# Patient Record
Sex: Female | Born: 1950 | Race: Black or African American | Hispanic: No | Marital: Single | Smoking: Former smoker
Health system: Southern US, Community
[De-identification: ages and names within clinical notes are randomized; demographics above are authoritative.]

## PROBLEM LIST (undated history)

## (undated) DIAGNOSIS — E119 Type 2 diabetes mellitus without complications: Secondary | ICD-10-CM

## (undated) DIAGNOSIS — I2510 Atherosclerotic heart disease of native coronary artery without angina pectoris: Secondary | ICD-10-CM

## (undated) DIAGNOSIS — Z973 Presence of spectacles and contact lenses: Secondary | ICD-10-CM

## (undated) DIAGNOSIS — K8020 Calculus of gallbladder without cholecystitis without obstruction: Secondary | ICD-10-CM

## (undated) DIAGNOSIS — D649 Anemia, unspecified: Secondary | ICD-10-CM

## (undated) DIAGNOSIS — G629 Polyneuropathy, unspecified: Secondary | ICD-10-CM

## (undated) DIAGNOSIS — F419 Anxiety disorder, unspecified: Secondary | ICD-10-CM

## (undated) DIAGNOSIS — I509 Heart failure, unspecified: Secondary | ICD-10-CM

## (undated) DIAGNOSIS — I429 Cardiomyopathy, unspecified: Secondary | ICD-10-CM

## (undated) DIAGNOSIS — I10 Essential (primary) hypertension: Secondary | ICD-10-CM

## (undated) DIAGNOSIS — J449 Chronic obstructive pulmonary disease, unspecified: Secondary | ICD-10-CM

## (undated) DIAGNOSIS — M14672 Charcot's joint, left ankle and foot: Secondary | ICD-10-CM

## (undated) DIAGNOSIS — G4733 Obstructive sleep apnea (adult) (pediatric): Secondary | ICD-10-CM

## (undated) DIAGNOSIS — I82409 Acute embolism and thrombosis of unspecified deep veins of unspecified lower extremity: Secondary | ICD-10-CM

## (undated) DIAGNOSIS — N186 End stage renal disease: Secondary | ICD-10-CM

## (undated) DIAGNOSIS — T4145XA Adverse effect of unspecified anesthetic, initial encounter: Secondary | ICD-10-CM

## (undated) DIAGNOSIS — A0472 Enterocolitis due to Clostridium difficile, not specified as recurrent: Secondary | ICD-10-CM

## (undated) DIAGNOSIS — Z95810 Presence of automatic (implantable) cardiac defibrillator: Secondary | ICD-10-CM

## (undated) HISTORY — PX: CORONARY ANGIOPLASTY: SHX604

---

## 2016-02-23 LAB — BASIC METABOLIC PANEL
BUN: 43 mg/dL — AB (ref 4–21)
CREATININE: 1.8 mg/dL — AB (ref 0.5–1.1)
Glucose: 117 mg/dL
POTASSIUM: 3.9 mmol/L (ref 3.4–5.3)
Sodium: 141 mmol/L (ref 137–147)

## 2016-02-24 LAB — CBC AND DIFFERENTIAL
HEMATOCRIT: 26 % — AB (ref 36–46)
Hemoglobin: 8.1 g/dL — AB (ref 12.0–16.0)
Platelets: 248 10*3/uL (ref 150–399)
WBC: 7.2 10^3/mL

## 2016-02-24 LAB — BASIC METABOLIC PANEL
BUN: 40 mg/dL — AB (ref 4–21)
Creatinine: 2 mg/dL — AB (ref 0.5–1.1)
GLUCOSE: 109 mg/dL
POTASSIUM: 3.7 mmol/L (ref 3.4–5.3)
SODIUM: 140 mmol/L (ref 137–147)

## 2016-03-02 ENCOUNTER — Encounter: Payer: Self-pay | Admitting: Internal Medicine

## 2016-03-02 ENCOUNTER — Non-Acute Institutional Stay (SKILLED_NURSING_FACILITY): Payer: Medicare PPO | Admitting: Internal Medicine

## 2016-03-02 DIAGNOSIS — R319 Hematuria, unspecified: Secondary | ICD-10-CM | POA: Diagnosis not present

## 2016-03-02 DIAGNOSIS — R5381 Other malaise: Secondary | ICD-10-CM | POA: Diagnosis not present

## 2016-03-02 DIAGNOSIS — N179 Acute kidney failure, unspecified: Secondary | ICD-10-CM

## 2016-03-02 DIAGNOSIS — I82401 Acute embolism and thrombosis of unspecified deep veins of right lower extremity: Secondary | ICD-10-CM | POA: Diagnosis not present

## 2016-03-02 DIAGNOSIS — E119 Type 2 diabetes mellitus without complications: Secondary | ICD-10-CM | POA: Diagnosis not present

## 2016-03-02 DIAGNOSIS — I5022 Chronic systolic (congestive) heart failure: Secondary | ICD-10-CM | POA: Insufficient documentation

## 2016-03-02 DIAGNOSIS — I255 Ischemic cardiomyopathy: Secondary | ICD-10-CM | POA: Diagnosis not present

## 2016-03-02 DIAGNOSIS — I2510 Atherosclerotic heart disease of native coronary artery without angina pectoris: Secondary | ICD-10-CM | POA: Insufficient documentation

## 2016-03-02 DIAGNOSIS — E669 Obesity, unspecified: Secondary | ICD-10-CM | POA: Diagnosis not present

## 2016-03-02 DIAGNOSIS — N611 Abscess of the breast and nipple: Secondary | ICD-10-CM | POA: Diagnosis not present

## 2016-03-02 DIAGNOSIS — R600 Localized edema: Secondary | ICD-10-CM

## 2016-03-02 DIAGNOSIS — I10 Essential (primary) hypertension: Secondary | ICD-10-CM | POA: Diagnosis not present

## 2016-03-02 DIAGNOSIS — E1169 Type 2 diabetes mellitus with other specified complication: Secondary | ICD-10-CM | POA: Insufficient documentation

## 2016-03-02 NOTE — Progress Notes (Signed)
LOCATION: [facility]  PCP: No primary care provider on file.   Code Status: Full Code  Goals of care: Advanced Directive information No flowsheet data found.   No emergency contact information on file.   Allergies  Allergen Reactions  . Entresto [Sacubitril-Valsartan]     Chief Complaint  Patient presents with  . New Admit To SNF    New Admission     HPI:  Patient is a 65 y.o. female seen today for short term rehabilitation post hospital admission from 02/06/16-02/27/16 with CHF exacerbation. She was diuresed following which she had acute kidney injury and became hypotensive. Her echocardiogram showed EF of 10-15%. She required emergent dialysis in the hospital with nephrology involved. She had a vascath placed for dialysis. She had hematuria and urology was consulted and this was thought to be from trauma from foley. She required pressor support for low blood pressure. She was seen by palliative care in the hospital with her overall poor prognosis. She had her palvix and aspirin held with hematuria. Later she was noted to have left arm swelling and DVT was ruled out. 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 She was started on coumadin. She also had abscess of right breast and underwent I&D. She was started on antibiotics. She is seen in her room today.   Review of Systems:  Constitutional: Negative for fever, chills, diaphoresis. Feels weak and tired.  HENT: Negative for headache, congestion, nasal discharge, sore throat, difficulty swallowing.   Eyes: Negative for blurred vision, double vision and discharge.  Respiratory: Negative for wheezing. Positive for dry cough and shortness of breath when lying down and with minimal exertion.   Cardiovascular: Negative for chest pain. Positive for ocassional palpitations and leg swelling.   Gastrointestinal: Negative for heartburn, nausea, vomiting, abdominal pain. Positive for stomach feeling tight and bloated. Last bowel  movement was last night.  Genitourinary: Negative for dysuria and flank pain.  Musculoskeletal: Negative for back pain, fall.  Skin: Negative for itching, rash.  Neurological: Negative for dizziness. Psychiatric/Behavioral: Negative for depression  PMH- DM, HLD, obesity, OSA, CAD, systolic CHF, moderate pulmonary HTN  PSH- reviewed, see discharge paperwork  History reviewed. No pertinent family history.  Medications:   Medication List       This list is accurate as of: 03/02/16 12:15 PM.  Always use your most recent med list.               albuterol 108 (90 Base) MCG/ACT inhaler  Commonly known as:  PROVENTIL HFA;VENTOLIN HFA  Inhale 2 puffs into the lungs 4 (four) times daily as needed for wheezing or shortness of breath.     atorvastatin 10 MG tablet  Commonly known as:  LIPITOR  Take 10 mg by mouth daily.     beclomethasone 80 MCG/ACT inhaler  Commonly known as:  QVAR  Inhale 2 puffs into the lungs 2 (two) times daily.     carvedilol 12.5 MG tablet  Commonly known as:  COREG  Take 12.5 mg by mouth 2 (two) times daily.     clindamycin 300 MG capsule  Commonly known as:  CLEOCIN  Take 300 mg by mouth every 6 (six) hours.     FIBER CHOICE 1.5 g Chew  Generic drug:  Inulin  Chew 2 tablets by mouth daily as needed.     insulin glargine 100 UNIT/ML injection  Commonly known as:  LANTUS  Inject 18 Units into the skin at bedtime.  torsemide 100 MG tablet  Commonly known as:  DEMADEX  Take 100 mg by mouth daily.     torsemide 100 MG tablet  Commonly known as:  DEMADEX  Take 50 mg by mouth. Every afternoon     warfarin 5 MG tablet  Commonly known as:  COUMADIN  Take 5 mg by mouth daily.        Immunizations: Immunization History  Administered Date(s) Administered  . PPD Test 03/01/2016     Physical Exam: Filed Vitals:   03/02/16 1136  BP: 119/69  Pulse: 83  Temp: 98.4 F (36.9 C)  TempSrc: Oral  Resp: 20  SpO2: 97%    General- elderly  female, morbidly obese, in no acute distress, short of breath in between sentences Head- normocephalic, atraumatic Nose-  no maxillary or frontal sinus tenderness, no nasal discharge Throat- moist mucus membrane, upper dentures, poor lower dentition Eyes- PERRLA, EOMI, no pallor, no icterus, no discharge, normal conjunctiva, normal sclera Neck- no cervical lymphadenopathy Cardiovascular- normal s1,s2, no murmur, 2+ leg edema Respiratory- bilateral poor air entry, no wheeze, no rhonchi, no crackles, no use of accessory muscles Abdomen- bowel sounds present, soft, non tender Musculoskeletal- able to move all 4 extremities, generalized weakness Neurological- alert and oriented to person, place and time Skin- warm and dry, dressing to lower inner quadrant of right breast appears clean and dry Psychiatry- normal mood and affect    Labs reviewed: Basic Metabolic Panel:  Recent Labs  16/10/96 02/24/16  NA 141 140  K 3.9 3.7  BUN 43* 40*  CREATININE 1.8* 2.0*    CBC:  Recent Labs  02/24/16  WBC 7.2  HGB 8.1*  HCT 26*  PLT 248    Radiological Exams: 3/17 US Extremity Non Vasc LTD Impression: 1. Developing subcutaneous abscess in the right anterior chest/upper abdominal wall underneath the medial right breast measuring 3.6 x 2.9 x 1.8 cm.   Chest Xray Shows AICD , a right central line present and without evidence of pneumothorax.  Ultrasound of abdomen, shows distended IVC and hepatic veins secondary to cardiac disease.  Transthoracic echocardiogram, shows moderate to severely reduced left ventricular systolic function, ejection fraction of 22% with right ventricular enlargement, left atrial enlargement, right atrial enlargement, severe TR and mild to moderate MR.  Assessment/Plan  Physical deconditioning Will have her work with physical therapy and occupational therapy team to help with gait training and muscle strengthening exercises.fall precautions. Skin care. Encourage  to be out of bed.   Systolic CHF exacerbation S/p diuresis. Continue demadex 100 mg in am and 50 mg in pm. Continue coreg 12.5 mg bid. Monitor daily weight  Leg edema Add ted hose to help with edema  Right breast abscess S/p Incision and drainage. Continue clindamycin 300 mg qid x 1 week. Monitor cbc with diff and temp curve  Hematuria Off aspirin and plavix for now. Will need urology follow up appointment  Acute renal impairment With cardiorenal syndrome. S/p dialysis in hospital. Monitor bmp  Right IJ thrombosis Continue coumadin. INR goal inr 2-3  HTN Stable bp, monitor bp bid for now. Continue coreg  Ischemic cardiomyopathy S/p AICD. Continue coreg and torsemide.  DM Check a1c. Monitor cbg tid with meals and at bedtime. Continue lantus and add novolog 5 u tid with meals for cbg > 180  CAD Remains chest pain free. Continue coreg, statin and monitor   Goals of care: short term rehabilitation   Labs/tests ordered: cbc, cmp  Family/ staff Communication: reviewed care plan  with patient and nursing supervisor    Blanchie Serve, MD Internal Medicine [place], [place] [address] Cell Phone (Monday-Friday 8 am - 5 pm): [phone] On Call: [phone] and follow prompts after 5 pm and on weekends Office Phone: [phone] Office Fax: [phone]

## 2016-03-03 ENCOUNTER — Emergency Department (HOSPITAL_COMMUNITY)
Admission: EM | Admit: 2016-03-03 | Discharge: 2016-03-04 | Disposition: A | Discharge status: Home / Self Care | Payer: Medicare PPO | Attending: Emergency Medicine | Admitting: Emergency Medicine

## 2016-03-03 ENCOUNTER — Encounter (HOSPITAL_COMMUNITY): Payer: Self-pay | Admitting: Emergency Medicine

## 2016-03-03 DIAGNOSIS — Z7901 Long term (current) use of anticoagulants: Secondary | ICD-10-CM | POA: Diagnosis not present

## 2016-03-03 DIAGNOSIS — Z79899 Other long term (current) drug therapy: Secondary | ICD-10-CM | POA: Diagnosis not present

## 2016-03-03 DIAGNOSIS — I5022 Chronic systolic (congestive) heart failure: Secondary | ICD-10-CM | POA: Insufficient documentation

## 2016-03-03 DIAGNOSIS — Z794 Long term (current) use of insulin: Secondary | ICD-10-CM | POA: Diagnosis not present

## 2016-03-03 DIAGNOSIS — Z7951 Long term (current) use of inhaled steroids: Secondary | ICD-10-CM | POA: Insufficient documentation

## 2016-03-03 DIAGNOSIS — K219 Gastro-esophageal reflux disease without esophagitis: Secondary | ICD-10-CM | POA: Insufficient documentation

## 2016-03-03 DIAGNOSIS — J441 Chronic obstructive pulmonary disease with (acute) exacerbation: Secondary | ICD-10-CM | POA: Diagnosis not present

## 2016-03-03 DIAGNOSIS — I10 Essential (primary) hypertension: Secondary | ICD-10-CM | POA: Diagnosis not present

## 2016-03-03 DIAGNOSIS — Z87891 Personal history of nicotine dependence: Secondary | ICD-10-CM | POA: Diagnosis not present

## 2016-03-03 DIAGNOSIS — Z792 Long term (current) use of antibiotics: Secondary | ICD-10-CM | POA: Insufficient documentation

## 2016-03-03 DIAGNOSIS — I2510 Atherosclerotic heart disease of native coronary artery without angina pectoris: Secondary | ICD-10-CM | POA: Diagnosis not present

## 2016-03-03 DIAGNOSIS — R12 Heartburn: Secondary | ICD-10-CM | POA: Diagnosis present

## 2016-03-03 HISTORY — DX: Chronic obstructive pulmonary disease, unspecified: J44.9

## 2016-03-03 HISTORY — DX: Atherosclerotic heart disease of native coronary artery without angina pectoris: I25.10

## 2016-03-03 HISTORY — DX: Heart failure, unspecified: I50.9

## 2016-03-03 HISTORY — DX: Essential (primary) hypertension: I10

## 2016-03-03 NOTE — ED Notes (Signed)
PER [facility]: Patient to ED from [facility] c/o increased SOB, productive cough, and heartburn/indigestion off and on today. Patient was discharged from [facility] in [place] Wednesday following a 3 week admission for CHF exacerbation and R breast mass, in which she's on antibiotics for currently. EMS was called out to SNF after pt c/o burning sensation, indigestion, and emesis x 1 from eating a hotdog. Pt was receiving 3-4 neb treatments/day at [facility]. Pt received 325 ASA PTA. EMS gave total 10mg  Albuterol, 0.5mg  Atrovent, 125mg  Solumedrol, and 4mg  zofran. EMS VS: 114/76, HR 76 NSR, 97% on 8L O2. Pt has significant hx: CHF, COPD, OSA, CAD. Pt A&O x 4.

## 2016-03-04 ENCOUNTER — Emergency Department (HOSPITAL_COMMUNITY): Payer: Medicare PPO

## 2016-03-04 DIAGNOSIS — K219 Gastro-esophageal reflux disease without esophagitis: Secondary | ICD-10-CM | POA: Diagnosis not present

## 2016-03-04 LAB — CBC WITH DIFFERENTIAL/PLATELET
Basophils Absolute: 0 10*3/uL (ref 0.0–0.1)
Basophils Relative: 1 %
EOS PCT: 1 %
Eosinophils Absolute: 0.1 10*3/uL (ref 0.0–0.7)
HCT: 28.4 % — ABNORMAL LOW (ref 36.0–46.0)
Hemoglobin: 8.7 g/dL — ABNORMAL LOW (ref 12.0–15.0)
LYMPHS ABS: 1.1 10*3/uL (ref 0.7–4.0)
LYMPHS PCT: 15 %
MCH: 26.9 pg (ref 26.0–34.0)
MCHC: 30.6 g/dL (ref 30.0–36.0)
MCV: 87.9 fL (ref 78.0–100.0)
MONOS PCT: 7 %
Monocytes Absolute: 0.5 10*3/uL (ref 0.1–1.0)
Neutro Abs: 5.7 10*3/uL (ref 1.7–7.7)
Neutrophils Relative %: 76 %
PLATELETS: 298 10*3/uL (ref 150–400)
RBC: 3.23 MIL/uL — AB (ref 3.87–5.11)
RDW: 19.8 % — ABNORMAL HIGH (ref 11.5–15.5)
WBC: 7.5 10*3/uL (ref 4.0–10.5)

## 2016-03-04 LAB — COMPREHENSIVE METABOLIC PANEL
ALK PHOS: 207 U/L — AB (ref 38–126)
ALT: 15 U/L (ref 14–54)
AST: 43 U/L — ABNORMAL HIGH (ref 15–41)
Albumin: 2.9 g/dL — ABNORMAL LOW (ref 3.5–5.0)
Anion gap: 11 (ref 5–15)
BUN: 31 mg/dL — ABNORMAL HIGH (ref 6–20)
CALCIUM: 9 mg/dL (ref 8.9–10.3)
CO2: 27 mmol/L (ref 22–32)
CREATININE: 1.49 mg/dL — AB (ref 0.44–1.00)
Chloride: 105 mmol/L (ref 101–111)
GFR, EST AFRICAN AMERICAN: 41 mL/min — AB (ref >60)
GFR, EST NON AFRICAN AMERICAN: 36 mL/min — AB (ref >60)
Glucose, Bld: 139 mg/dL — ABNORMAL HIGH (ref 65–99)
Potassium: 4.7 mmol/L (ref 3.5–5.1)
Sodium: 143 mmol/L (ref 135–145)
Total Bilirubin: 2.3 mg/dL — ABNORMAL HIGH (ref 0.3–1.2)
Total Protein: 6.9 g/dL (ref 6.5–8.1)

## 2016-03-04 LAB — I-STAT TROPONIN, ED: TROPONIN I, POC: 0.04 ng/mL (ref 0.00–0.08)

## 2016-03-04 LAB — LIPASE, BLOOD: LIPASE: 44 U/L (ref 11–51)

## 2016-03-04 MED ORDER — OMEPRAZOLE 20 MG PO CPDR: 20.0000 mg | DELAYED_RELEASE_CAPSULE | Freq: Every day | ORAL | Status: DC

## 2016-03-04 NOTE — ED Provider Notes (Signed)
CSN: [id number]     Arrival date & time 03/03/16  2310 History   First MD Initiated Contact with Patient 03/04/16 0001     Chief Complaint  Patient presents with  . Shortness of Breath  . Heartburn     (Consider location/radiation/quality/duration/timing/severity/associated sxs/prior Treatment) HPI   Patient with PMH of CHF, COPD and hypertension coming from SNF at [facility] for evaluation of having heartburn, belching, coughing after eating a hot dog frank. She states that she normal doesn't eat that stuff and since then she has been burping up "pork rind" taste. She was recently discharged from [facility] where she stayed for 3 weeks after a complicated stay for CHF and breast abscess. She is on abx. They gave her a breathing treatment, solumedrol, zofran and aspirin en route. She is sitting comfortably. Denies having any further discomfort aside from belching. She speaks in full sentences with O2 sats at 98% on room air.  Past Medical History  Diagnosis Date  . Coronary artery disease   . CHF (congestive heart failure) ([place])   . COPD (chronic obstructive pulmonary disease) ([place])   . Hypertension    No past surgical history on file. No family history on file. Social History  Substance Use Topics  . Smoking status: Former Smoker    Types: Cigarettes    Quit date: 03/03/2006  . Smokeless tobacco: Never Used  . Alcohol Use: No   OB History    No data available     Review of Systems  Review of Systems All other systems negative except as documented in the HPI. All pertinent positives and negatives as reviewed in the HPI.   Allergies  Entresto  Home Medications   Prior to Admission medications   Medication Sig Start Date End Date Taking? Authorizing Provider  albuterol (PROVENTIL HFA;VENTOLIN HFA) 108 (90 Base) MCG/ACT inhaler Inhale 2 puffs into the lungs 4 (four) times daily as needed for wheezing or shortness of breath.   Yes Historical Provider, MD   atorvastatin (LIPITOR) 10 MG tablet Take 10 mg by mouth daily.   Yes Historical Provider, MD  beclomethasone (QVAR) 80 MCG/ACT inhaler Inhale 2 puffs into the lungs 2 (two) times daily.   Yes Historical Provider, MD  carvedilol (COREG) 12.5 MG tablet Take 12.5 mg by mouth 2 (two) times daily.   Yes Historical Provider, MD  clindamycin (CLEOCIN) 300 MG capsule Take 300 mg by mouth every 6 (six) hours.   Yes Historical Provider, MD  insulin glargine (LANTUS) 100 UNIT/ML injection Inject 18 Units into the skin at bedtime.   Yes Historical Provider, MD  insulin lispro (HUMALOG) 100 UNIT/ML injection Inject 5 Units into the skin 3 (three) times daily before meals. For CBG > 180   Yes Historical Provider, MD  Inulin (FIBER CHOICE) 1.5 g CHEW Chew 2 tablets by mouth daily as needed (for constipation).    Yes Historical Provider, MD  torsemide (DEMADEX) 100 MG tablet Take 50-100 mg by mouth 2 (two) times daily. 100 mg every morning and 50 mg Every afternoon   Yes Historical Provider, MD  warfarin (COUMADIN) 5 MG tablet Take 5 mg by mouth daily.   Yes Historical Provider, MD  omeprazole (PRILOSEC) 20 MG capsule Take 1 capsule (20 mg total) by mouth daily. 03/04/16   Sharry Beining Carlota Raspberry, PA-C   BP 102/65 mmHg  Pulse 66  Temp(Src) 98.2 F (36.8 C) (Oral)  Resp 25  Ht _0  (1.626 m)  Wt 117.935 kg  BMI 44.61 kg/m2  SpO2 98% Physical Exam  Constitutional: She appears well-developed and well-nourished. No distress.  HENT:  Head: Normocephalic and atraumatic.  Right Ear: Tympanic membrane and ear canal normal.  Left Ear: Tympanic membrane and ear canal normal.  Nose: Nose normal.  Mouth/Throat: Uvula is midline, oropharynx is clear and moist and mucous membranes are normal.  Eyes: Pupils are equal, round, and reactive to light.  Neck: Normal range of motion. Neck supple.  Cardiovascular: Normal rate and regular rhythm.   Pulmonary/Chest: Effort normal and breath sounds normal. No accessory muscle usage.  No respiratory distress. She has no decreased breath sounds.  Abdominal: Soft. Bowel sounds are normal. There is no tenderness. There is no rigidity and no guarding.  No signs of abdominal distention  Musculoskeletal:  No LE swelling  Neurological: She is alert.  Acting at baseline  Skin: Skin is warm and dry. No rash noted.  Nursing note and vitals reviewed.   ED Course  Procedures (including critical care time) Labs Review Labs Reviewed  CBC WITH DIFFERENTIAL/PLATELET - Abnormal; Notable for the following:    RBC 3.23 (*)    Hemoglobin 8.7 (*)    HCT 28.4 (*)    RDW 19.8 (*)    All other components within normal limits  COMPREHENSIVE METABOLIC PANEL - Abnormal; Notable for the following:    Glucose, Bld 139 (*)    BUN 31 (*)    Creatinine, Ser 1.49 (*)    Albumin 2.9 (*)    AST 43 (*)    Alkaline Phosphatase 207 (*)    Total Bilirubin 2.3 (*)    GFR calc non Af Amer 36 (*)    GFR calc Af Amer 41 (*)    All other components within normal limits  LIPASE, BLOOD  I-STAT TROPOININ, ED    Imaging Review Dg Chest 2 View  03/04/2016  CLINICAL DATA:  Shortness of breath and productive cough. Recent hospital admission, discharged 2 days prior at an outside facility. EXAM: CHEST  2 VIEW COMPARISON:  None. FINDINGS: Single lead pacemaker in place. There is marked cardiomegaly. Small bilateral pleural effusions. Vascular congestion and question of minimal perihilar edema. No confluent airspace disease. Chronic deformity of both shoulders. Body habitus limits evaluation. IMPRESSION: Cardiomegaly with bilateral pleural effusions, vascular congestion and question perihilar edema. Findings consistent with CHF. No prior exams for comparison. Electronically Signed   By: Jeb Levering M.D.   On: 03/04/2016 01:00   I have personally reviewed and evaluated these images and lab results as part of my medical decision-making.   EKG Interpretation   Date/Time:  Friday March 03 2016 23:36:14  EDT Ventricular Rate:  73 PR Interval:  154 QRS Duration: 88 QT Interval:  407 QTC Calculation: 448 R Axis:   93 Text Interpretation:  Sinus rhythm Low voltage, extremity and precordial  leads Consider anterior infarct Nonspecific T abnormalities, lateral leads  Confirmed by BEATON  MD, ROBERT ([id number]) on 03/04/2016 12:05:56 AM      MDM   Final diagnoses:  Gastroesophageal reflux disease, esophagitis presence not specified  Chronic systolic congestive heart failure (HCC)    Dr. Audie Pinto has seen the patient as well. The plan is to check EKG (which is non acute), chest xray, blood work and a Troponin. If all of her labs are normal and she continues to look and feel well then she can be discharged back to the facility with PPI medication.  The patient has a negative Troponin. She brought  lab work from her visit on 3/24 to which her BUN has improved from 94, her creatinine has improved from 3.35, alk phos 140. Patient is requesting to leave as she feels better and feels that this is heart burn. With her labs being stable and she feels well, will dc with follow-up by the PCP. Dr. Audie Pinto agrees with this plan.  Delos Haring, PA-C 03/04/16 0131  Leonard Schwartz, MD 03/11/16 [phone]

## 2016-03-04 NOTE — Discharge Instructions (Signed)
Heartburn °Heartburn is a type of pain or discomfort that can happen in the throat or chest. It is often described as a burning pain. It may also cause a bad taste in the mouth. Heartburn may feel worse when you lie down or bend over, and it is often worse at night. Heartburn may be caused by stomach contents that move back up into the esophagus (reflux). °HOME CARE INSTRUCTIONS °Take these actions to decrease your discomfort and to help avoid complications. °Diet °· Follow a diet as recommended by your health care provider. This may involve avoiding foods and drinks such as: °¨ Coffee and tea (with or without caffeine). °¨ Drinks that contain alcohol. °¨ Energy drinks and sports drinks. °¨ Carbonated drinks or sodas. °¨ Chocolate and cocoa. °¨ Peppermint and mint flavorings. °¨ Garlic and onions. °¨ Horseradish. °¨ Spicy and acidic foods, including peppers, chili powder, curry powder, vinegar, hot sauces, and barbecue sauce. °¨ Citrus fruit juices and citrus fruits, such as oranges, lemons, and limes. °¨ Tomato-based foods, such as red sauce, chili, salsa, and pizza with red sauce. °¨ Fried and fatty foods, such as donuts, french fries, potato chips, and high-fat dressings. °¨ High-fat meats, such as hot dogs and fatty cuts of red and white meats, such as rib eye steak, sausage, ham, and bacon. °¨ High-fat dairy items, such as whole milk, butter, and cream cheese. °· Eat small, frequent meals instead of large meals. °· Avoid drinking large amounts of liquid with your meals. °· Avoid eating meals during the 2-3 hours before bedtime. °· Avoid lying down right after you eat. °· Do not exercise right after you eat. ° General Instructions  °· Pay attention to any changes in your symptoms. °· Take over-the-counter and prescription medicines only as told by your health care provider. Do not take aspirin, ibuprofen, or other NSAIDs unless your health care provider told you to do so. °· Do not use any tobacco products,  including cigarettes, chewing tobacco, and e-cigarettes. If you need help quitting, ask your health care provider. °· Wear loose-fitting clothing. Do not wear anything tight around your waist that causes pressure on your abdomen. °· Raise (elevate) the head of your bed about 6 inches (15 cm). °· Try to reduce your stress, such as with yoga or meditation. If you need help reducing stress, ask your health care provider. °· If you are overweight, reduce your weight to an amount that is healthy for you. Ask your health care provider for guidance about a safe weight loss goal. °· Keep all follow-up visits as told by your health care provider. This is important. °SEEK MEDICAL CARE IF: °· You have new symptoms. °· You have unexplained weight loss. °· You have difficulty swallowing, or it hurts to swallow. °· You have wheezing or a persistent cough. °· Your symptoms do not improve with treatment. °· You have frequent heartburn for more than two weeks. °SEEK IMMEDIATE MEDICAL CARE IF: °· You have pain in your arms, neck, jaw, teeth, or back. °· You feel sweaty, dizzy, or light-headed. °· You have chest pain or shortness of breath. °· You vomit and your vomit looks like blood or coffee grounds. °· Your stool is bloody or black. °  °This information is not intended to replace advice given to you by your health care provider. Make sure you discuss any questions you have with your health care provider. °  °Document Released: 04/01/2009 Document Revised: 08/04/2015 Document Reviewed: 03/10/2015 °Elsevier Interactive Patient Education ©2016 [employer] ° °

## 2016-03-04 NOTE — ED Notes (Signed)
PA-C at bedside 

## 2016-03-04 NOTE — ED Notes (Signed)
Patient verbalized understanding of discharge instructions and denies any further needs or questions at this time. VS stable. Patient ambulatory with steady gait. Awaiting [facility] to take patient back to [facility].

## 2016-03-14 ENCOUNTER — Emergency Department (HOSPITAL_COMMUNITY)
Admission: EM | Admit: 2016-03-14 | Discharge: 2016-03-14 | Disposition: A | Discharge status: Home / Self Care | Payer: Medicare PPO | Attending: Emergency Medicine | Admitting: Emergency Medicine

## 2016-03-14 ENCOUNTER — Encounter (HOSPITAL_COMMUNITY): Payer: Self-pay

## 2016-03-14 ENCOUNTER — Emergency Department (HOSPITAL_COMMUNITY): Payer: Medicare PPO

## 2016-03-14 DIAGNOSIS — Z79899 Other long term (current) drug therapy: Secondary | ICD-10-CM | POA: Insufficient documentation

## 2016-03-14 DIAGNOSIS — N289 Disorder of kidney and ureter, unspecified: Secondary | ICD-10-CM | POA: Insufficient documentation

## 2016-03-14 DIAGNOSIS — Z87891 Personal history of nicotine dependence: Secondary | ICD-10-CM | POA: Diagnosis not present

## 2016-03-14 DIAGNOSIS — Z7901 Long term (current) use of anticoagulants: Secondary | ICD-10-CM | POA: Diagnosis not present

## 2016-03-14 DIAGNOSIS — I2510 Atherosclerotic heart disease of native coronary artery without angina pectoris: Secondary | ICD-10-CM | POA: Diagnosis not present

## 2016-03-14 DIAGNOSIS — E669 Obesity, unspecified: Secondary | ICD-10-CM | POA: Diagnosis not present

## 2016-03-14 DIAGNOSIS — I10 Essential (primary) hypertension: Secondary | ICD-10-CM | POA: Diagnosis not present

## 2016-03-14 DIAGNOSIS — I5022 Chronic systolic (congestive) heart failure: Secondary | ICD-10-CM | POA: Diagnosis not present

## 2016-03-14 DIAGNOSIS — R0602 Shortness of breath: Secondary | ICD-10-CM | POA: Diagnosis present

## 2016-03-14 DIAGNOSIS — J449 Chronic obstructive pulmonary disease, unspecified: Secondary | ICD-10-CM | POA: Diagnosis not present

## 2016-03-14 DIAGNOSIS — J90 Pleural effusion, not elsewhere classified: Secondary | ICD-10-CM | POA: Insufficient documentation

## 2016-03-14 DIAGNOSIS — Z794 Long term (current) use of insulin: Secondary | ICD-10-CM | POA: Insufficient documentation

## 2016-03-14 LAB — I-STAT CHEM 8, ED
BUN: 39 mg/dL — AB (ref 6–20)
CALCIUM ION: 1.08 mmol/L — AB (ref 1.13–1.30)
CREATININE: 2 mg/dL — AB (ref 0.44–1.00)
Chloride: 99 mmol/L — ABNORMAL LOW (ref 101–111)
Glucose, Bld: 106 mg/dL — ABNORMAL HIGH (ref 65–99)
HCT: 35 % — ABNORMAL LOW (ref 36.0–46.0)
HEMOGLOBIN: 11.9 g/dL — AB (ref 12.0–15.0)
Potassium: 3.6 mmol/L (ref 3.5–5.1)
SODIUM: 141 mmol/L (ref 135–145)
TCO2: 29 mmol/L (ref 0–100)

## 2016-03-14 LAB — BASIC METABOLIC PANEL
ANION GAP: 10 (ref 5–15)
BUN: 40 mg/dL — ABNORMAL HIGH (ref 6–20)
CALCIUM: 8.2 mg/dL — AB (ref 8.9–10.3)
CO2: 27 mmol/L (ref 22–32)
Chloride: 100 mmol/L — ABNORMAL LOW (ref 101–111)
Creatinine, Ser: 2.13 mg/dL — ABNORMAL HIGH (ref 0.44–1.00)
GFR calc Af Amer: 27 mL/min — ABNORMAL LOW (ref >60)
GFR, EST NON AFRICAN AMERICAN: 23 mL/min — AB (ref >60)
GLUCOSE: 107 mg/dL — AB (ref 65–99)
Potassium: 3.7 mmol/L (ref 3.5–5.1)
SODIUM: 137 mmol/L (ref 135–145)

## 2016-03-14 LAB — CBC WITH DIFFERENTIAL/PLATELET
BASOS ABS: 0 10*3/uL (ref 0.0–0.1)
BASOS PCT: 0 %
EOS ABS: 0.2 10*3/uL (ref 0.0–0.7)
EOS PCT: 1 %
HCT: 30.2 % — ABNORMAL LOW (ref 36.0–46.0)
Hemoglobin: 9.3 g/dL — ABNORMAL LOW (ref 12.0–15.0)
Lymphocytes Relative: 5 %
Lymphs Abs: 0.6 10*3/uL — ABNORMAL LOW (ref 0.7–4.0)
MCH: 27.5 pg (ref 26.0–34.0)
MCHC: 30.8 g/dL (ref 30.0–36.0)
MCV: 89.3 fL (ref 78.0–100.0)
MONO ABS: 1.2 10*3/uL — AB (ref 0.1–1.0)
Monocytes Relative: 10 %
Neutro Abs: 11 10*3/uL — ABNORMAL HIGH (ref 1.7–7.7)
Neutrophils Relative %: 84 %
PLATELETS: 136 10*3/uL — AB (ref 150–400)
RBC: 3.38 MIL/uL — AB (ref 3.87–5.11)
RDW: 18.2 % — AB (ref 11.5–15.5)
WBC: 13 10*3/uL — ABNORMAL HIGH (ref 4.0–10.5)

## 2016-03-14 LAB — URINE MICROSCOPIC-ADD ON

## 2016-03-14 LAB — URINALYSIS, ROUTINE W REFLEX MICROSCOPIC
GLUCOSE, UA: NEGATIVE mg/dL
KETONES UR: 15 mg/dL — AB
NITRITE: NEGATIVE
PH: 5 (ref 5.0–8.0)
Protein, ur: 30 mg/dL — AB
Specific Gravity, Urine: 1.019 (ref 1.005–1.030)

## 2016-03-14 LAB — BRAIN NATRIURETIC PEPTIDE: B NATRIURETIC PEPTIDE 5: 1509.6 pg/mL — AB (ref 0.0–100.0)

## 2016-03-14 LAB — PROTIME-INR
INR: 3.01 — ABNORMAL HIGH (ref 0.00–1.49)
PROTHROMBIN TIME: 30.7 s — AB (ref 11.6–15.2)

## 2016-03-14 LAB — I-STAT TROPONIN, ED: TROPONIN I, POC: 0.05 ng/mL (ref 0.00–0.08)

## 2016-03-14 NOTE — ED Provider Notes (Addendum)
MSE was initiated and I personally evaluated the patient and placed orders (if any) at  2:54 PM on March 14, 2016.  The patient appears stable so that the remainder of the MSE may be completed by another provider.  65 yo F with worsening shortness of breath. Sounds like her CHF is getting worse. Patient has been told to cut back on her home Lasix and she thinks that the issue. Has had worsening lower extremity edema.  EJ placement: 18 gauge IV placed in L EJ. Skin prepped with alcohol pads, L EJ identified with Valsalva. Cannulated with good return of dark, non-pulsatile blood. Tachyderm placed after easily flushed with NS.   Lab work has been initiated on the patient. Feel that she is likely a CHF exacerbation.   EKG Interpretation  Date/Time:  Tuesday March 14 2016 14:26:29 EDT Ventricular Rate:  72 PR Interval:  74 QRS Duration: 125 QT Interval:  423 QTC Calculation: 463 R Axis:   112 Text Interpretation:  Sinus rhythm Atrial premature complex Short PR interval Nonspecific intraventricular conduction delay TECHNICALLY DIFFICULT Low voltage QRS No significant change since last tracing Confirmed by Draper Gallon MD, DANIEL [phone]) on 03/14/2016 2:35:55 PM         Melene Plan, DO 03/14/16 1455

## 2016-03-14 NOTE — ED Notes (Signed)
PTAR at bedside for transport.  

## 2016-03-14 NOTE — ED Provider Notes (Signed)
CSN: [id number]     Arrival date & time 03/14/16  1415 History   First MD Initiated Contact with Patient 03/14/16 1512     Chief Complaint  Patient presents with  . Shortness of Breath     (Consider location/radiation/quality/duration/timing/severity/associated sxs/prior Treatment) HPI   Brandy Dudley is a 65 y.o. female who presents for evaluation of shortness of breath, which is present and worsening over the last 48 hours. She denies chest pain, weakness or dizziness. She is concerned that her medications are being giving at inappropriate times. She does not know what her baseline weight is. She does not know she's been weighed recently.  She denies fever, chills, cough, nausea, vomiting, weakness or dizziness. She continues to have leg swelling. According to reports from her nursing care facility, her room air oxygen saturation was 85% today. She was therefore placed on oxygen, and transferred here for evaluation. There are no other known modifying factors    Past Medical History  Diagnosis Date  . Coronary artery disease   . CHF (congestive heart failure) (HCC)   . COPD (chronic obstructive pulmonary disease) (HCC)   . Hypertension    History reviewed. No pertinent past surgical history. No family history on file. Social History  Substance Use Topics  . Smoking status: Former Smoker    Types: Cigarettes    Quit date: 03/03/2006  . Smokeless tobacco: Never Used  . Alcohol Use: No   OB History    No data available     Review of Systems  All other systems reviewed and are negative.     Allergies  Entresto  Home Medications   Prior to Admission medications   Medication Sig Start Date End Date Taking? Authorizing Provider  acetaminophen (TYLENOL) 325 MG tablet Take 650 mg by mouth every 6 (six) hours as needed.   Yes Historical Provider, MD  albuterol (PROVENTIL HFA;VENTOLIN HFA) 108 (90 Base) MCG/ACT inhaler Inhale 2 puffs into the lungs 4 (four) times daily as  needed for wheezing or shortness of breath.   Yes Historical Provider, MD  atorvastatin (LIPITOR) 10 MG tablet Take 10 mg by mouth daily.   Yes Historical Provider, MD  beclomethasone (QVAR) 80 MCG/ACT inhaler Inhale 2 puffs into the lungs 2 (two) times daily.   Yes Historical Provider, MD  carvedilol (COREG) 12.5 MG tablet Take 12.5 mg by mouth 2 (two) times daily.   Yes Historical Provider, MD  insulin glargine (LANTUS) 100 UNIT/ML injection Inject 18 Units into the skin at bedtime.   Yes Historical Provider, MD  Inulin (FIBER CHOICE) 1.5 g CHEW Chew 2 tablets by mouth daily as needed (for constipation).    Yes Historical Provider, MD  omeprazole (PRILOSEC) 20 MG capsule Take 1 capsule (20 mg total) by mouth daily. 03/04/16  Yes Tiffany Neva Seat, PA-C  torsemide (DEMADEX) 100 MG tablet Take 50-100 mg by mouth 2 (two) times daily. 100 mg every morning and 50 mg Every afternoon   Yes Historical Provider, MD  warfarin (COUMADIN) 5 MG tablet Take 5 mg by mouth daily.   Yes Historical Provider, MD  insulin lispro (HUMALOG) 100 UNIT/ML injection Inject 5 Units into the skin 3 (three) times daily before meals. For CBG > 180    Historical Provider, MD   BP 101/67 mmHg  Pulse 68  Resp 17  Wt 258 lb 6.4 oz (117.209 kg)  SpO2 100% Physical Exam  Constitutional: She is oriented to person, place, and time. She appears well-developed.  Obese, appears  older than stated age  HENT:  Head: Normocephalic and atraumatic.  Right Ear: External ear normal.  Left Ear: External ear normal.  Oral mucous membranes are dry  Eyes: Conjunctivae and EOM are normal. Pupils are equal, round, and reactive to light.  Neck: Normal range of motion and phonation normal. Neck supple.  Cardiovascular: Normal rate, regular rhythm and normal heart sounds.   Pulmonary/Chest: Effort normal. No respiratory distress. She has no wheezes. She exhibits no tenderness and no bony tenderness.  Rales bilaterally halfway up. Decreased air  movement bilaterally.  Abdominal: Soft. There is no tenderness.  Musculoskeletal: Normal range of motion. She exhibits edema (3+ lower legs, bilaterally without erythema, or deformity).  Neurological: She is alert and oriented to person, place, and time. No cranial nerve deficit or sensory deficit. She exhibits normal muscle tone. Coordination normal.  Skin: Skin is warm, dry and intact.  Psychiatric: She has a normal mood and affect. Her behavior is normal. Judgment and thought content normal.  Nursing note and vitals reviewed.   ED Course  Procedures (including critical care time) Initial clinical impression- likely CHF exacerbation. Baseline weight is unclear. We'll evaluate with imaging, labs, and then reassess.  Medications - No data to display  Patient Vitals for the past 24 hrs:  BP Pulse Resp SpO2 Weight  03/14/16 1815 101/67 mmHg 68 17 100 % -  03/14/16 1710 - - - - 258 lb 6.4 oz (117.209 kg)  03/14/16 1700 114/64 mmHg 70 18 100 % -  03/14/16 1600 98/63 mmHg 69 18 100 % -  03/14/16 1445 107/74 mmHg 72 20 100 % -   Weight today 117.2 kg, down from 117.9, on 03/03/2016; down 0.7 kg  O2 removed at 18:23   18:45-  I discussed the case with Dr. Florencia Reasons, who is responsible for her care at [facility], where she currently lives. The patient is showing trend towards improvement with current treatment. She is maintaining oxygenation normally on room air. There is no indication for hospitalization at this time, and Dr. Lyn Hollingshead will arrange follow-up treatment in her facility to improve her status. Dr. Lyn Hollingshead is considering starting her on digoxin, to improve cardiac performance. She will also duration of the patient seen in the heart failure clinic.  7:23 PM Reevaluation with update and discussion. After initial assessment and treatment, an updated evaluation reveals  She remains comfortable, with normal oxygenation on room air of 97%. [name]    Labs Review Labs  Reviewed  BRAIN NATRIURETIC PEPTIDE - Abnormal; Notable for the following:    B Natriuretic Peptide 1509.6 (*)    All other components within normal limits  CBC WITH DIFFERENTIAL/PLATELET - Abnormal; Notable for the following:    WBC 13.0 (*)    RBC 3.38 (*)    Hemoglobin 9.3 (*)    HCT 30.2 (*)    RDW 18.2 (*)    Platelets 136 (*)    Neutro Abs 11.0 (*)    Lymphs Abs 0.6 (*)    Monocytes Absolute 1.2 (*)    All other components within normal limits  BASIC METABOLIC PANEL - Abnormal; Notable for the following:    Chloride 100 (*)    Glucose, Bld 107 (*)    BUN 40 (*)    Creatinine, Ser 2.13 (*)    Calcium 8.2 (*)    GFR calc non Af Amer 23 (*)    GFR calc Af Amer 27 (*)    All other components within normal  limits  PROTIME-INR - Abnormal; Notable for the following:    Prothrombin Time 30.7 (*)    INR 3.01 (*)    All other components within normal limits  URINALYSIS, ROUTINE W REFLEX MICROSCOPIC (NOT AT [facility]) - Abnormal; Notable for the following:    Color, Urine AMBER (*)    APPearance CLOUDY (*)    Hgb urine dipstick LARGE (*)    Bilirubin Urine SMALL (*)    Ketones, ur 15 (*)    Protein, ur 30 (*)    Leukocytes, UA TRACE (*)    All other components within normal limits  URINE MICROSCOPIC-ADD ON - Abnormal; Notable for the following:    Squamous Epithelial / LPF 0-5 (*)    Bacteria, UA MANY (*)    Casts HYALINE CASTS (*)    All other components within normal limits  I-STAT CHEM 8, ED - Abnormal; Notable for the following:    Chloride 99 (*)    BUN 39 (*)    Creatinine, Ser 2.00 (*)    Glucose, Bld 106 (*)    Calcium, Ion 1.08 (*)    Hemoglobin 11.9 (*)    HCT 35.0 (*)    All other components within normal limits  URINE CULTURE  URINE CULTURE  URINALYSIS, ROUTINE W REFLEX MICROSCOPIC (NOT AT [facility])  I-STAT TROPOININ, ED   Component     Latest Ref Rng 03/04/2016 03/14/2016          2:57 PM  WBC     4.0 - 10.5 K/uL 7.5 13.0 (H)  RBC     3.87 - 5.11 MIL/uL 3.23  (L) 3.38 (L)  Hemoglobin     12.0 - 15.0 g/dL 8.7 (L) 9.3 (L)  HCT     36.0 - 46.0 % 28.4 (L) 30.2 (L)  MCV     78.0 - 100.0 fL 87.9 89.3  MCH     26.0 - 34.0 pg 26.9 27.5  MCHC     30.0 - 36.0 g/dL 16.1 09.6  RDW     04.5 - 15.5 % 19.8 (H) 18.2 (H)  Platelets     150 - 400 K/uL 298 136 (L)  Neutrophils      76 84  NEUT#     1.7 - 7.7 K/uL 5.7 11.0 (H)  Lymphocytes      15 5  Lymphocyte #     0.7 - 4.0 K/uL 1.1 0.6 (L)  Monocytes Relative      7 10  Monocyte #     0.1 - 1.0 K/uL 0.5 1.2 (H)  Eosinophil      1 1  Eosinophils Absolute     0.0 - 0.7 K/uL 0.1 0.2  Basophil      1 0  Basophils Absolute     0.0 - 0.1 K/uL 0.0 0.0   Component     Latest Ref Rng 03/04/2016 03/14/2016          2:57 PM  Sodium     135 - 145 mmol/L 143 137  Potassium     3.5 - 5.1 mmol/L 4.7 3.7  Chloride     101 - 111 mmol/L 105 100 (L)  BUN     6 - 20 mg/dL 31 (H) 40 (H)  Creatinine     0.44 - 1.00 mg/dL 4.09 (H) 8.11 (H)  Glucose     65 - 99 mg/dL 914 (H) 782 (H)  Calcium Ionized     1.13 - 1.30 mmol/L  TCO2     0 - 100 mmol/L    Hemoglobin     12.0 - 15.0 g/dL 8.7 (L) 9.3 (L)  HCT     36.0 - 46.0 % 28.4 (L) 30.2 (L)   Imaging Review Dg Chest 2 View  03/14/2016  CLINICAL DATA:  Acute on chronic shortness of breath. EXAM: CHEST  2 VIEW COMPARISON:  March 04, 2016. FINDINGS: Stable cardiomegaly is noted. Single lead pacemaker is unchanged in position. No pneumothorax is noted. Mild bilateral pleural effusions are noted. Bony thorax is unremarkable. No acute pulmonary disease is noted. IMPRESSION: Mild bilateral pleural effusions.  Stable cardiomegaly. Electronically Signed   By: Lupita Raider, M.D.   On: 03/14/2016 15:23   I have personally reviewed and evaluated these images and lab results as part of my medical decision-making.   EKG Interpretation   Date/Time:  Tuesday March 14 2016 14:26:29 EDT Ventricular Rate:  72 PR Interval:  74 QRS Duration: 125 QT Interval:   423 QTC Calculation: 463 R Axis:   112 Text Interpretation:  Sinus rhythm Atrial premature complex Short PR  interval Nonspecific intraventricular conduction delay TECHNICALLY  DIFFICULT Low voltage QRS No significant change since last tracing  Confirmed by FLOYD MD, Reuel Boom ([id number]) on 03/14/2016 2:35:55 PM      MDM   Final diagnoses:  Shortness of breath  Pleural effusion  Renal insufficiency  Chronic systolic congestive heart failure (HCC)    Shortness of breath with bilateral pleural effusions. BNP is elevated without baseline for comparison. Dizziness is nonspecific. Creatinine shows significant elevation of her recent baseline, 10 days ago. Records reviews from hospitalization, in [place], [place], about 5 weeks ago. She had a two-week stay. During that time she was treated for acute kidney injury had transient hemodialysis, was diuresed, and required very close treatment, by multiple, providers. She was discharged to skilled nursing facility, and presents now for worsening symptoms with fluid overload, and AKI. Her weight is actually improved in the last 12 days, based on ED visits. She appears hemodynamically stable with good oxygenation in the emergency department.  Nursing Notes Reviewed/ Care Coordinated Applicable Imaging Reviewed Interpretation of Laboratory Data incorporated into ED treatment  The patient appears reasonably screened and/or stabilized for discharge and I doubt any other medical condition or other [facility] requiring further screening, evaluation, or treatment in the ED at this time prior to discharge.  Plan: Home Medications- usual; Home Treatments- rest; return here if the recommended treatment, does not improve the symptoms; Recommended follow up- PCP prn. They plan on treating her congestive heart failure, and follow renal function at the facility.  Mancel Bale, MD 03/14/16 2059

## 2016-03-14 NOTE — ED Notes (Signed)
Pt. BIB [facility] for evaluation of SOB x 1 day. Pt. With hx of CHF, states has had diarrhea since Friday. Pt. Denies new onset pain at this time. States chronic leg pain. Pt. Speaking in short phrases due to SOB.

## 2016-03-14 NOTE — ED Notes (Signed)
Pt given ice chips

## 2016-03-14 NOTE — Discharge Instructions (Signed)
We are prescribing oxygen 2 L/m by nasal cannula to use at night when sleeping.   Chronic Kidney Disease Chronic kidney disease happens when the kidneys are damaged over a long period. The kidneys are two organs that do many important jobs in the body. These jobs include:  Removing wastes and extra fluids from the blood.  Making hormones that help to keep the body healthy.  Making sure that the body has the right amount of fluids and chemicals. Chronic kidney disease may be caused by many things. The kidney damage occurs slowly. If too much damage occurs, the kidneys may stop working the way that they should. This is dangerous. Treatment can help to slow down the damage and keep it from getting worse. HOME CARE  Follow your diet as told by your doctor. You may need to limit the amount of salt (sodium) and protein that you eat each day.  Take medicines only as told by your doctor. Do not take any new medicines unless your doctor approves it.  Quit smoking if you smoke. Talk to your doctor about programs that may help you quit smoking.  Have your blood pressure checked regularly and keep track of the results.  Start or keep doing an exercise plan.  Get shots (immunizations) as told by your doctor.  Take vitamins and minerals as told by your doctor.  Keep all follow-up visits as told by your doctor. This is important. GET HELP RIGHT AWAY IF:   Your symptoms get worse.  You have new symptoms.  You have symptoms of end-stage kidney disease. These include:  Headaches.  Skin that is darker or lighter than normal.  Numbness in the hands or feet.  Easy bruising.  Frequent hiccups.  Stopping of menstrual periods in women.  You have a fever.  You are making very little pee (urine).  You have pain or bleeding when you pee.   This information is not intended to replace advice given to you by your health care provider. Make sure you discuss any questions you have with your  health care provider.   Document Released: 02/07/2010 Document Revised: 08/04/2015 Document Reviewed: 07/12/2012 Elsevier Interactive Patient Education 2016 [employer] Inc[employer]  Heart Failure Heart failure is a condition in which the heart has trouble pumping blood. This means your heart does not pump blood efficiently for your body to work well. In some cases of heart failure, fluid may back up into your lungs or you may have swelling (edema) in your lower legs. Heart failure is usually a long-term (chronic) condition. It is important for you to take good care of yourself and follow your health care provider's treatment plan. CAUSES  Some health conditions can cause heart failure. Those health conditions include:  High blood pressure (hypertension). Hypertension causes the heart muscle to work harder than normal. When pressure in the blood vessels is high, the heart needs to pump (contract) with more force in order to circulate blood throughout the body. High blood pressure eventually causes the heart to become stiff and weak.  Coronary artery disease (CAD). CAD is the buildup of cholesterol and fat (plaque) in the arteries of the heart. The blockage in the arteries deprives the heart muscle of oxygen and blood. This can cause chest pain and may lead to a heart attack. High blood pressure can also contribute to CAD.  Heart attack (myocardial infarction). A heart attack occurs when one or more arteries in the heart become blocked. The loss of oxygen damages the  muscle tissue of the heart. When this happens, part of the heart muscle dies. The injured tissue does not contract as well and weakens the heart's ability to pump blood.  Abnormal heart valves. When the heart valves do not open and close properly, it can cause heart failure. This makes the heart muscle pump harder to keep the blood flowing.  Heart muscle disease (cardiomyopathy or myocarditis). Heart muscle disease is damage to the heart muscle  from a variety of causes. These can include drug or alcohol abuse, infections, or unknown reasons. These can increase the risk of heart failure.  Lung disease. Lung disease makes the heart work harder because the lungs do not work properly. This can cause a strain on the heart, leading it to fail.  Diabetes. Diabetes increases the risk of heart failure. High blood sugar contributes to high fat (lipid) levels in the blood. Diabetes can also cause slow damage to tiny blood vessels that carry important nutrients to the heart muscle. When the heart does not get enough oxygen and food, it can cause the heart to become weak and stiff. This leads to a heart that does not contract efficiently.  Other conditions can contribute to heart failure. These include abnormal heart rhythms, thyroid problems, and low blood counts (anemia). Certain unhealthy behaviors can increase the risk of heart failure, including:  Being overweight.  Smoking or chewing tobacco.  Eating foods high in fat and cholesterol.  Abusing illicit drugs or alcohol.  Lacking physical activity. SYMPTOMS  Heart failure symptoms may vary and can be hard to detect. Symptoms may include:  Shortness of breath with activity, such as climbing stairs.  Persistent cough.  Swelling of the feet, ankles, legs, or abdomen.  Unexplained weight gain.  Difficulty breathing when lying flat (orthopnea).  Waking from sleep because of the need to sit up and get more air.  Rapid heartbeat.  Fatigue and loss of energy.  Feeling light-headed, dizzy, or close to fainting.  Loss of appetite.  Nausea.  Increased urination during the night (nocturia). DIAGNOSIS  A diagnosis of heart failure is based on your history, symptoms, physical examination, and diagnostic tests. Diagnostic tests for heart failure may include:  Echocardiography.  Electrocardiography.  Chest X-ray.  Blood tests.  Exercise stress test.  Cardiac  angiography.  Radionuclide scans. TREATMENT  Treatment is aimed at managing the symptoms of heart failure. Medicines, behavioral changes, or surgical intervention may be necessary to treat heart failure.  Medicines to help treat heart failure may include:  Angiotensin-converting enzyme (ACE) inhibitors. This type of medicine blocks the effects of a blood protein called angiotensin-converting enzyme. ACE inhibitors relax (dilate) the blood vessels and help lower blood pressure.  Angiotensin receptor blockers (ARBs). This type of medicine blocks the actions of a blood protein called angiotensin. Angiotensin receptor blockers dilate the blood vessels and help lower blood pressure.  Water pills (diuretics). Diuretics cause the kidneys to remove salt and water from the blood. The extra fluid is removed through urination. This loss of extra fluid lowers the volume of blood the heart pumps.  Beta blockers. These prevent the heart from beating too fast and improve heart muscle strength.  Digitalis. This increases the force of the heartbeat.  Healthy behavior changes include:  Obtaining and maintaining a healthy weight.  Stopping smoking or chewing tobacco.  Eating heart-healthy foods.  Limiting or avoiding alcohol.  Stopping illicit drug use.  Physical activity as directed by your health care provider.  Surgical treatment for heart  failure may include:  A procedure to open blocked arteries, repair damaged heart valves, or remove damaged heart muscle tissue.  A pacemaker to improve heart muscle function and control certain abnormal heart rhythms.  An internal cardioverter defibrillator to treat certain serious abnormal heart rhythms.  A left ventricular assist device (LVAD) to assist the pumping ability of the heart. HOME CARE INSTRUCTIONS   Take medicines only as directed by your health care provider. Medicines are important in reducing the workload of your heart, slowing the  progression of heart failure, and improving your symptoms.  Do not stop taking your medicine unless directed by your health care provider.  Do not skip any dose of medicine.  Refill your prescriptions before you run out of medicine. Your medicines are needed every day.  Engage in moderate physical activity if directed by your health care provider. Moderate physical activity can benefit some people. The elderly and people with severe heart failure should consult with a health care provider for physical activity recommendations.  Eat heart-healthy foods. Food choices should be free of trans fat and low in saturated fat, cholesterol, and salt (sodium). Healthy choices include fresh or frozen fruits and vegetables, fish, lean meats, legumes, fat-free or low-fat dairy products, and whole grain or high fiber foods. Talk to a dietitian to learn more about heart-healthy foods.  Limit sodium if directed by your health care provider. Sodium restriction may reduce symptoms of heart failure in some people. Talk to a dietitian to learn more about heart-healthy seasonings.  Use healthy cooking methods. Healthy cooking methods include roasting, grilling, broiling, baking, poaching, steaming, or stir-frying. Talk to a dietitian to learn more about healthy cooking methods.  Limit fluids if directed by your health care provider. Fluid restriction may reduce symptoms of heart failure in some people.  Weigh yourself every day. Daily weights are important in the early recognition of excess fluid. You should weigh yourself every morning after you urinate and before you eat breakfast. Wear the same amount of clothing each time you weigh yourself. Record your daily weight. Provide your health care provider with your weight record.  Monitor and record your blood pressure if directed by your health care provider.  Check your pulse if directed by your health care provider.  Lose weight if directed by your health care  provider. Weight loss may reduce symptoms of heart failure in some people.  Stop smoking or chewing tobacco. Nicotine makes your heart work harder by causing your blood vessels to constrict. Do not use nicotine gum or patches before talking to your health care provider.  Keep all follow-up visits as directed by your health care provider. This is important.  Limit alcohol intake to no more than 1 drink per day for nonpregnant women and 2 drinks per day for men. One drink equals 12 ounces of beer, 5 ounces of wine, or 1 ounces of hard liquor. Drinking more than that is harmful to your heart. Tell your health care provider if you drink alcohol several times a week. Talk with your health care provider about whether alcohol is safe for you. If your heart has already been damaged by alcohol or you have severe heart failure, drinking alcohol should be stopped completely.  Stop illicit drug use.  Stay up-to-date with immunizations. It is especially important to prevent respiratory infections through current pneumococcal and influenza immunizations.  Manage other health conditions such as hypertension, diabetes, thyroid disease, or abnormal heart rhythms as directed by your health care provider.  Learn to manage stress.  Plan rest periods when fatigued.  Learn strategies to manage high temperatures. If the weather is extremely hot:  Avoid vigorous physical activity.  Use air conditioning or fans or seek a cooler location.  Avoid caffeine and alcohol.  Wear loose-fitting, lightweight, and light-colored clothing.  Learn strategies to manage cold temperatures. If the weather is extremely cold:  Avoid vigorous physical activity.  Layer clothes.  Wear mittens or gloves, a hat, and a scarf when going outside.  Avoid alcohol.  Obtain ongoing education and support as needed.  Participate in or seek rehabilitation as needed to maintain or improve independence and quality of life. SEEK MEDICAL  CARE IF:   You have a rapid weight gain.  You have increasing shortness of breath that is unusual for you.  You are unable to participate in your usual physical activities.  You tire easily.  You cough more than normal, especially with physical activity.  You have any or more swelling in areas such as your hands, feet, ankles, or abdomen.  You are unable to sleep because it is hard to breathe.  You feel like your heart is beating fast (palpitations).  You become dizzy or light-headed upon standing up. SEEK IMMEDIATE MEDICAL CARE IF:   You have difficulty breathing.  There is a change in mental status such as decreased alertness or difficulty with concentration.  You have a pain or discomfort in your chest.  You have an episode of fainting (syncope). MAKE SURE YOU:   Understand these instructions.  Will watch your condition.  Will get help right away if you are not doing well or get worse.   This information is not intended to replace advice given to you by your health care provider. Make sure you discuss any questions you have with your health care provider.   Document Released: 11/13/2005 Document Revised: 03/30/2015 Document Reviewed: 12/13/2012 Elsevier Interactive Patient Education [employer]

## 2016-03-15 LAB — URINE CULTURE

## 2016-03-16 ENCOUNTER — Encounter: Payer: Self-pay | Admitting: Internal Medicine

## 2016-03-16 ENCOUNTER — Non-Acute Institutional Stay (SKILLED_NURSING_FACILITY): Payer: Medicare PPO | Admitting: Internal Medicine

## 2016-03-16 DIAGNOSIS — F411 Generalized anxiety disorder: Secondary | ICD-10-CM | POA: Diagnosis not present

## 2016-03-16 DIAGNOSIS — R195 Other fecal abnormalities: Secondary | ICD-10-CM | POA: Diagnosis not present

## 2016-03-16 DIAGNOSIS — I5023 Acute on chronic systolic (congestive) heart failure: Secondary | ICD-10-CM | POA: Diagnosis not present

## 2016-03-16 NOTE — Progress Notes (Signed)
LOCATION: [facility]  PCP: PROVIDER NOT IN SYSTEM   Code Status: Full Code  Goals of care: Advanced Directive information Advanced Directives 03/14/2016  Does patient have an advance directive? Yes  Type of Estate agent of [place];Living will  Does patient want to make changes to advanced directive? No - Patient declined  Copy of advanced directive(s) in chart? No - copy requested     Extended Emergency Contact Information Primary Emergency Contact: Haith,[name] Address: [address]          [place], [place] [address] [place] Home Phone: [phone] Relation: Sister Secondary Emergency Contact: Marcello Moores, [place] Home Phone: [phone] Relation: Sister   Allergies  Allergen Reactions  . Entresto [Sacubitril-Valsartan] Other (See Comments)    On [facility]    Chief Complaint  Patient presents with  . Acute Visit    Shortness of breath     HPI:  Patient is a 65 y.o. female seen today for acute visit. Her shortness of breath has worsened over last 3 days. She was sent to the ED where she was placed on oxygen and sent back to the facility for further management. Her lab work and imaging from ED are suggestive of CHF exacerbation. On review of her weight in the facility, she has gained 7 lbs since admission. She is seen in her room today. She is on oxygen at present. She is short of breath in between sentences and sound congested during her interview. She denies chest pain and palpitation. She complaints of loose stools x 2 days and mentions having 5 bowel movement yesterday. She gets tearful this visit and mentions about losing her sister 3 days back after which she feels her breathing has worsened and has developed loose stool. She is here for short term rehabilitation post hospital admission from 02/06/16-02/27/16 with CHF exacerbation. Her echocardiogram showed EF of 10-15%.    Review of  Systems:  Constitutional: Negative for fever, diaphoresis. Feels weak and tired.  HENT: Negative for headache, congestion, nasal discharge, sore throat, difficulty swallowing.   Eyes: Negative for blurred vision, double vision and discharge.  Respiratory: Negative for wheezing. Positive for dry cough and shortness of breath with rest Cardiovascular: Negative for chest pain. Positive for ocassional palpitations and leg swelling.   Gastrointestinal: Negative for heartburn, nausea, vomiting, abdominal pain.  Genitourinary: Negative for dysuria.  Musculoskeletal: Negative for back pain  Skin: Negative for itching, rash.  Neurological: Negative for dizziness. Psychiatric/Behavioral: Negative for depression  PMH- DM, HLD, obesity, OSA, CAD, systolic CHF, moderate pulmonary HTN   Medications:   Medication List       This list is accurate as of: 03/16/16  1:40 PM.  Always use your most recent med list.               acetaminophen 325 MG tablet  Commonly known as:  TYLENOL  Take 650 mg by mouth every 6 (six) hours as needed.     albuterol 108 (90 Base) MCG/ACT inhaler  Commonly known as:  PROVENTIL HFA;VENTOLIN HFA  Inhale 2 puffs into the lungs 4 (four) times daily as needed for wheezing or shortness of breath.     atorvastatin 10 MG tablet  Commonly known as:  LIPITOR  Take 10 mg by mouth daily.     beclomethasone 80 MCG/ACT inhaler  Commonly known as:  QVAR  Inhale 2 puffs into the  lungs 2 (two) times daily.     carvedilol 12.5 MG tablet  Commonly known as:  COREG  Take 12.5 mg by mouth 2 (two) times daily.     FIBER CHOICE 1.5 g Chew  Generic drug:  Inulin  Chew 2 tablets by mouth daily as needed (for constipation).     insulin glargine 100 UNIT/ML injection  Commonly known as:  LANTUS  Inject 18 Units into the skin at bedtime.     insulin lispro 100 UNIT/ML injection  Commonly known as:  HUMALOG  Inject 5 Units into the skin 3 (three) times daily before meals. For  CBG > 180     omeprazole 20 MG capsule  Commonly known as:  PRILOSEC  Take 1 capsule (20 mg total) by mouth daily.     torsemide 100 MG tablet  Commonly known as:  DEMADEX  Take 50-100 mg by mouth 2 (two) times daily. 100 mg every morning and 50 mg Every afternoon     warfarin 2.5 MG tablet  Commonly known as:  COUMADIN  Take 2.5 mg by mouth daily. Take along with 2 mg= 4.5 mg daily     warfarin 2 MG tablet  Commonly known as:  COUMADIN  Take 2 mg by mouth daily. Take along with 2.5 mg= 4.5 mg daily        Immunizations: Immunization History  Administered Date(s) Administered  . PPD Test 03/01/2016     Physical Exam: Filed Vitals:   03/16/16 1328  BP: 119/66  Pulse: 74  Temp: 98.2 F (36.8 C)  TempSrc: Oral  Resp: 20  Height:  (1.727 m)  Weight: 282 lb (127.914 kg)  SpO2: 98%    General- elderly female, morbidly obese, in mild distress, short of breath in between sentences Head- normocephalic, atraumatic Nose-  no maxillary or frontal sinus tenderness, no nasal discharge Throat- moist mucus membrane, upper dentures, poor lower dentition Eyes- no pallor, no icterus, no discharge Neck- no cervical lymphadenopathy, JVD + Cardiovascular- normal s1,s2, no murmur, 3+ leg edema Respiratory- bilateral poor air entry, + wheeze, + crackles, no use of accessory muscles, on o2 Abdomen- bowel sounds present, soft, non tender Musculoskeletal- able to move all 4 extremities, generalized weakness Neurological- alert and oriented to person, place and time Skin- warm and dry Psychiatry- appears anxious   Labs reviewed: Basic Metabolic Panel:  Recent Labs  81/19/14 03/14/16 1457 03/14/16 1534  NA 143 137 141  K 4.7 3.7 3.6  CL 105 100* 99*  CO2 27 27  --   GLUCOSE 139* 107* 106*  BUN 31* 40* 39*  CREATININE 1.49* 2.13* 2.00*  CALCIUM 9.0 8.2*  --     CBC:  Recent Labs  02/24/16 03/04/16 03/14/16 1457 03/14/16 1534  WBC 7.2 7.5 13.0*  --   NEUTROABS   --  5.7 11.0*  --   HGB 8.1* 8.7* 9.3* 11.9*  HCT 26* 28.4* 30.2* 35.0*  MCV  --  87.9 89.3  --   PLT 248 298 136*  --     Radiological Exams: 3/17 US Extremity Non Vasc LTD Impression: 1. Developing subcutaneous abscess in the right anterior chest/upper abdominal wall underneath the medial right breast measuring 3.6 x 2.9 x 1.8 cm.   Chest Xray Shows AICD , a right central line present and without evidence of pneumothorax.  Ultrasound of abdomen, shows distended IVC and hepatic veins secondary to cardiac disease.  Transthoracic echocardiogram, shows moderate to severely reduced left ventricular systolic function,  ejection fraction of 22% with right ventricular enlargement, left atrial enlargement, right atrial enlargement, severe TR and mild to moderate MR.    Assessment/Plan  chf exacerbation Change her torsemide to 120 mg in am and 80 mg in pm for now, check cbc, cmp, bnp in am. Check daily weight. Fluid restriction of 1.5 l/day. Continue coreg 12.5 mg bid. Check vital signs. Add duoneb nebulizer qid x 5 days. Get CXR to assess for pulmonary edema. Will need an appointment to establish care with cardiology in town. If weight continues to rise or her breathing does not improve, consider zaroxolyn   Loose stool Concerns of anxiety contributing to this with IBS symptom given her recent loss. Soft abdomen on exam. Will give her imodium for now and monitor. Check bmp. If loose stool persists, get infectious workup. Denies nausea or vomiting at present  Anxiety state Situational with death of her sister. Start ativan 0.5 mg bid x 5 days, then bid prn only and monitor   Labs/tests ordered: cbc, cmp, BNP, CXR  Family/ staff Communication: reviewed care plan with patient and nursing supervisor    [name], MD Internal Medicine [facility] [address] [place], [place] [address] Cell Phone (Monday-Friday 8 am - 5 pm): [phone] On Call:  [phone] and follow prompts after 5 pm and on weekends Office Phone: [phone] Office Fax: [phone]

## 2016-03-17 ENCOUNTER — Other Ambulatory Visit: Payer: Self-pay

## 2016-03-17 LAB — BASIC METABOLIC PANEL
BUN: 59 mg/dL — AB (ref 4–21)
Creatinine: 3.3 mg/dL — AB (ref 0.5–1.1)
Glucose: 59 mg/dL
Potassium: 3.4 mmol/L (ref 3.4–5.3)
SODIUM: 138 mmol/L (ref 137–147)

## 2016-03-17 LAB — CBC AND DIFFERENTIAL
HEMATOCRIT: 28 % — AB (ref 36–46)
HEMOGLOBIN: 9.1 g/dL — AB (ref 12.0–16.0)
PLATELETS: 135 10*3/uL — AB (ref 150–399)
WBC: 11.3 10^3/mL

## 2016-03-17 LAB — HEMOGLOBIN A1C: Hemoglobin A1C: 6.4

## 2016-03-17 LAB — LIPID PANEL
CHOLESTEROL: 49 mg/dL (ref 0–200)
HDL: 14 mg/dL — AB (ref 35–70)
LDL CALC: 21 mg/dL
TRIGLYCERIDES: 68 mg/dL (ref 40–160)

## 2016-03-17 LAB — HEPATIC FUNCTION PANEL
ALK PHOS: 130 U/L — AB (ref 25–125)
ALT: 8 U/L (ref 7–35)
AST: 11 U/L — AB (ref 13–35)
Bilirubin, Total: 2 mg/dL

## 2016-03-17 MED ORDER — LORAZEPAM 0.5 MG PO TABS: 0.5000 mg | ORAL_TABLET | Freq: Two times a day (BID) | ORAL | Status: DC | PRN

## 2016-03-17 NOTE — Telephone Encounter (Signed)
Rx faxed to [facility] @ [phone], phone number [phone]  

## 2016-03-20 ENCOUNTER — Inpatient Hospital Stay (HOSPITAL_COMMUNITY)
Admission: EM | Admit: 2016-03-20 | Discharge: 2016-04-10 | DRG: 356 | Disposition: A | Discharge status: Skilled Nursing Facility | Payer: Medicare PPO | Attending: Internal Medicine | Admitting: Internal Medicine

## 2016-03-20 ENCOUNTER — Non-Acute Institutional Stay (SKILLED_NURSING_FACILITY): Payer: Medicare PPO | Admitting: Internal Medicine

## 2016-03-20 ENCOUNTER — Emergency Department (HOSPITAL_COMMUNITY): Payer: Medicare PPO

## 2016-03-20 ENCOUNTER — Encounter: Payer: Self-pay | Admitting: Internal Medicine

## 2016-03-20 ENCOUNTER — Encounter (HOSPITAL_COMMUNITY): Payer: Self-pay | Admitting: Emergency Medicine

## 2016-03-20 DIAGNOSIS — I130 Hypertensive heart and chronic kidney disease with heart failure and stage 1 through stage 4 chronic kidney disease, or unspecified chronic kidney disease: Secondary | ICD-10-CM | POA: Diagnosis present

## 2016-03-20 DIAGNOSIS — F419 Anxiety disorder, unspecified: Secondary | ICD-10-CM | POA: Diagnosis present

## 2016-03-20 DIAGNOSIS — N186 End stage renal disease: Secondary | ICD-10-CM

## 2016-03-20 DIAGNOSIS — E1122 Type 2 diabetes mellitus with diabetic chronic kidney disease: Secondary | ICD-10-CM | POA: Diagnosis present

## 2016-03-20 DIAGNOSIS — I255 Ischemic cardiomyopathy: Secondary | ICD-10-CM | POA: Diagnosis present

## 2016-03-20 DIAGNOSIS — I509 Heart failure, unspecified: Secondary | ICD-10-CM | POA: Diagnosis not present

## 2016-03-20 DIAGNOSIS — E1121 Type 2 diabetes mellitus with diabetic nephropathy: Secondary | ICD-10-CM | POA: Diagnosis present

## 2016-03-20 DIAGNOSIS — E669 Obesity, unspecified: Secondary | ICD-10-CM

## 2016-03-20 DIAGNOSIS — L89892 Pressure ulcer of other site, stage 2: Secondary | ICD-10-CM | POA: Diagnosis not present

## 2016-03-20 DIAGNOSIS — I5043 Acute on chronic combined systolic (congestive) and diastolic (congestive) heart failure: Secondary | ICD-10-CM | POA: Diagnosis present

## 2016-03-20 DIAGNOSIS — I5023 Acute on chronic systolic (congestive) heart failure: Secondary | ICD-10-CM

## 2016-03-20 DIAGNOSIS — E869 Volume depletion, unspecified: Secondary | ICD-10-CM | POA: Diagnosis present

## 2016-03-20 DIAGNOSIS — Z888 Allergy status to other drugs, medicaments and biological substances status: Secondary | ICD-10-CM

## 2016-03-20 DIAGNOSIS — N179 Acute kidney failure, unspecified: Secondary | ICD-10-CM | POA: Diagnosis present

## 2016-03-20 DIAGNOSIS — I2510 Atherosclerotic heart disease of native coronary artery without angina pectoris: Secondary | ICD-10-CM | POA: Diagnosis present

## 2016-03-20 DIAGNOSIS — D638 Anemia in other chronic diseases classified elsewhere: Secondary | ICD-10-CM | POA: Diagnosis present

## 2016-03-20 DIAGNOSIS — A0472 Enterocolitis due to Clostridium difficile, not specified as recurrent: Secondary | ICD-10-CM

## 2016-03-20 DIAGNOSIS — M7989 Other specified soft tissue disorders: Secondary | ICD-10-CM

## 2016-03-20 DIAGNOSIS — E1169 Type 2 diabetes mellitus with other specified complication: Secondary | ICD-10-CM

## 2016-03-20 DIAGNOSIS — Z794 Long term (current) use of insulin: Secondary | ICD-10-CM

## 2016-03-20 DIAGNOSIS — J449 Chronic obstructive pulmonary disease, unspecified: Secondary | ICD-10-CM | POA: Diagnosis present

## 2016-03-20 DIAGNOSIS — J9690 Respiratory failure, unspecified, unspecified whether with hypoxia or hypercapnia: Secondary | ICD-10-CM

## 2016-03-20 DIAGNOSIS — D72829 Elevated white blood cell count, unspecified: Secondary | ICD-10-CM

## 2016-03-20 DIAGNOSIS — I5022 Chronic systolic (congestive) heart failure: Secondary | ICD-10-CM | POA: Diagnosis present

## 2016-03-20 DIAGNOSIS — R601 Generalized edema: Secondary | ICD-10-CM | POA: Diagnosis not present

## 2016-03-20 DIAGNOSIS — Z79899 Other long term (current) drug therapy: Secondary | ICD-10-CM

## 2016-03-20 DIAGNOSIS — Z7951 Long term (current) use of inhaled steroids: Secondary | ICD-10-CM | POA: Diagnosis not present

## 2016-03-20 DIAGNOSIS — I272 Other secondary pulmonary hypertension: Secondary | ICD-10-CM | POA: Diagnosis present

## 2016-03-20 DIAGNOSIS — L02213 Cutaneous abscess of chest wall: Secondary | ICD-10-CM | POA: Diagnosis present

## 2016-03-20 DIAGNOSIS — Z95810 Presence of automatic (implantable) cardiac defibrillator: Secondary | ICD-10-CM | POA: Diagnosis present

## 2016-03-20 DIAGNOSIS — E785 Hyperlipidemia, unspecified: Secondary | ICD-10-CM | POA: Diagnosis present

## 2016-03-20 DIAGNOSIS — I959 Hypotension, unspecified: Secondary | ICD-10-CM | POA: Diagnosis present

## 2016-03-20 DIAGNOSIS — N184 Chronic kidney disease, stage 4 (severe): Secondary | ICD-10-CM | POA: Diagnosis present

## 2016-03-20 DIAGNOSIS — K5900 Constipation, unspecified: Secondary | ICD-10-CM | POA: Diagnosis present

## 2016-03-20 DIAGNOSIS — E8889 Other specified metabolic disorders: Secondary | ICD-10-CM | POA: Diagnosis present

## 2016-03-20 DIAGNOSIS — A09 Infectious gastroenteritis and colitis, unspecified: Secondary | ICD-10-CM | POA: Diagnosis not present

## 2016-03-20 DIAGNOSIS — L89312 Pressure ulcer of right buttock, stage 2: Secondary | ICD-10-CM | POA: Diagnosis not present

## 2016-03-20 DIAGNOSIS — L8990 Pressure ulcer of unspecified site, unspecified stage: Secondary | ICD-10-CM | POA: Insufficient documentation

## 2016-03-20 DIAGNOSIS — Z86718 Personal history of other venous thrombosis and embolism: Secondary | ICD-10-CM | POA: Diagnosis not present

## 2016-03-20 DIAGNOSIS — N611 Abscess of the breast and nipple: Secondary | ICD-10-CM | POA: Diagnosis present

## 2016-03-20 DIAGNOSIS — Z9981 Dependence on supplemental oxygen: Secondary | ICD-10-CM | POA: Diagnosis not present

## 2016-03-20 DIAGNOSIS — I252 Old myocardial infarction: Secondary | ICD-10-CM | POA: Diagnosis not present

## 2016-03-20 DIAGNOSIS — G4733 Obstructive sleep apnea (adult) (pediatric): Secondary | ICD-10-CM | POA: Diagnosis present

## 2016-03-20 DIAGNOSIS — R197 Diarrhea, unspecified: Secondary | ICD-10-CM

## 2016-03-20 DIAGNOSIS — D696 Thrombocytopenia, unspecified: Secondary | ICD-10-CM | POA: Diagnosis not present

## 2016-03-20 DIAGNOSIS — N19 Unspecified kidney failure: Secondary | ICD-10-CM | POA: Diagnosis not present

## 2016-03-20 DIAGNOSIS — L89322 Pressure ulcer of left buttock, stage 2: Secondary | ICD-10-CM | POA: Diagnosis not present

## 2016-03-20 DIAGNOSIS — J9621 Acute and chronic respiratory failure with hypoxia: Secondary | ICD-10-CM | POA: Diagnosis present

## 2016-03-20 DIAGNOSIS — R791 Abnormal coagulation profile: Secondary | ICD-10-CM | POA: Diagnosis present

## 2016-03-20 DIAGNOSIS — R5381 Other malaise: Secondary | ICD-10-CM | POA: Diagnosis present

## 2016-03-20 DIAGNOSIS — N189 Chronic kidney disease, unspecified: Secondary | ICD-10-CM | POA: Diagnosis not present

## 2016-03-20 DIAGNOSIS — N185 Chronic kidney disease, stage 5: Secondary | ICD-10-CM | POA: Diagnosis not present

## 2016-03-20 DIAGNOSIS — Z6839 Body mass index (BMI) 39.0-39.9, adult: Secondary | ICD-10-CM

## 2016-03-20 DIAGNOSIS — A047 Enterocolitis due to Clostridium difficile: Secondary | ICD-10-CM

## 2016-03-20 DIAGNOSIS — E876 Hypokalemia: Secondary | ICD-10-CM

## 2016-03-20 DIAGNOSIS — Z7901 Long term (current) use of anticoagulants: Secondary | ICD-10-CM | POA: Diagnosis not present

## 2016-03-20 DIAGNOSIS — I10 Essential (primary) hypertension: Secondary | ICD-10-CM | POA: Diagnosis present

## 2016-03-20 DIAGNOSIS — F0390 Unspecified dementia without behavioral disturbance: Secondary | ICD-10-CM | POA: Diagnosis present

## 2016-03-20 DIAGNOSIS — Z452 Encounter for adjustment and management of vascular access device: Secondary | ICD-10-CM

## 2016-03-20 DIAGNOSIS — Z87891 Personal history of nicotine dependence: Secondary | ICD-10-CM

## 2016-03-20 DIAGNOSIS — I472 Ventricular tachycardia: Secondary | ICD-10-CM | POA: Diagnosis not present

## 2016-03-20 DIAGNOSIS — T368X5A Adverse effect of other systemic antibiotics, initial encounter: Secondary | ICD-10-CM | POA: Diagnosis present

## 2016-03-20 DIAGNOSIS — E875 Hyperkalemia: Secondary | ICD-10-CM | POA: Diagnosis not present

## 2016-03-20 DIAGNOSIS — Z992 Dependence on renal dialysis: Secondary | ICD-10-CM

## 2016-03-20 DIAGNOSIS — E119 Type 2 diabetes mellitus without complications: Secondary | ICD-10-CM | POA: Diagnosis not present

## 2016-03-20 DIAGNOSIS — Z8619 Personal history of other infectious and parasitic diseases: Secondary | ICD-10-CM | POA: Diagnosis present

## 2016-03-20 DIAGNOSIS — I82401 Acute embolism and thrombosis of unspecified deep veins of right lower extremity: Secondary | ICD-10-CM | POA: Diagnosis present

## 2016-03-20 LAB — GLUCOSE, CAPILLARY
GLUCOSE-CAPILLARY: 66 mg/dL (ref 65–99)
GLUCOSE-CAPILLARY: 63 mg/dL — AB (ref 65–99)

## 2016-03-20 LAB — CBC WITH DIFFERENTIAL/PLATELET
BASOS PCT: 1 %
Basophils Absolute: 0.1 10*3/uL (ref 0.0–0.1)
EOS PCT: 3 %
Eosinophils Absolute: 0.2 10*3/uL (ref 0.0–0.7)
HEMATOCRIT: 28.4 % — AB (ref 36.0–46.0)
HEMOGLOBIN: 9 g/dL — AB (ref 12.0–15.0)
LYMPHS PCT: 10 %
Lymphs Abs: 0.8 10*3/uL (ref 0.7–4.0)
MCH: 27.1 pg (ref 26.0–34.0)
MCHC: 31.7 g/dL (ref 30.0–36.0)
MCV: 85.5 fL (ref 78.0–100.0)
MONOS PCT: 9 %
Monocytes Absolute: 0.7 10*3/uL (ref 0.1–1.0)
NEUTROS PCT: 77 %
Neutro Abs: 5.7 10*3/uL (ref 1.7–7.7)
Platelets: 165 10*3/uL (ref 150–400)
RBC: 3.32 MIL/uL — ABNORMAL LOW (ref 3.87–5.11)
RDW: 17.3 % — ABNORMAL HIGH (ref 11.5–15.5)
WBC: 7.5 10*3/uL (ref 4.0–10.5)

## 2016-03-20 LAB — PROTIME-INR
INR: 6.38 (ref 0.00–1.49)
Prothrombin Time: 54 seconds — ABNORMAL HIGH (ref 11.6–15.2)

## 2016-03-20 LAB — BASIC METABOLIC PANEL
Anion gap: 12 (ref 5–15)
BUN: 68 mg/dL — ABNORMAL HIGH (ref 6–20)
CHLORIDE: 104 mmol/L (ref 101–111)
CO2: 23 mmol/L (ref 22–32)
Calcium: 7.9 mg/dL — ABNORMAL LOW (ref 8.9–10.3)
Creatinine, Ser: 4.44 mg/dL — ABNORMAL HIGH (ref 0.44–1.00)
GFR calc Af Amer: 11 mL/min — ABNORMAL LOW (ref >60)
GFR, EST NON AFRICAN AMERICAN: 10 mL/min — AB (ref >60)
GLUCOSE: 77 mg/dL (ref 65–99)
POTASSIUM: 3.4 mmol/L — AB (ref 3.5–5.1)
Sodium: 139 mmol/L (ref 135–145)

## 2016-03-20 LAB — I-STAT TROPONIN, ED: Troponin i, poc: 0.01 ng/mL (ref 0.00–0.08)

## 2016-03-20 LAB — BRAIN NATRIURETIC PEPTIDE: B NATRIURETIC PEPTIDE 5: 1489.8 pg/mL — AB (ref 0.0–100.0)

## 2016-03-20 MED ORDER — WARFARIN SODIUM 2 MG PO TABS: 2.0000 mg | ORAL_TABLET | Freq: Every day | ORAL | Status: DC

## 2016-03-20 MED ORDER — SODIUM CHLORIDE 0.9% FLUSH
3.0000 mL | Freq: Two times a day (BID) | INTRAVENOUS | Status: DC
Start: 2016-03-20 — End: 2016-03-26
  Administered 2016-03-20 – 2016-03-26 (×10): 3 mL via INTRAVENOUS

## 2016-03-20 MED ORDER — WARFARIN SODIUM 2.5 MG PO TABS: 2.5000 mg | ORAL_TABLET | Freq: Every day | ORAL | Status: DC

## 2016-03-20 MED ORDER — BUDESONIDE 0.25 MG/2ML IN SUSP
0.2500 mg | Freq: Two times a day (BID) | RESPIRATORY_TRACT | Status: DC
  Administered 2016-03-20 – 2016-03-26 (×10): 0.25 mg via RESPIRATORY_TRACT
  Filled 2016-03-20 (×12): qty 2

## 2016-03-20 MED ORDER — FUROSEMIDE 10 MG/ML IJ SOLN
20.0000 mg/h | INTRAMUSCULAR | Status: DC
  Administered 2016-03-20 – 2016-03-21 (×2): 15 mg/h via INTRAVENOUS
  Administered 2016-03-22: 20 mg/h via INTRAVENOUS
  Filled 2016-03-20 (×6): qty 25

## 2016-03-20 MED ORDER — ACETAMINOPHEN 650 MG RE SUPP: 650.0000 mg | Freq: Four times a day (QID) | RECTAL | Status: DC | PRN

## 2016-03-20 MED ORDER — ALBUTEROL SULFATE HFA 108 (90 BASE) MCG/ACT IN AERS: 2.0000 | INHALATION_SPRAY | Freq: Four times a day (QID) | RESPIRATORY_TRACT | Status: DC | PRN

## 2016-03-20 MED ORDER — SODIUM CHLORIDE 0.9 % IV SOLN
Freq: Once | INTRAVENOUS | Status: AC
  Administered 2016-03-20: 14:00:00 via INTRAVENOUS

## 2016-03-20 MED ORDER — VANCOMYCIN 50 MG/ML ORAL SOLUTION
125.0000 mg | Freq: Four times a day (QID) | ORAL | Status: DC
  Administered 2016-03-20 – 2016-03-23 (×10): 125 mg via ORAL
  Filled 2016-03-20 (×15): qty 2.5

## 2016-03-20 MED ORDER — ACETAMINOPHEN 325 MG PO TABS
650.0000 mg | ORAL_TABLET | Freq: Four times a day (QID) | ORAL | Status: DC | PRN
  Administered 2016-03-29 – 2016-04-05 (×3): 650 mg via ORAL
  Filled 2016-03-20 (×3): qty 2

## 2016-03-20 MED ORDER — ONDANSETRON HCL 4 MG/2ML IJ SOLN: 4.0000 mg | Freq: Four times a day (QID) | INTRAMUSCULAR | Status: DC | PRN

## 2016-03-20 MED ORDER — INSULIN ASPART 100 UNIT/ML ~~LOC~~ SOLN
0.0000 [IU] | Freq: Three times a day (TID) | SUBCUTANEOUS | Status: DC
  Administered 2016-03-21: 1 [IU] via SUBCUTANEOUS
  Administered 2016-03-22 – 2016-03-23 (×3): 2 [IU] via SUBCUTANEOUS
  Administered 2016-03-23 – 2016-03-24 (×4): 1 [IU] via SUBCUTANEOUS
  Administered 2016-03-24: 2 [IU] via SUBCUTANEOUS
  Administered 2016-03-25: 3 [IU] via SUBCUTANEOUS

## 2016-03-20 MED ORDER — FUROSEMIDE 10 MG/ML IJ SOLN: 80.0000 mg | Freq: Once | INTRAMUSCULAR | Status: DC

## 2016-03-20 MED ORDER — HYDROCODONE-ACETAMINOPHEN 5-325 MG PO TABS: 1.0000 | ORAL_TABLET | ORAL | Status: DC | PRN

## 2016-03-20 MED ORDER — METRONIDAZOLE IN NACL 5-0.79 MG/ML-% IV SOLN
500.0000 mg | Freq: Three times a day (TID) | INTRAVENOUS | Status: DC
  Administered 2016-03-20: 500 mg via INTRAVENOUS
  Filled 2016-03-20: qty 100

## 2016-03-20 MED ORDER — ONDANSETRON HCL 4 MG PO TABS: 4.0000 mg | ORAL_TABLET | Freq: Four times a day (QID) | ORAL | Status: DC | PRN

## 2016-03-20 MED ORDER — ATORVASTATIN CALCIUM 10 MG PO TABS
10.0000 mg | ORAL_TABLET | Freq: Every day | ORAL | Status: DC
  Administered 2016-03-21 – 2016-04-10 (×21): 10 mg via ORAL
  Filled 2016-03-20 (×21): qty 1

## 2016-03-20 MED ORDER — BECLOMETHASONE DIPROPIONATE 80 MCG/ACT IN AERS: 2.0000 | INHALATION_SPRAY | Freq: Two times a day (BID) | RESPIRATORY_TRACT | Status: DC

## 2016-03-20 MED ORDER — LORAZEPAM 0.5 MG PO TABS
0.5000 mg | ORAL_TABLET | Freq: Two times a day (BID) | ORAL | Status: DC
  Administered 2016-03-20 – 2016-03-22 (×3): 0.5 mg via ORAL
  Filled 2016-03-20 (×3): qty 1

## 2016-03-20 NOTE — ED Notes (Signed)
Patient transported to X-ray 

## 2016-03-20 NOTE — ED Notes (Signed)
DR. Konrad Dolores made aware of CVP monitoring order and that patient cannot go to [facility] needing this, Dr. Konrad Dolores advised he would discontinue this order and that he is okay with patient going to room on 2[facility].

## 2016-03-20 NOTE — ED Notes (Signed)
Lab notified this RN that patient needs another lavendar top tube draw, mini lab made aware of need for blood draw.

## 2016-03-20 NOTE — H&P (Signed)
History and Physical    Brandy Dudley ZOX:[id number] DOB: 10/14/1951 DOA: 03/20/2016  Referring MD/NP/PA: EDP PCP: PROVIDER NOT IN SYSTEM  Outpatient Specialists: Patient Care Team: Provider Not In System as PCP - General Patient coming from: SNF    Chief Complaint: Diarrhea  HPI: Brandy Dudley is a 65 y.o. female with medical history significant of HTN, HLD, CKD, COPD, CAD, history of DVT on Coumadin, SHF with EF 10 %brought to the emergency department by EMS with increasing shortness of breath and diarrhea. [facility] is [facility]. Over the last 3 days she had multiple episodes of foul smelling diarrhea with rapid C diff +. Of note, she was being treated with clindamycin 4 the treatment of right small breast abscess, which is now healing . Today, the patient was reporting not feeling well, with increasing shortness of breath.She had received a large dose of torsemide on 4/20 to 120 am and 80 mg pm without helping her symptoms  She has been increasingly weak, also reporting bilateral lower extremity swelling, acute on chronic. She denies any fever, chills or cough. She denies any chest pain or abdominal pain. She denies any bleeding issues. She reports abdominal cramping. Appetite is normal. Denies any dysuria. Denies abnormal skin rashes. Denies any bleeding issues such as epistaxis, hematemesis, hematuria or hematochezia. SHe denies any sick contacts.    ED Course:  BP 111/72 mmHg  Pulse 79  Resp 20  Wt 133 kg (293 lb 3.4 oz)  SpO2 96% WBC 7.5. Hb 9.0. BNP 1489.8, Tn negative.  CXR Stable cardiomegaly, small pleural effusions and bibasilar atelectasis. No acute findings. She was started on Flagyl 500 mg tid after stool cultures were drawn.   Review of Systems: As per HPI otherwise 10 point review of systems negative.   Past Medical History  Diagnosis Date  . Coronary artery disease   . CHF (congestive heart failure) (HCC)   . COPD (chronic obstructive pulmonary  disease) (HCC)   . Hypertension    Surgeries:  S/p ICD in [place]    reports that she quit smoking about 10 years ago. Her smoking use included Cigarettes. She has never used smokeless tobacco. She reports that she does not drink alcohol or use illicit drugs.  Allergies  Allergen Reactions  . Entresto [Sacubitril-Valsartan] Other (See Comments)    On MAR    No family history on file.  Family history reviewed and not pertinent (If you reviewed it)  Prior to Admission medications   Medication Sig Start Date End Date Taking? Authorizing Provider  albuterol (PROVENTIL HFA;VENTOLIN HFA) 108 (90 Base) MCG/ACT inhaler Inhale 2 puffs into the lungs every 6 (six) hours as needed for wheezing or shortness of breath.     Historical Provider, MD  atorvastatin (LIPITOR) 10 MG tablet Take 10 mg by mouth daily.    Historical Provider, MD  beclomethasone (QVAR) 80 MCG/ACT inhaler Inhale 2 puffs into the lungs 2 (two) times daily.    Historical Provider, MD  carvedilol (COREG) 12.5 MG tablet Take 12.5 mg by mouth 2 (two) times daily.    Historical Provider, MD  clindamycin (CLEOCIN) 300 MG capsule Take 300 mg by mouth every 6 (six) hours.    Historical Provider, MD  insulin glargine (LANTUS) 100 UNIT/ML injection Inject 18 Units into the skin at bedtime.    Historical Provider, MD  insulin lispro (HUMALOG) 100 UNIT/ML injection Inject 5 Units into the skin 3 (three) times daily with meals. For CBG > 180  Historical Provider, MD  Inulin (FIBER CHOICE) 1.5 g CHEW Chew 2 tablets by mouth daily as needed (for constipation).     Historical Provider, MD  LORazepam (ATIVAN) 0.5 MG tablet Take 0.5 mg by mouth 2 (two) times daily. Stop date 03/20/16    Historical Provider, MD  omeprazole (PRILOSEC) 20 MG capsule Take 1 capsule (20 mg total) by mouth daily. 03/04/16   Tiffany Neva Seat, PA-C  torsemide (DEMADEX) 100 MG tablet Take 100 mg by mouth daily. Take 120 mg in the morning. Take 80 mg in the evening     Historical Provider, MD  warfarin (COUMADIN) 2 MG tablet Take 2 mg by mouth daily. Take along with 2.5 mg= 4.5 mg daily    Historical Provider, MD  warfarin (COUMADIN) 2.5 MG tablet Take 2.5 mg by mouth daily. Take along with 2 mg= 4.5 mg daily    Historical Provider, MD    Physical Exam:    Filed Vitals:   03/20/16 1430 03/20/16 1445 03/20/16 1455 03/20/16 1500  BP: 101/69 111/67  111/72  Pulse:  80  79  Resp:  20 20   Weight:      SpO2: 94%  95% 96%      Constitutional: NAD, calm, comfortable Filed Vitals:   03/20/16 1430 03/20/16 1445 03/20/16 1455 03/20/16 1500  BP: 101/69 111/67  111/72  Pulse:  80  79  Resp:  20 20   Weight:      SpO2: 94%  95% 96%   Eyes: PERRL, lids and conjunctivae normal ENMT: Mucous membranes are moist. Posterior pharynx clear of any exudate or lesions.Several missing teeth Neck: normal, supple, no masses, no thyromegaly Respiratory: clear to auscultation bilaterally, no wheezing, bibasilar crackles. Normal respiratory effort. No accessory muscle use.  Cardiovascular: Regular rate and rhythm, no murmurs / rubs / gallops. Bilateral 2+ pitting lower extremity edema. 2+ pedal pulses. No carotid bruits. Bilateral JVD 3-4 cm Abdomen: no tenderness, no masses palpated. No hepatosplenomegaly. Bowel sounds positive, active.  Skin, right breast healing abscess Neurologic: CN 2-12 grossly intact. Sensation intact, DTR normal. Strength 5/5 in all 4.  Psychiatric: Normal judgment and insight. Alert and oriented x 3. Normal mood.     Labs on Admission: I have personally reviewed following labs and imaging studies  CBC:  Recent Labs Lab 03/14/16 1457 03/14/16 1534 03/17/16 03/20/16 1227  WBC 13.0*  --  11.3 7.5  NEUTROABS 11.0*  --   --  5.7  HGB 9.3* 11.9* 9.1* 9.0*  HCT 30.2* 35.0* 28* 28.4*  MCV 89.3  --   --  85.5  PLT 136*  --  135* 165    Basic Metabolic Panel:  Recent Labs Lab 03/14/16 1457 03/14/16 1534 03/17/16 03/20/16 1227  NA  137 141 138 139  K 3.7 3.6 3.4 3.4*  CL 100* 99*  --  104  CO2 27  --   --  23  GLUCOSE 107* 106*  --  77  BUN 40* 39* 59* 68*  CREATININE 2.13* 2.00* 3.3* 4.44*  CALCIUM 8.2*  --   --  7.9*    GFR: Estimated Creatinine Clearance: 18.2 mL/min (by C-G formula based on Cr of 4.44).  Liver Function Tests:  Recent Labs Lab 03/17/16  AST 11*  ALT 8  ALKPHOS 130*   No results for input(s): LIPASE, AMYLASE in the last 168 hours. No results for input(s): AMMONIA in the last 168 hours.  Coagulation Profile:  Recent Labs Lab 03/14/16 1457  INR 3.01*  Urine analysis:    Component Value Date/Time   COLORURINE AMBER* 03/14/2016 1655   APPEARANCEUR CLOUDY* 03/14/2016 1655   LABSPEC 1.019 03/14/2016 1655   PHURINE 5.0 03/14/2016 1655   GLUCOSEU NEGATIVE 03/14/2016 1655   HGBUR LARGE* 03/14/2016 1655   BILIRUBINUR SMALL* 03/14/2016 1655   KETONESUR 15* 03/14/2016 1655   PROTEINUR 30* 03/14/2016 1655   NITRITE NEGATIVE 03/14/2016 1655   LEUKOCYTESUR TRACE* 03/14/2016 1655    Sepsis Labs: @LABRCNTIP (procalcitonin:4,lacticidven:4) ) Recent Results (from the past 240 hour(s))  Urine culture     Status: None   Collection Time: 03/14/16  4:55 PM  Result Value Ref Range Status   Specimen Description URINE, CATHETERIZED  Final   Special Requests NONE  Final   Culture MULTIPLE SPECIES PRESENT, SUGGEST RECOLLECTION  Final   Report Status 03/15/2016 FINAL  Final     Radiological Exams on Admission: Dg Chest 2 View  03/20/2016  CLINICAL DATA:  COPD exacerbation with diarrhea for 1.5 weeks. History of recent acute renal failure with temporary dialysis. Additional history of coronary artery disease, congestive heart failure, COPD and hypertension. EXAM: CHEST  2 VIEW COMPARISON:  03/14/2016 and 03/04/2016. FINDINGS: The left subclavian AICD lead appears unchanged. There is stable cardiomegaly, bilateral pleural effusions and mild bibasilar atelectasis. No edema, confluent airspace  opacity or pneumothorax. The bones appear unchanged. IMPRESSION: Stable cardiomegaly, small pleural effusions and bibasilar atelectasis. No acute findings. Electronically Signed   By: Carey Bullocks M.D.   On: 03/20/2016 13:22    EKG: Independently reviewed.  Assessment/Plan Principal Problem:   C. difficile colitis Active Problems:   Physical deconditioning   DVT (deep venous thrombosis), right   Chronic systolic congestive heart failure (HCC)   Cardiomyopathy, ischemic   Coronary artery disease involving native coronary artery of native heart without angina pectoris   Essential hypertension, malignant   Diabetes mellitus type 2 in obese (HCC)   COPD (chronic obstructive pulmonary disease) (HCC)   Chest wall abscess   Obstructive sleep apnea   AICD (automatic cardioverter/defibrillator) present    C Diff colitis with significant diarrhea after initiation of Clindamycin for a breast abscess in SNF. BP 111/72 mmHg  Pulse 79  Resp 20  Wt 133 kg (293 lb 3.4 oz)  SpO2 96%  CDiff rapid screen in SNF positive . WBC 7.5, ill but non toxic appearing Received Flagyl tid. D/c Flagyl Admit to tele Enteric precautions Vanco per protocol  prn Zofran for nausea  - Will check C diff pcr, GI pathogen panel, stool culture - Blood culture x 2   Avoid Clindamycin  Continue IVFcautiously due to acute CHF exacerbation. Appreciate CHF consult - may consult to GI if getting worse    Acute respiratory distress likely due to Acute on chronic systolic CHF/ ICM /s/p AICD  with EF 10 % increased peripheral edema and JVD. Tn 0.01, BNP 1489, EKG without significant changes from prior on 4/7.  CXR no significant changes. Not hypoxic but with increased WoB. She had received a large dose of torsemide on 4/20 to 120 am and 80 mg pm without helping her symptoms  -admit to telemetry CHF team consultation, awaiting  Plans   History of DVT on Coumadin Current INR 3.01 O2 as needed Continue Coumadin with goal  of 2-3.   Hyperlipidemia Continue home statins with Lipitor  Type II Diabetes Home regimen includes Current blood sugar level is 77 Lab Results  Component Value Date   HGBA1C 6.4 03/17/2016  Hold home oral diabetic  medications.   SSI Heart healthy carb modified diet.  Anemia Acute on chronic Hemoglobin 9 on admission.  -Transfuse 1 unit packed red blood cells if Hb less than 8   ESRD less than 15 , baseline creatinine 2's   . No apparent dialysis -  Minimize nephrotoxins and renally dose medications Lab Results  Component Value Date   CREATININE 4.44* 03/20/2016   CREATININE 3.3* 03/17/2016   CREATININE 2.00* 03/14/2016    Deconditioning Consult PT/OT/Nutrition  DVT prophylaxis: Anticoagulated with Coumadin, plans as per Cards Code Status:   Full (discussed with patient her code status and she adamantly wants "everything done") Family Communication:  Discussed with patient Disposition Plan: Expect patient to be discharged to SNF Consults called:   CHF consult  Admission status: InpatientTele   [facility] EPA-C [facility]   If 7PM-7AM, please contact night-coverage [email or web address] Password [facility]  03/20/2016, 3:13 PM

## 2016-03-20 NOTE — Consult Note (Signed)
Advanced Heart Failure Team Consult Note  Referring Physician: Dr. Glade Lloyd Primary Physician:  Primary Cardiologist:    Reason for Consultation: Systolic CHF and need for IVF resuscitation 2/2 C. Diff.  HPI:    Brandy Dudley is a 65 y.o. 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  She reports foul smelling diarrhea and clindamycin for a breast abscess.  Pertinent labs on admission include K 3.4, Creatinine 4.44, BNP 1489.8, Troponin negative. CXR with stable cardiomegaly, small pleural effusions and bibasilar atelectasis. No acute findings. HF team was consulted due to low EF and need for fluid replacement from C. Diff.   Was admitted to [facility] in [place] with prolonged admission from at least 3/15 to 03/01/16 after a complicated admission which included A/C systolic HF and AKI. She had required dialysis during a previous admission. Paperwork states Echo 02/09/16 with EF 22% with RV enlargement, LAE, RAE, severe TR, and mild to mod MR.   Accuracy unclear, but pt weighed 260 lbs on 03/03/16 and weighs 293 lbs today by ED scales. By paper work she weighed 275 lbs on 03/12/2016.  She denies SOB currently.  States at baseline she is not SOB at rest or with ADLs. Stays at [facility].  Pt is a difficult historian and seems to have ? Baseline dementia. States she has had heart problems since the 1990s..  Review of Systems: [y] = yes, [ ]  = no   General: Weight gain y ]; Weight loss [ ] ; Anorexia [ ] ; Fatigue [y]; Fever [ ] ; Chills [ ] ; Weakness [ ]   Cardiac: Chest pain/pressure [ ] ; Resting SOB [ ] ; Exertional SOB [y]; Orthopnea [ ] ; Pedal Edema [ ] ; Palpitations [ ] ; Syncope [ ] ; Presyncope [ ] ;  Paroxysmal nocturnal dyspnea[ ]   Pulmonary: Cough [ ] ; Wheezing[ ] ; Hemoptysis[ ] ; Sputum [ ] ; Snoring [ ]   GI: Vomiting[ ] ; Dysphagia[ ] ; Melena[ ] ; Hematochezia [ ] ; Heartburn[ ] ; Abdominal pain [ ] ; Constipation [ ] ; Diarrhea [y]; BRBPR [ ]   GU: Hematuria[ ] ; Dysuria [ ] ; Nocturia[ ]   Vascular: Pain in legs with walking [ ] ; Pain in feet with lying flat [ ] ; Non-healing sores [ ] ; Stroke [ ] ; TIA [ ] ; Slurred speech [ ] ;  Neuro: Headaches[ ] ; Vertigo[ ] ; Seizures[ ] ; Paresthesias[ ] ;Blurred vision [ ] ; Diplopia [ ] ; Vision changes [ ]   Ortho/Skin: Arthritis [ ] ; Joint pain [ ] ; Muscle pain [ ] ; Joint swelling [ ] ; Back Pain [ ] ; Rash [ ]   Psych: Depression[ ] ; Anxiety[ ]   Heme: Bleeding problems [ ] ; Clotting disorders [ ] ; Anemia [y]  Endocrine: Diabetes [y]; Thyroid dysfunction[ ]   Home Medications Prior to Admission medications   Medication Sig Start Date End Date Taking? Authorizing Provider  albuterol (PROVENTIL HFA;VENTOLIN HFA) 108 (90 Base) MCG/ACT inhaler Inhale 2 puffs into the lungs every 6 (six) hours as needed for wheezing or shortness of breath.     Historical Provider, MD  atorvastatin (LIPITOR) 10 MG tablet Take 10 mg by mouth daily.    Historical Provider, MD  beclomethasone (QVAR) 80 MCG/ACT inhaler Inhale 2 puffs into the lungs 2 (two) times daily.    Historical Provider, MD  carvedilol (COREG) 12.5 MG tablet Take 12.5 mg by mouth 2 (two) times daily.    Historical Provider, MD  clindamycin (CLEOCIN) 300 MG capsule Take 300 mg by mouth every 6 (six) hours.    Historical Provider, MD  insulin glargine (LANTUS) 100 UNIT/ML injection Inject 18 Units into the skin at bedtime.    Historical Provider, MD  insulin lispro (HUMALOG) 100 UNIT/ML injection Inject 5 Units into the skin 3 (three) times daily with meals. For CBG > 180    Historical Provider, MD  Inulin (FIBER CHOICE) 1.5 g CHEW Chew 2 tablets by mouth daily as needed (for constipation).     Historical Provider, MD    LORazepam (ATIVAN) 0.5 MG tablet Take 0.5 mg by mouth 2 (two) times daily. Stop date 03/20/16    Historical Provider, MD  omeprazole (PRILOSEC) 20 MG capsule Take 1 capsule (20 mg total) by mouth daily. 03/04/16   Tiffany Neva Seat, PA-C  torsemide (DEMADEX) 100 MG tablet Take 100 mg by mouth daily. Take 120 mg in the morning. Take 80 mg in the evening    Historical Provider, MD  warfarin (COUMADIN) 2 MG tablet Take 2 mg by mouth daily. Take along with 2.5 mg= 4.5 mg daily    Historical Provider, MD  warfarin (COUMADIN) 2.5 MG tablet Take 2.5 mg by mouth daily. Take along with 2 mg= 4.5 mg daily    Historical Provider, MD    Past Medical History: Past Medical History  Diagnosis Date  . Coronary artery disease   . CHF (congestive heart failure) (HCC)   . COPD (chronic obstructive pulmonary disease) (HCC)   . Hypertension     Past Surgical History: History reviewed. No pertinent past surgical history.  Family History: No family history on file.  Social History: Social History   Social History  . Marital Status: Single    Spouse Name: N/A  . Number of Children: N/A  . Years of Education: N/A   Social History Main Topics  . Smoking status: Former Smoker    Types: Cigarettes    Quit date: 03/03/2006  . Smokeless tobacco: Never Used  . Alcohol Use: No  . Drug Use: No  . Sexual Activity: No   Other Topics Concern  . None   Social History Narrative    Allergies:  Allergies  Allergen Reactions  . Entresto [Sacubitril-Valsartan] Other (See Comments)    On MAR    Objective:    Vital Signs:   Temp:  [98.3 F (36.8 C)] 98.3 F (36.8 C) (04/24 1026) Pulse Rate:  [56-62] 56 (04/24 1400) Resp:  [16-21] 21 (04/24 1400) BP: (90-140)/(53-77) 101/69 mmHg (04/24 1430) SpO2:  [94 %-100 %] 94 % (04/24 1430) Weight:  [282 lb (127.914 kg)-293 lb 3.4 oz (133 kg)] 293 lb 3.4 oz (133 kg) (04/24 1201)    Weight change: Filed Weights   03/20/16 1201  Weight: 293 lb 3.4 oz (133 kg)     Intake/Output:  No intake or output data in the 24 hours ending 03/20/16 1450   Physical Exam: General:  Appears older than stated age, NAD.  HEENT: normal Neck: supple. JVP 16 cm. Carotids 2+ bilat; no bruits. No lymphadenopathy or thyromegaly appreciated. Cor: PMI nondisplaced. RRR. + TR/MR murmur Lungs: Decreased bilaterally with mild basilar crackles Abdomen: Obese, tight, umbilical hernia, NT, ND, no HSM. +BS  Extremities: no cyanosis, clubbing, rash. 3+ edema nearly up to flanks.  Neuro: Drowsy but orientedx3, cranial nerves grossly intact. moves all 4 extremities  w/o difficulty. Affect pleasant  Telemetry: NSR 50-60s  Labs: Basic Metabolic Panel:  Recent Labs Lab 03/14/16 1457 03/14/16 1534 03/17/16 03/20/16 1227  NA 137 141 138 139  K 3.7 3.6 3.4 3.4*  CL 100* 99*  --  104  CO2 27  --   --  23  GLUCOSE 107* 106*  --  77  BUN 40* 39* 59* 68*  CREATININE 2.13* 2.00* 3.3* 4.44*  CALCIUM 8.2*  --   --  7.9*    Liver Function Tests:  Recent Labs Lab 03/17/16  AST 11*  ALT 8  ALKPHOS 130*   No results for input(s): LIPASE, AMYLASE in the last 168 hours. No results for input(s): AMMONIA in the last 168 hours.  CBC:  Recent Labs Lab 03/14/16 1457 03/14/16 1534 03/17/16 03/20/16 1227  WBC 13.0*  --  11.3 7.5  NEUTROABS 11.0*  --   --  5.7  HGB 9.3* 11.9* 9.1* 9.0*  HCT 30.2* 35.0* 28* 28.4*  MCV 89.3  --   --  85.5  PLT 136*  --  135* 165    Cardiac Enzymes: No results for input(s): CKTOTAL, CKMB, CKMBINDEX, TROPONINI in the last 168 hours.  BNP: BNP (last 3 results)  Recent Labs  03/14/16 1457 03/20/16 1227  BNP 1509.6* 1489.8*    ProBNP (last 3 results) No results for input(s): PROBNP in the last 8760 hours.   CBG: No results for input(s): GLUCAP in the last 168 hours.  Coagulation Studies: No results for input(s): LABPROT, INR in the last 72 hours.  Other results: EKG: NSR, low voltage, nonspecific T wave  flattening  Imaging: Dg Chest 2 View  03/20/2016  CLINICAL DATA:  COPD exacerbation with diarrhea for 1.5 weeks. History of recent acute renal failure with temporary dialysis. Additional history of coronary artery disease, congestive heart failure, COPD and hypertension. EXAM: CHEST  2 VIEW COMPARISON:  03/14/2016 and 03/04/2016. FINDINGS: The left subclavian AICD lead appears unchanged. There is stable cardiomegaly, bilateral pleural effusions and mild bibasilar atelectasis. No edema, confluent airspace opacity or pneumothorax. The bones appear unchanged. IMPRESSION: Stable cardiomegaly, small pleural effusions and bibasilar atelectasis. No acute findings. Electronically Signed   By: Carey Bullocks M.D.   On: 03/20/2016 13:22      Medications:     Current Medications:     Infusions: . metronidazole 500 mg (03/20/16 1245)      Assessment/Plan   Brandy Dudley is a 65 y.o. admitted from [facility] to IM service per notes for further evaluation for " possible acute diverticulitis, Acute CHF exacerbation, GI bleed, and ARF on CKD stage III". HF team consulted to assist with fluid management.   1. Acute on Chronic Systolic CHF 2/2 ICM with CAD - LVEF 22% per hospital records on chart.  - She has anasarca on exam with 3-4+ edema into thighs and lower abdomen, with 1+ edema in bilateral UEs as well. - With her degree of renal disease, will likely be very difficult to diuresis. If she does not respond to aggressive IV diuresis, would involve nephrology quickly. Will discuss optimal dosing with MD.  - Was on torsemide 120 mg q am and 80 mg q pm PTA and weight seems to have been trending up over past month.  2. ARF on CKD III-IV - Per hospital notes from [facility] she required an emergent vascath placed and dialysis during her previous admission starting on 02/09/16. Her peak Cr that admission was at least 2.82 that  admission, and is 4.44 today. - Will likely require renal  intervention again this admission.  3. C. Diff colitis with significant diarrhea  - Likely 2/2 clindamycin for a breast abscess in SNF.  - Per primary team. - Notes from SNF also mention possible acute diverticulitis and ? GI bleed?  Hgb 9.0 4. Hx of RIJ thrombus - On coumadin.  5. HTN - Soft currently 6. DM2  - Per primary 7. Anemia of chronic disease - Per primary  Length of Stay: 0  Graciella Freer PA-C 03/20/2016, 2:50 PM  Advanced Heart Failure Team Pager [phone] (M-F; 7a - 4p)  Please contact [facility] for night-coverage after hours (4p -7a ) and weekends on [email or web address]  Patient seen with PA, agree with the above note.   1. Acute on chronic systolic CHF: Ischemic cardiomyopathy with history of prior CAD (details not available).  She has an ICD, thinks Environmental manager. Last echo in 3/17 with EF 22% and RV dysfunction (at [facility] in [place]).  On exam today, she is markedly volume overload with JVD and anasarca. This is complicated by C difficile colitis and and AKI on CKD with creatinine up to 4.44.  Weight is up 30 lbs.  Given suspected component of cardiorenal syndrome and BP relatively soft BP, I am concerned that she may have low output.   - She needs diuresis, but it remains to be seen whether this will be effective with AKI.  Given Lasix 80 mg IV x 1 then start Lasix 15 mg/hr gtt.   - Place CVL (will need left IJ with h/o RIJ DVT).  Use to follow co-ox and CVP.  Still waiting for INR.  - Check co-ox after CVL placed. If low, would start dobutamine 3 mcg/kg/min (hold off on milrinone with significant renal failure).  - Hold Coreg.  - Echo 2. AKI on CKD: Creatinine was 1.49 on 4/8 but has steadily increased.  During March admission at [facility] she required hemodialysis due to presumed cardiorenal syndrome.  Creatinine today is 4.4, suspect combination of cardiorenal syndrome with marked volume overload.  She has C difficile diarrhea but as noted, does not appear  dehydrated.  - As above, check co-ox and add inotrope if she appears to have low output HF. Will arrange CVL this evening.  3. R IJ DVT in 3/17: He is on warfarin.  INR pending. 4. CAD: History not available.  On ASA + Plavix in the past.  This was held due to hematuria at last admission (foley trauma) and need to use warfarin.  Continue atorvastatin.  5. C difficile diarrhea: She was on clindamycin recently for MRSA breast abscess, suspect this led to predisposition.  Per primary service.   Marca Ancona 03/20/2016  4:11 PM

## 2016-03-20 NOTE — ED Provider Notes (Signed)
CSN: [id number]     Arrival date & time 03/20/16  1149 History   First MD Initiated Contact with Patient 03/20/16 1151     Chief Complaint  Patient presents with  . COPD  . Diarrhea     (Consider location/radiation/quality/duration/timing/severity/associated sxs/prior Treatment) HPI Comments: Patient with past medical history of CHF, COPD, hypertension, presents to the ED with a chief complaint of shortness of breath and diarrhea. She is currently staying at [facility] skilled nursing facility. She states that over the past 3 days she has had multiple episodes of foul-smelling diarrhea every day. She reports recent clindamycin use, presumably for a small breast abscess, which is now well healing. Additionally, patient states that she has had some associated shortness of breath. She has a history of CHF, and has bilateral lower extremity swelling which is not new. She denies any fevers, chills or cough. She denies any chest pain or abdominal pain.  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  The history is provided by the patient. No language interpreter was used.    Past Medical History  Diagnosis Date  . Coronary artery disease   . CHF (congestive heart failure) (HCC)   . COPD (chronic obstructive pulmonary disease) (HCC)   . Hypertension    History reviewed. No pertinent past surgical history. No family history on file. Social History  Substance Use Topics  . Smoking status: Former Smoker    Types: Cigarettes    Quit date: 03/03/2006  . Smokeless tobacco: Never Used  . Alcohol Use: No   OB History    No data available     Review of Systems  Constitutional: Negative for fever and chills.  Respiratory: Positive for shortness of breath.   Cardiovascular: Negative for chest pain.  Gastrointestinal: Positive for diarrhea. Negative for nausea, vomiting and constipation.  Genitourinary: Negative for dysuria.  All other systems reviewed and are  negative.     Allergies  Entresto  Home Medications   Prior to Admission medications   Medication Sig Start Date End Date Taking? Authorizing Provider  albuterol (PROVENTIL HFA;VENTOLIN HFA) 108 (90 Base) MCG/ACT inhaler Inhale 2 puffs into the lungs every 6 (six) hours as needed for wheezing or shortness of breath.     Historical Provider, MD  atorvastatin (LIPITOR) 10 MG tablet Take 10 mg by mouth daily.    Historical Provider, MD  beclomethasone (QVAR) 80 MCG/ACT inhaler Inhale 2 puffs into the lungs 2 (two) times daily.    Historical Provider, MD  carvedilol (COREG) 12.5 MG tablet Take 12.5 mg by mouth 2 (two) times daily.    Historical Provider, MD  clindamycin (CLEOCIN) 300 MG capsule Take 300 mg by mouth every 6 (six) hours.    Historical Provider, MD  insulin glargine (LANTUS) 100 UNIT/ML injection Inject 18 Units into the skin at bedtime.    Historical Provider, MD  insulin lispro (HUMALOG) 100 UNIT/ML injection Inject 5 Units into the skin 3 (three) times daily with meals. For CBG > 180    Historical Provider, MD  Inulin (FIBER CHOICE) 1.5 g CHEW Chew 2 tablets by mouth daily as needed (for constipation).     Historical Provider, MD  LORazepam (ATIVAN) 0.5 MG tablet Take 0.5 mg by mouth 2 (two) times daily. Stop date 03/20/16    Historical Provider, MD  omeprazole (PRILOSEC) 20 MG capsule Take 1 capsule (20 mg total) by mouth daily. 03/04/16   Tiffany Neva Seat, PA-C  torsemide (DEMADEX) 100 MG tablet  Take 100 mg by mouth daily. Take 120 mg in the morning. Take 80 mg in the evening    Historical Provider, MD  warfarin (COUMADIN) 2 MG tablet Take 2 mg by mouth daily. Take along with 2.5 mg= 4.5 mg daily    Historical Provider, MD  warfarin (COUMADIN) 2.5 MG tablet Take 2.5 mg by mouth daily. Take along with 2 mg= 4.5 mg daily    Historical Provider, MD   BP 94/53 mmHg  Pulse 62  Resp 18  Wt 133 kg  SpO2 100% Physical Exam  Constitutional: She is oriented to person, place, and time.  She appears well-developed and well-nourished.  HENT:  Head: Normocephalic and atraumatic.  Eyes: Conjunctivae and EOM are normal. Pupils are equal, round, and reactive to light.  Neck: Normal range of motion. Neck supple.  Cardiovascular: Normal rate and regular rhythm.  Exam reveals no gallop and no friction rub.   No murmur heard. Pulmonary/Chest: Effort normal. No respiratory distress. She has no wheezes. She has rales. She exhibits no tenderness.  Bibasilar rales  Abdominal: Soft. Bowel sounds are normal. She exhibits no distension and no mass. There is no tenderness. There is no rebound and no guarding.  No focal abdominal tenderness, no RLQ tenderness or pain at McBurney's point, no RUQ tenderness or Murphy's sign, no left-sided abdominal tenderness, no fluid wave, or signs of peritonitis   Musculoskeletal: Normal range of motion. She exhibits edema. She exhibits no tenderness.  2+ pitting edema  Neurological: She is alert and oriented to person, place, and time.  Skin: Skin is warm and dry.  Psychiatric: She has a normal mood and affect. Her behavior is normal. Judgment and thought content normal.  Nursing note and vitals reviewed.   ED Course  Procedures (including critical care time) Results for orders placed or performed during the hospital encounter of 03/20/16  CBC with Differential/Platelet  Result Value Ref Range   WBC 7.5 4.0 - 10.5 K/uL   RBC 3.32 (L) 3.87 - 5.11 MIL/uL   Hemoglobin 9.0 (L) 12.0 - 15.0 g/dL   HCT 13.0 (L) 86.5 - 78.4 %   MCV 85.5 78.0 - 100.0 fL   MCH 27.1 26.0 - 34.0 pg   MCHC 31.7 30.0 - 36.0 g/dL   RDW 69.6 (H) 29.5 - 28.4 %   Platelets 165 150 - 400 K/uL   Neutrophils Relative % 77 %   Lymphocytes Relative 10 %   Monocytes Relative 9 %   Eosinophils Relative 3 %   Basophils Relative 1 %   Neutro Abs 5.7 1.7 - 7.7 K/uL   Lymphs Abs 0.8 0.7 - 4.0 K/uL   Monocytes Absolute 0.7 0.1 - 1.0 K/uL   Eosinophils Absolute 0.2 0.0 - 0.7 K/uL    Basophils Absolute 0.1 0.0 - 0.1 K/uL   RBC Morphology TARGET CELLS    WBC Morphology TOXIC GRANULATION   Basic metabolic panel  Result Value Ref Range   Sodium 139 135 - 145 mmol/L   Potassium 3.4 (L) 3.5 - 5.1 mmol/L   Chloride 104 101 - 111 mmol/L   CO2 23 22 - 32 mmol/L   Glucose, Bld 77 65 - 99 mg/dL   BUN 68 (H) 6 - 20 mg/dL   Creatinine, Ser 1.32 (H) 0.44 - 1.00 mg/dL   Calcium 7.9 (L) 8.9 - 10.3 mg/dL   GFR calc non Af Amer 10 (L) >60 mL/min   GFR calc Af Amer 11 (L) >60 mL/min   Anion  gap 12 5 - 15  Brain natriuretic peptide  Result Value Ref Range   B Natriuretic Peptide 1489.8 (H) 0.0 - 100.0 pg/mL  I-stat troponin, ED  Result Value Ref Range   Troponin i, poc 0.01 0.00 - 0.08 ng/mL   Comment 3           Dg Chest 2 View  03/20/2016  CLINICAL DATA:  COPD exacerbation with diarrhea for 1.5 weeks. History of recent acute renal failure with temporary dialysis. Additional history of coronary artery disease, congestive heart failure, COPD and hypertension. EXAM: CHEST  2 VIEW COMPARISON:  03/14/2016 and 03/04/2016. FINDINGS: The left subclavian AICD lead appears unchanged. There is stable cardiomegaly, bilateral pleural effusions and mild bibasilar atelectasis. No edema, confluent airspace opacity or pneumothorax. The bones appear unchanged. IMPRESSION: Stable cardiomegaly, small pleural effusions and bibasilar atelectasis. No acute findings. Electronically Signed   By: Carey Bullocks M.D.   On: 03/20/2016 13:22   Dg Chest 2 View  03/14/2016  CLINICAL DATA:  Acute on chronic shortness of breath. EXAM: CHEST  2 VIEW COMPARISON:  March 04, 2016. FINDINGS: Stable cardiomegaly is noted. Single lead pacemaker is unchanged in position. No pneumothorax is noted. Mild bilateral pleural effusions are noted. Bony thorax is unremarkable. No acute pulmonary disease is noted. IMPRESSION: Mild bilateral pleural effusions.  Stable cardiomegaly. Electronically Signed   By: Lupita Raider, M.D.    On: 03/14/2016 15:23   Dg Chest 2 View  03/04/2016  CLINICAL DATA:  Shortness of breath and productive cough. Recent hospital admission, discharged 2 days prior at an outside facility. EXAM: CHEST  2 VIEW COMPARISON:  None. FINDINGS: Single lead pacemaker in place. There is marked cardiomegaly. Small bilateral pleural effusions. Vascular congestion and question of minimal perihilar edema. No confluent airspace disease. Chronic deformity of both shoulders. Body habitus limits evaluation. IMPRESSION: Cardiomegaly with bilateral pleural effusions, vascular congestion and question perihilar edema. Findings consistent with CHF. No prior exams for comparison. Electronically Signed   By: Rubye Oaks M.D.   On: 03/04/2016 01:00    I have personally reviewed and evaluated these images and lab results as part of my medical decision-making.   EKG Interpretation None      MDM   Final diagnoses:  C. difficile colitis  Acute on chronic congestive heart failure, unspecified congestive heart failure type [facility])    Patient from skilled nursing facility, [facility], shortness of breath, and diarrhea. She has a history of COPD and CHF. Shortness of breath presumably from CHF exacerbation, she has a large amount of fluid on her lower extremities. 2+ pitting edema bilaterally. Patient also reports having 3 days worth of foul-smelling diarrhea. The nursing home contacted emergency department, to inform us of positive C. Diff testing today.  I will start patient on Flagyl  TID.  Maintenance fluids for now.  Patient discussed hospitalist, who will admit the patient for further treatment.    Roxy Horseman, PA-C 03/20/16 1409  Raeford Razor, MD 03/26/16 2122

## 2016-03-20 NOTE — Progress Notes (Signed)
LOCATION: [facility]  PCP: PROVIDER NOT IN SYSTEM   Code Status: Full Code  Goals of care: Advanced Directive information Advanced Directives 03/20/2016  Does patient have an advance directive? Yes  Type of Advance Directive Healthcare Power of Attorney  Does patient want to make changes to advanced directive? -  Copy of advanced directive(s) in chart? -     Extended Emergency Contact Information Primary Emergency Contact: Haith,[name] Address: [address]          [place], [place] [address] [place] Home Phone: [phone] Relation: Sister Secondary Emergency Contact: Marcello Moores, [place] Home Phone: [phone] Relation: Sister   Allergies  Allergen Reactions  . Entresto [Sacubitril-Valsartan] Other (See Comments)    On [facility]    Chief Complaint  Patient presents with  . Acute Visit    Worsening renal function. Elevated white count     HPI:  Patient is a 65 y.o. female seen today for acute visit. She continues to be short of breath and mentions not feeling good. She has been nauseous and complaints of abdominal pain. Her loose stool have increased in frequency and she now has abdominal pain. Staff have noticed blood in her stool this am. Her lab work have resulted and shows elevated BNP and worsening kidney function. She also has elevated wbc. She was seen by me last week and adjustment was done to her torsemide dosing with chf exacerbation. She is on continuous oxygen.   Review of Systems:  Constitutional: Negative for fever. Feels weak and tired.  HENT: Negative for headache, congestion, nasal discharge. Eyes: Negative for blurred vision, double vision and discharge.  Respiratory: Negative for wheezing. Positive for dry cough and worsening dyspnea Cardiovascular: Negative for chest pain. Positive for ocassional palpitations and leg swelling.   Gastrointestinal: Negative for heartburn and vomiting.  Positive for nausea, diarrhea and abdominal pain.  Genitourinary: Negative for dysuria.  Musculoskeletal: Negative for back pain  Skin: Negative for itching, rash.  Neurological: Negative for dizziness. Psychiatric/Behavioral: Negative for depression  PMH- DM, HLD, obesity, OSA, CAD, systolic CHF, moderate pulmonary HTN   Medications:   Medication List       This list is accurate as of: 03/20/16 11:49 AM.  Always use your most recent med list.               albuterol 108 (90 Base) MCG/ACT inhaler  Commonly known as:  PROVENTIL HFA;VENTOLIN HFA  Inhale 2 puffs into the lungs every 6 (six) hours as needed for wheezing or shortness of breath.     atorvastatin 10 MG tablet  Commonly known as:  LIPITOR  Take 10 mg by mouth daily.     beclomethasone 80 MCG/ACT inhaler  Commonly known as:  QVAR  Inhale 2 puffs into the lungs 2 (two) times daily.     carvedilol 12.5 MG tablet  Commonly known as:  COREG  Take 12.5 mg by mouth 2 (two) times daily.     clindamycin 300 MG capsule  Commonly known as:  CLEOCIN  Take 300 mg by mouth every 6 (six) hours.     FIBER CHOICE 1.5 g Chew  Generic drug:  Inulin  Chew 2 tablets by mouth daily as needed (for constipation).     insulin glargine 100 UNIT/ML injection  Commonly known as:  LANTUS  Inject 18 Units into the skin at bedtime.     insulin  lispro 100 UNIT/ML injection  Commonly known as:  HUMALOG  Inject 5 Units into the skin 3 (three) times daily with meals. For CBG > 180     LORazepam 0.5 MG tablet  Commonly known as:  ATIVAN  Take 0.5 mg by mouth 2 (two) times daily. Stop date 03/20/16     omeprazole 20 MG capsule  Commonly known as:  PRILOSEC  Take 1 capsule (20 mg total) by mouth daily.     torsemide 100 MG tablet  Commonly known as:  DEMADEX  Take 100 mg by mouth daily. Take 120 mg in the morning. Take 80 mg in the evening     warfarin 2.5 MG tablet  Commonly known as:  COUMADIN  Take 2.5 mg by mouth daily. Take  along with 2 mg= 4.5 mg daily     warfarin 2 MG tablet  Commonly known as:  COUMADIN  Take 2 mg by mouth daily. Take along with 2.5 mg= 4.5 mg daily        Immunizations: Immunization History  Administered Date(s) Administered  . PPD Test 03/01/2016     Physical Exam: Filed Vitals:   03/20/16 1026  BP: 140/73  Pulse: 62  Temp: 98.3 F (36.8 C)  TempSrc: Oral  Resp: 16  Height:  (1.727 m)  Weight: 282 lb (127.914 kg)  SpO2: 96%    General- elderly female, morbidly obese, in moderate distress, short of breath in between sentences Head- normocephalic, atraumatic Nose-  no maxillary or frontal sinus tenderness, no nasal discharge Throat- moist mucus membrane, upper dentures, poor lower dentition Eyes- no pallor, no icterus, no discharge Neck- no cervical lymphadenopathy, JVD + Cardiovascular- normal s1,s2, no murmur, 3+ leg edema Respiratory- bilateral poor air entry, + wheeze, + crackles, on o2 Abdomen- bowel sounds present, soft, tenderness to bilateral lower quadrant Musculoskeletal- able to move all 4 extremities, generalized weakness Neurological- alert and oriented Skin- warm and dry Psychiatry- appears anxious   Labs reviewed: Basic Metabolic Panel:  Recent Labs  16/10/96 03/14/16 1457 03/14/16 1534 03/17/16  NA 143 137 141 138  K 4.7 3.7 3.6 3.4  CL 105 100* 99*  --   CO2 27 27  --   --   GLUCOSE 139* 107* 106*  --   BUN 31* 40* 39* 59*  CREATININE 1.49* 2.13* 2.00* 3.3*  CALCIUM 9.0 8.2*  --   --     CBC:  Recent Labs  03/04/16 03/14/16 1457 03/14/16 1534 03/17/16  WBC 7.5 13.0*  --  11.3  NEUTROABS 5.7 11.0*  --   --   HGB 8.7* 9.3* 11.9* 9.1*  HCT 28.4* 30.2* 35.0* 28*  MCV 87.9 89.3  --   --   PLT 298 136*  --  135*    Radiological Exams: 03/16/16 cxr- no pulmonary infiltrates present   Assessment/Plan  Acute chf exacerbation With elevated BNP, worsening breathing status. Given her worsening renal function, will send her  to ED for further evaluation and management of chf exacerbation. Currently on coreg and torsemide.  Acute on chronic renal impairment with elevated creatinine and worsening GFR. Concern for cardiorenal syndrome with her worsening chf. Her diarrhea is likely worsening it as well. She had similar picture before and had required dialysis in the hospital. Sending patient to the ED for further workup  C.diff colitis Stool was sent for c.diff on Friday and has resulted positive. With her ongoing diarrhea, abdominal pain, hypokalemia and worsening renal function send to ED for  evaluation and treatment. Will need to rule out acute diverticulitis with her gi bleed. Will need h&h checked and possible workup for gi bleed if persists.   Leukocytosis With c.diff colitis. Send to ED as above  Hypokalemia With her on diuresis and her ongoing diarrhea both contributing to this.    Family/ staff Communication: reviewed care plan with patient and nursing supervisor, sending patient to the ED for further evaluation and workup    [name], MD Internal Medicine [facility] [address] [place], [place] [address] Cell Phone (Monday-Friday 8 am - 5 pm): [phone] On Call: [phone] and follow prompts after 5 pm and on weekends Office Phone: [phone] Office Fax: [phone]

## 2016-03-20 NOTE — ED Notes (Signed)
Pt arrives via [facility] from [facility], ems reports pts pcp wanted patient sent here for evaluation of copd exacerbation and diarrhea x1.5 weeks. Pt has hx of acute renal failure with temoporary dialysis x3 days during her recent hospital stay. Pt a/o, nad.

## 2016-03-20 NOTE — ED Notes (Signed)
Attempted to call report, RN on 2W advised this RN they cannot do CVP monitoring on that floor, Dr. Konrad Dolores Made aware, advised he would look into this.

## 2016-03-20 NOTE — ED Notes (Signed)
Admitting MD at bedside.

## 2016-03-21 ENCOUNTER — Inpatient Hospital Stay (HOSPITAL_COMMUNITY): Payer: Medicare PPO

## 2016-03-21 DIAGNOSIS — I509 Heart failure, unspecified: Secondary | ICD-10-CM

## 2016-03-21 DIAGNOSIS — A047 Enterocolitis due to Clostridium difficile: Principal | ICD-10-CM

## 2016-03-21 DIAGNOSIS — N184 Chronic kidney disease, stage 4 (severe): Secondary | ICD-10-CM | POA: Diagnosis present

## 2016-03-21 LAB — C DIFFICILE QUICK SCREEN W PCR REFLEX
C DIFFICILE (CDIFF) TOXIN: NEGATIVE
C DIFFICLE (CDIFF) ANTIGEN: POSITIVE — AB

## 2016-03-21 LAB — CARBOXYHEMOGLOBIN
CARBOXYHEMOGLOBIN: 2 % — AB (ref 0.5–1.5)
METHEMOGLOBIN: 0.6 % (ref 0.0–1.5)
O2 Saturation: 65.6 %
Total hemoglobin: 8.8 g/dL — ABNORMAL LOW (ref 12.0–16.0)

## 2016-03-21 LAB — GLUCOSE, CAPILLARY
GLUCOSE-CAPILLARY: 100 mg/dL — AB (ref 65–99)
GLUCOSE-CAPILLARY: 146 mg/dL — AB (ref 65–99)
GLUCOSE-CAPILLARY: 85 mg/dL (ref 65–99)
Glucose-Capillary: 109 mg/dL — ABNORMAL HIGH (ref 65–99)
Glucose-Capillary: 136 mg/dL — ABNORMAL HIGH (ref 65–99)

## 2016-03-21 LAB — COMPREHENSIVE METABOLIC PANEL
ALBUMIN: 2.1 g/dL — AB (ref 3.5–5.0)
ALT: 11 U/L — ABNORMAL LOW (ref 14–54)
ANION GAP: 12 (ref 5–15)
AST: 22 U/L (ref 15–41)
Alkaline Phosphatase: 110 U/L (ref 38–126)
BILIRUBIN TOTAL: 1.5 mg/dL — AB (ref 0.3–1.2)
BUN: 68 mg/dL — ABNORMAL HIGH (ref 6–20)
CO2: 22 mmol/L (ref 22–32)
Calcium: 8 mg/dL — ABNORMAL LOW (ref 8.9–10.3)
Chloride: 105 mmol/L (ref 101–111)
Creatinine, Ser: 4.12 mg/dL — ABNORMAL HIGH (ref 0.44–1.00)
GFR calc non Af Amer: 10 mL/min — ABNORMAL LOW (ref >60)
GFR, EST AFRICAN AMERICAN: 12 mL/min — AB (ref >60)
GLUCOSE: 81 mg/dL (ref 65–99)
POTASSIUM: 3.8 mmol/L (ref 3.5–5.1)
SODIUM: 139 mmol/L (ref 135–145)
TOTAL PROTEIN: 5.7 g/dL — AB (ref 6.5–8.1)

## 2016-03-21 LAB — ABO/RH: ABO/RH(D): A POS

## 2016-03-21 LAB — ECHOCARDIOGRAM COMPLETE
HEIGHTINCHES: 64 in
WEIGHTICAEL: 4531.2 [oz_av]

## 2016-03-21 LAB — TYPE AND SCREEN
ABO/RH(D): A POS
Antibody Screen: NEGATIVE

## 2016-03-21 LAB — MRSA PCR SCREENING
MRSA BY PCR: NEGATIVE
MRSA BY PCR: NEGATIVE

## 2016-03-21 MED ORDER — METOLAZONE 5 MG PO TABS
5.0000 mg | ORAL_TABLET | Freq: Once | ORAL | Status: AC
  Administered 2016-03-22: 5 mg via ORAL
  Filled 2016-03-21: qty 1

## 2016-03-21 MED ORDER — CETYLPYRIDINIUM CHLORIDE 0.05 % MT LIQD
7.0000 mL | Freq: Two times a day (BID) | OROMUCOSAL | Status: DC
  Administered 2016-03-22 – 2016-04-10 (×33): 7 mL via OROMUCOSAL

## 2016-03-21 MED ORDER — SODIUM CHLORIDE 0.9 % IV SOLN
Freq: Once | INTRAVENOUS | Status: AC
  Administered 2016-03-21: 15:00:00 via INTRAVENOUS

## 2016-03-21 NOTE — Progress Notes (Signed)
Unable to get labs on patient, patient will be going to [facility] gave report to [place] the receiving nurse.

## 2016-03-21 NOTE — Progress Notes (Signed)
Patient states she has left her purse in ED, June has called and I have called and was not able to recover any purse. Also that her sister may have taken, security will call [facility] if any news on purse.

## 2016-03-21 NOTE — Progress Notes (Signed)
  Echocardiogram 2D Echocardiogram has been performed.  Leta Jungling M 03/21/2016, 10:00 AM

## 2016-03-21 NOTE — Progress Notes (Signed)
PROGRESS NOTE  Brandy Dudley ZOX:[id number] DOB: October 01, 1951 DOA: 03/20/2016 PCP: PROVIDER NOT IN SYSTEM Outpatient Specialists:    LOS: 1 day   Brief Narrative: 65 y.o. 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 She has had multiple episodes daily of foul-smelling diarrhea with some blood- per nursing home staff. She was being treated with clindamycin for a breast abscess prior to onset of symptoms- abscess is healing well. Recent increase in Demadex by PCP, weight gain of ~30 lbs over the past month.  Assessment & Plan: Principal Problem:   C. difficile colitis Active Problems:   Physical deconditioning   DVT (deep venous thrombosis), right   Chronic systolic congestive heart failure (HCC)   Cardiomyopathy, ischemic   Coronary artery disease involving native coronary artery of native heart without angina pectoris   Essential hypertension, malignant   Diabetes mellitus type 2 in obese (HCC)   COPD (chronic obstructive pulmonary disease) (HCC)   Chest wall abscess   Obstructive sleep apnea   AICD (automatic cardioverter/defibrillator) present   C. difficile diarrhea   Acute on chronic congestive heart failure (HCC)   Chronic kidney disease (CKD), stage IV (severe) (HCC)  Acute on chronic combined systolic and diastolic CHF - ~30lb weight gain over the past month with increased SOB and swelling, JVD, anasarca - Echo from 01/2016 [facility] in [place]) showed an EF of 22% with RV dysfunction  - in ED: BNP 1489, CXR showing stable cardiomegaly + small pleural effusions + bibasilar atelectasis- no acute findings - heart failure team following - s/p  Lasix last night poor UOP overnight, started 15 mg/hr gtt drip this morning  - during last hospitalizationat [facility] in 03/17 required 3 days of hemodialysis due  to presumed cardiorenal syndrome - Echo this morning showed EEF of 35-40% with grade 2 diastolic dysfunction, mild MR, and severe TR - unable to obtain morning labs due to poor access - plan to move to SDU for CVL placement and CVP/co-ox monitoring plus possible inotropic agents  Acute renal failure on chronic (CKD III) - creatinine 1.49 on 4/8, steadily increased over the past month - creatinine 4.44 on admission- acute failure likely a combination of fluid depletion from diarrhea and third spacing - repeat labs pending, based on central venous pressure and possible inotrope, if renal function does not recover will need nephrology   C. Diff colitis - >1 week of occasionally bloody diarrhea following clindamycin use for a breast abscess - positive C.diff culture at PCP office  - given Flagyl in ED, d/c and started on po vancomycin on admission - had several episodes of diarrhea over the night - continue PO vanc and monitor Hgb for any decreases  Hx of RIJ thrombus - on coumadin - INR 6.38 on admission - FFP per CCM for CVC placement  CAD - ICD in place - no chest pain - continue statin  Hypertension - BP has been soft during admission - likely due to volume depletion from third spacing and diarrhea - continue to hold coreg and monitor pressure  Type 2 diabetes - SSI  Anemia - chronic, 9.0 on admission - continue to follow and transfuse if falls below 8.0  Hypokalemia - 3.4 on admission - replete and follow - check magnesium level  COPD - on continuous O2 at home, satting in 90s -  continue albuterol PRN  DVT prophylaxis: Anticoagulation on coumadin Code Status: Full Family Communication: None at bedside Disposition Plan: Move to SDU Barriers for discharge: Fluid overload, low BPs, worsening kidney function  Consultants:   Heart failure team  Nephrology  Procedures:   2D echo: Study conclusions: Left ventricle: The cavity size was mildly dilated. Wall  thickness was normal. Systolic function was moderately reduced. The estimated ejection fraction was in the range of 35% to 40%. Diffuse hypokinesis. There is akinesis of the inferolateral and inferior myocardium. Features are consistent with a pseudonormal left ventricular filling pattern, with concomitant abnormal relaxation and increased filling pressure (grade 2 diastolic   dysfunction).  Foley: 175cc output  Antimicrobials:  PO vancomycin (04/24 >>)  Subjective: Feels like her breathing is better, still having diarrhea. Appetite is good. Complains about diet  Objective: Filed Vitals:   03/20/16 2028 03/20/16 2104 03/21/16 0613 03/21/16 0712  BP: 110/56  93/57   Pulse: 79  78   Temp:    97.4 F (36.3 C)  TempSrc:    Oral  Resp: 20     Height: 5\' 4"  (1.626 m)     Weight: 128.459 kg (283 lb 3.2 oz)     SpO2: 100% 98% 100%     Intake/Output Summary (Last 24 hours) at 03/21/16 0909 Last data filed at 03/21/16 0600  Gross per 24 hour  Intake 366.75 ml  Output    175 ml  Net 191.75 ml   Filed Weights   03/20/16 1201 03/20/16 2028  Weight: 133 kg (293 lb 3.4 oz) 128.459 kg (283 lb 3.2 oz)    Examination: Constitutional: NAD, calm, comfortable Filed Vitals:   03/20/16 2028 03/20/16 2104 03/21/16 0613 03/21/16 0712  BP: 110/56  93/57   Pulse: 79  78   Temp:    97.4 F (36.3 C)  TempSrc:    Oral  Resp: 20     Height: 5\' 4"  (1.626 m)     Weight: 128.459 kg (283 lb 3.2 oz)     SpO2: 100% 98% 100%    Eyes: PERRL ENMT: Mucous membranes are moist.Normal dentition.  Respiratory: Mild bibasilar rales. Normal respiratory effort. No accessory muscle use.  Cardiovascular: Regular rate and rhythm, no murmurs / rubs / gallops. Bilateral 3+ pitting edema to lower extremities, bilateral 1+ edema to upper extremities. 2+ pedal pulses. +JVD Abdomen: no tenderness. Bowel sounds positive. Obese Skin: healing right breast abscess Neurologic: Moving all extremities. Psychiatric:  Normal judgment and insight. Alert and oriented x 3. Normal mood.    Data Reviewed: I have personally reviewed following labs and imaging studies  CBC:  Recent Labs Lab 03/14/16 1457 03/14/16 1534 03/17/16 03/20/16 1227  WBC 13.0*  --  11.3 7.5  NEUTROABS 11.0*  --   --  5.7  HGB 9.3* 11.9* 9.1* 9.0*  HCT 30.2* 35.0* 28* 28.4*  MCV 89.3  --   --  85.5  PLT 136*  --  135* 165   Basic Metabolic Panel:  Recent Labs Lab 03/14/16 1457 03/14/16 1534 03/17/16 03/20/16 1227  NA 137 141 138 139  K 3.7 3.6 3.4 3.4*  CL 100* 99*  --  104  CO2 27  --   --  23  GLUCOSE 107* 106*  --  77  BUN 40* 39* 59* 68*  CREATININE 2.13* 2.00* 3.3* 4.44*  CALCIUM 8.2*  --   --  7.9*   GFR: Estimated Creatinine Clearance: 16.8 mL/min (by C-G formula based on Cr  of 4.44). Liver Function Tests:  Recent Labs Lab 03/17/16  AST 11*  ALT 8  ALKPHOS 130*   No results for input(s): LIPASE, AMYLASE in the last 168 hours. No results for input(s): AMMONIA in the last 168 hours. Coagulation Profile:  Recent Labs Lab 03/14/16 1457 03/20/16 1227  INR 3.01* 6.38*   Cardiac Enzymes: No results for input(s): CKTOTAL, CKMB, CKMBINDEX, TROPONINI in the last 168 hours. BNP (last 3 results) No results for input(s): PROBNP in the last 8760 hours. HbA1C: No results for input(s): HGBA1C in the last 72 hours. CBG:  Recent Labs Lab 03/20/16 2211 03/20/16 2248 03/20/16 2336  GLUCAP 66 63* 85   Lipid Profile: No results for input(s): CHOL, HDL, LDLCALC, TRIG, CHOLHDL, LDLDIRECT in the last 72 hours. Thyroid Function Tests: No results for input(s): TSH, T4TOTAL, FREET4, T3FREE, THYROIDAB in the last 72 hours. Anemia Panel: No results for input(s): VITAMINB12, FOLATE, FERRITIN, TIBC, IRON, RETICCTPCT in the last 72 hours. Urine analysis:    Component Value Date/Time   COLORURINE AMBER* 03/14/2016 1655   APPEARANCEUR CLOUDY* 03/14/2016 1655   LABSPEC 1.019 03/14/2016 1655   PHURINE 5.0  03/14/2016 1655   GLUCOSEU NEGATIVE 03/14/2016 1655   HGBUR LARGE* 03/14/2016 1655   BILIRUBINUR SMALL* 03/14/2016 1655   KETONESUR 15* 03/14/2016 1655   PROTEINUR 30* 03/14/2016 1655   NITRITE NEGATIVE 03/14/2016 1655   LEUKOCYTESUR TRACE* 03/14/2016 1655   Sepsis Labs: Invalid input(s): PROCALCITONIN, LACTICIDVEN  Recent Results (from the past 240 hour(s))  Urine culture     Status: None   Collection Time: 03/14/16  4:55 PM  Result Value Ref Range Status   Specimen Description URINE, CATHETERIZED  Final   Special Requests NONE  Final   Culture MULTIPLE SPECIES PRESENT, SUGGEST RECOLLECTION  Final   Report Status 03/15/2016 FINAL  Final  MRSA PCR Screening     Status: None   Collection Time: 03/21/16  1:09 AM  Result Value Ref Range Status   MRSA by PCR NEGATIVE NEGATIVE Final    Comment:        The GeneXpert MRSA Assay (FDA approved for NASAL specimens only), is one component of a comprehensive MRSA colonization surveillance program. It is not intended to diagnose MRSA infection nor to guide or monitor treatment for MRSA infections.       Radiology Studies: Dg Chest 2 View  03/20/2016  CLINICAL DATA:  COPD exacerbation with diarrhea for 1.5 weeks. History of recent acute renal failure with temporary dialysis. Additional history of coronary artery disease, congestive heart failure, COPD and hypertension. EXAM: CHEST  2 VIEW COMPARISON:  03/14/2016 and 03/04/2016. FINDINGS: The left subclavian AICD lead appears unchanged. There is stable cardiomegaly, bilateral pleural effusions and mild bibasilar atelectasis. No edema, confluent airspace opacity or pneumothorax. The bones appear unchanged. IMPRESSION: Stable cardiomegaly, small pleural effusions and bibasilar atelectasis. No acute findings. Electronically Signed   By: Carey Bullocks M.D.   On: 03/20/2016 13:22     Scheduled Meds: . atorvastatin  10 mg Oral Daily  . budesonide (PULMICORT) nebulizer solution  0.25 mg  Nebulization BID  . furosemide  80 mg Intravenous Once  . insulin aspart  0-9 Units Subcutaneous TID WC  . LORazepam  0.5 mg Oral BID  . sodium chloride flush  3 mL Intravenous Q12H  . vancomycin  125 mg Oral QID   Continuous Infusions: . furosemide (LASIX) infusion 15 mg/hr (03/20/16 2133)     Pamella Pert, MD, PhD [facility] Pager [phone]  If 7PM-7AM, please contact night-coverage [email or web address] Password [email or web address] 03/21/2016, 9:09 AM

## 2016-03-21 NOTE — Progress Notes (Signed)
Text paged Dr.Gherghe about not being able to get morning labs.

## 2016-03-21 NOTE — Progress Notes (Signed)
Pharmacist Heart Failure Core Measure Documentation  Assessment: Brandy Dudley has an EF documented as 35-40%.  Rationale: Heart failure patients with left ventricular systolic dysfunction (LVSD) and an EF < 40% should be prescribed an angiotensin converting enzyme inhibitor (ACEI) or angiotensin receptor blocker (ARB) at discharge unless a contraindication is documented in the medical record.  This patient is not currently on an ACEI or ARB for HF.  This note is being placed in the record in order to provide documentation that a contraindication to the use of these agents is present for this encounter.  ACE Inhibitor or Angiotensin Receptor Blocker is contraindicated (specify all that apply)  []   ACEI allergy AND ARB allergy []   Angioedema []   Moderate or severe aortic stenosis []   Hyperkalemia []   Hypotension []   Renal artery stenosis [x]   Worsening renal function, preexisting renal disease or dysfunction   [name] 03/21/2016 4:01 PM

## 2016-03-21 NOTE — Progress Notes (Signed)
Patient has had four lab personal to come to room to obtain morning labs unsuccessfully . Per Lynnea Ferrier in lab patient is now reject for morning collection.This nurse will leave message for oncoming MD and report off to oncoming nurse.

## 2016-03-21 NOTE — Care Management Note (Signed)
Case Management Note Donn Pierini RN, BSN Unit 2W-Case Manager [phone]  Patient Details  Name: Brandy Dudley MRN: [id number] Date of Birth: 1950-12-01  Subjective/Objective:      Pt admitted with HF and ?cdiff  , pt on IV lasix gtt and plan to tx to SDU today 4/25          Action/Plan: PTA pt was staying at [facility]- CSW aware and will follow for placement needs at discharge  Expected Discharge Date:                  Expected Discharge Plan:  Skilled Nursing Facility  In-House Referral:  Clinical Social Work  Discharge planning Services  CM Consult  Post Acute Care Choice:    Choice offered to:     DME Arranged:    DME Agency:     HH Arranged:    HH Agency:     Status of Service:  In process, will continue to follow  Medicare Important Message Given:    Date Medicare IM Given:    Medicare IM give by:    Date Additional Medicare IM Given:    Additional Medicare Important Message give by:     If discussed at Long Length of Stay Meetings, dates discussed:    Additional Comments:  Darrold Span, RN 03/21/2016, 10:21 AM

## 2016-03-21 NOTE — Progress Notes (Signed)
Advanced Heart Failure Rounding Note  Referring Physician: Dr. Glade Lloyd PCP: Not in system Primary Cardiologist: Previously in [place]  Subjective:    States she is feeling somewhat better. Still had some diarrhea but she thinks less bloody.  SOB slightly improved.  Remains very swollen.  INR markedly elevated at 6.38 on admit. Will likely need reversal prior to [employer].  Only out 175 cc on lasix gtt at 15 mg/hr.  No labs obtained this morning due to poor venous access. Awaiting Central Line placement.   Objective:   Weight Range: 283 lb 3.2 oz (128.459 kg) Body mass index is 48.59 kg/(m^2).   Vital Signs:   Temp:  [97.4 F (36.3 C)-98.3 F (36.8 C)] 97.4 F (36.3 C) (04/25 0712) Pulse Rate:  [55-80] 78 (04/25 0613) Resp:  [15-21] 20 (04/24 2028) BP: (90-140)/(53-77) 93/57 mmHg (04/25 0613) SpO2:  [94 %-100 %] 100 % (04/25 4098) Weight:  [282 lb (127.914 kg)-293 lb 3.4 oz (133 kg)] 283 lb 3.2 oz (128.459 kg) (04/24 2028) Last BM Date: 03/20/16  Weight change: Filed Weights   03/20/16 1201 03/20/16 2028  Weight: 293 lb 3.4 oz (133 kg) 283 lb 3.2 oz (128.459 kg)    Intake/Output:   Intake/Output Summary (Last 24 hours) at 03/21/16 0836 Last data filed at 03/21/16 0600  Gross per 24 hour  Intake 366.75 ml  Output    175 ml  Net 191.75 ml     Physical Exam: General: Appears older than stated age, NAD.  HEENT: normal Neck: supple. JVP 16 cm +. Carotids 2+ bilat; no bruits. No thyromegaly or nodule noted Cor: PMI nondisplaced. RRR. + TR/MR murmur Lungs: Decreased throughout with basilar crackles. Abdomen: Obese, tight, umbilical hernia, NT, ND, no HSM. +BS  Extremities: no cyanosis, clubbing, rash. 3+ edema up to flanks.  Neuro: Drowsy but orientedx3, cranial nerves grossly intact. moves all 4 extremities w/o difficulty. Affect pleasant  Telemetry: NSR 50-60s  Labs: CBC  Recent Labs  03/20/16 1227  WBC 7.5  NEUTROABS 5.7  HGB 9.0*  HCT  28.4*  MCV 85.5  PLT 165   Basic Metabolic Panel  Recent Labs  03/20/16 1227  NA 139  K 3.4*  CL 104  CO2 23  GLUCOSE 77  BUN 68*  CREATININE 4.44*  CALCIUM 7.9*   Liver Function Tests No results for input(s): AST, ALT, ALKPHOS, BILITOT, PROT, ALBUMIN in the last 72 hours. No results for input(s): LIPASE, AMYLASE in the last 72 hours. Cardiac Enzymes No results for input(s): CKTOTAL, CKMB, CKMBINDEX, TROPONINI in the last 72 hours.  BNP: BNP (last 3 results)  Recent Labs  03/14/16 1457 03/20/16 1227  BNP 1509.6* 1489.8*    ProBNP (last 3 results) No results for input(s): PROBNP in the last 8760 hours.   D-Dimer No results for input(s): DDIMER in the last 72 hours. Hemoglobin A1C No results for input(s): HGBA1C in the last 72 hours. Fasting Lipid Panel No results for input(s): CHOL, HDL, LDLCALC, TRIG, CHOLHDL, LDLDIRECT in the last 72 hours. Thyroid Function Tests No results for input(s): TSH, T4TOTAL, T3FREE, THYROIDAB in the last 72 hours.  Invalid input(s): FREET3  Other results:     Imaging/Studies:  Dg Chest 2 View  03/20/2016  CLINICAL DATA:  COPD exacerbation with diarrhea for 1.5 weeks. History of recent acute renal failure with temporary dialysis. Additional history of coronary artery disease, congestive heart failure, COPD and hypertension. EXAM: CHEST  2 VIEW COMPARISON:  03/14/2016 and 03/04/2016. FINDINGS: The  left subclavian AICD lead appears unchanged. There is stable cardiomegaly, bilateral pleural effusions and mild bibasilar atelectasis. No edema, confluent airspace opacity or pneumothorax. The bones appear unchanged. IMPRESSION: Stable cardiomegaly, small pleural effusions and bibasilar atelectasis. No acute findings. Electronically Signed   By: Carey Bullocks M.D.   On: 03/20/2016 13:22     Latest Echo  Latest Cath   Medications:     Scheduled Medications: . atorvastatin  10 mg Oral Daily  . budesonide (PULMICORT) nebulizer  solution  0.25 mg Nebulization BID  . furosemide  80 mg Intravenous Once  . insulin aspart  0-9 Units Subcutaneous TID WC  . LORazepam  0.5 mg Oral BID  . sodium chloride flush  3 mL Intravenous Q12H  . vancomycin  125 mg Oral QID     Infusions: . furosemide (LASIX) infusion 15 mg/hr (03/20/16 2133)     PRN Medications:  acetaminophen **OR** acetaminophen, albuterol, HYDROcodone-acetaminophen, ondansetron **OR** ondansetron (ZOFRAN) IV   Assessment/Plan   Brandy Dudley is a 65 y.o. admitted from [place] place to IM service per notes for further evaluation for C. Diff colitis,  Acute CHF exacerbation, and ARF on CKD stage III. HF team consulted to assist with fluid management.   1. Acute on chronic systolic CHF: Ischemic cardiomyopathy with history of prior CAD (details not available). She has an ICD, thinks Environmental manager. Last echo in 3/17 with EF 22% and RV dysfunction (at [facility] in [place]). On exam today, she is markedly volume overload with JVD and anasarca. This is complicated by C difficile colitis and and AKI on CKD with creatinine up to 4.44. Weight is up 30 lbs by records. Given suspected component of cardiorenal syndrome and BP relatively soft BP, concerned that she may have low output.  - Continue Lasix 15 mg/hr gtt for now.  Unable to draw morning labs.  - Place CVL (will need left IJ with h/o RIJ DVT) once able ref INR. Use to follow co-ox and CVP.  - Check co-ox after CVL placed. If low, would start dobutamine 3 mcg/kg/min (hold off on milrinone with significant renal failure).  - Hold Coreg.  - Repeat Echo pending.  2. AKI on CKD: Creatinine was 1.49 on 4/8 but has steadily increased.  - Labs pending better access.  - During March admission at [facility] she required hemodialysis due to presumed cardiorenal syndrome.  - Creatinine on admission is 4.4, suspect combination of cardiorenal syndrome with marked volume overload.  - She has C difficile diarrhea but as  noted, does not appear dehydrated.  - As above, check co-ox and add inotrope if she appears to have low output HF. Will arrange CVL 3. R IJ DVT in 3/17:  - INR on admit 6.38. Coumadin on hold. May need reversal to speed up CVL placement.  - Spoke with CCM.  To order 4 units of FFP.  Alert Dr Tyson Alias when 3rd unit is going in for CVL placement.  4. CAD: History not available. On ASA + Plavix in the past. This was held due to hematuria at last admission (foley trauma) and need to use warfarin. Continue atorvastatin.  5. C difficile diarrhea: She was on clindamycin recently for MRSA breast abscess, suspect this led to predisposition. Per primary service.  Length of Stay: 1  Brandy Freer PA-C 03/21/2016, 8:36 AM  Advanced Heart Failure Team Pager [phone] (M-F; 7a - 4p)  Please contact [facility] for night-coverage after hours (4p -7a ) and weekends on [email or web address]  Patient  seen with PA, agree with the above note.  She remains very volume overloaded on exam, weight up about 30 lbs from her baseline.  However, she has diuresed poorly with Lasix gtt.  Unable to draw labs this morning. We need more information at this point => need to send to step-down and place left IJ CVL.  Will check CVP and co-ox, if co-ox is low, would add dobutamine.  She will be getting an echo today as well.  Unfortunately, will have to get FFP to place CVL with INR 6.4 yesterday => risk of worsening volume overload/pulmonary edema but needs IV access.   Diarrhea has slowed, on po vancomycin.   I suspect that her renal function is going to be the limiting factor here.  May end up needing RRT.  Need venous access to get BMET today.   Brandy Dudley 03/21/2016 9:59 AM

## 2016-03-21 NOTE — Procedures (Signed)
Central Venous Catheter Insertion Procedure Note Tahlia Trabue [id number] 10-05-1951  Procedure: Insertion of Central Venous Catheter Indications: Assessment of intravascular volume, Drug and/or fluid administration and Frequent blood sampling  Procedure Details Consent: Risks of procedure as well as the alternatives and risks of each were explained to the (patient/caregiver).  Consent for procedure obtained. Time Out: Verified patient identification, verified procedure, site/side was marked, verified correct patient position, special equipment/implants available, medications/allergies/relevent history reviewed, required imaging and test results available.  Performed  Maximum sterile technique was used including antiseptics, cap, gloves, gown, hand hygiene, mask and sheet. Skin prep: Chlorhexidine; local anesthetic administered A antimicrobial bonded/coated triple lumen catheter was placed in the left internal jugular vein using the Seldinger technique.  Evaluation Blood flow good Complications: No apparent complications Patient did tolerate procedure well. Chest X-ray ordered to verify placement.  CXR: pending.  Procedure performed under direct ultrasound guidance for real time vessel cannulation.      Rutherford Guys, [place] [facility] Pager: [phone]  or [phone] 03/21/2016, 7:58 PM

## 2016-03-21 NOTE — Progress Notes (Signed)
PT Cancellation Note  Patient Details Name: Brandy Dudley MRN: [id number] DOB: November 13, 1951   Cancelled Treatment:    Reason Eval/Treat Not Completed: Medical issues which prohibited therapy. Pt with INR >6 and awaiting CVP line. Will see tomorrow.   Saoirse Legere 03/21/2016, 2:36 PM [facility] PT [phone]

## 2016-03-21 NOTE — Clinical Social Work Note (Signed)
Clinical Social Work Assessment  Patient Details  Name: Brandy Dudley MRN: [id number] Date of Birth: 05/30/1951  Date of referral:  03/21/16               Reason for consult:  Discharge Planning                Permission sought to share information with:  Facility Contact Representative Permission granted to share information::  Yes, Verbal Permission Granted  Name::     Brandy Dudley- Patient's sister  Agency::  [facility]NF[facility]  Relationship::  Brandy Dudley- patient's sister  Contact Information:     Housing/Transportation Living arrangements for the past 2 months:  Skilled Nursing Facility ([facility] for rehab- 2 weeks) Source of Information:  Patient Patient Interpreter Needed:  None Criminal Activity/Legal Involvement Pertinent to Current Situation/Hospitalization:  No - Comment as needed Significant Relationships:  Siblings Lives with:  Siblings Do you feel safe going back to the place where you live?  Yes Need for family participation in patient care:  Yes (Comment)  Care giving concerns:  The patient is agreeable to recommendation of short term rehab at discharge. The Patient would like to build her strength, so she can return home.    Social Worker assessment / plan:  CSW met with patient at bedside to complete assessment. Patient was resting comfortably in bed. CSW explained SNF search/ placement process and answered her questions regarding discharge process. Patient reported she is from [facility] where she was participating in Rehab for two weeks. The patient report her support is her sister Brandy Dudley. Per patient's request CSW called patient sister and shared the patient's plan. CSW also explained SNF search/ and placement process to patient's sister. CSW will continue to follow and wait for PT recommendation.  Employment status:  Retired Insurance information:  Medicare PT Recommendations:  Not assessed at this time Information / Referral to community resources:  Skilled Nursing  Facility  Patient/Family's Response to care:  Patient appears happy with the care she is receiving in the hospital and is appreciative of CSW assistance.   Patient/Family's Understanding of and Emotional Response to Diagnosis, Current Treatment, and Prognosis:  The patient has a good understanding of why she was admitted. She understands the care plan and what she will need post discharge.   Emotional Assessment Appearance:  Appears stated age Attitude/Demeanor/Rapport:   (Patient was welcoming of CSW and appropriate. ) Affect (typically observed):  Accepting, Calm, Appropriate Orientation:  Oriented to Self Alcohol / Substance use:  Not Applicable Psych involvement (Current and /or in the community):  No (Comment)  Discharge Needs  Concerns to be addressed:  Discharge Planning Concerns Readmission within the last 30 days:  No Current discharge risk:  Physical Impairment Barriers to Discharge:  Continued Medical Work up  Brandy Dudley , LCSW [phone] 03/21/2016, 11:29 AM  

## 2016-03-22 ENCOUNTER — Inpatient Hospital Stay (HOSPITAL_COMMUNITY): Payer: Medicare PPO

## 2016-03-22 DIAGNOSIS — I5023 Acute on chronic systolic (congestive) heart failure: Secondary | ICD-10-CM

## 2016-03-22 DIAGNOSIS — I82401 Acute embolism and thrombosis of unspecified deep veins of right lower extremity: Secondary | ICD-10-CM

## 2016-03-22 DIAGNOSIS — I2510 Atherosclerotic heart disease of native coronary artery without angina pectoris: Secondary | ICD-10-CM

## 2016-03-22 DIAGNOSIS — I509 Heart failure, unspecified: Secondary | ICD-10-CM

## 2016-03-22 DIAGNOSIS — I5043 Acute on chronic combined systolic (congestive) and diastolic (congestive) heart failure: Secondary | ICD-10-CM

## 2016-03-22 DIAGNOSIS — E669 Obesity, unspecified: Secondary | ICD-10-CM

## 2016-03-22 DIAGNOSIS — N179 Acute kidney failure, unspecified: Secondary | ICD-10-CM | POA: Insufficient documentation

## 2016-03-22 DIAGNOSIS — I255 Ischemic cardiomyopathy: Secondary | ICD-10-CM

## 2016-03-22 DIAGNOSIS — N184 Chronic kidney disease, stage 4 (severe): Secondary | ICD-10-CM

## 2016-03-22 DIAGNOSIS — J9690 Respiratory failure, unspecified, unspecified whether with hypoxia or hypercapnia: Secondary | ICD-10-CM | POA: Insufficient documentation

## 2016-03-22 DIAGNOSIS — R5381 Other malaise: Secondary | ICD-10-CM

## 2016-03-22 DIAGNOSIS — J9601 Acute respiratory failure with hypoxia: Secondary | ICD-10-CM

## 2016-03-22 DIAGNOSIS — J449 Chronic obstructive pulmonary disease, unspecified: Secondary | ICD-10-CM

## 2016-03-22 DIAGNOSIS — E119 Type 2 diabetes mellitus without complications: Secondary | ICD-10-CM

## 2016-03-22 LAB — CBC
HCT: 26 % — ABNORMAL LOW (ref 36.0–46.0)
Hemoglobin: 8.3 g/dL — ABNORMAL LOW (ref 12.0–15.0)
MCH: 27.2 pg (ref 26.0–34.0)
MCHC: 31.9 g/dL (ref 30.0–36.0)
MCV: 85.2 fL (ref 78.0–100.0)
Platelets: 181 10*3/uL (ref 150–400)
RBC: 3.05 MIL/uL — ABNORMAL LOW (ref 3.87–5.11)
RDW: 17.3 % — ABNORMAL HIGH (ref 11.5–15.5)
WBC: 8.2 10*3/uL (ref 4.0–10.5)

## 2016-03-22 LAB — APTT: aPTT: 57 seconds — ABNORMAL HIGH (ref 24–37)

## 2016-03-22 LAB — PREPARE FRESH FROZEN PLASMA
Unit division: 0
Unit division: 0
Unit division: 0
Unit division: 0

## 2016-03-22 LAB — BASIC METABOLIC PANEL
ANION GAP: 12 (ref 5–15)
BUN: 68 mg/dL — ABNORMAL HIGH (ref 6–20)
CALCIUM: 8.2 mg/dL — AB (ref 8.9–10.3)
CHLORIDE: 101 mmol/L (ref 101–111)
CO2: 26 mmol/L (ref 22–32)
Creatinine, Ser: 4.46 mg/dL — ABNORMAL HIGH (ref 0.44–1.00)
GFR calc non Af Amer: 9 mL/min — ABNORMAL LOW (ref >60)
GFR, EST AFRICAN AMERICAN: 11 mL/min — AB (ref >60)
Glucose, Bld: 162 mg/dL — ABNORMAL HIGH (ref 65–99)
Potassium: 3.2 mmol/L — ABNORMAL LOW (ref 3.5–5.1)
Sodium: 139 mmol/L (ref 135–145)

## 2016-03-22 LAB — PROTIME-INR
INR: 4.12 — ABNORMAL HIGH (ref 0.00–1.49)
INR: 2.81 — ABNORMAL HIGH (ref 0.00–1.49)
PROTHROMBIN TIME: 38.8 s — AB (ref 11.6–15.2)
Prothrombin Time: 29.1 seconds — ABNORMAL HIGH (ref 11.6–15.2)

## 2016-03-22 LAB — CARBOXYHEMOGLOBIN
Carboxyhemoglobin: 1.7 % — ABNORMAL HIGH (ref 0.5–1.5)
Methemoglobin: 0.7 % (ref 0.0–1.5)
O2 Saturation: 71.2 %
TOTAL HEMOGLOBIN: 8.6 g/dL — AB (ref 12.0–16.0)

## 2016-03-22 LAB — TSH: TSH: 1.993 u[IU]/mL (ref 0.350–4.500)

## 2016-03-22 LAB — URINE CULTURE

## 2016-03-22 LAB — GLUCOSE, CAPILLARY
Glucose-Capillary: 157 mg/dL — ABNORMAL HIGH (ref 65–99)
Glucose-Capillary: 197 mg/dL — ABNORMAL HIGH (ref 65–99)
Glucose-Capillary: 166 mg/dL — ABNORMAL HIGH (ref 65–99)

## 2016-03-22 LAB — POCT ACTIVATED CLOTTING TIME
ACTIVATED CLOTTING TIME: 173 s
Activated Clotting Time: 167 seconds

## 2016-03-22 LAB — MAGNESIUM: Magnesium: 2.1 mg/dL (ref 1.7–2.4)

## 2016-03-22 MED ORDER — SODIUM CHLORIDE 0.9% FLUSH
10.0000 mL | Freq: Two times a day (BID) | INTRAVENOUS | Status: DC
  Administered 2016-03-22 – 2016-03-23 (×3): 10 mL
  Administered 2016-03-24: 20 mL
  Administered 2016-03-25: 10 mL
  Administered 2016-03-25: 40 mL
  Administered 2016-03-26 – 2016-04-01 (×11): 10 mL

## 2016-03-22 MED ORDER — SODIUM CHLORIDE 0.9% FLUSH
10.0000 mL | INTRAVENOUS | Status: DC | PRN
  Administered 2016-03-25 – 2016-03-26 (×2): 20 mL
  Filled 2016-03-22 (×2): qty 40

## 2016-03-22 MED ORDER — METOLAZONE 5 MG PO TABS
5.0000 mg | ORAL_TABLET | Freq: Once | ORAL | Status: AC
  Administered 2016-03-22: 5 mg via ORAL
  Filled 2016-03-22: qty 1

## 2016-03-22 MED ORDER — HEPARIN (PORCINE) 2000 UNITS/L FOR CRRT
INTRAVENOUS_CENTRAL | Status: DC | PRN
  Administered 2016-03-25: 09:00:00 via INTRAVENOUS_CENTRAL
  Filled 2016-03-22 (×2): qty 1000

## 2016-03-22 MED ORDER — SODIUM CHLORIDE 0.9 % IV SOLN
Freq: Once | INTRAVENOUS | Status: AC
  Administered 2016-03-22: 13:00:00 via INTRAVENOUS

## 2016-03-22 MED ORDER — HEPARIN SODIUM (PORCINE) 1000 UNIT/ML DIALYSIS
1000.0000 [IU] | INTRAMUSCULAR | Status: DC | PRN
  Filled 2016-03-22: qty 6

## 2016-03-22 MED ORDER — PRISMASOL BGK 4/2.5 32-4-2.5 MEQ/L IV SOLN
INTRAVENOUS | Status: DC
  Administered 2016-03-22 – 2016-03-24 (×3): via INTRAVENOUS_CENTRAL
  Filled 2016-03-22 (×5): qty 5000

## 2016-03-22 MED ORDER — HEPARIN BOLUS VIA INFUSION (CRRT)
1000.0000 [IU] | INTRAVENOUS | Status: DC | PRN
  Filled 2016-03-22: qty 1000

## 2016-03-22 MED ORDER — LORAZEPAM 0.5 MG PO TABS
0.5000 mg | ORAL_TABLET | Freq: Two times a day (BID) | ORAL | Status: DC | PRN
  Administered 2016-03-24 – 2016-04-10 (×9): 0.5 mg via ORAL
  Filled 2016-03-22 (×9): qty 1

## 2016-03-22 MED ORDER — PRISMASOL BGK 4/2.5 32-4-2.5 MEQ/L IV SOLN
INTRAVENOUS | Status: DC
  Administered 2016-03-22 – 2016-03-26 (×26): via INTRAVENOUS_CENTRAL
  Filled 2016-03-22 (×32): qty 5000

## 2016-03-22 MED ORDER — SODIUM CHLORIDE 0.9 % IJ SOLN
250.0000 [IU]/h | INTRAMUSCULAR | Status: DC
Start: 2016-03-22 — End: 2016-03-23
  Administered 2016-03-22: 250 [IU]/h via INTRAVENOUS_CENTRAL
  Filled 2016-03-22 (×6): qty 2

## 2016-03-22 MED ORDER — PRISMASOL BGK 4/2.5 32-4-2.5 MEQ/L IV SOLN
INTRAVENOUS | Status: DC
  Administered 2016-03-22 – 2016-03-24 (×4): via INTRAVENOUS_CENTRAL
  Filled 2016-03-22 (×7): qty 5000

## 2016-03-22 MED ORDER — HEPARIN SODIUM (PORCINE) 1000 UNIT/ML IJ SOLN
4000.0000 [IU] | Freq: Once | INTRAMUSCULAR | Status: AC
  Administered 2016-03-26: 4000 [IU] via INTRAVENOUS
  Filled 2016-03-22: qty 4

## 2016-03-22 MED ORDER — POTASSIUM CHLORIDE CRYS ER 20 MEQ PO TBCR
40.0000 meq | EXTENDED_RELEASE_TABLET | Freq: Once | ORAL | Status: AC
  Administered 2016-03-22: 40 meq via ORAL
  Filled 2016-03-22: qty 2

## 2016-03-22 NOTE — Procedures (Signed)
Hemodialysis Catheter Insertion Procedure Note Brandy Dudley [id number] 07/09/1951  Procedure: Insertion of Hemodialysis Catheter Indications: CVVHD.  Procedure Details Consent: Risks of procedure as well as the alternatives and risks of each were explained to the (patient/caregiver).  Consent for procedure obtained. Time Out: Verified patient identification, verified procedure, site/side was marked, verified correct patient position, special equipment/implants available, medications/allergies/relevent history reviewed, required imaging and test results available.  Performed  Maximum sterile technique was used including antiseptics, cap, gloves, gown, hand hygiene, mask and sheet. Skin prep: Chlorhexidine; local anesthetic administered A antimicrobial bonded/coated triple lumen catheter was placed in the right femoral vein due to multiple attempts, no other available access using the Seldinger technique.  Evaluation Blood flow good Complications: No apparent complications Patient did tolerate procedure well.  Procedure performed under direct ultrasound guidance for real time vessel cannulation.      Brandy Dudley, [place] [facility] Pgr: [phone]  or [phone] 03/22/2016, 4:45 PM   [place]  Brandy Dudley. Brandy Alias, MD, FACP Pgr: [phone] [place] [facility]

## 2016-03-22 NOTE — Progress Notes (Signed)
Advanced Heart Failure Rounding Note  Referring Physician: Dr. Glade Lloyd PCP: Not in system Primary Cardiologist: Previously in [place]  Subjective:    Poor urine output. Breathing stable.  Remains markedly volume overloaded.   INR markedly elevated at 6.38 on admit. Down to 4.12 this am after 4 units of FFP.   Only out 250 cc on lasix gtt at 20 mg/hr.  Creatinine at 4.46 this am. Coox 71.2%  Objective:   Weight Range: 288 lb 2.3 oz (130.7 kg) Body mass index is 49.43 kg/(m^2).   Vital Signs:   Temp:  [93.2 F (34 C)-98.7 F (37.1 C)] 98.7 F (37.1 C) (04/26 0340) Pulse Rate:  [58-84] 70 (04/26 0340) Resp:  [8-23] 8 (04/26 0340) BP: (87-108)/(35-71) 94/63 mmHg (04/25 2215) SpO2:  [93 %-100 %] 100 % (04/26 0340) Weight:  [282 lb 4.8 oz (128.05 kg)-288 lb 2.3 oz (130.7 kg)] 288 lb 2.3 oz (130.7 kg) (04/26 0600) Last BM Date: 03/21/16  Weight change: Filed Weights   03/20/16 2028 03/21/16 1315 03/22/16 0600  Weight: 283 lb 3.2 oz (128.459 kg) 282 lb 4.8 oz (128.05 kg) 288 lb 2.3 oz (130.7 kg)    Intake/Output:   Intake/Output Summary (Last 24 hours) at 03/22/16 0749 Last data filed at 03/22/16 9147  Gross per 24 hour  Intake 1997.67 ml  Output    253 ml  Net 1744.67 ml     Physical Exam: General: Appears older than stated age, NAD.  HEENT: normal Neck: supple. JVP 16 cm +. Carotids 2+ bilat; no bruits. No thyromegaly or nodule noted Cor: PMI nondisplaced. RRR. + TR/MR murmur Lungs: Decreased throughout with scattered crackles. Abdomen: Obese, tight, umbilical hernia, non-tender, mild/mod distention, no HSM. +BS  Extremities: no cyanosis, clubbing, rash. 3-4+ edema up to flanks.  Neuro: Drowsy but orientedx3, cranial nerves grossly intact. moves all 4 extremities w/o difficulty. Affect pleasant  Telemetry: NSR 70s  Labs: CBC  Recent Labs  03/20/16 1227 03/22/16 0520  WBC 7.5 8.2  NEUTROABS 5.7  --   HGB 9.0* 8.3*  HCT 28.4* 26.0*  MCV 85.5  85.2  PLT 165 181   Basic Metabolic Panel  Recent Labs  03/21/16 0807 03/22/16 0520  NA 139 139  K 3.8 3.2*  CL 105 101  CO2 22 26  GLUCOSE 81 162*  BUN 68* 68*  CREATININE 4.12* 4.46*  CALCIUM 8.0* 8.2*  MG  --  2.1   Liver Function Tests  Recent Labs  03/21/16 0807  AST 22  ALT 11*  ALKPHOS 110  BILITOT 1.5*  PROT 5.7*  ALBUMIN 2.1*   No results for input(s): LIPASE, AMYLASE in the last 72 hours. Cardiac Enzymes No results for input(s): CKTOTAL, CKMB, CKMBINDEX, TROPONINI in the last 72 hours.  BNP: BNP (last 3 results)  Recent Labs  03/14/16 1457 03/20/16 1227  BNP 1509.6* 1489.8*    ProBNP (last 3 results) No results for input(s): PROBNP in the last 8760 hours.   D-Dimer No results for input(s): DDIMER in the last 72 hours. Hemoglobin A1C No results for input(s): HGBA1C in the last 72 hours. Fasting Lipid Panel No results for input(s): CHOL, HDL, LDLCALC, TRIG, CHOLHDL, LDLDIRECT in the last 72 hours. Thyroid Function Tests  Recent Labs  03/22/16 0510  TSH 1.993    Other results:     Imaging/Studies:  Dg Chest 2 View  03/20/2016  CLINICAL DATA:  COPD exacerbation with diarrhea for 1.5 weeks. History of recent acute renal failure with temporary  dialysis. Additional history of coronary artery disease, congestive heart failure, COPD and hypertension. EXAM: CHEST  2 VIEW COMPARISON:  03/14/2016 and 03/04/2016. FINDINGS: The left subclavian AICD lead appears unchanged. There is stable cardiomegaly, bilateral pleural effusions and mild bibasilar atelectasis. No edema, confluent airspace opacity or pneumothorax. The bones appear unchanged. IMPRESSION: Stable cardiomegaly, small pleural effusions and bibasilar atelectasis. No acute findings. Electronically Signed   By: Carey Bullocks M.D.   On: 03/20/2016 13:22   Dg Chest Port 1 View  03/21/2016  CLINICAL DATA:  Patient status post central line placement. EXAM: PORTABLE CHEST 1 VIEW COMPARISON:   Chest radiograph 03/20/2016. FINDINGS: Single lead AICD device overlies the left hemi thorax, lead is stable in position. Interval insertion central venous catheter with tip projecting over the superior vena cava. Stable marked cardiomegaly. Pulmonary vascular redistribution. No large area of pulmonary consolidation. No pleural effusion or pneumothorax. IMPRESSION: Center venous catheter tip projects over the superior vena cava. Marked cardiomegaly. Electronically Signed   By: Annia Belt M.D.   On: 03/21/2016 20:48    Latest Echo  Latest Cath   Medications:     Scheduled Medications: . antiseptic oral rinse  7 mL Mouth Rinse BID  . atorvastatin  10 mg Oral Daily  . budesonide (PULMICORT) nebulizer solution  0.25 mg Nebulization BID  . insulin aspart  0-9 Units Subcutaneous TID WC  . potassium chloride  40 mEq Oral Once  . sodium chloride flush  3 mL Intravenous Q12H  . vancomycin  125 mg Oral QID    Infusions: . furosemide (LASIX) infusion 20 mg/hr (03/22/16 0602)    PRN Medications: acetaminophen **OR** acetaminophen, albuterol, HYDROcodone-acetaminophen, LORazepam, ondansetron **OR** ondansetron (ZOFRAN) IV   Assessment/Plan   Rheagan Gavigan is a 65 y.o. admitted from [facility] to IM service per notes for further evaluation for C. Diff colitis,  Acute CHF exacerbation, and ARF on CKD stage III. HF team consulted to assist with fluid management.   1. Acute on chronic systolic CHF: Ischemic cardiomyopathy with history of prior CAD (details not available). She has an ICD, thinks Environmental manager. Last echo in 3/17 with EF 22% and RV dysfunction (at [facility] in [place]). She remains markedly volume overload with JVD and anasarca. This is complicated by C difficile colitis and and AKI on CKD with creatinine up to 4.46. Weight is up at least 30 lbs by records. Given suspected component of cardiorenal syndrome and BP relatively soft BP, concerned that she may have low output.  -  Continue Lasix 20 mg/hr gtt for now.  She remains oliguric with elevated creatinine. Will place renal consult.  - Coox normal at 71.2%. CVP  - Hold Coreg.  - Echo 03/21/16 35-40% with Grade 2 DD, mod dilated RV, mild LAE, mod RAE, Severe TR, Peak PA 38 mm Hg. 2. AKI on CKD: Creatinine was 1.49 on 4/8 but has steadily increased.  - Creatinine remains elevated with oliguria. Will involve renal.  - During March admission at [facility] she required hemodialysis due to presumed cardiorenal syndrome.   - She has C difficile diarrhea but as noted, does not appear dehydrated.  - Coox stable, no role for inotropes at this time.  3. R IJ DVT in 3/17:  - INR on admit 6.38. Down to 4.12 this am after 4 units FFP yesterday.  4. CAD: History not available. On ASA + Plavix in the past. This was held due to hematuria at last admission (foley trauma) and need to use warfarin.  Continue atorvastatin.  5. C difficile diarrhea: She was on clindamycin recently for MRSA breast abscess, suspect this led to predisposition. Per primary service.  Length of Stay: 2  Graciella Freer PA-C 03/22/2016, 7:49 AM  Advanced Heart Failure Team Pager [phone] (M-F; 7a - 4p)  Please contact [facility] for night-coverage after hours (4p -7a ) and weekends on [email or web address]  Patient seen with PA, agree with the above note.    1. Acute on chronic systolic CHF: Ischemic cardiomyopathy by history.  Echo this admission with EF 35-40%, inferior/inferolateral akinesis, moderately dilated RV.  She had minimal UOP yesterday on Lasix gtt + metolazone (250 cc).  CVP around 25.  Co-ox adequate at 71%, suggesting that she is not in a low output state.  She remains markedly volume overloaded.  She required HD for volume management last hospitalization.  - Continue Lasix 20 mg/hr and will give metolazone 5 mg twice today.  - I suspect that diuresis is going to be ineffective and that she will need HD for volume management. Will contact  renal service. 2. AKI on CKD: Creatinine 1.49 on 4/8 (shortly after last discharge).  It appears to have trended up since, at 4.46 today.  She is oliguric.  Intractable volume overload.  Think she will need HD here, may need to be long-term given inability to manage volume with meds.  3. CAD: History not available, off ASA and Plavix prior to admission and on warfarin for R IJ DVT.  Continue statin and anticoagulation.  4. C difficile diarrhea: Continue po vancomycin.  Diarrhea slowing.  5. R IJ DVT: 3/17, related to IJ catheter last hospitalization.  Continue anticoagulation (supratherapeutic).  6. Will need work with PT.   Marca Ancona 03/22/2016 11:06 AM

## 2016-03-22 NOTE — Consult Note (Signed)
PULMONARY / CRITICAL CARE MEDICINE   Name: Brandy Dudley MRN: [id number] DOB: [birth year]    ADMISSION DATE:  03/20/2016 CONSULTATION DATE:  03/20/2016  REFERRING MD:  Dr. 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 Diff colitis and acute respiratory distress/AoCHF. She was brought from [facility]. She was recently hospitalized from 3/15 to 4/05 in [place], requiring HD at that time. She has had minimal urine output since admission depite being on lasix gtt and metolazone. Co-ox has been adequate at 71% suggesting she is not in a low output state. Remains markedly volume overloaded. Progressive worsening renal function since admission up to Cr of 4.48 today. Nephrology was consulted today who recommends CVVHD initiation. PCCM called for HD cath placement and transfer to the ICU for further care.   PAST MEDICAL HISTORY :  She  has a past medical history of Coronary artery disease; CHF (congestive heart failure) (HCC); COPD (chronic obstructive pulmonary disease) (HCC); and Hypertension.  PAST SURGICAL HISTORY: She  has no past surgical history on file.  Allergies  Allergen Reactions  . Entresto [Sacubitril-Valsartan] Other (See Comments)    On MAR    No current facility-administered medications on file prior to encounter.   Current Outpatient Prescriptions on File Prior to Encounter  Medication Sig  . albuterol (PROVENTIL HFA;VENTOLIN HFA) 108 (90 Base) MCG/ACT inhaler Inhale 2 puffs into the lungs every 6 (six) hours as needed for wheezing or shortness of breath.   Marland Kitchen atorvastatin (LIPITOR) 10 MG tablet Take 10 mg by mouth daily.  . beclomethasone (QVAR) 80 MCG/ACT inhaler Inhale 2 puffs into the lungs 2 (two) times daily.  . carvedilol (COREG) 12.5 MG tablet Take 12.5 mg by  mouth 2 (two) times daily.  . insulin glargine (LANTUS) 100 UNIT/ML injection Inject 18 Units into the skin at bedtime.  . insulin lispro (HUMALOG) 100 UNIT/ML injection Inject 5 Units into the skin 3 (three) times daily with meals. For CBG > 180  . LORazepam (ATIVAN) 0.5 MG tablet Take 0.5 mg by mouth 2 (two) times daily. Stop date 03/20/16  . omeprazole (PRILOSEC) 20 MG capsule Take 1 capsule (20 mg total) by mouth daily.  Marland Kitchen torsemide (DEMADEX) 100 MG tablet Take 20-100 mg by mouth See admin instructions. Per MAR, pt given 100mg  in am,  20mg  in afternoon, and 80mg  in evening on 4/23  . warfarin (COUMADIN) 2 MG tablet Take 2 mg by mouth daily. Take along with 2.5 mg= 4.5 mg daily  . warfarin (COUMADIN) 2.5 MG tablet Take 2.5 mg by mouth daily. Take along with 2 mg= 4.5 mg daily  . Inulin (FIBER CHOICE) 1.5 g CHEW Chew 2 tablets by mouth daily as needed (for constipation).     FAMILY HISTORY:  Her has no family status information on file.   SOCIAL HISTORY: She  reports that she quit smoking about 10 years ago. Her smoking use included Cigarettes. She has never used smokeless tobacco. She reports that she does not drink alcohol or use illicit drugs.  REVIEW OF SYSTEMS:   Review of systems negative except as noted in HPI and below.  SUBJECTIVE:  Complains of shortness of breath. No chest pain. Minimal abdominal pain.   VITAL SIGNS: BP 86/53 mmHg  Pulse 72  Temp(Src) 98.6 F (37 C) (Oral)  Resp 16  Ht  (1.626 m)  Wt 288 lb 2.3 oz (130.7 kg)  BMI 49.43 kg/m2  SpO2 100%  HEMODYNAMICS: CVP:  [23 mmHg-27 mmHg] 26 mmHg  VENTILATOR SETTINGS:    INTAKE / OUTPUT: I/O last 3 completed shifts: In: 2364.4 [P.O.:630; I.V.:682.4; Blood:1052] Out: 428 [Urine:425; Stool:3]  PHYSICAL EXAMINATION: General:  Chronically ill appearing obese female, lying in bed, in NAD Neuro: AAOx4, no focal deficits HEENT:  EOMI, PERRLA, Cardiovascular:  RRR, +systolic murmur, +JVD Lungs: Difficult  to auscultate due to body habitus, decreased breath sounds diffusely with scattered crackles Abdomen: Obese, distended abdomen, minimally tender to palpation, marked edema at flanks, BS+ Skin: warm, dry, 3-4+ pitting edema up to flanks   LABS:  BMET  Recent Labs Lab 03/20/16 1227 03/21/16 0807 03/22/16 0520  NA 139 139 139  K 3.4* 3.8 3.2*  CL 104 105 101  CO2 BUN 68* 68* 68*  CREATININE 4.44* 4.12* 4.46*  GLUCOSE 77 81 162*    Electrolytes  Recent Labs Lab 03/20/16 1227 03/21/16 0807 03/22/16 0520  CALCIUM 7.9* 8.0* 8.2*  MG  --   --  2.1    CBC  Recent Labs Lab 03/17/16 03/20/16 1227 03/22/16 0520  WBC 11.3 7.5 8.2  HGB 9.1* 9.0* 8.3*  HCT 28* 28.4* 26.0*  PLT 135* 165 181    Coag's  Recent Labs Lab 03/20/16 1227 03/22/16 0520  INR 6.38* 4.12*    Sepsis Markers No results for input(s): LATICACIDVEN, PROCALCITON, O2SATVEN in the last 168 hours.  ABG No results for input(s): PHART, PCO2ART, PO2ART in the last 168 hours.  Liver Enzymes  Recent Labs Lab 03/17/16 03/21/16 0807  AST 11* 22  ALT 8 11*  ALKPHOS 130* 110  BILITOT  --  1.5*  ALBUMIN  --  2.1*    Cardiac Enzymes No results for input(s): TROPONINI, PROBNP in the last 168 hours.  Glucose  Recent Labs Lab 03/21/16 0617 03/21/16 1123 03/21/16 1603 03/21/16 1959 03/22/16 0810 03/22/16 1152  GLUCAP 100* 109* 136* 146* 157* 197*    Imaging US Renal  03/22/2016  CLINICAL DATA:  Acute kidney insufficiency, chronic kidney disease EXAM: RENAL / URINARY TRACT ULTRASOUND COMPLETE COMPARISON:  None. FINDINGS: Right Kidney: Length: 13.2 cm. Mild increased cortical echogenicity suspicious for medical renal disease. No hydronephrosis or renal mass. No renal calculi. Left Kidney: Length: 14 cm in length. Subtle mild increased echogenicity No mass or hydronephrosis visualized. Bladder: Not visualized. Limited visualization of the left kidney due to bowel gas. IMPRESSION: 1. No  hydronephrosis. No renal mass. Bilateral renal mild increased echogenicity suspicious for medical renal disease. Limited visualization of the left kidney due to bowel gas. Electronically Signed   By: Natasha Mead M.D.   On: 03/22/2016 10:15   Dg Chest Port 1 View  03/21/2016  CLINICAL DATA:  Patient status post central line placement. EXAM: PORTABLE CHEST 1 VIEW COMPARISON:  Chest radiograph 03/20/2016. FINDINGS: Single lead AICD device overlies the left hemi thorax, lead is stable in position. Interval insertion central venous catheter with tip projecting over the superior vena cava. Stable marked cardiomegaly. Pulmonary vascular redistribution. No large area of pulmonary consolidation. No pleural effusion or pneumothorax. IMPRESSION: Center venous catheter tip projects over the superior vena cava. Marked cardiomegaly. Electronically Signed   By: Annia Belt M.D.   On: 03/21/2016 20:48   STUDIES:    CULTURES: 4/25 Urine Culture >> Multiple species  4/25 C. Diff >> Ag+ Toxin -  ANTIBIOTICS: 4/24 Vancomycin PO  SIGNIFICANT EVENTS: 4/24 admitted for C. Diff colitis and acute respiratory distress/AoCHF 4/26 transferred to ICU for CVVHD due to AKI/CKD related to decompensated biventricular CHF with massive volume overload developing hypotension.   LINES/TUBES: 4/25 LIJ CVL >>> 4/26 R fem HD cath >>>   DISCUSSION: 65 year old female with CHF (EF 30-35%, grade 2 DD), failed diuresis.  Now with worsening oliguric renal failure / probable cardiorenal syndrome.  Seen by nephrology who requests transfer to ICU for initiation of CVVHD.   ASSESSMENT / PLAN:  CARDIOVASCULAR A:  Probable cardiorenal syndrome. Hx combined CHF (echo from 03/21/16 with EF 35-40%, grade 2DD), ICM, HTN, CAD, RIJ DVT (on warfarin). P:  Heart failure team following, appreciate the assistance. Volume removal via CVVH. May require vasopressor support. Follow CVP's, co-ox. Continue outpatient atorvastatin. Hold  outpatient warfarin, carvedilol, torsemide.  RENAL A:  AoCKD - now with worsening oliguric renal failure. Hypokalemia. P:  Nephrology following, to start CVVH today. Appreciate the assistance. Replace electrolytes as indicated. BMP in AM.  PULMONARY A: Acute hypoxic respiratory failure - due to CHF / volume overload. Hx COPD. PAH by echo - PAP 38 on echo from 03/21/16. P:  Continue supplemental O2 as needed to maintain SpO2 > 92%. Volume removal via CVVH (failed diuresis). Continue BD's. CXR in AM.  GASTROINTESTINAL A:  Nutrition. P:  Heart healthy diet.  HEMATOLOGIC A:  Supratherapeutic INR - on warfarin due to RIJ DVT. She received 4u FFP 04/25 for CVL placement and has received 3u FFP today 04/26 for HD cath placement. Anemia - chronic. VTE Prophylaxis. P:  Hold warfarin for now. Monitor INR. Transfuse for Hgb < 7. SCD's. CBC in AM.  INFECTIOUS A:  C.difficile colitis. P:  Abx as above (vanco).   ENDOCRINE A:  DM. P:  SSI.  NEUROLOGIC A:  Anxiety. P:  Continue low dose lorazepam.  Family updated: None.  Interdisciplinary Family Meeting v Palliative Care Meeting: Due by: 03/28/16.  CC time: 35 minutes.   Rutherford Guys, [place] [facility] Pager: [phone] or [phone] 03/22/2016, 3:06 PM  STAFF NOTE: I, Rory Percy, MD FACP have personally reviewed patient's available data, including medical history, events of note, physical examination and test results as part of my evaluation. I have discussed with resident/NP and other care providers such as pharmacist, RN and RRT. In addition, I personally evaluated patient and elicited key findings of: Awake, follows commands, gross anasarca, obese, distended abdomen, pcxr c/w pulm edema large heart, renal notes reviewed, she has borderline BP but svo2 is encouraging, progressive renal failure, with borderline MAP agree to cvvhd,  concern continues heroics will not improve outcome, place hd cath and start cvvhd, if MAp less 60 would add levophed, cvp grossly elevated, consider cortisol with borderline MAP, pcxr repeat in am with neg balance goals on cvvhd, wheezing intermittent related to flat position and fluid overload, oral vanc per renal, follow output, inr noted, repeat in am , ffp given for hd catheter placement again, will need to discuss goals with pt and family, may need aline, not required at this time  Mcarthur Rossetti. Tyson Alias, MD, FACP Pgr: [phone] [place] [facility] 03/22/2016 4:29 PM

## 2016-03-22 NOTE — Progress Notes (Signed)
CHAMP ([place] antimicrobial stewardship program) Note  - recommend to continue to treat for C.difficile with oral vanco 125mg  QID. If clinically worsen, increase dose to 500mg  QID - discontinue any PPI if not needed - avoid use of antibiotics associated with cdifficile such as clindamycin or fluoroquinolones  Krystyne Tewksbury B. Drue Second MD MPH [facility] [phone]

## 2016-03-22 NOTE — Consult Note (Signed)
[facility] Renal Consultation Note  Requesting MD: Dr Bubba Camp Indication for Consultation:  Cardiorenal syndrome, Acute on chronic renal failure  HPI: Brandy Dudley is a 65 y.o. female who was admitted on 03/20/16 for C Diff colitis and Acute respiratory distress/ AoCHF.  She has a past medical history of CAD, CHF, Ischemic cardiomyopathy, COPD, HTN, DM2, OSA, DVT on coumadin.  Patient brought to hospital from [facility].  At that facility, she was given Torsemide (157m am and 825mpm) without much relief of her edema.  [place].  Per records, she was hospitalized from 3/15 to 4/05.  Unsure of what this patient's dry weight is but she was noted to weight 275 lb on 4/16.  Unsure of baseline Cr but labs obtained in March 2017 by SNF showed Cr 1.8-2.0.  Patient reports that she has had marked edema in her LE and abdomen since she developed diarrhea on Friday.  She notes that she has had decreased UOP over the last week.  She endorses malaise and SOB that has also been present for the last week.  At baseline, she notes she occasionally uses O2 but is unable to tell me how much.  She notes she did not follow up with/ establish with a nephrologist [place] She reports well controlled HTN, DM2.  [facility]  CREATININE  Date/Time Value Ref Range Status  03/17/2016 3.3* 0.5 - 1.1 mg/dL Final  02/24/2016 2.0* 0.5 - 1.1 mg/dL Final  02/23/2016 1.8* 0.5 - 1.1 mg/dL Final   CREATININE, SER  Date/Time Value Ref Range Status  03/22/2016 05:20 AM 4.46* 0.44 - 1.00 mg/dL Final  03/21/2016 08:07 AM 4.12* 0.44 - 1.00 mg/dL Final  03/20/2016 12:27 PM 4.44* 0.44 - 1.00 mg/dL Final  03/14/2016 03:34 PM 2.00* 0.44 - 1.00 mg/dL Final  03/14/2016 02:57 PM 2.13* 0.44 - 1.00 mg/dL Final  03/04/2016 12:00 AM 1.49* 0.44 - 1.00 mg/dL Final     PMHx:   Past Medical  History  Diagnosis Date  . Coronary artery disease   . [place]  . COPD (chronic obstructive pulmonary disease) [place]  . Hypertension     History reviewed. No pertinent past surgical history.  Family Hx: No family history on file.  Social History:  reports that she quit smoking about 10 years ago. Her smoking use included Cigarettes. She has never used smokeless tobacco. She reports that she does not drink alcohol or use illicit drugs.  Allergies:  Allergies  Allergen Reactions  . Entresto [Sacubitril-Valsartan] Other (See Comments)    On MAR    Medications: Prior to Admission medications   Medication Sig Start Date End Date Taking? Authorizing Provider  acetaminophen (TYLENOL) 325 MG tablet Take 650 mg by mouth every 6 (six) hours as needed for mild pain.   Yes Historical Provider, MD  albuterol (PROVENTIL HFA;VENTOLIN HFA) 108 (90 Base) MCG/ACT inhaler Inhale 2 puffs into the lungs every 6 (six) hours as needed for wheezing or shortness of breath.    Yes Historical Provider, MD  atorvastatin (LIPITOR) 10 MG tablet Take 10 mg by mouth daily.   Yes Historical Provider, MD  beclomethasone (QVAR) 80 MCG/ACT inhaler Inhale 2 puffs into the lungs 2 (two) times daily.   Yes Historical Provider, MD  carvedilol (COREG) 12.5 MG tablet Take 12.5 mg by mouth 2 (two) times  daily.   Yes Historical Provider, MD  insulin glargine (LANTUS) 100 UNIT/ML injection Inject 18 Units into the skin at bedtime.   Yes Historical Provider, MD  insulin lispro (HUMALOG) 100 UNIT/ML injection Inject 5 Units into the skin 3 (three) times daily with meals. For CBG > 180   Yes Historical Provider, MD  LORazepam (ATIVAN) 0.5 MG tablet Take 0.5 mg by mouth 2 (two) times daily. Stop date 03/20/16   Yes Historical Provider, MD  omeprazole (PRILOSEC) 20 MG capsule Take 1 capsule (20 mg total) by mouth daily. 03/04/16  Yes Tiffany Carlota Raspberry, PA-C  torsemide (DEMADEX) 100 MG tablet Take 20-100 mg  by mouth See admin instructions. Per MAR, pt given 149m in am,  235min afternoon, and 8054mn evening on 4/23   Yes Historical Provider, MD  warfarin (COUMADIN) 2 MG tablet Take 2 mg by mouth daily. Take along with 2.5 mg= 4.5 mg daily   Yes Historical Provider, MD  warfarin (COUMADIN) 2.5 MG tablet Take 2.5 mg by mouth daily. Take along with 2 mg= 4.5 mg daily   Yes Historical Provider, MD  Inulin (FIBER CHOICE) 1.5 g CHEW Chew 2 tablets by mouth daily as needed (for constipation).     Historical Provider, MD    I have reviewed the patient's current medications.  Labs:  Results for orders placed or performed during the hospital encounter of 03/20/16 (from the past 48 hour(s))  CBC with Differential/Platelet     Status: Abnormal   Collection Time: 03/20/16 12:27 PM  Result Value Ref Range   WBC 7.5 4.0 - 10.5 K/uL   RBC 3.32 (L) 3.87 - 5.11 MIL/uL   Hemoglobin 9.0 (L) 12.0 - 15.0 g/dL   HCT 28.4 (L) 36.0 - 46.0 %   MCV 85.5 78.0 - 100.0 fL   MCH 27.1 26.0 - 34.0 pg   MCHC 31.7 30.0 - 36.0 g/dL   RDW 17.3 (H) 11.5 - 15.5 %   Platelets 165 150 - 400 K/uL   Neutrophils Relative % 77 %   Lymphocytes Relative 10 %   Monocytes Relative 9 %   Eosinophils Relative 3 %   Basophils Relative 1 %   Neutro Abs 5.7 1.7 - 7.7 K/uL   Lymphs Abs 0.8 0.7 - 4.0 K/uL   Monocytes Absolute 0.7 0.1 - 1.0 K/uL   Eosinophils Absolute 0.2 0.0 - 0.7 K/uL   Basophils Absolute 0.1 0.0 - 0.1 K/uL   RBC Morphology TARGET CELLS     Comment: POLYCHROMASIA PRESENT   WBC Morphology TOXIC GRANULATION     Comment: VACUOLATED NEUTROPHILS INCREASED BANDS (>20% BANDS)   Basic metabolic panel     Status: Abnormal   Collection Time: 03/20/16 12:27 PM  Result Value Ref Range   Sodium 139 135 - 145 mmol/L   Potassium 3.4 (L) 3.5 - 5.1 mmol/L   Chloride 104 101 - 111 mmol/L   CO2 23 22 - 32 mmol/L   Glucose, Bld 77 65 - 99 mg/dL   BUN 68 (H) 6 - 20 mg/dL   Creatinine, Ser 4.44 (H) 0.44 - 1.00 mg/dL   Calcium 7.9  (L) 8.9 - 10.3 mg/dL   GFR calc non Af Amer 10 (L) >60 mL/min   GFR calc Af Amer 11 (L) >60 mL/min    Comment: (NOTE) The eGFR has been calculated using the CKD EPI equation. This calculation has not been validated in all clinical situations. eGFR's persistently <60 mL/min signify possible Chronic Kidney Disease.  Anion gap 12 5 - 15  Brain natriuretic peptide     Status: Abnormal   Collection Time: 03/20/16 12:27 PM  Result Value Ref Range   B Natriuretic Peptide 1489.8 (H) 0.0 - 100.0 pg/mL  Protime-INR     Status: Abnormal   Collection Time: 03/20/16 12:27 PM  Result Value Ref Range   Prothrombin Time 54.0 (H) 11.6 - 15.2 seconds    Comment: REPEATED TO VERIFY   INR 6.38 (HH) 0.00 - 1.49    Comment: REPEATED TO VERIFY CRITICAL RESULT CALLED TO, READ BACK BY AND VERIFIED WITH: CARLAN,C RN '@[id number]'  BY GRINSTEAD,C 4.24.17   I-stat troponin, ED     Status: None   Collection Time: 03/20/16 12:35 PM  Result Value Ref Range   Troponin i, poc 0.01 0.00 - 0.08 ng/mL   Comment 3            Comment: Due to the release kinetics of cTnI, a negative result within the first hours of the onset of symptoms does not rule out myocardial infarction with certainty. If myocardial infarction is still suspected, repeat the test at appropriate intervals.   Glucose, capillary     Status: None   Collection Time: 03/20/16 10:11 PM  Result Value Ref Range   Glucose-Capillary 66 65 - 99 mg/dL   Comment 1 Notify RN    Comment 2 Document in Chart   Glucose, capillary     Status: Abnormal   Collection Time: 03/20/16 10:48 PM  Result Value Ref Range   Glucose-Capillary 63 (L) 65 - 99 mg/dL   Comment 1 Notify RN    Comment 2 Document in Chart   Glucose, capillary     Status: None   Collection Time: 03/20/16 11:36 PM  Result Value Ref Range   Glucose-Capillary 85 65 - 99 mg/dL   Comment 1 Notify RN    Comment 2 Document in Chart   MRSA PCR Screening     Status: None   Collection Time: 03/21/16   1:09 AM  Result Value Ref Range   MRSA by PCR NEGATIVE NEGATIVE    Comment:        The GeneXpert MRSA Assay (FDA approved for NASAL specimens only), is one component of a comprehensive MRSA colonization surveillance program. It is not intended to diagnose MRSA infection nor to guide or monitor treatment for MRSA infections.   Glucose, capillary     Status: Abnormal   Collection Time: 03/21/16  6:17 AM  Result Value Ref Range   Glucose-Capillary 100 (H) 65 - 99 mg/dL   Comment 1 Notify RN    Comment 2 Document in Chart   Comprehensive metabolic panel     Status: Abnormal   Collection Time: 03/21/16  8:07 AM  Result Value Ref Range   Sodium 139 135 - 145 mmol/L   Potassium 3.8 3.5 - 5.1 mmol/L    Comment: SLIGHT HEMOLYSIS   Chloride 105 101 - 111 mmol/L   CO2 22 22 - 32 mmol/L   Glucose, Bld 81 65 - 99 mg/dL   BUN 68 (H) 6 - 20 mg/dL   Creatinine, Ser 4.12 (H) 0.44 - 1.00 mg/dL   Calcium 8.0 (L) 8.9 - 10.3 mg/dL   Total Protein 5.7 (L) 6.5 - 8.1 g/dL   Albumin 2.1 (L) 3.5 - 5.0 g/dL   AST 22 15 - 41 U/L   ALT 11 (L) 14 - 54 U/L   Alkaline Phosphatase 110 38 - 126 U/L  Total Bilirubin 1.5 (H) 0.3 - 1.2 mg/dL   GFR calc non Af Amer 10 (L) >60 mL/min   GFR calc Af Amer 12 (L) >60 mL/min    Comment: (NOTE) The eGFR has been calculated using the CKD EPI equation. This calculation has not been validated in all clinical situations. eGFR's persistently <60 mL/min signify possible Chronic Kidney Disease.    Anion gap 12 5 - 15  Glucose, capillary     Status: Abnormal   Collection Time: 03/21/16 11:23 AM  Result Value Ref Range   Glucose-Capillary 109 (H) 65 - 99 mg/dL   Comment 1 Notify RN    Comment 2 Document in Chart   MRSA PCR Screening     Status: None   Collection Time: 03/21/16  1:33 PM  Result Value Ref Range   MRSA by PCR NEGATIVE NEGATIVE    Comment:        The GeneXpert MRSA Assay (FDA approved for NASAL specimens only), is one component of  a comprehensive MRSA colonization surveillance program. It is not intended to diagnose MRSA infection nor to guide or monitor treatment for MRSA infections.   Type and screen     Status: None   Collection Time: 03/21/16  1:50 PM  Result Value Ref Range   ABO/RH(D) A POS    Antibody Screen NEG    Sample Expiration 03/24/2016   ABO/Rh     Status: None   Collection Time: 03/21/16  1:50 PM  Result Value Ref Range   ABO/RH(D) A POS   Prepare fresh frozen plasma     Status: None   Collection Time: 03/21/16  1:56 PM  Result Value Ref Range   Unit Number [id number]    Blood Component Type THAWED PLASMA    Unit division 00    Status of Unit ISSUED,FINAL    Transfusion Status OK TO TRANSFUSE    Unit Number [id number]    Blood Component Type THAWED PLASMA    Unit division 00    Status of Unit ISSUED,FINAL    Transfusion Status OK TO TRANSFUSE    Unit Number [id number]    Blood Component Type THWPLS APHR1    Unit division 00    Status of Unit ISSUED,FINAL    Transfusion Status OK TO TRANSFUSE    Unit Number [id number]    Blood Component Type THAWED PLASMA    Unit division 00    Status of Unit ISSUED,FINAL    Transfusion Status OK TO TRANSFUSE   Glucose, capillary     Status: Abnormal   Collection Time: 03/21/16  4:03 PM  Result Value Ref Range   Glucose-Capillary 136 (H) 65 - 99 mg/dL   Comment 1 Capillary Specimen   Glucose, capillary     Status: Abnormal   Collection Time: 03/21/16  7:59 PM  Result Value Ref Range   Glucose-Capillary 146 (H) 65 - 99 mg/dL   Comment 1 Capillary Specimen   C difficile quick scan w PCR reflex     Status: Abnormal   Collection Time: 03/21/16  9:03 PM  Result Value Ref Range   C Diff antigen POSITIVE (A) NEGATIVE   C Diff toxin NEGATIVE NEGATIVE   C Diff interpretation      C. difficile present, but toxin not detected. This indicates colonization. In most cases, this does not require treatment. If patient has signs and symptoms  consistent with colitis, consider treatment. Requires ENTERIC precautions.  Carboxyhemoglobin     Status: Abnormal  Collection Time: 03/21/16 10:20 PM  Result Value Ref Range   Total hemoglobin 8.8 (L) 12.0 - 16.0 g/dL   O2 Saturation 65.6 %   Carboxyhemoglobin 2.0 (H) 0.5 - 1.5 %   Methemoglobin 0.6 0.0 - 1.5 %  TSH     Status: None   Collection Time: 03/22/16  5:10 AM  Result Value Ref Range   TSH 1.993 0.350 - 4.500 uIU/mL  Protime-INR     Status: Abnormal   Collection Time: 03/22/16  5:20 AM  Result Value Ref Range   Prothrombin Time 38.8 (H) 11.6 - 15.2 seconds   INR 4.12 (H) 0.00 - 0.93  Basic metabolic panel     Status: Abnormal   Collection Time: 03/22/16  5:20 AM  Result Value Ref Range   Sodium 139 135 - 145 mmol/L   Potassium 3.2 (L) 3.5 - 5.1 mmol/L   Chloride 101 101 - 111 mmol/L   CO2 26 22 - 32 mmol/L   Glucose, Bld 162 (H) 65 - 99 mg/dL   BUN 68 (H) 6 - 20 mg/dL   Creatinine, Ser 4.46 (H) 0.44 - 1.00 mg/dL   Calcium 8.2 (L) 8.9 - 10.3 mg/dL   GFR calc non Af Amer 9 (L) >60 mL/min   GFR calc Af Amer 11 (L) >60 mL/min    Comment: (NOTE) The eGFR has been calculated using the CKD EPI equation. This calculation has not been validated in all clinical situations. eGFR's persistently <60 mL/min signify possible Chronic Kidney Disease.    Anion gap 12 5 - 15  CBC     Status: Abnormal   Collection Time: 03/22/16  5:20 AM  Result Value Ref Range   WBC 8.2 4.0 - 10.5 K/uL   RBC 3.05 (L) 3.87 - 5.11 MIL/uL   Hemoglobin 8.3 (L) 12.0 - 15.0 g/dL   HCT 26.0 (L) 36.0 - 46.0 %   MCV 85.2 78.0 - 100.0 fL   MCH 27.2 26.0 - 34.0 pg   MCHC 31.9 30.0 - 36.0 g/dL   RDW 17.3 (H) 11.5 - 15.5 %   Platelets 181 150 - 400 K/uL  Magnesium     Status: None   Collection Time: 03/22/16  5:20 AM  Result Value Ref Range   Magnesium 2.1 1.7 - 2.4 mg/dL  Carboxyhemoglobin     Status: Abnormal   Collection Time: 03/22/16  5:40 AM  Result Value Ref Range   Total hemoglobin 8.6 (L)  12.0 - 16.0 g/dL   O2 Saturation 71.2 %   Carboxyhemoglobin 1.7 (H) 0.5 - 1.5 %   Methemoglobin 0.7 0.0 - 1.5 %  Glucose, capillary     Status: Abnormal   Collection Time: 03/22/16  8:10 AM  Result Value Ref Range   Glucose-Capillary 157 (H) 65 - 99 mg/dL     ROS:  Pertinent items noted in HPI and remainder of comprehensive ROS otherwise negative.  Physical Exam: Filed Vitals:   03/22/16 0700 03/22/16 0800  BP:  103/61  Pulse: 72 73  Temp:  98.7 F (37.1 C)  Resp: 1 13     General: ill appearing elderly female, lying in bed, [place] in place HEENT: EOMI, PERRL, poor dentition, MMM Neck: JVD difficult to assess due to girth of neck Heart: RRR Lungs: difficult to auscultate, breath sounds appear globally decreased. [place] in place at 3L Abdomen: obese, NT, + distended w/ marked edema at flanks, +BS GU: foley catheter in place draining small amounts of reddish urine. Extremities: Warm,  3-4+ LE edema that extends to sacrum/ flank Skin: no anterior skin breakdown, posterior side not assessed Neuro: drowsy but rouses to voice and follows commands, responds appropriately to questioning.  Assessment/Plan: 1.Renal- Acute on chronic kidney disease, Cardiorenal syndrome.  Baseline Cr appears to be 1.8-2.0.  Cr today 4.46. K 3.2. BUN 68. Ca 8.2.  Needs HD access, CCM to place.  INR 4.12.  Will initiate CVVHD @ 200/h.  May need pressor support to tolerate CVVHD. 2. Hypertension/volume  - Very little UOP (250cc).  Markedly fluid overloaded, hypotensive to 97/60 during my exam.  Discontinue Lasix gtt and Metolazone. 4. Anemia  - hgb 8.3. 5. H/o DVT - INR 4.12.  Pharmacy following/ management per primary 6. CAD/iCM/CHF- cards managing. 7. DM2: SSI per primary 8. C Diff colitis- on Vanc PO per primary.   Ronnie Doss, DO 03/22/2016, 10:54 AM     I have seen and examined this patient and agree with plan as outlined by Dr. Lajuana Ripple.  Ms. Mutch has had AKI/CKD related to decompensated  biventricular CHF with massive volume overload and now with hypotension.  She is quite tachypnic and uncomfortable at the present.  She has had HD in the past only to have recurrence of cardiorenal syndrome.  Will ask PCCM to place HD cath for CVVHD initiation to help with her massive volume overload.  I am unsure of her longterm renal prognosis and don't think she would make a great HD candidate due to her poor functional status and cardiac issues, however she wants to pursue RRT for now.  Will continue to follow and d/c lasix gtt and metolazone once CVVHD is initiated.  She will likely require more permanent access, however her cardiopulmonary status is too tenuous to address this today.  She will be transferred to the ICU and Cardiology may need to initiate meds to assist with her hypotension and volume removal. Governor Rooks Marvine Encalade,MD 03/22/2016 12:42 PM

## 2016-03-22 NOTE — Progress Notes (Signed)
[facility] Progress Note  Patient: Brandy Dudley RCV:[id number]   PCP: PROVIDER NOT IN SYSTEM DOB: 04-02-1951   DOA: 03/20/2016   DOS: 03/22/2016   Date of Service: the patient was seen and examined on 03/22/2016 Outpatient Specialists:none  Subjective: The patient is complaining of right-sided chest pain. Patient denies having any other acute complaint. Also compresses of some diffuse abdominal pain. Also has constipation. No nausea no vomiting. Nutrition: Tolerating oral diet  Brief hospital course: Patient was admitted on 03/20/2016, with complaint of shortness of breath and diarrhea, was found to have acute on chronic CHF as well as C. difficile colitis. Patient was recently found to have a breast assessment was started on clindamycin, her abscess is healing well but she started having diarrhea after that. She also has significant weight gain of 30 pounds over last 1 month. Since hospitalization patient has been on oral vancomycin and the diarrhea is getting better. Patient is also on IV Lasix and cardiology has been following up with the patient. With worsening renal function and significant third spacing patient is likely to deteriorate further and will be transferred to ICU. Currently further plan is nephrology will attempt CVVHD, and the patient will be transferred to ICU.  Assessment and Plan: 1. Acute on chronic congestive heart failure (HCC) More than 30 pound weight gain since last one month. Significant anasarca on evaluation and third spacing. Recent echocardiogram shows EF of less than 30% with RV dysfunction. Echo here shows 35-40% EF with diastolic dysfunction, severe TR. Cardiology was consulted and the patient has been started on high-dose Lasix despite which patient does not have any significant urine output. Patient required dialysis during her recent hospitalization at [facility]. Nephrology consulted and the patient will be getting an HD catheter as well as transferred to ICU for  CVVHD.  2. C. difficile colitis. Likely due to recent use of clindamycin.  Initially given Flagyl due to worsening patient is currently on oral vancomycin and will finish a total 14 day treatment course. Diarrhea is improving and will continue to closely monitor.  4. Right IJ thrombosis. Patient has been on Coumadin but her INR has remained supratherapeutic since admission. Patient has received FFP for central line placement. Patient is receiving another FFP today for hemodialysis catheter placement.  5. Coronary artery disease. Chronic systolic CHF. ICD implant. Patient currently being followed up with cardiology in the hospital. Will be transferred to step down unit  6. Type 2 diabetes mellitus. On sliding scale insulin. Hemoglobin A1c 6.4 on admission.  7. Anemia likely chronic. Humulin 9.0 on admission. No active bleeding reported by patient. Monitor H&H.  8. Chronic COPD, chronic hypoxic respiratory failure on oxygen at home. Continue albuterol.  9. Chronic anxiety. Patient has on lorazepam as scheduled which I would change to when necessary.  Activity: physical therapy consulted Bowel regimen: last BM 03/22/2016 Diet: Cardiac and renal diet carb modified DVT Prophylaxis: on therapeutic anticoagulation.  Advance goals of care discussion: Full code  Family Communication: no family was present at bedside, at the time of interview.    Disposition:  Barriers to safe discharge: Improvement in clinical condition  Consultants: Cardiology, nephrology, critical care Procedures: Central line placement, hemodialysis catheter placement, echocardiogram  Antibiotics: Anti-infectives    Start     Dose/Rate Route Frequency Ordered Stop   03/20/16 1800  vancomycin (VANCOCIN) 50 mg/mL oral solution 125 mg     125 mg Oral 4 times daily 03/20/16 1637 04/03/16 1759   03/20/16 1230  metroNIDAZOLE (  FLAGYL) IVPB 500 mg  Status:  Discontinued     500 mg 100 mL/hr over 60 Minutes  Intravenous Every 8 hours 03/20/16 1218 03/20/16 1538        Intake/Output Summary (Last 24 hours) at 03/22/16 1546 Last data filed at 03/22/16 1334  Gross per 24 hour  Intake 2407.34 ml  Output    304 ml  Net 2103.34 ml   Filed Weights   03/20/16 2028 03/21/16 1315 03/22/16 0600  Weight: 128.459 kg (283 lb 3.2 oz) 128.05 kg (282 lb 4.8 oz) 130.7 kg (288 lb 2.3 oz)    Objective: Physical Exam: Filed Vitals:   03/22/16 1349 03/22/16 1400 03/22/16 1408 03/22/16 1500  BP:  86/53  92/60  Pulse: 66 66 72 68  Temp: 99 F (37.2 C)  98.6 F (37 C) 98 F (36.7 C)  TempSrc: Oral  Oral Oral  Resp: 11 9 16 17   Height:      Weight:      SpO2: 100% 100% 100% 100%     General: Appear in moderate distress, no Rash; Oral Mucosa moist. Cardiovascular: S1 and S2 Present, aortic systolic Murmur, difficult to assess JVD Respiratory: Bilateral Air entry present and bilateral Crackles, no wheezes Abdomen: Bowel Sound present, Soft and no tenderness Extremities: bilateral Pedal edema, no calf tenderness Neurology: Grossly no focal neuro deficit.  Data Reviewed: CBC:  Recent Labs Lab 03/17/16 03/20/16 1227 03/22/16 0520  WBC 11.3 7.5 8.2  NEUTROABS  --  5.7  --   HGB 9.1* 9.0* 8.3*  HCT 28* 28.4* 26.0*  MCV  --  85.5 85.2  PLT 135* 165 181   Basic Metabolic Panel:  Recent Labs Lab 03/17/16 03/20/16 1227 03/21/16 0807 03/22/16 0520  NA 138 139 139 139  K 3.4 3.4* 3.8 3.2*  CL  --  104 105 101  CO2  --  23 22 26   GLUCOSE  --  77 81 162*  BUN 59* 68* 68* 68*  CREATININE 3.3* 4.44* 4.12* 4.46*  CALCIUM  --  7.9* 8.0* 8.2*  MG  --   --   --  2.1   GFR: Estimated Creatinine Clearance: 16.9 mL/min (by C-G formula based on Cr of 4.46). Liver Function Tests:  Recent Labs Lab 03/17/16 03/21/16 0807  AST 11* 22  ALT 8 11*  ALKPHOS 130* 110  BILITOT  --  1.5*  PROT  --  5.7*  ALBUMIN  --  2.1*   No results for input(s): LIPASE, AMYLASE in the last 168 hours. No  results for input(s): AMMONIA in the last 168 hours. Coagulation Profile:  Recent Labs Lab 03/20/16 1227 03/22/16 0520  INR 6.38* 4.12*   Cardiac Enzymes: No results for input(s): CKTOTAL, CKMB, CKMBINDEX, TROPONINI in the last 168 hours. BNP (last 3 results) No results for input(s): PROBNP in the last 8760 hours. HbA1C: No results for input(s): HGBA1C in the last 72 hours. CBG:  Recent Labs Lab 03/21/16 1123 03/21/16 1603 03/21/16 1959 03/22/16 0810 03/22/16 1152  GLUCAP 109* 136* 146* 157* 197*   Lipid Profile: No results for input(s): CHOL, HDL, LDLCALC, TRIG, CHOLHDL, LDLDIRECT in the last 72 hours. Thyroid Function Tests:  Recent Labs  03/22/16 0510  TSH 1.993   Anemia Panel: No results for input(s): VITAMINB12, FOLATE, FERRITIN, TIBC, IRON, RETICCTPCT in the last 72 hours. Urine analysis:    Component Value Date/Time   COLORURINE AMBER* 03/14/2016 1655   APPEARANCEUR CLOUDY* 03/14/2016 1655   LABSPEC 1.019 03/14/2016 1655  PHURINE 5.0 03/14/2016 1655   GLUCOSEU NEGATIVE 03/14/2016 1655   HGBUR LARGE* 03/14/2016 1655   BILIRUBINUR SMALL* 03/14/2016 1655   KETONESUR 15* 03/14/2016 1655   PROTEINUR 30* 03/14/2016 1655   NITRITE NEGATIVE 03/14/2016 1655   LEUKOCYTESUR TRACE* 03/14/2016 1655   Sepsis Labs: @LABRCNTIP (procalcitonin:4,lacticidven:4)  ) Recent Results (from the past 240 hour(s))  Urine culture     Status: None   Collection Time: 03/14/16  4:55 PM  Result Value Ref Range Status   Specimen Description URINE, CATHETERIZED  Final   Special Requests NONE  Final   Culture MULTIPLE SPECIES PRESENT, SUGGEST RECOLLECTION  Final   Report Status 03/15/2016 FINAL  Final  Urine culture     Status: Abnormal   Collection Time: 03/21/16  1:09 AM  Result Value Ref Range Status   Specimen Description URINE, CATHETERIZED  Final   Special Requests NONE  Final   Culture MULTIPLE SPECIES PRESENT, SUGGEST RECOLLECTION (A)  Final   Report Status  03/22/2016 FINAL  Final  MRSA PCR Screening     Status: None   Collection Time: 03/21/16  1:09 AM  Result Value Ref Range Status   MRSA by PCR NEGATIVE NEGATIVE Final    Comment:        The GeneXpert MRSA Assay (FDA approved for NASAL specimens only), is one component of a comprehensive MRSA colonization surveillance program. It is not intended to diagnose MRSA infection nor to guide or monitor treatment for MRSA infections.   MRSA PCR Screening     Status: None   Collection Time: 03/21/16  1:33 PM  Result Value Ref Range Status   MRSA by PCR NEGATIVE NEGATIVE Final    Comment:        The GeneXpert MRSA Assay (FDA approved for NASAL specimens only), is one component of a comprehensive MRSA colonization surveillance program. It is not intended to diagnose MRSA infection nor to guide or monitor treatment for MRSA infections.   C difficile quick scan w PCR reflex     Status: Abnormal   Collection Time: 03/21/16  9:03 PM  Result Value Ref Range Status   C Diff antigen POSITIVE (A) NEGATIVE Final   C Diff toxin NEGATIVE NEGATIVE Final   C Diff interpretation   Final    C. difficile present, but toxin not detected. This indicates colonization. In most cases, this does not require treatment. If patient has signs and symptoms consistent with colitis, consider treatment. Requires ENTERIC precautions.      Studies: US Renal  03/22/2016  CLINICAL DATA:  Acute kidney insufficiency, chronic kidney disease EXAM: RENAL / URINARY TRACT ULTRASOUND COMPLETE COMPARISON:  None. FINDINGS: Right Kidney: Length: 13.2 cm. Mild increased cortical echogenicity suspicious for medical renal disease. No hydronephrosis or renal mass. No renal calculi. Left Kidney: Length: 14 cm in length. Subtle mild increased echogenicity No mass or hydronephrosis visualized. Bladder: Not visualized. Limited visualization of the left kidney due to bowel gas. IMPRESSION: 1. No hydronephrosis. No renal mass. Bilateral  renal mild increased echogenicity suspicious for medical renal disease. Limited visualization of the left kidney due to bowel gas. Electronically Signed   By: Natasha Mead M.D.   On: 03/22/2016 10:15   Dg Chest Port 1 View  03/21/2016  CLINICAL DATA:  Patient status post central line placement. EXAM: PORTABLE CHEST 1 VIEW COMPARISON:  Chest radiograph 03/20/2016. FINDINGS: Single lead AICD device overlies the left hemi thorax, lead is stable in position. Interval insertion central venous catheter with tip projecting  over the superior vena cava. Stable marked cardiomegaly. Pulmonary vascular redistribution. No large area of pulmonary consolidation. No pleural effusion or pneumothorax. IMPRESSION: Center venous catheter tip projects over the superior vena cava. Marked cardiomegaly. Electronically Signed   By: Annia Belt M.D.   On: 03/21/2016 20:48     Scheduled Meds: . antiseptic oral rinse  7 mL Mouth Rinse BID  . atorvastatin  10 mg Oral Daily  . budesonide (PULMICORT) nebulizer solution  0.25 mg Nebulization BID  . insulin aspart  0-9 Units Subcutaneous TID WC  . sodium chloride flush  10-40 mL Intracatheter Q12H  . sodium chloride flush  3 mL Intravenous Q12H  . vancomycin  125 mg Oral QID   Continuous Infusions:  PRN Meds: acetaminophen **OR** acetaminophen, albuterol, HYDROcodone-acetaminophen, LORazepam, ondansetron **OR** ondansetron (ZOFRAN) IV, sodium chloride flush  Time spent: 30 minutes  Author: Lynden Oxford, MD [facility] Pager: [phone] 03/22/2016 3:46 PM  If 7PM-7AM, please contact night-coverage at [email or web address], password [facility]

## 2016-03-22 NOTE — Progress Notes (Addendum)
PT Cancellation Note  Patient Details Name: Ezlynn Cobo MRN: [id number] DOB: 1951-06-07   Cancelled Treatment:    Reason Eval/Treat Not Completed: Medical issues which prohibited therapy (Pt with fluid overload per nursing and going to dialysis.)  Will check back as able.  Thanks.    Tawni Millers F 03/22/2016, 11:06 AM  [facility] [phone] (pager)

## 2016-03-23 ENCOUNTER — Inpatient Hospital Stay (HOSPITAL_COMMUNITY): Payer: Medicare PPO

## 2016-03-23 DIAGNOSIS — A09 Infectious gastroenteritis and colitis, unspecified: Secondary | ICD-10-CM

## 2016-03-23 DIAGNOSIS — R197 Diarrhea, unspecified: Secondary | ICD-10-CM | POA: Insufficient documentation

## 2016-03-23 DIAGNOSIS — Z95810 Presence of automatic (implantable) cardiac defibrillator: Secondary | ICD-10-CM

## 2016-03-23 LAB — POCT ACTIVATED CLOTTING TIME
ACTIVATED CLOTTING TIME: 167 s
ACTIVATED CLOTTING TIME: 178 s
ACTIVATED CLOTTING TIME: 188 s
ACTIVATED CLOTTING TIME: 188 s
Activated Clotting Time: 178 seconds
Activated Clotting Time: 178 seconds
Activated Clotting Time: 188 seconds
Activated Clotting Time: 183 seconds
Activated Clotting Time: 183 seconds
Activated Clotting Time: 183 seconds

## 2016-03-23 LAB — GLUCOSE, CAPILLARY
GLUCOSE-CAPILLARY: 156 mg/dL — AB (ref 65–99)
Glucose-Capillary: 127 mg/dL — ABNORMAL HIGH (ref 65–99)
Glucose-Capillary: 150 mg/dL — ABNORMAL HIGH (ref 65–99)
Glucose-Capillary: 158 mg/dL — ABNORMAL HIGH (ref 65–99)

## 2016-03-23 LAB — PREPARE FRESH FROZEN PLASMA
UNIT DIVISION: 0
UNIT DIVISION: 0
Unit division: 0

## 2016-03-23 LAB — CBC
HEMATOCRIT: 25 % — AB (ref 36.0–46.0)
HEMATOCRIT: 26.7 % — AB (ref 36.0–46.0)
HEMOGLOBIN: 7.9 g/dL — AB (ref 12.0–15.0)
Hemoglobin: 8.5 g/dL — ABNORMAL LOW (ref 12.0–15.0)
MCH: 27.2 pg (ref 26.0–34.0)
MCH: 27.3 pg (ref 26.0–34.0)
MCHC: 31.6 g/dL (ref 30.0–36.0)
MCHC: 31.8 g/dL (ref 30.0–36.0)
MCV: 86.2 fL (ref 78.0–100.0)
MCV: 85.9 fL (ref 78.0–100.0)
Platelets: 171 10*3/uL (ref 150–400)
Platelets: 169 10*3/uL (ref 150–400)
RBC: 2.9 MIL/uL — AB (ref 3.87–5.11)
RBC: 3.11 MIL/uL — ABNORMAL LOW (ref 3.87–5.11)
RDW: 17.4 % — ABNORMAL HIGH (ref 11.5–15.5)
RDW: 18 % — AB (ref 11.5–15.5)
WBC: 10 10*3/uL (ref 4.0–10.5)
WBC: 12 10*3/uL — ABNORMAL HIGH (ref 4.0–10.5)

## 2016-03-23 LAB — RENAL FUNCTION PANEL
ANION GAP: 8 (ref 5–15)
Albumin: 2.5 g/dL — ABNORMAL LOW (ref 3.5–5.0)
Albumin: 2.6 g/dL — ABNORMAL LOW (ref 3.5–5.0)
Anion gap: 8 (ref 5–15)
BUN: 50 mg/dL — AB (ref 6–20)
BUN: 39 mg/dL — ABNORMAL HIGH (ref 6–20)
CALCIUM: 8.1 mg/dL — AB (ref 8.9–10.3)
CHLORIDE: 103 mmol/L (ref 101–111)
CO2: 28 mmol/L (ref 22–32)
CO2: 27 mmol/L (ref 22–32)
CREATININE: 3.17 mg/dL — AB (ref 0.44–1.00)
Calcium: 7.8 mg/dL — ABNORMAL LOW (ref 8.9–10.3)
Chloride: 103 mmol/L (ref 101–111)
Creatinine, Ser: 3.78 mg/dL — ABNORMAL HIGH (ref 0.44–1.00)
GFR calc Af Amer: 13 mL/min — ABNORMAL LOW (ref >60)
GFR calc non Af Amer: 12 mL/min — ABNORMAL LOW (ref >60)
GFR, EST AFRICAN AMERICAN: 17 mL/min — AB (ref >60)
GFR, EST NON AFRICAN AMERICAN: 14 mL/min — AB (ref >60)
GLUCOSE: 179 mg/dL — AB (ref 65–99)
Glucose, Bld: 178 mg/dL — ABNORMAL HIGH (ref 65–99)
POTASSIUM: 3.6 mmol/L (ref 3.5–5.1)
Phosphorus: 2.9 mg/dL (ref 2.5–4.6)
Phosphorus: 2.5 mg/dL (ref 2.5–4.6)
Potassium: 5.8 mmol/L — ABNORMAL HIGH (ref 3.5–5.1)
SODIUM: 138 mmol/L (ref 135–145)
Sodium: 139 mmol/L (ref 135–145)

## 2016-03-23 LAB — BASIC METABOLIC PANEL
ANION GAP: 9 (ref 5–15)
BUN: 49 mg/dL — ABNORMAL HIGH (ref 6–20)
CALCIUM: 7.9 mg/dL — AB (ref 8.9–10.3)
CO2: 28 mmol/L (ref 22–32)
Chloride: 102 mmol/L (ref 101–111)
Creatinine, Ser: 3.95 mg/dL — ABNORMAL HIGH (ref 0.44–1.00)
GFR, EST AFRICAN AMERICAN: 13 mL/min — AB (ref >60)
GFR, EST NON AFRICAN AMERICAN: 11 mL/min — AB (ref >60)
Glucose, Bld: 182 mg/dL — ABNORMAL HIGH (ref 65–99)
POTASSIUM: 3.6 mmol/L (ref 3.5–5.1)
Sodium: 139 mmol/L (ref 135–145)

## 2016-03-23 LAB — APTT: aPTT: 83 seconds — ABNORMAL HIGH (ref 24–37)

## 2016-03-23 LAB — PHOSPHORUS: PHOSPHORUS: 2.9 mg/dL (ref 2.5–4.6)

## 2016-03-23 LAB — LACTATE DEHYDROGENASE: LDH: 203 U/L — AB (ref 98–192)

## 2016-03-23 LAB — CARBOXYHEMOGLOBIN
CARBOXYHEMOGLOBIN: 1.6 % — AB (ref 0.5–1.5)
Methemoglobin: 0.6 % (ref 0.0–1.5)
O2 Saturation: 70 %
Total hemoglobin: 8.9 g/dL — ABNORMAL LOW (ref 12.0–16.0)

## 2016-03-23 LAB — PROTIME-INR
INR: 3.02 — AB (ref 0.00–1.49)
PROTHROMBIN TIME: 30.7 s — AB (ref 11.6–15.2)

## 2016-03-23 LAB — CORTISOL: Cortisol, Plasma: 18.2 ug/dL

## 2016-03-23 LAB — LACTIC ACID, PLASMA: LACTIC ACID, VENOUS: 1 mmol/L (ref 0.5–2.0)

## 2016-03-23 LAB — MAGNESIUM: Magnesium: 2.2 mg/dL (ref 1.7–2.4)

## 2016-03-23 MED ORDER — PHYTONADIONE NICU INJECTION 1 MG/0.5 ML
2.0000 mg | Freq: Once | INTRAVENOUS | Status: AC
  Administered 2016-03-23: 2 mg via INTRAVENOUS
  Filled 2016-03-23: qty 1
  Filled 2016-03-23: qty 0.2

## 2016-03-23 MED ORDER — GLUCERNA SHAKE PO LIQD
237.0000 mL | Freq: Two times a day (BID) | ORAL | Status: DC
  Administered 2016-03-23 – 2016-03-26 (×5): 237 mL via ORAL

## 2016-03-23 MED ORDER — ADULT MULTIVITAMIN W/MINERALS CH
1.0000 | ORAL_TABLET | Freq: Every day | ORAL | Status: DC
  Administered 2016-03-23 – 2016-03-27 (×5): 1 via ORAL
  Filled 2016-03-23 (×6): qty 1

## 2016-03-23 MED ORDER — VANCOMYCIN 50 MG/ML ORAL SOLUTION
250.0000 mg | Freq: Four times a day (QID) | ORAL | Status: AC
Start: 2016-03-23 — End: 2016-04-03
  Administered 2016-03-23 – 2016-04-02 (×38): 250 mg via ORAL
  Filled 2016-03-23 (×49): qty 5

## 2016-03-23 MED ORDER — NOREPINEPHRINE BITARTRATE 1 MG/ML IV SOLN
2.0000 ug/min | INTRAVENOUS | Status: DC
  Administered 2016-03-23: 3 ug/min via INTRAVENOUS
  Filled 2016-03-23 (×3): qty 4

## 2016-03-23 MED ORDER — HYDROCORTISONE NA SUCCINATE PF 100 MG IJ SOLR: 50.0000 mg | Freq: Four times a day (QID) | INTRAMUSCULAR | Status: DC

## 2016-03-23 MED ORDER — FAMOTIDINE IN NACL 20-0.9 MG/50ML-% IV SOLN
20.0000 mg | Freq: Two times a day (BID) | INTRAVENOUS | Status: DC
  Administered 2016-03-23 (×2): 20 mg via INTRAVENOUS
  Filled 2016-03-23 (×2): qty 50

## 2016-03-23 NOTE — Progress Notes (Signed)
Advanced Heart Failure Rounding Note  Referring Physician: Dr. Glade Lloyd PCP: Not in system Primary Cardiologist: Previously in [place]  Subjective:    Poor urine output. Breathing stable.  Remains markedly volume overloaded.   INR markedly elevated at 6.38 on admit. Down to 4.12 after 4 units of FFP.  INR 3.02 this am.   Started on CVVH 03/22/16. Creatinine 4.46 -> 3.78. Coox 70%.  CVP 20.  MAP > 60 currently off pressors, able to pull up to 200 cc/hr by CVVH.  She is out 1.5 L so far today and down 2 lbs from yesterday.   Bloody diarrhea frequently yesterday, decreased overnight.   Feeling better. Breathing better.  Denies dizziness or lightheadedness.   Objective:   Weight Range: 286 lb 13.1 oz (130.1 kg) Body mass index is 49.21 kg/(m^2).   Vital Signs:   Temp:  [97.6 F (36.4 C)-99.4 F (37.4 C)] 97.7 F (36.5 C) (04/27 0400) Pulse Rate:  [59-77] 59 (04/27 0700) Resp:  [0-28] 22 (04/27 0700) BP: (77-109)/(47-80) 98/62 mmHg (04/27 0700) SpO2:  [97 %-100 %] 100 % (04/27 0700) Weight:  [286 lb 13.1 oz (130.1 kg)] 286 lb 13.1 oz (130.1 kg) (04/27 0445) Last BM Date: 03/22/16 (Flexi-Seal in place)  Weight change: Filed Weights   03/21/16 1315 03/22/16 0600 03/23/16 0445  Weight: 282 lb 4.8 oz (128.05 kg) 288 lb 2.3 oz (130.7 kg) 286 lb 13.1 oz (130.1 kg)    Intake/Output:   Intake/Output Summary (Last 24 hours) at 03/23/16 0716 Last data filed at 03/23/16 0700  Gross per 24 hour  Intake 988.67 ml  Output   2048 ml  Net -1059.33 ml     Physical Exam: General: Appears older than stated age, NAD. on CVVH HEENT: normal Neck: supple. JVP 16 cm +. Carotids 2+ bilat; no bruits. No thyromegaly or nodule noted Cor: PMI nondisplaced. RRR. + TR/MR murmur Lungs: Scattered crackles. Abdomen: Obese, tight, umbilical hernia, non-tender, mild/mod distention, no HSM. +BS  Extremities: no cyanosis, clubbing, rash. 3-4+ edema up to flanks.  Neuro: Drowsy but  orientedx3, cranial nerves grossly intact. moves all 4 extremities w/o difficulty. Affect pleasant  Telemetry: NSR 70s, with pacing spikes unrelated to qrs complexes  Labs: CBC  Recent Labs  03/20/16 1227 03/22/16 0520 03/23/16 0413  WBC 7.5 8.2 10.0  NEUTROABS 5.7  --   --   HGB 9.0* 8.3* 7.9*  HCT 28.4* 26.0* 25.0*  MCV 85.5 85.2 86.2  PLT 165 181 171   Basic Metabolic Panel  Recent Labs  03/22/16 0520 03/23/16 0413  NA 139 139  139  K 3.2* 3.6  3.6  CL 101 102  103  CO2 GLUCOSE 162* 182*  179*  BUN 68* 49*  50*  CREATININE 4.46* 3.95*  3.78*  CALCIUM 8.2* 7.9*  7.8*  MG 2.1 2.2  PHOS  --  2.9  2.9   Liver Function Tests  Recent Labs  03/21/16 0807 03/23/16 0413  AST 22  --   ALT 11*  --   ALKPHOS 110  --   BILITOT 1.5*  --   PROT 5.7*  --   ALBUMIN 2.1* 2.5*   No results for input(s): LIPASE, AMYLASE in the last 72 hours. Cardiac Enzymes No results for input(s): CKTOTAL, CKMB, CKMBINDEX, TROPONINI in the last 72 hours.  BNP: BNP (last 3 results)  Recent Labs  03/14/16 1457 03/20/16 1227  BNP 1509.6* 1489.8*    ProBNP (last 3 results)  No results for input(s): PROBNP in the last 8760 hours.   D-Dimer No results for input(s): DDIMER in the last 72 hours. Hemoglobin A1C No results for input(s): HGBA1C in the last 72 hours. Fasting Lipid Panel No results for input(s): CHOL, HDL, LDLCALC, TRIG, CHOLHDL, LDLDIRECT in the last 72 hours. Thyroid Function Tests  Recent Labs  03/22/16 0510  TSH 1.993    Other results:     Imaging/Studies:  US Renal  03/22/2016  CLINICAL DATA:  Acute kidney insufficiency, chronic kidney disease EXAM: RENAL / URINARY TRACT ULTRASOUND COMPLETE COMPARISON:  None. FINDINGS: Right Kidney: Length: 13.2 cm. Mild increased cortical echogenicity suspicious for medical renal disease. No hydronephrosis or renal mass. No renal calculi. Left Kidney: Length: 14 cm in length. Subtle mild increased  echogenicity No mass or hydronephrosis visualized. Bladder: Not visualized. Limited visualization of the left kidney due to bowel gas. IMPRESSION: 1. No hydronephrosis. No renal mass. Bilateral renal mild increased echogenicity suspicious for medical renal disease. Limited visualization of the left kidney due to bowel gas. Electronically Signed   By: Natasha Mead M.D.   On: 03/22/2016 10:15   Dg Chest Port 1 View  03/23/2016  CLINICAL DATA:  Respiratory failure. EXAM: PORTABLE CHEST 1 VIEW COMPARISON:  03/21/2016 and 03/20/2016. FINDINGS: 0557 hours. Patient is mildly rotated to the right. The left IJ central venous catheter is unchanged at the mid SVC level. The left subclavian AICD appears unchanged. There is stable cardiac enlargement. The lungs appear unchanged with persistent vascular congestion, but no overt pulmonary edema. There is no pleural effusion or pneumothorax. IMPRESSION: Stable chest with persistent cardiomegaly and vascular congestion. Electronically Signed   By: Carey Bullocks M.D.   On: 03/23/2016 07:07   Dg Chest Port 1 View  03/21/2016  CLINICAL DATA:  Patient status post central line placement. EXAM: PORTABLE CHEST 1 VIEW COMPARISON:  Chest radiograph 03/20/2016. FINDINGS: Single lead AICD device overlies the left hemi thorax, lead is stable in position. Interval insertion central venous catheter with tip projecting over the superior vena cava. Stable marked cardiomegaly. Pulmonary vascular redistribution. No large area of pulmonary consolidation. No pleural effusion or pneumothorax. IMPRESSION: Center venous catheter tip projects over the superior vena cava. Marked cardiomegaly. Electronically Signed   By: Annia Belt M.D.   On: 03/21/2016 20:48    Latest Echo  Latest Cath   Medications:     Scheduled Medications: . antiseptic oral rinse  7 mL Mouth Rinse BID  . atorvastatin  10 mg Oral Daily  . budesonide (PULMICORT) nebulizer solution  0.25 mg Nebulization BID  . heparin   4,000 Units Intravenous Once  . insulin aspart  0-9 Units Subcutaneous TID WC  . sodium chloride flush  10-40 mL Intracatheter Q12H  . sodium chloride flush  3 mL Intravenous Q12H  . vancomycin  125 mg Oral QID    Infusions: . heparin 10,000 units/ 20 mL infusion syringe 950 Units/hr (03/23/16 0700)  . dialysis replacement fluid (prismasate) 500 mL/hr at 03/23/16 0448  . dialysis replacement fluid (prismasate) 300 mL/hr at 03/22/16 1750  . dialysate (PRISMASATE) 1,500 mL/hr at 03/23/16 0416    PRN Medications: acetaminophen **OR** acetaminophen, albuterol, heparin, heparin, heparin, HYDROcodone-acetaminophen, LORazepam, ondansetron **OR** ondansetron (ZOFRAN) IV, sodium chloride flush   Assessment/Plan   Brandy Dudley is a 65 y.o. admitted from [facility] to IM service per notes for further evaluation for C. Diff colitis,  Acute CHF exacerbation, and ARF on CKD stage III. HF team consulted to  assist with fluid management.   1. Acute on chronic systolic CHF: Ischemic cardiomyopathy with history of prior CAD (details not available). She has an ICD, thinks Environmental manager. Echo 03/21/16 35-40% with Grade 2 DD, mod dilated RV, mild LAE, mod RAE, Severe TR, Peak PA 38 mm Hg. She remains markedly volume overload with JVD and anasarca. This is complicated by C difficile colitis and and AKI on CKD with creatinine up to 4.46 this admission.  Weight up at least 30 lbs by records. Now on CVVH.  Down 2 lbs overnight.  BP soft but so far tolerating CVVH without pressor need. Coox 70% this am, CVP 20.   - Hold coreg for now with soft BP on CVVH.  - Continue fluid removal via CVVH. 2. AKI on CKD: Creatinine was 1.49 on 4/8 but has steadily increased.  - Now on CVVH.  - She has C difficile diarrhea but as noted, does not appear dehydrated.  - Coox stable, no role for inotropes at this time.  3. R IJ DVT in 3/17: INR on admit 6.38. INR 3.02 today.  Having bloody diarrhea from C difficile colitis.   Continue to hold coumadin.  4. CAD: History not available. On ASA + Plavix in the past. This was held due to hematuria at last admission (foley trauma) and need to use warfarin. Continue atorvastatin.  - Coumadin on hold with elevated INR as above.  5. C difficile colitis: She was on clindamycin recently for MRSA breast abscess, suspect this led to predisposition. Per primary service. 6. Pacer spikes noted on telemetry, do not appear to capture.  - Sister at home has her ICD card. Will try and get a hold of her and have device interrogated.   Length of Stay: 3  Graciella Freer PA-C 03/23/2016, 7:16 AM  Advanced Heart Failure Team Pager [phone] (M-F; 7a - 4p)  Please contact [facility] for night-coverage after hours (4p -7a ) and weekends on [email or web address]  Patient seen with PA, agree with the above note.  Still markedly volume overloaded, did not respond to high dose diuretics and now on CVVH. Tolerating fluid removal up to 200 cc/hr with MAP > 60 currently.  Co-ox adequate at 70%.  Hold BP-active meds with soft BP and need for CVVH.   Bloody diarrhea ongoing, hgb to 7.9.  Per nursing, starting to slow.  C difficile colitis.  Transfuse hgb < 7. Holding warfarin with supratherapeutic INR.   Will plan to interrogate ICD today.   Marca Ancona 03/23/2016 7:42 AM

## 2016-03-23 NOTE — Progress Notes (Signed)
Subjective:   -1.9L on CVVHD. 60cc UOP.  Patient reports that abdomen is still as full as yesterday but that she is feeling a little better this am.  Objective Vital signs in last 24 hours: Filed Vitals:   03/23/16 0600 03/23/16 0700 03/23/16 0740 03/23/16 0800  BP: 84/54 98/62    Pulse: 60 59    Temp:    97.5 F (36.4 C)  TempSrc:    Oral  Resp: 18 22    Height:      Weight:      SpO2: 100% 100% 100%    Weight change: 4 lb 8.3 oz (2.05 kg)  Intake/Output Summary (Last 24 hours) at 03/23/16 0807 Last data filed at 03/23/16 0800  Gross per 24 hour  Intake    835 ml  Output   2171 ml  Net  -1336 ml   Labs: Basic Metabolic Panel:  Recent Labs Lab 03/21/16 0807 03/22/16 0520 03/23/16 0413  NA 139 139 139  139  K 3.8 3.2* 3.6  3.6  CL 105 101 102  103  CO2 GLUCOSE 81 162* 182*  179*  BUN 68* 68* 49*  50*  CREATININE 4.12* 4.46* 3.95*  3.78*  CALCIUM 8.0* 8.2* 7.9*  7.8*  PHOS  --   --  2.9  2.9   Liver Function Tests:  Recent Labs Lab 03/17/16 03/21/16 0807 03/23/16 0413  AST 11* 22  --   ALT 8 11*  --   ALKPHOS 130* 110  --   BILITOT  --  1.5*  --   PROT  --  5.7*  --   ALBUMIN  --  2.1* 2.5*   No results for input(s): LIPASE, AMYLASE in the last 168 hours. No results for input(s): AMMONIA in the last 168 hours. CBC:  Recent Labs Lab 03/20/16 1227 03/22/16 0520 03/23/16 0413  WBC 7.5 8.2 10.0  NEUTROABS 5.7  --   --   HGB 9.0* 8.3* 7.9*  HCT 28.4* 26.0* 25.0*  MCV 85.5 85.2 86.2  PLT 165 181 171   Cardiac Enzymes: No results for input(s): CKTOTAL, CKMB, CKMBINDEX, TROPONINI in the last 168 hours. CBG:  Recent Labs Lab 03/21/16 1603 03/21/16 1959 03/22/16 0810 03/22/16 1152 03/22/16 2126  GLUCAP 136* 146* 157* 197* 166*    Iron Studies: No results for input(s): IRON, TIBC, TRANSFERRIN, FERRITIN in the last 72 hours. Studies/Results: US Renal  03/22/2016  CLINICAL DATA:  Acute kidney insufficiency, chronic  kidney disease EXAM: RENAL / URINARY TRACT ULTRASOUND COMPLETE COMPARISON:  None. FINDINGS: Right Kidney: Length: 13.2 cm. Mild increased cortical echogenicity suspicious for medical renal disease. No hydronephrosis or renal mass. No renal calculi. Left Kidney: Length: 14 cm in length. Subtle mild increased echogenicity No mass or hydronephrosis visualized. Bladder: Not visualized. Limited visualization of the left kidney due to bowel gas. IMPRESSION: 1. No hydronephrosis. No renal mass. Bilateral renal mild increased echogenicity suspicious for medical renal disease. Limited visualization of the left kidney due to bowel gas. Electronically Signed   By: Natasha Mead M.D.   On: 03/22/2016 10:15   Dg Chest Port 1 View  03/23/2016  CLINICAL DATA:  Respiratory failure. EXAM: PORTABLE CHEST 1 VIEW COMPARISON:  03/21/2016 and 03/20/2016. FINDINGS: 0557 hours. Patient is mildly rotated to the right. The left IJ central venous catheter is unchanged at the mid SVC level. The left subclavian AICD appears unchanged. There is stable cardiac enlargement. The lungs appear unchanged with persistent  vascular congestion, but no overt pulmonary edema. There is no pleural effusion or pneumothorax. IMPRESSION: Stable chest with persistent cardiomegaly and vascular congestion. Electronically Signed   By: Carey Bullocks M.D.   On: 03/23/2016 07:07   Dg Chest Port 1 View  03/21/2016  CLINICAL DATA:  Patient status post central line placement. EXAM: PORTABLE CHEST 1 VIEW COMPARISON:  Chest radiograph 03/20/2016. FINDINGS: Single lead AICD device overlies the left hemi thorax, lead is stable in position. Interval insertion central venous catheter with tip projecting over the superior vena cava. Stable marked cardiomegaly. Pulmonary vascular redistribution. No large area of pulmonary consolidation. No pleural effusion or pneumothorax. IMPRESSION: Center venous catheter tip projects over the superior vena cava. Marked cardiomegaly.  Electronically Signed   By: Annia Belt M.D.   On: 03/21/2016 20:48   Medications: Infusions: . heparin 10,000 units/ 20 mL infusion syringe 950 Units/hr (03/23/16 0700)  . dialysis replacement fluid (prismasate) 500 mL/hr at 03/23/16 0448  . dialysis replacement fluid (prismasate) 300 mL/hr at 03/22/16 1750  . dialysate (PRISMASATE) 1,500 mL/hr at 03/23/16 0753    Scheduled Medications: . antiseptic oral rinse  7 mL Mouth Rinse BID  . atorvastatin  10 mg Oral Daily  . budesonide (PULMICORT) nebulizer solution  0.25 mg Nebulization BID  . heparin  4,000 Units Intravenous Once  . insulin aspart  0-9 Units Subcutaneous TID WC  . sodium chloride flush  10-40 mL Intracatheter Q12H  . sodium chloride flush  3 mL Intravenous Q12H  . vancomycin  125 mg Oral QID    have reviewed scheduled and prn medications.  Physical Exam: General: awake, NAD Heart: RRR Lungs: on [place], +expiratory wheeze, rales at bases Abdomen: NT, +distended, +BS, rectal tube with melena today. Extremities: 3+ edema to LE and sacrum Dialysis Access: right femoral triple lumen     Assessment/ Plan: Pt is a 65 y.o. yo female who was admitted on 03/20/2016 with C Diff colitis and Acute respiratory distress/ AoCHF   1. AKI/CKD in the setting of decompensated biventricular CHF: On CVVHD.  Still requiring O2.  Needs pressure support.  Limit fluids as able.  Likely will need more permanent access if requires ongoing HD.   2. Volume/ Pressure: Hypotensive.  60cc UOP/24h.  Only 1.9L off w/ CVVHD.  Still VERY volume overloaded.  Discussed with CCM, they will start pressors this am so that patient able to tolerate CVVHD. 3. Anemia: Hgb 7.9. Has h/o CAD/iCM/CHF.  Would recommend monitoring closely.  May need transfusion for hgb < 8.0.  Though also need to limit fluids as able. 4. H/o DVT - INR 3.02. Pharmacy following/ management per primary 5. GI bleed- Melena in rectal tube this morning.  Discontinue heparin w/ CVVHD. 6.  CAD/iCM/CHF- cards managing. 7. DM2: SSI per primary 8. C Diff colitis- on Vanc PO per primary.  Ashly Gottschalk, DO  03/23/2016,8:07 AM  LOS: 3 days     I have seen and examined this patient and agree with plan as outlined by Dr. Nadine Counts.  Unfortunately Ms. Brandy Dudley's cardiac status has deteriorated and has not been able to tolerate much UF.  I have asked Cardiology and PCCM to assist with pressure support as we attempt to remove volume with CVVHD.  She may also benefit from a blood transfusion since her Hgb has trended downward.  Will add aranesp as well.  Will stop heparin with CVVHD due to melena. Jomarie Longs A Matie Dimaano,MD 03/23/2016 11:32 AM

## 2016-03-23 NOTE — Progress Notes (Signed)
Initial Nutrition Assessment  DOCUMENTATION CODES:   Morbid obesity  INTERVENTION:    Glucerna Shake po BID, each supplement provides 220 kcal and 10 grams of protein  Multivitamin daily  NUTRITION DIAGNOSIS:   Increased nutrient needs related to acute illness as evidenced by estimated needs.  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 Diff colitis and acute respiratory distress/AoCHF. Transferred to the ICU on 4/26 for CRRT.  Patient reports that she usually weighs ~199-200 lbs. She has been eating okay at home, but also drinks Glucerna Shakes twice per week. Her physician only allows her to drink Glucerna twice per week because she is on Coumadin. Explained that she currently has increased nutrient needs and she would benefit from additional supplementation. She agreed to drink Glucerna Shakes BID. She is consuming 50-100% of meals.  Nutrition-Focused physical exam completed. Findings are no fat depletion and no muscle depletion. Unable to assess fluid status, but from review of flow sheets, she has severe edema. Currently receiving CRRT.   Diet Order:  Diet Heart Room service appropriate?: Yes; Fluid consistency:: Thin  Skin:  Reviewed, no issues  Last BM:  4/27 (flexiseal)  Height:   Ht Readings from Last 1 Encounters:  03/20/16 5\' 4"  (1.626 m)    Weight:   Wt Readings from Last 1 Encounters:  03/23/16 286 lb 13.1 oz (130.1 kg)    Ideal Body Weight:  54.5 kg  BMI:  Body mass index is 49.21 kg/(m^2).  Estimated Nutritional Needs:   Kcal:  2100-2300  Protein:  >/= 130 gm  Fluid:  2 L  EDUCATION NEEDS:   No  education needs identified at this time  Joaquin Courts, RD, LDN, CNSC Pager [phone] After Hours Pager [phone]

## 2016-03-23 NOTE — Progress Notes (Signed)
PULMONARY / CRITICAL CARE MEDICINE   Name: Brandy Dudley MRN: [id number] DOB: Apr 22, 1951    ADMISSION DATE:  03/20/2016 CONSULTATION DATE:  03/20/2016  REFERRING MD:  Dr. 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 Diff colitis and acute respiratory distress/AoCHF. She was brought from [facility]. She was recently hospitalized from 3/15 to 4/05 in [place], requiring HD at that time. She has had minimal urine output since admission depite being on lasix gtt and metolazone. Co-ox has been adequate at 71% suggesting she is not in a low output state. Remains markedly volume overloaded. Progressive worsening renal function since admission up to Cr of 4.48 today. Nephrology was consulted today who recommends CVVHD initiation. PCCM called for HD cath placement and transfer to the ICU for further care.   SUBJECTIVE:  No complaints, feeling better> feels easier to breath.  Borderline BP Anal blood noted  VITAL SIGNS: BP 98/62 mmHg  Pulse 59  Temp(Src) 97.5 F (36.4 C) (Oral)  Resp 22  Ht 5\' 4"  (1.626 m)  Wt 286 lb 13.1 oz (130.1 kg)  BMI 49.21 kg/m2  SpO2 100%  HEMODYNAMICS: CVP:  [20 mmHg-28 mmHg] 25 mmHg  VENTILATOR SETTINGS:    INTAKE / OUTPUT: I/O last 3 completed shifts: In: 1454.3 [P.O.:150; I.V.:307.3; Blood:957; Other:40] Out: 2050 [Urine:110; HENID:7824; Stool:3]  PHYSICAL EXAMINATION: General:  Chronically ill appearing obese female, lying in bed, in NAD Neuro: AAOx4, no focal deficits HEENT:  EOMI, PERRLA, Cardiovascular:  Difficult to auscultate but RRR, with systolic murmur, elevated JVD Lungs: present bilaterally difficult to ascultate Abdomen: Obese, distended abdomen, minimally tender to palpation, 3+ edema at flanks, BS+ Skin: warm, dry, 3-4+ pitting  edema up to flanks   LABS:  BMET  Recent Labs Lab 03/21/16 0807 03/22/16 0520 03/23/16 0413  NA 139 139 139  139  K 3.8 3.2* 3.6  3.6  CL 105 101 102  103  CO2 22 26 28  28   BUN 68* 68* 49*  50*  CREATININE 4.12* 4.46* 3.95*  3.78*  GLUCOSE 81 162* 182*  179*    Electrolytes  Recent Labs Lab 03/21/16 0807 03/22/16 0520 03/23/16 0413  CALCIUM 8.0* 8.2* 7.9*  7.8*  MG  --  2.1 2.2  PHOS  --   --  2.9  2.9    CBC  Recent Labs Lab 03/20/16 1227 03/22/16 0520 03/23/16 0413  WBC 7.5 8.2 10.0  HGB 9.0* 8.3* 7.9*  HCT 28.4* 26.0* 25.0*  PLT 165 181 171    Coag's  Recent Labs Lab 03/22/16 0520 03/22/16 1541 03/23/16 0413  APTT  --  57* 83*  INR 4.12* 2.81* 3.02*    Sepsis Markers No results for input(s): LATICACIDVEN, PROCALCITON, O2SATVEN in the last 168 hours.  ABG No results for input(s): PHART, PCO2ART, PO2ART in the last 168 hours.  Liver Enzymes  Recent Labs Lab 03/17/16 03/21/16 0807 03/23/16 0413  AST 11* 22  --   ALT 8 11*  --   ALKPHOS 130* 110  --   BILITOT  --  1.5*  --   ALBUMIN  --  2.1* 2.5*    Cardiac Enzymes No results for input(s): TROPONINI, PROBNP in the last 168 hours.  Glucose  Recent Labs Lab 03/21/16 1123  03/21/16 1603 03/21/16 1959 03/22/16 0810 03/22/16 1152 03/22/16 2126  GLUCAP 109* 136* 146* 157* 197* 166*    Imaging US Renal  03/22/2016  CLINICAL DATA:  Acute kidney insufficiency, chronic kidney disease EXAM: RENAL / URINARY TRACT ULTRASOUND COMPLETE COMPARISON:  None. FINDINGS: Right Kidney: Length: 13.2 cm. Mild increased cortical echogenicity suspicious for medical renal disease. No hydronephrosis or renal mass. No renal calculi. Left Kidney: Length: 14 cm in length. Subtle mild increased echogenicity No mass or hydronephrosis visualized. Bladder: Not visualized. Limited visualization of the left kidney due to bowel gas. IMPRESSION: 1. No hydronephrosis. No renal mass. Bilateral renal mild  increased echogenicity suspicious for medical renal disease. Limited visualization of the left kidney due to bowel gas. Electronically Signed   By: Natasha Mead M.D.   On: 03/22/2016 10:15   Dg Chest Port 1 View  03/23/2016  CLINICAL DATA:  Respiratory failure. EXAM: PORTABLE CHEST 1 VIEW COMPARISON:  03/21/2016 and 03/20/2016. FINDINGS: 0557 hours. Patient is mildly rotated to the right. The left IJ central venous catheter is unchanged at the mid SVC level. The left subclavian AICD appears unchanged. There is stable cardiac enlargement. The lungs appear unchanged with persistent vascular congestion, but no overt pulmonary edema. There is no pleural effusion or pneumothorax. IMPRESSION: Stable chest with persistent cardiomegaly and vascular congestion. Electronically Signed   By: Carey Bullocks M.D.   On: 03/23/2016 07:07   STUDIES:  4/26 Renal US: No hydronephrosis, bilateral increased echogenicity 4/27 pCXR>> cardiomegaly and vascular congestion  CULTURES: 4/25 Urine Culture >> Multiple species  4/25 C. Diff >> Ag+ Toxin -  ANTIBIOTICS: 4/24 Vancomycin PO>>>  SIGNIFICANT EVENTS: 4/24 admitted for C. Diff colitis and acute respiratory distress/AoCHF 4/26 transferred to ICU for CVVHD due to AKI/CKD related to decompensated biventricular CHF with massive volume overload developing hypotension.   LINES/TUBES: 4/25 LIJ CVL >>> 4/26 R fem HD cath >>>   DISCUSSION: 65 year old female with CHF (EF 30-35%, grade 2 DD), failed diuresis.  Now with worsening oliguric renal failure / probable cardiorenal syndrome.  Seen by nephrology who requests transfer to ICU for initiation of CVVHD.   ASSESSMENT / PLAN:  CARDIOVASCULAR A:  Probable cardiorenal syndrome. Hx combined CHF (echo from 03/21/16 with EF 35-40%, grade 2DD), ICM, HTN, CAD, RIJ DVT (on warfarin). P:  Heart failure team following, appreciate the assistance. Volume removal via CVVH. Down 1L overnight CVP 25 Add levophed to maintain  MAP during cvvhd neg balance further May require vasopressor support> has not yet co-ox 70%  Continue outpatient atorvastatin. Hold outpatient warfarin, carvedilol, torsemide. Get cortisol  RENAL A:  AoCKD - now with worsening oliguric renal failure. Hypokalemia. P:  Nephrology following, K+ per dialysis Replace electrolytes as indicated. BMP trend daily  PULMONARY A: Acute hypoxic respiratory failure - due to CHF / volume overload. Hx COPD. PAH by echo - PAP 38 on echo from 03/21/16. P:  Continue supplemental O2 as needed to maintain SpO2 > 92%. Volume removal via CVVH, more aggressive needed, agree Continue BD's. CXR intermittently  GASTROINTESTINAL A:  Nutrition. cdiff Bloody anus noted - coagulapthy, cdiff P:  Heart healthy diet Cbc in pm  Pressors top start, add pepcid, in setting cdiff Increase oral vanc dosing Get KUB Lactic acid, ldh, ensure no ischemia risk Correct coags, see heme  HEMATOLOGIC A:  Supratherapeutic INR - on warfarin due to RIJ DVT, cdiff INR 3 Anemia - chronic. VTE Prophylaxis. P:  Hold warfarin for now. Monitor INR. Transfuse for  Hgb < 7. SCD's. Low dose vit K  CBC trend in pm  May need ffp  INFECTIOUS A:  C.difficile colitis. Decreased output per nursing,  P:  Abx as above (vanco) Increase dose kub  ENDOCRINE A:  DM. P:  SSI.  NEUROLOGIC A:  Anxiety Dementia  P:  Continue low dose lorazepam (home med)  Family updated: None at bedside  Interdisciplinary Family Meeting v Palliative Care Meeting: Due by: 03/28/16. Gust Rung, DO  [id number]  03/23/2016 8:43 AM  STAFF NOTE: Cindi Carbon, MD FACP have personally reviewed patient's available data, including medical history, events of note, physical examination and test results as part of my evaluation. I have discussed with resident/NP and other care providers such as pharmacist, RN and RRT. In addition, I personally evaluated  patient and elicited key findings of: awake, obese, edema, crackles bases, gross overload, requires neg balance and then must support MAP better, add levophed for support, then increase neg balance on cvvhd, get cortisol level, may ned empiric roids, add pepcid with pressors start, with cdiff avoid ppi, get kub with blood from below, from coags, colitis status, get ladh, lactic concern ischemia?, vit k 2 mg, repeat cpags in am , goal inr 2.5 but NO bleeding, cbc in pm , increase dose oral vanc The patient is critically ill with multiple organ systems failure and requires high complexity decision making for assessment and support, frequent evaluation and titration of therapies, application of advanced monitoring technologies and extensive interpretation of multiple databases.   Critical Care Time devoted to patient care services described in this note is 30 Minutes. This time reflects time of care of this signee: Rory Percy, MD FACP. This critical care time does not reflect procedure time, or teaching time or supervisory time of PA/NP/Med student/Med Resident etc but could involve care discussion time. Rest per NP/medical resident whose note is outlined above and that I agree with   Mcarthur Rossetti. Tyson Alias, MD, FACP Pgr: [phone] [facility] 03/23/2016 10:49 AM

## 2016-03-23 NOTE — Evaluation (Signed)
Physical Therapy Evaluation Patient Details Name: Brandy Dudley MRN: [id number] DOB: 10/05/1951 Today's Date: 03/23/2016   History of Present Illness  Pt is a 65 y/o F admitted on 03/20/16 for C diff and acute respiratory distress/CHF.  She was recently hospitalized from 3/15-4/05 in [place], requiring HD at that time and s/p ICD.  Pt's PMH includes CHF, COPD, DVT.    Clinical Impression  Pt admitted with above diagnosis. Pt currently with functional limitations due to the deficits listed below (see PT Problem List). Evaluation limited as pt on CVVHD w/ Rt femoral catheter; however, A/AROM exercises completed supine.  Pt's reports "I want to be able to walk" and says she is planning to return to [facility], if possible, at d/c.  Pt will benefit from skilled PT to increase their independence and safety with mobility to allow discharge to the venue listed below.      Follow Up Recommendations SNF;Supervision/Assistance - 24 hour    Equipment Recommendations  Other (comment) (TBD at next venue of care)    Recommendations for Other Services       Precautions / Restrictions Precautions Precautions: Fall Precaution Comments: Rt femoral HD catheter; CVVHD Restrictions Weight Bearing Restrictions: No      Mobility  Bed Mobility               General bed mobility comments: Deferred due to Rt femoral HD catheter  Transfers                 General transfer comment: Deferred due to Rt femoral HD catheter  Ambulation/Gait                Stairs            Wheelchair Mobility    Modified Rankin (Stroke Patients Only)       Balance                                             Pertinent Vitals/Pain Pain Assessment: No/denies pain    Home Living Family/patient expects to be discharged to:: Skilled nursing facility                 Additional Comments: From [employer]    Prior Function Level of Independence: Needs assistance    Gait / Transfers Assistance Needed: Says she last ambulated in March using a cane.  Would need assist to propel WC at [facility].  Was doing a stand pivot w/ RW to get to [facility].   ADL's / Homemaking Assistance Needed: Needed assist at [facility] w/ bathing and dressing  Comments: Was living at her friend's house in [place].  Was recently hospitalized in [place] and then to [facility] from there. Moved to [facility] in Nov 2014 because her sister had an aneurysm.       Hand Dominance   Dominant Hand: Right    Extremity/Trunk Assessment   Upper Extremity Assessment: Generalized weakness           Lower Extremity Assessment: Generalized weakness (unable to test Rt LE)         Communication   Communication: No difficulties  Cognition Arousal/Alertness: Awake/alert Behavior During Therapy: WFL for tasks assessed/performed Overall Cognitive Status: Impaired/Different from baseline Area of Impairment: Orientation Orientation Level: Disoriented to;Place             General Comments: thinks she  is in [place].    General Comments General comments (skin integrity, edema, etc.): HR remained in the 60's.  BP at start of session 94/57, BP after session 99/63.  SpO2 remained in high 90's-100% on 3L O2.    Exercises General Exercises - Upper Extremity Shoulder Flexion: AROM;Limitations;10 reps;Both;Supine Shoulder Flexion Limitations: Pt struggles w/ light manual resistance Digit Composite Flexion: AROM;Both;10 reps;Supine General Exercises - Lower Extremity Ankle Circles/Pumps: AROM;Left;Other (comment);Strengthening;10 reps;Supine (w/ manual resistance) Heel Slides: AAROM;Left;5 reps;Supine Hip ABduction/ADduction: AAROM;Left;10 reps;Supine Straight Leg Raises: AAROM;Left;5 reps;Supine Other Exercises Other Exercises: Bil shoulder ER/IR AROM x10      Assessment/Plan    PT Assessment Patient needs continued PT services  PT Diagnosis Generalized weakness   PT  Problem List Decreased strength;Decreased activity tolerance;Decreased balance;Decreased mobility;Decreased cognition;Decreased safety awareness;Decreased knowledge of precautions;Cardiopulmonary status limiting activity  PT Treatment Interventions DME instruction;Gait training;Functional mobility training;Therapeutic activities;Therapeutic exercise;Balance training;Cognitive remediation;Patient/family education   PT Goals (Current goals can be found in the Care Plan section) Acute Rehab PT Goals Patient Stated Goal: "I want to be able to walk" PT Goal Formulation: With patient Time For Goal Achievement: 04/06/16 Potential to Achieve Goals: Good    Frequency Min 2X/week   Barriers to discharge        Co-evaluation               End of Session Equipment Utilized During Treatment: Oxygen Activity Tolerance: Patient tolerated treatment well Patient left: in bed;with call [place]/phone within reach;with nursing/sitter in room Nurse Communication: Mobility status (pt in room during second half of session)         Time: 1145-1210 PT Time Calculation (min) (ACUTE ONLY): 25 min   Charges:   PT Evaluation $PT Eval High Complexity: 1 Procedure     PT G Codes:       Encarnacion Chu PT, DPT  Pager: [phone] Phone: [phone] 03/23/2016, 1:02 PM

## 2016-03-24 LAB — RENAL FUNCTION PANEL
ALBUMIN: 2.6 g/dL — AB (ref 3.5–5.0)
ANION GAP: 8 (ref 5–15)
Albumin: 2.6 g/dL — ABNORMAL LOW (ref 3.5–5.0)
Anion gap: 9 (ref 5–15)
BUN: 32 mg/dL — AB (ref 6–20)
BUN: 25 mg/dL — ABNORMAL HIGH (ref 6–20)
CALCIUM: 8 mg/dL — AB (ref 8.9–10.3)
CHLORIDE: 105 mmol/L (ref 101–111)
CHLORIDE: 105 mmol/L (ref 101–111)
CO2: 25 mmol/L (ref 22–32)
CO2: 28 mmol/L (ref 22–32)
CREATININE: 2.79 mg/dL — AB (ref 0.44–1.00)
CREATININE: 2.54 mg/dL — AB (ref 0.44–1.00)
Calcium: 8.4 mg/dL — ABNORMAL LOW (ref 8.9–10.3)
GFR, EST AFRICAN AMERICAN: 19 mL/min — AB (ref >60)
GFR, EST AFRICAN AMERICAN: 22 mL/min — AB (ref >60)
GFR, EST NON AFRICAN AMERICAN: 17 mL/min — AB (ref >60)
GFR, EST NON AFRICAN AMERICAN: 19 mL/min — AB (ref >60)
Glucose, Bld: 149 mg/dL — ABNORMAL HIGH (ref 65–99)
Glucose, Bld: 201 mg/dL — ABNORMAL HIGH (ref 65–99)
Phosphorus: 2.1 mg/dL — ABNORMAL LOW (ref 2.5–4.6)
Phosphorus: 2.1 mg/dL — ABNORMAL LOW (ref 2.5–4.6)
Potassium: 5.4 mmol/L — ABNORMAL HIGH (ref 3.5–5.1)
Potassium: 4.2 mmol/L (ref 3.5–5.1)
SODIUM: 139 mmol/L (ref 135–145)
Sodium: 141 mmol/L (ref 135–145)

## 2016-03-24 LAB — CBC
HEMATOCRIT: 27.5 % — AB (ref 36.0–46.0)
HEMOGLOBIN: 8.6 g/dL — AB (ref 12.0–15.0)
MCH: 27.2 pg (ref 26.0–34.0)
MCHC: 31.3 g/dL (ref 30.0–36.0)
MCV: 87 fL (ref 78.0–100.0)
Platelets: 162 10*3/uL (ref 150–400)
RBC: 3.16 MIL/uL — AB (ref 3.87–5.11)
RDW: 18 % — ABNORMAL HIGH (ref 11.5–15.5)
WBC: 13.7 10*3/uL — ABNORMAL HIGH (ref 4.0–10.5)

## 2016-03-24 LAB — GLUCOSE, CAPILLARY
GLUCOSE-CAPILLARY: 123 mg/dL — AB (ref 65–99)
GLUCOSE-CAPILLARY: 183 mg/dL — AB (ref 65–99)
Glucose-Capillary: 149 mg/dL — ABNORMAL HIGH (ref 65–99)
Glucose-Capillary: 226 mg/dL — ABNORMAL HIGH (ref 65–99)

## 2016-03-24 LAB — POCT ACTIVATED CLOTTING TIME: ACTIVATED CLOTTING TIME: 193 s

## 2016-03-24 LAB — PROTIME-INR
INR: 2.08 — AB (ref 0.00–1.49)
Prothrombin Time: 23.2 seconds — ABNORMAL HIGH (ref 11.6–15.2)

## 2016-03-24 LAB — MAGNESIUM: MAGNESIUM: 2.4 mg/dL (ref 1.7–2.4)

## 2016-03-24 LAB — APTT: aPTT: 47 seconds — ABNORMAL HIGH (ref 24–37)

## 2016-03-24 MED ORDER — WARFARIN - PHARMACIST DOSING INPATIENT
Freq: Every day | Status: DC
  Administered 2016-03-25: 18:00:00

## 2016-03-24 MED ORDER — FAMOTIDINE IN NACL 20-0.9 MG/50ML-% IV SOLN
20.0000 mg | INTRAVENOUS | Status: DC
  Administered 2016-03-24 – 2016-03-25 (×2): 20 mg via INTRAVENOUS
  Filled 2016-03-24 (×2): qty 50

## 2016-03-24 MED ORDER — ALBUTEROL SULFATE (2.5 MG/3ML) 0.083% IN NEBU: 2.5000 mg | INHALATION_SOLUTION | Freq: Four times a day (QID) | RESPIRATORY_TRACT | Status: DC | PRN

## 2016-03-24 MED ORDER — HYDROCORTISONE NA SUCCINATE PF 100 MG IJ SOLR
50.0000 mg | Freq: Four times a day (QID) | INTRAMUSCULAR | Status: DC
  Administered 2016-03-24 – 2016-03-26 (×8): 50 mg via INTRAVENOUS
  Filled 2016-03-24 (×8): qty 2

## 2016-03-24 MED ORDER — PRISMASOL BGK 0/2.5 32-2.5 MEQ/L IV SOLN
INTRAVENOUS | Status: DC
  Administered 2016-03-24 – 2016-03-25 (×4): via INTRAVENOUS_CENTRAL
  Filled 2016-03-24 (×4): qty 5000

## 2016-03-24 MED ORDER — PRISMASOL BGK 0/2.5 32-2.5 MEQ/L IV SOLN
INTRAVENOUS | Status: DC
  Administered 2016-03-24 – 2016-03-26 (×4): via INTRAVENOUS_CENTRAL
  Filled 2016-03-24 (×7): qty 5000

## 2016-03-24 MED ORDER — WARFARIN SODIUM 2.5 MG PO TABS
2.5000 mg | ORAL_TABLET | Freq: Once | ORAL | Status: AC
  Administered 2016-03-24: 2.5 mg via ORAL
  Filled 2016-03-24: qty 1

## 2016-03-24 NOTE — Progress Notes (Signed)
ANTICOAGULATION CONSULT NOTE - Initial Consult Pharmacy Consult for Coumadin Indication: DVT  Allergies  Allergen Reactions  . Entresto [Sacubitril-Valsartan] Other (See Comments)    On MAR    Patient Measurements: Height: 5\' 4"  (162.6 cm) Weight: 273 lb 13 oz (124.2 kg) IBW/kg (Calculated) : 54.7  Vital Signs: Temp: 97.7 F (36.5 C) (04/28 0400) Temp Source: Oral (04/28 0400) BP: 103/56 mmHg (04/28 0600) Pulse Rate: 63 (04/28 0600)  Labs:  Recent Labs  03/22/16 1541 03/23/16 0413 03/23/16 1600 03/23/16 1708 03/24/16 0415  HGB  --  7.9*  --  8.5* 8.6*  HCT  --  25.0*  --  26.7* 27.5*  PLT  --  171  --  169 162  APTT 57* 83*  --   --  47*  LABPROT 29.1* 30.7*  --   --  23.2*  INR 2.81* 3.02*  --   --  2.08*  CREATININE  --  3.95*  3.78* 3.17*  --  2.79*    Estimated Creatinine Clearance: 26.2 mL/min (by C-G formula based on Cr of 2.79).   Medical History: Past Medical History  Diagnosis Date  . Coronary artery disease   . CHF (congestive heart failure) (HCC)   . COPD (chronic obstructive pulmonary disease) (HCC)   . Hypertension     Medications:  Prescriptions prior to admission  Medication Sig Dispense Refill Last Dose  . acetaminophen (TYLENOL) 325 MG tablet Take 650 mg by mouth every 6 (six) hours as needed for mild pain.   Past Week at Unknown time  . albuterol (PROVENTIL HFA;VENTOLIN HFA) 108 (90 Base) MCG/ACT inhaler Inhale 2 puffs into the lungs every 6 (six) hours as needed for wheezing or shortness of breath.    rescue at rescue  . atorvastatin (LIPITOR) 10 MG tablet Take 10 mg by mouth daily.   03/19/2016 at Unknown time  . beclomethasone (QVAR) 80 MCG/ACT inhaler Inhale 2 puffs into the lungs 2 (two) times daily.   03/19/2016 at Unknown time  . carvedilol (COREG) 12.5 MG tablet Take 12.5 mg by mouth 2 (two) times daily.   03/19/2016 at 2107  . insulin glargine (LANTUS) 100 UNIT/ML injection Inject 18 Units into the skin at bedtime.   03/19/2016 at  Unknown time  . insulin lispro (HUMALOG) 100 UNIT/ML injection Inject 5 Units into the skin 3 (three) times daily with meals. For CBG > 180   03/19/2016 at Unknown time  . LORazepam (ATIVAN) 0.5 MG tablet Take 0.5 mg by mouth 2 (two) times daily. Stop date 03/20/16   03/19/2016 at unk  . omeprazole (PRILOSEC) 20 MG capsule Take 1 capsule (20 mg total) by mouth daily. 30 capsule 0 03/20/2016 at Unknown time  . torsemide (DEMADEX) 100 MG tablet Take 20-100 mg by mouth See admin instructions. Per MAR, pt given 100mg  in am,  20mg  in afternoon, and 80mg  in evening on 4/23   unk at unk  . warfarin (COUMADIN) 2 MG tablet Take 2 mg by mouth daily. Take along with 2.5 mg= 4.5 mg daily   03/19/2016 at 2107  . warfarin (COUMADIN) 2.5 MG tablet Take 2.5 mg by mouth daily. Take along with 2 mg= 4.5 mg daily   03/19/2016 at 2107  . Inulin (FIBER CHOICE) 1.5 g CHEW Chew 2 tablets by mouth daily as needed (for constipation).    unk at unk    Assessment: 65 y.o. female with admitted with CHF exacerbation/ARF/C diff colitis, recent R IJ DVT, INR now within goal  range so Coumadin to be resumed.  Vit K 2 mg IV given yesterday ~ noon  PTA Coumadin regimen 4.5 mg daily  Goal of Therapy:  INR 2-3 Monitor platelets by anticoagulation protocol: Yes   Plan:  Coumadin 2.5 mg today Daily INR  Eddie Candle 03/24/2016,7:04 AM

## 2016-03-24 NOTE — Progress Notes (Signed)
Patient ID: Brandy Dudley, female   DOB: [birth year], 65 y.o.   MRN: [id number]     Advanced Heart Failure Rounding Note  Referring Physician: Dr. Glade Lloyd PCP: Not in system Primary Cardiologist: Previously in [place]  Subjective:    Started on CVVH 03/22/16.  Started on low dose norepinephrine yesterday to support BP with CVVH, now getting up to 300 cc/hr off.  SBP 100s-110s on norepinephrine 5.  CVP remains > 20.  Weight is coming down.   Diarrhea frequency has significantly decreased, hemoglobin trending up.  INR 2.08.    Feeling better. Breathing better.  Denies dizziness or lightheadedness.   Objective:   Weight Range: 273 lb 13 oz (124.2 kg) Body mass index is 46.98 kg/(m^2).   Vital Signs:   Temp:  [97.5 F (36.4 C)-98.4 F (36.9 C)] 97.7 F (36.5 C) (04/28 0400) Pulse Rate:  [59-126] 63 (04/28 0600) Resp:  [11-24] 17 (04/28 0600) BP: (88-126)/(38-96) 103/56 mmHg (04/28 0600) SpO2:  [99 %-100 %] 99 % (04/28 0600) Weight:  [273 lb 13 oz (124.2 kg)] 273 lb 13 oz (124.2 kg) (04/28 0500) Last BM Date: 03/23/16 (Flexi-Seal in place)  Weight change: Filed Weights   03/22/16 0600 03/23/16 0445 03/24/16 0500  Weight: 288 lb 2.3 oz (130.7 kg) 286 lb 13.1 oz (130.1 kg) 273 lb 13 oz (124.2 kg)    Intake/Output:   Intake/Output Summary (Last 24 hours) at 03/24/16 0651 Last data filed at 03/24/16 0600  Gross per 24 hour  Intake 836.45 ml  Output   5861 ml  Net -5024.55 ml     Physical Exam: General: Appears older than stated age, NAD. on CVVH HEENT: normal Neck: supple. JVP 16+. Carotids 2+ bilat; no bruits. No thyromegaly or nodule noted Cor: PMI nondisplaced. RRR. + TR/MR murmur Lungs: Scattered crackles. Abdomen: Obese, tight, umbilical hernia, non-tender, mild/mod distention, no HSM. +BS  Extremities: no cyanosis, clubbing, rash. 3-4+ edema up to flanks.  Neuro: Alert/orientedx3, cranial nerves grossly intact. moves all 4 extremities w/o difficulty. Affect  pleasant  Telemetry: NSR 70s  Labs: CBC  Recent Labs  03/23/16 1708 03/24/16 0415  WBC 12.0* 13.7*  HGB 8.5* 8.6*  HCT 26.7* 27.5*  MCV 85.9 87.0  PLT 169 162   Basic Metabolic Panel  Recent Labs  03/23/16 0413 03/23/16 1600 03/24/16 0415  NA 139  139 138 139  K 3.6  3.6 5.8* 5.4*  CL 102  103 103 105  CO2 28  28 27 25   GLUCOSE 182*  179* 178* 149*  BUN 49*  50* 39* 32*  CREATININE 3.95*  3.78* 3.17* 2.79*  CALCIUM 7.9*  7.8* 8.1* 8.0*  MG 2.2  --  2.4  PHOS 2.9  2.9 2.5 2.1*   Liver Function Tests  Recent Labs  03/21/16 0807  03/23/16 1600 03/24/16 0415  AST 22  --   --   --   ALT 11*  --   --   --   ALKPHOS 110  --   --   --   BILITOT 1.5*  --   --   --   PROT 5.7*  --   --   --   ALBUMIN 2.1*  < > 2.6* 2.6*  < > = values in this interval not displayed. No results for input(s): LIPASE, AMYLASE in the last 72 hours. Cardiac Enzymes No results for input(s): CKTOTAL, CKMB, CKMBINDEX, TROPONINI in the last 72 hours.  BNP: BNP (last 3 results)  Recent Labs  03/14/16  1457 03/20/16 1227  BNP 1509.6* 1489.8*    ProBNP (last 3 results) No results for input(s): PROBNP in the last 8760 hours.   D-Dimer No results for input(s): DDIMER in the last 72 hours. Hemoglobin A1C No results for input(s): HGBA1C in the last 72 hours. Fasting Lipid Panel No results for input(s): CHOL, HDL, LDLCALC, TRIG, CHOLHDL, LDLDIRECT in the last 72 hours. Thyroid Function Tests  Recent Labs  03/22/16 0510  TSH 1.993    Other results:     Imaging/Studies:  US Renal  03/22/2016  CLINICAL DATA:  Acute kidney insufficiency, chronic kidney disease EXAM: RENAL / URINARY TRACT ULTRASOUND COMPLETE COMPARISON:  None. FINDINGS: Right Kidney: Length: 13.2 cm. Mild increased cortical echogenicity suspicious for medical renal disease. No hydronephrosis or renal mass. No renal calculi. Left Kidney: Length: 14 cm in length. Subtle mild increased echogenicity No mass  or hydronephrosis visualized. Bladder: Not visualized. Limited visualization of the left kidney due to bowel gas. IMPRESSION: 1. No hydronephrosis. No renal mass. Bilateral renal mild increased echogenicity suspicious for medical renal disease. Limited visualization of the left kidney due to bowel gas. Electronically Signed   By: Natasha Mead M.D.   On: 03/22/2016 10:15   Dg Chest Port 1 View  03/23/2016  CLINICAL DATA:  Respiratory failure. EXAM: PORTABLE CHEST 1 VIEW COMPARISON:  03/21/2016 and 03/20/2016. FINDINGS: 0557 hours. Patient is mildly rotated to the right. The left IJ central venous catheter is unchanged at the mid SVC level. The left subclavian AICD appears unchanged. There is stable cardiac enlargement. The lungs appear unchanged with persistent vascular congestion, but no overt pulmonary edema. There is no pleural effusion or pneumothorax. IMPRESSION: Stable chest with persistent cardiomegaly and vascular congestion. Electronically Signed   By: Carey Bullocks M.D.   On: 03/23/2016 07:07   Dg Abd Portable 1v  03/23/2016  CLINICAL DATA:  Bloody diarrhea, colitis, hemodialysis dependent. EXAM: PORTABLE ABDOMEN - 1 VIEW COMPARISON:  None available FINDINGS: Scattered air and stool throughout the bowel. No significant obstruction or dilatation. Mild gastric distention. Right femoral dialysis catheter noted. Degenerative changes of the lower lumbar spine. IMPRESSION: No significant obstruction pattern or ileus. Electronically Signed   By: Judie Petit.  Shick M.D.   On: 03/23/2016 11:30    Latest Echo  Latest Cath   Medications:     Scheduled Medications: . antiseptic oral rinse  7 mL Mouth Rinse BID  . atorvastatin  10 mg Oral Daily  . budesonide (PULMICORT) nebulizer solution  0.25 mg Nebulization BID  . famotidine (PEPCID) IV  20 mg Intravenous Q12H  . feeding supplement (GLUCERNA SHAKE)  237 mL Oral BID BM  . heparin  4,000 Units Intravenous Once  . insulin aspart  0-9 Units Subcutaneous  TID WC  . multivitamin with minerals  1 tablet Oral Daily  . sodium chloride flush  10-40 mL Intracatheter Q12H  . sodium chloride flush  3 mL Intravenous Q12H  . vancomycin  250 mg Oral QID    Infusions: . norepinephrine (LEVOPHED) Adult infusion 5 mcg/min (03/24/16 0330)  . dialysis replacement fluid (prismasate) 500 mL/hr at 03/24/16 0440  . dialysis replacement fluid (prismasate) 300 mL/hr at 03/24/16 0610  . dialysate (PRISMASATE) 1,500 mL/hr at 03/24/16 0435    PRN Medications: acetaminophen **OR** acetaminophen, albuterol, heparin, HYDROcodone-acetaminophen, LORazepam, ondansetron **OR** ondansetron (ZOFRAN) IV, sodium chloride flush   Assessment/Plan   Marvelene Stoneberg is a 65 y.o. admitted from [place] [facility] to IM service per notes for further evaluation for C.  Diff colitis,  Acute CHF exacerbation, and ARF on CKD stage III. HF team consulted to assist with fluid management.   1. Acute on chronic systolic CHF: Ischemic cardiomyopathy with history of prior CAD (details not available). She has an ICD, thinks Environmental manager. Echo 03/21/16 35-40% with Grade 2 DD, mod dilated RV, mild LAE, mod RAE, Severe TR, Peak PA 38 mm Hg. She remains markedly volume overload with JVD and anasarca. This is complicated by C difficile colitis and and AKI on CKD with creatinine up to 4.46 this admission.  Weight up at least 30 lbs by records. Now on CVVH, started norepinephrine at low dose to facilitate volume removal.  Good UF overnight, weight down considerably but CVP still > 20.    - Hold coreg for now with soft BP on CVVH.  - Continue fluid removal via CVVH with low dose norepinephrine support to facilitate. 2. AKI on CKD: Creatinine was 1.49 on 4/8 but has steadily increased.  - Now on CVVH.  - She presented with C difficile diarrhea but did not appear dehydrated.  3. R IJ DVT in 3/17: INR on admit 6.38. INR 2.08 today.  She was initially having blood mixed in her diarrhea, this has resolved  and hemoglobin is up.  Will restart coumadin, aim for INR 2-2.5.  4. CAD: History not available. On ASA + Plavix in the past. This was held due to hematuria at last admission (foley trauma) and need to use warfarin. Continue atorvastatin.   5. C difficile colitis: She was on clindamycin recently for MRSA breast abscess, suspect this led to predisposition.Now on po vancomycin.  Diarrhea slowing, no longer bloody.   6. Anemia: Had blood in stool, likely related to C difficile colitis and high INR.  This seems to have resolved and hemoglobin is higher.  Will restart warfarin, aiming for INR 2-2.5 (2.08 today).  She has recent right IJ DVT.   Length of Stay: 4  Marca Ancona  03/24/2016, 6:51 AM  Advanced Heart Failure Team Pager [phone] (M-F; 7a - 4p)  Please contact [facility] for night-coverage after hours (4p -7a ) and weekends on [email or web address]

## 2016-03-24 NOTE — Progress Notes (Signed)
Subjective:   -5.7L CVVHD, UOP 50cc.  Patient notes that she is feeling better this am.  She initially reports that she does not want to proceed with HD.  Upon reevaluation with my attending, she notes that she wants to do what she can to live.  Discussed palliative care consult for GOC.  She is amenable to this and understands that she may not be able to tolerate HD, as she is requiring pressors for CVVHD.  Objective Vital signs in last 24 hours: Filed Vitals:   03/24/16 0700 03/24/16 0800 03/24/16 0900 03/24/16 0949  BP: 114/66 104/63 113/66   Pulse: 70 65 66   Temp:  97.6 F (36.4 C)    TempSrc:  Oral    Resp: 20 13 21    Height:      Weight:      SpO2: 99% 100% 93% 92%   Weight change: -13 lb 0.1 oz (-5.9 kg)  Intake/Output Summary (Last 24 hours) at 03/24/16 1029 Last data filed at 03/24/16 0932  Gross per 24 hour  Intake 1012.85 ml  Output   5543 ml  Net -4530.15 ml   Labs: Basic Metabolic Panel:  Recent Labs Lab 03/23/16 0413 03/23/16 1600 03/24/16 0415  NA 139  139 138 139  K 3.6  3.6 5.8* 5.4*  CL 102  103 103 105  CO2 28  28 27 25   GLUCOSE 182*  179* 178* 149*  BUN 49*  50* 39* 32*  CREATININE 3.95*  3.78* 3.17* 2.79*  CALCIUM 7.9*  7.8* 8.1* 8.0*  PHOS 2.9  2.9 2.5 2.1*   Liver Function Tests:  Recent Labs Lab 03/21/16 0807 03/23/16 0413 03/23/16 1600 03/24/16 0415  AST 22  --   --   --   ALT 11*  --   --   --   ALKPHOS 110  --   --   --   BILITOT 1.5*  --   --   --   PROT 5.7*  --   --   --   ALBUMIN 2.1* 2.5* 2.6* 2.6*   No results for input(s): LIPASE, AMYLASE in the last 168 hours. No results for input(s): AMMONIA in the last 168 hours. CBC:  Recent Labs Lab 03/20/16 1227 03/22/16 0520 03/23/16 0413 03/23/16 1708 03/24/16 0415  WBC 7.5 8.2 10.0 12.0* 13.7*  NEUTROABS 5.7  --   --   --   --   HGB 9.0* 8.3* 7.9* 8.5* 8.6*  HCT 28.4* 26.0* 25.0* 26.7* 27.5*  MCV 85.5 85.2 86.2 85.9 87.0  PLT 165 181 171 169 162   Cardiac  Enzymes: No results for input(s): CKTOTAL, CKMB, CKMBINDEX, TROPONINI in the last 168 hours. CBG:  Recent Labs Lab 03/22/16 2126 03/23/16 0841 03/23/16 1203 03/23/16 1616 03/23/16 2130  GLUCAP 166* 127* 150* 158* 156*    Iron Studies: No results for input(s): IRON, TIBC, TRANSFERRIN, FERRITIN in the last 72 hours. Studies/Results: Dg Chest Port 1 View  03/23/2016  CLINICAL DATA:  Respiratory failure. EXAM: PORTABLE CHEST 1 VIEW COMPARISON:  03/21/2016 and 03/20/2016. FINDINGS: 0557 hours. Patient is mildly rotated to the right. The left IJ central venous catheter is unchanged at the mid SVC level. The left subclavian AICD appears unchanged. There is stable cardiac enlargement. The lungs appear unchanged with persistent vascular congestion, but no overt pulmonary edema. There is no pleural effusion or pneumothorax. IMPRESSION: Stable chest with persistent cardiomegaly and vascular congestion. Electronically Signed   By: Hilarie Fredrickson.D.  On: 03/23/2016 07:07   Dg Abd Portable 1v  03/23/2016  CLINICAL DATA:  Bloody diarrhea, colitis, hemodialysis dependent. EXAM: PORTABLE ABDOMEN - 1 VIEW COMPARISON:  None available FINDINGS: Scattered air and stool throughout the bowel. No significant obstruction or dilatation. Mild gastric distention. Right femoral dialysis catheter noted. Degenerative changes of the lower lumbar spine. IMPRESSION: No significant obstruction pattern or ileus. Electronically Signed   By: Judie Petit.  Shick M.D.   On: 03/23/2016 11:30   Medications: Infusions: . norepinephrine (LEVOPHED) Adult infusion 5 mcg/min (03/24/16 0330)  . dialysis replacement fluid (prismasate)    . dialysis replacement fluid (prismasate)    . dialysate (PRISMASATE) 1,500 mL/hr at 03/24/16 0802    Scheduled Medications: . antiseptic oral rinse  7 mL Mouth Rinse BID  . atorvastatin  10 mg Oral Daily  . budesonide (PULMICORT) nebulizer solution  0.25 mg Nebulization BID  . famotidine (PEPCID) IV   20 mg Intravenous Q24H  . feeding supplement (GLUCERNA SHAKE)  237 mL Oral BID BM  . heparin  4,000 Units Intravenous Once  . hydrocortisone sod succinate (SOLU-CORTEF) inj  50 mg Intravenous Q6H  . insulin aspart  0-9 Units Subcutaneous TID WC  . multivitamin with minerals  1 tablet Oral Daily  . sodium chloride flush  10-40 mL Intracatheter Q12H  . sodium chloride flush  3 mL Intravenous Q12H  . vancomycin  250 mg Oral QID  . warfarin  2.5 mg Oral ONCE-1800  . Warfarin - Pharmacist Dosing Inpatient   Does not apply q1800    have reviewed scheduled and prn medications.  Physical Exam: General: awake, NAD Heart: RRR Lungs: off [place] during this exam, rales at bases Abdomen: NT, +distended but softer, +BS Extremities: 3+ edema to LE and sacrum Dialysis Access: right femoral triple lumen     Assessment/ Plan: Pt is a 65 y.o. yo female who was admitted on 03/20/2016 with C Diff colitis and Acute respiratory distress/ AoCHF   1. AKI/CKD in the setting of decompensated biventricular CHF: On CVVHD.  Needs pressure support.  Limit fluids as able.  Does not seem to be a good outpatient HD candidate, given need for pressors on CCVHD.  Additionally, seems to go back and forth as to whether she would like to even pursue HD going forward.  Recommend palliative care consult. 2. Volume/ Pressure: Hypotensive, on pressors.  50 cc UOP/24h. 5.7L off w/ CVVHD.  Continues to be volume overloaded but this is improving some.   3. Anemia: Hgb 8.6.  4. H/o DVT - INR 2.08. Pharmacy following/ management per primary 5. GI bleed- no heparin with CVVHD 6. CAD/iCM/CHF- cards managing. 7. DM2: SSI per primary 8. C Diff colitis- on Vanc PO per primary. 9. Hyperkalemia- K 5.4 this am.  CVVHD fluids adjusted to normalize K.  Poor candidate for outpatient HD.  Would recommend palliative care consult.  Continue CVVHD for now, as pressures allow.   Ashly Gottschalk, DO  03/24/2016,10:29 AM  LOS: 4 days      I  have seen and examined this patient and agree with plan as outlined by Dr. Nadine Counts.  Initially Ms. Hellinger was discussing stopping everything but admitted later this morning that she sometimes gets depressed.  However, we do need palliative care consult to set some goals/limits of care as her prognosis is poor due to her multiple irreversible comorbidities and poor functional status.  Unclear is she will even be able to tolerate intermitted HD due to hypotension and biventricular CHF. Due  to persistently elevated K will change replacement fluids to 0K and follow. Jomarie Longs A Finbar Nippert,MD 03/24/2016 12:30 PM

## 2016-03-24 NOTE — Progress Notes (Signed)
PULMONARY / CRITICAL CARE MEDICINE   Name: Brandy Dudley MRN: [id number] DOB: 08-06-1951    ADMISSION DATE:  03/20/2016 CONSULTATION DATE:  03/20/2016  REFERRING MD:  Dr. Shirlee Latch  CHIEF COMPLAINT:  SOB  HISTORY OF PRESENT ILLNESS:   Ms. 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 Diff colitis and acute respiratory distress/AoCHF. She was brought from [facility]. She was recently hospitalized from 3/15 to 4/05 in [place] [facility], requiring HD at that time. She has had minimal urine output since admission depite being on lasix gtt and metolazone. Co-ox has been adequate at 71% suggesting she is not in a low output state. Remains markedly volume overloaded. Progressive worsening renal function since admission up to Cr of 4.48 today. Nephrology was consulted today who recommends CVVHD initiation. PCCM called for HD cath placement and transfer to the ICU for further care.   SUBJECTIVE:  Breathing better, now on levophed for BP support, CVVHD pulling fluid off,  Does report increased weakness from laying in bed  VITAL SIGNS: BP 114/66 mmHg  Pulse 70  Temp(Src) 97.7 F (36.5 C) (Oral)  Resp 20  Ht 5\' 4"  (1.626 m)  Wt 273 lb 13 oz (124.2 kg)  BMI 46.98 kg/m2  SpO2 99%  HEMODYNAMICS: CVP:  [23 mmHg-25 mmHg] 25 mmHg  VENTILATOR SETTINGS:    INTAKE / OUTPUT: I/O last 3 completed shifts: In: 895.3 [P.O.:350; I.V.:252.3; Other:133; IV Piggyback:160] Out: 7959 [Urine:110; KJZPH:1505; Stool:195]  PHYSICAL EXAMINATION: General:  Chronically ill appearing obese female, lying in bed, in NAD Neuro: AAOx4, no focal deficits HEENT: PERRLA, O2 via [place] Cardiovascular:   RRR, systolic murmur, elevated JVD Lungs: present bilaterally Abdomen: Obese, soft, nontender,  BS+ Skin: warm, dry, 3-4+ pitting edema up to flanks   LABS:  BMET  Recent Labs Lab  03/23/16 0413 03/23/16 1600 03/24/16 0415  NA 139  139 138 139  K 3.6  3.6 5.8* 5.4*  CL 102  103 103 105  CO2 28  28 27 25   BUN 49*  50* 39* 32*  CREATININE 3.95*  3.78* 3.17* 2.79*  GLUCOSE 182*  179* 178* 149*    Electrolytes  Recent Labs Lab 03/22/16 0520 03/23/16 0413 03/23/16 1600 03/24/16 0415  CALCIUM 8.2* 7.9*  7.8* 8.1* 8.0*  MG 2.1 2.2  --  2.4  PHOS  --  2.9  2.9 2.5 2.1*    CBC  Recent Labs Lab 03/23/16 0413 03/23/16 1708 03/24/16 0415  WBC 10.0 12.0* 13.7*  HGB 7.9* 8.5* 8.6*  HCT 25.0* 26.7* 27.5*  PLT 171 169 162    Coag's  Recent Labs Lab 03/22/16 1541 03/23/16 0413 03/24/16 0415  APTT 57* 83* 47*  INR 2.81* 3.02* 2.08*    Sepsis Markers  Recent Labs Lab 03/23/16 1200  LATICACIDVEN 1.0    ABG No results for input(s): PHART, PCO2ART, PO2ART in the last 168 hours.  Liver Enzymes  Recent Labs Lab 03/21/16 0807 03/23/16 0413 03/23/16 1600 03/24/16 0415  AST 22  --   --   --   ALT 11*  --   --   --   ALKPHOS 110  --   --   --   BILITOT 1.5*  --   --   --   ALBUMIN 2.1* 2.5* 2.6* 2.6*    Cardiac Enzymes No results for input(s):  TROPONINI, PROBNP in the last 168 hours.  Glucose  Recent Labs Lab 03/22/16 1152 03/22/16 2126 03/23/16 0841 03/23/16 1203 03/23/16 1616 03/23/16 2130  GLUCAP 197* 166* 127* 150* 158* 156*    Imaging Dg Abd Portable 1v  03/23/2016  CLINICAL DATA:  Bloody diarrhea, colitis, hemodialysis dependent. EXAM: PORTABLE ABDOMEN - 1 VIEW COMPARISON:  None available FINDINGS: Scattered air and stool throughout the bowel. No significant obstruction or dilatation. Mild gastric distention. Right femoral dialysis catheter noted. Degenerative changes of the lower lumbar spine. IMPRESSION: No significant obstruction pattern or ileus. Electronically Signed   By: Judie Petit.  Shick M.D.   On: 03/23/2016 11:30   STUDIES:  4/26 Renal US: No hydronephrosis, bilateral increased echogenicity 4/27 pCXR>>  cardiomegaly and vascular congestion  CULTURES: 4/25 Urine Culture >> Multiple species  4/25 C. Diff >> Ag+ Toxin -  ANTIBIOTICS: 4/24 Vancomycin PO>>>  SIGNIFICANT EVENTS: 4/24 admitted for C. Diff colitis and acute respiratory distress/AoCHF 4/26 transferred to ICU for CVVHD due to AKI/CKD related to decompensated biventricular CHF with massive volume overload developing hypotension.   LINES/TUBES: 4/25 LIJ CVL >>> 4/26 R fem HD cath >>>   DISCUSSION: 65 year old female with CHF (EF 30-35%, grade 2 DD), failed diuresis.  Now with worsening oliguric renal failure / probable cardiorenal syndrome.  Seen by nephrology who requests transfer to ICU for initiation of CVVHD.   ASSESSMENT / PLAN:  CARDIOVASCULAR A:  Probable cardiorenal syndrome. Hx combined CHF (echo from 03/21/16 with EF 35-40%, grade 2DD), ICM, HTN, CAD, RIJ DVT (on warfarin). P:  Heart failure team following, appreciate the assistance. Volume removal via CVVH. Down 5L in last 24 but CVP remains elevated Continue levophed to maintain MAP during cvvhd neg balance further Continue outpatient atorvastatin. Hold outpatient  carvedilol, torsemide. Restart warfarin with INR goal 2-2.5  RENAL A:  AoCKD - now with worsening oliguric renal failure. Hypokalemia>> resolved, mild Hyperkalemia P:  Nephrology following, K+ per dialysis Replace electrolytes as indicated. BMP trend daily Neg 5 liters, for same goal next 24 hrs  PULMONARY A: Acute hypoxic respiratory failure - due to CHF / volume overload. Hx COPD. PAH by echo - PAP 38 on echo from 03/21/16. P:  Continue supplemental O2 as needed to maintain SpO2 > 92%. Volume removal via CVVH Continue BD's. CXR intermittently  GASTROINTESTINAL A:  Nutrition. cdiff Bloody anus noted - coagulapthy, cdiff>> bleeding resolved P:  Heart healthy diet Cbc in pm  Continue pepcid  oral vanc higher dose Lactic acid normal do not suspect ischemic bowel at  this time  HEMATOLOGIC A:  Supratherapeutic INR - on warfarin due to RIJ DVT, cdiff>> resolved INR 2 Anemia - chronic. VTE Prophylaxis. P:  May restart warfarin with goal 2-2.5 Monitor INR. Transfuse for Hgb < 7. SCD's. CBC trend in pm  May need ffp if bleeding again  INFECTIOUS A:  C.difficile colitis.  P:  Abx as above (vanco)  ENDOCRINE A:  DM. Relative AI (cortisol 18) P:  SSI. Start stress steroids,, keep until off pressors  NEUROLOGIC A:  Anxiety Dementia  P:  Continue low dose lorazepam (home med)  Family updated: None at bedside  Interdisciplinary Family Meeting v Palliative Care Meeting: Due by: 03/28/16. Gust Rung, DO  [id number]  03/24/2016 7:20 AM   STAFF NOTE: Cindi Carbon, MD FACP have personally reviewed patient's available data, including medical history, events of note, physical examination and test results as part of my evaluation. I have discussed with resident/NP and  other care providers such as pharmacist, RN and RRT. In addition, I personally evaluated patient and elicited key findings of: obese female, under blanket warmer, no distress, 5 liters neg, edema remains, remains on levophed at 5, to map 65 with more aggressive needed neg balance, rel AI present, keep stress roids, less bleeding from anus, inr 2, ij clot important to re attempt coumadine again, may need to consider use heparin short acting, diet, LDH, lactic neg, no evidence ischemic bowel, maintain higher oral vanc of okay by id, cbc in am  The patient is critically ill with multiple organ systems failure and requires high complexity decision making for assessment and support, frequent evaluation and titration of therapies, application of advanced monitoring technologies and extensive interpretation of multiple databases.   Critical Care Time devoted to patient care services described in this note is 30 Minutes. This time reflects time of care of this  signee: Rory Percy, MD FACP. This critical care time does not reflect procedure time, or teaching time or supervisory time of PA/NP/Med student/Med Resident etc but could involve care discussion time. Rest per NP/medical resident whose note is outlined above and that I agree with   Mcarthur Rossetti. Tyson Alias, MD, FACP Pgr: [phone]  Pulmonary & Critical Care 03/24/2016 10:03 AM

## 2016-03-25 DIAGNOSIS — N185 Chronic kidney disease, stage 5: Secondary | ICD-10-CM

## 2016-03-25 LAB — CBC
HCT: 27 % — ABNORMAL LOW (ref 36.0–46.0)
Hemoglobin: 8.5 g/dL — ABNORMAL LOW (ref 12.0–15.0)
MCH: 28.1 pg (ref 26.0–34.0)
MCHC: 31.5 g/dL (ref 30.0–36.0)
MCV: 89.1 fL (ref 78.0–100.0)
PLATELETS: 147 10*3/uL — AB (ref 150–400)
RBC: 3.03 MIL/uL — AB (ref 3.87–5.11)
RDW: 18.1 % — AB (ref 11.5–15.5)
WBC: 15 10*3/uL — AB (ref 4.0–10.5)

## 2016-03-25 LAB — GLUCOSE, CAPILLARY
GLUCOSE-CAPILLARY: 243 mg/dL — AB (ref 65–99)
GLUCOSE-CAPILLARY: 275 mg/dL — AB (ref 65–99)
Glucose-Capillary: 256 mg/dL — ABNORMAL HIGH (ref 65–99)
Glucose-Capillary: 241 mg/dL — ABNORMAL HIGH (ref 65–99)

## 2016-03-25 LAB — RENAL FUNCTION PANEL
ALBUMIN: 2.7 g/dL — AB (ref 3.5–5.0)
ANION GAP: 10 (ref 5–15)
Albumin: 2.6 g/dL — ABNORMAL LOW (ref 3.5–5.0)
Anion gap: 6 (ref 5–15)
BUN: 20 mg/dL (ref 6–20)
BUN: 18 mg/dL (ref 6–20)
CALCIUM: 8.3 mg/dL — AB (ref 8.9–10.3)
CALCIUM: 8.3 mg/dL — AB (ref 8.9–10.3)
CHLORIDE: 103 mmol/L (ref 101–111)
CO2: 26 mmol/L (ref 22–32)
CO2: 27 mmol/L (ref 22–32)
CREATININE: 2.19 mg/dL — AB (ref 0.44–1.00)
Chloride: 104 mmol/L (ref 101–111)
Creatinine, Ser: 2.27 mg/dL — ABNORMAL HIGH (ref 0.44–1.00)
GFR calc non Af Amer: 21 mL/min — ABNORMAL LOW (ref >60)
GFR, EST AFRICAN AMERICAN: 25 mL/min — AB (ref >60)
GFR, EST AFRICAN AMERICAN: 26 mL/min — AB (ref >60)
GFR, EST NON AFRICAN AMERICAN: 22 mL/min — AB (ref >60)
GLUCOSE: 260 mg/dL — AB (ref 65–99)
Glucose, Bld: 285 mg/dL — ABNORMAL HIGH (ref 65–99)
PHOSPHORUS: 1.8 mg/dL — AB (ref 2.5–4.6)
POTASSIUM: 3.9 mmol/L (ref 3.5–5.1)
Phosphorus: 1.6 mg/dL — ABNORMAL LOW (ref 2.5–4.6)
Potassium: 3.6 mmol/L (ref 3.5–5.1)
SODIUM: 140 mmol/L (ref 135–145)
SODIUM: 136 mmol/L (ref 135–145)

## 2016-03-25 LAB — BASIC METABOLIC PANEL
Anion gap: 10 (ref 5–15)
BUN: 20 mg/dL (ref 6–20)
CALCIUM: 8.4 mg/dL — AB (ref 8.9–10.3)
CO2: 26 mmol/L (ref 22–32)
CREATININE: 2.27 mg/dL — AB (ref 0.44–1.00)
Chloride: 104 mmol/L (ref 101–111)
GFR, EST AFRICAN AMERICAN: 25 mL/min — AB (ref >60)
GFR, EST NON AFRICAN AMERICAN: 21 mL/min — AB (ref >60)
Glucose, Bld: 258 mg/dL — ABNORMAL HIGH (ref 65–99)
Potassium: 3.9 mmol/L (ref 3.5–5.1)
SODIUM: 140 mmol/L (ref 135–145)

## 2016-03-25 LAB — MAGNESIUM: MAGNESIUM: 2.4 mg/dL (ref 1.7–2.4)

## 2016-03-25 LAB — PROTIME-INR
INR: 2.06 — ABNORMAL HIGH (ref 0.00–1.49)
Prothrombin Time: 23.1 seconds — ABNORMAL HIGH (ref 11.6–15.2)

## 2016-03-25 LAB — APTT: APTT: 45 s — AB (ref 24–37)

## 2016-03-25 MED ORDER — INSULIN ASPART 100 UNIT/ML ~~LOC~~ SOLN
0.0000 [IU] | Freq: Every day | SUBCUTANEOUS | Status: DC
  Administered 2016-03-25: 2 [IU] via SUBCUTANEOUS
  Administered 2016-03-26 – 2016-03-29 (×3): 3 [IU] via SUBCUTANEOUS

## 2016-03-25 MED ORDER — FAMOTIDINE 20 MG PO TABS
20.0000 mg | ORAL_TABLET | Freq: Every day | ORAL | Status: DC
  Administered 2016-03-26 – 2016-04-10 (×16): 20 mg via ORAL
  Filled 2016-03-25 (×16): qty 1

## 2016-03-25 MED ORDER — INSULIN ASPART 100 UNIT/ML ~~LOC~~ SOLN
0.0000 [IU] | Freq: Three times a day (TID) | SUBCUTANEOUS | Status: DC
  Administered 2016-03-25 – 2016-03-26 (×4): 11 [IU] via SUBCUTANEOUS
  Administered 2016-03-26 – 2016-03-28 (×5): 4 [IU] via SUBCUTANEOUS
  Administered 2016-03-29 (×2): 7 [IU] via SUBCUTANEOUS
  Administered 2016-03-29: 4 [IU] via SUBCUTANEOUS
  Administered 2016-03-30: 3 [IU] via SUBCUTANEOUS
  Administered 2016-03-31: 4 [IU] via SUBCUTANEOUS
  Administered 2016-03-31: 3 [IU] via SUBCUTANEOUS
  Administered 2016-03-31 – 2016-04-01 (×2): 4 [IU] via SUBCUTANEOUS
  Administered 2016-04-01: 3 [IU] via SUBCUTANEOUS
  Administered 2016-04-02: 4 [IU] via SUBCUTANEOUS
  Administered 2016-04-02 – 2016-04-03 (×2): 3 [IU] via SUBCUTANEOUS
  Administered 2016-04-04: 4 [IU] via SUBCUTANEOUS
  Administered 2016-04-05 – 2016-04-08 (×8): 3 [IU] via SUBCUTANEOUS
  Administered 2016-04-09: 4 [IU] via SUBCUTANEOUS

## 2016-03-25 MED ORDER — DIPHENHYDRAMINE HCL 12.5 MG/5ML PO ELIX
12.5000 mg | ORAL_SOLUTION | Freq: Four times a day (QID) | ORAL | Status: DC | PRN
  Administered 2016-03-25 – 2016-04-10 (×7): 12.5 mg via ORAL
  Filled 2016-03-25 (×9): qty 5

## 2016-03-25 MED ORDER — INSULIN ASPART 100 UNIT/ML ~~LOC~~ SOLN: 0.0000 [IU] | Freq: Three times a day (TID) | SUBCUTANEOUS | Status: DC

## 2016-03-25 MED ORDER — WARFARIN SODIUM 2 MG PO TABS
3.0000 mg | ORAL_TABLET | Freq: Once | ORAL | Status: AC
  Administered 2016-03-25: 3 mg via ORAL
  Filled 2016-03-25: qty 1.5

## 2016-03-25 MED ORDER — WARFARIN SODIUM 2.5 MG PO TABS: 2.5000 mg | ORAL_TABLET | Freq: Once | ORAL | Status: DC

## 2016-03-25 NOTE — Consult Note (Addendum)
Referred by:  Dr. Arrie Aran (Nephrology)  Reason for referral: [facility] placement  History of Present Illness  Brandy Dudley is a 66 y.o. (1951-04-01) female who presents for evaluation for [facility] placemen and accesst.  The patient is right hand dominant.  The patient has not had previous access procedures.  Previous central venous cannulation procedures include: RIJ catheter that lead to a RIJV DVT, current LIJV CVC, and R femoral temporal dialysis catheter.  The patient has never had a PPM placed.  History is limited as the patient continues to fall asleep.  She has been on CVVHD with the assistance of pressors.  She has been weaned on her vasopressors and appears to be tolerating CVVHD today.   Past Medical History  Diagnosis Date  . Coronary artery disease   . CHF (congestive heart failure) (HCC)   . COPD (chronic obstructive pulmonary disease) (HCC)   . Hypertension     Past Surgical History: central lines as listed above  Social History   Social History  . Marital Status: Single    Spouse Name: N/A  . Number of Children: N/A  . Years of Education: N/A   Occupational History  . Not on file.   Social History Main Topics  . Smoking status: Former Smoker    Types: Cigarettes    Quit date: 03/03/2006  . Smokeless tobacco: Never Used  . Alcohol Use: No  . Drug Use: No  . Sexual Activity: No   Other Topics Concern  . Not on file   Social History Narrative    Family History: patient is unable to detail the medical history of his parents  Current Facility-Administered Medications  Medication Dose Route Frequency Provider Last Rate Last Dose  . acetaminophen (TYLENOL) tablet 650 mg  650 mg Oral Q6H PRN Marcos Eke, PA-C       Or  . acetaminophen (TYLENOL) suppository 650 mg  650 mg Rectal Q6H PRN Marcos Eke, PA-C      . albuterol (PROVENTIL) (2.5 MG/3ML) 0.083% nebulizer solution 2.5 mg  2.5 mg Nebulization Q6H PRN Nelda Bucks, MD      . antiseptic oral  rinse (CPC / CETYLPYRIDINIUM CHLORIDE 0.05%) solution 7 mL  7 mL Mouth Rinse BID Costin Otelia Sergeant, MD   7 mL at 03/25/16 1000  . atorvastatin (LIPITOR) tablet 10 mg  10 mg Oral Daily Marcos Eke, PA-C   10 mg at 03/25/16 0900  . budesonide (PULMICORT) nebulizer solution 0.25 mg  0.25 mg Nebulization BID Emi Holes, RPH   0.25 mg at 03/25/16 0800  . diphenhydrAMINE (BENADRYL) 12.5 MG/5ML elixir 12.5 mg  12.5 mg Oral Q6H PRN Praveen Mannam, MD   12.5 mg at 03/25/16 1400  . [START ON 03/26/2016] famotidine (PEPCID) tablet 20 mg  20 mg Oral Daily Sherron Monday, RPH      . feeding supplement (GLUCERNA SHAKE) (GLUCERNA SHAKE) liquid 237 mL  237 mL Oral BID BM Nelda Bucks, MD   237 mL at 03/24/16 1400  . heparin injection 4,000 Units  4,000 Units Intravenous Once Nelda Bucks, MD      . heparinized saline (2000 units/L) primer fluid for CRRT   CRRT PRN Terrial Rhodes, MD      . HYDROcodone-acetaminophen (NORCO/VICODIN) 5-325 MG per tablet 1-2 tablet  1-2 tablet Oral Q4H PRN Marcos Eke, PA-C      . hydrocortisone sodium succinate (SOLU-CORTEF) 100 MG injection 50 mg  50 mg Intravenous  Q6H Gust Rung, DO   50 mg at 03/25/16 1218  . insulin aspart (novoLOG) injection 0-20 Units  0-20 Units Subcutaneous TID WC Jeanella Craze, NP   11 Units at 03/25/16 1258  . insulin aspart (novoLOG) injection 0-5 Units  0-5 Units Subcutaneous QHS Jeanella Craze, NP      . LORazepam (ATIVAN) tablet 0.5 mg  0.5 mg Oral BID PRN Rolly Salter, MD   0.5 mg at 03/24/16 0200  . multivitamin with minerals tablet 1 tablet  1 tablet Oral Daily Nelda Bucks, MD   1 tablet at 03/25/16 0900  . norepinephrine (LEVOPHED) 4 mg in dextrose 5 % 250 mL (0.016 mg/mL) infusion  2-50 mcg/min Intravenous Continuous Nelda Bucks, MD   Stopped at 03/24/16 2308  . ondansetron (ZOFRAN) tablet 4 mg  4 mg Oral Q6H PRN Marcos Eke, PA-C       Or  . ondansetron [facility]) injection 4 mg  4 mg Intravenous Q6H  PRN Marcos Eke, PA-C      . prismasol BGK 0/2.5 5,000 mL dialysis replacement fluid   CRRT Continuous Terrial Rhodes, MD 300 mL/hr at 03/25/16 0410    . prismasol BGK 0/2.5 5,000 mL dialysis replacement fluid   CRRT Continuous Terrial Rhodes, MD 500 mL/hr at 03/25/16 [phone]    . prismasol BGK 4/2.5 5,000 mL dialysis solution   CRRT Continuous Terrial Rhodes, MD 1,500 mL/hr at 03/25/16 1329    . sodium chloride flush (NS) 0.9 % injection 10-40 mL  10-40 mL Intracatheter Q12H Rolly Salter, MD   10 mL at 03/25/16 0900  . sodium chloride flush (NS) 0.9 % injection 10-40 mL  10-40 mL Intracatheter PRN Rolly Salter, MD      . sodium chloride flush (NS) 0.9 % injection 3 mL  3 mL Intravenous Q12H Marcos Eke, PA-C   3 mL at 03/25/16 0900  . vancomycin (VANCOCIN) 50 mg/mL oral solution 250 mg  250 mg Oral QID Nelda Bucks, MD   250 mg at 03/25/16 1400  . warfarin (COUMADIN) tablet 3 mg  3 mg Oral ONCE-1800 Sherron Monday, [facility]      . Warfarin - Pharmacist Dosing Inpatient   Does not apply [id number] Nelda Bucks, MD        Allergies  Allergen Reactions  . Entresto [Sacubitril-Valsartan] Other (See Comments)    On MAR    REVIEW OF SYSTEMS:  (Positives checked otherwise negative)  Cannot be obtained as patient can stay awake long enough to answer  Physical Examination  Filed Vitals:   03/25/16 1200 03/25/16 1300 03/25/16 1400 03/25/16 1500  BP: 116/63 103/62 105/58 106/62  Pulse: 74 71 72 72  Temp: 97.4 F (36.3 C)     TempSrc: Oral     Resp: 24 21 21 16   Height:      Weight:      SpO2: 91% 100% 99% 98%   Body mass index is 45.01 kg/(m^2).  General: somulent, ill appearing, morbidly obese  Head: [place]/AT  Ear/Nose/Throat: Hearing grossly intact, nares w/o erythema or drainage, oropharynx w/o Erythema/Exudate, Mallampati score: 3  Eyes: PERRL, EOMI  Neck: Supple, no nuchal rigidity, no palpable LAD  Pulmonary: Sym exp, distant BS, no obvious  wheezing  Cardiac: RRR, Nl S1, S2, no Murmurs, rubs or gallops  Vascular: Vessel Right Left  Radial Palpable Palpable  Ulnar Not Palpable Not Palpable  Brachial Palpable Palpable  Carotid Palpable, without bruit  Palpable, without bruit  Aorta Not palpable due to pannus N/A  Femoral Not Palpable due to pannus Not Palpable due to pannus  Popliteal Not palpable Not palpable  PT Not Palpable Not Palpable  DP Not Palpable Not Palpable   Gastrointestinal: soft, NTND, -G/R, - HSM, - masses, - CVAT B  Musculoskeletal: not able to test M/S as patient kept falling asleep, no ischemic changes  Neurologic: cannot test due to somnolence  Psychiatric: cannot test due to somnolence  Dermatologic: See M/S exam for extremity exam, no rashes otherwise noted  Lymph : No Cervical LAD, cannot check Axillary or Inguinal areas as patient can't cooperate with exam  Laboratory: CBC:    Component Value Date/Time   WBC 15.0* 03/25/2016 0500   WBC 11.3 03/17/2016   RBC 3.03* 03/25/2016 0500   HGB 8.5* 03/25/2016 0500   HCT 27.0* 03/25/2016 0500   PLT 147* 03/25/2016 0500   MCV 89.1 03/25/2016 0500   MCH 28.1 03/25/2016 0500   MCHC 31.5 03/25/2016 0500   RDW 18.1* 03/25/2016 0500   LYMPHSABS 0.8 03/20/2016 1227   MONOABS 0.7 03/20/2016 1227   EOSABS 0.2 03/20/2016 1227   BASOSABS 0.1 03/20/2016 1227    BMP:    Component Value Date/Time   NA 140 03/25/2016 0500   NA 140 03/25/2016 0500   NA 138 03/17/2016   K 3.9 03/25/2016 0500   K 3.9 03/25/2016 0500   CL 104 03/25/2016 0500   CL 104 03/25/2016 0500   CO2 26 03/25/2016 0500   CO2 26 03/25/2016 0500   GLUCOSE 260* 03/25/2016 0500   GLUCOSE 258* 03/25/2016 0500   BUN 20 03/25/2016 0500   BUN 20 03/25/2016 0500   BUN 59* 03/17/2016   CREATININE 2.27* 03/25/2016 0500   CREATININE 2.27* 03/25/2016 0500   CREATININE 3.3* 03/17/2016   CALCIUM 8.3* 03/25/2016 0500   CALCIUM 8.4* 03/25/2016 0500   GFRNONAA 21* 03/25/2016 0500    GFRNONAA 21* 03/25/2016 0500   GFRAA 25* 03/25/2016 0500   GFRAA 25* 03/25/2016 0500    Coagulation: Lab Results  Component Value Date   INR 2.06* 03/25/2016   INR 2.08* 03/24/2016   INR 3.02* 03/23/2016   No results found for: PTT  Lipids:    Component Value Date/Time   CHOL 49 03/17/2016   TRIG 68 03/17/2016   HDL 14* 03/17/2016   LDLCALC 21 03/17/2016    Radiology: No results found.   Medical Decision Making  Brandy Dudley is a 65 y.o. female who presents with ESRD requiring hemodialysis, RIJV TDC. CDiff Colitis, Acute CHF exacerbation   Given still having diarrhea, some concerns for contaminating a femoral TDC.   Also given fully anticoagulated, INR 2.06, I don't think she is ready for placement of any TDC.  In emergency circumstances, I have placed TDC while fully anticoagulated, but as she already some temporary access, I can't just the risks.  Defer to primary team method of reversal, would aim for target INR 1.3.  Pt is on schedule tomorrow AM but I suspect it might be Mon/Tues at the earliest before she will be ready.  In regards to permanent access, go ahead and get the vein mapping, but it is unclear if she will tolerate HD, so I don't intend to place anything until this issue is resolved.   Leonides Sake, MD [facility] Office: [phone] Pager: [phone]  03/25/2016, 3:12 PM

## 2016-03-25 NOTE — Progress Notes (Signed)
Patient ID: Brandy Dudley, female   DOB: Mar 01, 1951, 65 y.o.   MRN: [id number]      Referring Physician: Dr. Glade Lloyd PCP: Not in system Primary Cardiologist: Previously in [place]  Subjective:    HPI: Brandy Dudley is a 65 y.o. admitted from [facility] to IM service per notes for further evaluation for C. Diff colitis,  Acute CHF exacerbation, and ARF on CKD stage III. HF team consulted to assist with fluid management.   Started on CVVH 03/22/16.  Started on low dose norepinephrine yesterday to support BP with CVVH, now stopped. Was getting up to 300 cc/hr off.  SBP 100s-110s on norepinephrine 5.  CVP remains > 20.  Weight is coming down.   Diarrhea frequency has significantly decreased (slightly worse last night), hemoglobin trending up.  INR 2.08.    Feeling better. Breathing better.  Denies dizziness or lightheadedness. No CP.   Objective:   Weight Range: 262 lb 5.6 oz (119 kg) Body mass index is 45.01 kg/(m^2).   Vital Signs:   Temp:  [97.5 F (36.4 C)-97.8 F (36.6 C)] 97.6 F (36.4 C) (04/29 0800) Pulse Rate:  [64-88] 65 (04/29 0800) Resp:  [12-25] 22 (04/29 0800) BP: (92-158)/(55-84) 100/62 mmHg (04/29 0800) SpO2:  [92 %-100 %] 98 % (04/29 0800) Weight:  [262 lb 5.6 oz (119 kg)] 262 lb 5.6 oz (119 kg) (04/29 0405) Last BM Date: 03/24/16  Weight change: Filed Weights   03/23/16 0445 03/24/16 0500 03/25/16 0405  Weight: 286 lb 13.1 oz (130.1 kg) 273 lb 13 oz (124.2 kg) 262 lb 5.6 oz (119 kg)    Intake/Output:   Intake/Output Summary (Last 24 hours) at 03/25/16 0816 Last data filed at 03/25/16 0800  Gross per 24 hour  Intake 1014.71 ml  Output   6621 ml  Net -5606.29 ml     Physical Exam: General: Appears older than stated age, NAD. on CVVH HEENT: normal Neck: supple. JVP 16+. Carotids 2+ bilat; no bruits. No thyromegaly or nodule noted Cor: PMI nondisplaced. RRR. + TR/MR murmur Lungs: Scattered crackles. Abdomen: Obese, tight, umbilical hernia, non-tender,  mild/mod distention, no HSM. +BS  Extremities: no cyanosis, clubbing, rash. 3-4+ edema up to flanks.  Neuro: Alert/orientedx3, cranial nerves grossly intact. moves all 4 extremities w/o difficulty. Affect pleasant  Telemetry: NSR 70s  Labs: CBC  Recent Labs  03/24/16 0415 03/25/16 0500  WBC 13.7* 15.0*  HGB 8.6* 8.5*  HCT 27.5* 27.0*  MCV 87.0 89.1  PLT 162 147*   Basic Metabolic Panel  Recent Labs  03/24/16 0415 03/24/16 1557 03/25/16 0500  NA 139 141 140  140  K 5.4* 4.2 3.9  3.9  CL 105 105 104  104  CO2 25 28 26  26   GLUCOSE 149* 201* 260*  258*  BUN 32* 25* 20  20  CREATININE 2.79* 2.54* 2.27*  2.27*  CALCIUM 8.0* 8.4* 8.3*  8.4*  MG 2.4  --  2.4  PHOS 2.1* 2.1* 1.8*   Liver Function Tests  Recent Labs  03/24/16 1557 03/25/16 0500  ALBUMIN 2.6* 2.7*   No results for input(s): LIPASE, AMYLASE in the last 72 hours. Cardiac Enzymes No results for input(s): CKTOTAL, CKMB, CKMBINDEX, TROPONINI in the last 72 hours.  BNP: BNP (last 3 results)  Recent Labs  03/14/16 1457 03/20/16 1227  BNP 1509.6* 1489.8*    ProBNP (last 3 results) No results for input(s): PROBNP in the last 8760 hours.   D-Dimer No results for input(s): DDIMER in the  last 72 hours. Hemoglobin A1C No results for input(s): HGBA1C in the last 72 hours. Fasting Lipid Panel No results for input(s): CHOL, HDL, LDLCALC, TRIG, CHOLHDL, LDLDIRECT in the last 72 hours. Thyroid Function Tests No results for input(s): TSH, T4TOTAL, T3FREE, THYROIDAB in the last 72 hours.  Invalid input(s): FREET3  Other results:     Imaging/Studies:  Dg Abd Portable 1v  03/23/2016  CLINICAL DATA:  Bloody diarrhea, colitis, hemodialysis dependent. EXAM: PORTABLE ABDOMEN - 1 VIEW COMPARISON:  None available FINDINGS: Scattered air and stool throughout the bowel. No significant obstruction or dilatation. Mild gastric distention. Right femoral dialysis catheter noted. Degenerative changes of  the lower lumbar spine. IMPRESSION: No significant obstruction pattern or ileus. Electronically Signed   By: Judie Petit.  Shick M.D.   On: 03/23/2016 11:30    Latest Echo 03/21/16  - Left ventricle: The cavity size was mildly dilated. Wall  thickness was normal. Systolic function was moderately reduced.  The estimated ejection fraction was in the range of 35% to 40%.  Diffuse hypokinesis. There is akinesis of the inferolateral and  inferior myocardium. Features are consistent with a pseudonormal  left ventricular filling pattern, with concomitant abnormal  relaxation and increased filling pressure (grade 2 diastolic  dysfunction). - Ascending aorta: The ascending aorta was mildly dilated. - Mitral valve: Calcified annulus. Mildly thickened leaflets .  There was mild regurgitation. - Left atrium: The atrium was mildly dilated. - Right ventricle: The cavity size was moderately dilated. - Right atrium: The atrium was moderately dilated. - Atrial septum: The septum bowed from right to left, consistent  with increased right atrial pressure. - Tricuspid valve: There was severe regurgitation. - Pulmonary arteries: PA peak pressure: 38 mm Hg (S). - Pericardium, extracardiac: A small pericardial effusion was  identified.  Impressions:  - Global hypokinesis with inferior and inferior lateral akinesis;  overall moderately reduced LV function; grade 2 diastolic  dysfunction; mild LAE; moderate RAE/RVE; small pericardial  effusion; mild MR; severe TR.  Latest Cath unnkown   Medications:     Scheduled Medications: . antiseptic oral rinse  7 mL Mouth Rinse BID  . atorvastatin  10 mg Oral Daily  . budesonide (PULMICORT) nebulizer solution  0.25 mg Nebulization BID  . famotidine (PEPCID) IV  20 mg Intravenous Q24H  . feeding supplement (GLUCERNA SHAKE)  237 mL Oral BID BM  . heparin  4,000 Units Intravenous Once  . hydrocortisone sod succinate (SOLU-CORTEF) inj  50 mg Intravenous  Q6H  . insulin aspart  0-9 Units Subcutaneous TID WC  . multivitamin with minerals  1 tablet Oral Daily  . sodium chloride flush  10-40 mL Intracatheter Q12H  . sodium chloride flush  3 mL Intravenous Q12H  . vancomycin  250 mg Oral QID  . Warfarin - Pharmacist Dosing Inpatient   Does not apply q1800    Infusions: . norepinephrine (LEVOPHED) Adult infusion Stopped (03/24/16 2308)  . dialysis replacement fluid (prismasate) 300 mL/hr at 03/25/16 0410  . dialysis replacement fluid (prismasate) 500 mL/hr at 03/25/16 0728  . dialysate (PRISMASATE) 1,500 mL/hr at 03/25/16 0703    PRN Medications: acetaminophen **OR** acetaminophen, albuterol, heparin, HYDROcodone-acetaminophen, LORazepam, ondansetron **OR** ondansetron (ZOFRAN) IV, sodium chloride flush   Assessment/Plan   Brandy Dudley is a 65 y.o. admitted from [place] [facility] to IM service per notes for further evaluation for C. Diff colitis,  Acute CHF exacerbation, and ARF on CKD stage III. HF team consulted to assist with fluid management.   1. Acute on  chronic systolic CHF: Ischemic cardiomyopathy with history of prior CAD (details not available). She has an ICD, thinks Environmental manager. Echo 03/21/16 35-40% with Grade 2 DD, mod dilated RV, mild LAE, mod RAE, Severe TR, Peak PA 38 mm Hg. She remains markedly volume overload with JVD and anasarca. This is complicated by C difficile colitis and and AKI on CKD with creatinine up to 4.46 this admission.  Weight up at least 30 lbs by records. Now on CVVH, norepinephrine at low dose previously used to facilitate volume removal. Off norepi.  Good UF overnight, weight down considerably but CVP still elevated.    - Hold coreg for now with soft BP on CVVH.  - Continue fluid removal via CVVH with low dose norepinephrine support to facilitate. 2. AKI on CKD:  - Now on CVVH.  - She presented with C difficile diarrhea but did not appear dehydrated.  3. R IJ DVT in 3/17: on Coumadin, INR on admit 6.38.  INR 2.08 today.  She was initially having blood mixed in her diarrhea, this has resolved and hemoglobin is up. Restarted coumadin, aim for INR 2-2.5.  4. CAD: History not available. On ASA + Plavix in the past. This was held due to hematuria at last admission (foley trauma) and need to use warfarin. Continue atorvastatin.   5. C difficile colitis: She was on clindamycin recently for MRSA breast abscess, suspect this led to predisposition.Now on po vancomycin.  Diarrhea slowing, no longer bloody.   6. Anemia: Had blood in stool, likely related to C difficile colitis and high INR.  This seems to have resolved and hemoglobin is higher.  Restarted warfarin, aiming for INR 2-2.5 (2.08 today).  She has recent right IJ DVT.   Length of Stay: 5  Brandy Dudley  03/25/2016, 8:16 AM  Advanced Heart Failure Team Pager [phone] (M-F; 7a - 4p)  Please contact [facility] for night-coverage after hours (4p -7a ) and weekends on [email or web address]

## 2016-03-25 NOTE — Progress Notes (Signed)
PULMONARY / CRITICAL CARE MEDICINE   Name: Brandy Dudley MRN: [id number] DOB: 06/17/1951    ADMISSION DATE:  03/20/2016 CONSULTATION DATE:  03/20/2016  REFERRING MD:  Dr. Shirlee Latch  CHIEF COMPLAINT:  SOB  HISTORY OF PRESENT ILLNESS:   [name] is a 65 year old female with a past medical history significant for CHF (EF 35-40%), CKD Stage III, history of RIJ DVT on warfarin, HTN, HLD, COPD who was admitted on 03/20/16 for C. Diff colitis and acute respiratory distress/AoCHF. She was brought from [facility]. She was recently hospitalized from 3/15 to 4/05 in [place], requiring HD at that time. She has had minimal urine output since admission depite being on lasix gtt and metolazone. Co-ox has been adequate at 71% suggesting she is not in a low output state. Remains markedly volume overloaded. Progressive worsening renal function since admission up to Cr of 4.48 today. Nephrology was consulted today who recommends CVVHD initiation. PCCM called for HD cath placement and transfer to the ICU for further care.   SUBJECTIVE:  [place] on CRRT. Off pressors.   VITAL SIGNS: BP 99/62 mmHg  Pulse 73  Temp(Src) 97.6 F (36.4 C) (Oral)  Resp 19  Ht 5\' 4"  (1.626 m)  Wt 262 lb 5.6 oz (119 kg)  BMI 45.01 kg/m2  SpO2 97%  HEMODYNAMICS: CVP:  [22 mmHg-32 mmHg] 25 mmHg  VENTILATOR SETTINGS:    INTAKE / OUTPUT: I/O last 3 completed shifts: In: 1368.4 [P.O.:670; I.V.:465.4; Other:133; IV Piggyback:100] Out: 9818 [Urine:95; QSXQK:2081; Stool:70]  PHYSICAL EXAMINATION: General:  Chronically ill appearing obese female, lying in bed, in NAD Neuro: AAOx4, no focal deficits HEENT: PERRLA, O2 via [place] Cardiovascular:   RRR, systolic murmur, elevated JVD Lungs: present bilaterally Abdomen: Obese, soft, nontender,  BS+ Skin: warm, dry, 3-4+ pitting edema up to flanks   LABS:  BMET  Recent Labs Lab 03/24/16 0415 03/24/16 1557 03/25/16 0500  NA 139 141 140  140  K 5.4* 4.2 3.9  3.9  CL 105  105 104  104  CO2 25 28 26  26   BUN 32* 25* 20  20  CREATININE 2.79* 2.54* 2.27*  2.27*  GLUCOSE 149* 201* 260*  258*    Electrolytes  Recent Labs Lab 03/23/16 0413  03/24/16 0415 03/24/16 1557 03/25/16 0500  CALCIUM 7.9*  7.8*  < > 8.0* 8.4* 8.3*  8.4*  MG 2.2  --  2.4  --  2.4  PHOS 2.9  2.9  < > 2.1* 2.1* 1.8*  < > = values in this interval not displayed.  CBC  Recent Labs Lab 03/23/16 1708 03/24/16 0415 03/25/16 0500  WBC 12.0* 13.7* 15.0*  HGB 8.5* 8.6* 8.5*  HCT 26.7* 27.5* 27.0*  PLT 169 162 147*    Coag's  Recent Labs Lab 03/23/16 0413 03/24/16 0415 03/25/16 0500  APTT 83* 47* 45*  INR 3.02* 2.08* 2.06*    Sepsis Markers  Recent Labs Lab 03/23/16 1200  LATICACIDVEN 1.0    ABG No results for input(s): PHART, PCO2ART, PO2ART in the last 168 hours.  Liver Enzymes  Recent Labs Lab 03/21/16 0807  03/24/16 0415 03/24/16 1557 03/25/16 0500  AST 22  --   --   --   --   ALT 11*  --   --   --   --   ALKPHOS 110  --   --   --   --   BILITOT 1.5*  --   --   --   --  ALBUMIN 2.1*  < > 2.6* 2.6* 2.7*  < > = values in this interval not displayed.  Cardiac Enzymes No results for input(s): TROPONINI, PROBNP in the last 168 hours.  Glucose  Recent Labs Lab 03/23/16 2130 03/24/16 0758 03/24/16 1205 03/24/16 1554 03/24/16 2213 03/25/16 0745  GLUCAP 156* 123* 149* 183* 226* 243*    Imaging No results found. STUDIES:  4/26 Renal US: No hydronephrosis, bilateral increased echogenicity 4/27 pCXR>> cardiomegaly and vascular congestion  CULTURES: 4/25 Urine Culture >> Multiple species  4/25 C. Diff >> Ag+ Toxin -  ANTIBIOTICS: 4/24 Vancomycin PO>>>  SIGNIFICANT EVENTS: 4/24 admitted for C. Diff colitis and acute respiratory distress/AoCHF 4/26 transferred to ICU for CVVHD due to AKI/CKD related to decompensated biventricular CHF with massive volume overload developing hypotension.   LINES/TUBES: 4/25 LIJ CVL >>> 4/26 R  fem HD cath >>>   DISCUSSION: 65 year old female with CHF (EF 30-35%, grade 2 DD), failed diuresis.  Now with worsening oliguric renal failure / probable cardiorenal syndrome.  Seen by nephrology who requests transfer to ICU for initiation of CVVHD.   ASSESSMENT / PLAN:  CARDIOVASCULAR A:  Probable cardiorenal syndrome. Hx combined CHF (echo from 03/21/16 with EF 35-40%, grade 2DD), ICM, HTN, CAD, RIJ DVT (on warfarin). P:  Volume removal via CVVH.  Off pressors Continue outpatient atorvastatin. Hold outpatient  carvedilol, torsemide. Continue warfarin with INR goal 2-2.5  RENAL A:  AoCKD - now with worsening oliguric renal failure. Hypokalemia>> resolved, mild Hyperkalemia P:  Nephrology following. Replace electrolytes as indicated. BMP trend daily Continue volume removal  PULMONARY A: Acute hypoxic respiratory failure - due to CHF / volume overload. Hx COPD. PAH by echo - PAP 38 on echo from 03/21/16. P:  Continue supplemental O2 as needed to maintain SpO2 > 92%. Continue BD's. CXR in AM.   GASTROINTESTINAL A:  Nutrition. cdiff Bloody anus noted - coagulapthy, cdiff>> bleeding resolved P:  Heart healthy diet Continue pepcid Oral vanc higher dose Lactic acid normal.  HEMATOLOGIC A:  Supratherapeutic INR - on warfarin due to RIJ DVT, cdiff>> resolved INR 2 Anemia - chronic. VTE Prophylaxis. P:  Restart warfarin with goal 2-2.5 Monitor INR. Transfuse for Hgb < 7. SCD's. CBC trend in pm  May need ffp if bleeding again  INFECTIOUS A:  C.difficile colitis.  P:  Abx as above (PO vanco)  ENDOCRINE A:  DM. Relative AI (cortisol 18) P:  SSI. On stress dose steroids. Will stop tomorrow if she remains off pressors.   NEUROLOGIC A:  Anxiety Dementia  P:  Continue low dose lorazepam (home med)  Family updated: None at bedside Interdisciplinary Family Meeting v Palliative Care Meeting: Due by: 03/28/16.  Critical care  time- 30 mins.  Chilton Greathouse MD [facility] Pager [phone] If no answer or after 3pm call: [phone] 03/25/2016, 11:26 AM

## 2016-03-25 NOTE — Progress Notes (Signed)
Spoke with Canary Brim NP about pt blood sugar of 275. Advised to continue with the sliding scale and notify MD if sugar results above 400. Will continue to monitor closely.

## 2016-03-25 NOTE — Progress Notes (Signed)
ANTICOAGULATION CONSULT NOTE - Initial Consult Pharmacy Consult for Coumadin Indication: DVT  Allergies  Allergen Reactions  . Entresto [Sacubitril-Valsartan] Other (See Comments)    On MAR    Patient Measurements: Height:  (162.6 cm) Weight: 262 lb 5.6 oz (119 kg) IBW/kg (Calculated) : 54.7  Vital Signs: Temp: 97.6 F (36.4 C) (04/29 0800) Temp Source: Oral (04/29 0800) BP: 109/67 mmHg (04/29 1000) Pulse Rate: 63 (04/29 1000)  Labs:  Recent Labs  03/23/16 0413  03/23/16 1708 03/24/16 0415 03/24/16 1557 03/25/16 0500  HGB 7.9*  --  8.5* 8.6*  --  8.5*  HCT 25.0*  --  26.7* 27.5*  --  27.0*  PLT 171  --  169 162  --  147*  APTT 83*  --   --  47*  --  45*  LABPROT 30.7*  --   --  23.2*  --  23.1*  INR 3.02*  --   --  2.08*  --  2.06*  CREATININE 3.95*  3.78*  < >  --  2.79* 2.54* 2.27*  2.27*  < > = values in this interval not displayed.  Estimated Creatinine Clearance: 31.4 mL/min (by C-G formula based on Cr of 2.27).   Medical History: Past Medical History  Diagnosis Date  . Coronary artery disease   . CHF (congestive heart failure) (HCC)   . COPD (chronic obstructive pulmonary disease) (HCC)   . Hypertension     Medications:  Prescriptions prior to admission  Medication Sig Dispense Refill Last Dose  . acetaminophen (TYLENOL) 325 MG tablet Take 650 mg by mouth every 6 (six) hours as needed for mild pain.   Past Week at Unknown time  . albuterol (PROVENTIL HFA;VENTOLIN HFA) 108 (90 Base) MCG/ACT inhaler Inhale 2 puffs into the lungs every 6 (six) hours as needed for wheezing or shortness of breath.    rescue at rescue  . atorvastatin (LIPITOR) 10 MG tablet Take 10 mg by mouth daily.   03/19/2016 at Unknown time  . beclomethasone (QVAR) 80 MCG/ACT inhaler Inhale 2 puffs into the lungs 2 (two) times daily.   03/19/2016 at Unknown time  . carvedilol (COREG) 12.5 MG tablet Take 12.5 mg by mouth 2 (two) times daily.   03/19/2016 at 2107  . insulin glargine  (LANTUS) 100 UNIT/ML injection Inject 18 Units into the skin at bedtime.   03/19/2016 at Unknown time  . insulin lispro (HUMALOG) 100 UNIT/ML injection Inject 5 Units into the skin 3 (three) times daily with meals. For CBG > 180   03/19/2016 at Unknown time  . LORazepam (ATIVAN) 0.5 MG tablet Take 0.5 mg by mouth 2 (two) times daily. Stop date 03/20/16   03/19/2016 at unk  . omeprazole (PRILOSEC) 20 MG capsule Take 1 capsule (20 mg total) by mouth daily. 30 capsule 0 03/20/2016 at Unknown time  . torsemide (DEMADEX) 100 MG tablet Take 20-100 mg by mouth See admin instructions. Per MAR, pt given  in am,   in afternoon, and  in evening on 4/23   unk at unk  . warfarin (COUMADIN) 2 MG tablet Take 2 mg by mouth daily. Take along with 2.5 mg= 4.5 mg daily   03/19/2016 at 2107  . warfarin (COUMADIN) 2.5 MG tablet Take 2.5 mg by mouth daily. Take along with 2 mg= 4.5 mg daily   03/19/2016 at 2107  . Inulin (FIBER CHOICE) 1.5 g CHEW Chew 2 tablets by mouth daily as needed (for constipation).    unk  at unk    Assessment: 65 y.o. female with admitted with CHF exacerbation/ARF/C diff colitis, recent R IJ DVT, INR now within goal range so Coumadin to be resumed. Received 2.5mg *1. INR currently 2.06.  PTA Coumadin regimen: 4.5 mg daily  Goal of Therapy:  INR 2-3 Monitor platelets by anticoagulation protocol: Yes   Plan:  Coumadin 3 mg today Daily INR, CBC   Sherron Monday, PharmD Clinical Pharmacy Resident Pager: [phone] 03/25/2016 10:33 AM

## 2016-03-25 NOTE — Progress Notes (Signed)
Subjective:   -6.5L CVVHD, UOP 70cc.  Patient reports that she is doing well.  She notes she was depressed yesterday but mood is a little improved this am.  Denies abdominal pain, nausea, vomiting.  She notes that abdominal fullness is improving.  Objective Vital signs in last 24 hours: Filed Vitals:   03/25/16 0300 03/25/16 0405 03/25/16 0500 03/25/16 0600  BP: 119/70 106/59 103/60 104/69  Pulse: 66 73 73 72  Temp:  97.5 F (36.4 C)    TempSrc:  Oral    Resp: Height:   (1.626 m)    Weight:  262 lb 5.6 oz (119 kg)    SpO2: 94% 97% 97% 96%   Weight change: -11 lb 7.4 oz (-5.2 kg)  Intake/Output Summary (Last 24 hours) at 03/25/16 0731 Last data filed at 03/25/16 0700  Gross per 24 hour  Intake 1013.51 ml  Output   6605 ml  Net -5591.49 ml   Labs: Basic Metabolic Panel:  Recent Labs Lab 03/24/16 0415 03/24/16 1557 03/25/16 0500  NA 139 141 140  140  K 5.4* 4.2 3.9  3.9  CL 105 105 104  104  CO2 GLUCOSE 149* 201* 260*  258*  BUN 32* 25* 20  20  CREATININE 2.79* 2.54* 2.27*  2.27*  CALCIUM 8.0* 8.4* 8.3*  8.4*  PHOS 2.1* 2.1* 1.8*   Liver Function Tests:  Recent Labs Lab 03/21/16 0807  03/24/16 0415 03/24/16 1557 03/25/16 0500  AST 22  --   --   --   --   ALT 11*  --   --   --   --   ALKPHOS 110  --   --   --   --   BILITOT 1.5*  --   --   --   --   PROT 5.7*  --   --   --   --   ALBUMIN 2.1*  < > 2.6* 2.6* 2.7*  < > = values in this interval not displayed. No results for input(s): LIPASE, AMYLASE in the last 168 hours. No results for input(s): AMMONIA in the last 168 hours. CBC:  Recent Labs Lab 03/20/16 1227 03/22/16 0520 03/23/16 0413 03/23/16 1708 03/24/16 0415 03/25/16 0500  WBC 7.5 8.2 10.0 12.0* 13.7* 15.0*  NEUTROABS 5.7  --   --   --   --   --   HGB 9.0* 8.3* 7.9* 8.5* 8.6* 8.5*  HCT 28.4* 26.0* 25.0* 26.7* 27.5* 27.0*  MCV 85.5 85.2 86.2 85.9 87.0 89.1  PLT 165 181 171 169 162 147*   Cardiac  Enzymes: No results for input(s): CKTOTAL, CKMB, CKMBINDEX, TROPONINI in the last 168 hours. CBG:  Recent Labs Lab 03/23/16 2130 03/24/16 0758 03/24/16 1205 03/24/16 1554 03/24/16 2213  GLUCAP 156* 123* 149* 183* 226*    Iron Studies: No results for input(s): IRON, TIBC, TRANSFERRIN, FERRITIN in the last 72 hours. Studies/Results: Dg Abd Portable 1v  03/23/2016  CLINICAL DATA:  Bloody diarrhea, colitis, hemodialysis dependent. EXAM: PORTABLE ABDOMEN - 1 VIEW COMPARISON:  None available FINDINGS: Scattered air and stool throughout the bowel. No significant obstruction or dilatation. Mild gastric distention. Right femoral dialysis catheter noted. Degenerative changes of the lower lumbar spine. IMPRESSION: No significant obstruction pattern or ileus. Electronically Signed   By: Judie Petit.  Shick M.D.   On: 03/23/2016 11:30   Medications: Infusions: . norepinephrine (LEVOPHED) Adult infusion Stopped (03/24/16 2308)  . dialysis  replacement fluid (prismasate) 300 mL/hr at 03/25/16 0410  . dialysis replacement fluid (prismasate) 500 mL/hr at 03/25/16 0728  . dialysate (PRISMASATE) 1,500 mL/hr at 03/25/16 0703    Scheduled Medications: . antiseptic oral rinse  7 mL Mouth Rinse BID  . atorvastatin  10 mg Oral Daily  . budesonide (PULMICORT) nebulizer solution  0.25 mg Nebulization BID  . famotidine (PEPCID) IV  20 mg Intravenous Q24H  . feeding supplement (GLUCERNA SHAKE)  237 mL Oral BID BM  . heparin  4,000 Units Intravenous Once  . hydrocortisone sod succinate (SOLU-CORTEF) inj  50 mg Intravenous Q6H  . insulin aspart  0-9 Units Subcutaneous TID WC  . multivitamin with minerals  1 tablet Oral Daily  . sodium chloride flush  10-40 mL Intracatheter Q12H  . sodium chloride flush  3 mL Intravenous Q12H  . vancomycin  250 mg Oral QID  . Warfarin - Pharmacist Dosing Inpatient   Does not apply q1800   I have reviewed scheduled and prn medications.  Physical Exam: General: awake, NAD,  interactive, pleasant Heart: RRR, no murmurs Lungs: breathing normally on room air. Seemingly CTAB Abdomen: NT/ND, soft, +BS Extremities: 3+ edema to LE and sacrum (improving) Dialysis Access: right femoral triple lumen    Assessment/ Plan: Pt is a 65 y.o. yo female who was admitted on 03/20/2016 with C Diff colitis and Acute respiratory distress/ AoCHF   1. AKI/CKD in the setting of decompensated biventricular CHF: On CVVHD.  Off pressors.  Limit fluids as able.   2. Volume/ Pressure: BP stable.  Off pressors since yesterday afternoon.  70cc UOP/24h. 6L off w/ CVVHD.  Continues to be volume overloaded but this is improving some.  Wt down 11 lbs. 3. Anemia: Hgb 8.5  4. H/o DVT - INR 2.06. Pharmacy following/ management per primary 5. GI bleed- no heparin with HD 6. CAD/iCM/CHF- cards managing. 7. DM2: SSI per primary 8. C Diff colitis- on Vanc PO per primary. 9. Hyperkalemia- resolved.    Continue CVVHD today, likely will transfer to IHD Monday.  Will c/s VVS for tunneled catheter.  Ashly Gottschalk, DO  03/25/2016,7:31 AM  LOS: 5 days     I have seen and examined this patient and agree with plan as outlined by Dr. Nadine Counts.  If Ms. Redditt continues to improve, she will need to have tunneled HD catheter and AVF/AVG.  Continue with CVVHDF for now and plan to stop tomorrow and transition to IHD on Monday as long as she remains off of pressors. Julien Nordmann Brynnlie Unterreiner,MD 03/25/2016 12:27 PM

## 2016-03-26 ENCOUNTER — Encounter (HOSPITAL_COMMUNITY): Payer: Medicare PPO

## 2016-03-26 LAB — CBC
HCT: 26 % — ABNORMAL LOW (ref 36.0–46.0)
Hemoglobin: 8.4 g/dL — ABNORMAL LOW (ref 12.0–15.0)
MCH: 28.6 pg (ref 26.0–34.0)
MCHC: 32.3 g/dL (ref 30.0–36.0)
MCV: 88.4 fL (ref 78.0–100.0)
PLATELETS: 151 10*3/uL (ref 150–400)
RBC: 2.94 MIL/uL — AB (ref 3.87–5.11)
RDW: 18.1 % — ABNORMAL HIGH (ref 11.5–15.5)
WBC: 15.9 10*3/uL — AB (ref 4.0–10.5)

## 2016-03-26 LAB — RENAL FUNCTION PANEL
ANION GAP: 7 (ref 5–15)
Albumin: 2.7 g/dL — ABNORMAL LOW (ref 3.5–5.0)
BUN: 15 mg/dL (ref 6–20)
CALCIUM: 8.5 mg/dL — AB (ref 8.9–10.3)
CO2: 27 mmol/L (ref 22–32)
Chloride: 104 mmol/L (ref 101–111)
Creatinine, Ser: 2.25 mg/dL — ABNORMAL HIGH (ref 0.44–1.00)
GFR calc Af Amer: 25 mL/min — ABNORMAL LOW (ref >60)
GFR calc non Af Amer: 22 mL/min — ABNORMAL LOW (ref >60)
GLUCOSE: 223 mg/dL — AB (ref 65–99)
Phosphorus: 1.6 mg/dL — ABNORMAL LOW (ref 2.5–4.6)
Potassium: 3.5 mmol/L (ref 3.5–5.1)
SODIUM: 138 mmol/L (ref 135–145)

## 2016-03-26 LAB — GLUCOSE, CAPILLARY
Glucose-Capillary: 178 mg/dL — ABNORMAL HIGH (ref 65–99)
Glucose-Capillary: 259 mg/dL — ABNORMAL HIGH (ref 65–99)
Glucose-Capillary: 275 mg/dL — ABNORMAL HIGH (ref 65–99)

## 2016-03-26 LAB — BASIC METABOLIC PANEL
Anion gap: 8 (ref 5–15)
BUN: 15 mg/dL (ref 6–20)
CALCIUM: 8.6 mg/dL — AB (ref 8.9–10.3)
CO2: 27 mmol/L (ref 22–32)
CREATININE: 2.25 mg/dL — AB (ref 0.44–1.00)
Chloride: 104 mmol/L (ref 101–111)
GFR calc Af Amer: 25 mL/min — ABNORMAL LOW (ref >60)
GFR calc non Af Amer: 22 mL/min — ABNORMAL LOW (ref >60)
GLUCOSE: 223 mg/dL — AB (ref 65–99)
Potassium: 3.5 mmol/L (ref 3.5–5.1)
Sodium: 139 mmol/L (ref 135–145)

## 2016-03-26 LAB — APTT: aPTT: 46 seconds — ABNORMAL HIGH (ref 24–37)

## 2016-03-26 LAB — PROTIME-INR
INR: 2.3 — AB (ref 0.00–1.49)
PROTHROMBIN TIME: 25.1 s — AB (ref 11.6–15.2)

## 2016-03-26 LAB — PHOSPHORUS: Phosphorus: 1.7 mg/dL — ABNORMAL LOW (ref 2.5–4.6)

## 2016-03-26 LAB — MAGNESIUM: Magnesium: 2.6 mg/dL — ABNORMAL HIGH (ref 1.7–2.4)

## 2016-03-26 MED ORDER — SODIUM CHLORIDE 0.9% FLUSH: 10.0000 mL | INTRAVENOUS | Status: DC | PRN

## 2016-03-26 MED ORDER — BUDESONIDE 0.5 MG/2ML IN SUSP
0.5000 mg | Freq: Two times a day (BID) | RESPIRATORY_TRACT | Status: DC
  Administered 2016-03-26 – 2016-04-06 (×18): 0.5 mg via RESPIRATORY_TRACT
  Filled 2016-03-26 (×21): qty 2

## 2016-03-26 MED ORDER — SODIUM CHLORIDE 0.9% FLUSH
10.0000 mL | Freq: Two times a day (BID) | INTRAVENOUS | Status: DC
  Administered 2016-03-26: 10 mL

## 2016-03-26 NOTE — Progress Notes (Signed)
   Daily Progress Note  Not candidate for [facility] placement.  Will roll case until tomorrow.  Defer to primary team reversal of anticoagulation.  Will NOT do case until INR <- 1.3.  Lab Results  Component Value Date   INR 2.30* 03/26/2016   INR 2.06* 03/25/2016   INR 2.08* 03/24/2016    Leonides Sake, MD [facility] Office: [phone] Pager: [phone]  03/26/2016, 7:25 AM

## 2016-03-26 NOTE — Progress Notes (Signed)
Subjective:   -7.5L CVVHD, UOP 50cc. (net of ~7L down yesterday). Patient reports that she is doing well.  She notes she was depressed due to the recent passing of her sister.   Objective Vital signs in last 24 hours: Filed Vitals:   03/26/16 0900 03/26/16 1000 03/26/16 1100 03/26/16 1200  BP: 127/85 113/63 108/66   Pulse: 73 66 71 66  Temp:    98.3 F (36.8 C)  TempSrc:    Oral  Resp: 23 19 17 18   Height:      Weight:      SpO2: 95% 96% 96% 96%   Weight change: -13 lb 10.7 oz (-6.2 kg)  Intake/Output Summary (Last 24 hours) at 03/26/16 1328 Last data filed at 03/26/16 0918  Gross per 24 hour  Intake    670 ml  Output   5974 ml  Net  -5304 ml   Labs: Basic Metabolic Panel:  Recent Labs Lab 03/25/16 0500 03/25/16 1600 03/26/16 0400  NA 140  140 136 139  138  K 3.9  3.9 3.6 3.5  3.5  CL 104  104 103 104  104  CO2 26  26 27 27  27   GLUCOSE 260*  258* 285* 223*  223*  BUN 20  20 18 15  15   CREATININE 2.27*  2.27* 2.19* 2.25*  2.25*  CALCIUM 8.3*  8.4* 8.3* 8.6*  8.5*  PHOS 1.8* 1.6* 1.7*  1.6*   Liver Function Tests:  Recent Labs Lab 03/21/16 0807  03/25/16 0500 03/25/16 1600 03/26/16 0400  AST 22  --   --   --   --   ALT 11*  --   --   --   --   ALKPHOS 110  --   --   --   --   BILITOT 1.5*  --   --   --   --   PROT 5.7*  --   --   --   --   ALBUMIN 2.1*  < > 2.7* 2.6* 2.7*  < > = values in this interval not displayed. No results for input(s): LIPASE, AMYLASE in the last 168 hours. No results for input(s): AMMONIA in the last 168 hours. CBC:  Recent Labs Lab 03/20/16 1227  03/23/16 0413 03/23/16 1708 03/24/16 0415 03/25/16 0500 03/26/16 0400  WBC 7.5  < > 10.0 12.0* 13.7* 15.0* 15.9*  NEUTROABS 5.7  --   --   --   --   --   --   HGB 9.0*  < > 7.9* 8.5* 8.6* 8.5* 8.4*  HCT 28.4*  < > 25.0* 26.7* 27.5* 27.0* 26.0*  MCV 85.5  < > 86.2 85.9 87.0 89.1 88.4  PLT 165  < > 171 169 162 147* 151  < > = values in this interval not  displayed. Cardiac Enzymes: No results for input(s): CKTOTAL, CKMB, CKMBINDEX, TROPONINI in the last 168 hours. CBG:  Recent Labs Lab 03/25/16 1238 03/25/16 1627 03/25/16 2142 03/26/16 0734 03/26/16 1152  GLUCAP 256* 275* 241* 178* 259*    Iron Studies: No results for input(s): IRON, TIBC, TRANSFERRIN, FERRITIN in the last 72 hours. Studies/Results: No results found. Medications: Infusions:    Scheduled Medications: . antiseptic oral rinse  7 mL Mouth Rinse BID  . atorvastatin  10 mg Oral Daily  . budesonide (PULMICORT) nebulizer solution  0.5 mg Nebulization BID  . famotidine  20 mg Oral Daily  . feeding supplement (GLUCERNA SHAKE)  237 mL Oral BID  BM  . insulin aspart  0-20 Units Subcutaneous TID WC  . insulin aspart  0-5 Units Subcutaneous QHS  . multivitamin with minerals  1 tablet Oral Daily  . sodium chloride flush  10-40 mL Intracatheter Q12H  . vancomycin  250 mg Oral QID   I have reviewed scheduled and prn medications.  Physical Exam: General: awake, NAD, interactive, pleasant Heart: RRR, no murmurs Lungs: breathing normally on room air. Seemingly CTAB Abdomen: NT/ND, soft, +BS Extremities: 2+ edema to LE and sacrum Dialysis Access: right femoral triple lumen    Assessment/ Plan: Pt is a 65 y.o. yo female who was admitted on 03/20/2016 with C Diff colitis and Acute respiratory distress/ AoCHF   1. AKI/CKD in the setting of decompensated biventricular CHF: Discontinue CVVHD today. No pressers at this time. Will start IHD tomorrow. Cr 2.25 today. Limit fluids as able. VVS consulted for tunnel cath >> needs INR to drop to 1.3 (currently 2.3), Will also likely need AVF placement during this stay. 2. Volume/ Pressure: BP stable. Off pressors.  50cc UOP/24h. 7.6L off w/ CVVHD.  Continues to be volume overloaded but this is improving some. Total Net -16.7L 3. Anemia: Hgb 8.4 4. H/o DVT - INR 2.3. Pharmacy following/ management per primary 5. GI bleed- no heparin  with HD 6. CAD/iCM/CHF- cards managing. 7. DM2: SSI per primary 8. C Diff colitis- on Vanc PO per primary. 9. Hyperkalemia- resolved.     Kathee Delton, MD,MS,  PGY2 03/26/2016 1:28 PM   LOS: 6 days   I have seen and examined this patient and agree with plan as outlined by Dr. Wende Mott.  Have stopped CVVHD and will plan for IHD starting tomorrow.  She will need tunneled HD cath and AVF/AVG and may need outpatient HD if her renal function does not recover. Jomarie Longs A Smiley Birr,MD 03/26/2016 1:45 PM

## 2016-03-26 NOTE — Progress Notes (Signed)
PULMONARY / CRITICAL CARE MEDICINE   Name: Brandy Dudley MRN: [id number] DOB: [birth year]    ADMISSION DATE:  03/20/2016 CONSULTATION DATE:  03/20/2016  REFERRING MD:  Dr. 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 Diff colitis and acute respiratory distress/AoCHF. She was brought from [facility]. She was recently hospitalized from 3/15 to 4/05 in [place], requiring HD at that time. She has had minimal urine output since admission depite being on lasix gtt and metolazone. Co-ox has been adequate at 71% suggesting she is not in a low output state. Remains markedly volume overloaded. Progressive worsening renal function since admission up to Cr of 4.48 today. Nephrology was consulted today who recommends CVVHD initiation. PCCM called for HD cath placement and transfer to the ICU for further care.   SUBJECTIVE: CRRT stopped am 4/30.  No acute events.  Pt reports feeling much better  VITAL SIGNS: BP 113/63 mmHg  Pulse 66  Temp(Src) 97.6 F (36.4 C) (Oral)  Resp 19  Ht 5\' 4"  (1.626 m)  Wt 248 lb 10.9 oz (112.8 kg)  BMI 42.66 kg/m2  SpO2 96%  HEMODYNAMICS: CVP:  [22 mmHg-30 mmHg] 22 mmHg  VENTILATOR SETTINGS:    INTAKE / OUTPUT: I/O last 3 completed shifts: In: 954.1 [P.O.:665; I.V.:139.1; Other:100; IV Piggyback:50] Out: 94327 [Urine:100; Other:11133]  PHYSICAL EXAMINATION: General:  Chronically ill appearing obese female, lying in bed, in NAD Neuro: AAOx4, no focal deficits HEENT: PERRLA, O2 via Brandy Dudley Cardiovascular:   RRR, systolic murmur  Lungs: even/non-labored, lungs bilaterally clear, diminished lower  Abdomen: Obese, soft, nontender,  BS+ Skin: warm, dry, improved BLE edema    LABS:  BMET  Recent Labs Lab 03/25/16 0500 03/25/16 1600 03/26/16 0400  NA 140   140 136 139  138  K 3.9  3.9 3.6 3.5  3.5  CL 104  104 103 104  104  CO2 26  26 27 27  27   BUN 20  20 18 15  15   CREATININE 2.27*  2.27* 2.19* 2.25*  2.25*  GLUCOSE 260*  258* 285* 223*  223*    Electrolytes  Recent Labs Lab 03/24/16 0415  03/25/16 0500 03/25/16 1600 03/26/16 0400  CALCIUM 8.0*  < > 8.3*  8.4* 8.3* 8.6*  8.5*  MG 2.4  --  2.4  --  2.6*  PHOS 2.1*  < > 1.8* 1.6* 1.7*  1.6*  < > = values in this interval not displayed.  CBC  Recent Labs Lab 03/24/16 0415 03/25/16 0500 03/26/16 0400  WBC 13.7* 15.0* 15.9*  HGB 8.6* 8.5* 8.4*  HCT 27.5* 27.0* 26.0*  PLT 162 147* 151    Coag's  Recent Labs Lab 03/24/16 0415 03/25/16 0500 03/26/16 0400  APTT 47* 45* 46*  INR 2.08* 2.06* 2.30*    Sepsis Markers  Recent Labs Lab 03/23/16 1200  LATICACIDVEN 1.0    ABG No results for input(s): PHART, PCO2ART, PO2ART in the last 168 hours.  Liver Enzymes  Recent Labs Lab 03/21/16 0807  03/25/16 0500 03/25/16 1600 03/26/16 0400  AST 22  --   --   --   --   ALT 11*  --   --   --   --   ALKPHOS 110  --   --   --   --  BILITOT 1.5*  --   --   --   --   ALBUMIN 2.1*  < > 2.7* 2.6* 2.7*  < > = values in this interval not displayed.  Cardiac Enzymes No results for input(s): TROPONINI, PROBNP in the last 168 hours.  Glucose  Recent Labs Lab 03/24/16 2213 03/25/16 0745 03/25/16 1238 03/25/16 1627 03/25/16 2142 03/26/16 0734  GLUCAP 226* 243* 256* 275* 241* 178*    Imaging No results found.   STUDIES:  4/26 Renal US: No hydronephrosis, bilateral increased echogenicity 4/27 pCXR>> cardiomegaly and vascular congestion  CULTURES: 4/25 Urine Culture >> Multiple species  4/25 C. Diff >> Ag+ Toxin -  ANTIBIOTICS: 4/24 Vancomycin PO >>  SIGNIFICANT EVENTS: 4/24  admitted for C. Diff colitis and acute respiratory distress/AoCHF 4/26  tx to ICU for CVVHD due to AKI/CKD 2/2 to decompensated biventricular CHF with volume  overload developing hypotension.  4/30  CVVHD stopped   LINES/TUBES: 4/25 LIJ CVL >> 4/26 R fem HD cath >>  DISCUSSION: 65 year old female with CHF (EF 30-35%, grade 2 DD), failed diuresis.  Now with worsening oliguric renal failure / probable cardiorenal syndrome.  Seen by nephrology who requests transfer to ICU for initiation of CVVHD.   ASSESSMENT / PLAN:  CARDIOVASCULAR A:  Probable cardiorenal syndrome. Hx combined CHF (echo from 03/21/16 with EF 35-40%, grade 2DD), ICM, HTN, CAD, RIJ DVT (on warfarin). P:  CVVHD discontinued 4/30 Continue outpatient atorvastatin. Hold outpatient carvedilol, torsemide. Continue warfarin with INR goal 2-2.5  RENAL A:  AoCKD - now with worsening oliguric renal failure. Hypokalemia>> resolved, mild Hyperkalemia P:  Nephrology following Replace electrolytes as indicated BMP daily  PULMONARY A: Acute hypoxic respiratory failure - due to CHF / volume overload. Hx COPD. PAH by echo - PAP 38 on echo from 03/21/16. P:  Continue supplemental O2 as needed to maintain SpO2 > 92% Continue BD's Intermittent CXR  GASTROINTESTINAL A:  Nutrition. C-diff Bloody Anus noted - coagulapthy, cdiff >> bleeding resolved P:  Heart healthy diet as tolerated Continue pepcid Oral vanc   HEMATOLOGIC A:  Supratherapeutic INR - on warfarin due to RIJ DVT, cdiff >> resolved INR 2 Anemia - chronic. VTE Prophylaxis. P:  Restart warfarin with goal 2-2.5 Monitor INR Transfuse for Hgb < 7 SCD's Trend CBC  INFECTIOUS A:  C.difficile colitis.  P:  Abx as above (PO vanco)  ENDOCRINE A:  DM. Relative AI (cortisol 18) P:  SSI  D/C Stress dose steroids, monitor BP closely   NEUROLOGIC A:  Anxiety Dementia  P:  Continue low dose lorazepam (home med)  Family updated: None at bedside Interdisciplinary Family Meeting v Palliative Care Meeting: Due by: 03/28/16.   Canary Brim, NP-C [facility] Pgr: [phone] or if no answer [phone] 03/26/2016, 10:49 AM  Attending note: I have seen and examined the patient with nurse practitioner/resident and agree with the note. History, labs and imaging reviewed.  64 Y/O with CHF (EF 35-40%) admitted with heart failure, AKI on CVVH. Cardio renal syndrome. Off CVVH. Plan for HD tomorrow On flagyl for C diff colitis. Looks stable.  Critical care time- 40 mins. This represents my time independent of the NPs time taking care of the patient.  Chilton Greathouse MD [facility] Pager [phone] If no answer or after 3pm call: [phone] 03/26/2016, 6:44 PM

## 2016-03-26 NOTE — Progress Notes (Signed)
Patient ID: Brandy Dudley, female   DOB: 04/02/1951, 65 y.o.   MRN: [id number]      Referring Physician: Dr. Glade Lloyd PCP: Not in system Primary Cardiologist: Previously in [place]  Subjective:    HPI: Brandy Dudley is a 65 y.o. admitted from [facility] to IM service per notes for further evaluation for C. Diff colitis,  Acute CHF exacerbation, and ARF on CKD stage III. HF team consulted to assist with fluid management.   Started on CVVH 03/22/16.  Started on low dose norepinephrine yesterday to support BP with CVVH, now stopped. Was getting up to 300 cc/hr off.  SBP 100s-110s on norepinephrine 5.  CVP remains > 20.  Weight is coming down.   Diarrhea frequency has significantly decreased (slightly worse last night), hemoglobin trending up.  INR 2.08.    Feeling better. No new complaints. Breathing better.  Denies dizziness or lightheadedness. No CP.   Objective:   Weight Range: 248 lb 10.9 oz (112.8 kg) Body mass index is 42.66 kg/(m^2).   Vital Signs:   Temp:  [97.1 F (36.2 C)-98.6 F (37 C)] 98.6 F (37 C) (04/30 0400) Pulse Rate:  [58-77] 61 (04/30 0700) Resp:  [16-24] 19 (04/30 0700) BP: (86-117)/(52-86) 105/62 mmHg (04/30 0700) SpO2:  [91 %-100 %] 97 % (04/30 0742) Weight:  [248 lb 10.9 oz (112.8 kg)] 248 lb 10.9 oz (112.8 kg) (04/30 0400) Last BM Date: 03/25/16  Weight change: Filed Weights   03/24/16 0500 03/25/16 0405 03/26/16 0400  Weight: 273 lb 13 oz (124.2 kg) 262 lb 5.6 oz (119 kg) 248 lb 10.9 oz (112.8 kg)    Intake/Output:   Intake/Output Summary (Last 24 hours) at 03/26/16 4098 Last data filed at 03/26/16 0749  Gross per 24 hour  Intake    675 ml  Output   7578 ml  Net  -6903 ml     Physical Exam: General: Appears older than stated age, NAD. on CVVH HEENT: normal Neck: supple. JVP 16+. Carotids 2+ bilat; no bruits. No thyromegaly or nodule noted Cor: PMI nondisplaced. RRR. + TR/MR murmur Lungs: Scattered crackles. Abdomen: Obese, tight, umbilical  hernia, non-tender, mild/mod distention, no HSM. +BS  Extremities: no cyanosis, clubbing, rash. 3-4+ edema up to flanks.  Neuro: Alert/orientedx3, cranial nerves grossly intact. moves all 4 extremities w/o difficulty. Affect pleasant  Telemetry: NSR 70s  Labs: CBC  Recent Labs  03/25/16 0500 03/26/16 0400  WBC 15.0* 15.9*  HGB 8.5* 8.4*  HCT 27.0* 26.0*  MCV 89.1 88.4  PLT 147* 151   Basic Metabolic Panel  Recent Labs  03/25/16 0500 03/25/16 1600 03/26/16 0400  NA 140  140 136 139  138  K 3.9  3.9 3.6 3.5  3.5  CL 104  104 103 104  104  CO2 GLUCOSE 260*  258* 285* 223*  223*  BUN CREATININE 2.27*  2.27* 2.19* 2.25*  2.25*  CALCIUM 8.3*  8.4* 8.3* 8.6*  8.5*  MG 2.4  --  2.6*  PHOS 1.8* 1.6* 1.7*  1.6*   Liver Function Tests  Recent Labs  03/25/16 1600 03/26/16 0400  ALBUMIN 2.6* 2.7*   No results for input(s): LIPASE, AMYLASE in the last 72 hours. Cardiac Enzymes No results for input(s): CKTOTAL, CKMB, CKMBINDEX, TROPONINI in the last 72 hours.  BNP: BNP (last 3 results)  Recent Labs  03/14/16 1457 03/20/16 1227  BNP 1509.6* 1489.8*  ProBNP (last 3 results) No results for input(s): PROBNP in the last 8760 hours.   D-Dimer No results for input(s): DDIMER in the last 72 hours. Hemoglobin A1C No results for input(s): HGBA1C in the last 72 hours. Fasting Lipid Panel No results for input(s): CHOL, HDL, LDLCALC, TRIG, CHOLHDL, LDLDIRECT in the last 72 hours. Thyroid Function Tests No results for input(s): TSH, T4TOTAL, T3FREE, THYROIDAB in the last 72 hours.  Invalid input(s): FREET3  Other results:     Imaging/Studies:  No results found.  Latest Echo 03/21/16  - Left ventricle: The cavity size was mildly dilated. Wall  thickness was normal. Systolic function was moderately reduced.  The estimated ejection fraction was in the range of 35% to 40%.  Diffuse hypokinesis. There is  akinesis of the inferolateral and  inferior myocardium. Features are consistent with a pseudonormal  left ventricular filling pattern, with concomitant abnormal  relaxation and increased filling pressure (grade 2 diastolic  dysfunction). - Ascending aorta: The ascending aorta was mildly dilated. - Mitral valve: Calcified annulus. Mildly thickened leaflets .  There was mild regurgitation. - Left atrium: The atrium was mildly dilated. - Right ventricle: The cavity size was moderately dilated. - Right atrium: The atrium was moderately dilated. - Atrial septum: The septum bowed from right to left, consistent  with increased right atrial pressure. - Tricuspid valve: There was severe regurgitation. - Pulmonary arteries: PA peak pressure: 38 mm Hg (S). - Pericardium, extracardiac: A small pericardial effusion was  identified.  Impressions:  - Global hypokinesis with inferior and inferior lateral akinesis;  overall moderately reduced LV function; grade 2 diastolic  dysfunction; mild LAE; moderate RAE/RVE; small pericardial  effusion; mild MR; severe TR.  Latest Cath unnkown   Medications:     Scheduled Medications: . antiseptic oral rinse  7 mL Mouth Rinse BID  . atorvastatin  10 mg Oral Daily  . budesonide (PULMICORT) nebulizer solution  0.25 mg Nebulization BID  . famotidine  20 mg Oral Daily  . feeding supplement (GLUCERNA SHAKE)  237 mL Oral BID BM  . hydrocortisone sod succinate (SOLU-CORTEF) inj  50 mg Intravenous Q6H  . insulin aspart  0-20 Units Subcutaneous TID WC  . insulin aspart  0-5 Units Subcutaneous QHS  . multivitamin with minerals  1 tablet Oral Daily  . sodium chloride flush  10-40 mL Intracatheter Q12H  . sodium chloride flush  10-40 mL Intracatheter Q12H  . sodium chloride flush  3 mL Intravenous Q12H  . vancomycin  250 mg Oral QID  . Warfarin - Pharmacist Dosing Inpatient   Does not apply q1800    Infusions: . norepinephrine (LEVOPHED) Adult  infusion Stopped (03/24/16 2308)    PRN Medications: acetaminophen **OR** acetaminophen, albuterol, diphenhydrAMINE, HYDROcodone-acetaminophen, LORazepam, ondansetron **OR** ondansetron (ZOFRAN) IV, sodium chloride flush, sodium chloride flush   Assessment/Plan   Brandy Dudley is a 65 y.o. admitted from [facility] to IM service per notes for further evaluation for C. Diff colitis,  Acute CHF exacerbation, and ARF on CKD stage III. HF team consulted to assist with fluid management.   1. Acute on chronic systolic CHF: Ischemic cardiomyopathy with history of prior CAD (details not available). She has an ICD, thinks Environmental manager. Echo 03/21/16 35-40% with Grade 2 DD, mod dilated RV, mild LAE, mod RAE, Severe TR, Peak PA 38 mm Hg. She remains markedly volume overload with JVD and anasarca. This is complicated by C difficile colitis and and AKI on CKD with creatinine up to 4.46 this  admission.  Weight up at least 30 lbs by records. Now on CVVH, norepinephrine at low dose previously used to facilitate volume removal. Off norepi.  Good UF overnight, weight down considerably but CVP still elevated.    - Hold coreg for now with soft BP on CVVH.  - Continue fluid removal via CVVH with low dose norepinephrine support to facilitate. - -17 liters.  2. AKI on CKD:  - Now on CVVH. Dr. Imogene Burn [facility] consult - She presented with C difficile diarrhea but did not appear dehydrated.   3. R IJ DVT in 3/17: on Coumadin, INR on admit 6.38. INR 2.3 today.  She was initially having blood mixed in her diarrhea, this has resolved and hemoglobin is up. Restarted coumadin, aim for INR 2-2.5. However, Dr. Imogene Burn, Vascular, will need INR <1.3 for [facility] placement. Stop coumadin. Heparin IV when INR < 2.   4. CAD: History not available. On ASA + Plavix in the past. This was held due to hematuria at last admission (foley trauma) and need to use warfarin. Continue atorvastatin.    5. C difficile colitis: She was on  clindamycin recently for MRSA breast abscess, suspect this led to predisposition.Now on po vancomycin.  Diarrhea slowing, no longer bloody.    6. Anemia: Had blood in stool, likely related to C difficile colitis and high INR.  This seems to have resolved and hemoglobin is higher.  Restarted warfarin, aiming for INR 2-2.5 (2.08 today).  She has recent right IJ DVT.   Length of Stay: 6  [place] Dalporto  03/26/2016, 8:21 AM

## 2016-03-27 ENCOUNTER — Inpatient Hospital Stay (HOSPITAL_COMMUNITY): Payer: Medicare PPO

## 2016-03-27 DIAGNOSIS — R601 Generalized edema: Secondary | ICD-10-CM | POA: Insufficient documentation

## 2016-03-27 DIAGNOSIS — N19 Unspecified kidney failure: Secondary | ICD-10-CM

## 2016-03-27 LAB — PROTIME-INR
INR: 2.36 — ABNORMAL HIGH (ref 0.00–1.49)
Prothrombin Time: 25.5 seconds — ABNORMAL HIGH (ref 11.6–15.2)

## 2016-03-27 LAB — RENAL FUNCTION PANEL
ANION GAP: 8 (ref 5–15)
Albumin: 2.7 g/dL — ABNORMAL LOW (ref 3.5–5.0)
BUN: 35 mg/dL — ABNORMAL HIGH (ref 6–20)
CALCIUM: 8.5 mg/dL — AB (ref 8.9–10.3)
CHLORIDE: 104 mmol/L (ref 101–111)
CO2: 26 mmol/L (ref 22–32)
Creatinine, Ser: 3.49 mg/dL — ABNORMAL HIGH (ref 0.44–1.00)
GFR, EST AFRICAN AMERICAN: 15 mL/min — AB (ref >60)
GFR, EST NON AFRICAN AMERICAN: 13 mL/min — AB (ref >60)
Glucose, Bld: 227 mg/dL — ABNORMAL HIGH (ref 65–99)
Phosphorus: 1.8 mg/dL — ABNORMAL LOW (ref 2.5–4.6)
Potassium: 3.4 mmol/L — ABNORMAL LOW (ref 3.5–5.1)
Sodium: 138 mmol/L (ref 135–145)

## 2016-03-27 LAB — CBC
HCT: 27 % — ABNORMAL LOW (ref 36.0–46.0)
HEMOGLOBIN: 8.6 g/dL — AB (ref 12.0–15.0)
MCH: 28.2 pg (ref 26.0–34.0)
MCHC: 31.9 g/dL (ref 30.0–36.0)
MCV: 88.5 fL (ref 78.0–100.0)
PLATELETS: 153 10*3/uL (ref 150–400)
RBC: 3.05 MIL/uL — AB (ref 3.87–5.11)
RDW: 18.4 % — ABNORMAL HIGH (ref 11.5–15.5)
WBC: 17.8 10*3/uL — AB (ref 4.0–10.5)

## 2016-03-27 LAB — GLUCOSE, CAPILLARY
GLUCOSE-CAPILLARY: 297 mg/dL — AB (ref 65–99)
GLUCOSE-CAPILLARY: 192 mg/dL — AB (ref 65–99)
GLUCOSE-CAPILLARY: 161 mg/dL — AB (ref 65–99)

## 2016-03-27 MED ORDER — VITAMIN K1 10 MG/ML IJ SOLN
5.0000 mg | Freq: Once | INTRAVENOUS | Status: AC
  Administered 2016-03-27: 5 mg via INTRAVENOUS
  Filled 2016-03-27: qty 0.5

## 2016-03-27 MED ORDER — HYDROCORTISONE 1 % EX CREA
TOPICAL_CREAM | Freq: Every day | CUTANEOUS | Status: DC | PRN
  Administered 2016-03-28: 22:00:00 via TOPICAL
  Filled 2016-03-27 (×2): qty 28

## 2016-03-27 MED ORDER — POTASSIUM PHOSPHATES 15 MMOLE/5ML IV SOLN: 30.0000 mmol | Freq: Once | INTRAVENOUS | Status: DC

## 2016-03-27 MED ORDER — SODIUM PHOSPHATES 45 MMOLE/15ML IV SOLN
30.0000 mmol | Freq: Once | INTRAVENOUS | Status: AC
  Administered 2016-03-27: 30 mmol via INTRAVENOUS
  Filled 2016-03-27: qty 10

## 2016-03-27 NOTE — Progress Notes (Signed)
ANTICOAGULATION CONSULT NOTE - Follow Up Consult Pharmacy Consult for Coumadin Indication: DVT  Allergies  Allergen Reactions  . Entresto [Sacubitril-Valsartan] Other (See Comments)    On MAR    Patient Measurements: Height:  (162.6 cm) Weight: 246 lb 4.1 oz (111.7 kg) IBW/kg (Calculated) : 54.7  Vital Signs: Temp: 98 F (36.7 C) (05/01 0800) Temp Source: Oral (05/01 0800) BP: 114/76 mmHg (05/01 0800) Pulse Rate: 64 (05/01 0800)  Labs:  Recent Labs  03/25/16 0500 03/25/16 1600 03/26/16 0400 03/27/16 0430  HGB 8.5*  --  8.4* 8.6*  HCT 27.0*  --  26.0* 27.0*  PLT 147*  --  151 153  APTT 45*  --  46*  --   LABPROT 23.1*  --  25.1* 25.5*  INR 2.06*  --  2.30* 2.36*  CREATININE 2.27*  2.27* 2.19* 2.25*  2.25* 3.49*    Estimated Creatinine Clearance: 19.7 mL/min (by C-G formula based on Cr of 3.49).   Medical History: Past Medical History  Diagnosis Date  . Coronary artery disease   . CHF (congestive heart failure) (HCC)   . COPD (chronic obstructive pulmonary disease) (HCC)   . Hypertension     Medications:  Prescriptions prior to admission  Medication Sig Dispense Refill Last Dose  . acetaminophen (TYLENOL) 325 MG tablet Take 650 mg by mouth every 6 (six) hours as needed for mild pain.   Past Week at Unknown time  . albuterol (PROVENTIL HFA;VENTOLIN HFA) 108 (90 Base) MCG/ACT inhaler Inhale 2 puffs into the lungs every 6 (six) hours as needed for wheezing or shortness of breath.    rescue at rescue  . atorvastatin (LIPITOR) 10 MG tablet Take 10 mg by mouth daily.   03/19/2016 at Unknown time  . beclomethasone (QVAR) 80 MCG/ACT inhaler Inhale 2 puffs into the lungs 2 (two) times daily.   03/19/2016 at Unknown time  . carvedilol (COREG) 12.5 MG tablet Take 12.5 mg by mouth 2 (two) times daily.   03/19/2016 at 2107  . insulin glargine (LANTUS) 100 UNIT/ML injection Inject 18 Units into the skin at bedtime.   03/19/2016 at Unknown time  . insulin lispro  (HUMALOG) 100 UNIT/ML injection Inject 5 Units into the skin 3 (three) times daily with meals. For CBG > 180   03/19/2016 at Unknown time  . LORazepam (ATIVAN) 0.5 MG tablet Take 0.5 mg by mouth 2 (two) times daily. Stop date 03/20/16   03/19/2016 at unk  . omeprazole (PRILOSEC) 20 MG capsule Take 1 capsule (20 mg total) by mouth daily. 30 capsule 0 03/20/2016 at Unknown time  . torsemide (DEMADEX) 100 MG tablet Take 20-100 mg by mouth See admin instructions. Per MAR, pt given  in am,   in afternoon, and  in evening on 4/23   unk at unk  . warfarin (COUMADIN) 2 MG tablet Take 2 mg by mouth daily. Take along with 2.5 mg= 4.5 mg daily   03/19/2016 at 2107  . warfarin (COUMADIN) 2.5 MG tablet Take 2.5 mg by mouth daily. Take along with 2 mg= 4.5 mg daily   03/19/2016 at 2107  . Inulin (FIBER CHOICE) 1.5 g CHEW Chew 2 tablets by mouth daily as needed (for constipation).    unk at unk    Assessment: 65 y.o. female with admitted with CHF exacerbation/ARF/C diff colitis, recent R IJ DVT, INR reversed with FFP for central line and HD cath placement then restarted. INR 2.3 last dose 4/29.  Plan now to reverse INR  for HD perm cath placement.  Heparin when INR < 2  PTA Coumadin regimen: 4.5 mg daily  Goal of Therapy:  INR 2-3 Monitor platelets by anticoagulation protocol: Yes   Plan:  Monitor INR and start heparin when INR < 2 Daily INR, CBC   Leota Sauers Pharm.D. CPP, BCPS Clinical Pharmacist [phone] 03/27/2016 10:41 AM

## 2016-03-27 NOTE — Progress Notes (Signed)
   Daily Progress Note  Lab Results  Component Value Date   INR 2.36* 03/27/2016   INR 2.30* 03/26/2016   INR 2.06* 03/25/2016   - will roll TDC placement until Wednesday - please reverse anticoagulation before then  Leonides Sake, MD [facility] Office: [phone] Pager: [phone]  03/27/2016, 7:38 AM

## 2016-03-27 NOTE — Progress Notes (Signed)
Inpatient Diabetes Program Recommendations  AACE/ADA: New Consensus Statement on Inpatient Glycemic Control (2015)  Target Ranges:  Prepandial:   less than 140 mg/dL      Peak postprandial:   less than 180 mg/dL (1-2 hours)      Critically ill patients:  140 - 180 mg/dL  Results for Brandy Dudley, Brandy Dudley (MRN [id number]) as of 03/27/2016 10:04  Ref. Range 03/26/2016 07:34 03/26/2016 11:52 03/26/2016 16:22 03/26/2016 21:26 03/27/2016 07:18  Glucose-Capillary Latest Ref Range: 65-99 mg/dL 536 (H) 144 (H) 315 (H) 297 (H) 192 (H)   Review of Glycemic Control  Diabetes history: DM2 Outpatient Diabetes medications: Lantus 18 QHS, Humalog 5 units TID with meals Current orders for Inpatient glycemic control: Novolog 0-20 units TID with meals, Novolog 0-5 units QHS  Inpatient Diabetes Program Recommendations: Insulin - Basal: Please consider ordering Lantus 9 units QHS (half of home dose). Insulin - Meal Coverage: Please consider ordering Novolog 4 units TID with meals for meal coverage if patient eats at least 50% of meals.  Thanks, Orlando Penner, RN, MSN, CDE Diabetes Coordinator Inpatient Diabetes Program [phone] (Team Pager from 8am to 5pm) [phone] ([facility] office) [phone] [facility] office) [phone] [facility] office)

## 2016-03-27 NOTE — Progress Notes (Signed)
Patient ID: Brandy Dudley, female   DOB: [birth year], 65 y.o.   MRN: [id number]     Advanced Heart Failure Rounding Note  Referring Physician: Dr. Glade Lloyd PCP: Not in system Primary Cardiologist: Previously in [place]  Subjective:    Started on CVVH 4/26, stopped 4/30.  Weight down considerably but CVP remains > 20.  Still having diarrhea.  She is off norepinephrine.   Feels ok.    Objective:   Weight Range: 246 lb 4.1 oz (111.7 kg) Body mass index is 42.25 kg/(m^2).   Vital Signs:   Temp:  [97.6 F (36.4 C)-98.3 F (36.8 C)] 98 F (36.7 C) (05/01 0400) Pulse Rate:  [63-73] 64 (05/01 0600) Resp:  [15-25] 22 (05/01 0600) BP: (82-127)/(58-85) 104/66 mmHg (05/01 0600) SpO2:  [94 %-99 %] 98 % (05/01 0600) Weight:  [246 lb 4.1 oz (111.7 kg)] 246 lb 4.1 oz (111.7 kg) (05/01 0430) Last BM Date: 03/26/16  Weight change: Filed Weights   03/25/16 0405 03/26/16 0400 03/27/16 0430  Weight: 262 lb 5.6 oz (119 kg) 248 lb 10.9 oz (112.8 kg) 246 lb 4.1 oz (111.7 kg)    Intake/Output:   Intake/Output Summary (Last 24 hours) at 03/27/16 0725 Last data filed at 03/26/16 2100  Gross per 24 hour  Intake    525 ml  Output    252 ml  Net    273 ml     Physical Exam: General: Appears older than stated age, NAD. on CVVH HEENT: normal Neck: supple. JVP 12+. Carotids 2+ bilat; no bruits. No thyromegaly or nodule noted Cor: PMI nondisplaced. RRR. + TR/MR murmur Lungs: Scattered crackles. Abdomen: Obese, tight, umbilical hernia, non-tender, mild/mod distention, no HSM. +BS  Extremities: no cyanosis, clubbing, rash. 2+ edema up to flanks.  Neuro: Alert/orientedx3, cranial nerves grossly intact. moves all 4 extremities w/o difficulty. Affect pleasant  Telemetry: NSR 70s  Labs: CBC  Recent Labs  03/26/16 0400 03/27/16 0430  WBC 15.9* 17.8*  HGB 8.4* 8.6*  HCT 26.0* 27.0*  MCV 88.4 88.5  PLT 151 153   Basic Metabolic Panel  Recent Labs  03/25/16 0500  03/26/16 0400  03/27/16 0430  NA 140  140  < > 139  138 138  K 3.9  3.9  < > 3.5  3.5 3.4*  CL 104  104  < > 104  104 104  CO2 26  26  < > GLUCOSE 260*  258*  < > 223*  223* 227*  BUN 20  20  < > 15  15 35*  CREATININE 2.27*  2.27*  < > 2.25*  2.25* 3.49*  CALCIUM 8.3*  8.4*  < > 8.6*  8.5* 8.5*  MG 2.4  --  2.6*  --   PHOS 1.8*  < > 1.7*  1.6* 1.8*  < > = values in this interval not displayed. Liver Function Tests  Recent Labs  03/26/16 0400 03/27/16 0430  ALBUMIN 2.7* 2.7*   No results for input(s): LIPASE, AMYLASE in the last 72 hours. Cardiac Enzymes No results for input(s): CKTOTAL, CKMB, CKMBINDEX, TROPONINI in the last 72 hours.  BNP: BNP (last 3 results)  Recent Labs  03/14/16 1457 03/20/16 1227  BNP 1509.6* 1489.8*    ProBNP (last 3 results) No results for input(s): PROBNP in the last 8760 hours.   D-Dimer No results for input(s): DDIMER in the last 72 hours. Hemoglobin A1C No results for input(s): HGBA1C in the last 72 hours. Fasting Lipid  Panel No results for input(s): CHOL, HDL, LDLCALC, TRIG, CHOLHDL, LDLDIRECT in the last 72 hours. Thyroid Function Tests No results for input(s): TSH, T4TOTAL, T3FREE, THYROIDAB in the last 72 hours.  Invalid input(s): FREET3  Other results:     Imaging/Studies:  No results found.  Latest Echo  Latest Cath   Medications:     Scheduled Medications: . antiseptic oral rinse  7 mL Mouth Rinse BID  . atorvastatin  10 mg Oral Daily  . budesonide (PULMICORT) nebulizer solution  0.5 mg Nebulization BID  . famotidine  20 mg Oral Daily  . feeding supplement (GLUCERNA SHAKE)  237 mL Oral BID BM  . insulin aspart  0-20 Units Subcutaneous TID WC  . insulin aspart  0-5 Units Subcutaneous QHS  . multivitamin with minerals  1 tablet Oral Daily  . sodium chloride flush  10-40 mL Intracatheter Q12H  . vancomycin  250 mg Oral QID    Infusions:    PRN Medications: acetaminophen **OR**  acetaminophen, albuterol, diphenhydrAMINE, LORazepam, ondansetron **OR** ondansetron (ZOFRAN) IV, sodium chloride flush   Assessment/Plan   Sangita Kubiak is a 65 y.o. admitted from [place] place to IM service per notes for further evaluation for C. Diff colitis,  Acute CHF exacerbation, and ARF on CKD stage III. HF team consulted to assist with fluid management.   1. Acute on chronic systolic CHF: Ischemic cardiomyopathy with history of prior CAD (details not available). She has an ICD, thinks Environmental manager. Echo 03/21/16 35-40% with Grade 2 DD, mod dilated RV, mild LAE, mod RAE, Severe TR, Peak PA 38 mm Hg. She had considerable weight off with CVVH but is still volume overloaded with CVP > 20.  Off norepinephrine now that she is off CVVH. - She will have HD today for ongoing fluid removal.  - If BP remains stable, hopefully can start back on low dose Coreg.  2. AKI on CKD: Now requiring RRT, will have HD today.   3. R IJ DVT in 3/17: INR 2.36, need 1.3 for dialysis catheter placement.  Will cover with heparin when INR < 2.  4. CAD: History not available. On ASA + Plavix in the past. This was held due to hematuria at last admission (foley trauma) and need to use warfarin. Continue atorvastatin.   5. C difficile colitis: She was on clindamycin recently for MRSA breast abscess, suspect this led to predisposition.Now on po vancomycin.  Diarrhea has slowed, no longer bloody.   6. Anemia: Had blood in stool, likely related to C difficile colitis and high INR.  This seems to have resolved and hemoglobin is higher.    Length of Stay: 7  Zainab Crumrine Shirlee Latch  03/27/2016, 7:25 AM  Advanced Heart Failure Team Pager [phone] (M-F; 7a - 4p)  Please contact [facility] for night-coverage after hours (4p -7a ) and weekends on [email or web address]

## 2016-03-27 NOTE — Progress Notes (Signed)
PULMONARY / CRITICAL CARE MEDICINE   Name: Brandy Dudley MRN: [id number] DOB: 05/21/1951    ADMISSION DATE:  03/20/2016 CONSULTATION DATE:  03/20/2016  REFERRING MD:  Dr. 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 Diff colitis and acute respiratory distress/AoCHF. She was brought from [facility]. She was recently hospitalized from 3/15 to 4/05 in [place], requiring HD at that time. She has had minimal urine output since admission depite being on lasix gtt and metolazone. Co-ox has been adequate at 71% suggesting she is not in a low output state. Remains markedly volume overloaded. Progressive worsening renal function since admission up to Cr of 4.48 today. Nephrology was consulted today who recommends CVVHD initiation. PCCM called for HD cath placement and transfer to the ICU for further care.   SUBJECTIVE: CRRT stopped am 4/30.  No acute events.  Pt reports feeling much better  VITAL SIGNS: BP 114/76 mmHg  Pulse 64  Temp(Src) 98 F (36.7 C) (Oral)  Resp 21  Ht 5\' 4"  (1.626 m)  Wt 111.7 kg (246 lb 4.1 oz)  BMI 42.25 kg/m2  SpO2 100%  HEMODYNAMICS: CVP:  [22 mmHg-26 mmHg] 25 mmHg  VENTILATOR SETTINGS:    INTAKE / OUTPUT: I/O last 3 completed shifts: In: 605 [P.O.:475; I.V.:130] Out: 3972 [Urine:25; Other:3947]  PHYSICAL EXAMINATION: General:  Chronically ill appearing obese female, lying in bed, in NAD remains fluid overloaded. Neuro: AAOx4, no focal deficits. HEENT: PERRLA, O2 via [place] Cardiovascular:   RRR, systolic murmur  Lungs: even/non-labored, lungs bilaterally clear, diminished lower  Abdomen: Obese, soft, nontender,  BS+ Skin: warm, dry, improved BLE edema    LABS:  BMET  Recent Labs Lab 03/25/16 1600 03/26/16 0400 03/27/16 0430  NA 136 139  138  138  K 3.6 3.5  3.5 3.4*  CL 103 104  104 104  CO2 27 27  27 26   BUN 18 15  15  35*  CREATININE 2.19* 2.25*  2.25* 3.49*  GLUCOSE 285* 223*  223* 227*   Electrolytes  Recent Labs Lab 03/24/16 0415  03/25/16 0500 03/25/16 1600 03/26/16 0400 03/27/16 0430  CALCIUM 8.0*  < > 8.3*  8.4* 8.3* 8.6*  8.5* 8.5*  MG 2.4  --  2.4  --  2.6*  --   PHOS 2.1*  < > 1.8* 1.6* 1.7*  1.6* 1.8*  < > = values in this interval not displayed.  CBC  Recent Labs Lab 03/25/16 0500 03/26/16 0400 03/27/16 0430  WBC 15.0* 15.9* 17.8*  HGB 8.5* 8.4* 8.6*  HCT 27.0* 26.0* 27.0*  PLT 147* 151 153    Coag's  Recent Labs Lab 03/24/16 0415 03/25/16 0500 03/26/16 0400 03/27/16 0430  APTT 47* 45* 46*  --   INR 2.08* 2.06* 2.30* 2.36*    Sepsis Markers  Recent Labs Lab 03/23/16 1200  LATICACIDVEN 1.0    ABG No results for input(s): PHART, PCO2ART, PO2ART in the last 168 hours.  Liver Enzymes  Recent Labs Lab 03/21/16 0807  03/25/16 1600 03/26/16 0400 03/27/16 0430  AST 22  --   --   --   --   ALT 11*  --   --   --   --   ALKPHOS 110  --   --   --   --  BILITOT 1.5*  --   --   --   --   ALBUMIN 2.1*  < > 2.6* 2.7* 2.7*  < > = values in this interval not displayed.  Cardiac Enzymes No results for input(s): TROPONINI, PROBNP in the last 168 hours.  Glucose  Recent Labs Lab 03/25/16 2142 03/26/16 0734 03/26/16 1152 03/26/16 1622 03/26/16 2126 03/27/16 0718  GLUCAP 241* 178* 259* 275* 297* 192*    Imaging No results found.   STUDIES:  4/26 Renal US: No hydronephrosis, bilateral increased echogenicity 4/27 pCXR>> cardiomegaly and vascular congestion  CULTURES: 4/25 Urine Culture >> Multiple species  4/25 C. Diff >> Ag+ Toxin -  ANTIBIOTICS: 4/24 Vancomycin PO >>  SIGNIFICANT EVENTS: 4/24  admitted for C. Diff colitis and acute respiratory distress/AoCHF 4/26  tx to ICU for CVVHD due to AKI/CKD 2/2 to decompensated biventricular CHF with volume  overload developing hypotension.  4/30  CVVHD stopped   LINES/TUBES: 4/25 LIJ CVL >> 4/26 R fem HD cath >>  I reviewed CXR myself, TLC in place.  DISCUSSION: 65 year old female with CHF (EF 30-35%, grade 2 DD), failed diuresis.  Now with worsening oliguric renal failure / probable cardiorenal syndrome.  Seen by nephrology who requests transfer to ICU for initiation of CVVHD.  ASSESSMENT / PLAN:  CARDIOVASCULAR A:  Probable cardiorenal syndrome. Hx combined CHF (echo from 03/21/16 with EF 35-40%, grade 2DD), ICM, HTN, CAD, RIJ DVT (on warfarin). P:  CVVHD discontinued 4/30, now to IHD today afternoon. Continue outpatient atorvastatin. Hold outpatient carvedilol, torsemide. Hold warfarin for HD catheter placement, will give 5 mg of vitamin D today and recheck INR in AM.  RENAL A:  AoCKD - now with worsening oliguric renal failure. Hypokalemia>> resolved, mild Hyperkalemia P:  Nephrology following Replace electrolytes as indicated BMP daily Vit K and INR check in AM for vascular to place a perm-cath.  PULMONARY A: Acute hypoxic respiratory failure - due to CHF / volume overload. Hx COPD. PAH by echo - PAP 38 on echo from 03/21/16. P:  Continue supplemental O2 as needed to maintain SpO2 > 92% Continue BD's Intermittent CXR  GASTROINTESTINAL A:  Nutrition. C-diff Bloody Anus noted - coagulapthy, cdiff >> bleeding resolved P:  Heart healthy diet as tolerated Continue pepcid Oral vanc   HEMATOLOGIC A:  Supratherapeutic INR - on warfarin due to RIJ DVT, cdiff >> resolved INR 2 Anemia - chronic. VTE Prophylaxis. P:  Hold warfarin until HD catheter placement. Monitor INR in AM. Vit K today. Transfuse for Hgb < 7 SCD's Trend CBC  INFECTIOUS A:  C.difficile colitis.  P:  Abx as above (PO vanco)  ENDOCRINE A:  DM. Relative AI (cortisol 18) P:  SSI  D/C Stress dose steroids, monitor BP closely   NEUROLOGIC A:   Anxiety Dementia  P:  Continue low dose lorazepam (home med)  Family updated: None at bedside Interdisciplinary Family Meeting v Palliative Care Meeting: Due by: 03/28/16.  Discussed with bedside RN and PT.  Alyson Reedy, M.D. [facility] Pulmonary/Critical Care Medicine. Pager: [phone]. After hours pager: [phone].

## 2016-03-27 NOTE — Progress Notes (Signed)
VASCULAR LAB PRELIMINARY  PRELIMINARY  PRELIMINARY  PRELIMINARY   NOTE:  Short segment of superficial thrombosis in the branch of the right cephalic vein that has an IV mid forearm.     BILATERAL UPPER EXT VEIN MAP  Right  Upper Extremity Vein Map    Cephalic  Segment Diameter Depth Comment  1. Axilla 3.33 mm 8.76 mm   2. Mid upper arm 3.93 mm 10.1 mm   3. Above AC 2.65 mm 4.38 mm   4. In AC 4.2 mm 3.06 mm   5. Below AC 2.56 mm 7.17 mm   6. Mid forearm 2.51 mm 6.21 mm Thrombus in a branch of the cepalic from IV insertion extending and few cm  7. Wrist 2.15 mm 5.39 mm    mm mm    mm mm    mm mm    Basilic  Segment Diameter Depth Comment  1. Axilla 4.79 mm 12.4 mm   2. Mid upper arm 3.75 mm 15.4 mm   3. Above AC 3.48 mm 14.6 mm   4. In AC 2.42 mm 3.2 mm   5. Below AC 2.37 mm 4.2 mm   6. Mid forearm 1.92 mm 3.24 mm   7. Wrist mm mm    mm mm    mm mm    mm mm    Left Upper Extremity Vein Map    Cephalic  Segment Diameter Depth Comment  1. Axilla 3.02 mm 11.9 mm   2. Mid upper arm 3.51 mm 10.2 mm   3. Above AC 3.51 mm 3.29 mm   4. In AC 3.88 mm 3.33 mm   5. Below AC 3.56 mm 5.29 mm   6. Mid forearm 2.56 mm 5.48 mm   7. Wrist 3.06 mm 3.52 mm    mm mm    mm mm    mm mm    Basilic  Segment Diameter Depth Comment  1. Axilla mm mm   2. Mid upper arm 3.98  mm 14 mm   3. Above AC 4.46 mm 13.4 mm   4. In AC 2.12 mm 3.98 mm   5. Below AC 2.55 mm 5.94 mm   6. Mid forearm 2.55 mm 5.78 mm   7. Wrist mm mm    mm mm    mm mm    mm mm     Haizel Gatchell, RVT 03/27/2016, 1:04 PM

## 2016-03-27 NOTE — Progress Notes (Signed)
Pt is a 65 y.o. yo female who was admitted on 03/20/2016 with C Diff colitis and Acute respiratory distress/ AoCHF   1. AKI/CKD in the setting of decompensated biventricular CHF: Off CVVHD. Will start IHD today. Cr 3.49 today. TDC Wed. Decide about need for long term management 2. Volume/ Pressure: BP stable. Off pressors. 7.6L off w/ CVVHD.Stikll vol xs. 3. Anemia: Hgb 8.4 4. H/o DVT - INR 2.3. Pharmacy following/ management per primary 5. GI bleed- no heparin with HD 6. CAD/iCM/CHF- cards managing. 7. DM2: SSI per primary 8. C Diff colitis- on Vanc PO per primary. 9. Hyperkalemia- resolved.   Subjective: Interval History: Improving  Objective: Vital signs in last 24 hours: Temp:  [97.8 F (36.6 C)-98.3 F (36.8 C)] 98 F (36.7 C) (05/01 0800) Pulse Rate:  [63-71] 65 (05/01 1000) Resp:  [15-25] 21 (05/01 1000) BP: (82-126)/(58-76) 114/75 mmHg (05/01 1000) SpO2:  [94 %-100 %] 99 % (05/01 1002) Weight:  [111.7 kg (246 lb 4.1 oz)] 111.7 kg (246 lb 4.1 oz) (05/01 0430) Weight change: -1.1 kg (-2 lb 6.8 oz)  Intake/Output from previous day: 04/30 0701 - 05/01 0700 In: 525 [P.O.:475; I.V.:50] Out: 252  Intake/Output this shift:    General appearance: alert and cooperative GI: soft, non-tender; bowel sounds normal; no masses,  no organomegaly Extremities: edema 1-+  Lab Results:  Recent Labs  03/26/16 0400 03/27/16 0430  WBC 15.9* 17.8*  HGB 8.4* 8.6*  HCT 26.0* 27.0*  PLT 151 153   BMET:  Recent Labs  03/26/16 0400 03/27/16 0430  NA 139  138 138  K 3.5  3.5 3.4*  CL 104  104 104  CO2 27  27 26   GLUCOSE 223*  223* 227*  BUN 15  15 35*  CREATININE 2.25*  2.25* 3.49*  CALCIUM 8.6*  8.5* 8.5*   No results for input(s): PTH in the last 72 hours. Iron Studies: No results for input(s): IRON, TIBC, TRANSFERRIN, FERRITIN in the last 72 hours. Studies/Results: No results found.  Scheduled: . antiseptic oral rinse  7 mL Mouth Rinse BID  . atorvastatin   10 mg Oral Daily  . budesonide (PULMICORT) nebulizer solution  0.5 mg Nebulization BID  . famotidine  20 mg Oral Daily  . feeding supplement (GLUCERNA SHAKE)  237 mL Oral BID BM  . insulin aspart  0-20 Units Subcutaneous TID WC  . insulin aspart  0-5 Units Subcutaneous QHS  . multivitamin with minerals  1 tablet Oral Daily  . phytonadione (VITAMIN K) IV  5 mg Intravenous Once  . sodium chloride flush  10-40 mL Intracatheter Q12H  . sodium phosphate  Dextrose 5% IVPB  30 mmol Intravenous Once  . vancomycin  250 mg Oral QID     LOS: 7 days   Abigayle Wilinski C 03/27/2016,10:58 AM

## 2016-03-27 NOTE — Progress Notes (Signed)
eLink Physician-Brief Progress Note Patient Name: Brandy Dudley DOB: January 06, 1951 MRN: [id number]   Date of Service  03/27/2016  HPI/Events of Note  Notified of hypokalemia 3.4. Patient transitioned off of CVVHD yesterday & plan to start intermittent HD today per nephrology note.  eICU Interventions  Continuing with nephrology plan. Holding on replacement.     Intervention Category Intermediate Interventions: Electrolyte abnormality - evaluation and management  Lawanda Cousins 03/27/2016, 6:23 AM

## 2016-03-27 NOTE — Progress Notes (Signed)
Physical Therapy Treatment Patient Details Name: Brandy Dudley MRN: [id number] DOB: 05/28/1951 Today's Date: 03/27/2016    History of Present Illness Pt is a 65 y/o F admitted on 03/20/16 for C diff and acute respiratory distress/CHF.  She was recently hospitalized from 3/15-4/05 in [place], requiring HD at that time and s/p ICD.  Pt's PMH includes CHF, COPD, DVT.    PT Comments    [name] is progressing modestly w/ therapeutic exercises in bed, tolerating light manual resistance.  Pt will benefit from continued skilled PT services to increase functional independence and safety.   Follow Up Recommendations  SNF;Supervision/Assistance - 24 hour     Equipment Recommendations  Other (comment) (TBD at next venue of care)    Recommendations for Other Services       Precautions / Restrictions Precautions Precautions: Fall Precaution Comments: Rt femoral HD non-tunneled catheter Restrictions Weight Bearing Restrictions: No    Mobility  Bed Mobility               General bed mobility comments: Deferred due to Rt femoral HD catheter  Transfers                 General transfer comment: Deferred due to Rt femoral HD catheter  Ambulation/Gait                 Stairs            Wheelchair Mobility    Modified Rankin (Stroke Patients Only)       Balance                                    Cognition Arousal/Alertness: Awake/alert Behavior During Therapy: WFL for tasks assessed/performed Overall Cognitive Status: Within Functional Limits for tasks assessed                      Exercises General Exercises - Upper Extremity Shoulder Flexion: AROM;10 reps;Both;Supine;Strengthening (w/ light manual resistance) Shoulder Extension: Strengthening;Both;10 reps;Supine (w/ light manual resistance) Elbow Flexion: Strengthening;Both;10 reps;Supine (w/ light manual resistance) Elbow Extension: Strengthening;Both;10 reps;Supine (w/ light  manual resistance) Digit Composite Flexion: AROM;Both;Supine;15 reps General Exercises - Lower Extremity Ankle Circles/Pumps: AROM;10 reps;Supine;Both Quad Sets: Strengthening;Both;5 reps;Supine Heel Slides: AAROM;Left;5 reps;Supine (w/ manual resistance w/ extension)    General Comments General comments (skin integrity, edema, etc.): HR remained in 60s and 70s.  BP at start of session 116/64 snf sy rnf og drddion 114/75.  SpO2 remained 99-100% on RA, light wheezing w/ therapeutic exercise      Pertinent Vitals/Pain Pain Assessment: No/denies pain    Home Living                      Prior Function            PT Goals (current goals can now be found in the care plan section) Acute Rehab PT Goals Patient Stated Goal: "I want to be able to walk", "I had a dream that I could walk" PT Goal Formulation: With patient Time For Goal Achievement: 04/06/16 Potential to Achieve Goals: Good Progress towards PT goals: Progressing toward goals    Frequency  Min 2X/week    PT Plan Current plan remains appropriate    Co-evaluation             End of Session   Activity Tolerance: Patient tolerated treatment well;Patient limited by fatigue Patient left: in  bed;with call [facility]/phone within reach;Other (comment);with SCD's reapplied (w/ respiratory at bedside)     Time: 9450-3888 PT Time Calculation (min) (ACUTE ONLY): 18 min  Charges:  $Therapeutic Exercise: 8-22 mins                    G Codes:      Encarnacion Chu PT, DPT  Pager: [phone] Phone: [phone] 03/27/2016, 10:16 AM

## 2016-03-27 NOTE — Progress Notes (Signed)
Per Vascular surgery pts INR needs to be <1.3 to receive a permanent HD catheter. Pt is receiving coumadin per cardiology. CCM ordered Vit K at this time to lower INR so pt can receive permanent HD catheter.

## 2016-03-28 ENCOUNTER — Encounter (HOSPITAL_COMMUNITY): Payer: Self-pay | Admitting: Certified Registered Nurse Anesthetist

## 2016-03-28 LAB — GLUCOSE, CAPILLARY
GLUCOSE-CAPILLARY: 168 mg/dL — AB (ref 65–99)
GLUCOSE-CAPILLARY: 193 mg/dL — AB (ref 65–99)
GLUCOSE-CAPILLARY: 298 mg/dL — AB (ref 65–99)
Glucose-Capillary: 159 mg/dL — ABNORMAL HIGH (ref 65–99)

## 2016-03-28 LAB — BASIC METABOLIC PANEL
ANION GAP: 9 (ref 5–15)
BUN: 32 mg/dL — ABNORMAL HIGH (ref 6–20)
CO2: 27 mmol/L (ref 22–32)
Calcium: 8.4 mg/dL — ABNORMAL LOW (ref 8.9–10.3)
Chloride: 102 mmol/L (ref 101–111)
Creatinine, Ser: 3.19 mg/dL — ABNORMAL HIGH (ref 0.44–1.00)
GFR calc non Af Amer: 14 mL/min — ABNORMAL LOW (ref >60)
GFR, EST AFRICAN AMERICAN: 16 mL/min — AB (ref >60)
Glucose, Bld: 176 mg/dL — ABNORMAL HIGH (ref 65–99)
POTASSIUM: 3.3 mmol/L — AB (ref 3.5–5.1)
SODIUM: 138 mmol/L (ref 135–145)

## 2016-03-28 LAB — RENAL FUNCTION PANEL
ALBUMIN: 2.6 g/dL — AB (ref 3.5–5.0)
ANION GAP: 9 (ref 5–15)
BUN: 32 mg/dL — ABNORMAL HIGH (ref 6–20)
CALCIUM: 8.4 mg/dL — AB (ref 8.9–10.3)
CO2: 27 mmol/L (ref 22–32)
Chloride: 102 mmol/L (ref 101–111)
Creatinine, Ser: 3.18 mg/dL — ABNORMAL HIGH (ref 0.44–1.00)
GFR calc non Af Amer: 14 mL/min — ABNORMAL LOW (ref >60)
GFR, EST AFRICAN AMERICAN: 17 mL/min — AB (ref >60)
Glucose, Bld: 179 mg/dL — ABNORMAL HIGH (ref 65–99)
Phosphorus: 2.7 mg/dL (ref 2.5–4.6)
Potassium: 3.3 mmol/L — ABNORMAL LOW (ref 3.5–5.1)
SODIUM: 138 mmol/L (ref 135–145)

## 2016-03-28 LAB — CBC
HCT: 25.7 % — ABNORMAL LOW (ref 36.0–46.0)
HEMOGLOBIN: 8.3 g/dL — AB (ref 12.0–15.0)
MCH: 27.8 pg (ref 26.0–34.0)
MCHC: 32.3 g/dL (ref 30.0–36.0)
MCV: 86 fL (ref 78.0–100.0)
Platelets: 129 10*3/uL — ABNORMAL LOW (ref 150–400)
RBC: 2.99 MIL/uL — AB (ref 3.87–5.11)
RDW: 18.2 % — ABNORMAL HIGH (ref 11.5–15.5)
WBC: 14.5 10*3/uL — AB (ref 4.0–10.5)

## 2016-03-28 LAB — HEPARIN LEVEL (UNFRACTIONATED): Heparin Unfractionated: 0.35 IU/mL (ref 0.30–0.70)

## 2016-03-28 LAB — PROTIME-INR
INR: 1.79 — ABNORMAL HIGH (ref 0.00–1.49)
Prothrombin Time: 20.8 seconds — ABNORMAL HIGH (ref 11.6–15.2)

## 2016-03-28 LAB — HEPATITIS B SURFACE ANTIBODY,QUALITATIVE: Hep B S Ab: NONREACTIVE

## 2016-03-28 LAB — HEPATITIS B SURFACE ANTIGEN: HEP B S AG: NEGATIVE

## 2016-03-28 LAB — MAGNESIUM: Magnesium: 2.3 mg/dL (ref 1.7–2.4)

## 2016-03-28 LAB — PHOSPHORUS: PHOSPHORUS: 2.7 mg/dL (ref 2.5–4.6)

## 2016-03-28 LAB — HEPATITIS B CORE ANTIBODY, TOTAL: HEP B C TOTAL AB: NEGATIVE

## 2016-03-28 MED ORDER — DEXTROSE 5 % IV SOLN
1.5000 g | INTRAVENOUS | Status: AC
  Filled 2016-03-28: qty 1.5

## 2016-03-28 MED ORDER — HEPARIN (PORCINE) IN NACL 100-0.45 UNIT/ML-% IJ SOLN
1900.0000 [IU]/h | INTRAMUSCULAR | Status: DC
  Administered 2016-03-28 – 2016-03-29 (×2): 1100 [IU]/h via INTRAVENOUS
  Administered 2016-03-30 – 2016-04-01 (×3): 1450 [IU]/h via INTRAVENOUS
  Administered 2016-04-02 – 2016-04-05 (×5): 1750 [IU]/h via INTRAVENOUS
  Administered 2016-04-06: 1900 [IU]/h via INTRAVENOUS
  Administered 2016-04-08: 2000 [IU]/h via INTRAVENOUS
  Administered 2016-04-09: 1900 [IU]/h via INTRAVENOUS
  Filled 2016-03-28 (×17): qty 250

## 2016-03-28 MED ORDER — CARVEDILOL 3.125 MG PO TABS
3.1250 mg | ORAL_TABLET | Freq: Two times a day (BID) | ORAL | Status: DC
  Administered 2016-03-28 – 2016-04-10 (×21): 3.125 mg via ORAL
  Filled 2016-03-28 (×21): qty 1

## 2016-03-28 MED ORDER — SODIUM CHLORIDE 0.9 % IV SOLN: 100.0000 mL | INTRAVENOUS | Status: DC | PRN

## 2016-03-28 MED ORDER — ALTEPLASE 2 MG IJ SOLR: 2.0000 mg | Freq: Once | INTRAMUSCULAR | Status: DC | PRN

## 2016-03-28 MED ORDER — PENTAFLUOROPROP-TETRAFLUOROETH EX AERO: 1.0000 "application " | INHALATION_SPRAY | CUTANEOUS | Status: DC | PRN

## 2016-03-28 MED ORDER — HEPARIN SODIUM (PORCINE) 1000 UNIT/ML DIALYSIS: 1000.0000 [IU] | INTRAMUSCULAR | Status: DC | PRN

## 2016-03-28 MED ORDER — LIDOCAINE-PRILOCAINE 2.5-2.5 % EX CREA
1.0000 "application " | TOPICAL_CREAM | CUTANEOUS | Status: DC | PRN
  Filled 2016-03-28: qty 5

## 2016-03-28 MED ORDER — NEPRO/CARBSTEADY PO LIQD
237.0000 mL | Freq: Two times a day (BID) | ORAL | Status: DC
  Filled 2016-03-28 (×3): qty 237

## 2016-03-28 MED ORDER — HEPARIN SODIUM (PORCINE) 1000 UNIT/ML DIALYSIS: 20.0000 [IU]/kg | INTRAMUSCULAR | Status: DC | PRN

## 2016-03-28 MED ORDER — NEPRO/CARBSTEADY PO LIQD
237.0000 mL | ORAL | Status: DC
  Filled 2016-03-28 (×4): qty 237

## 2016-03-28 MED ORDER — LIDOCAINE HCL (PF) 1 % IJ SOLN: 5.0000 mL | INTRAMUSCULAR | Status: DC | PRN

## 2016-03-28 MED ORDER — VITAMIN K1 10 MG/ML IJ SOLN
5.0000 mg | Freq: Once | INTRAVENOUS | Status: AC
  Administered 2016-03-28: 5 mg via INTRAVENOUS
  Filled 2016-03-28: qty 0.5

## 2016-03-28 NOTE — Progress Notes (Signed)
ANTICOAGULATION CONSULT NOTE - Follow Up Consult  Pharmacy Consult for Heparin Indication: hx DVT  Allergies  Allergen Reactions  . Entresto [Sacubitril-Valsartan] Other (See Comments)    On MAR    Patient Measurements: Height: 5\' 4"  (162.6 cm) Weight: 231 lb 14.8 oz (105.2 kg) IBW/kg (Calculated) : 54.7 Heparin Dosing Weight: 80kg  Vital Signs: Temp: 98.2 F (36.8 C) (05/02 2025) Temp Source: Oral (05/02 2025) BP: 105/54 mmHg (05/02 2025) Pulse Rate: 73 (05/02 1810)  Labs:  Recent Labs  03/26/16 0400 03/27/16 0430 03/28/16 0405 03/28/16 1912  HGB 8.4* 8.6* 8.3*  --   HCT 26.0* 27.0* 25.7*  --   PLT 151 153 129*  --   APTT 46*  --   --   --   LABPROT 25.1* 25.5* 20.8*  --   INR 2.30* 2.36* 1.79*  --   HEPARINUNFRC  --   --   --  0.35  CREATININE 2.25*  2.25* 3.49* 3.19*  3.18*  --     Estimated Creatinine Clearance: 20.9 mL/min (by C-G formula based on Cr of 3.18).  Assessment: 64yof on coumadin pta for recent RIJ DVT. INR on admit supratherapeutic - coumadin was held and reversed for central line and temp HD cath placement. Coumadin then resumed 4/28 - received 2 doses and then held again and reversed for permanent HD cath placement. Pharmacy asked to begin heparin once INR < 2. INR 1.79 today and IV heparin was initiated. Planning permanent HD cath placement once INR < 1.3. Hgb low but stable, platelets trending down.  Reversal agents given: 4/25: FFP 4 units 4/26: FFP 3 units 4/27: Vit K 2mg   5/1: Vit K 5mg   Initial heparin level is therapeutic at 0.35 on 1100 units/hr - note level was delayed due to dialysis so >8 hr level.   Goal of Therapy:  Heparin level 0.3-0.7 units/ml Monitor platelets by anticoagulation protocol: Yes   Plan:  1) Continue heparin at 1100 units/hr  2) Check 8 hour heparin level- ok to do with AM labs 3) Daily heparin level, CBC, INR  Link Snuffer, PharmD, BCPS Clinical Pharmacist [phone]  03/28/2016,8:32 PM

## 2016-03-28 NOTE — Progress Notes (Signed)
PULMONARY / CRITICAL CARE MEDICINE   Name: Brandy Dudley MRN: [id number] DOB: [birth year]    ADMISSION DATE:  03/20/2016 CONSULTATION DATE:  03/20/2016  REFERRING MD:  Dr. 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 Diff colitis and acute respiratory distress/AoCHF. She was brought from [facility]. She was recently hospitalized from 3/15 to 4/05 in [place], requiring HD at that time. She has had minimal urine output since admission depite being on lasix gtt and metolazone. Co-ox has been adequate at 71% suggesting she is not in a low output state. Remains markedly volume overloaded. Progressive worsening renal function since admission up to Cr of 4.48 today. Nephrology was consulted today who recommends CVVHD initiation. PCCM called for HD cath placement and transfer to the ICU for further care.   SUBJECTIVE: CRRT stopped am 4/30.  No acute events.  Pt reports feeling much better  VITAL SIGNS: BP 111/64 mmHg  Pulse 71  Temp(Src) 98 F (36.7 C) (Oral)  Resp 15  Ht  (1.626 m)  Wt 107.6 kg (237 lb 3.4 oz)  BMI 40.70 kg/m2  SpO2 97%  HEMODYNAMICS: CVP:  [21 mmHg-23 mmHg] 22 mmHg  VENTILATOR SETTINGS:    INTAKE / OUTPUT: I/O last 3 completed shifts: In: 650 [P.O.:340; IV Piggyback:310] Out: 3030 [Urine:30; Other:3000]  PHYSICAL EXAMINATION: General:  Chronically ill appearing obese female, lying in bed, in NAD remains fluid overloaded. Neuro: AAOx4, no focal deficits. HEENT: PERRLA, O2 via [place] Cardiovascular:   RRR, systolic murmur  Lungs: even/non-labored, lungs bilaterally clear, diminished lower  Abdomen: Obese, soft, nontender,  BS+ Skin: warm, dry, improved BLE edema    LABS:  BMET  Recent Labs Lab 03/26/16 0400 03/27/16 0430 03/28/16 0405  NA 139   138 138 138  138  K 3.5  3.5 3.4* 3.3*  3.3*  CL 104  104 104 102  102  CO2 BUN 15  15 35* 32*  32*  CREATININE 2.25*  2.25* 3.49* 3.19*  3.18*  GLUCOSE 223*  223* 227* 176*  179*   Electrolytes  Recent Labs Lab 03/25/16 0500  03/26/16 0400 03/27/16 0430 03/28/16 0405  CALCIUM 8.3*  8.4*  < > 8.6*  8.5* 8.5* 8.4*  8.4*  MG 2.4  --  2.6*  --  2.3  PHOS 1.8*  < > 1.7*  1.6* 1.8* 2.7  2.7  < > = values in this interval not displayed.  CBC  Recent Labs Lab 03/26/16 0400 03/27/16 0430 03/28/16 0405  WBC 15.9* 17.8* 14.5*  HGB 8.4* 8.6* 8.3*  HCT 26.0* 27.0* 25.7*  PLT 151 153 129*    Coag's  Recent Labs Lab 03/24/16 0415 03/25/16 0500 03/26/16 0400 03/27/16 0430 03/28/16 0405  APTT 47* 45* 46*  --   --   INR 2.08* 2.06* 2.30* 2.36* 1.79*    Sepsis Markers  Recent Labs Lab 03/23/16 1200  LATICACIDVEN 1.0    ABG No results for input(s): PHART, PCO2ART, PO2ART in the last 168 hours.  Liver Enzymes  Recent Labs Lab 03/26/16 0400 03/27/16 0430 03/28/16 0405  ALBUMIN 2.7* 2.7* 2.6*    Cardiac Enzymes No results for input(s): TROPONINI, PROBNP in the last 168 hours.  Glucose  Recent Labs Lab 03/26/16 1152 03/26/16 1622 03/26/16 2126 03/27/16 0718 03/27/16 1115 03/27/16 2146  GLUCAP 259* 275* 297* 192* 161* 168*    Imaging No results found.   STUDIES:  4/26 Renal US: No hydronephrosis, bilateral increased echogenicity 4/27 pCXR>> cardiomegaly and vascular congestion  CULTURES: 4/25 Urine Culture >> Multiple species  4/25 C. Diff >> Ag+ Toxin -  ANTIBIOTICS: 4/24 Vancomycin PO >>  SIGNIFICANT EVENTS: 4/24  admitted for C. Diff colitis and acute respiratory distress/AoCHF 4/26  tx to ICU for CVVHD due to AKI/CKD 2/2 to decompensated biventricular CHF with volume overload developing hypotension.  4/30  CVVHD stopped   LINES/TUBES: 4/25 LIJ CVL >> 4/26 R fem HD cath >>  I reviewed CXR myself,  TLC in place.  DISCUSSION: 65 year old female with CHF (EF 30-35%, grade 2 DD), failed diuresis.  Now with worsening oliguric renal failure / probable cardiorenal syndrome.  Seen by nephrology who requests transfer to ICU for initiation of CVVHD.  ASSESSMENT / PLAN:  CARDIOVASCULAR A:  Probable cardiorenal syndrome. Hx combined CHF (echo from 03/21/16 with EF 35-40%, grade 2DD), ICM, HTN, CAD, RIJ DVT (on warfarin). P:  Tolerated HD yesterday, to HD again today. Continue outpatient atorvastatin. Hold outpatient carvedilol, torsemide. Hold warfarin for HD catheter placement, will give 5 mg of vitamin D today and recheck INR in AM.  RENAL A:  AoCKD - now with worsening oliguric renal failure. Hypokalemia>> resolved, mild Hyperkalemia P:  Nephrology following Replace electrolytes as indicated BMP daily Vit K and INR check in AM for vascular to place a perm-cath.  PULMONARY A: Acute hypoxic respiratory failure - due to CHF / volume overload. Hx COPD. PAH by echo - PAP 38 on echo from 03/21/16. P:  Continue supplemental O2 as needed to maintain SpO2 > 92% Continue BD's Intermittent CXR  GASTROINTESTINAL A:  Nutrition. C-diff Bloody Anus noted - coagulapthy, cdiff >> bleeding resolved P:  Heart healthy diet as tolerated Continue pepcid Oral vanc   HEMATOLOGIC A:  Supratherapeutic INR - on warfarin due to RIJ DVT, cdiff >> resolved INR 2 Anemia - chronic. VTE Prophylaxis. P:  Hold warfarin until HD catheter placement. Monitor INR in AM. Vit K today. Transfuse for Hgb < 7 SCD's Trend CBC  INFECTIOUS A:  C.difficile colitis.  P:  Abx as above (PO vanco)  ENDOCRINE A:  DM. Relative AI (cortisol 18) P:  SSI  D/C Stress dose steroids, monitor BP closely   NEUROLOGIC A:  Anxiety Dementia  P:  Continue low dose lorazepam (home med)  Family updated: None at bedside Interdisciplinary Family Meeting v Palliative Care Meeting:  Due by: 03/28/16.  Discussed with bedside RN and TRH MD, transfer to SDU and to [facility] service with PCCM off 5/3.  Alyson Reedy, M.D. [facility] Pulmonary/Critical Care Medicine. Pager: [phone]. After hours pager: [phone].

## 2016-03-28 NOTE — Progress Notes (Signed)
Patient ID: Brandy Dudley, female   DOB: 05-Mar-1951, 65 y.o.   MRN: [id number]     Advanced Heart Failure Rounding Note  Referring Physician: Dr. Glade Lloyd PCP: Not in system Primary Cardiologist: Previously in [place]  Subjective:    Started on CVVH 4/26, stopped 4/30.  HD on 5/1.  Weight down considerably but CVP remains > 20.  One BM over night.   Denies SOB.     Objective:   Weight Range: 237 lb 3.4 oz (107.6 kg) Body mass index is 40.7 kg/(m^2).   Vital Signs:   Temp:  [97.3 F (36.3 C)-98 F (36.7 C)] 98 F (36.7 C) (05/02 0300) Pulse Rate:  [64-71] 71 (05/02 0600) Resp:  [15-24] 15 (05/02 0600) BP: (96-122)/(53-78) 111/64 mmHg (05/02 0600) SpO2:  [94 %-100 %] 96 % (05/02 0600) Weight:  [237 lb 3.4 oz (107.6 kg)-245 lb 9.5 oz (111.4 kg)] 237 lb 3.4 oz (107.6 kg) (05/02 0359) Last BM Date: 03/26/16  Weight change: Filed Weights   03/27/16 1422 03/27/16 1756 03/28/16 0359  Weight: 245 lb 9.5 oz (111.4 kg) 238 lb 8.6 oz (108.2 kg) 237 lb 3.4 oz (107.6 kg)    Intake/Output:   Intake/Output Summary (Last 24 hours) at 03/28/16 0723 Last data filed at 03/28/16 0300  Gross per 24 hour  Intake    550 ml  Output   3030 ml  Net  -2480 ml     Physical Exam: CVp 20  General: Appears older than stated age, NAD. on CVVH HEENT: normal Neck: supple. JVP jaw  Carotids 2+ bilat; no bruits. No thyromegaly or nodule noted Cor: PMI nondisplaced. RRR. + TR/MR murmur Lungs: Scattered crackles. Abdomen: Obese, tight, umbilical hernia, non-tender, mild/mod distention, no HSM. +BS  Extremities: no cyanosis, clubbing, rash. 2+ edema up to flanks.  Neuro: Alert/orientedx3, cranial nerves grossly intact. moves all 4 extremities w/o difficulty. Affect pleasant  Telemetry: NSR 70s  Labs: CBC  Recent Labs  03/27/16 0430 03/28/16 0405  WBC 17.8* 14.5*  HGB 8.6* 8.3*  HCT 27.0* 25.7*  MCV 88.5 86.0  PLT 153 129*   Basic Metabolic Panel  Recent Labs  03/26/16 0400  03/27/16 0430 03/28/16 0405  NA 139  138 138 138  138  K 3.5  3.5 3.4* 3.3*  3.3*  CL 104  104 104 102  102  CO2 27  27 26 27  27   GLUCOSE 223*  223* 227* 176*  179*  BUN 15  15 35* 32*  32*  CREATININE 2.25*  2.25* 3.49* 3.19*  3.18*  CALCIUM 8.6*  8.5* 8.5* 8.4*  8.4*  MG 2.6*  --  2.3  PHOS 1.7*  1.6* 1.8* 2.7  2.7   Liver Function Tests  Recent Labs  03/27/16 0430 03/28/16 0405  ALBUMIN 2.7* 2.6*   No results for input(s): LIPASE, AMYLASE in the last 72 hours. Cardiac Enzymes No results for input(s): CKTOTAL, CKMB, CKMBINDEX, TROPONINI in the last 72 hours.  BNP: BNP (last 3 results)  Recent Labs  03/14/16 1457 03/20/16 1227  BNP 1509.6* 1489.8*    ProBNP (last 3 results) No results for input(s): PROBNP in the last 8760 hours.   D-Dimer No results for input(s): DDIMER in the last 72 hours. Hemoglobin A1C No results for input(s): HGBA1C in the last 72 hours. Fasting Lipid Panel No results for input(s): CHOL, HDL, LDLCALC, TRIG, CHOLHDL, LDLDIRECT in the last 72 hours. Thyroid Function Tests No results for input(s): TSH, T4TOTAL, T3FREE, THYROIDAB in the last  72 hours.  Invalid input(s): FREET3  Other results:     Imaging/Studies:  No results found.  Latest Echo  Latest Cath   Medications:     Scheduled Medications: . antiseptic oral rinse  7 mL Mouth Rinse BID  . atorvastatin  10 mg Oral Daily  . budesonide (PULMICORT) nebulizer solution  0.5 mg Nebulization BID  . famotidine  20 mg Oral Daily  . feeding supplement (GLUCERNA SHAKE)  237 mL Oral BID BM  . insulin aspart  0-20 Units Subcutaneous TID WC  . insulin aspart  0-5 Units Subcutaneous QHS  . multivitamin with minerals  1 tablet Oral Daily  . sodium chloride flush  10-40 mL Intracatheter Q12H  . vancomycin  250 mg Oral QID    Infusions:    PRN Medications: acetaminophen **OR** acetaminophen, albuterol, diphenhydrAMINE, hydrocortisone cream, LORazepam,  ondansetron **OR** ondansetron (ZOFRAN) IV, sodium chloride flush   Assessment/Plan   Brandy Dudley is a 65 y.o. admitted from [place] [facility] to IM service per notes for further evaluation for C. Diff colitis,  Acute CHF exacerbation, and ARF on CKD stage III. HF team consulted to assist with fluid management.   1. Acute on chronic systolic CHF: Ischemic cardiomyopathy with history of prior CAD (details not available). She has an ICD, thinks Environmental manager. Echo 03/21/16 35-40% with Grade 2 DD, mod dilated RV, mild LAE, mod RAE, Severe TR, Peak PA 38 mm Hg. She had considerable weight off with CVVH.  Off norepinephrine now that she is off CVVH.  Had HD 5/1. CVP 22.   - Add back coreg 3.125 mg twice a day.  Hold SBP <85. - Think she would benefit from HD again today given ongoing volume overload, CVP remains > 20.  2. AKI on CKD: Now requiring RRT.   3. R IJ DVT in 3/17: INR 1.7 , need 1.3 for dialysis catheter placement.  Covering with heparin while INR < 2 and coumadin on hold.  4. CAD: History not available. On ASA + Plavix in the past. This was held due to hematuria at last admission (foley trauma) and need to use warfarin. Continue atorvastatin.   5. C difficile colitis: She was on clindamycin recently for MRSA breast abscess, suspect this led to predisposition.Now on po vancomycin.  Diarrhea has slowed, no longer bloody.   6. Anemia: Had blood in stool, likely related to C difficile colitis and high INR.  This seems to have resolved and hemoglobin is higher.    Length of Stay: 8  Amy Clegg NP-C  03/28/2016, 7:23 AM  Advanced Heart Failure Team Pager [phone] (M-F; 7a - 4p)  Please contact [facility] for night-coverage after hours (4p -7a ) and weekends on [email or web address]  Patient seen with NP, agree with the above note.   Start heparin gtt while INR < 2 given recent DVT.   Restart her on low dose Coreg 3.125 mg bid.   CVP remains > 20, would favor HD again today for volume  removal.   Awaiting HD catheter placement.  Suspect she will need long-term HD for volume management.  Marca Ancona 03/28/2016 7:36 AM

## 2016-03-28 NOTE — Progress Notes (Signed)
ANTICOAGULATION CONSULT NOTE - Follow Up Consult  Pharmacy Consult for Heparin Indication: hx DVT  Allergies  Allergen Reactions  . Entresto [Sacubitril-Valsartan] Other (See Comments)    On MAR    Patient Measurements: Height: 5\' 4"  (162.6 cm) Weight: 237 lb 3.4 oz (107.6 kg) IBW/kg (Calculated) : 54.7 Heparin Dosing Weight: 80kg  Vital Signs: Temp: 98 F (36.7 C) (05/02 0300) Temp Source: Oral (05/02 0300) BP: 111/64 mmHg (05/02 0600) Pulse Rate: 71 (05/02 0600)  Labs:  Recent Labs  03/26/16 0400 03/27/16 0430 03/28/16 0405  HGB 8.4* 8.6* 8.3*  HCT 26.0* 27.0* 25.7*  PLT 151 153 129*  APTT 46*  --   --   LABPROT 25.1* 25.5* 20.8*  INR 2.30* 2.36* 1.79*  CREATININE 2.25*  2.25* 3.49* 3.19*  3.18*    Estimated Creatinine Clearance: 21.1 mL/min (by C-G formula based on Cr of 3.18).  Assessment: 64yof on coumadin pta for recent RIJ DVT. INR on admit supratherapeutic - coumadin was held and reversed for central line and temp HD cath placement. Coumadin then resumed 4/28 - received 2 doses and then held again and reversed for permanent HD cath placement. Pharmacy asked to begin heparin once INR < 2. INR 1.79 today. Planning permanent HD cath placement once INR < 1.3. Hgb low but stable, platelets trending down.  Reversal agents given: 4/25: FFP 4 units 4/26: FFP 3 units 4/27: Vit K 2mg   5/1: Vit K 5mg   Goal of Therapy:  Heparin level 0.3-0.7 units/ml Monitor platelets by anticoagulation protocol: Yes   Plan:  1) Begin heparin at 1100 units/hr with no bolus 2) Check 8 hour heparin level 3) Daily heparin level, CBC, INR  Fredrik Rigger 03/28/2016,10:29 AM

## 2016-03-28 NOTE — Progress Notes (Signed)
Pt is a 65 y.o. yo female who was admitted on 03/20/2016 with C Diff colitis and Acute respiratory distress/ AoCHF   1. AKI/CKD in the setting of decompensated biventricular CHF: Off CVVHD. IHD initiated 5/1. Repeat HD today. Will contact Vascular surgery concerning perm cath and AV access. Order placed for vein mapping. Cr 3.18 today. TDC Wed.  2. Volume/ Pressure: BP stable. Off pressors. Volume overload still noted 3. Anemia: Hgb 8.3 4. H/o DVT - INR 1.79. Pharmacy following/ management per primary 5. GI bleed- no heparin with HD 6. CAD/iCM/CHF- cards managing. 7. DM2: SSI per primary 8. C Diff colitis- on Vanc PO per primary.  Subjective: Reports feeling much better. Agreeable to continuing dialysis.  Objective: Vital signs in last 24 hours: Temp:  [97.3 F (36.3 C)-98 F (36.7 C)] 97.8 F (36.6 C) (05/02 1200) Pulse Rate:  [64-77] 72 (05/02 1200) Resp:  [15-24] 18 (05/02 1200) BP: (96-122)/(53-78) 117/67 mmHg (05/02 1200) SpO2:  [94 %-100 %] 99 % (05/02 1200) Weight:  [237 lb 3.4 oz (107.6 kg)-245 lb 9.5 oz (111.4 kg)] 237 lb 3.4 oz (107.6 kg) (05/02 0359) Weight change: -10.6 oz (-0.3 kg)  Intake/Output from previous day: 05/01 0701 - 05/02 0700 In: 550 [P.O.:240; IV Piggyback:310] Out: 3030 [Urine:30] Intake/Output this shift: Total I/O In: 315.3 [P.O.:240; I.V.:25.3; IV Piggyback:50] Out: 30 [Urine:30]  General appearance: alert and cooperative GI: soft, non-tender; bowel sounds normal; no masses,  no organomegaly Extremities: edema 1-+  Lab Results:  Recent Labs  03/27/16 0430 03/28/16 0405  WBC 17.8* 14.5*  HGB 8.6* 8.3*  HCT 27.0* 25.7*  PLT 153 129*   BMET:   Recent Labs  03/27/16 0430 03/28/16 0405  NA 138 138  138  K 3.4* 3.3*  3.3*  CL 104 102  102  CO2 26 27  27   GLUCOSE 227* 176*  179*  BUN 35* 32*  32*  CREATININE 3.49* 3.19*  3.18*  CALCIUM 8.5* 8.4*  8.4*   No results for input(s): PTH in the last 72 hours. Iron Studies:  No results for input(s): IRON, TIBC, TRANSFERRIN, FERRITIN in the last 72 hours. Studies/Results: No results found.  Scheduled: . antiseptic oral rinse  7 mL Mouth Rinse BID  . atorvastatin  10 mg Oral Daily  . budesonide (PULMICORT) nebulizer solution  0.5 mg Nebulization BID  . carvedilol  3.125 mg Oral BID WC  . famotidine  20 mg Oral Daily  . feeding supplement (NEPRO CARB STEADY)  237 mL Oral Q24H  . insulin aspart  0-20 Units Subcutaneous TID WC  . insulin aspart  0-5 Units Subcutaneous QHS  . phytonadione (VITAMIN K) IV  5 mg Intravenous Once  . sodium chloride flush  10-40 mL Intracatheter Q12H  . vancomycin  250 mg Oral QID     LOS: 8 days   [facility] 03/28/2016,1:04 PM   Renal Attending: See detailed note above. She has CKD with recurrent CHF, and at this point likely has ESRD.  We will plan for AVF and PC placement for OP HD. Andelyn Spade C

## 2016-03-28 NOTE — Progress Notes (Signed)
Nutrition Follow-up  DOCUMENTATION CODES:   Morbid obesity  INTERVENTION:   D/C MVI Recommend Rena-vit  Nepro Shake po daily, each supplement provides 425 kcal and 19 grams protein (butter pecan in cup with lid)  NUTRITION DIAGNOSIS:   Increased nutrient needs related to  (hemodialysis) as evidenced by estimated needs. Ongoing.   GOAL:   Patient will meet greater than or equal to 90% of their needs Met.  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 Diff colitis and acute respiratory distress/AoCHF. Transferred to the ICU on 4/26 for CRRT.  4/25 C.diff positive, diarrhea improving now 4/30 CVVHD off 5/1 IHD, will need long term HD for fluid management, repeat IHD 5/2  Labs reviewed: potassium low 3.3- monitor trends CBG: 168 Weight has decreased 60 lb with fluid removal (18.9 L negative)  Pt eating well, 100% of meals.  Does not like Glucerna since it is served from a can.   Diet Order:  Diet renal with fluid restriction Fluid restriction:: 1200 mL Fluid; Room service appropriate?: Yes; Fluid consistency:: Thin  Skin:  Wound (see comment) (MASD perineum, sacrum)  Last BM:  4/30  Height:   Ht Readings from Last 1 Encounters:  03/26/16 5' 4" (1.626 m)    Weight:   Wt Readings from Last 1 Encounters:  03/28/16 237 lb 3.4 oz (107.6 kg)    Ideal Body Weight:  54.5 kg  BMI:  Body mass index is 40.7 kg/(m^2).  Estimated Nutritional Needs:   Kcal:  2100-2300  Protein:  100-115 grams  Fluid:  >/= 1.2 L/day  EDUCATION NEEDS:   No education needs identified at this time    RD, LDN, CNSC [phone] Pager [phone] After Hours Pager  

## 2016-03-29 ENCOUNTER — Encounter (HOSPITAL_COMMUNITY)
Admission: EM | Disposition: A | Discharge status: Skilled Nursing Facility | Payer: Self-pay | Source: Home / Self Care | Attending: Internal Medicine

## 2016-03-29 DIAGNOSIS — N186 End stage renal disease: Secondary | ICD-10-CM | POA: Insufficient documentation

## 2016-03-29 DIAGNOSIS — Z992 Dependence on renal dialysis: Secondary | ICD-10-CM

## 2016-03-29 LAB — BASIC METABOLIC PANEL
ANION GAP: 8 (ref 5–15)
BUN: 26 mg/dL — ABNORMAL HIGH (ref 6–20)
CALCIUM: 8 mg/dL — AB (ref 8.9–10.3)
CHLORIDE: 102 mmol/L (ref 101–111)
CO2: 27 mmol/L (ref 22–32)
CREATININE: 2.66 mg/dL — AB (ref 0.44–1.00)
GFR calc Af Amer: 21 mL/min — ABNORMAL LOW (ref >60)
GFR, EST NON AFRICAN AMERICAN: 18 mL/min — AB (ref >60)
GLUCOSE: 238 mg/dL — AB (ref 65–99)
POTASSIUM: 3.5 mmol/L (ref 3.5–5.1)
Sodium: 137 mmol/L (ref 135–145)

## 2016-03-29 LAB — CBC
HCT: 25.3 % — ABNORMAL LOW (ref 36.0–46.0)
HEMOGLOBIN: 8.1 g/dL — AB (ref 12.0–15.0)
MCH: 28.4 pg (ref 26.0–34.0)
MCHC: 32 g/dL (ref 30.0–36.0)
MCV: 88.8 fL (ref 78.0–100.0)
Platelets: 121 10*3/uL — ABNORMAL LOW (ref 150–400)
RBC: 2.85 MIL/uL — ABNORMAL LOW (ref 3.87–5.11)
RDW: 18.4 % — ABNORMAL HIGH (ref 11.5–15.5)
WBC: 11 10*3/uL — ABNORMAL HIGH (ref 4.0–10.5)

## 2016-03-29 LAB — GLUCOSE, CAPILLARY
GLUCOSE-CAPILLARY: 235 mg/dL — AB (ref 65–99)
GLUCOSE-CAPILLARY: 229 mg/dL — AB (ref 65–99)
GLUCOSE-CAPILLARY: 258 mg/dL — AB (ref 65–99)
Glucose-Capillary: 184 mg/dL — ABNORMAL HIGH (ref 65–99)

## 2016-03-29 LAB — RENAL FUNCTION PANEL
Albumin: 2.4 g/dL — ABNORMAL LOW (ref 3.5–5.0)
Anion gap: 6 (ref 5–15)
BUN: 26 mg/dL — AB (ref 6–20)
CHLORIDE: 104 mmol/L (ref 101–111)
CO2: 28 mmol/L (ref 22–32)
Calcium: 8 mg/dL — ABNORMAL LOW (ref 8.9–10.3)
Creatinine, Ser: 2.72 mg/dL — ABNORMAL HIGH (ref 0.44–1.00)
GFR calc Af Amer: 20 mL/min — ABNORMAL LOW (ref >60)
GFR calc non Af Amer: 17 mL/min — ABNORMAL LOW (ref >60)
GLUCOSE: 242 mg/dL — AB (ref 65–99)
POTASSIUM: 3.6 mmol/L (ref 3.5–5.1)
Phosphorus: 2.7 mg/dL (ref 2.5–4.6)
Sodium: 138 mmol/L (ref 135–145)

## 2016-03-29 LAB — MAGNESIUM: Magnesium: 2.2 mg/dL (ref 1.7–2.4)

## 2016-03-29 LAB — HEPARIN LEVEL (UNFRACTIONATED)
HEPARIN UNFRACTIONATED: 0.19 [IU]/mL — AB (ref 0.30–0.70)
HEPARIN UNFRACTIONATED: 0.29 [IU]/mL — AB (ref 0.30–0.70)

## 2016-03-29 LAB — PROTIME-INR
INR: 1.73 — ABNORMAL HIGH (ref 0.00–1.49)
PROTHROMBIN TIME: 20.2 s — AB (ref 11.6–15.2)

## 2016-03-29 LAB — PHOSPHORUS: Phosphorus: 2.7 mg/dL (ref 2.5–4.6)

## 2016-03-29 SURGERY — INSERTION OF DIALYSIS CATHETER
Anesthesia: Monitor Anesthesia Care | Site: Neck

## 2016-03-29 SURGERY — INSERTION OF DIALYSIS CATHETER
Anesthesia: Monitor Anesthesia Care

## 2016-03-29 MED ORDER — SODIUM CHLORIDE 0.9 % IV SOLN
Freq: Once | INTRAVENOUS | Status: AC
  Administered 2016-03-29: 21:00:00 via INTRAVENOUS

## 2016-03-29 MED ORDER — RENA-VITE PO TABS
1.0000 | ORAL_TABLET | Freq: Every day | ORAL | Status: DC
  Administered 2016-03-29 – 2016-04-09 (×12): 1 via ORAL
  Filled 2016-03-29 (×12): qty 1

## 2016-03-29 MED ORDER — INSULIN ASPART 100 UNIT/ML ~~LOC~~ SOLN
4.0000 [IU] | Freq: Three times a day (TID) | SUBCUTANEOUS | Status: DC
  Administered 2016-03-29 – 2016-04-09 (×26): 4 [IU] via SUBCUTANEOUS

## 2016-03-29 NOTE — Progress Notes (Signed)
Patient ID: Brandy Dudley, female   DOB: 1951-03-26, 65 y.o.   MRN: [id number]     Advanced Heart Failure Rounding Note  Referring Physician: Dr. Glade Lloyd PCP: Not in system Primary Cardiologist: Previously in [place]  Subjective:    Started on CVVH 4/26, stopped 4/30.  HD on 5/2. No diarrhea.   Denies SOB/orthopna.    Objective:   Weight Range: 231 lb 14.8 oz (105.2 kg) Body mass index is 39.79 kg/(m^2).   Vital Signs:   Temp:  [97.8 F (36.6 C)-98.3 F (36.8 C)] 98.2 F (36.8 C) (05/03 0318) Pulse Rate:  [65-77] 68 (05/03 0318) Resp:  [18-24] 21 (05/03 0318) BP: (82-122)/(45-69) 114/59 mmHg (05/03 0318) SpO2:  [95 %-100 %] 95 % (05/03 0318) Weight:  [231 lb 14.8 oz (105.2 kg)-238 lb 5.1 oz (108.1 kg)] 231 lb 14.8 oz (105.2 kg) (05/02 1810) Last BM Date: 03/28/16  Weight change: Filed Weights   03/28/16 0359 03/28/16 1440 03/28/16 1810  Weight: 237 lb 3.4 oz (107.6 kg) 238 lb 5.1 oz (108.1 kg) 231 lb 14.8 oz (105.2 kg)    Intake/Output:   Intake/Output Summary (Last 24 hours) at 03/29/16 0714 Last data filed at 03/29/16 0600  Gross per 24 hour  Intake  753.3 ml  Output   2311 ml  Net -1557.7 ml     Physical Exam: CVP 15  General: Appears older than stated age, NAD. In bed  HEENT: normal Neck: supple. JVP jaw  Carotids 2+ bilat; no bruits. No thyromegaly or nodule noted Cor: PMI nondisplaced. RRR. + TR/MR murmur Lungs: Decreased in the bases Abdomen: Obese, tight, umbilical hernia, non-tender, mild/mod distention, no HSM. +BS  Extremities: no cyanosis, clubbing, rash. R and LLE 1+ edema with SCDs. .  Neuro: Alert/orientedx3, cranial nerves grossly intact. moves all 4 extremities w/o difficulty. Affect pleasant  Telemetry: NSR 70s  Labs: CBC  Recent Labs  03/28/16 0405 03/29/16 0500  WBC 14.5* 11.0*  HGB 8.3* 8.1*  HCT 25.7* 25.3*  MCV 86.0 88.8  PLT 129* 121*   Basic Metabolic Panel  Recent Labs  03/28/16 0405 03/29/16 0410  NA 138  138  137  K 3.3*  3.3* 3.5  CL 102  102 102  CO2 GLUCOSE 176*  179* 238*  BUN 32*  32* 26*  CREATININE 3.19*  3.18* 2.66*  CALCIUM 8.4*  8.4* 8.0*  MG 2.3 2.2  PHOS 2.7  2.7 2.7   Liver Function Tests  Recent Labs  03/27/16 0430 03/28/16 0405  ALBUMIN 2.7* 2.6*   No results for input(s): LIPASE, AMYLASE in the last 72 hours. Cardiac Enzymes No results for input(s): CKTOTAL, CKMB, CKMBINDEX, TROPONINI in the last 72 hours.  BNP: BNP (last 3 results)  Recent Labs  03/14/16 1457 03/20/16 1227  BNP 1509.6* 1489.8*    ProBNP (last 3 results) No results for input(s): PROBNP in the last 8760 hours.   D-Dimer No results for input(s): DDIMER in the last 72 hours. Hemoglobin A1C No results for input(s): HGBA1C in the last 72 hours. Fasting Lipid Panel No results for input(s): CHOL, HDL, LDLCALC, TRIG, CHOLHDL, LDLDIRECT in the last 72 hours. Thyroid Function Tests No results for input(s): TSH, T4TOTAL, T3FREE, THYROIDAB in the last 72 hours.  Invalid input(s): FREET3  Other results:     Imaging/Studies:  No results found.  Latest Echo  Latest Cath   Medications:     Scheduled Medications: . antiseptic oral rinse  7 mL Mouth Rinse  BID  . atorvastatin  10 mg Oral Daily  . budesonide (PULMICORT) nebulizer solution  0.5 mg Nebulization BID  . carvedilol  3.125 mg Oral BID WC  . cefUROXime (ZINACEF)  IV  1.5 g Intravenous To SS-Surg  . famotidine  20 mg Oral Daily  . feeding supplement (NEPRO CARB STEADY)  237 mL Oral Q24H  . insulin aspart  0-20 Units Subcutaneous TID WC  . insulin aspart  0-5 Units Subcutaneous QHS  . sodium chloride flush  10-40 mL Intracatheter Q12H  . vancomycin  250 mg Oral QID    Infusions: . heparin 1,100 Units/hr (03/29/16 0401)    PRN Medications: acetaminophen **OR** acetaminophen, albuterol, diphenhydrAMINE, hydrocortisone cream, LORazepam, ondansetron **OR** ondansetron (ZOFRAN) IV, sodium chloride  flush   Assessment/Plan   Brandy Dudley is a 65 y.o. admitted from [place] [facility] to IM service per notes for further evaluation for C. Diff colitis,  Acute CHF exacerbation, and ARF on CKD stage III. HF team consulted to assist with fluid management.   1. Acute on chronic systolic CHF: Ischemic cardiomyopathy with history of prior CAD (details not available). She has an ICD, thinks Environmental manager. Echo 03/21/16 35-40% with Grade 2 DD, mod dilated RV, mild LAE, mod RAE, Severe TR, Peak PA 38 mm Hg. She had considerable weight off with CVVH.  Off norepinephrine now that she is off CVVH.  Had HD 5/1. CVP down to 15.   - Continue coreg 3.125 mg twice a day.  Hold SBP <85. 2. AKI on CKD: Now requiring RRT.   3. R IJ DVT in 3/17: INR 1.7 , need 1.3 for dialysis catheter placement.  Covering with heparin while INR < 2 and coumadin on hold.  4. CAD: History not available. On ASA + Plavix in the past. This was held due to hematuria at last admission (foley trauma) and need to use warfarin. Continue atorvastatin.   5. C difficile colitis: She was on clindamycin recently for MRSA breast abscess, suspect this led to predisposition.Now on po vancomycin.     6. Anemia: Had blood in stool, likely related to C difficile colitis and high INR.  This seems to have resolved and hemoglobin 8.1 today.  7. Immobility- PT following. Needs to get OOB  Length of Stay: 9  Amy Clegg NP-C  03/29/2016, 7:14 AM  Advanced Heart Failure Team Pager [phone] (M-F; 7a - 4p)  Please contact [facility] for night-coverage after hours (4p -7a ) and weekends on [email or web address]  Patient seen with NP, agree with the above note.  CVP coming down gradually with dialysis.  Awaiting permanent catheter placement.  Tolerating Coreg.  Needs PT.   We will follow at a distance, call with questions.   Marca Ancona 03/29/2016 8:05 AM

## 2016-03-29 NOTE — Progress Notes (Signed)
Inpatient Diabetes Program Recommendations  AACE/ADA: New Consensus Statement on Inpatient Glycemic Control (2015)  Target Ranges:  Prepandial:   less than 140 mg/dL      Peak postprandial:   less than 180 mg/dL (1-2 hours)      Critically ill patients:  140 - 180 mg/dL   Review of Glycemic Control Results for Brandy Dudley, Brandy Dudley (MRN [id number]) as of 03/29/2016 11:11  Ref. Range 03/28/2016 08:49 03/28/2016 12:17 03/28/2016 22:05 03/29/2016 07:37  Glucose-Capillary Latest Ref Range: 65-99 mg/dL 964 (H) 383 (H) 818 (H) 184 (H)   Diabetes history: DM2 Outpatient Diabetes medications: Lantus 18 QHS, Humalog 5 units TID with meals Current orders for Inpatient glycemic control: Novolog 0-20 units TID with meals, Novolog 0-5 units QHS  Inpatient Diabetes Program Recommendations: : Please consider ordering Lantus 10 units QHS. Please consider ordering Novolog 4 units TID with meals for meal coverage.  Thank you, [name]. Ina Scrivens, RN, MSN, CDE Inpatient Glycemic Control Team Team Pager [phone] (8am-5pm) 03/29/2016 11:12 AM

## 2016-03-29 NOTE — Progress Notes (Signed)
   Daily Progress Note  Lab Results  Component Value Date   INR 1.73* 03/29/2016   INR 1.79* 03/28/2016   INR 2.36* 03/27/2016   INR still too high.  Defer primary team reversal method.  Will roll case until tomorrow.  Leonides Sake, MD [facility] Office: [phone] Pager: [phone]  03/29/2016, 8:20 AM

## 2016-03-29 NOTE — Progress Notes (Signed)
ANTICOAGULATION CONSULT NOTE  Pharmacy Consult for Heparin Indication: h/o DVT  Allergies  Allergen Reactions  . Entresto [Sacubitril-Valsartan] Other (See Comments)    On MAR    Patient Measurements: Height: 5\' 4"  (162.6 cm) Weight: 231 lb 14.8 oz (105.2 kg) IBW/kg (Calculated) : 54.7 Heparin Dosing Weight: 80kg  Vital Signs: Temp: 98.2 F (36.8 C) (05/03 0318) Temp Source: Oral (05/03 0318) BP: 114/59 mmHg (05/03 0318) Pulse Rate: 68 (05/03 0318)  Labs:  Recent Labs  03/27/16 0430 03/28/16 0405 03/28/16 1912 03/29/16 0410 03/29/16 0500 03/29/16 0525  HGB 8.6* 8.3*  --   --  8.1*  --   HCT 27.0* 25.7*  --   --  25.3*  --   PLT 153 129*  --   --  121*  --   LABPROT 25.5* 20.8*  --   --  20.2*  --   INR 2.36* 1.79*  --   --  1.73*  --   HEPARINUNFRC  --   --  0.35  --   --  0.19*  CREATININE 3.49* 3.19*  3.18*  --  2.66*  --   --     Estimated Creatinine Clearance: 24.9 mL/min (by C-G formula based on Cr of 2.66).  Assessment: 65 y.o. female with h/o DVT, Coumadin on hold, for heparin Goal of Therapy:  Heparin level 0.3-0.7 units/ml Monitor platelets by anticoagulation protocol: Yes   Plan:  Increase Heparin 1350 units/hr Check heparin level in 8 hours.   Geannie Risen, PharmD, BCPS   03/29/2016,7:07 AM

## 2016-03-29 NOTE — Progress Notes (Signed)
ANTICOAGULATION CONSULT NOTE  Pharmacy Consult for Heparin Indication: h/o DVT  Allergies  Allergen Reactions  . Entresto [Sacubitril-Valsartan] Other (See Comments)    On MAR    Patient Measurements: Height: 5\' 4"  (162.6 cm) Weight: 231 lb 14.8 oz (105.2 kg) IBW/kg (Calculated) : 54.7 Heparin Dosing Weight: 80kg  Vital Signs: Temp: 98.3 F (36.8 C) (05/03 1617) Temp Source: Oral (05/03 1617) BP: 99/56 mmHg (05/03 1617) Pulse Rate: 66 (05/03 1617)  Labs:  Recent Labs  03/27/16 0430 03/28/16 0405 03/28/16 1912 03/29/16 0410 03/29/16 0500 03/29/16 0513 03/29/16 0525 03/29/16 1540  HGB 8.6* 8.3*  --   --  8.1*  --   --   --   HCT 27.0* 25.7*  --   --  25.3*  --   --   --   PLT 153 129*  --   --  121*  --   --   --   LABPROT 25.5* 20.8*  --   --  20.2*  --   --   --   INR 2.36* 1.79*  --   --  1.73*  --   --   --   HEPARINUNFRC  --   --  0.35  --   --   --  0.19* 0.29*  CREATININE 3.49* 3.19*  3.18*  --  2.66*  --  2.72*  --   --     Estimated Creatinine Clearance: 24.4 mL/min (by C-G formula based on Cr of 2.72).  Assessment: 64yof on coumadin pta for recent RIJ DVT. INR on admit supratherapeutic - coumadin was held and reversed for central line and temp HD cath placement. Coumadin then resumed 4/28 - received 2 doses and then held again and reversed for permanent HD cath placement.  Warfarin on hold. Heparin level 0.29 on 1350 units/hr. No issues with infusion per discussion with RN.   Goal of Therapy:  Heparin level 0.3-0.7 units/ml Monitor platelets by anticoagulation protocol: Yes   Plan:  1. Increase Heparin infusion to 1450 units/hr 2. HL in am 3. Daily HL and CBC  Pollyann Samples, PharmD, BCPS 03/29/2016, 5:37 PM Pager: [phone]

## 2016-03-29 NOTE — Progress Notes (Signed)
Pt is a 65 y.o. yo female who was admitted on 03/20/2016 with C Diff colitis and Acute respiratory distress/ AoCHF   1. AKI/CKD in the setting of decompensated biventricular CHF: Off CVVHD. IHD initiated 5/1 with last HD 5/2. Next scheduled for 5/4. Vascular surgery consulted concerning perm cath and AV access. Vascular surgery waiting for INR to be <1.3, currently 1.73. Vein mapping on 5/1. Cr 2.66 today, baseline 1.8-2. 2. Volume/ Pressure: BP stable. Off pressors. Holding Carvedilol and Torsemide. 3. Anemia/Thrombocytopenia: Hgb 8.1, Platelets 121 down from 165 at admission. On heparin drip per primary team. 4. H/o DVT - INR 1.73. Pharmacy following/ management per primary 5. GI bleed- Monitor closely. On heparin drip per primary team. 6. CAD/iCM/CHF- cards managing. 7. DM2: SSI per primary 8. C Diff colitis- on Vanc PO per primary.  Renal Attending: She is dialysis dependent with ESRD.  Will begin OP CLIP process.  Will need an AV access and TDC before discharge but prolonged INR currently problematic. Roniqua Kintz C   Subjective: Continues to feel much better. Notes improved respiration, but still notes mild shortness of breath.  Continues to tolerate HD well.  Objective: Vital signs in last 24 hours: Temp:  [97.8 F (36.6 C)-98.3 F (36.8 C)] 98.2 F (36.8 C) (05/03 0318) Pulse Rate:  [65-77] 68 (05/03 0318) Resp:  [18-24] 21 (05/03 0318) BP: (82-122)/(45-69) 114/59 mmHg (05/03 0318) SpO2:  [95 %-100 %] 95 % (05/03 0318) Weight:  [231 lb 14.8 oz (105.2 kg)-238 lb 5.1 oz (108.1 kg)] 231 lb 14.8 oz (105.2 kg) (05/02 1810) Weight change: -7 lb 4.4 oz (-3.3 kg)  Intake/Output from previous day: 05/02 0701 - 05/03 0700 In: 676.3 [P.O.:480; I.V.:146.3; IV Piggyback:50] Out: 2311 [Urine:30] Intake/Output this shift: Total I/O In: 164 [P.O.:120; I.V.:44] Out: -   General appearance: alert and cooperative GI: soft, non-tender; bowel sounds normal; no masses,  no  organomegaly Extremities: edema trace  Lab Results:  Recent Labs  03/28/16 0405 03/29/16 0500  WBC 14.5* 11.0*  HGB 8.3* 8.1*  HCT 25.7* 25.3*  PLT 129* 121*   BMET:   Recent Labs  03/28/16 0405 03/29/16 0410  NA 138  138 137  K 3.3*  3.3* 3.5  CL 102  102 102  CO2 27  27 27   GLUCOSE 176*  179* 238*  BUN 32*  32* 26*  CREATININE 3.19*  3.18* 2.66*  CALCIUM 8.4*  8.4* 8.0*   No results for input(s): PTH in the last 72 hours. Iron Studies: No results for input(s): IRON, TIBC, TRANSFERRIN, FERRITIN in the last 72 hours. Studies/Results: No results found.  Scheduled: . antiseptic oral rinse  7 mL Mouth Rinse BID  . atorvastatin  10 mg Oral Daily  . budesonide (PULMICORT) nebulizer solution  0.5 mg Nebulization BID  . carvedilol  3.125 mg Oral BID WC  . cefUROXime (ZINACEF)  IV  1.5 g Intravenous To SS-Surg  . famotidine  20 mg Oral Daily  . feeding supplement (NEPRO CARB STEADY)  237 mL Oral Q24H  . insulin aspart  0-20 Units Subcutaneous TID WC  . insulin aspart  0-5 Units Subcutaneous QHS  . sodium chloride flush  10-40 mL Intracatheter Q12H  . vancomycin  250 mg Oral QID     LOS: 9 days   [facility] 03/29/2016,6:36 AM

## 2016-03-29 NOTE — Care Management Important Message (Signed)
Important Message  Patient Details  Name: Brandy Dudley MRN: [id number] Date of Birth: [birth year]   Medicare Important Message Given:  Yes    Hanley Hays, RN 03/29/2016, 9:50 AM

## 2016-03-29 NOTE — Progress Notes (Signed)
PROGRESS NOTE    Brandy Dudley  IRC:[id number] DOB: 11-30-1950 DOA: 03/20/2016 PCP: PROVIDER NOT IN SYSTEM   Outpatient Specialists:     Brief Narrative:  Brandy Dudley is a 65 y.o. admitted from [place] place to IM service per notes for further evaluation for C. Diff colitis, Acute CHF exacerbation, and ARF on CKD stage III. HF team consulted to assist with fluid management.     Assessment & Plan:   Principal Problem:   Acute on chronic congestive heart failure (HCC) Active Problems:   Physical deconditioning   DVT (deep venous thrombosis), right   Chronic systolic congestive heart failure (HCC)   Cardiomyopathy, ischemic   Coronary artery disease involving native coronary artery of native heart without angina pectoris   Essential hypertension, malignant   Diabetes mellitus type 2 in obese (HCC)   C. difficile colitis   COPD (chronic obstructive pulmonary disease) (HCC)   Chest wall abscess   Obstructive sleep apnea   AICD (automatic cardioverter/defibrillator) present   C. difficile diarrhea   Chronic kidney disease (CKD), stage IV (severe) (HCC)   AKI (acute kidney injury) (HCC)   Respiratory failure (HCC)   Bloody diarrhea   Anasarca   C difficile colitis -on clindamycin recently for MRSA breast abscess - po vancomycin.  Acute on chronic systolic CHF: Ischemic cardiomyopathy with history of prior CAD  -Echo 03/21/16 35-40% with Grade 2 DD, mod dilated RV, mild LAE, mod RAE, Severe TR, Peak PA 38 mm Hg. She had considerable weight off with CVVH. Off norepinephrine now that she is off CVVH. Had HD 5/1 - Continue coreg 3.125 mg twice a day. Hold SBP <85. -appreciate HF team   AKI on CKD: Now requiring RRT -appreciate Dr. Lowell Guitar - giving FFP  Overnight to get INR in range (given vit K already)-- tentative plan for access in AM  R IJ DVT in 3/17: INR 1.7 , need 1.3 for dialysis catheter placement. Covering with heparin while INR < 2 and coumadin on hold.   CAD:  History not available. On ASA + Plavix in the past. This was held due to hematuria at last admission (foley trauma) and need to use warfarin. Continue atorvastatin.      Anemia: Had blood in stool, likely related to C difficile colitis and high INR. This seems to have resolved and hemoglobin 8.1 today.    Immobility- PT following -SNF eventually (from [facility])    DVT prophylaxis:  heparin  Code Status: Full Code   Family Communication: No family at bedside  Disposition Plan:  From [facility]-- back when medically ready-- not before next week I suspect   Consultants:   CHF team  PCCM  Procedures:      Subjective: SOB when up moving around  Objective: Filed Vitals:   03/28/16 2025 03/28/16 2339 03/29/16 0318 03/29/16 0745  BP: 105/54 98/52 114/59 116/62  Pulse:   68 69  Temp: 98.2 F (36.8 C) 98.3 F (36.8 C) 98.2 F (36.8 C) 98.1 F (36.7 C)  TempSrc: Oral Oral Oral Oral  Resp: 23 21 21 19   Height:      Weight:      SpO2: 99% 98% 95% 97%    Intake/Output Summary (Last 24 hours) at 03/29/16 0831 Last data filed at 03/29/16 0600  Gross per 24 hour  Intake  513.3 ml  Output   2311 ml  Net -1797.7 ml   Filed Weights   03/28/16 0359 03/28/16 1440 03/28/16 1810  Weight: 107.6  kg (237 lb 3.4 oz) 108.1 kg (238 lb 5.1 oz) 105.2 kg (231 lb 14.8 oz)    Examination:  General exam: Appears calm and comfortable  Respiratory system: Clear to auscultation. Respiratory effort normal. Cardiovascular system: S1 & S2 heard, RRR. +edema Gastrointestinal system: Abdomen is nondistended, soft and nontender. No organomegaly or masses felt. Normal bowel sounds heard. Central nervous system: Alert and oriented. No focal neurological deficits. Extremities: weak Skin: access in groin Psychiatry: Judgement and insight appear normal. Mood & affect appropriate.     Data Reviewed: I have personally reviewed following labs and imaging  studies  CBC:  Recent Labs Lab 03/25/16 0500 03/26/16 0400 03/27/16 0430 03/28/16 0405 03/29/16 0500  WBC 15.0* 15.9* 17.8* 14.5* 11.0*  HGB 8.5* 8.4* 8.6* 8.3* 8.1*  HCT 27.0* 26.0* 27.0* 25.7* 25.3*  MCV 89.1 88.4 88.5 86.0 88.8  PLT 147* 151 153 129* 121*   Basic Metabolic Panel:  Recent Labs Lab 03/24/16 0415  03/25/16 0500 03/25/16 1600 03/26/16 0400 03/27/16 0430 03/28/16 0405 03/29/16 0410  NA 139  < > 140  140 136 139  138 138 138  138 137  K 5.4*  < > 3.9  3.9 3.6 3.5  3.5 3.4* 3.3*  3.3* 3.5  CL 105  < > 104  104 103 104  104 104 102  102 102  CO2 25  < > GLUCOSE 149*  < > 260*  258* 285* 223*  223* 227* 176*  179* 238*  BUN 32*  < > 35* 32*  32* 26*  CREATININE 2.79*  < > 2.27*  2.27* 2.19* 2.25*  2.25* 3.49* 3.19*  3.18* 2.66*  CALCIUM 8.0*  < > 8.3*  8.4* 8.3* 8.6*  8.5* 8.5* 8.4*  8.4* 8.0*  MG 2.4  --  2.4  --  2.6*  --  2.3 2.2  PHOS 2.1*  < > 1.8* 1.6* 1.7*  1.6* 1.8* 2.7  2.7 2.7  < > = values in this interval not displayed. GFR: Estimated Creatinine Clearance: 24.9 mL/min (by C-G formula based on Cr of 2.66). Liver Function Tests:  Recent Labs Lab 03/25/16 0500 03/25/16 1600 03/26/16 0400 03/27/16 0430 03/28/16 0405  ALBUMIN 2.7* 2.6* 2.7* 2.7* 2.6*   No results for input(s): LIPASE, AMYLASE in the last 168 hours. No results for input(s): AMMONIA in the last 168 hours. Coagulation Profile:  Recent Labs Lab 03/25/16 0500 03/26/16 0400 03/27/16 0430 03/28/16 0405 03/29/16 0500  INR 2.06* 2.30* 2.36* 1.79* 1.73*   Cardiac Enzymes: No results for input(s): CKTOTAL, CKMB, CKMBINDEX, TROPONINI in the last 168 hours. BNP (last 3 results) No results for input(s): PROBNP in the last 8760 hours. HbA1C: No results for input(s): HGBA1C in the last 72 hours. CBG:  Recent Labs Lab 03/27/16 1115 03/27/16 2146 03/28/16 0849 03/28/16 1217 03/28/16 2205  GLUCAP  161* 168* 159* 193* 298*   Lipid Profile: No results for input(s): CHOL, HDL, LDLCALC, TRIG, CHOLHDL, LDLDIRECT in the last 72 hours. Thyroid Function Tests: No results for input(s): TSH, T4TOTAL, FREET4, T3FREE, THYROIDAB in the last 72 hours. Anemia Panel: No results for input(s): VITAMINB12, FOLATE, FERRITIN, TIBC, IRON, RETICCTPCT in the last 72 hours. Urine analysis:    Component Value Date/Time   COLORURINE AMBER* 03/14/2016 1655   APPEARANCEUR CLOUDY* 03/14/2016 1655   LABSPEC 1.019 03/14/2016 1655   PHURINE 5.0 03/14/2016 1655  GLUCOSEU NEGATIVE 03/14/2016 1655   HGBUR LARGE* 03/14/2016 1655   BILIRUBINUR SMALL* 03/14/2016 1655   KETONESUR 15* 03/14/2016 1655   PROTEINUR 30* 03/14/2016 1655   NITRITE NEGATIVE 03/14/2016 1655   LEUKOCYTESUR TRACE* 03/14/2016 1655    Recent Results (from the past 240 hour(s))  Urine culture     Status: Abnormal   Collection Time: 03/21/16  1:09 AM  Result Value Ref Range Status   Specimen Description URINE, CATHETERIZED  Final   Special Requests NONE  Final   Culture MULTIPLE SPECIES PRESENT, SUGGEST RECOLLECTION (A)  Final   Report Status 03/22/2016 FINAL  Final  MRSA PCR Screening     Status: None   Collection Time: 03/21/16  1:09 AM  Result Value Ref Range Status   MRSA by PCR NEGATIVE NEGATIVE Final    Comment:        The GeneXpert MRSA Assay (FDA approved for NASAL specimens only), is one component of a comprehensive MRSA colonization surveillance program. It is not intended to diagnose MRSA infection nor to guide or monitor treatment for MRSA infections.   MRSA PCR Screening     Status: None   Collection Time: 03/21/16  1:33 PM  Result Value Ref Range Status   MRSA by PCR NEGATIVE NEGATIVE Final    Comment:        The GeneXpert MRSA Assay (FDA approved for NASAL specimens only), is one component of a comprehensive MRSA colonization surveillance program. It is not intended to diagnose MRSA infection nor to guide  or monitor treatment for MRSA infections.   C difficile quick scan w PCR reflex     Status: Abnormal   Collection Time: 03/21/16  9:03 PM  Result Value Ref Range Status   C Diff antigen POSITIVE (A) NEGATIVE Final   C Diff toxin NEGATIVE NEGATIVE Final   C Diff interpretation   Final    C. difficile present, but toxin not detected. This indicates colonization. In most cases, this does not require treatment. If patient has signs and symptoms consistent with colitis, consider treatment. Requires ENTERIC precautions.      Anti-infectives    Start     Dose/Rate Route Frequency Ordered Stop   03/29/16 1127  cefUROXime (ZINACEF) 1.5 g in dextrose 5 % 50 mL IVPB     1.5 g 100 mL/hr over 30 Minutes Intravenous To [facility] 03/28/16 2305 03/30/16 1130   03/23/16 1400  vancomycin (VANCOCIN) 50 mg/mL oral solution 250 mg     250 mg Oral 4 times daily 03/23/16 1051 04/03/16 1759   03/20/16 1800  vancomycin (VANCOCIN) 50 mg/mL oral solution 125 mg  Status:  Discontinued     125 mg Oral 4 times daily 03/20/16 1637 03/23/16 1051   03/20/16 1230  metroNIDAZOLE (FLAGYL) IVPB 500 mg  Status:  Discontinued     500 mg 100 mL/hr over 60 Minutes Intravenous Every 8 hours 03/20/16 1218 03/20/16 1538       Radiology Studies: No results found.      Scheduled Meds: . sodium chloride   Intravenous Once  . antiseptic oral rinse  7 mL Mouth Rinse BID  . atorvastatin  10 mg Oral Daily  . budesonide (PULMICORT) nebulizer solution  0.5 mg Nebulization BID  . carvedilol  3.125 mg Oral BID WC  . cefUROXime (ZINACEF)  IV  1.5 g Intravenous To SS-Surg  . famotidine  20 mg Oral Daily  . feeding supplement (NEPRO CARB STEADY)  237 mL Oral Q24H  .  insulin aspart  0-20 Units Subcutaneous TID WC  . insulin aspart  0-5 Units Subcutaneous QHS  . sodium chloride flush  10-40 mL Intracatheter Q12H  . vancomycin  250 mg Oral QID   Continuous Infusions: . heparin 1,350 Units/hr (03/29/16 0727)      LOS: 9 days    Time spent: 35 min    Barrie Wale U Antwon Rochin, DO [facility] Pager [phone]  If 7PM-7AM, please contact night-coverage [email or web address] Password [email or web address] 03/29/2016, 8:31 AM

## 2016-03-30 LAB — RENAL FUNCTION PANEL
ALBUMIN: 2.7 g/dL — AB (ref 3.5–5.0)
ANION GAP: 8 (ref 5–15)
BUN: 34 mg/dL — AB (ref 6–20)
CO2: 27 mmol/L (ref 22–32)
Calcium: 8.7 mg/dL — ABNORMAL LOW (ref 8.9–10.3)
Chloride: 104 mmol/L (ref 101–111)
Creatinine, Ser: 2.85 mg/dL — ABNORMAL HIGH (ref 0.44–1.00)
GFR, EST AFRICAN AMERICAN: 19 mL/min — AB (ref >60)
GFR, EST NON AFRICAN AMERICAN: 16 mL/min — AB (ref >60)
Glucose, Bld: 156 mg/dL — ABNORMAL HIGH (ref 65–99)
PHOSPHORUS: 3.5 mg/dL (ref 2.5–4.6)
Potassium: 3.6 mmol/L (ref 3.5–5.1)
Sodium: 139 mmol/L (ref 135–145)

## 2016-03-30 LAB — CBC
HEMATOCRIT: 24.8 % — AB (ref 36.0–46.0)
HEMOGLOBIN: 7.8 g/dL — AB (ref 12.0–15.0)
MCH: 27.3 pg (ref 26.0–34.0)
MCHC: 31.5 g/dL (ref 30.0–36.0)
MCV: 86.7 fL (ref 78.0–100.0)
Platelets: 124 10*3/uL — ABNORMAL LOW (ref 150–400)
RBC: 2.86 MIL/uL — ABNORMAL LOW (ref 3.87–5.11)
RDW: 18.4 % — ABNORMAL HIGH (ref 11.5–15.5)
WBC: 8.3 10*3/uL (ref 4.0–10.5)

## 2016-03-30 LAB — GLUCOSE, CAPILLARY
GLUCOSE-CAPILLARY: 138 mg/dL — AB (ref 65–99)
GLUCOSE-CAPILLARY: 147 mg/dL — AB (ref 65–99)
GLUCOSE-CAPILLARY: 176 mg/dL — AB (ref 65–99)
Glucose-Capillary: 112 mg/dL — ABNORMAL HIGH (ref 65–99)

## 2016-03-30 LAB — HEPARIN LEVEL (UNFRACTIONATED): Heparin Unfractionated: 0.41 IU/mL (ref 0.30–0.70)

## 2016-03-30 LAB — PROTIME-INR
INR: 1.62 — ABNORMAL HIGH (ref 0.00–1.49)
Prothrombin Time: 19.3 seconds — ABNORMAL HIGH (ref 11.6–15.2)

## 2016-03-30 MED ORDER — HEPARIN SODIUM (PORCINE) 1000 UNIT/ML DIALYSIS: 20.0000 [IU]/kg | INTRAMUSCULAR | Status: DC | PRN

## 2016-03-30 MED ORDER — SODIUM CHLORIDE 0.9 % IV SOLN: 100.0000 mL | INTRAVENOUS | Status: DC | PRN

## 2016-03-30 MED ORDER — HEPARIN SODIUM (PORCINE) 1000 UNIT/ML DIALYSIS: 1000.0000 [IU] | INTRAMUSCULAR | Status: DC | PRN

## 2016-03-30 MED ORDER — LIDOCAINE-PRILOCAINE 2.5-2.5 % EX CREA
1.0000 "application " | TOPICAL_CREAM | CUTANEOUS | Status: DC | PRN
  Filled 2016-03-30: qty 5

## 2016-03-30 MED ORDER — PHYTONADIONE 5 MG PO TABS
5.0000 mg | ORAL_TABLET | Freq: Once | ORAL | Status: AC
  Administered 2016-03-30: 5 mg via ORAL
  Filled 2016-03-30: qty 1

## 2016-03-30 MED ORDER — ALTEPLASE 2 MG IJ SOLR: 2.0000 mg | Freq: Once | INTRAMUSCULAR | Status: DC | PRN

## 2016-03-30 MED ORDER — LIDOCAINE HCL (PF) 1 % IJ SOLN: 5.0000 mL | INTRAMUSCULAR | Status: DC | PRN

## 2016-03-30 MED ORDER — PENTAFLUOROPROP-TETRAFLUOROETH EX AERO: 1.0000 "application " | INHALATION_SPRAY | CUTANEOUS | Status: DC | PRN

## 2016-03-30 NOTE — Progress Notes (Signed)
Pt is a 65 y.o. yo female who was admitted on 03/20/2016 with C Diff colitis and Acute respiratory distress/ AoCHF   1. AKI/CKD in the setting of decompensated biventricular CHF: Off CVVHD. IHD initiated 5/1 with last HD 5/2, next scheduled for today. Vascular surgery consulted concerning perm cath and AV access. Vascular surgery waiting for INR to be <1.3, currently 1.62. Vein mapping on 5/1. Cr 2.85 today, baseline 1.8-2. Continue OP CLIP process. 2. Volume/ Pressure: BP stable. Off pressors. Holding Carvedilol and Torsemide. 3. Anemia/Thrombocytopenia: Hgb 7.8, Platelets 121 down from 165 at admission.  4. H/o DVT - INR 1.73. Pharmacy following/ management per primary 5. GI bleed- Monitor closely. 6. CAD/iCM/CHF- cards managing. 7. DM2: SSI per primary 8. C Diff colitis- on Vanc PO per primary.  Renal Attending: Stable hemodynamics.  Awaiting AV access and arrangements for OP HD> Aleia Larocca C   Subjective: Tolerating HD well. Agreeable to initiate CLIP process.  Objective: Vital signs in last 24 hours: Temp:  [97.8 F (36.6 C)-98.3 F (36.8 C)] 97.9 F (36.6 C) (05/04 1300) Pulse Rate:  [65-104] 73 (05/04 1300) Resp:  [15-23] 22 (05/04 1300) BP: (99-119)/(54-68) 100/62 mmHg (05/04 1300) SpO2:  [94 %-100 %] 98 % (05/04 1300) Weight:  [235 lb 7.2 oz (106.8 kg)-242 lb 15.2 oz (110.2 kg)] 235 lb 7.2 oz (106.8 kg) (05/04 1205) Weight change: 14.1 oz (0.4 kg)  Intake/Output from previous day: 05/03 0701 - 05/04 0700 In: 807.3 [I.V.:347.3; Blood:460] Out: 75 [Urine:75] Intake/Output this shift: Total I/O In: 87 [I.V.:87] Out: 2500 [Other:2500]  General appearance: alert and cooperative GI: soft, non-tender; bowel sounds normal; no masses,  no organomegaly Extremities: edema trace  Lab Results:  Recent Labs  03/29/16 0500 03/30/16 0700  WBC 11.0* 8.3  HGB 8.1* 7.8*  HCT 25.3* 24.8*  PLT 121* 124*   BMET:   Recent Labs  03/29/16 0513 03/30/16 0845  NA 138  139  K 3.6 3.6  CL 104 104  CO2 28 27  GLUCOSE 242* 156*  BUN 26* 34*  CREATININE 2.72* 2.85*  CALCIUM 8.0* 8.7*   No results for input(s): PTH in the last 72 hours. Iron Studies: No results for input(s): IRON, TIBC, TRANSFERRIN, FERRITIN in the last 72 hours. Studies/Results: No results found.  Scheduled: . antiseptic oral rinse  7 mL Mouth Rinse BID  . atorvastatin  10 mg Oral Daily  . budesonide (PULMICORT) nebulizer solution  0.5 mg Nebulization BID  . carvedilol  3.125 mg Oral BID WC  . famotidine  20 mg Oral Daily  . feeding supplement (NEPRO CARB STEADY)  237 mL Oral Q24H  . insulin aspart  0-20 Units Subcutaneous TID WC  . insulin aspart  0-5 Units Subcutaneous QHS  . insulin aspart  4 Units Subcutaneous TID WC  . multivitamin  1 tablet Oral QHS  . phytonadione  5 mg Oral Once  . sodium chloride flush  10-40 mL Intracatheter Q12H  . vancomycin  250 mg Oral QID     LOS: 10 days   [facility] 03/30/2016,1:27 PM

## 2016-03-30 NOTE — Progress Notes (Signed)
PT Cancellation Note  Patient Details Name: Brandy Dudley MRN: [id number] DOB: 10-Aug-1951   Cancelled Treatment:    Reason Eval/Treat Not Completed: Patient at procedure or test/unavailable. Pt currently at HD. Will attempt to return to see pt again later today, schedule permitting.  Encarnacion Chu PT, DPT  Pager: [phone] Phone: [phone] 03/30/2016, 9:09 AM

## 2016-03-30 NOTE — Progress Notes (Signed)
ANTICOAGULATION CONSULT NOTE - Follow Up Consult  Pharmacy Consult for Heparin Indication: hx DVT  Allergies  Allergen Reactions  . Entresto [Sacubitril-Valsartan] Other (See Comments)    On MAR    Patient Measurements: Height: 5\' 4"  (162.6 cm) Weight: 242 lb 15.2 oz (110.2 kg) IBW/kg (Calculated) : 54.7 Heparin Dosing Weight: 80kg  Vital Signs: Temp: 98.1 F (36.7 C) (05/04 0835) Temp Source: Oral (05/04 0835) BP: 101/56 mmHg (05/04 1030) Pulse Rate: 70 (05/04 1030)  Labs:  Recent Labs  03/28/16 0405  03/29/16 0410 03/29/16 0500 03/29/16 0513 03/29/16 0525 03/29/16 1540 03/30/16 0700 03/30/16 0845  HGB 8.3*  --   --  8.1*  --   --   --  7.8*  --   HCT 25.7*  --   --  25.3*  --   --   --  24.8*  --   PLT 129*  --   --  121*  --   --   --  124*  --   LABPROT 20.8*  --   --  20.2*  --   --   --  19.3*  --   INR 1.79*  --   --  1.73*  --   --   --  1.62*  --   HEPARINUNFRC  --   < >  --   --   --  0.19* 0.29* 0.41  --   CREATININE 3.19*  3.18*  --  2.66*  --  2.72*  --   --   --  2.85*  < > = values in this interval not displayed.  Estimated Creatinine Clearance: 23.9 mL/min (by C-G formula based on Cr of 2.85).  Medications: Heparin at 1450 units/hr  Assessment: 64yof on coumadin pta for recent RIJ DVT. INR on admit supratherapeutic - coumadin was held and reversed for central line and temp HD cath placement. Coumadin then resumed 4/28 - received 2 doses and then held again and reversed for permanent HD cath placement. Pharmacy asked to begin heparin once INR < 2.  Heparin started 5/2. Heparin level is therapeutic at 0.41. Planning permanent HD cath once INR < 1.3 - INR 1.6 today. Hgb trending down slowly - will watch closely.  Reversal agents given: 4/25: FFP 4 units 4/26: FFP 3 units 4/27: Vit K 2mg   5/1: Vit K 5mg   Goal of Therapy:  Heparin level 0.3-0.7 units/ml Monitor platelets by anticoagulation protocol: Yes   Plan:  1) Continue heparin at 1450  units/hr 2) Daily heparin level, INR, CBC  Fredrik Rigger 03/30/2016,11:16 AM

## 2016-03-30 NOTE — Progress Notes (Signed)
   Daily Progress Note  Waiting for INR<=1.3 for combined TDC exchange and L arm fistula placement (L RC vs BC AVF)  Lab Results  Component Value Date   INR 1.62* 03/30/2016   INR 1.73* 03/29/2016   INR 1.79* 03/28/2016    - will reschedule for tomorrow   Brian Chen, MD [facility] Office: [phone] Pager: [phone]  03/30/2016, 9:59 AM      

## 2016-03-30 NOTE — Progress Notes (Signed)
PROGRESS NOTE    Brandy Dudley  ZOX:[id number] DOB: [birth year] DOA: 03/20/2016 PCP: PROVIDER NOT IN SYSTEM   Outpatient Specialists:     Brief Narrative:  Brandy Dudley is a 65 y.o. admitted from [facility] to IM service per notes for further evaluation for C. Diff colitis, Acute CHF exacerbation, and ARF on CKD stage III. HF team consulted to assist with fluid management.     Assessment & Plan:   Principal Problem:   Acute on chronic congestive heart failure (HCC) Active Problems:   Physical deconditioning   DVT (deep venous thrombosis), right   Chronic systolic congestive heart failure (HCC)   Cardiomyopathy, ischemic   Coronary artery disease involving native coronary artery of native heart without angina pectoris   Essential hypertension, malignant   Diabetes mellitus type 2 in obese (HCC)   C. difficile colitis   COPD (chronic obstructive pulmonary disease) (HCC)   Chest wall abscess   Obstructive sleep apnea   AICD (automatic cardioverter/defibrillator) present   C. difficile diarrhea   Chronic kidney disease (CKD), stage IV (severe) (HCC)   AKI (acute kidney injury) (HCC)   Respiratory failure (HCC)   Bloody diarrhea   Anasarca   ESRD on dialysis (HCC)   C difficile colitis -on clindamycin recently for MRSA breast abscess - po vancomycin.  Acute on chronic systolic CHF: Ischemic cardiomyopathy with history of prior CAD  -Echo 03/21/16 35-40% with Grade 2 DD, mod dilated RV, mild LAE, mod RAE, Severe TR, Peak PA 38 mm Hg. She had considerable weight off with CVVH. Off norepinephrine now that she is off CVVH. Had HD 5/1 - Continue coreg 3.125 mg twice a day. Hold SBP <85. -appreciate HF team   AKI on CKD: Now requiring RRT -appreciate Dr. Lowell Guitar -s/p FFP  -will vit K again to goal of 1.3  R IJ DVT in 3/17: INR 1.7 , need 1.3 for dialysis catheter placement. Covering with heparin while INR < 2 and coumadin on hold.   CAD: History not available. On ASA +  Plavix in the past. This was held due to hematuria at last admission (foley trauma) and need to use warfarin. Continue atorvastatin.      Anemia: Had blood in stool, likely related to C difficile colitis and high INR. This seems to have resolved and hemoglobin 8.1 today.    Immobility- PT following -SNF eventually (from [facility]    DVT prophylaxis:  heparin  Code Status: Full Code   Family Communication: No family at bedside  Disposition Plan:  From [facility]-- back when medically ready-- not before next week I suspect   Consultants:   CHF team  PCCM  Procedures:      Subjective: Hungry, did not have breakfast  Objective: Filed Vitals:   03/30/16 1130 03/30/16 1159 03/30/16 1205 03/30/16 1300  BP: 117/54 113/60 119/61 100/62  Pulse: 77 72 73 73  Temp:   98.3 F (36.8 C) 97.9 F (36.6 C)  TempSrc:   Oral Oral  Resp:   21 22  Height:      Weight:   106.8 kg (235 lb 7.2 oz)   SpO2:   95% 98%    Intake/Output Summary (Last 24 hours) at 03/30/16 1422 Last data filed at 03/30/16 1300  Gross per 24 hour  Intake 1014.29 ml  Output   2575 ml  Net -1560.71 ml   Filed Weights   03/30/16 0430 03/30/16 0835 03/30/16 1205  Weight: 108.5 kg (239 lb 3.2 oz)  110.2 kg (242 lb 15.2 oz) 106.8 kg (235 lb 7.2 oz)    Examination:  General exam: Appears calm and comfortable  Respiratory system: Clear to auscultation. Respiratory effort normal. Cardiovascular system: S1 & S2 heard, RRR. +edema Gastrointestinal system: Abdomen is nondistended, soft and nontender. No organomegaly or masses felt. Normal bowel sounds heard. Central nervous system: Alert and oriented. No focal neurological deficits. Extremities: weak Skin: access in groin Psychiatry: Judgement and insight appear normal. Mood & affect appropriate.     Data Reviewed: I have personally reviewed following labs and imaging studies  CBC:  Recent Labs Lab 03/26/16 0400 03/27/16 0430  03/28/16 0405 03/29/16 0500 03/30/16 0700  WBC 15.9* 17.8* 14.5* 11.0* 8.3  HGB 8.4* 8.6* 8.3* 8.1* 7.8*  HCT 26.0* 27.0* 25.7* 25.3* 24.8*  MCV 88.4 88.5 86.0 88.8 86.7  PLT 151 153 129* 121* 124*   Basic Metabolic Panel:  Recent Labs Lab 03/24/16 0415  03/25/16 0500  03/26/16 0400 03/27/16 0430 03/28/16 0405 03/29/16 0410 03/29/16 0513 03/30/16 0845  NA 139  < > 140  140  < > 139  138 138 138  138 137 138 139  K 5.4*  < > 3.9  3.9  < > 3.5  3.5 3.4* 3.3*  3.3* 3.5 3.6 3.6  CL 105  < > 104  104  < > 104  104 104 102  102 102 104 104  CO2 25  < > 26  26  < > GLUCOSE 149*  < > 260*  258*  < > 223*  223* 227* 176*  179* 238* 242* 156*  BUN 32*  < > 20  20  < > 15  15 35* 32*  32* 26* 26* 34*  CREATININE 2.79*  < > 2.27*  2.27*  < > 2.25*  2.25* 3.49* 3.19*  3.18* 2.66* 2.72* 2.85*  CALCIUM 8.0*  < > 8.3*  8.4*  < > 8.6*  8.5* 8.5* 8.4*  8.4* 8.0* 8.0* 8.7*  MG 2.4  --  2.4  --  2.6*  --  2.3 2.2  --   --   PHOS 2.1*  < > 1.8*  < > 1.7*  1.6* 1.8* 2.7  2.7 2.7 2.7 3.5  < > = values in this interval not displayed. GFR: Estimated Creatinine Clearance: 23.5 mL/min (by C-G formula based on Cr of 2.85). Liver Function Tests:  Recent Labs Lab 03/26/16 0400 03/27/16 0430 03/28/16 0405 03/29/16 0513 03/30/16 0845  ALBUMIN 2.7* 2.7* 2.6* 2.4* 2.7*   No results for input(s): LIPASE, AMYLASE in the last 168 hours. No results for input(s): AMMONIA in the last 168 hours. Coagulation Profile:  Recent Labs Lab 03/26/16 0400 03/27/16 0430 03/28/16 0405 03/29/16 0500 03/30/16 0700  INR 2.30* 2.36* 1.79* 1.73* 1.62*   Cardiac Enzymes: No results for input(s): CKTOTAL, CKMB, CKMBINDEX, TROPONINI in the last 168 hours. BNP (last 3 results) No results for input(s): PROBNP in the last 8760 hours. HbA1C: No results for input(s): HGBA1C in the last 72 hours. CBG:  Recent Labs Lab 03/29/16 1117 03/29/16 1612 03/29/16 2131  03/30/16 0756 03/30/16 1301  GLUCAP 235* 229* 258* 138* 112*   Lipid Profile: No results for input(s): CHOL, HDL, LDLCALC, TRIG, CHOLHDL, LDLDIRECT in the last 72 hours. Thyroid Function Tests: No results for input(s): TSH, T4TOTAL, FREET4, T3FREE, THYROIDAB in the last 72 hours. Anemia Panel: No results for input(s): VITAMINB12, FOLATE, FERRITIN, TIBC,  IRON, RETICCTPCT in the last 72 hours. Urine analysis:    Component Value Date/Time   COLORURINE AMBER* 03/14/2016 1655   APPEARANCEUR CLOUDY* 03/14/2016 1655   LABSPEC 1.019 03/14/2016 1655   PHURINE 5.0 03/14/2016 1655   GLUCOSEU NEGATIVE 03/14/2016 1655   HGBUR LARGE* 03/14/2016 1655   BILIRUBINUR SMALL* 03/14/2016 1655   KETONESUR 15* 03/14/2016 1655   PROTEINUR 30* 03/14/2016 1655   NITRITE NEGATIVE 03/14/2016 1655   LEUKOCYTESUR TRACE* 03/14/2016 1655    Recent Results (from the past 240 hour(s))  Urine culture     Status: Abnormal   Collection Time: 03/21/16  1:09 AM  Result Value Ref Range Status   Specimen Description URINE, CATHETERIZED  Final   Special Requests NONE  Final   Culture MULTIPLE SPECIES PRESENT, SUGGEST RECOLLECTION (A)  Final   Report Status 03/22/2016 FINAL  Final  MRSA PCR Screening     Status: None   Collection Time: 03/21/16  1:09 AM  Result Value Ref Range Status   MRSA by PCR NEGATIVE NEGATIVE Final    Comment:        The GeneXpert MRSA Assay (FDA approved for NASAL specimens only), is one component of a comprehensive MRSA colonization surveillance program. It is not intended to diagnose MRSA infection nor to guide or monitor treatment for MRSA infections.   MRSA PCR Screening     Status: None   Collection Time: 03/21/16  1:33 PM  Result Value Ref Range Status   MRSA by PCR NEGATIVE NEGATIVE Final    Comment:        The GeneXpert MRSA Assay (FDA approved for NASAL specimens only), is one component of a comprehensive MRSA colonization surveillance program. It is not intended to  diagnose MRSA infection nor to guide or monitor treatment for MRSA infections.   C difficile quick scan w PCR reflex     Status: Abnormal   Collection Time: 03/21/16  9:03 PM  Result Value Ref Range Status   C Diff antigen POSITIVE (A) NEGATIVE Final   C Diff toxin NEGATIVE NEGATIVE Final   C Diff interpretation   Final    C. difficile present, but toxin not detected. This indicates colonization. In most cases, this does not require treatment. If patient has signs and symptoms consistent with colitis, consider treatment. Requires ENTERIC precautions.      Anti-infectives    Start     Dose/Rate Route Frequency Ordered Stop   03/29/16 1127  cefUROXime (ZINACEF) 1.5 g in dextrose 5 % 50 mL IVPB     1.5 g 100 mL/hr over 30 Minutes Intravenous To ShortStay Surgical 03/28/16 2305 03/30/16 1130   03/23/16 1400  vancomycin (VANCOCIN) 50 mg/mL oral solution 250 mg     250 mg Oral 4 times daily 03/23/16 1051 04/03/16 1759   03/20/16 1800  vancomycin (VANCOCIN) 50 mg/mL oral solution 125 mg  Status:  Discontinued     125 mg Oral 4 times daily 03/20/16 1637 03/23/16 1051   03/20/16 1230  metroNIDAZOLE (FLAGYL) IVPB 500 mg  Status:  Discontinued     500 mg 100 mL/hr over 60 Minutes Intravenous Every 8 hours 03/20/16 1218 03/20/16 1538       Radiology Studies: No results found.      Scheduled Meds: . antiseptic oral rinse  7 mL Mouth Rinse BID  . atorvastatin  10 mg Oral Daily  . budesonide (PULMICORT) nebulizer solution  0.5 mg Nebulization BID  . carvedilol  3.125 mg Oral BID WC  .  famotidine  20 mg Oral Daily  . feeding supplement (NEPRO CARB STEADY)  237 mL Oral Q24H  . insulin aspart  0-20 Units Subcutaneous TID WC  . insulin aspart  0-5 Units Subcutaneous QHS  . insulin aspart  4 Units Subcutaneous TID WC  . multivitamin  1 tablet Oral QHS  . sodium chloride flush  10-40 mL Intracatheter Q12H  . vancomycin  250 mg Oral QID   Continuous Infusions: . heparin 1,450 Units/hr  (03/30/16 0041)     LOS: 10 days    Time spent: 35 min    Brandy Dudley U Kilan Banfill, DO [facility] Pager [phone]  If 7PM-7AM, please contact night-coverage [email or web address] Password [email or web address] 03/30/2016, 2:22 PM

## 2016-03-31 ENCOUNTER — Encounter (HOSPITAL_COMMUNITY)
Admission: EM | Disposition: A | Discharge status: Skilled Nursing Facility | Payer: Self-pay | Source: Home / Self Care | Attending: Internal Medicine

## 2016-03-31 LAB — GLUCOSE, CAPILLARY
GLUCOSE-CAPILLARY: 148 mg/dL — AB (ref 65–99)
Glucose-Capillary: 152 mg/dL — ABNORMAL HIGH (ref 65–99)
Glucose-Capillary: 178 mg/dL — ABNORMAL HIGH (ref 65–99)
Glucose-Capillary: 152 mg/dL — ABNORMAL HIGH (ref 65–99)

## 2016-03-31 LAB — RENAL FUNCTION PANEL
ALBUMIN: 2.6 g/dL — AB (ref 3.5–5.0)
ANION GAP: 10 (ref 5–15)
BUN: 15 mg/dL (ref 6–20)
CALCIUM: 8.2 mg/dL — AB (ref 8.9–10.3)
CO2: 28 mmol/L (ref 22–32)
Chloride: 99 mmol/L — ABNORMAL LOW (ref 101–111)
Creatinine, Ser: 1.62 mg/dL — ABNORMAL HIGH (ref 0.44–1.00)
GFR calc non Af Amer: 32 mL/min — ABNORMAL LOW (ref >60)
GFR, EST AFRICAN AMERICAN: 37 mL/min — AB (ref >60)
GLUCOSE: 161 mg/dL — AB (ref 65–99)
PHOSPHORUS: 2.2 mg/dL — AB (ref 2.5–4.6)
Potassium: 3.2 mmol/L — ABNORMAL LOW (ref 3.5–5.1)
SODIUM: 137 mmol/L (ref 135–145)

## 2016-03-31 LAB — PREPARE FRESH FROZEN PLASMA
UNIT DIVISION: 0
UNIT DIVISION: 0

## 2016-03-31 LAB — BASIC METABOLIC PANEL
Anion gap: 12 (ref 5–15)
BUN: 24 mg/dL — AB (ref 6–20)
CO2: 23 mmol/L (ref 22–32)
CREATININE: 2.36 mg/dL — AB (ref 0.44–1.00)
Calcium: 8.5 mg/dL — ABNORMAL LOW (ref 8.9–10.3)
Chloride: 103 mmol/L (ref 101–111)
GFR, EST AFRICAN AMERICAN: 24 mL/min — AB (ref >60)
GFR, EST NON AFRICAN AMERICAN: 20 mL/min — AB (ref >60)
Glucose, Bld: 159 mg/dL — ABNORMAL HIGH (ref 65–99)
POTASSIUM: 4 mmol/L (ref 3.5–5.1)
SODIUM: 138 mmol/L (ref 135–145)

## 2016-03-31 LAB — CBC
HCT: 25.9 % — ABNORMAL LOW (ref 36.0–46.0)
HCT: 23.3 % — ABNORMAL LOW (ref 36.0–46.0)
HEMOGLOBIN: 7.5 g/dL — AB (ref 12.0–15.0)
Hemoglobin: 8.3 g/dL — ABNORMAL LOW (ref 12.0–15.0)
MCH: 28.4 pg (ref 26.0–34.0)
MCH: 28.4 pg (ref 26.0–34.0)
MCHC: 32 g/dL (ref 30.0–36.0)
MCHC: 32.2 g/dL (ref 30.0–36.0)
MCV: 88.7 fL (ref 78.0–100.0)
MCV: 88.3 fL (ref 78.0–100.0)
PLATELETS: 124 10*3/uL — AB (ref 150–400)
Platelets: 126 10*3/uL — ABNORMAL LOW (ref 150–400)
RBC: 2.92 MIL/uL — ABNORMAL LOW (ref 3.87–5.11)
RBC: 2.64 MIL/uL — AB (ref 3.87–5.11)
RDW: 18.5 % — AB (ref 11.5–15.5)
RDW: 18.5 % — ABNORMAL HIGH (ref 11.5–15.5)
WBC: 8.3 10*3/uL (ref 4.0–10.5)
WBC: 10.2 10*3/uL (ref 4.0–10.5)

## 2016-03-31 LAB — PROTIME-INR
INR: 1.52 — AB (ref 0.00–1.49)
Prothrombin Time: 18.3 seconds — ABNORMAL HIGH (ref 11.6–15.2)

## 2016-03-31 LAB — HEPARIN LEVEL (UNFRACTIONATED): HEPARIN UNFRACTIONATED: 0.47 [IU]/mL (ref 0.30–0.70)

## 2016-03-31 SURGERY — INSERTION OF DIALYSIS CATHETER
Anesthesia: Choice | Site: Neck

## 2016-03-31 MED ORDER — ALTEPLASE 2 MG IJ SOLR: 2.0000 mg | Freq: Once | INTRAMUSCULAR | Status: DC | PRN

## 2016-03-31 MED ORDER — SODIUM CHLORIDE 0.9 % IV SOLN: 100.0000 mL | INTRAVENOUS | Status: DC | PRN

## 2016-03-31 MED ORDER — PHYTONADIONE 5 MG PO TABS
10.0000 mg | ORAL_TABLET | Freq: Once | ORAL | Status: AC
  Administered 2016-03-31: 10 mg via ORAL
  Filled 2016-03-31: qty 2

## 2016-03-31 MED ORDER — HEPARIN SODIUM (PORCINE) 1000 UNIT/ML DIALYSIS: 20.0000 [IU]/kg | INTRAMUSCULAR | Status: DC | PRN

## 2016-03-31 MED ORDER — LIDOCAINE-PRILOCAINE 2.5-2.5 % EX CREA
1.0000 "application " | TOPICAL_CREAM | CUTANEOUS | Status: DC | PRN
  Filled 2016-03-31: qty 5

## 2016-03-31 MED ORDER — PENTAFLUOROPROP-TETRAFLUOROETH EX AERO: 1.0000 "application " | INHALATION_SPRAY | CUTANEOUS | Status: DC | PRN

## 2016-03-31 MED ORDER — LIDOCAINE HCL (PF) 1 % IJ SOLN: 5.0000 mL | INTRAMUSCULAR | Status: DC | PRN

## 2016-03-31 MED ORDER — SODIUM CHLORIDE 0.9 % IV SOLN
100.0000 mL | INTRAVENOUS | Status: DC | PRN
  Administered 2016-04-03: 11:00:00 via INTRAVENOUS

## 2016-03-31 MED ORDER — HEPARIN SODIUM (PORCINE) 1000 UNIT/ML DIALYSIS: 1000.0000 [IU] | INTRAMUSCULAR | Status: DC | PRN

## 2016-03-31 NOTE — Progress Notes (Signed)
Report given to Mardene Celeste RN at this time.  Pt is awake, alert, and without ss of any acute distress.

## 2016-03-31 NOTE — Progress Notes (Addendum)
ANTICOAGULATION CONSULT NOTE - Follow Up Consult  Pharmacy Consult for Heparin Indication: hx DVT  Allergies  Allergen Reactions  . Entresto [Sacubitril-Valsartan] Other (See Comments)    On MAR    Patient Measurements: Height: 5\' 4"  (162.6 cm) Weight: 229 lb 15 oz (104.3 kg) IBW/kg (Calculated) : 54.7 Heparin Dosing Weight: 80kg  Vital Signs: Temp: 98.2 F (36.8 C) (05/05 1123) Temp Source: Oral (05/05 1123) BP: 95/54 mmHg (05/05 1123) Pulse Rate: 68 (05/05 1200)  Labs:  Recent Labs  03/29/16 0500 03/29/16 0513  03/29/16 1540 03/30/16 0700 03/30/16 0845 03/31/16 0537  HGB 8.1*  --   --   --  7.8*  --  8.3*  HCT 25.3*  --   --   --  24.8*  --  25.9*  PLT 121*  --   --   --  124*  --  124*  LABPROT 20.2*  --   --   --  19.3*  --  18.3*  INR 1.73*  --   --   --  1.62*  --  1.52*  HEPARINUNFRC  --   --   < > 0.29* 0.41  --  0.47  CREATININE  --  2.72*  --   --   --  2.85* 2.36*  < > = values in this interval not displayed.  Estimated Creatinine Clearance: 28 mL/min (by C-G formula based on Cr of 2.36).  Medications: Heparin at 1450 units/hr  Assessment: 64yof on coumadin pta for recent RIJ DVT. INR on admit supratherapeutic - coumadin was held and reversed for central line and temp HD cath placement. Coumadin then resumed 4/28 and now on hold for permanent HD cath placement. Pharmacy asked to begin heparin once INR < 2 and Heparin was started 5/2.  Planning permanent HD cath once INR < 1.3 (vitamin K 5mg  po given 5/4 and 10mg  po given 5/5) -heparin level= 0.47, INR= 1.52    Goal of Therapy:  Heparin level 0.3-0.7 units/ml Monitor platelets by anticoagulation protocol: Yes   Plan:  -Continue heparin at 1450 units/hr -Daily heparin level, INR, CBC  Harland German, Pharm D 03/31/2016 12:19 PM   Addendum -RN reported a small amount of blood from the rectum  Plan -Decrease heparin to 1350 units/hr -Watch for recurrent or worsening episodes  Harland German,  Pharm D 03/31/2016 2:32 PM

## 2016-03-31 NOTE — Progress Notes (Signed)
PROGRESS NOTE    Brandy Dudley  ZOX:[id number] DOB: November 30, 1950 DOA: 03/20/2016 PCP: PROVIDER NOT IN SYSTEM   Outpatient Specialists:     Brief Narrative:  Brandy Dudley is a 65 y.o. admitted from [place] [facility] to IM service per notes for further evaluation for C. Diff colitis, Acute CHF exacerbation, and ARF on CKD stage III. HF team consulted to assist with fluid management.     Assessment & Plan:   Principal Problem:   Acute on chronic congestive heart failure (HCC) Active Problems:   Physical deconditioning   DVT (deep venous thrombosis), right   Chronic systolic congestive heart failure (HCC)   Cardiomyopathy, ischemic   Coronary artery disease involving native coronary artery of native heart without angina pectoris   Essential hypertension, malignant   Diabetes mellitus type 2 in obese (HCC)   C. difficile colitis   COPD (chronic obstructive pulmonary disease) (HCC)   Chest wall abscess   Obstructive sleep apnea   AICD (automatic cardioverter/defibrillator) present   C. difficile diarrhea   Chronic kidney disease (CKD), stage IV (severe) (HCC)   AKI (acute kidney injury) (HCC)   Respiratory failure (HCC)   Bloody diarrhea   Anasarca   ESRD on dialysis (HCC)    C difficile colitis -on clindamycin recently for MRSA breast abscess - po vancomycin til 5/8  Acute on chronic systolic CHF: Ischemic cardiomyopathy with history of prior CAD  -Echo 03/21/16 35-40% with Grade 2 DD, mod dilated RV, mild LAE, mod RAE, Severe TR, Peak PA 38 mm Hg. She had considerable weight off with CVVH. Off norepinephrine now that she is off CVVH. Had HD 5/1 - Continue coreg 3.125 mg twice a day. Hold SBP <85. -appreciate HF team   AKI on CKD: Now requiring RRT -appreciate Dr. Lowell Guitar -s/p FFP and  of vit K -will vit K again to goal of 1.3  R IJ DVT in 3/17: INR 1.7 , need 1.3 for dialysis catheter placement. Covering with heparin while INR < 2 and coumadin on hold.   CAD: History  not available. On ASA + Plavix in the past. This was held due to hematuria at last admission (foley trauma) and need to use warfarin. Continue atorvastatin.      Anemia: Had blood in stool, likely related to C difficile colitis and high INR. This seems to have resolved and hemoglobin 8.1 today.    Immobility- PT following -SNF eventually (from [facility])    DVT prophylaxis:  Heparin while INR < 2  Code Status: Full Code   Family Communication: No family at bedside  Disposition Plan:  From [facility]-- back when medically ready-- not before next week I suspect   Consultants:   CHF team  PCCM  Procedures:      Subjective: INR still not < 1.3   Objective: Filed Vitals:   03/31/16 1000 03/31/16 1100 03/31/16 1123 03/31/16 1200  BP:   95/54   Pulse: 70 69 95 68  Temp:   98.2 F (36.8 C)   TempSrc:   Oral   Resp: Height:      Weight:      SpO2: 98% 94% 96% 98%    Intake/Output Summary (Last 24 hours) at 03/31/16 1234 Last data filed at 03/31/16 1200  Gross per 24 hour  Intake   1416 ml  Output      0 ml  Net   1416 ml   Filed Weights   03/30/16 0835 03/30/16  1205 03/31/16 0448  Weight: 110.2 kg (242 lb 15.2 oz) 106.8 kg (235 lb 7.2 oz) 104.3 kg (229 lb 15 oz)    Examination:  General exam: Appears calm and comfortable  Respiratory system: Clear to auscultation. Respiratory effort normal. Cardiovascular system: S1 & S2 heard, RRR. +edema Gastrointestinal system: Abdomen is nondistended, soft and nontender. No organomegaly or masses felt. Normal bowel sounds heard. Central nervous system: Alert and oriented. No focal neurological deficits. Extremities: weak Skin: access in groin Psychiatry: Judgement and insight appear normal. Mood & affect appropriate.     Data Reviewed: I have personally reviewed following labs and imaging studies  CBC:  Recent Labs Lab 03/27/16 0430 03/28/16 0405 03/29/16 0500 03/30/16 0700  03/31/16 0537  WBC 17.8* 14.5* 11.0* 8.3 8.3  HGB 8.6* 8.3* 8.1* 7.8* 8.3*  HCT 27.0* 25.7* 25.3* 24.8* 25.9*  MCV 88.5 86.0 88.8 86.7 88.7  PLT 153 129* 121* 124* 124*   Basic Metabolic Panel:  Recent Labs Lab 03/25/16 0500  03/26/16 0400 03/27/16 0430 03/28/16 0405 03/29/16 0410 03/29/16 0513 03/30/16 0845 03/31/16 0537  NA 140  140  < > 139  138 138 138  138 137 138 139 138  K 3.9  3.9  < > 3.5  3.5 3.4* 3.3*  3.3* 3.5 3.6 3.6 4.0  CL 104  104  < > 104  104 104 102  102 102 104 104 103  CO2 26  26  < > 27  27 26 27  27 27 28 27 23   GLUCOSE 260*  258*  < > 223*  223* 227* 176*  179* 238* 242* 156* 159*  BUN 20  20  < > 15  15 35* 32*  32* 26* 26* 34* 24*  CREATININE 2.27*  2.27*  < > 2.25*  2.25* 3.49* 3.19*  3.18* 2.66* 2.72* 2.85* 2.36*  CALCIUM 8.3*  8.4*  < > 8.6*  8.5* 8.5* 8.4*  8.4* 8.0* 8.0* 8.7* 8.5*  MG 2.4  --  2.6*  --  2.3 2.2  --   --   --   PHOS 1.8*  < > 1.7*  1.6* 1.8* 2.7  2.7 2.7 2.7 3.5  --   < > = values in this interval not displayed. GFR: Estimated Creatinine Clearance: 28 mL/min (by C-G formula based on Cr of 2.36). Liver Function Tests:  Recent Labs Lab 03/26/16 0400 03/27/16 0430 03/28/16 0405 03/29/16 0513 03/30/16 0845  ALBUMIN 2.7* 2.7* 2.6* 2.4* 2.7*   No results for input(s): LIPASE, AMYLASE in the last 168 hours. No results for input(s): AMMONIA in the last 168 hours. Coagulation Profile:  Recent Labs Lab 03/27/16 0430 03/28/16 0405 03/29/16 0500 03/30/16 0700 03/31/16 0537  INR 2.36* 1.79* 1.73* 1.62* 1.52*   Cardiac Enzymes: No results for input(s): CKTOTAL, CKMB, CKMBINDEX, TROPONINI in the last 168 hours. BNP (last 3 results) No results for input(s): PROBNP in the last 8760 hours. HbA1C: No results for input(s): HGBA1C in the last 72 hours. CBG:  Recent Labs Lab 03/30/16 1301 03/30/16 1658 03/30/16 2158 03/31/16 0725 03/31/16 1144  GLUCAP 112* 147* 176* 152* 178*   Lipid  Profile: No results for input(s): CHOL, HDL, LDLCALC, TRIG, CHOLHDL, LDLDIRECT in the last 72 hours. Thyroid Function Tests: No results for input(s): TSH, T4TOTAL, FREET4, T3FREE, THYROIDAB in the last 72 hours. Anemia Panel: No results for input(s): VITAMINB12, FOLATE, FERRITIN, TIBC, IRON, RETICCTPCT in the last 72 hours. Urine analysis:    Component Value Date/Time  COLORURINE AMBER* 03/14/2016 1655   APPEARANCEUR CLOUDY* 03/14/2016 1655   LABSPEC 1.019 03/14/2016 1655   PHURINE 5.0 03/14/2016 1655   GLUCOSEU NEGATIVE 03/14/2016 1655   HGBUR LARGE* 03/14/2016 1655   BILIRUBINUR SMALL* 03/14/2016 1655   KETONESUR 15* 03/14/2016 1655   PROTEINUR 30* 03/14/2016 1655   NITRITE NEGATIVE 03/14/2016 1655   LEUKOCYTESUR TRACE* 03/14/2016 1655    Recent Results (from the past 240 hour(s))  MRSA PCR Screening     Status: None   Collection Time: 03/21/16  1:33 PM  Result Value Ref Range Status   MRSA by PCR NEGATIVE NEGATIVE Final    Comment:        The GeneXpert MRSA Assay (FDA approved for NASAL specimens only), is one component of a comprehensive MRSA colonization surveillance program. It is not intended to diagnose MRSA infection nor to guide or monitor treatment for MRSA infections.   C difficile quick scan w PCR reflex     Status: Abnormal   Collection Time: 03/21/16  9:03 PM  Result Value Ref Range Status   C Diff antigen POSITIVE (A) NEGATIVE Final   C Diff toxin NEGATIVE NEGATIVE Final   C Diff interpretation   Final    C. difficile present, but toxin not detected. This indicates colonization. In most cases, this does not require treatment. If patient has signs and symptoms consistent with colitis, consider treatment. Requires ENTERIC precautions.      Anti-infectives    Start     Dose/Rate Route Frequency Ordered Stop   03/29/16 1127  cefUROXime (ZINACEF) 1.5 g in dextrose 5 % 50 mL IVPB     1.5 g 100 mL/hr over 30 Minutes Intravenous To ShortStay Surgical  03/28/16 2305 03/30/16 1130   03/23/16 1400  vancomycin (VANCOCIN) 50 mg/mL oral solution 250 mg     250 mg Oral 4 times daily 03/23/16 1051 04/03/16 1759   03/20/16 1800  vancomycin (VANCOCIN) 50 mg/mL oral solution 125 mg  Status:  Discontinued     125 mg Oral 4 times daily 03/20/16 1637 03/23/16 1051   03/20/16 1230  metroNIDAZOLE (FLAGYL) IVPB 500 mg  Status:  Discontinued     500 mg 100 mL/hr over 60 Minutes Intravenous Every 8 hours 03/20/16 1218 03/20/16 1538       Radiology Studies: No results found.      Scheduled Meds: . antiseptic oral rinse  7 mL Mouth Rinse BID  . atorvastatin  10 mg Oral Daily  . budesonide (PULMICORT) nebulizer solution  0.5 mg Nebulization BID  . carvedilol  3.125 mg Oral BID WC  . famotidine  20 mg Oral Daily  . feeding supplement (NEPRO CARB STEADY)  237 mL Oral Q24H  . insulin aspart  0-20 Units Subcutaneous TID WC  . insulin aspart  0-5 Units Subcutaneous QHS  . insulin aspart  4 Units Subcutaneous TID WC  . multivitamin  1 tablet Oral QHS  . sodium chloride flush  10-40 mL Intracatheter Q12H  . vancomycin  250 mg Oral QID   Continuous Infusions: . heparin 1,450 Units/hr (03/30/16 1800)     LOS: 11 days    Time spent: 35 min    Alexis Mizuno U Aswad Wandrey, DO [facility] Pager [phone]  If 7PM-7AM, please contact night-coverage [email or web address] Password [facility] 03/31/2016, 12:34 PM

## 2016-03-31 NOTE — Progress Notes (Signed)
Nutrition Follow-up  DOCUMENTATION CODES:   Morbid obesity  INTERVENTION:   -D/c Nepro Shake po daily, each supplement provides 425 kcal and 19 grams protein -Encourage good PO intake  NUTRITION DIAGNOSIS:   Increased nutrient needs related to  (hemodialysis) as evidenced by estimated needs.  Ongoing  GOAL:   Patient will meet greater than or equal to 90% of their needs  Progressing  MONITOR:   PO intake, Supplement acceptance, Labs, Weight trends, Skin, I & O's  REASON FOR ASSESSMENT:   Low Braden    ASSESSMENT:   65 year old female with a past medical history significant for CHF (EF 35-40%), CKD Stage III, history of RIJ DVT on warfarin, HTN, HLD, COPD who was admitted on 03/20/16 for C. Diff colitis and acute respiratory distress/AoCHF. Transferred to the ICU on 4/26 for CRRT.  Pt transferred from ICU to SDU on 03/28/16.   Pt continues to receive iHD; last treatment 03/30/16. Per [facility] notes, awaiting HD cath placement once INR is stable.   Spoke with pt at bedside, who reports appetite is very good. She reports that she consumed 100% of her breakfast, however, "there were too many eggs and not enough meat". Discussed basic renal diet principles for renal diet to pt and rationale for limiting high sodium foods, such as breakfast meats.   She does not like Nepro shakes and reports she has been refusing them, due to concern about diarrhea. Pt voiced understanding of fluid restriction and was able to teach back to this RD principles and rationale for fluid restriction.   Case discussed with RN. Pt continues to have a good appetite ("eats like a champ"). Meal completion 75-100%. RN reports pt is very selective about what she eats.   Labs reviewed: CBGS: 147-176.   Diet Order:  Diet renal with fluid restriction Fluid restriction:: 1200 mL Fluid; Room service appropriate?: Yes; Fluid consistency:: Thin  Skin:  Reviewed, no issues  Last BM:  03/31/16  Height:   Ht Readings  from Last 1 Encounters:  03/26/16 5\' 4"  (1.626 m)    Weight:   Wt Readings from Last 1 Encounters:  03/31/16 229 lb 15 oz (104.3 kg)    Ideal Body Weight:  54.5 kg  BMI:  Body mass index is 39.45 kg/(m^2).  Estimated Nutritional Needs:   Kcal:  1900-2100  Protein:  100-115 grams  Fluid:  >/= 1.2 L/day  EDUCATION NEEDS:   No education needs identified at this time  Patina Spanier A. Mayford Knife, RD, LDN, CDE Pager: [phone] After hours Pager: [phone]

## 2016-03-31 NOTE — Progress Notes (Signed)
Pt is a 65 y.o. yo female who was admitted on 03/20/2016 with C Diff colitis and Acute respiratory distress/ AoCHF   1. AKI/CKD in the setting of decompensated biventricular CHF: Off CVVHD. IHD initiated 5/1 with last HD 5/2. Will move tomorrows scheduled dialysis forward to today so femoral catheter can be removed. Encouraged getting out of bed after catheter removal. Vascular surgery consulted concerning perm cath and AV access. Vascular surgery waiting for INR to be <1.3, currently 1.52. Vein mapping on 5/1. Cr 2.36 today, baseline 1.8-2. Continue OP CLIP process. 2. Volume/ Pressure: BP stable. Off pressors. Holding Carvedilol and Torsemide. 3. Anemia/Thrombocytopenia: Hgb 8.3, Platelets 124 down from 165 at admission.  4. H/o DVT - INR 1.73. Pharmacy following/ management per primary 5. GI bleed- Monitor closely. 6. CAD/iCM/CHF- cards managing. 7. DM2: SSI per primary 8. C Diff colitis- on Vanc PO per primary.  Renal Attending: Goal today is to get femoral HD catheter out to help mobilize pt OOB. Will do HD today.  Anticipate good INR for [facility] in near future. Kema Santaella C   Subjective: No acute concerns today. Agreeable to moving HD forward to today so she can begin getting out of bed to chair.  Objective: Vital signs in last 24 hours: Temp:  [97.7 F (36.5 C)-98.7 F (37.1 C)] 98.2 F (36.8 C) (05/05 1123) Pulse Rate:  [66-98] 69 (05/05 1300) Resp:  [12-25] 25 (05/05 1300) BP: (95-119)/(54-62) 95/54 mmHg (05/05 1123) SpO2:  [93 %-100 %] 98 % (05/05 1300) Weight:  [229 lb 15 oz (104.3 kg)] 229 lb 15 oz (104.3 kg) (05/05 0448) Weight change: 3 lb 12 oz (1.7 kg)  Intake/Output from previous day: 05/04 0701 - 05/05 0700 In: 948 [P.O.:600; I.V.:348] Out: 2500  Intake/Output this shift: Total I/O In: 497 [I.V.:497] Out: -   General appearance: alert and cooperative GI: soft, non-tender; bowel sounds normal; no masses,  no organomegaly Extremities: edema trace  Lab  Results:  Recent Labs  03/30/16 0700 03/31/16 0537  WBC 8.3 8.3  HGB 7.8* 8.3*  HCT 24.8* 25.9*  PLT 124* 124*   BMET:   Recent Labs  03/30/16 0845 03/31/16 0537  NA 139 138  K 3.6 4.0  CL 104 103  CO2 27 23  GLUCOSE 156* 159*  BUN 34* 24*  CREATININE 2.85* 2.36*  CALCIUM 8.7* 8.5*   No results for input(s): PTH in the last 72 hours. Iron Studies: No results for input(s): IRON, TIBC, TRANSFERRIN, FERRITIN in the last 72 hours. Studies/Results: No results found.  Scheduled: . antiseptic oral rinse  7 mL Mouth Rinse BID  . atorvastatin  10 mg Oral Daily  . budesonide (PULMICORT) nebulizer solution  0.5 mg Nebulization BID  . carvedilol  3.125 mg Oral BID WC  . famotidine  20 mg Oral Daily  . insulin aspart  0-20 Units Subcutaneous TID WC  . insulin aspart  0-5 Units Subcutaneous QHS  . insulin aspart  4 Units Subcutaneous TID WC  . multivitamin  1 tablet Oral QHS  . sodium chloride flush  10-40 mL Intracatheter Q12H  . vancomycin  250 mg Oral QID     LOS: 11 days   [facility] 03/31/2016,3:01 PM

## 2016-03-31 NOTE — Progress Notes (Signed)
Can start at [facility] on Tuesday May 9th .Needs to be there at 10:45.Days will be Tuesday,Thursday and Saturday 2nd shift pending on Nephrologists approval

## 2016-03-31 NOTE — Progress Notes (Signed)
Per nephrology (Dr. Lowell Guitar), stop heparin 2 hours before removing dialysis catheter and restart 4 hours after dialysis catheter is removed.

## 2016-03-31 NOTE — Progress Notes (Signed)
PT Cancellation Note  Patient Details Name: Brandy Dudley MRN: [id number] DOB: 1950-12-30   Cancelled Treatment:    Reason Eval/Treat Not Completed: Patient at procedure or test/unavailable. Off the unit.   Avis Tirone 03/31/2016, 4:34 PM  Pager [phone]

## 2016-04-01 LAB — CBC
HCT: 23.6 % — ABNORMAL LOW (ref 36.0–46.0)
Hemoglobin: 7.6 g/dL — ABNORMAL LOW (ref 12.0–15.0)
MCH: 28.5 pg (ref 26.0–34.0)
MCHC: 32.2 g/dL (ref 30.0–36.0)
MCV: 88.4 fL (ref 78.0–100.0)
Platelets: 142 10*3/uL — ABNORMAL LOW (ref 150–400)
RBC: 2.67 MIL/uL — ABNORMAL LOW (ref 3.87–5.11)
RDW: 18.3 % — ABNORMAL HIGH (ref 11.5–15.5)
WBC: 8.1 10*3/uL (ref 4.0–10.5)

## 2016-04-01 LAB — GLUCOSE, CAPILLARY
GLUCOSE-CAPILLARY: 171 mg/dL — AB (ref 65–99)
Glucose-Capillary: 119 mg/dL — ABNORMAL HIGH (ref 65–99)
Glucose-Capillary: 147 mg/dL — ABNORMAL HIGH (ref 65–99)
Glucose-Capillary: 127 mg/dL — ABNORMAL HIGH (ref 65–99)

## 2016-04-01 LAB — TYPE AND SCREEN
ABO/RH(D): A POS
Antibody Screen: NEGATIVE

## 2016-04-01 LAB — PROTIME-INR
INR: 1.47 (ref 0.00–1.49)
Prothrombin Time: 17.9 seconds — ABNORMAL HIGH (ref 11.6–15.2)

## 2016-04-01 LAB — HEPARIN LEVEL (UNFRACTIONATED)
HEPARIN UNFRACTIONATED: 0.26 [IU]/mL — AB (ref 0.30–0.70)
Heparin Unfractionated: 0.22 IU/mL — ABNORMAL LOW (ref 0.30–0.70)

## 2016-04-01 LAB — POCT ACTIVATED CLOTTING TIME: Activated Clotting Time: 157 seconds

## 2016-04-01 LAB — OCCULT BLOOD X 1 CARD TO LAB, STOOL: Fecal Occult Bld: POSITIVE — AB

## 2016-04-01 NOTE — Progress Notes (Signed)
Dr. Benjamine Mola updated on positive fecal occult stool. Will continue heparin for now and continue to monitor CBC. No new orders at this time will continue to follow closely.

## 2016-04-01 NOTE — Progress Notes (Signed)
R femoral trialysis catheter removed, pressure held for 20 minutes. Dressed with vaseline gauze and a pressure dressing. VSS. Pt with no complaints. Will continue to monitor.

## 2016-04-01 NOTE — Progress Notes (Signed)
Pt is a 65 y.o. yo female who was admitted on 03/20/2016 with C Diff colitis and Acute respiratory distress/ AoCHF   1. AKI/CKD in the setting of decompensated biventricular CHF: Off CVVHD. IHD initiated 5/1 with last HD 5/5. Right femoral dialysis catheter removed 5/6.Marland Kitchen Encouraged getting out of bed after catheter removal. Vascular surgery consulted concerning perm cath and AV access. Vascular surgery waiting for INR to be <1.3, currently 1.47. Vein mapping on 5/1. Cr 2.36 yesterday, baseline 1.8-2. Continue OP CLIP process. Will repeat renal function panel 5/7. Recommend transfer out of step down. 2. Volume/ Pressure: BP stable. Off pressors.  3. Anemia/Thrombocytopenia: Hgb 7.5, Platelets 126 down from 165 at admission.  4. H/o DVT - INR 1.47. Pharmacy following/ management per primary 5. GI bleed- Monitor closely. 6. CAD/iCM/CHF- cards managing. 7. DM2: SSI per primary 8. C Diff colitis- on Vanc PO per primary.  Renal Attending: HD off sched yesterday and we removed the femoral HD catheter for mobilization in anticipation of a TDC and AV access Monday. Peyson Delao C   Subjective: No acute concerns today. Awaiting INR to lower so dialysis catheter can be placed.  Objective: Vital signs in last 24 hours: Temp:  [96.7 F (35.9 C)-98.8 F (37.1 C)] 98.4 F (36.9 C) (05/06 0450) Pulse Rate:  [67-95] 71 (05/06 0450) Resp:  [12-25] 23 (05/06 0450) BP: (89-103)/(49-61) 103/58 mmHg (05/06 0450) SpO2:  [94 %-100 %] 100 % (05/06 0450) Weight:  [230 lb 13.2 oz (104.7 kg)-235 lb 0.2 oz (106.6 kg)] 235 lb 0.2 oz (106.6 kg) (05/06 0450) Weight change: -10 lb 12.8 oz (-4.9 kg)  Intake/Output from previous day: 05/05 0701 - 05/06 0700 In: 614.6 [I.V.:614.6] Out: 1500  Intake/Output this shift:    General appearance: alert and cooperative GI: soft, non-tender; bowel sounds normal; no masses,  no organomegaly Extremities: edema trace  Lab Results:  Recent Labs  03/31/16 0537  03/31/16 2000  WBC 8.3 10.2  HGB 8.3* 7.5*  HCT 25.9* 23.3*  PLT 124* 126*   BMET:   Recent Labs  03/31/16 0537 03/31/16 1945  NA 138 137  K 4.0 3.2*  CL 103 99*  CO2 23 28  GLUCOSE 159* 161*  BUN 24* 15  CREATININE 2.36* 1.62*  CALCIUM 8.5* 8.2*   No results for input(s): PTH in the last 72 hours. Iron Studies: No results for input(s): IRON, TIBC, TRANSFERRIN, FERRITIN in the last 72 hours. Studies/Results: No results found.  Scheduled: . antiseptic oral rinse  7 mL Mouth Rinse BID  . atorvastatin  10 mg Oral Daily  . budesonide (PULMICORT) nebulizer solution  0.5 mg Nebulization BID  . carvedilol  3.125 mg Oral BID WC  . famotidine  20 mg Oral Daily  . insulin aspart  0-20 Units Subcutaneous TID WC  . insulin aspart  0-5 Units Subcutaneous QHS  . insulin aspart  4 Units Subcutaneous TID WC  . multivitamin  1 tablet Oral QHS  . sodium chloride flush  10-40 mL Intracatheter Q12H  . vancomycin  250 mg Oral QID     LOS: 12 days   [facility] 04/01/2016,7:46 AM

## 2016-04-01 NOTE — Progress Notes (Signed)
ANTICOAGULATION CONSULT NOTE - Follow Up Consult  Pharmacy Consult for Heparin Indication: hx DVT  Allergies  Allergen Reactions  . Entresto [Sacubitril-Valsartan] Other (See Comments)    On MAR    Patient Measurements: Height: 5\' 4"  (162.6 cm) Weight: 235 lb 0.2 oz (106.6 kg) IBW/kg (Calculated) : 54.7 Heparin Dosing Weight: 80kg  Vital Signs: Temp: 98.1 F (36.7 C) (05/06 1200) Temp Source: Oral (05/06 1200) BP: 103/54 mmHg (05/06 1200) Pulse Rate: 81 (05/06 0800)  Labs:  Recent Labs  03/30/16 0700 03/30/16 0845 03/31/16 0537 03/31/16 1945 03/31/16 2000 04/01/16 0530 04/01/16 1150 04/01/16 1330  HGB 7.8*  --  8.3*  --  7.5*  --   --  7.6*  HCT 24.8*  --  25.9*  --  23.3*  --   --  23.6*  PLT 124*  --  124*  --  126*  --   --  142*  LABPROT 19.3*  --  18.3*  --   --  17.9*  --   --   INR 1.62*  --  1.52*  --   --  1.47  --   --   HEPARINUNFRC 0.41  --  0.47  --   --   --  0.22*  --   CREATININE  --  2.85* 2.36* 1.62*  --   --   --   --     Estimated Creatinine Clearance: 41.3 mL/min (by C-G formula based on Cr of 1.62).  Assessment: 65 yo F on warfarin pta for recent RIJ DVT. INR on admit supratherapeutic - warfarin was held and reversed for central line and temp HD cath placement. Warfarin then resumed 4/28 and now on hold for permanent HD cath placement. Pharmacy asked to begin heparin once INR < 2 and Heparin was started 5/2.  Planning permanent HD cath once INR < 1.3 (vitamin K 5mg  po given 5/4 and 10mg  po given 5/5).  Heparin level yesterday therapeutic at 0.47 on 1450 units/hr. RN then noted a small amount of blood from rectum and rate was reduced to 1350 units/hr. Heparin was held last night for ~6hr for removal of temporary HD cath and restarted this morning at 0500 at 1350 units/hr (no bolus). 6 hr heparin level subtherapeutic at 0.22, INR 1.47.  Nurse reports no further rectal bleeding, but planning to check a FOBT with next BM. Hgb stable at 7.6, Plt  ~150. INR today is 1.47.   Goal of Therapy:  Heparin level 0.3-0.7 units/ml Monitor platelets by anticoagulation protocol: Yes   Plan:  --Increase heparin to 1450 units/hr --HL in 6 hr at 2000 --Daily heparin level, INR, CBC --Monitor for bleeding; f/u FOBT --Planning for permanent HD cath and AV access once INR <1.3  Arcola Jansky, PharmD Clinical Pharmacy Resident Pager: [phone] 04/01/2016 1:58 PM

## 2016-04-01 NOTE — Progress Notes (Signed)
ANTICOAGULATION CONSULT NOTE - Follow Up Consult  Pharmacy Consult for Heparin Indication: hx DVT  Allergies  Allergen Reactions  . Entresto [Sacubitril-Valsartan] Other (See Comments)    On MAR    Patient Measurements: Height: 5\' 4"  (162.6 cm) Weight: 235 lb 0.2 oz (106.6 kg) IBW/kg (Calculated) : 54.7 Heparin Dosing Weight: 80kg  Vital Signs: Temp: 98.5 F (36.9 C) (05/06 1812) Temp Source: Oral (05/06 1812) BP: 102/59 mmHg (05/06 1812) Pulse Rate: 70 (05/06 1812)  Labs:  Recent Labs  03/30/16 0700 03/30/16 0845 03/31/16 0537 03/31/16 1945 03/31/16 2000 04/01/16 0530 04/01/16 1150 04/01/16 1330 04/01/16 1942  HGB 7.8*  --  8.3*  --  7.5*  --   --  7.6*  --   HCT 24.8*  --  25.9*  --  23.3*  --   --  23.6*  --   PLT 124*  --  124*  --  126*  --   --  142*  --   LABPROT 19.3*  --  18.3*  --   --  17.9*  --   --   --   INR 1.62*  --  1.52*  --   --  1.47  --   --   --   HEPARINUNFRC 0.41  --  0.47  --   --   --  0.22*  --  0.26*  CREATININE  --  2.85* 2.36* 1.62*  --   --   --   --   --     Estimated Creatinine Clearance: 41.3 mL/min (by C-G formula based on Cr of 1.62).  Assessment: 65 yo F on warfarin pta for recent RIJ DVT. INR on admit supratherapeutic - warfarin was held and reversed for central line and temp HD cath placement. Warfarin then resumed 4/28 and now on hold for permanent HD cath placement. Pharmacy asked to begin heparin once INR < 2 and Heparin was started 5/2.  Planning permanent HD cath once INR < 1.3 (vitamin K 5mg  po given 5/4 and 10mg  po given 5/5).  Heparin level this evening remains SUBtherapeutic (HL 0.26 << 0.22, goal of 0.3-0.7) Hgb 7.6, plts 142. FOB noted to be positive - MD aware and to continue with heparin drip for now. RN will continue to watch.   Goal of Therapy:  Heparin level 0.3-0.7 units/ml Monitor platelets by anticoagulation protocol: Yes   Plan:  1. Increase heparin drip rate slightly to 1550 units/hr (15.5 ml/hr) 2.  Will continue to monitor for any signs/symptoms of bleeding and will follow up with heparin level in 6 hours   Georgina Pillion, PharmD, BCPS Clinical Pharmacist Pager: [phone] 04/01/2016 8:46 PM

## 2016-04-01 NOTE — Progress Notes (Signed)
PROGRESS NOTE    Brandy Dudley  ZOX:[id number] DOB: 1951/03/15 DOA: 03/20/2016 PCP: PROVIDER NOT IN SYSTEM   Outpatient Specialists:     Brief Narrative:  Brandy Dudley is a 65 y.o. admitted from [facility] to IM service per notes for further evaluation for C. Diff colitis, Acute CHF exacerbation, and ARF on CKD stage III. HF team consulted to assist with fluid management.  Found to need HD  And awaiting INR of < 1.3 for perm cath and AV fistula   Assessment & Plan:   Principal Problem:   Acute on chronic congestive heart failure (HCC) Active Problems:   Physical deconditioning   DVT (deep venous thrombosis), right   Chronic systolic congestive heart failure (HCC)   Cardiomyopathy, ischemic   Coronary artery disease involving native coronary artery of native heart without angina pectoris   Essential hypertension, malignant   Diabetes mellitus type 2 in obese (HCC)   C. difficile colitis   COPD (chronic obstructive pulmonary disease) (HCC)   Chest wall abscess   Obstructive sleep apnea   AICD (automatic cardioverter/defibrillator) present   C. difficile diarrhea   Chronic kidney disease (CKD), stage IV (severe) (HCC)   AKI (acute kidney injury) (HCC)   Respiratory failure (HCC)   Bloody diarrhea   Anasarca   ESRD on dialysis (HCC)    C difficile colitis -on clindamycin recently for MRSA breast abscess - po vancomycin til 5/8  Acute on chronic systolic CHF: Ischemic cardiomyopathy with history of prior CAD  -Echo 03/21/16 35-40% with Grade 2 DD, mod dilated RV, mild LAE, mod RAE, Severe TR, Peak PA 38 mm Hg. She had considerable weight off with CVVH. Off norepinephrine now that she is off CVVH. Had HD 5/1 - Continue coreg 3.125 mg twice a day. Hold SBP <85. -appreciate HF team   AKI on CKD: Now requiring RRT -appreciate Dr. Lowell Guitar -s/p FFP and 10mg  of vit K -will vit K again to goal of 1.3 NPO after midnight on SUnday  R IJ DVT in 3/17: INR 1.7 , need 1.3 for  dialysis catheter placement. Covering with heparin while INR < 2 and coumadin on hold.   CAD: History not available. On ASA + Plavix in the past. This was held due to hematuria at last admission (foley trauma) and need to use warfarin. Continue atorvastatin.      Anemia: Had blood in stool, likely related to C difficile colitis and high INR. -Hgb trending down, re-check stool for blood   Immobility- PT following -SNF eventually (from [facility])    DVT prophylaxis:  Heparin while INR < 2  Code Status: Full Code   Family Communication: No family at bedside  Disposition Plan:  From [facility]-- back when medically ready-- not before next week I suspect   Consultants:   CHF team  PCCM  Procedures:      Subjective: INR still not < 1.3 Small amount of blood in stool   Objective: Filed Vitals:   04/01/16 0450 04/01/16 0800 04/01/16 0809 04/01/16 1200  BP: 103/58 102/70  103/54  Pulse: 71 81    Temp: 98.4 F (36.9 C) 98.5 F (36.9 C)  98.1 F (36.7 C)  TempSrc: Oral Oral  Oral  Resp: 23     Height:      Weight: 106.6 kg (235 lb 0.2 oz)     SpO2: 100% 96% 99% 99%    Intake/Output Summary (Last 24 hours) at 04/01/16 1433 Last data filed at 04/01/16 1300  Gross per 24 hour  Intake 398.58 ml  Output   1500 ml  Net -1101.42 ml   Filed Weights   03/31/16 1453 03/31/16 1837 04/01/16 0450  Weight: 105.3 kg (232 lb 2.3 oz) 104.7 kg (230 lb 13.2 oz) 106.6 kg (235 lb 0.2 oz)    Examination:  General exam: Appears calm and comfortable  Respiratory system: Clear to auscultation. Respiratory effort normal. Cardiovascular system: S1 & S2 heard, RRR. +edema Gastrointestinal system: Abdomen is nondistended, soft and nontender. No organomegaly or masses felt. Normal bowel sounds heard. Central nervous system: Alert and oriented. No focal neurological deficits. Extremities: weak Skin: access in groin Psychiatry: Judgement and insight appear normal.  Mood & affect appropriate.     Data Reviewed: I have personally reviewed following labs and imaging studies  CBC:  Recent Labs Lab 03/29/16 0500 03/30/16 0700 03/31/16 0537 03/31/16 2000 04/01/16 1330  WBC 11.0* 8.3 8.3 10.2 8.1  HGB 8.1* 7.8* 8.3* 7.5* 7.6*  HCT 25.3* 24.8* 25.9* 23.3* 23.6*  MCV 88.8 86.7 88.7 88.3 88.4  PLT 121* 124* 124* 126* 142*   Basic Metabolic Panel:  Recent Labs Lab 03/26/16 0400  03/28/16 0405 03/29/16 0410 03/29/16 0513 03/30/16 0845 03/31/16 0537 03/31/16 1945  NA 139  138  < > 138  138 137 138 139 138 137  K 3.5  3.5  < > 3.3*  3.3* 3.5 3.6 3.6 4.0 3.2*  CL 104  104  < > 102  102 102 104 104 103 99*  CO2 27  27  < > GLUCOSE 223*  223*  < > 176*  179* 238* 242* 156* 159* 161*  BUN 15  15  < > 32*  32* 26* 26* 34* 24* 15  CREATININE 2.25*  2.25*  < > 3.19*  3.18* 2.66* 2.72* 2.85* 2.36* 1.62*  CALCIUM 8.6*  8.5*  < > 8.4*  8.4* 8.0* 8.0* 8.7* 8.5* 8.2*  MG 2.6*  --  2.3 2.2  --   --   --   --   PHOS 1.7*  1.6*  < > 2.7  2.7 2.7 2.7 3.5  --  2.2*  < > = values in this interval not displayed. GFR: Estimated Creatinine Clearance: 41.3 mL/min (by C-G formula based on Cr of 1.62). Liver Function Tests:  Recent Labs Lab 03/27/16 0430 03/28/16 0405 03/29/16 0513 03/30/16 0845 03/31/16 1945  ALBUMIN 2.7* 2.6* 2.4* 2.7* 2.6*   No results for input(s): LIPASE, AMYLASE in the last 168 hours. No results for input(s): AMMONIA in the last 168 hours. Coagulation Profile:  Recent Labs Lab 03/28/16 0405 03/29/16 0500 03/30/16 0700 03/31/16 0537 04/01/16 0530  INR 1.79* 1.73* 1.62* 1.52* 1.47   Cardiac Enzymes: No results for input(s): CKTOTAL, CKMB, CKMBINDEX, TROPONINI in the last 168 hours. BNP (last 3 results) No results for input(s): PROBNP in the last 8760 hours. HbA1C: No results for input(s): HGBA1C in the last 72 hours. CBG:  Recent Labs Lab 03/31/16 1144 03/31/16 1946  03/31/16 2119 04/01/16 0812 04/01/16 1221  GLUCAP 178* 152* 148* 119* 147*   Lipid Profile: No results for input(s): CHOL, HDL, LDLCALC, TRIG, CHOLHDL, LDLDIRECT in the last 72 hours. Thyroid Function Tests: No results for input(s): TSH, T4TOTAL, FREET4, T3FREE, THYROIDAB in the last 72 hours. Anemia Panel: No results for input(s): VITAMINB12, FOLATE, FERRITIN, TIBC, IRON, RETICCTPCT in the last 72 hours. Urine analysis:    Component Value Date/Time  COLORURINE AMBER* 03/14/2016 1655   APPEARANCEUR CLOUDY* 03/14/2016 1655   LABSPEC 1.019 03/14/2016 1655   PHURINE 5.0 03/14/2016 1655   GLUCOSEU NEGATIVE 03/14/2016 1655   HGBUR LARGE* 03/14/2016 1655   BILIRUBINUR SMALL* 03/14/2016 1655   KETONESUR 15* 03/14/2016 1655   PROTEINUR 30* 03/14/2016 1655   NITRITE NEGATIVE 03/14/2016 1655   LEUKOCYTESUR TRACE* 03/14/2016 1655    No results found for this or any previous visit (from the past 240 hour(s)).    Anti-infectives    Start     Dose/Rate Route Frequency Ordered Stop   03/29/16 1127  cefUROXime (ZINACEF) 1.5 g in dextrose 5 % 50 mL IVPB     1.5 g 100 mL/hr over 30 Minutes Intravenous To [facility] 03/28/16 2305 03/30/16 1130   03/23/16 1400  vancomycin (VANCOCIN) 50 mg/mL oral solution 250 mg     250 mg Oral 4 times daily 03/23/16 1051 04/03/16 1759   03/20/16 1800  vancomycin (VANCOCIN) 50 mg/mL oral solution 125 mg  Status:  Discontinued     125 mg Oral 4 times daily 03/20/16 1637 03/23/16 1051   03/20/16 1230  metroNIDAZOLE (FLAGYL) IVPB 500 mg  Status:  Discontinued     500 mg 100 mL/hr over 60 Minutes Intravenous Every 8 hours 03/20/16 1218 03/20/16 1538       Radiology Studies: No results found.      Scheduled Meds: . antiseptic oral rinse  7 mL Mouth Rinse BID  . atorvastatin  10 mg Oral Daily  . budesonide (PULMICORT) nebulizer solution  0.5 mg Nebulization BID  . carvedilol  3.125 mg Oral BID WC  . famotidine  20 mg Oral Daily  . insulin  aspart  0-20 Units Subcutaneous TID WC  . insulin aspart  0-5 Units Subcutaneous QHS  . insulin aspart  4 Units Subcutaneous TID WC  . multivitamin  1 tablet Oral QHS  . sodium chloride flush  10-40 mL Intracatheter Q12H  . vancomycin  250 mg Oral QID   Continuous Infusions: . heparin 1,450 Units/hr (04/01/16 1420)     LOS: 12 days    Time spent: 35 min    Williams Dietrick U Nikiesha Milford, DO [facility] Pager [phone]  If 7PM-7AM, please contact night-coverage [email or web address] Password [facility] 04/01/2016, 2:33 PM

## 2016-04-02 DIAGNOSIS — L8990 Pressure ulcer of unspecified site, unspecified stage: Secondary | ICD-10-CM | POA: Insufficient documentation

## 2016-04-02 LAB — RENAL FUNCTION PANEL
ALBUMIN: 2.6 g/dL — AB (ref 3.5–5.0)
ANION GAP: 11 (ref 5–15)
BUN: 27 mg/dL — ABNORMAL HIGH (ref 6–20)
CALCIUM: 8.6 mg/dL — AB (ref 8.9–10.3)
CO2: 26 mmol/L (ref 22–32)
Chloride: 99 mmol/L — ABNORMAL LOW (ref 101–111)
Creatinine, Ser: 2.94 mg/dL — ABNORMAL HIGH (ref 0.44–1.00)
GFR, EST AFRICAN AMERICAN: 18 mL/min — AB (ref >60)
GFR, EST NON AFRICAN AMERICAN: 16 mL/min — AB (ref >60)
Glucose, Bld: 113 mg/dL — ABNORMAL HIGH (ref 65–99)
PHOSPHORUS: 3.6 mg/dL (ref 2.5–4.6)
Potassium: 3.8 mmol/L (ref 3.5–5.1)
SODIUM: 136 mmol/L (ref 135–145)

## 2016-04-02 LAB — CBC
HCT: 23.2 % — ABNORMAL LOW (ref 36.0–46.0)
HEMOGLOBIN: 7.4 g/dL — AB (ref 12.0–15.0)
MCH: 28.4 pg (ref 26.0–34.0)
MCHC: 31.9 g/dL (ref 30.0–36.0)
MCV: 88.9 fL (ref 78.0–100.0)
Platelets: 149 10*3/uL — ABNORMAL LOW (ref 150–400)
RBC: 2.61 MIL/uL — AB (ref 3.87–5.11)
RDW: 18.4 % — ABNORMAL HIGH (ref 11.5–15.5)
WBC: 7.9 10*3/uL (ref 4.0–10.5)

## 2016-04-02 LAB — HEPARIN LEVEL (UNFRACTIONATED)
HEPARIN UNFRACTIONATED: 0.24 [IU]/mL — AB (ref 0.30–0.70)
Heparin Unfractionated: 0.41 IU/mL (ref 0.30–0.70)

## 2016-04-02 LAB — PROTIME-INR
INR: 1.42 (ref 0.00–1.49)
Prothrombin Time: 17.4 seconds — ABNORMAL HIGH (ref 11.6–15.2)

## 2016-04-02 LAB — GLUCOSE, CAPILLARY
GLUCOSE-CAPILLARY: 113 mg/dL — AB (ref 65–99)
GLUCOSE-CAPILLARY: 165 mg/dL — AB (ref 65–99)
GLUCOSE-CAPILLARY: 177 mg/dL — AB (ref 65–99)
Glucose-Capillary: 143 mg/dL — ABNORMAL HIGH (ref 65–99)

## 2016-04-02 MED ORDER — CEFAZOLIN SODIUM 1-5 GM-% IV SOLN: 1.0000 g | INTRAVENOUS | Status: DC

## 2016-04-02 MED ORDER — VITAMIN K1 10 MG/ML IJ SOLN
5.0000 mg | Freq: Once | INTRAMUSCULAR | Status: AC
  Administered 2016-04-02: 5 mg via INTRAVENOUS
  Filled 2016-04-02: qty 0.5

## 2016-04-02 MED ORDER — CEFAZOLIN SODIUM-DEXTROSE 2-4 GM/100ML-% IV SOLN
2.0000 g | INTRAVENOUS | Status: AC
  Administered 2016-04-03: 2 g via INTRAVENOUS
  Filled 2016-04-02 (×2): qty 100

## 2016-04-02 NOTE — Progress Notes (Signed)
PROGRESS NOTE    Brandy Dudley  RAQ:[id number] DOB: 03/12/1951 DOA: 03/20/2016 PCP: PROVIDER NOT IN SYSTEM   Outpatient Specialists:     Brief Narrative:  Brandy Dudley is a 65 y.o. admitted from [facility] to IM service per notes for further evaluation for C. Diff colitis, Acute CHF exacerbation, and ARF on CKD stage III. HF team consulted to assist with fluid management.  Found to need HD  And awaiting INR of < 1.3 for perm cath and AV fistula   Assessment & Plan:   Principal Problem:   Acute on chronic congestive heart failure (HCC) Active Problems:   Physical deconditioning   DVT (deep venous thrombosis), right   Chronic systolic congestive heart failure (HCC)   Cardiomyopathy, ischemic   Coronary artery disease involving native coronary artery of native heart without angina pectoris   Essential hypertension, malignant   Diabetes mellitus type 2 in obese (HCC)   C. difficile colitis   COPD (chronic obstructive pulmonary disease) (HCC)   Chest wall abscess   Obstructive sleep apnea   AICD (automatic cardioverter/defibrillator) present   C. difficile diarrhea   Chronic kidney disease (CKD), stage IV (severe) (HCC)   AKI (acute kidney injury) (HCC)   Respiratory failure (HCC)   Bloody diarrhea   Anasarca   ESRD on dialysis (HCC)   Pressure ulcer    C difficile colitis -on clindamycin recently for MRSA breast abscess - po vancomycin til 5/8  Acute on chronic systolic CHF: Ischemic cardiomyopathy with history of prior CAD  -Echo 03/21/16 35-40% with Grade 2 DD, mod dilated RV, mild LAE, mod RAE, Severe TR, Peak PA 38 mm Hg. She had considerable weight off with CVVH. Off norepinephrine now that she is off CVVH. Had HD 5/1 - Continue coreg 3.125 mg twice a day. Hold SBP <85. -appreciate HF team   AKI on CKD: Now requiring RRT -appreciate Dr. Lowell Guitar -s/p FFP and 10mg  of vit K -will vit K again to goal of 1.3 NPO after midnight on SUnday  R IJ DVT in 3/17: INR 1.7 ,  need 1.3 for dialysis catheter placement. Covering with heparin while INR < 2 and coumadin on hold.   CAD: History not available. On ASA + Plavix in the past. This was held due to hematuria at last admission (foley trauma) and need to use warfarin. Continue atorvastatin.      Anemia: Had blood in stool, likely related to C difficile colitis and high INR. -Hgb trending down, re-check stool for blood   Immobility- PT following -SNF eventually (from [facility])    DVT prophylaxis:  Heparin while INR < 2  Code Status: Full Code   Family Communication: No family at bedside  Disposition Plan:  From [facility]-- back when medically ready-- not before next week I suspect   Consultants:   CHF team  PCCM  Procedures:      Subjective: No further blood in stool Eating well   Objective: Filed Vitals:   04/01/16 2115 04/02/16 0525 04/02/16 0844 04/02/16 0900  BP: 97/50 106/57  109/59  Pulse: 75 76  72  Temp: 97.9 F (36.6 C) 97.6 F (36.4 C)  97.7 F (36.5 C)  TempSrc: Oral Oral  Oral  Resp: 16 18  18   Height:      Weight:  109.1 kg (240 lb 8.4 oz)    SpO2: 99% 100% 98% 98%    Intake/Output Summary (Last 24 hours) at 04/02/16 1308 Last data filed at 04/02/16 4562  Gross per 24 hour  Intake 564.07 ml  Output      0 ml  Net 564.07 ml   Filed Weights   03/31/16 1837 04/01/16 0450 04/02/16 0525  Weight: 104.7 kg (230 lb 13.2 oz) 106.6 kg (235 lb 0.2 oz) 109.1 kg (240 lb 8.4 oz)    Examination:  General exam: Appears calm and comfortable  Respiratory system: Clear to auscultation. Respiratory effort normal. Cardiovascular system: S1 & S2 heard, RRR. +edema Gastrointestinal system: Abdomen is nondistended, soft and nontender. No organomegaly or masses felt. Normal bowel sounds heard. Central nervous system: Alert and oriented. No focal neurological deficits. Extremities: weak Psychiatry: Judgement and insight appear normal. Mood & affect  appropriate.     Data Reviewed: I have personally reviewed following labs and imaging studies  CBC:  Recent Labs Lab 03/30/16 0700 03/31/16 0537 03/31/16 2000 04/01/16 1330 04/02/16 0444  WBC 8.3 8.3 10.2 8.1 7.9  HGB 7.8* 8.3* 7.5* 7.6* 7.4*  HCT 24.8* 25.9* 23.3* 23.6* 23.2*  MCV 86.7 88.7 88.3 88.4 88.9  PLT 124* 124* 126* 142* 149*   Basic Metabolic Panel:  Recent Labs Lab 03/28/16 0405 03/29/16 0410 03/29/16 0513 03/30/16 0845 03/31/16 0537 03/31/16 1945 04/02/16 0438  NA 138  138 137 138 139 138 137 136  K 3.3*  3.3* 3.5 3.6 3.6 4.0 3.2* 3.8  CL 102  102 102 104 104 103 99* 99*  CO2 27  27 27 28 27 23 28 26   GLUCOSE 176*  179* 238* 242* 156* 159* 161* 113*  BUN 32*  32* 26* 26* 34* 24* 15 27*  CREATININE 3.19*  3.18* 2.66* 2.72* 2.85* 2.36* 1.62* 2.94*  CALCIUM 8.4*  8.4* 8.0* 8.0* 8.7* 8.5* 8.2* 8.6*  MG 2.3 2.2  --   --   --   --   --   PHOS 2.7  2.7 2.7 2.7 3.5  --  2.2* 3.6   GFR: Estimated Creatinine Clearance: 23 mL/min (by C-G formula based on Cr of 2.94). Liver Function Tests:  Recent Labs Lab 03/28/16 0405 03/29/16 0513 03/30/16 0845 03/31/16 1945 04/02/16 0438  ALBUMIN 2.6* 2.4* 2.7* 2.6* 2.6*   No results for input(s): LIPASE, AMYLASE in the last 168 hours. No results for input(s): AMMONIA in the last 168 hours. Coagulation Profile:  Recent Labs Lab 03/29/16 0500 03/30/16 0700 03/31/16 0537 04/01/16 0530 04/02/16 0444  INR 1.73* 1.62* 1.52* 1.47 1.42   Cardiac Enzymes: No results for input(s): CKTOTAL, CKMB, CKMBINDEX, TROPONINI in the last 168 hours. BNP (last 3 results) No results for input(s): PROBNP in the last 8760 hours. HbA1C: No results for input(s): HGBA1C in the last 72 hours. CBG:  Recent Labs Lab 04/01/16 1221 04/01/16 1536 04/01/16 2112 04/02/16 0825 04/02/16 1144  GLUCAP 147* 171* 127* 113* 165*   Lipid Profile: No results for input(s): CHOL, HDL, LDLCALC, TRIG, CHOLHDL, LDLDIRECT in the  last 72 hours. Thyroid Function Tests: No results for input(s): TSH, T4TOTAL, FREET4, T3FREE, THYROIDAB in the last 72 hours. Anemia Panel: No results for input(s): VITAMINB12, FOLATE, FERRITIN, TIBC, IRON, RETICCTPCT in the last 72 hours. Urine analysis:    Component Value Date/Time   COLORURINE AMBER* 03/14/2016 1655   APPEARANCEUR CLOUDY* 03/14/2016 1655   LABSPEC 1.019 03/14/2016 1655   PHURINE 5.0 03/14/2016 1655   GLUCOSEU NEGATIVE 03/14/2016 1655   HGBUR LARGE* 03/14/2016 1655   BILIRUBINUR SMALL* 03/14/2016 1655   KETONESUR 15* 03/14/2016 1655   PROTEINUR 30* 03/14/2016 1655   NITRITE NEGATIVE  03/14/2016 1655   LEUKOCYTESUR TRACE* 03/14/2016 1655    No results found for this or any previous visit (from the past 240 hour(s)).    Anti-infectives    Start     Dose/Rate Route Frequency Ordered Stop   04/03/16 0000  ceFAZolin (ANCEF) IVPB 1 g/50 mL premix    Comments:  Send with pt to OR   1 g 100 mL/hr over 30 Minutes Intravenous On call 04/02/16 0724 04/04/16 0000   03/29/16 1127  cefUROXime (ZINACEF) 1.5 g in dextrose 5 % 50 mL IVPB     1.5 g 100 mL/hr over 30 Minutes Intravenous To ShortStay Surgical 03/28/16 2305 03/30/16 1130   03/23/16 1400  vancomycin (VANCOCIN) 50 mg/mL oral solution 250 mg     250 mg Oral 4 times daily 03/23/16 1051 04/03/16 1759   03/20/16 1800  vancomycin (VANCOCIN) 50 mg/mL oral solution 125 mg  Status:  Discontinued     125 mg Oral 4 times daily 03/20/16 1637 03/23/16 1051   03/20/16 1230  metroNIDAZOLE (FLAGYL) IVPB 500 mg  Status:  Discontinued     500 mg 100 mL/hr over 60 Minutes Intravenous Every 8 hours 03/20/16 1218 03/20/16 1538       Radiology Studies: No results found.      Scheduled Meds: . antiseptic oral rinse  7 mL Mouth Rinse BID  . atorvastatin  10 mg Oral Daily  . budesonide (PULMICORT) nebulizer solution  0.5 mg Nebulization BID  . carvedilol  3.125 mg Oral BID WC  . [START ON 04/03/2016]  ceFAZolin (ANCEF) IV  1  g Intravenous On Call  . famotidine  20 mg Oral Daily  . insulin aspart  0-20 Units Subcutaneous TID WC  . insulin aspart  0-5 Units Subcutaneous QHS  . insulin aspart  4 Units Subcutaneous TID WC  . multivitamin  1 tablet Oral QHS  . vancomycin  250 mg Oral QID   Continuous Infusions: . heparin 1,750 Units/hr (04/02/16 1133)     LOS: 13 days    Time spent: 25 min    Brnadon Eoff Juanetta Gosling, DO [facility] Pager [phone]  If 7PM-7AM, please contact night-coverage [email or web address] Password [email or web address] 04/02/2016, 1:08 PM

## 2016-04-02 NOTE — Progress Notes (Addendum)
ANTICOAGULATION CONSULT NOTE - Follow Up Consult  Pharmacy Consult for Heparin Indication: hx DVT  Allergies  Allergen Reactions  . Entresto [Sacubitril-Valsartan] Other (See Comments)    On MAR    Patient Measurements: Height: 5\' 4"  (162.6 cm) Weight: 240 lb 8.4 oz (109.1 kg) IBW/kg (Calculated) : 54.7 Heparin Dosing Weight: 80kg  Vital Signs: Temp: 97.7 F (36.5 C) (05/07 0900) Temp Source: Oral (05/07 0900) BP: 109/59 mmHg (05/07 0900) Pulse Rate: 72 (05/07 0900)  Labs:  Recent Labs  03/31/16 0537 03/31/16 1945 03/31/16 2000 04/01/16 0530  04/01/16 1330 04/01/16 1942 04/02/16 0438 04/02/16 0444 04/02/16 1611  HGB 8.3*  --  7.5*  --   --  7.6*  --   --  7.4*  --   HCT 25.9*  --  23.3*  --   --  23.6*  --   --  23.2*  --   PLT 124*  --  126*  --   --  142*  --   --  149*  --   LABPROT 18.3*  --   --  17.9*  --   --   --   --  17.4*  --   INR 1.52*  --   --  1.47  --   --   --   --  1.42  --   HEPARINUNFRC 0.47  --   --   --   < >  --  0.26*  --  0.24* 0.41  CREATININE 2.36* 1.62*  --   --   --   --   --  2.94*  --   --   < > = values in this interval not displayed.  Estimated Creatinine Clearance: 23 mL/min (by C-G formula based on Cr of 2.94).  Assessment: 65 yo F on warfarin pta for recent RIJ DVT. INR on admit supratherapeutic - warfarin was held and reversed for central line and temp HD cath placement. Warfarin then resumed 4/28 and now on hold for permanent HD cath placement. Pharmacy asked to begin heparin once INR < 2 and Heparin was started 5/2.  Planning permanent HD cath once INR < 1.3 (vitamin K 5mg  po given 5/4 and 10mg  po given 5/5, 5 mg po given 5/7).  Heparin level this evening is therapeutic (HL 0.41 << 0.24, goal of 0.3-0.7) Hgb 7.4, plts 149. FOB noted to be positive - MD aware and to continue with heparin drip for now. RN will continue to watch.   Goal of Therapy:  Heparin level 0.3-0.7 units/ml Monitor platelets by anticoagulation protocol:  Yes   Plan:  1. Continue Heparin @ 1750 units/hr (17.5 ml/hr) 2. Will continue to monitor for any signs/symptoms of bleeding and will follow up with heparin level in the AM to confirm therapeutic  Georgina Pillion, PharmD, BCPS Clinical Pharmacist Pager: [phone] 04/02/2016 4:58 PM

## 2016-04-02 NOTE — Progress Notes (Signed)
ANTICOAGULATION CONSULT NOTE - Follow Up Consult  Pharmacy Consult for Heparin Indication: hx DVT  Allergies  Allergen Reactions  . Entresto [Sacubitril-Valsartan] Other (See Comments)    On MAR    Patient Measurements: Height: 5\' 4"  (162.6 cm) Weight: 240 lb 8.4 oz (109.1 kg) IBW/kg (Calculated) : 54.7 Heparin Dosing Weight: 80kg  Vital Signs: Temp: 97.6 F (36.4 C) (05/07 0525) Temp Source: Oral (05/07 0525) BP: 106/57 mmHg (05/07 0525) Pulse Rate: 76 (05/07 0525)  Labs:  Recent Labs  03/31/16 0537 03/31/16 1945 03/31/16 2000 04/01/16 0530 04/01/16 1150 04/01/16 1330 04/01/16 1942 04/02/16 0438 04/02/16 0444  HGB 8.3*  --  7.5*  --   --  7.6*  --   --  7.4*  HCT 25.9*  --  23.3*  --   --  23.6*  --   --  23.2*  PLT 124*  --  126*  --   --  142*  --   --  149*  LABPROT 18.3*  --   --  17.9*  --   --   --   --  17.4*  INR 1.52*  --   --  1.47  --   --   --   --  1.42  HEPARINUNFRC 0.47  --   --   --  0.22*  --  0.26*  --  0.24*  CREATININE 2.36* 1.62*  --   --   --   --   --  2.94*  --     Estimated Creatinine Clearance: 23 mL/min (by C-G formula based on Cr of 2.94).  Assessment: 65 yo F on warfarin pta for recent RIJ DVT. INR on admit supratherapeutic - warfarin was held and reversed for central line and temp HD cath placement. Warfarin then resumed 4/28 and now on hold for permanent HD cath placement. Pharmacy asked to begin heparin once INR < 2 and Heparin was started 5/2.  Planning permanent HD cath once INR < 1.3 (vitamin K 5mg  po given 5/4 and 10mg  po given 5/5).  Heparin level this morning 0.24 after most rate increase to 1450 units/hr. FOB noted to be positive on 05/06 - MD aware and to continue with heparin drip for now. RN will continue to watch.  CBC stable with no reported bleeding.  Goal of Therapy:  Heparin level 0.3-0.7 units/ml Monitor platelets by anticoagulation protocol: Yes   Plan:  1. Increase heparin drip rate to 1750 units/hr (17.5  ml/hr) 2. Will continue to monitor for any signs/symptoms of bleeding and will follow up with heparin level in 8 hours   Pollyann Samples, PharmD, BCPS 04/02/2016, 6:54 AM Pager: [phone]

## 2016-04-02 NOTE — Progress Notes (Signed)
Pt is a 65 y.o. yo female who was admitted on 03/20/2016 with Dudley Diff colitis and Acute respiratory distress/ AoCHF   1. AKI/CKD in the setting of decompensated biventricular CHF: Off CVVHD. IHD initiated 5/1 with last HD 5/5. Right femoral dialysis catheter removed 5/6.Marland Kitchen Encouraged getting out of bed after catheter removal. Vascular surgery consulted concerning perm cath and AV access. Vascular surgery waiting for INR to be <1.3, currently 1.42. Vein mapping on 5/1. Cr 2.94 today, baseline 1.8-2. Continue OP CLIP process.  2. Volume/ Pressure: BP stable. Off pressors.  3. Anemia/Thrombocytopenia: Hgb 7.4, Platelets 149 down from 165 at admission.  4. H/o DVT - INR 1.42. Pharmacy following/ management per primary 5. GI bleed- Reports blood in stool, management per primary 6. CAD/iCM/CHF- cards managing. 7. DM2: SSI per primary 8. Dudley Diff colitis- on Vanc PO per primary.  Renal Attending: I agree with above. She is a new start ESRD pt with cardiorenal exacerbation..  She is now without HD catheter after it was removed from her thigh Friday.   We anticipate an AVF and [facility] Monday.   She has OP HD scheduled for TTS at [facility] to arrive at 10:45. Brandy Dudley   Subjective: No acute concerns today. Awaiting INR to lower so dialysis catheter can be placed. Notes blood in stool since yesterday, states minimal amount. Reports history of diarrhea x3 weeks.  Objective: Vital signs in last 24 hours: Temp:  [97.6 F (36.4 Dudley)-99.1 F (37.3 Dudley)] 97.6 F (36.4 Dudley) (05/07 0525) Pulse Rate:  [66-76] 76 (05/07 0525) Resp:  [16-18] 18 (05/07 0525) BP: (92-106)/(50-59) 106/57 mmHg (05/07 0525) SpO2:  [98 %-100 %] 98 % (05/07 0844) Weight:  [240 lb 8.4 oz (109.1 kg)] 240 lb 8.4 oz (109.1 kg) (05/07 0525) Weight change: 8 lb 6 oz (3.8 kg)  Intake/Output from previous day: 05/06 0701 - 05/07 0700 In: 845.1 [P.O.:560; I.V.:285.1] Out: 0  Intake/Output this shift:    General appearance: alert and  cooperative GI: soft, non-tender; bowel sounds normal; no masses,  no organomegaly Extremities: edema trace  Lab Results:  Recent Labs  04/01/16 1330 04/02/16 0444  WBC 8.1 7.9  HGB 7.6* 7.4*  HCT 23.6* 23.2*  PLT 142* 149*   BMET:   Recent Labs  03/31/16 1945 04/02/16 0438  NA 137 136  K 3.2* 3.8  CL 99* 99*  CO2 28 26  GLUCOSE 161* 113*  BUN 15 27*  CREATININE 1.62* 2.94*  CALCIUM 8.2* 8.6*   No results for input(s): PTH in the last 72 hours. Iron Studies: No results for input(s): IRON, TIBC, TRANSFERRIN, FERRITIN in the last 72 hours. Studies/Results: No results found.  Scheduled: . antiseptic oral rinse  7 mL Mouth Rinse BID  . atorvastatin  10 mg Oral Daily  . budesonide (PULMICORT) nebulizer solution  0.5 mg Nebulization BID  . carvedilol  3.125 mg Oral BID WC  . [START ON 04/03/2016]  ceFAZolin (ANCEF) IV  1 g Intravenous On Call  . famotidine  20 mg Oral Daily  . insulin aspart  0-20 Units Subcutaneous TID WC  . insulin aspart  0-5 Units Subcutaneous QHS  . insulin aspart  4 Units Subcutaneous TID WC  . multivitamin  1 tablet Oral QHS  . phytonadione (VITAMIN K) IV  5 mg Intravenous Once  . vancomycin  250 mg Oral QID     LOS: 13 days   [facility] 04/02/2016,10:24 AM

## 2016-04-03 ENCOUNTER — Inpatient Hospital Stay (HOSPITAL_COMMUNITY): Payer: Medicare PPO | Admitting: Anesthesiology

## 2016-04-03 ENCOUNTER — Encounter (HOSPITAL_COMMUNITY): Payer: Self-pay | Admitting: Anesthesiology

## 2016-04-03 ENCOUNTER — Encounter (HOSPITAL_COMMUNITY)
Admission: EM | Disposition: A | Discharge status: Skilled Nursing Facility | Payer: Self-pay | Source: Home / Self Care | Attending: Internal Medicine

## 2016-04-03 DIAGNOSIS — N185 Chronic kidney disease, stage 5: Secondary | ICD-10-CM

## 2016-04-03 HISTORY — PX: INSERTION OF DIALYSIS CATHETER: SHX1324

## 2016-04-03 HISTORY — PX: AV FISTULA PLACEMENT: SHX1204

## 2016-04-03 LAB — BASIC METABOLIC PANEL
ANION GAP: 12 (ref 5–15)
BUN: 36 mg/dL — AB (ref 6–20)
CALCIUM: 8.8 mg/dL — AB (ref 8.9–10.3)
CO2: 24 mmol/L (ref 22–32)
Chloride: 99 mmol/L — ABNORMAL LOW (ref 101–111)
Creatinine, Ser: 3.59 mg/dL — ABNORMAL HIGH (ref 0.44–1.00)
GFR calc non Af Amer: 12 mL/min — ABNORMAL LOW (ref >60)
GFR, EST AFRICAN AMERICAN: 14 mL/min — AB (ref >60)
Glucose, Bld: 150 mg/dL — ABNORMAL HIGH (ref 65–99)
POTASSIUM: 4.2 mmol/L (ref 3.5–5.1)
SODIUM: 135 mmol/L (ref 135–145)

## 2016-04-03 LAB — GLUCOSE, CAPILLARY
GLUCOSE-CAPILLARY: 138 mg/dL — AB (ref 65–99)
GLUCOSE-CAPILLARY: 146 mg/dL — AB (ref 65–99)
GLUCOSE-CAPILLARY: 127 mg/dL — AB (ref 65–99)
GLUCOSE-CAPILLARY: 100 mg/dL — AB (ref 65–99)

## 2016-04-03 LAB — MAGNESIUM: MAGNESIUM: 2.1 mg/dL (ref 1.7–2.4)

## 2016-04-03 LAB — CBC
HCT: 24.1 % — ABNORMAL LOW (ref 36.0–46.0)
Hemoglobin: 7.8 g/dL — ABNORMAL LOW (ref 12.0–15.0)
MCH: 28.8 pg (ref 26.0–34.0)
MCHC: 32.4 g/dL (ref 30.0–36.0)
MCV: 88.9 fL (ref 78.0–100.0)
PLATELETS: 169 10*3/uL (ref 150–400)
RBC: 2.71 MIL/uL — AB (ref 3.87–5.11)
RDW: 18.6 % — AB (ref 11.5–15.5)
WBC: 7.5 10*3/uL (ref 4.0–10.5)

## 2016-04-03 LAB — PROTIME-INR
INR: 1.41 (ref 0.00–1.49)
Prothrombin Time: 17.4 seconds — ABNORMAL HIGH (ref 11.6–15.2)

## 2016-04-03 LAB — HEPARIN LEVEL (UNFRACTIONATED): Heparin Unfractionated: 0.37 IU/mL (ref 0.30–0.70)

## 2016-04-03 SURGERY — INSERTION OF DIALYSIS CATHETER
Anesthesia: General | Site: Groin | Laterality: Left

## 2016-04-03 MED ORDER — DARBEPOETIN ALFA 100 MCG/0.5ML IJ SOSY
100.0000 ug | PREFILLED_SYRINGE | INTRAMUSCULAR | Status: DC
  Administered 2016-04-04: 100 ug via INTRAVENOUS

## 2016-04-03 MED ORDER — PHENOL 1.4 % MT LIQD: 1.0000 | OROMUCOSAL | Status: DC | PRN

## 2016-04-03 MED ORDER — LIDOCAINE HCL (CARDIAC) 20 MG/ML IV SOLN
INTRAVENOUS | Status: DC | PRN
  Administered 2016-04-03: 100 mg via INTRAVENOUS

## 2016-04-03 MED ORDER — SUGAMMADEX SODIUM 200 MG/2ML IV SOLN
INTRAVENOUS | Status: AC
  Filled 2016-04-03: qty 2

## 2016-04-03 MED ORDER — NALOXONE HCL 0.4 MG/ML IJ SOLN
INTRAMUSCULAR | Status: AC
  Filled 2016-04-03: qty 1

## 2016-04-03 MED ORDER — SODIUM CHLORIDE 0.9 % IV SOLN
INTRAVENOUS | Status: DC | PRN
  Administered 2016-04-03: 12:00:00

## 2016-04-03 MED ORDER — PROPOFOL 10 MG/ML IV BOLUS
INTRAVENOUS | Status: DC | PRN
  Administered 2016-04-03: 80 mg via INTRAVENOUS

## 2016-04-03 MED ORDER — ONDANSETRON HCL 4 MG/2ML IJ SOLN
INTRAMUSCULAR | Status: DC | PRN
  Administered 2016-04-03: 4 mg via INTRAVENOUS

## 2016-04-03 MED ORDER — FENTANYL CITRATE (PF) 100 MCG/2ML IJ SOLN: 25.0000 ug | INTRAMUSCULAR | Status: DC | PRN

## 2016-04-03 MED ORDER — PHENYLEPHRINE HCL 10 MG/ML IJ SOLN
INTRAMUSCULAR | Status: DC | PRN
  Administered 2016-04-03: 80 ug via INTRAVENOUS

## 2016-04-03 MED ORDER — MIDAZOLAM HCL 5 MG/5ML IJ SOLN
INTRAMUSCULAR | Status: DC | PRN
  Administered 2016-04-03: 2 mg via INTRAVENOUS

## 2016-04-03 MED ORDER — ONDANSETRON HCL 4 MG/2ML IJ SOLN: 4.0000 mg | Freq: Once | INTRAMUSCULAR | Status: DC | PRN

## 2016-04-03 MED ORDER — SUGAMMADEX SODIUM 200 MG/2ML IV SOLN
INTRAVENOUS | Status: DC | PRN
  Administered 2016-04-03: 200 mg via INTRAVENOUS

## 2016-04-03 MED ORDER — ONDANSETRON HCL 4 MG/2ML IJ SOLN
INTRAMUSCULAR | Status: AC
  Filled 2016-04-03: qty 2

## 2016-04-03 MED ORDER — HEPARIN SODIUM (PORCINE) 1000 UNIT/ML IJ SOLN
INTRAMUSCULAR | Status: AC
  Filled 2016-04-03: qty 1

## 2016-04-03 MED ORDER — FENTANYL CITRATE (PF) 250 MCG/5ML IJ SOLN
INTRAMUSCULAR | Status: AC
  Filled 2016-04-03: qty 5

## 2016-04-03 MED ORDER — MIDAZOLAM HCL 2 MG/2ML IJ SOLN
INTRAMUSCULAR | Status: AC
  Filled 2016-04-03: qty 2

## 2016-04-03 MED ORDER — FENTANYL CITRATE (PF) 100 MCG/2ML IJ SOLN
INTRAMUSCULAR | Status: DC | PRN
  Administered 2016-04-03: 100 ug via INTRAVENOUS
  Administered 2016-04-03: 50 ug via INTRAVENOUS

## 2016-04-03 MED ORDER — HEPARIN SODIUM (PORCINE) 1000 UNIT/ML IJ SOLN
INTRAMUSCULAR | Status: DC | PRN
  Administered 2016-04-03: 6 mL via INTRAVENOUS

## 2016-04-03 MED ORDER — ARTIFICIAL TEARS OP OINT
TOPICAL_OINTMENT | OPHTHALMIC | Status: DC | PRN
Start: 2016-04-03 — End: 2016-04-03
  Administered 2016-04-03: 1 via OPHTHALMIC

## 2016-04-03 MED ORDER — DEXTROSE 5 % IV SOLN
INTRAVENOUS | Status: DC | PRN
  Administered 2016-04-03: 12:00:00 via INTRAVENOUS

## 2016-04-03 MED ORDER — FLUMAZENIL 0.5 MG/5ML IV SOLN
INTRAVENOUS | Status: AC
  Filled 2016-04-03: qty 5

## 2016-04-03 MED ORDER — SODIUM CHLORIDE 0.9 % IV SOLN
10.0000 mg | INTRAVENOUS | Status: DC | PRN
  Administered 2016-04-03: 10 ug/min via INTRAVENOUS

## 2016-04-03 MED ORDER — 0.9 % SODIUM CHLORIDE (POUR BTL) OPTIME
TOPICAL | Status: DC | PRN
  Administered 2016-04-03: 1000 mL

## 2016-04-03 MED ORDER — SUCCINYLCHOLINE CHLORIDE 20 MG/ML IJ SOLN
INTRAMUSCULAR | Status: DC | PRN
Start: 2016-04-03 — End: 2016-04-03
  Administered 2016-04-03: 100 mg via INTRAVENOUS

## 2016-04-03 MED ORDER — SODIUM CHLORIDE 0.9 % IV SOLN: Freq: Once | INTRAVENOUS | Status: DC

## 2016-04-03 MED ORDER — ROCURONIUM BROMIDE 100 MG/10ML IV SOLN
INTRAVENOUS | Status: DC | PRN
  Administered 2016-04-03: 30 mg via INTRAVENOUS

## 2016-04-03 MED ORDER — SODIUM CHLORIDE 0.9 % IV SOLN
INTRAVENOUS | Status: DC
  Administered 2016-04-03: 09:00:00 via INTRAVENOUS

## 2016-04-03 MED ORDER — PROPOFOL 10 MG/ML IV BOLUS
INTRAVENOUS | Status: AC
  Filled 2016-04-03: qty 20

## 2016-04-03 SURGICAL SUPPLY — 48 items
ARMBAND PINK RESTRICT EXTREMIT (MISCELLANEOUS) ×4 IMPLANT
BAG BANDED W/RUBBER/TAPE 36X54 (MISCELLANEOUS) ×4 IMPLANT
BAG DECANTER FOR FLEXI CONT (MISCELLANEOUS) ×4 IMPLANT
BENZOIN TINCTURE PRP APPL 2/3 (GAUZE/BANDAGES/DRESSINGS) ×8 IMPLANT
BIOPATCH RED 1 DISK 7.0 (GAUZE/BANDAGES/DRESSINGS) ×3 IMPLANT
BIOPATCH RED 1IN DISK 7.0MM (GAUZE/BANDAGES/DRESSINGS) ×1
CANISTER SUCTION 2500CC (MISCELLANEOUS) ×4 IMPLANT
CANNULA VESSEL 3MM 2 BLNT TIP (CANNULA) ×8 IMPLANT
CATH PALINDROME REV 55CM (CATHETERS) ×4 IMPLANT
CLIP LIGATING EXTRA MED SLVR (CLIP) ×4 IMPLANT
CLIP LIGATING EXTRA SM BLUE (MISCELLANEOUS) ×4 IMPLANT
CLOSURE STERI-STRIP 1/2X4 (GAUZE/BANDAGES/DRESSINGS) ×1
CLOSURE WOUND 1/2 X4 (GAUZE/BANDAGES/DRESSINGS) ×1
CLSR STERI-STRIP ANTIMIC 1/2X4 (GAUZE/BANDAGES/DRESSINGS) ×3 IMPLANT
COVER PROBE W GEL 5X96 (DRAPES) ×4 IMPLANT
DECANTER SPIKE VIAL GLASS SM (MISCELLANEOUS) ×4 IMPLANT
DRAPE C-ARM 42X72 X-RAY (DRAPES) ×4 IMPLANT
DRAPE CHEST BREAST 15X10 FENES (DRAPES) ×8 IMPLANT
ELECT REM PT RETURN 9FT ADLT (ELECTROSURGICAL) ×4
ELECTRODE REM PT RTRN 9FT ADLT (ELECTROSURGICAL) ×2 IMPLANT
GAUZE SPONGE 2X2 8PLY NS (GAUZE/BANDAGES/DRESSINGS) ×4 IMPLANT
GAUZE SPONGE 4X4 12PLY STRL (GAUZE/BANDAGES/DRESSINGS) ×4 IMPLANT
GAUZE SPONGE 4X4 16PLY XRAY LF (GAUZE/BANDAGES/DRESSINGS) ×4 IMPLANT
GLOVE SS BIOGEL STRL SZ 7.5 (GLOVE) ×2 IMPLANT
GLOVE SUPERSENSE BIOGEL SZ 7.5 (GLOVE) ×2
GOWN STRL REUS W/ TWL LRG LVL3 (GOWN DISPOSABLE) ×6 IMPLANT
GOWN STRL REUS W/TWL LRG LVL3 (GOWN DISPOSABLE) ×6
KIT BASIN OR (CUSTOM PROCEDURE TRAY) ×4 IMPLANT
KIT ROOM TURNOVER OR (KITS) ×4 IMPLANT
NEEDLE 18GX1X1/2 (RX/OR ONLY) (NEEDLE) ×4 IMPLANT
NEEDLE 22X1 1/2 (OR ONLY) (NEEDLE) ×4 IMPLANT
NEEDLE HYPO 25GX1X1/2 BEV (NEEDLE) ×4 IMPLANT
NS IRRIG 1000ML POUR BTL (IV SOLUTION) ×4 IMPLANT
PACK CV ACCESS (CUSTOM PROCEDURE TRAY) ×4 IMPLANT
PAD ARMBOARD 7.5X6 YLW CONV (MISCELLANEOUS) ×8 IMPLANT
SOAP 2 % CHG 4 OZ (WOUND CARE) ×4 IMPLANT
STRIP CLOSURE SKIN 1/2X4 (GAUZE/BANDAGES/DRESSINGS) ×3 IMPLANT
SUT ETHILON 3 0 PS 1 (SUTURE) ×4 IMPLANT
SUT PROLENE 6 0 CC (SUTURE) ×16 IMPLANT
SUT VIC AB 3-0 SH 27 (SUTURE) ×2
SUT VIC AB 3-0 SH 27X BRD (SUTURE) ×2 IMPLANT
SUT VICRYL 4-0 PS2 18IN ABS (SUTURE) ×4 IMPLANT
SYR 20CC LL (SYRINGE) ×4 IMPLANT
SYR 5ML LL (SYRINGE) ×8 IMPLANT
SYRINGE 10CC LL (SYRINGE) ×4 IMPLANT
TAPE CLOTH SURG 4X10 WHT LF (GAUZE/BANDAGES/DRESSINGS) ×4 IMPLANT
UNDERPAD 30X30 INCONTINENT (UNDERPADS AND DIAPERS) ×4 IMPLANT
WATER STERILE IRR 1000ML POUR (IV SOLUTION) ×4 IMPLANT

## 2016-04-03 NOTE — Progress Notes (Signed)
PROGRESS NOTE    Brandy Dudley  GNO:[id number] DOB: [birth year] DOA: 03/20/2016 PCP: PROVIDER NOT IN SYSTEM   Outpatient Specialists:     Brief Narrative:  Brandy Dudley is a 65 y.o. admitted from [place] [facility] to IM service per notes for further evaluation for C. Diff colitis, Acute CHF exacerbation, and ARF on CKD stage III. HF team consulted to assist with fluid management.  Found to need HD  And awaiting INR of < 1.3 for perm cath and AV fistula   Assessment & Plan:   Principal Problem:   Acute on chronic congestive heart failure (HCC) Active Problems:   Physical deconditioning   DVT (deep venous thrombosis), right   Chronic systolic congestive heart failure (HCC)   Cardiomyopathy, ischemic   Coronary artery disease involving native coronary artery of native heart without angina pectoris   Essential hypertension, malignant   Diabetes mellitus type 2 in obese (HCC)   C. difficile colitis   COPD (chronic obstructive pulmonary disease) (HCC)   Chest wall abscess   Obstructive sleep apnea   AICD (automatic cardioverter/defibrillator) present   C. difficile diarrhea   Chronic kidney disease (CKD), stage IV (severe) (HCC)   AKI (acute kidney injury) (HCC)   Respiratory failure (HCC)   Bloody diarrhea   Anasarca   ESRD on dialysis (HCC)   Pressure ulcer    C difficile colitis -on clindamycin recently for MRSA breast abscess - po vancomycin til 5/8  Acute on chronic systolic CHF: Ischemic cardiomyopathy with history of prior CAD  -Echo 03/21/16 35-40% with Grade 2 DD, mod dilated RV, mild LAE, mod RAE, Severe TR, Peak PA 38 mm Hg. - Continue coreg 3.125 mg twice a day. Hold SBP <85. -appreciate HF team   AKI on CKD: Now requiring RRT -appreciate Dr. Sinclair Ship IJ DVT in 3/17: INR 1.7 , need 1.3 for dialysis catheter placement. Covering with heparin while INR < 2 and coumadin on hold.   CAD: History not available. On ASA + Plavix in the past. This was held due to  hematuria at last admission (foley trauma) and need to use warfarin. Continue atorvastatin.      Anemia: Had blood in stool, likely related to C difficile colitis and high INR. -Hgb trending down, re-check stool for blood   Immobility- PT following -SNF eventually (from [facility])    DVT prophylaxis:  Heparin while INR < 2  Code Status: Full Code   Family Communication: No family at bedside  Disposition Plan:  From [facility]-- back when medically ready-- not before next week I suspect   Consultants:   CHF team  PCCM  Procedures:      Subjective: No further blood in stool C/o sore throat   Objective: Filed Vitals:   04/03/16 1500 04/03/16 1511 04/03/16 1520 04/03/16 1545  BP:  93/62  92/53  Pulse: 69 75 70 70  Temp:   97.7 F (36.5 C) 98 F (36.7 C)  TempSrc:    Oral  Resp: 22 27 23 16   Height:      Weight:      SpO2: 96% 100% 97% 98%    Intake/Output Summary (Last 24 hours) at 04/03/16 1621 Last data filed at 04/03/16 1400  Gross per 24 hour  Intake   1220 ml  Output     10 ml  Net   1210 ml   Filed Weights   04/01/16 0450 04/02/16 0525 04/02/16 2151  Weight: 106.6 kg (235 lb 0.2 oz)  109.1 kg (240 lb 8.4 oz) 109.1 kg (240 lb 8.4 oz)    Examination:  General exam: Appears calm and comfortable  Respiratory system: Clear to auscultation. Respiratory effort normal. Cardiovascular system: S1 & S2 heard, RRR. +edema Gastrointestinal system: Abdomen is nondistended, soft and nontender. No organomegaly or masses felt. Normal bowel sounds heard. Central nervous system: Alert and oriented. No focal neurological deficits. Extremities: weak Psychiatry: Judgement and insight appear normal. Mood & affect appropriate.     Data Reviewed: I have personally reviewed following labs and imaging studies  CBC:  Recent Labs Lab 03/31/16 0537 03/31/16 2000 04/01/16 1330 04/02/16 0444 04/03/16 0434  WBC 8.3 10.2 8.1 7.9 7.5  HGB 8.3* 7.5*  7.6* 7.4* 7.8*  HCT 25.9* 23.3* 23.6* 23.2* 24.1*  MCV 88.7 88.3 88.4 88.9 88.9  PLT 124* 126* 142* 149* 169   Basic Metabolic Panel:  Recent Labs Lab 03/28/16 0405 03/29/16 0410 03/29/16 0513 03/30/16 0845 03/31/16 0537 03/31/16 1945 04/02/16 0438 04/03/16 0434  NA 138  138 137 138 139 138 137 136 135  K 3.3*  3.3* 3.5 3.6 3.6 4.0 3.2* 3.8 4.2  CL 102  102 102 104 104 103 99* 99* 99*  CO2 GLUCOSE 176*  179* 238* 242* 156* 159* 161* 113* 150*  BUN 32*  32* 26* 26* 34* 24* 15 27* 36*  CREATININE 3.19*  3.18* 2.66* 2.72* 2.85* 2.36* 1.62* 2.94* 3.59*  CALCIUM 8.4*  8.4* 8.0* 8.0* 8.7* 8.5* 8.2* 8.6* 8.8*  MG 2.3 2.2  --   --   --   --   --   --   PHOS 2.7  2.7 2.7 2.7 3.5  --  2.2* 3.6  --    GFR: Estimated Creatinine Clearance: 18.9 mL/min (by C-G formula based on Cr of 3.59). Liver Function Tests:  Recent Labs Lab 03/28/16 0405 03/29/16 0513 03/30/16 0845 03/31/16 1945 04/02/16 0438  ALBUMIN 2.6* 2.4* 2.7* 2.6* 2.6*   No results for input(s): LIPASE, AMYLASE in the last 168 hours. No results for input(s): AMMONIA in the last 168 hours. Coagulation Profile:  Recent Labs Lab 03/30/16 0700 03/31/16 0537 04/01/16 0530 04/02/16 0444 04/03/16 0434  INR 1.62* 1.52* 1.47 1.42 1.41   Cardiac Enzymes: No results for input(s): CKTOTAL, CKMB, CKMBINDEX, TROPONINI in the last 168 hours. BNP (last 3 results) No results for input(s): PROBNP in the last 8760 hours. HbA1C: No results for input(s): HGBA1C in the last 72 hours. CBG:  Recent Labs Lab 04/02/16 1144 04/02/16 1645 04/02/16 2147 04/03/16 0815 04/03/16 1401  GLUCAP 165* 143* 177* 138* 146*   Lipid Profile: No results for input(s): CHOL, HDL, LDLCALC, TRIG, CHOLHDL, LDLDIRECT in the last 72 hours. Thyroid Function Tests: No results for input(s): TSH, T4TOTAL, FREET4, T3FREE, THYROIDAB in the last 72 hours. Anemia Panel: No results for input(s): VITAMINB12,  FOLATE, FERRITIN, TIBC, IRON, RETICCTPCT in the last 72 hours. Urine analysis:    Component Value Date/Time   COLORURINE AMBER* 03/14/2016 1655   APPEARANCEUR CLOUDY* 03/14/2016 1655   LABSPEC 1.019 03/14/2016 1655   PHURINE 5.0 03/14/2016 1655   GLUCOSEU NEGATIVE 03/14/2016 1655   HGBUR LARGE* 03/14/2016 1655   BILIRUBINUR SMALL* 03/14/2016 1655   KETONESUR 15* 03/14/2016 1655   PROTEINUR 30* 03/14/2016 1655   NITRITE NEGATIVE 03/14/2016 1655   LEUKOCYTESUR TRACE* 03/14/2016 1655    No results found for this or any previous visit (from the past 240 hour(s)).  Anti-infectives    Start     Dose/Rate Route Frequency Ordered Stop   04/03/16 0000  ceFAZolin (ANCEF) IVPB 1 g/50 mL premix  Status:  Discontinued    Comments:  Send with pt to OR   1 g 100 mL/hr over 30 Minutes Intravenous On call 04/02/16 0724 04/02/16 1350   04/03/16 0000  ceFAZolin (ANCEF) IVPB 2g/100 mL premix    Comments:  Send with pt to OR   2 g 200 mL/hr over 30 Minutes Intravenous On call 04/02/16 1350 04/03/16 1200   03/29/16 1127  cefUROXime (ZINACEF) 1.5 g in dextrose 5 % 50 mL IVPB     1.5 g 100 mL/hr over 30 Minutes Intravenous To ShortStay Surgical 03/28/16 2305 03/30/16 1130   03/23/16 1400  vancomycin (VANCOCIN) 50 mg/mL oral solution 250 mg     250 mg Oral 4 times daily 03/23/16 1051 04/03/16 1759   03/20/16 1800  vancomycin (VANCOCIN) 50 mg/mL oral solution 125 mg  Status:  Discontinued     125 mg Oral 4 times daily 03/20/16 1637 03/23/16 1051   03/20/16 1230  metroNIDAZOLE (FLAGYL) IVPB 500 mg  Status:  Discontinued     500 mg 100 mL/hr over 60 Minutes Intravenous Every 8 hours 03/20/16 1218 03/20/16 1538       Radiology Studies: No results found.      Scheduled Meds: . sodium chloride   Intravenous Once  . antiseptic oral rinse  7 mL Mouth Rinse BID  . atorvastatin  10 mg Oral Daily  . budesonide (PULMICORT) nebulizer solution  0.5 mg Nebulization BID  . carvedilol  3.125 mg Oral  BID WC  . famotidine  20 mg Oral Daily  . insulin aspart  0-20 Units Subcutaneous TID WC  . insulin aspart  0-5 Units Subcutaneous QHS  . insulin aspart  4 Units Subcutaneous TID WC  . multivitamin  1 tablet Oral QHS  . vancomycin  250 mg Oral QID   Continuous Infusions: . sodium chloride 10 mL/hr at 04/03/16 0905  . heparin 1,750 Units/hr (04/03/16 1620)     LOS: 14 days    Time spent: 25 min    Garrett Bowring Juanetta Gosling, DO [facility] Pager [phone]  If 7PM-7AM, please contact night-coverage [email or web address] Password [email or web address] 04/03/2016, 4:21 PM

## 2016-04-03 NOTE — Progress Notes (Signed)
ANTICOAGULATION CONSULT NOTE   Pharmacy Consult for Heparin Indication: hx DVT  Allergies  Allergen Reactions  . Entresto [Sacubitril-Valsartan] Other (See Comments)    On MAR    Patient Measurements: Height: 5\' 4"  (162.6 cm) Weight: 240 lb 8.4 oz (109.1 kg) IBW/kg (Calculated) : 54.7 Heparin Dosing Weight: 80kg  Vital Signs: Temp: 97.5 F (36.4 C) (05/08 0610) Temp Source: Oral (05/08 0610) BP: 108/60 mmHg (05/08 0610) Pulse Rate: 74 (05/08 0610)  Labs:  Recent Labs  03/31/16 1945  04/01/16 0530  04/01/16 1330  04/02/16 0438 04/02/16 0444 04/02/16 1611 04/03/16 0434  HGB  --   < >  --   --  7.6*  --   --  7.4*  --  7.8*  HCT  --   < >  --   --  23.6*  --   --  23.2*  --  24.1*  PLT  --   < >  --   --  142*  --   --  149*  --  169  LABPROT  --   --  17.9*  --   --   --   --  17.4*  --  17.4*  INR  --   --  1.47  --   --   --   --  1.42  --  1.41  HEPARINUNFRC  --   --   --   < >  --   < >  --  0.24* 0.41 0.37  CREATININE 1.62*  --   --   --   --   --  2.94*  --   --  3.59*  < > = values in this interval not displayed.  Estimated Creatinine Clearance: 18.9 mL/min (by C-G formula based on Cr of 3.59).  Assessment: 65 yo F on warfarin pta for recent RIJ DVT. INR on admit supratherapeutic - warfarin was held and reversed for central line and temp HD cath placement. Warfarin then resumed 4/28 and now on hold for permanent HD cath placement. Pharmacy asked to begin heparin once INR < 2 and Heparin was started 5/2.  Planning permanent HD cath once INR < 1.3 (vitamin K 5mg  po given 5/4 and 10mg  po given 5/5, 5 mg po given 5/7).  Heparin level this morning is therapeutic at 0.37 < 0.51.  Hgb 7.8, plts 169. FOB noted to be positive - MD aware and to continue with heparin drip for now. RN will continue to watch.   Goal of Therapy:  Heparin level 0.3-0.7 units/ml Monitor platelets by anticoagulation protocol: Yes   Plan:  1. Continue Heparin @ 1750 units/hr (17.5  ml/hr) 2. Will continue to monitor for any signs/symptoms of bleeding and will follow up with a daily HL and CBC  .Pollyann Samples, PharmD, BCPS 04/03/2016, 6:37 AM Pager: [phone]

## 2016-04-03 NOTE — Progress Notes (Signed)
Arrived to PACU very sleepy, with runs of VT than back to NSR at 73.

## 2016-04-03 NOTE — Progress Notes (Signed)
Utilization review complete. Anyae Griffith RN CCM Case Mgmt phone [phone] 

## 2016-04-03 NOTE — Anesthesia Preprocedure Evaluation (Addendum)
Anesthesia Evaluation  Patient identified by MRN, date of birth, ID band Patient awake    Reviewed: Allergy & Precautions, NPO status , Patient's Chart, lab work & pertinent test results  Airway Mallampati: III  TM Distance: >3 FB Neck ROM: Full    Dental  (+) Dental Advisory Given, Edentulous Upper, Missing   Pulmonary sleep apnea , COPD,  COPD inhaler, former smoker,    Pulmonary exam normal breath sounds clear to auscultation       Cardiovascular hypertension, + CAD, +CHF and + DVT  Normal cardiovascular exam+ Cardiac Defibrillator  Rhythm:Regular Rate:Normal  Echo 03/21/16 35-40% with Grade 2 DD, mod dilated RV, mild LAE, mod RAE, Severe TR, Peak PA 38 mm Hg   Neuro/Psych negative neurological ROS  negative psych ROS   GI/Hepatic Neg liver ROS, GERD  Medicated,C. Diff colitis   Endo/Other  diabetes, Type 2, Insulin DependentMorbid obesity  Renal/GU ESRF and DialysisRenal diseaseDialysis 03/31/16     Musculoskeletal negative musculoskeletal ROS (+)   Abdominal   Peds  Hematology negative hematology ROS (+)   Anesthesia Other Findings Day of surgery medications reviewed with the patient.  Reproductive/Obstetrics                         Anesthesia Physical Anesthesia Plan  ASA: IV  Anesthesia Plan: General   Post-op Pain Management:    Induction: Intravenous  Airway Management Planned: LMA  Additional Equipment:   Intra-op Plan:   Post-operative Plan: Extubation in OR  Informed Consent: I have reviewed the patients History and Physical, chart, labs and discussed the procedure including the risks, benefits and alternatives for the proposed anesthesia with the patient or authorized representative who has indicated his/her understanding and acceptance.   Dental advisory given  Plan Discussed with: CRNA  Anesthesia Plan Comments: (Risks/benefits of general anesthesia discussed with  patient including risk of damage to teeth, lips, gum, and tongue, nausea/vomiting, allergic reactions to medications, and the possibility of heart attack, stroke and death.  All patient questions answered.  Patient wishes to proceed.)        Anesthesia Quick Evaluation

## 2016-04-03 NOTE — Progress Notes (Signed)
   Paged to PACU for ? Pauses and probably SVT.  Appears to have had several episodes of NSVT up to 14 beats.  Asymptomatic.   Will have ICD interrogated to make sure nothing longer was unnoticed or that she didn't have any therapy delivered (No shock noted).      K stable. Will check Mg.  Will see for full visit in am unless any further this pm.   Of note, both doses of Coreg held yesterday for SBP 107 and 105.  Should not hold for SBP > 90. Hold parameters placed.  Casimiro Needle [address] [place], PA-C 04/03/2016 2:54 PM

## 2016-04-03 NOTE — Progress Notes (Signed)
Leota Jacobsen with [employer] notified that patient is in short stay for surgery and states that if needed a magnet can be used and he will come check ICD after procedure.  Leota Jacobsen can be reached at [phone].

## 2016-04-03 NOTE — Progress Notes (Signed)
Patient ID: Brandy Dudley, female   DOB: [birth year], 65 y.o.   MRN: [id number] Hemodynamically stable in PACU. Has had some longer pauses and runs of probable SVT. Blood pressure stable with this. The ICD rep is on his way to interrogate her ICD. I also called cardiology, advanced heart failure team who is familiar with her to evaluate her as well.

## 2016-04-03 NOTE — H&P (View-Only) (Signed)
   Daily Progress Note  Waiting for INR<=1.3 for combined TDC exchange and L arm fistula placement (L RC vs BC AVF)  Lab Results  Component Value Date   INR 1.62* 03/30/2016   INR 1.73* 03/29/2016   INR 1.79* 03/28/2016    - will reschedule for tomorrow   Leonides Sake, MD [facility] Office: [phone] Pager: [phone]  03/30/2016, 9:59 AM

## 2016-04-03 NOTE — Anesthesia Procedure Notes (Signed)
Procedure Name: Intubation Date/Time: 04/03/2016 11:51 AM Performed by: Wray Kearns A Pre-anesthesia Checklist: Patient identified, Emergency Drugs available, Suction available, Patient being monitored and Timeout performed Patient Re-evaluated:Patient Re-evaluated prior to inductionOxygen Delivery Method: Circle system utilized Preoxygenation: Pre-oxygenation with 100% oxygen Intubation Type: IV induction and Cricoid Pressure applied Ventilation: Mask ventilation without difficulty Laryngoscope Size: Mac and 4 Grade View: Grade I Tube type: Oral Tube size: 7.5 mm Number of attempts: 1 Airway Equipment and Method: Stylet Placement Confirmation: ETT inserted through vocal cords under direct vision,  positive ETCO2 and breath sounds checked- equal and bilateral Secured at: 21 cm Tube secured with: Tape Dental Injury: Teeth and Oropharynx as per pre-operative assessment

## 2016-04-03 NOTE — Progress Notes (Signed)
Dr. W.Fitzgerald at bedside.

## 2016-04-03 NOTE — Progress Notes (Signed)
More arousable, answerrs to name and will say "I'm awake".

## 2016-04-03 NOTE — Care Management Note (Signed)
Case Management Note  Patient Details  Name: [name] MRN: [id number] Date of Birth: Nov 20, 1951  Subjective/Objective:     AKI, CHF, ESRD, New HD               Action/Plan: Discharge Planning: Chart reviewed. CSW following for SNF placement. New HD, CIip process for outpt HD pending.   Expected Discharge Date:                  Expected Discharge Plan:  Skilled Nursing Facility  In-House Referral:  Clinical Social Work  Discharge planning Services  CM Consult  Post Acute Care Choice:  NA Choice offered to:  NA  DME Arranged:  N/A DME Agency:  NA  HH Arranged:  NA HH Agency:  NA  Status of Service:  Completed, signed off  Medicare Important Message Given:  Yes Date Medicare IM Given:    Medicare IM give by:    Date Additional Medicare IM Given:    Additional Medicare Important Message give by:     If discussed at Long Length of Stay Meetings, dates discussed:    Additional Comments:  Elliot Cousin, RN 04/03/2016, 8:41 PM

## 2016-04-03 NOTE — Anesthesia Postprocedure Evaluation (Signed)
Anesthesia Post Note  Patient: Brandy Dudley  Procedure(s) Performed: Procedure(s) (LRB): INSERTION OF DIALYSIS CATHETER (Left) ARTERIOVENOUS (AV) FISTULA CREATION (Left)  Patient location during evaluation: PACU Anesthesia Type: General Level of consciousness: awake and alert Pain management: pain level controlled Vital Signs Assessment: post-procedure vital signs reviewed and stable Respiratory status: spontaneous breathing, nonlabored ventilation, respiratory function stable and patient connected to nasal cannula oxygen Cardiovascular status: blood pressure returned to baseline and stable Postop Assessment: no signs of nausea or vomiting Anesthetic complications: no    Last Vitals:  Filed Vitals:   04/03/16 1452 04/03/16 1456  BP:  104/51  Pulse: 71 69  Temp:    Resp: 23 22    Last Pain:  Filed Vitals:   04/03/16 1500  PainSc: 0-No pain                 Cecile Hearing

## 2016-04-03 NOTE — Progress Notes (Signed)
Spoke with Yetta Flock at Dr. Francie Massing office patient's cardiologist. They report it is a single chamber [place] ICD

## 2016-04-03 NOTE — Transfer of Care (Signed)
Immediate Anesthesia Transfer of Care Note  Patient: Brandy Dudley  Procedure(s) Performed: Procedure(s): INSERTION OF DIALYSIS CATHETER (Left) ARTERIOVENOUS (AV) FISTULA CREATION (Left)  Patient Location: PACU  Anesthesia Type:General  Level of Consciousness: oriented, sedated, patient cooperative and responds to stimulation  Airway & Oxygen Therapy: Patient Spontanous Breathing and Patient connected to face mask oxygen  Post-op Assessment: Report given to RN, Post -op Vital signs reviewed and stable, Patient moving all extremities and Patient moving all extremities X 4  Post vital signs: Reviewed and stable  Last Vitals:  Filed Vitals:   04/03/16 1356 04/03/16 1357  BP:  90/57  Pulse: 73 63  Temp:    Resp: 23 17    Last Pain:  Filed Vitals:   04/03/16 1422  PainSc: 0-No pain         Complications: No apparent anesthesia complications

## 2016-04-03 NOTE — Interval H&P Note (Signed)
History and Physical Interval Note:  04/03/2016 10:52 AM  Brandy Dudley  has presented today for surgery, with the diagnosis of End Stage Renal Disease N18.6  The various methods of treatment have been discussed with the patient and family. After consideration of risks, benefits and other options for treatment, the patient has consented to  Procedure(s): INSERTION OF DIALYSIS CATHETER (N/A) ARTERIOVENOUS (AV) FISTULA CREATION (Left) as a surgical intervention .  The patient's history has been reviewed, patient examined, no change in status, stable for surgery.  I have reviewed the patient's chart and labs.  Questions were answered to the patient's satisfaction.     Gretta Began

## 2016-04-03 NOTE — Progress Notes (Signed)
Subjective: No acute concerns today. Excited that fistula will be placed today.   Objective: Vital signs in last 24 hours: Temp:  [97.5 F (36.4 C)-98.2 F (36.8 C)] 97.5 F (36.4 C) (05/08 0610) Pulse Rate:  [68-74] 74 (05/08 0610) Resp:  [16-18] 18 (05/08 0610) BP: (95-109)/(56-77) 108/60 mmHg (05/08 0610) SpO2:  [96 %-100 %] 96 % (05/08 0610) Weight:  [240 lb 8.4 oz (109.1 kg)] 240 lb 8.4 oz (109.1 kg) (05/07 2151) Weight change: 0 lb (0 kg)  Intake/Output from previous day: 05/07 0701 - 05/08 0700 In: 740 [P.O.:600; I.V.:140] Out: 0  Intake/Output this shift:    General appearance: alert and cooperative GI: soft, non-tender; bowel sounds normal; no masses,  no organomegaly Extremities: edema trace  Skin: Entrance wound of previous right inguinal catheter, no drainage noted  Lab Results:  Recent Labs  04/02/16 0444 04/03/16 0434  WBC 7.9 7.5  HGB 7.4* 7.8*  HCT 23.2* 24.1*  PLT 149* 169   BMET:   Recent Labs  04/02/16 0438 04/03/16 0434  NA 136 135  K 3.8 4.2  CL 99* 99*  CO2 26 24  GLUCOSE 113* 150*  BUN 27* 36*  CREATININE 2.94* 3.59*  CALCIUM 8.6* 8.8*   No results for input(s): PTH in the last 72 hours. Iron Studies: No results for input(s): IRON, TIBC, TRANSFERRIN, FERRITIN in the last 72 hours. Studies/Results: No results found.  Scheduled: . antiseptic oral rinse  7 mL Mouth Rinse BID  . atorvastatin  10 mg Oral Daily  . budesonide (PULMICORT) nebulizer solution  0.5 mg Nebulization BID  . carvedilol  3.125 mg Oral BID WC  .  ceFAZolin (ANCEF) IV  2 g Intravenous On Call  . famotidine  20 mg Oral Daily  . insulin aspart  0-20 Units Subcutaneous TID WC  . insulin aspart  0-5 Units Subcutaneous QHS  . insulin aspart  4 Units Subcutaneous TID WC  . multivitamin  1 tablet Oral QHS  . vancomycin  250 mg Oral QID   Background Pt is a 65 y.o. yo female who was admitted on 03/20/2016 with C Diff colitis and acute respiratory distress/ AoCHF,  developed AKI on CKD->new ESRD d/t cardiorenal syndrome. Other probs COPD, DM2, AICD in place,   1. AKI/CKD->new ESRD in the setting of decompensated biventricular CHF: Off CVVHD. IHD initiated 5/1 with last HD 5/5. Right femoral dialysis catheter removed 5/6.Marland Kitchen Encouraged getting out of bed after catheter removal. Vascular surgery consulted concerning perm cath and AV access today. Vein mapping on 5/1. Cr 3.59 today, baseline 1.8-2. Continue OP CLIP process. Has outpt HD spot at [facility] TTS 2nd shift 2. Anemia/Thrombocytopenia: Hgb 7.8, Platelets 169 down from 165 at admission. Needs to start Aranesp, needs Fe studies 3. H/o DVT - INR 1.42. Pharmacy following/ management per primary 4. GI bleed- Reports blood in stool, management per primary 5. CAD/iCM/CHF- cards managing. 6. DM2: SSI per primary 7. C Diff colitis- on Vanc PO per primary. 8. Decubitus ulcers with another wound in right groin. Recommend WOC consult. 9. CKD-MBD - needs PTH. Last phos 3.6 no binders at this time. PTH with HD in AM 10. Vascular access - for [facility], AVF today   [facility] 04/03/2016,7:04 AM    I have seen and examined this patient and agree with plan and assessment in the above note. New ESRD - CLIPPED to [facility] TTS schedule 2nd shift. TDC and permanent access today. Hb 7.8 needs to start Aranesp. No iron  studies available. Obtain tomorrow with HD along with PTH  Ralonda Tartt B,MD 04/03/2016 6:20 PM

## 2016-04-03 NOTE — Op Note (Signed)
    OPERATIVE REPORT  DATE OF SURGERY: 04/03/2016  PATIENT: Brandy Dudley, 65 y.o. female MRN: [id number]  DOB: [birth year]  PRE-OPERATIVE DIAGNOSIS: End-stage renal disease  POST-OPERATIVE DIAGNOSIS:  Same  PROCEDURE: #1 left femoral tunneled hemodialysis catheter, #2 left upper arm brachiocephalic AV fistula creation  SURGEON:  Gretta Began, M.D.  PHYSICIAN ASSISTANT: Nurse  ANESTHESIA:  Gen.  EBL: Minimal ml  Total I/O In: 350 [I.V.:350] Out: 10 [Blood:10]  BLOOD ADMINISTERED: None  DRAINS: None  SPECIMEN: None  COUNTS CORRECT:  YES  PLAN OF CARE: PACU   PATIENT DISPOSITION:  PACU - hemodynamically stable  PROCEDURE DETAILS: The patient was taken to the operating room placed supine position where the area of the left and right groins were prepped and draped in usual sterile fashion. 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 The patient had recently had a temporary femoral dialysis catheter removed from the right groin and there was moderate amount of irritation skin at this area. The left groin and not been used. The SonoSite ultrasound was used to aid in accessing the left common femoral vein with a singlewall puncture. A guidewire was passed centrally and this was confirmed with fluoroscopy. A dilator and peel-away sheath was passed over the guidewire and a femoral length palindrome hemodialysis catheter was passed through the peel-away sheath which was removed. This was under fluoroscopic Guidance with the catheter placed in the distal right atrium. The catheter was then brought through subcutaneous tunnel through a separate stab incision in the 2 lm ports were attached. Both lumens flushed and aspirated easily and were locked with 1000 unit per cc heparin. The catheter was imaged entirety and there was no kinking. The catheter was secured to the skin with a 3-0 nylon stitch and the entry site at the  common femoral vein was closed with a 4-0 subcuticular Vicryl stitch.  Attention was then turned to the left arm. This had shown reasonable cephalic vein and basilic vein on preoperative vein map. I imaged this area myself with SonoSite and did have a moderate cephalic vein on the left. Did run rather deep to the subcutaneous fat. Using the incision at the antecubital space the brachial artery was exposed. There was a high bifurcation the brachial artery and the more superficial of the arteries was chosen for anastomosis. The cephalic vein had multiple branches of these were ligated with 4-0 silk ties and divided. The cephalic vein was ligated distally and was divided and mobilized to the level the brachial artery. The artery was occluded proximally and distally and was opened with 11 blade incision longitudinally with Potts scissors. A 2 dilator passed through the artery with no resistance. The vein was cut to appropriate length and was spatulated and sewn end-to-side to the artery with a running 6-0 Prolene suture. Clamps removed and good flow was noted through the cephalic vein. The wound irrigated with saline. Hemostasis tablet cautery. Wound was closed with 3-0 Vicryl in the subcutaneous subcutaneous tissue. Benzoin Steri-Strips were applied   Gretta Began, M.D. 04/03/2016 1:47 PM

## 2016-04-04 ENCOUNTER — Encounter (HOSPITAL_COMMUNITY): Payer: Self-pay | Admitting: Vascular Surgery

## 2016-04-04 ENCOUNTER — Other Ambulatory Visit: Payer: Self-pay

## 2016-04-04 DIAGNOSIS — Z48812 Encounter for surgical aftercare following surgery on the circulatory system: Secondary | ICD-10-CM

## 2016-04-04 DIAGNOSIS — N186 End stage renal disease: Secondary | ICD-10-CM

## 2016-04-04 LAB — GLUCOSE, CAPILLARY
GLUCOSE-CAPILLARY: 103 mg/dL — AB (ref 65–99)
GLUCOSE-CAPILLARY: 159 mg/dL — AB (ref 65–99)
Glucose-Capillary: 167 mg/dL — ABNORMAL HIGH (ref 65–99)

## 2016-04-04 LAB — CBC
HEMATOCRIT: 26.7 % — AB (ref 36.0–46.0)
HEMOGLOBIN: 8.4 g/dL — AB (ref 12.0–15.0)
MCH: 28.6 pg (ref 26.0–34.0)
MCHC: 31.5 g/dL (ref 30.0–36.0)
MCV: 90.8 fL (ref 78.0–100.0)
Platelets: 180 10*3/uL (ref 150–400)
RBC: 2.94 MIL/uL — ABNORMAL LOW (ref 3.87–5.11)
RDW: 19.1 % — ABNORMAL HIGH (ref 11.5–15.5)
WBC: 7.7 10*3/uL (ref 4.0–10.5)

## 2016-04-04 LAB — PROTIME-INR
INR: 1.61 — AB (ref 0.00–1.49)
Prothrombin Time: 19.2 seconds — ABNORMAL HIGH (ref 11.6–15.2)

## 2016-04-04 LAB — IRON AND TIBC
IRON: 31 ug/dL (ref 28–170)
Saturation Ratios: 9 % — ABNORMAL LOW (ref 10.4–31.8)
TIBC: 328 ug/dL (ref 250–450)
UIBC: 297 ug/dL

## 2016-04-04 LAB — RENAL FUNCTION PANEL
Albumin: 2.6 g/dL — ABNORMAL LOW (ref 3.5–5.0)
Anion gap: 12 (ref 5–15)
BUN: 41 mg/dL — AB (ref 6–20)
CHLORIDE: 101 mmol/L (ref 101–111)
CO2: 25 mmol/L (ref 22–32)
CREATININE: 4.26 mg/dL — AB (ref 0.44–1.00)
Calcium: 9 mg/dL (ref 8.9–10.3)
GFR calc Af Amer: 12 mL/min — ABNORMAL LOW (ref >60)
GFR, EST NON AFRICAN AMERICAN: 10 mL/min — AB (ref >60)
Glucose, Bld: 141 mg/dL — ABNORMAL HIGH (ref 65–99)
Phosphorus: 5.4 mg/dL — ABNORMAL HIGH (ref 2.5–4.6)
Potassium: 4.4 mmol/L (ref 3.5–5.1)
Sodium: 138 mmol/L (ref 135–145)

## 2016-04-04 LAB — PREPARE FRESH FROZEN PLASMA: Unit division: 0

## 2016-04-04 LAB — FERRITIN: Ferritin: 181 ng/mL (ref 11–307)

## 2016-04-04 LAB — HEPARIN LEVEL (UNFRACTIONATED): HEPARIN UNFRACTIONATED: 0.56 [IU]/mL (ref 0.30–0.70)

## 2016-04-04 MED ORDER — DARBEPOETIN ALFA 100 MCG/0.5ML IJ SOSY
PREFILLED_SYRINGE | INTRAMUSCULAR | Status: AC
  Administered 2016-04-04: 100 ug via INTRAVENOUS
  Filled 2016-04-04: qty 0.5

## 2016-04-04 MED ORDER — WARFARIN SODIUM 5 MG PO TABS
5.0000 mg | ORAL_TABLET | Freq: Once | ORAL | Status: AC
  Administered 2016-04-04: 5 mg via ORAL
  Filled 2016-04-04: qty 1

## 2016-04-04 MED ORDER — WARFARIN - PHARMACIST DOSING INPATIENT
Freq: Every day | Status: DC
  Administered 2016-04-05 – 2016-04-09 (×3)

## 2016-04-04 NOTE — Progress Notes (Signed)
ANTICOAGULATION CONSULT NOTE   Pharmacy Consult for Heparin Indication: hx DVT  Allergies  Allergen Reactions  . Entresto [Sacubitril-Valsartan] Other (See Comments)    On MAR    Patient Measurements: Height: 5\' 4"  (162.6 cm) Weight: 236 lb 5.3 oz (107.2 kg) IBW/kg (Calculated) : 54.7 Heparin Dosing Weight: 80kg  Vital Signs: Temp: 98.3 F (36.8 C) (05/09 0721) Temp Source: Oral (05/09 0721) BP: 89/49 mmHg (05/09 1000) Pulse Rate: 67 (05/09 1000)  Labs:  Recent Labs  04/02/16 0438 04/02/16 0444 04/02/16 1611 04/03/16 0434 04/04/16 0747 04/04/16 0903  HGB  --  7.4*  --  7.8* 8.4*  --   HCT  --  23.2*  --  24.1* 26.7*  --   PLT  --  149*  --  169 180  --   LABPROT  --  17.4*  --  17.4* 19.2*  --   INR  --  1.42  --  1.41 1.61*  --   HEPARINUNFRC  --  0.24* 0.41 0.37 0.56  --   CREATININE 2.94*  --   --  3.59*  --  4.26*    Estimated Creatinine Clearance: 15.7 mL/min (by C-G formula based on Cr of 4.26).  Assessment: 65 yo F on warfarin pta for recent RIJ DVT. INR on admit supratherapeutic - warfarin was held and reversed for central line and temp HD cath placement. Warfarin then resumed 4/28 and now on hold for permanent HD cath placement. Pharmacy asked to begin heparin once INR < 2 and Heparin was started 5/2.  Planning permanent HD cath once INR < 1.3 (vitamin K 5mg  po given 5/4 and 10mg  po given 5/5, 5 mg po given 5/7).  Now S/P #1 L. femoral tunneled HD cath, #2 L. upper arm brachiocephalic AV fistula creation on 5/8.  INR today is 1.61, pt PO intake is 0-25% for several days.  Heparin level this morning is therapeutic at 0.56.  Hgb 8.4, plts 180. FOB was noted to be positive - MD aware and to continue with heparin drip for now. RN will continue to watch.   Goal of Therapy:  Heparin level 0.3-0.7 units/ml Monitor platelets by anticoagulation protocol: Yes   Plan:  1. Continue Heparin @ 1750 units/hr (17.5 ml/hr) 2. Will continue to monitor for any  signs/symptoms of bleeding and will follow up with a daily HL and CBC  Thank you for allowing [place] to participate in this patients care. Signe Colt, PharmD Pager: [phone]  04/04/2016, 10:08 AM

## 2016-04-04 NOTE — Progress Notes (Signed)
PROGRESS NOTE    Brandy Dudley  WUJ:[id number] DOB: 11-27-1951 DOA: 03/20/2016 PCP: PROVIDER NOT IN SYSTEM   Outpatient Specialists:     Brief Narrative:  Brandy Dudley is a 65 y.o. admitted from [facility] to IM service per notes for further evaluation for C. Diff colitis, Acute CHF exacerbation, and ARF on CKD stage III. HF team consulted to assist with fluid management.  Found to need HD  And awaiting INR of < 1.3 for perm cath and AV fistula   Assessment & Plan:   Principal Problem:   Acute on chronic congestive heart failure (HCC) Active Problems:   Physical deconditioning   DVT (deep venous thrombosis), right   Chronic systolic congestive heart failure (HCC)   Cardiomyopathy, ischemic   Coronary artery disease involving native coronary artery of native heart without angina pectoris   Essential hypertension, malignant   Diabetes mellitus type 2 in obese (HCC)   C. difficile colitis   COPD (chronic obstructive pulmonary disease) (HCC)   Chest wall abscess   Obstructive sleep apnea   AICD (automatic cardioverter/defibrillator) present   C. difficile diarrhea   Chronic kidney disease (CKD), stage IV (severe) (HCC)   AKI (acute kidney injury) (HCC)   Respiratory failure (HCC)   Bloody diarrhea   Anasarca   ESRD on dialysis (HCC)   Pressure ulcer    C difficile colitis -on clindamycin recently for MRSA breast abscess -s/p po vancomycin til 5/8  Acute on chronic systolic CHF: Ischemic cardiomyopathy with history of prior CAD  -Echo 03/21/16 35-40% with Grade 2 DD, mod dilated RV, mild LAE, mod RAE, Severe TR, Peak PA 38 mm Hg. - Continue coreg 3.125 mg twice a day. Hold SBP <85. -appreciate HF team   AKI on CKD: Now requiring RRT -appreciate renal -AVF left side (same as ICD)  R IJ DVT in 3/17: INR 1.7 , need 1.3 for dialysis catheter placement. Covering with heparin while INR < 2 -- resume coumadin  CAD: History not available. On ASA + Plavix in the past. This  was held due to hematuria at last admission (foley trauma) and need to use warfarin. Continue atorvastatin.      Anemia: Had blood in stool, likely related to C difficile colitis and high INR. -Hgb trending down, re-check stool for blood   Immobility- PT following -SNF eventually (from [facility])  Macerated skin -WOC consult: Foam dressing to protect and promote healing to right lower breast. Aquacel to right groin to absorb drainage and provide antimicrobilal benefits. Barrier cream to inner gluteal fold and buttocks to protect and repel moisture.    DVT prophylaxis:  Heparin while INR < 2- restarted coumadin 5/9  Code Status: Full Code   Family Communication: No family at bedside  Disposition Plan:  From [facility]-- back when medically ready-- not before next week I suspect   Consultants:   CHF team  PCCM  Procedures:      Subjective: Had dialysis this AM  Objective: Filed Vitals:   04/04/16 1030 04/04/16 1057 04/04/16 1113 04/04/16 1121  BP: 90/48 102/57 92/54 95/54   Pulse: 71 72 72 73  Temp:    97.9 F (36.6 C)  TempSrc:    Oral  Resp:    16  Height:      Weight:    106.3 kg (234 lb 5.6 oz)  SpO2:    98%    Intake/Output Summary (Last 24 hours) at 04/04/16 1415 Last data filed at 04/04/16 1208  Gross  per 24 hour  Intake 489.17 ml  Output   1154 ml  Net -664.83 ml   Filed Weights   04/03/16 2144 04/04/16 0721 04/04/16 1121  Weight: 110.7 kg (244 lb 0.8 oz) 107.2 kg (236 lb 5.3 oz) 106.3 kg (234 lb 5.6 oz)    Examination:  General exam: Appears calm and comfortable  Respiratory system: Clear to auscultation. Respiratory effort normal. Cardiovascular system: S1 & S2 heard, RRR. +edema Gastrointestinal system: Abdomen is nondistended, soft and nontender. No organomegaly or masses felt. Normal bowel sounds heard. Central nervous system: Alert and oriented. No focal neurological deficits. Extremities: weak, skin in groin/under  breast with some breakdown Psychiatry: Judgement and insight appear normal. Mood & affect appropriate.     Data Reviewed: I have personally reviewed following labs and imaging studies  CBC:  Recent Labs Lab 03/31/16 2000 04/01/16 1330 04/02/16 0444 04/03/16 0434 04/04/16 0747  WBC 10.2 8.1 7.9 7.5 7.7  HGB 7.5* 7.6* 7.4* 7.8* 8.4*  HCT 23.3* 23.6* 23.2* 24.1* 26.7*  MCV 88.3 88.4 88.9 88.9 90.8  PLT 126* 142* 149* 169 180   Basic Metabolic Panel:  Recent Labs Lab 03/29/16 0410 03/29/16 0513 03/30/16 0845 03/31/16 0537 03/31/16 1945 04/02/16 0438 04/03/16 0434 04/03/16 1423 04/04/16 0903  NA 137 138 139 138 137 136 135  --  138  K 3.5 3.6 3.6 4.0 3.2* 3.8 4.2  --  4.4  CL 102 104 104 103 99* 99* 99*  --  101  CO2 27 28 27 23 28 26 24   --  25  GLUCOSE 238* 242* 156* 159* 161* 113* 150*  --  141*  BUN 26* 26* 34* 24* 15 27* 36*  --  41*  CREATININE 2.66* 2.72* 2.85* 2.36* 1.62* 2.94* 3.59*  --  4.26*  CALCIUM 8.0* 8.0* 8.7* 8.5* 8.2* 8.6* 8.8*  --  9.0  MG 2.2  --   --   --   --   --   --  2.1  --   PHOS 2.7 2.7 3.5  --  2.2* 3.6  --   --  5.4*   GFR: Estimated Creatinine Clearance: 15.7 mL/min (by C-G formula based on Cr of 4.26). Liver Function Tests:  Recent Labs Lab 03/29/16 0513 03/30/16 0845 03/31/16 1945 04/02/16 0438 04/04/16 0903  ALBUMIN 2.4* 2.7* 2.6* 2.6* 2.6*   No results for input(s): LIPASE, AMYLASE in the last 168 hours. No results for input(s): AMMONIA in the last 168 hours. Coagulation Profile:  Recent Labs Lab 03/31/16 0537 04/01/16 0530 04/02/16 0444 04/03/16 0434 04/04/16 0747  INR 1.52* 1.47 1.42 1.41 1.61*   Cardiac Enzymes: No results for input(s): CKTOTAL, CKMB, CKMBINDEX, TROPONINI in the last 168 hours. BNP (last 3 results) No results for input(s): PROBNP in the last 8760 hours. HbA1C: No results for input(s): HGBA1C in the last 72 hours. CBG:  Recent Labs Lab 04/03/16 0815 04/03/16 1401 04/03/16 1540  04/03/16 2141 04/04/16 1207  GLUCAP 138* 146* 127* 100* 103*   Lipid Profile: No results for input(s): CHOL, HDL, LDLCALC, TRIG, CHOLHDL, LDLDIRECT in the last 72 hours. Thyroid Function Tests: No results for input(s): TSH, T4TOTAL, FREET4, T3FREE, THYROIDAB in the last 72 hours. Anemia Panel:  Recent Labs  04/04/16 0748  FERRITIN 181  TIBC 328  IRON 31   Urine analysis:    Component Value Date/Time   COLORURINE AMBER* 03/14/2016 1655   APPEARANCEUR CLOUDY* 03/14/2016 1655   LABSPEC 1.019 03/14/2016 1655   PHURINE 5.0 03/14/2016  1655   GLUCOSEU NEGATIVE 03/14/2016 1655   HGBUR LARGE* 03/14/2016 1655   BILIRUBINUR SMALL* 03/14/2016 1655   KETONESUR 15* 03/14/2016 1655   PROTEINUR 30* 03/14/2016 1655   NITRITE NEGATIVE 03/14/2016 1655   LEUKOCYTESUR TRACE* 03/14/2016 1655    No results found for this or any previous visit (from the past 240 hour(s)).    Anti-infectives    Start     Dose/Rate Route Frequency Ordered Stop   04/03/16 0000  ceFAZolin (ANCEF) IVPB 1 g/50 mL premix  Status:  Discontinued    Comments:  Send with pt to OR   1 g 100 mL/hr over 30 Minutes Intravenous On call 04/02/16 0724 04/02/16 1350   04/03/16 0000  ceFAZolin (ANCEF) IVPB 2g/100 mL premix    Comments:  Send with pt to OR   2 g 200 mL/hr over 30 Minutes Intravenous On call 04/02/16 1350 04/03/16 1200   03/29/16 1127  cefUROXime (ZINACEF) 1.5 g in dextrose 5 % 50 mL IVPB     1.5 g 100 mL/hr over 30 Minutes Intravenous To [facility] 03/28/16 2305 03/30/16 1130   03/23/16 1400  vancomycin (VANCOCIN) 50 mg/mL oral solution 250 mg     250 mg Oral 4 times daily 03/23/16 1051 04/03/16 1759   03/20/16 1800  vancomycin (VANCOCIN) 50 mg/mL oral solution 125 mg  Status:  Discontinued     125 mg Oral 4 times daily 03/20/16 1637 03/23/16 1051   03/20/16 1230  metroNIDAZOLE (FLAGYL) IVPB 500 mg  Status:  Discontinued     500 mg 100 mL/hr over 60 Minutes Intravenous Every 8 hours 03/20/16 1218  03/20/16 1538       Radiology Studies: No results found.      Scheduled Meds: . sodium chloride   Intravenous Once  . antiseptic oral rinse  7 mL Mouth Rinse BID  . atorvastatin  10 mg Oral Daily  . budesonide (PULMICORT) nebulizer solution  0.5 mg Nebulization BID  . carvedilol  3.125 mg Oral BID WC  . darbepoetin (ARANESP) injection - DIALYSIS  100 mcg Intravenous Q Tue-HD  . famotidine  20 mg Oral Daily  . insulin aspart  0-20 Units Subcutaneous TID WC  . insulin aspart  0-5 Units Subcutaneous QHS  . insulin aspart  4 Units Subcutaneous TID WC  . multivitamin  1 tablet Oral QHS  . warfarin  5 mg Oral ONCE-1800  . Warfarin - Pharmacist Dosing Inpatient   Does not apply q1800   Continuous Infusions: . sodium chloride 10 mL/hr at 04/03/16 0905  . heparin 1,750 Units/hr (04/03/16 1620)     LOS: 15 days    Time spent: 25 min    Halima Fogal Juanetta Gosling, DO [facility] Pager [phone]  If 7PM-7AM, please contact night-coverage [email or web address] Password [email or web address] 04/04/2016, 2:15 PM

## 2016-04-04 NOTE — Consult Note (Addendum)
WOC wound consult note Reason for Consult: Consult requested for buttocks and right groin.  Pt states she has frequently been incontinent of diarrhea and is currently incontinent of urine; it is difficult to keep affected areas from becoming constantly moist or soiled.  Skin is macerated and moist to bilat buttocks and inner gluteal crease; appearance consistent with moisture associated skin damage.  This has contributed to a partial thickness  fissure inside the gluteal cleft, near the sacrum region.  1X.1X.1cm, pink and moist.   Right groin has also remained constantly moist, contributing to breakdown of previous wound to right groin.  There are patchy areas of yellow raised skin; affected area is 1X1cm.  No fluctuance, depth, or odor. Appearance consistent with moisture and possible candidiasis. Right lower breast skin fold with partial thickness fissure related to moisture; .1X.5X.1cm, red and moist.  Dressing procedure/placement/frequency: Foam dressing to protect and promote healing to right lower breast.  Aquacel to right groin to absorb drainage and provide antimicrobilal benefits.  Barrier cream to inner gluteal fold and buttocks to protect and repel moisture.  Discussed plan of care with patient and she verbalized understanding. Please re-consult if further assistance is needed.  Thank-you,  Cammie Mcgee MSN, RN, CWOCN, [place], CNS [phone]

## 2016-04-04 NOTE — Progress Notes (Signed)
PT Cancellation Note  Patient Details Name: Brandy Dudley MRN: [id number] DOB: 1951/03/29   Cancelled Treatment:    Reason Eval/Treat Not Completed: Patient at procedure or test/unavailable   Currently in HD;  Will follow up later today as time allows;  Otherwise, will follow up for PT tomorrow;   Thank you,  Van Clines, PT  Acute Rehabilitation Services Pager [phone] Office [phone]     Van Clines Hamff 04/04/2016, 8:25 AM

## 2016-04-04 NOTE — Evaluation (Signed)
Physical Therapy Re-Evaluation Patient Details Name: Brandy Dudley MRN: [id number] DOB: 1951-02-04 Today's Date: 04/04/2016   History of Present Illness  Pt is a 65 y/o F admitted on 03/20/16 for C diff and acute respiratory distress/CHF.  She was recently hospitalized from 3/15-4/05 in [place], requiring HD at that time and s/p ICD.  Pt's PMH includes CHF, COPD, DVT.  Clinical Impression   Seen for re-eval as her Temporary Femoral HD Catheter is dc'd, and pt is now cleared for mobility; Overall tolerated moving and getting OOB well, considering she had been on bedrest for quite some time; Pt currently with functional limitations due to the deficits listed below (see PT Problem List). Pt will benefit from skilled PT to increase their independence and safety with mobility to allow discharge to the venue listed below.       Follow Up Recommendations SNF;Supervision/Assistance - 24 hour    Equipment Recommendations  Other (comment) (TBD at next venue of care)    Recommendations for Other Services       Precautions / Restrictions Precautions Precautions: Fall      Mobility  Bed Mobility Overal bed mobility: Needs Assistance;+ 2 for safety/equipment Bed Mobility: Supine to Sit     Supine to sit: Mod assist     General bed mobility comments: Mod assist to help LEs off of the bed and elevate trunk to sit  Transfers Overall transfer level: Needs assistance Equipment used: Rolling walker (2 wheeled) Transfers: Sit to/from Stand Sit to Stand: +2 safety/equipment;Mod assist         General transfer comment: Mod assist to power up and to steady at initial stand  Ambulation/Gait Ambulation/Gait assistance: Min assist;+2 safety/equipment Ambulation Distance (Feet):  (pivot steps bed to recliner) Assistive device: Rolling walker (2 wheeled) Gait Pattern/deviations: Shuffle     General Gait Details: cues to self-monitor for activity tolerance  Stairs            Wheelchair  Mobility    Modified Rankin (Stroke Patients Only)       Balance Overall balance assessment: Needs assistance   Sitting balance-Leahy Scale: Fair       Standing balance-Leahy Scale: Poor                               Pertinent Vitals/Pain Pain Assessment: No/denies pain    Home Living Family/patient expects to be discharged to:: Skilled nursing facility                 Additional Comments: From [facility]    Prior Function Level of Independence: Needs assistance   Gait / Transfers Assistance Needed: Says she last ambulated in March using a cane.  Would need assist to propel WC at [facility].  Was doing a stand pivot w/ RW to get to [facility].   ADL's / Homemaking Assistance Needed: Needed assist at [employer] w/ bathing and dressing  Comments: Was living at her friend's house in [place].  Was recently hospitalized in [place] and then to [facility] from there. Moved to [facility] in Nov 2014 because her sister had an aneurysm.       Hand Dominance   Dominant Hand: Right    Extremity/Trunk Assessment   Upper Extremity Assessment: Generalized weakness           Lower Extremity Assessment: Generalized weakness         Communication   Communication: No difficulties  Cognition Arousal/Alertness:  Awake/alert Behavior During Therapy: WFL for tasks assessed/performed Overall Cognitive Status: Within Functional Limits for tasks assessed                      General Comments General comments (skin integrity, edema, etc.): VSS; no signs of orthostasis    Exercises        Assessment/Plan    PT Assessment Patient needs continued PT services  PT Diagnosis Generalized weakness;Difficulty walking   PT Problem List Decreased strength;Decreased activity tolerance;Decreased balance;Decreased mobility;Decreased cognition;Decreased safety awareness;Decreased knowledge of precautions;Cardiopulmonary status limiting activity  PT Treatment  Interventions DME instruction;Gait training;Functional mobility training;Therapeutic activities;Therapeutic exercise;Balance training;Cognitive remediation;Patient/family education   PT Goals (Current goals can be found in the Care Plan section) Acute Rehab PT Goals Patient Stated Goal: "I want to be able to walk", "I had a dream that I could walk" PT Goal Formulation: With patient Time For Goal Achievement: 04/18/16 (Goals updated today) Potential to Achieve Goals: Good    Frequency Min 2X/week   Barriers to discharge        Co-evaluation               End of Session Equipment Utilized During Treatment: Oxygen Activity Tolerance: Patient tolerated treatment well Patient left: in chair;with call [place]/phone within reach;with chair alarm set Nurse Communication: Mobility status;Other (comment)         Time: 1610-9604 PT Time Calculation (min) (ACUTE ONLY): 29 min   Charges:   PT Evaluation $PT Re-evaluation: 1 Procedure PT Treatments $Therapeutic Activity: 8-22 mins   PT G Codes:        Olen Pel 04/04/2016, 3:57 PM  Van Clines, [place]  Acute Rehabilitation Services Pager [phone] Office [phone]

## 2016-04-04 NOTE — Progress Notes (Signed)
[facility] Rounding Note  Subjective/Interim Events:  Seen in HD Access yesterday -  tunnelled HD cath left thigh + LU arm BC AVF No steal and catheter working fine   Objective: Vital signs in last 24 hours: Temp:  [97.6 F (36.4 C)-98.3 F (36.8 C)] 98.3 F (36.8 C) (05/09 0721) Pulse Rate:  [39-116] 72 (05/09 0759) Resp:  [15-28] 15 (05/09 0721) BP: (80-104)/(39-70) 89/54 mmHg (05/09 0759) SpO2:  [93 %-100 %] 96 % (05/09 0721) Weight:  [107.2 kg (236 lb 5.3 oz)-110.7 kg (244 lb 0.8 oz)] 107.2 kg (236 lb 5.3 oz) (05/09 0721) Weight change: 1.6 kg (3 lb 8.4 oz)  Intake/Output from previous day: 05/08 0701 - 05/09 0700 In: 969.2 [I.V.:969.2] Out: 160 [Urine:150; Blood:10] Intake/Output this shift:   Physical Examination BP 89/54 mmHg  Pulse 72  Temp(Src) 98.3 F (36.8 C) (Oral)  Resp 15  Ht  (1.626 m)  Wt 107.2 kg (236 lb 5.3 oz)  BMI 40.55 kg/m2  SpO2 96%  General appearance: alert and cooperative GI: soft, non-tender; bowel sounds normal; no masses,  no organomegaly Left ICD in place Extremities: edema trace  Skin: Entrance wound of previous right inguinal catheter, no drainage noted New left fem TDC with dsg in place and in use (5/9) Left upper arm BC AVF + very faint bruit (5/8) and I cannot feel thrill at all  Lab Results:  Recent Labs  04/03/16 0434 04/04/16 0747  WBC 7.5 7.7  HGB 7.8* 8.4*  HCT 24.1* 26.7*  PLT 169 180   BMET:   Recent Labs  04/02/16 0438 04/03/16 0434  NA 136 135  K 3.8 4.2  CL 99* 99*  CO2 26 24  GLUCOSE 113* 150*  BUN 27* 36*  CREATININE 2.94* 3.59*  CALCIUM 8.6* 8.8*   Scheduled Medications: . sodium chloride   Intravenous Once  . antiseptic oral rinse  7 mL Mouth Rinse BID  . atorvastatin  10 mg Oral Daily  . budesonide (PULMICORT) nebulizer solution  0.5 mg Nebulization BID  . carvedilol  3.125 mg Oral BID WC  . darbepoetin (ARANESP) injection - DIALYSIS  100 mcg Intravenous Q Tue-HD  . famotidine  20 mg Oral  Daily  . insulin aspart  0-20 Units Subcutaneous TID WC  . insulin aspart  0-5 Units Subcutaneous QHS  . insulin aspart  4 Units Subcutaneous TID WC  . multivitamin  1 tablet Oral QHS    Background Pt is a 65 y.o. yo female who was admitted on 03/20/2016 with C Diff colitis and acute respiratory distress/ AoCHF, developed AKI on CKD->new ESRD d/t cardiorenal syndrome. Other probs COPD, DM2, AICD in place,   1. AKI/CKD->new ESRD in the setting of decompensated biventricular CHF: CVVHD->transitioned to IHD  5/1. Dialysis today on outpt schedule via new left fem TDC. Right femoral dialysis catheter removed 5/6. Has outpt HD spot at [facility] TTS 2nd shift 2. Anemia/Thrombocytopenia: Hgb 7.8, Platelets 169 down from 165 at admission. Needs to start Aranesp, needs Fe studies - both have been ordered - to get Aranesp 100 today + fe studies sent at HD. 3. H/o DVT - INR 1.42. Pharmacy following/ management per primary 4. GI bleed- Reports blood in stool, management per primary 5. CAD/iCM/CHF- cards managing. 6. DM2: SSI per primary 7. C Diff colitis- on Vanc PO per primary. 8. Decubitus ulcers with another wound in right groin. Recommend WOC consult. 9. CKD-MBD - needs PTH. Last phos 3.6 no binders at this time. PTH  drawn in HD 10. Vascular access - L fem TDC/L BC AVF (5/8 Dr. Arbie Cookey). I am concerned about the AVF on same side as the ICD and the bruit is quite faint, not well heard. May not mature.   Camille Bal, MD [facility] [phone] Pager 04/04/2016, 8:08 AM

## 2016-04-04 NOTE — Procedures (Signed)
I have personally attended this patient's dialysis session.   Using left TDC femoral BFR 400 2K2.25 Ca bath BP 90's Chemistries pending, as are Fe studies and PTH New L AVF (on same side as ICD) only very faint bruit...  Camille Bal, MD [facility] [phone] Pager 04/04/2016, 8:19 AM

## 2016-04-04 NOTE — Progress Notes (Signed)
ANTICOAGULATION CONSULT NOTE   Pharmacy Consult for Heparin>>warfarin Indication: hx DVT  Allergies  Allergen Reactions  . Entresto [Sacubitril-Valsartan] Other (See Comments)    On MAR    Patient Measurements: Height: 5\' 4"  (162.6 cm) Weight: 234 lb 5.6 oz (106.3 kg) IBW/kg (Calculated) : 54.7 Heparin Dosing Weight: 80kg  Vital Signs: Temp: 97.9 Dudley (36.6 C) (05/09 1121) Temp Source: Oral (05/09 1121) BP: 95/54 mmHg (05/09 1121) Pulse Rate: 73 (05/09 1121)  Labs:  Recent Labs  04/02/16 0438  04/02/16 0444 04/02/16 1611 04/03/16 0434 04/04/16 0747 04/04/16 0903  HGB  --   < > 7.4*  --  7.8* 8.4*  --   HCT  --   --  23.2*  --  24.1* 26.7*  --   PLT  --   --  149*  --  169 180  --   LABPROT  --   --  17.4*  --  17.4* 19.2*  --   INR  --   --  1.42  --  1.41 1.61*  --   HEPARINUNFRC  --   --  0.24* 0.41 0.37 0.56  --   CREATININE 2.94*  --   --   --  3.59*  --  4.26*  < > = values in this interval not displayed.  Estimated Creatinine Clearance: 15.7 mL/min (by C-G formula based on Cr of 4.26).  Assessment: 65 yo Dudley on warfarin pta for recent RIJ DVT. INR on admit supratherapeutic - warfarin was held and reversed for central line and temp HD cath placement. Warfarin was then on hold for permanent HD cath placement. Now S/P #1 L. femoral tunneled HD cath, #2 L. upper arm brachiocephalic AV fistula creation on 5/8. Pharmacy to restart warfarin again today. - 5/4 vit K 5mg  po given - 5/5 vit K 10mg  po given - 5/7 vit K 5 mg po given   INR today is 1.61, pt PO intake is 0-25% for several days. Will give 5 mg today but would be cautious with continued dosing d/t low PO intake and supratherapeutic INR on admission. Last dose given was 3 mg on 4/29  Warfarin PTA dose: 4.5 mg daily  Heparin level this morning was therapeutic at 0.56.  Hgb 8.4, plts 180.  Goal of Therapy:  Heparin level 0.3-0.7 units/ml Monitor platelets by anticoagulation protocol: Yes   Plan:  1.  Continue Heparin @ 1750 units/hr (17.5 ml/hr) 2. Give warfarin 5 mg PO x 1 today 3. Monitor daily INR, HL, CBC, clinical course, s/sx of bleed, PO intake, DDI   Thank you for allowing [place] to participate in this patients care. Signe Colt, PharmD Pager: [phone]  04/04/2016, 2:00 PM

## 2016-04-04 NOTE — Progress Notes (Addendum)
        Patient in HD now, comfortable HD left thigh catheter working well Left arm with minimal edema, palpable thrill at anastomosis Left hand grip 5/5, sensation grossly intact, and palpable radial pulse  S/P #1 left femoral tunneled hemodialysis catheter, #2 left upper arm brachiocephalic AV fistula creation F/U in 4-6 weeks for duplex of left av fistula creation   COLLINS, EMMA MAUREEN PA-C  Addendum  I have independently interviewed and examined the patient, and I agree with the physician assistant's findings.  Follow up with Dr. Arbie Cookey in 6 weeks.    Leonides Sake, MD [facility] Office: [phone] Pager: [phone]  04/04/2016, 10:18 AM

## 2016-04-04 NOTE — Progress Notes (Signed)
Patient ID: Brandy Dudley, female   DOB: [birth year], 65 y.o.   MRN: [id number]     Advanced Heart Failure Rounding Note  Referring Physician: Dr. Glade Lloyd PCP: Not in system Primary Cardiologist: Previously in [place]  Subjective:    Started HD on 5/2, now getting IHD and tolerating well.   Feeling good. Had fistula placed 04/03/16. Had several runs of NSVT.  Device functioning normally.   She appears to have had several runs of NSVT yesterday from stress of fistula placement.  Electrolytes stable.  Occasional PVCs on tele.   Objective:   Weight Range: 236 lb 5.3 oz (107.2 kg) Body mass index is 40.55 kg/(m^2).   Vital Signs:   Temp:  [97.6 F (36.4 C)-98.3 F (36.8 C)] 98.3 F (36.8 C) (05/09 0721) Pulse Rate:  [39-116] 72 (05/09 1113) Resp:  [15-28] 15 (05/09 0721) BP: (80-104)/(39-70) 92/54 mmHg (05/09 1113) SpO2:  [93 %-100 %] 96 % (05/09 0721) Weight:  [236 lb 5.3 oz (107.2 kg)-244 lb 0.8 oz (110.7 kg)] 236 lb 5.3 oz (107.2 kg) (05/09 0721) Last BM Date: 04/03/16  Weight change: Filed Weights   04/02/16 2151 04/03/16 2144 04/04/16 0721  Weight: 240 lb 8.4 oz (109.1 kg) 244 lb 0.8 oz (110.7 kg) 236 lb 5.3 oz (107.2 kg)    Intake/Output:   Intake/Output Summary (Last 24 hours) at 04/04/16 1132 Last data filed at 04/04/16 0435  Gross per 24 hour  Intake 969.17 ml  Output    160 ml  Net 809.17 ml     Physical Exam: General: Appears older than stated age, NAD. In dialysis HEENT: normal Neck: supple. JVP difficult to assess. Carotids 2+ bilat; no bruits. No thyromegaly or nodule noted Cor: PMI nondisplaced. RRR. + TR/MR murmur Lungs: Slightly decreased basilar sounds.  Abdomen: Obese, tight, umbilical hernia, non-tender, mild/mod distention, no HSM. +BS  Extremities: no cyanosis, clubbing, rash, trace BLE edema.  Neuro: Alert/orientedx3, cranial nerves grossly intact. moves all 4 extremities w/o difficulty. Affect pleasant  Telemetry: NSR 70s, occasionaly PVCs,  several small runs of NSVT  Labs: CBC  Recent Labs  04/03/16 0434 04/04/16 0747  WBC 7.5 7.7  HGB 7.8* 8.4*  HCT 24.1* 26.7*  MCV 88.9 90.8  PLT 169 180   Basic Metabolic Panel  Recent Labs  04/02/16 0438 04/03/16 0434 04/03/16 1423 04/04/16 0903  NA 136 135  --  138  K 3.8 4.2  --  4.4  CL 99* 99*  --  101  CO2 26 24  --  25  GLUCOSE 113* 150*  --  141*  BUN 27* 36*  --  41*  CREATININE 2.94* 3.59*  --  4.26*  CALCIUM 8.6* 8.8*  --  9.0  MG  --   --  2.1  --   PHOS 3.6  --   --  5.4*   Liver Function Tests  Recent Labs  04/02/16 0438 04/04/16 0903  ALBUMIN 2.6* 2.6*   No results for input(s): LIPASE, AMYLASE in the last 72 hours. Cardiac Enzymes No results for input(s): CKTOTAL, CKMB, CKMBINDEX, TROPONINI in the last 72 hours.  BNP: BNP (last 3 results)  Recent Labs  03/14/16 1457 03/20/16 1227  BNP 1509.6* 1489.8*    ProBNP (last 3 results) No results for input(s): PROBNP in the last 8760 hours.   D-Dimer No results for input(s): DDIMER in the last 72 hours. Hemoglobin A1C No results for input(s): HGBA1C in the last 72 hours. Fasting Lipid Panel No results for input(s):  CHOL, HDL, LDLCALC, TRIG, CHOLHDL, LDLDIRECT in the last 72 hours. Thyroid Function Tests No results for input(s): TSH, T4TOTAL, T3FREE, THYROIDAB in the last 72 hours.  Invalid input(s): FREET3  Other results:     Imaging/Studies:  No results found.  Latest Echo  Latest Cath   Medications:     Scheduled Medications: . sodium chloride   Intravenous Once  . antiseptic oral rinse  7 mL Mouth Rinse BID  . atorvastatin  10 mg Oral Daily  . budesonide (PULMICORT) nebulizer solution  0.5 mg Nebulization BID  . carvedilol  3.125 mg Oral BID WC  . Darbepoetin Alfa      . darbepoetin (ARANESP) injection - DIALYSIS  100 mcg Intravenous Q Tue-HD  . famotidine  20 mg Oral Daily  . insulin aspart  0-20 Units Subcutaneous TID WC  . insulin aspart  0-5 Units  Subcutaneous QHS  . insulin aspart  4 Units Subcutaneous TID WC  . multivitamin  1 tablet Oral QHS    Infusions: . sodium chloride 10 mL/hr at 04/03/16 0905  . heparin 1,750 Units/hr (04/03/16 1620)    PRN Medications: sodium chloride, sodium chloride, acetaminophen **OR** acetaminophen, albuterol, alteplase, diphenhydrAMINE, heparin, heparin, hydrocortisone cream, lidocaine (PF), lidocaine-prilocaine, LORazepam, ondansetron **OR** ondansetron (ZOFRAN) IV, pentafluoroprop-tetrafluoroeth, phenol   Assessment/Plan   Inamae Stream is a 65 y.o. admitted from [place] place to IM service per notes for further evaluation for C. Diff colitis,  Acute CHF exacerbation, and ARF on CKD stage III. HF team consulted to assist with fluid management.   1. Acute on chronic systolic CHF: Ischemic cardiomyopathy with history of prior CAD (details not available). She has an ICD, thinks Environmental manager. Echo 03/21/16 35-40% with Grade 2 DD, mod dilated RV, mild LAE, mod RAE, Severe TR, Peak PA 38 mm Hg.  - Tolerating IHD.  Volume status stable and per renal at this point.  - Continue coreg 3.125 mg twice a day.  Hold SBP <85. 2. AKI on CKD:  - Has fistula placed.  Now on IHD.  3. R IJ DVT in 3/17:  - On coumadin 4. CAD: History not available. On ASA + Plavix in the past. This was held due to hematuria at last admission (foley trauma) and need to use warfarin. Continue atorvastatin.   5. C difficile colitis: She was on clindamycin recently for MRSA breast abscess, suspect this led to predisposition. 6. Anemia: Had blood in stool, likely related to C difficile colitis and high INR.   - Stable at 8.4 today.  7. Immobility- PT following. 8. NSVT - Electrolytes stable.  ICD device interrogation unremarkable.  Continue to follow K and Mg, goal >4.0 and 2.0 respectively.   Length of Stay: [address]  Graciella Freer PA-C  04/04/2016, 11:32 AM  Advanced Heart Failure Team Pager [phone] (M-F; 7a - 4p)    Please contact [facility] for night-coverage after hours (4p -7a ) and weekends on [email or web address]

## 2016-04-05 LAB — CBC
HCT: 22.2 % — ABNORMAL LOW (ref 36.0–46.0)
Hemoglobin: 7.1 g/dL — ABNORMAL LOW (ref 12.0–15.0)
MCH: 29 pg (ref 26.0–34.0)
MCHC: 32 g/dL (ref 30.0–36.0)
MCV: 90.6 fL (ref 78.0–100.0)
PLATELETS: 168 10*3/uL (ref 150–400)
RBC: 2.45 MIL/uL — AB (ref 3.87–5.11)
RDW: 19.1 % — AB (ref 11.5–15.5)
WBC: 7.4 10*3/uL (ref 4.0–10.5)

## 2016-04-05 LAB — PROTIME-INR
INR: 1.49 (ref 0.00–1.49)
PROTHROMBIN TIME: 18.1 s — AB (ref 11.6–15.2)

## 2016-04-05 LAB — GLUCOSE, CAPILLARY
GLUCOSE-CAPILLARY: 125 mg/dL — AB (ref 65–99)
GLUCOSE-CAPILLARY: 124 mg/dL — AB (ref 65–99)
Glucose-Capillary: 149 mg/dL — ABNORMAL HIGH (ref 65–99)
Glucose-Capillary: 135 mg/dL — ABNORMAL HIGH (ref 65–99)

## 2016-04-05 LAB — RENAL FUNCTION PANEL
ALBUMIN: 2.5 g/dL — AB (ref 3.5–5.0)
Anion gap: 11 (ref 5–15)
BUN: 23 mg/dL — AB (ref 6–20)
CHLORIDE: 97 mmol/L — AB (ref 101–111)
CO2: 28 mmol/L (ref 22–32)
CREATININE: 3.59 mg/dL — AB (ref 0.44–1.00)
Calcium: 8.6 mg/dL — ABNORMAL LOW (ref 8.9–10.3)
GFR calc Af Amer: 14 mL/min — ABNORMAL LOW (ref >60)
GFR, EST NON AFRICAN AMERICAN: 12 mL/min — AB (ref >60)
GLUCOSE: 145 mg/dL — AB (ref 65–99)
Phosphorus: 4.1 mg/dL (ref 2.5–4.6)
Potassium: 3.7 mmol/L (ref 3.5–5.1)
Sodium: 136 mmol/L (ref 135–145)

## 2016-04-05 LAB — PARATHYROID HORMONE, INTACT (NO CA): PTH: 77 pg/mL — ABNORMAL HIGH (ref 15–65)

## 2016-04-05 LAB — HEPARIN LEVEL (UNFRACTIONATED): HEPARIN UNFRACTIONATED: 0.56 [IU]/mL (ref 0.30–0.70)

## 2016-04-05 MED ORDER — SODIUM CHLORIDE 0.9 % IV SOLN
125.0000 mg | INTRAVENOUS | Status: DC
  Administered 2016-04-08: 125 mg via INTRAVENOUS
  Filled 2016-04-05 (×2): qty 10

## 2016-04-05 MED ORDER — WARFARIN SODIUM 5 MG PO TABS
5.0000 mg | ORAL_TABLET | Freq: Once | ORAL | Status: AC
  Administered 2016-04-05: 5 mg via ORAL
  Filled 2016-04-05: qty 1

## 2016-04-05 NOTE — Progress Notes (Signed)
Patient was complaining of surgical pain in her left arm due to recent fistula placement.  Pain was 6 out of 10.  Tylenol administered, and patient was educated to call RN if pain does not decrease.  RN assessed an hour later, and patient was sleeping comfortably.  Later, while turning patient, she stated her arm was still sore; however, she only wanted to reposition her arm.  Additional pillow placed under her arm for comfort.  Will continue to monitor.

## 2016-04-05 NOTE — Progress Notes (Signed)
[facility] Rounding Note  Subjective/Interim Events:  Doing well this morning. Mild pain in her left arm near the surgical site, otherwise no complaints.  Had HD yesterday  Objective: Vital signs in last 24 hours: Temp:  [97.8 F (36.6 C)-98.9 F (37.2 C)] 97.8 F (36.6 C) (05/10 0451) Pulse Rate:  [65-73] 65 (05/10 0451) Resp:  [14-20] 18 (05/10 0451) BP: (89-102)/(48-57) 94/50 mmHg (05/10 0451) SpO2:  [92 %-100 %] 100 % (05/10 0451) Weight:  [231 lb 7.7 oz (105 kg)-234 lb 5.6 oz (106.3 kg)] 231 lb 7.7 oz (105 kg) (05/09 2145) Weight change: -7 lb 11.5 oz (-3.5 kg)  Intake/Output from previous day: 05/09 0701 - 05/10 0700 In: 1590 [P.O.:600; I.V.:990] Out: 1004  Intake/Output this shift:   Physical Examination BP 94/50 mmHg  Pulse 65  Temp(Src) 97.8 F (36.6 C) (Oral)  Resp 18  Ht 5\' 4"  (1.626 m)  Wt 231 lb 7.7 oz (105 kg)  BMI 39.71 kg/m2  SpO2 100%  General: 65 yo female in NAD, lying in bed CV: RRR PULM: NWOB, CTA Extremities: edema trace  Skin: Entrance wound of previous right inguinal catheter, no drainage noted  Access: -New left fem TDC with dsg in place  -Left upper arm BC AVF + very faint bruit (5/8), no palpable thrill   Lab Results:  Recent Labs  04/04/16 0747 04/05/16 0506  WBC 7.7 7.4  HGB 8.4* 7.1*  HCT 26.7* 22.2*  PLT 180 168   PTH  Date/Time Value Ref Range Status  04/04/2016 09:10 AM 77* 15 - 65 pg/mL Final    Comment:    (NOTE) Performed At: [facility] [address] [place], [place] [id number] Mila Homer MD PO:[id number]    Results for CORRISA, REPPOND (MRN [id number]) as of 04/05/2016 14:02  Ref. Range 04/04/2016 07:48  Iron Latest Ref Range: 28-170 ug/dL 31  UIBC Latest Units: ug/dL 282  TIBC Latest Ref Range: 250-450 ug/dL 060  Saturation Ratios Latest Ref Range: 10.4-31.8 % 9 (L)    Recent Labs  04/04/16 0903 04/05/16 0500  NA 138 136  K 4.4 3.7  CL 101 97*  CO2 25 28  GLUCOSE 141* 145*  BUN 41* 23*   CREATININE 4.26* 3.59*  CALCIUM 9.0 8.6*   PHOSPHORUS  Date Value Ref Range Status  04/05/2016 4.1 2.5 - 4.6 mg/dL Final  15/61/5379 5.4* 2.5 - 4.6 mg/dL Final  43/27/6147 3.6 2.5 - 4.6 mg/dL Final  08/25/5746 2.2* 2.5 - 4.6 mg/dL Final  34/01/7095 3.5 2.5 - 4.6 mg/dL Final    Scheduled Medications: . sodium chloride   Intravenous Once  . antiseptic oral rinse  7 mL Mouth Rinse BID  . atorvastatin  10 mg Oral Daily  . budesonide (PULMICORT) nebulizer solution  0.5 mg Nebulization BID  . carvedilol  3.125 mg Oral BID WC  . darbepoetin (ARANESP) injection - DIALYSIS  100 mcg Intravenous Q Tue-HD  . famotidine  20 mg Oral Daily  . insulin aspart  0-20 Units Subcutaneous TID WC  . insulin aspart  0-5 Units Subcutaneous QHS  . insulin aspart  4 Units Subcutaneous TID WC  . multivitamin  1 tablet Oral QHS  . Warfarin - Pharmacist Dosing Inpatient   Does not apply q1800    Background Pt is a 65 y.o. yo female who was admitted on 03/20/2016 with C Diff colitis and acute respiratory distress/ AoCHF, developed AKI on CKD->new ESRD d/t cardiorenal syndrome. Started on CVVHD and transitioned to IHD on 5/1.  Right femoral dialysis catheter removed 5/6. Left femoral Tunneled dialysis catheter placed on 5/9. Left upper arm AVF placed 5/8.   Other probs COPD, DM2, AICD in place.   1. 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 HD TTS via left femoral TDC 2. Has outpt HD spot at [facility] TTS 2nd shift 2. Anemia/Thrombocytopenia: Hgb 7-8 during this admission. Ferritin 181, fe 31 1. Aranesp 100 every Tuesday with HD (started 5/9) 2. I would favor transfusion of 1 unit with HD tomorrow 3. Iron load with HD - 8 doses ferrlecit with HD for Tsat of 9%.  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 Phos good no binders 4. Vascular access - L fem TDC/L BC AVF (5/8 Dr. Arbie Cookey). On same side as the ICD and the bruit is not well heard. May not mature.  5. H/o DVT -  Pharmacy  following/ management per primary 6. GI bleed- Reports blood in stool, management per primary 7. CAD/iCM/CHF- cards managing. 8. DM2: SSI per primary 9. C Diff colitis- on Vanc PO per primary. 10. Decubitus ulcers with another wound in right groin. Wound care has seen. See their note of 5/9 11. Disposition - ? Discharge soon?  Katina Degree. Jimmey Ralph, MD [facility] Family Medicine Resident PGY-2 04/05/2016 9:11 AM   I have seen and examined this patient and agree with plan and assessment in the above note with highlighted additions. Next HD tomorrow. I favor transfusion of 1 unit for Hb 7.1  With HD. Needs Fe load - ordered. I think EDW will be about 105 kg.  Melessia Kaus B,MD 04/05/2016 2:09 PM

## 2016-04-05 NOTE — Progress Notes (Signed)
ANTICOAGULATION CONSULT NOTE   Pharmacy Consult for Heparin and warfarin Indication: hx DVT  Allergies  Allergen Reactions  . Entresto [Sacubitril-Valsartan] Other (See Comments)    On MAR    Patient Measurements: Height: 5\' 4"  (162.6 cm) Weight: 231 lb 7.7 oz (105 kg) IBW/kg (Calculated) : 54.7 Heparin Dosing Weight: 80kg  Vital Signs: Temp: 98.6 F (37 C) (05/10 0905) Temp Source: Oral (05/10 0905) BP: 95/55 mmHg (05/10 0905) Pulse Rate: 94 (05/10 0905)  Labs:  Recent Labs  04/03/16 0434 04/04/16 0747 04/04/16 0903 04/05/16 0500 04/05/16 0506  HGB 7.8* 8.4*  --   --  7.1*  HCT 24.1* 26.7*  --   --  22.2*  PLT 169 180  --   --  168  LABPROT 17.4* 19.2*  --   --  18.1*  INR 1.41 1.61*  --   --  1.49  HEPARINUNFRC 0.37 0.56  --  0.56  --   CREATININE 3.59*  --  4.26* 3.59*  --     Estimated Creatinine Clearance: 18.4 mL/min (by C-G formula based on Cr of 3.59).  Assessment: 64 yo F on warfarin pta for recent RIJ DVT. INR on admit supratherapeutic - warfarin was held and reversed for central line and temp HD cath placement. Warfarin was then on hold for permanent HD cath placement. Now S/P #1 L. femoral tunneled HD cath, #2 L. upper arm brachiocephalic AV fistula creation on 5/8. Pharmacy restarted warfarin 5/9:  - 5/4 vit K 5mg  po given - 5/5 vit K 10mg  po given - 5/7 vit K 5 mg po given   INR today is 1.49. Heparin level this morning was therapeutic at 0.56.  Hgb 8.4>7.1, plts 180>168.   No bleeding reported.   Warfarin PTA dose: 4.5 mg daily Goal of Therapy:  Heparin level 0.3-0.7 units/ml Monitor platelets by anticoagulation protocol: Yes   Plan:   Continue Heparin @ 1750 units/hr (17.5 ml/hr) repeat warfarin 5 mg PO x 1 today  Monitor daily INR, HL, CBC, clinical course, s/sx of bleed, PO intake, DDI  Thank you for allowing [place] to participate in this patients care.  Herby Abraham, Pharm.D. [phone] 04/05/2016 1:57 PM

## 2016-04-05 NOTE — Progress Notes (Signed)
Pt H&H 7.1 & 22.2 today, paged Dr. Izola Price, [place] Childes

## 2016-04-05 NOTE — Progress Notes (Signed)
PROGRESS NOTE    Brandy Dudley  ZOX:[id number] DOB: 08/17/1951 DOA: 03/20/2016 PCP: PROVIDER NOT IN SYSTEM   Brief Narrative:  Brandy Dudley is a 65 y.o. admitted from [place] place to IM service per notes for further evaluation for C. Diff colitis, Acute CHF exacerbation, and ARF on CKD stage III. HF team consulted to assist with fluid management.  Found to need HD  And awaiting INR of < 1.3 for perm cath and AV fistula  Assessment & Plan:   C difficile colitis - on clindamycin recently for MRSA breast abscess - s/p po vancomycin until  5/8  Acute on chronic systolic CHF: Ischemic cardiomyopathy with history of prior CAD  - Echo 03/21/16 35-40% with Grade 2 DD, mod dilated RV, mild LAE, mod RAE, Severe TR, Peak PA 38 mm Hg. - Continue coreg 3.125 mg twice a day. Hold SBP <85. - appreciate HF team   AKI on CKD, now ESRD - per nephrology team   R IJ DVT in 3/17 - resumed Coumadin   CAD - On ASA + Plavix in the past. - held due to hematuria at last admission (foley trauma) - continue statin      Anemia of chronic disease, ? GI bleed, Thrombocytopenia  - Ferritin 181, Fe 31 - aranesp 100 Q Tuesday with HD - plan to transfuse one unit PRBC with HD in AM - CBC In AM   Immobility - SNF eventually (from [facility])  Macerated skin - WOC consult: Foam dressing to protect and promote healing to right lower breast.  - Aquacel to right groin to absorb drainage and provide antimicrobilal benefits.  - Barrier cream to inner gluteal fold and buttocks to protect and repel moisture.   DM type II with complications of nephropathy  - on SSI for now  Morbid Obesity  - Body mass index is 39.71 kg/(m^2).  DVT prophylaxis: restarted coumadin 5/9  Code Status: Full Code  Family Communication: No family at bedside  Disposition Plan: From [facility]-- back when medically ready  Consultants:   CHF team  PCCM  Nephrology   Procedures:   None  Subjective: Reports  feeling better.   Objective: Filed Vitals:   04/04/16 2145 04/05/16 0451 04/05/16 0905 04/05/16 1800  BP: 115/63  Pulse: 69 65 94 67  Temp: 98.9 F (37.2 C) 97.8 F (36.6 C) 98.6 F (37 C) 98.3 F (36.8 C)  TempSrc:   Oral Oral  Resp: Height:      Weight: 105 kg (231 lb 7.7 oz)     SpO2:  100% 96% 98%    Intake/Output Summary (Last 24 hours) at 04/05/16 2026 Last data filed at 04/05/16 1700  Gross per 24 hour  Intake    810 ml  Output     15 ml  Net    795 ml   Filed Weights   04/04/16 0721 04/04/16 1121 04/04/16 2145  Weight: 107.2 kg (236 lb 5.3 oz) 106.3 kg (234 lb 5.6 oz) 105 kg (231 lb 7.7 oz)   Examination:  General exam: Appears calm and comfortable  Respiratory system: Clear to auscultation. Respiratory effort normal. Cardiovascular system: S1 & S2 heard, RRR. +edema Gastrointestinal system: Abdomen is nondistended, soft and nontender.  Central nervous system: Alert and oriented. No focal neurological deficits. Extremities: new left fem TDC with dressing in place, LUE BC AVF with faint bruit and no palpable thrill, trace B LE edema   Data Reviewed:  I have personally reviewed following labs and imaging studies  CBC:  Recent Labs Lab 04/01/16 1330 04/02/16 0444 04/03/16 0434 04/04/16 0747 04/05/16 0506  WBC 8.1 7.9 7.5 7.7 7.4  HGB 7.6* 7.4* 7.8* 8.4* 7.1*  HCT 23.6* 23.2* 24.1* 26.7* 22.2*  MCV 88.4 88.9 88.9 90.8 90.6  PLT 142* 149* 169 180 168   Basic Metabolic Panel:  Recent Labs Lab 03/30/16 0845  03/31/16 1945 04/02/16 0438 04/03/16 0434 04/03/16 1423 04/04/16 0903 04/05/16 0500  NA 139  < > 137 136 135  --  138 136  K 3.6  < > 3.2* 3.8 4.2  --  4.4 3.7  CL 104  < > 99* 99* 99*  --  101 97*  CO2 27  < > 28 26 24   --  25 28  GLUCOSE 156*  < > 161* 113* 150*  --  141* 145*  BUN 34*  < > 15 27* 36*  --  41* 23*  CREATININE 2.85*  < > 1.62* 2.94* 3.59*  --  4.26* 3.59*  CALCIUM 8.7*  < > 8.2* 8.6* 8.8*   --  9.0 8.6*  MG  --   --   --   --   --  2.1  --   --   PHOS 3.5  --  2.2* 3.6  --   --  5.4* 4.1  < > = values in this interval not displayed.  Liver Function Tests:  Recent Labs Lab 03/30/16 0845 03/31/16 1945 04/02/16 0438 04/04/16 0903 04/05/16 0500  ALBUMIN 2.7* 2.6* 2.6* 2.6* 2.5*   Coagulation Profile:  Recent Labs Lab 04/01/16 0530 04/02/16 0444 04/03/16 0434 04/04/16 0747 04/05/16 0506  INR 1.47 1.42 1.41 1.61* 1.49   CBG:  Recent Labs Lab 04/04/16 1622 04/04/16 2133 04/05/16 0759 04/05/16 1124 04/05/16 1614  GLUCAP 159* 167* 125* 149* 135*   Anemia Panel:  Recent Labs  04/04/16 0748  FERRITIN 181  TIBC 328  IRON 31   Urine analysis:    Component Value Date/Time   COLORURINE AMBER* 03/14/2016 1655   APPEARANCEUR CLOUDY* 03/14/2016 1655   LABSPEC 1.019 03/14/2016 1655   PHURINE 5.0 03/14/2016 1655   GLUCOSEU NEGATIVE 03/14/2016 1655   HGBUR LARGE* 03/14/2016 1655   BILIRUBINUR SMALL* 03/14/2016 1655   KETONESUR 15* 03/14/2016 1655   PROTEINUR 30* 03/14/2016 1655   NITRITE NEGATIVE 03/14/2016 1655   LEUKOCYTESUR TRACE* 03/14/2016 1655    Anti-infectives    Start     Dose/Rate Route Frequency Ordered Stop   04/03/16 0000  ceFAZolin (ANCEF) IVPB 1 g/50 mL premix  Status:  Discontinued    Comments:  Send with pt to OR   1 g 100 mL/hr over 30 Minutes Intravenous On call 04/02/16 0724 04/02/16 1350   04/03/16 0000  ceFAZolin (ANCEF) IVPB 2g/100 mL premix    Comments:  Send with pt to OR   2 g 200 mL/hr over 30 Minutes Intravenous On call 04/02/16 1350 04/03/16 1200   03/29/16 1127  cefUROXime (ZINACEF) 1.5 g in dextrose 5 % 50 mL IVPB     1.5 g 100 mL/hr over 30 Minutes Intravenous To [facility]Stay [facility] 03/28/16 2305 03/30/16 1130   03/23/16 1400  vancomycin (VANCOCIN) 50 mg/mL oral solution 250 mg     250 mg Oral 4 times daily 03/23/16 1051 04/03/16 1759   03/20/16 1800  vancomycin (VANCOCIN) 50 mg/mL oral solution 125 mg  Status:   Discontinued     125 mg Oral 4  times daily 03/20/16 1637 03/23/16 1051   03/20/16 1230  metroNIDAZOLE (FLAGYL) IVPB 500 mg  Status:  Discontinued     500 mg 100 mL/hr over 60 Minutes Intravenous Every 8 hours 03/20/16 1218 03/20/16 1538     Radiology Studies: No results found.  Scheduled Meds: . sodium chloride   Intravenous Once  . antiseptic oral rinse  7 mL Mouth Rinse BID  . atorvastatin  10 mg Oral Daily  . budesonide (PULMICORT) nebulizer solution  0.5 mg Nebulization BID  . carvedilol  3.125 mg Oral BID WC  . darbepoetin (ARANESP) injection - DIALYSIS  100 mcg Intravenous Q Tue-HD  . famotidine  20 mg Oral Daily  . [START ON 04/06/2016] ferric gluconate (FERRLECIT/NULECIT) IV  125 mg Intravenous Q T,Th,Sa-HD  . insulin aspart  0-20 Units Subcutaneous TID WC  . insulin aspart  0-5 Units Subcutaneous QHS  . insulin aspart  4 Units Subcutaneous TID WC  . multivitamin  1 tablet Oral QHS  . Warfarin - Pharmacist Dosing Inpatient   Does not apply q1800   Continuous Infusions: . sodium chloride 10 mL/hr at 04/03/16 0905  . heparin 1,750 Units/hr (04/05/16 0941)     LOS: 16 days   Time spent: 25 min  Debbora Presto, MD [facility] Pager [phone]  If 7PM-7AM, please contact night-coverage [email or web address] Password [email or web address] 04/05/2016, 8:26 PM

## 2016-04-06 LAB — PROTIME-INR
INR: 1.51 — AB (ref 0.00–1.49)
PROTHROMBIN TIME: 18.3 s — AB (ref 11.6–15.2)

## 2016-04-06 LAB — CBC
HCT: 22.1 % — ABNORMAL LOW (ref 36.0–46.0)
HEMOGLOBIN: 7.1 g/dL — AB (ref 12.0–15.0)
MCH: 28.7 pg (ref 26.0–34.0)
MCHC: 32.1 g/dL (ref 30.0–36.0)
MCV: 89.5 fL (ref 78.0–100.0)
Platelets: 195 10*3/uL (ref 150–400)
RBC: 2.47 MIL/uL — ABNORMAL LOW (ref 3.87–5.11)
RDW: 19.1 % — ABNORMAL HIGH (ref 11.5–15.5)
WBC: 6.7 10*3/uL (ref 4.0–10.5)

## 2016-04-06 LAB — RENAL FUNCTION PANEL
Albumin: 2.5 g/dL — ABNORMAL LOW (ref 3.5–5.0)
Anion gap: 13 (ref 5–15)
BUN: 28 mg/dL — ABNORMAL HIGH (ref 6–20)
CALCIUM: 8.9 mg/dL (ref 8.9–10.3)
CO2: 25 mmol/L (ref 22–32)
CREATININE: 4.45 mg/dL — AB (ref 0.44–1.00)
Chloride: 97 mmol/L — ABNORMAL LOW (ref 101–111)
GFR calc non Af Amer: 10 mL/min — ABNORMAL LOW (ref >60)
GFR, EST AFRICAN AMERICAN: 11 mL/min — AB (ref >60)
GLUCOSE: 134 mg/dL — AB (ref 65–99)
Phosphorus: 4.6 mg/dL (ref 2.5–4.6)
Potassium: 3.9 mmol/L (ref 3.5–5.1)
SODIUM: 135 mmol/L (ref 135–145)

## 2016-04-06 LAB — HEPARIN LEVEL (UNFRACTIONATED)
HEPARIN UNFRACTIONATED: 0.17 [IU]/mL — AB (ref 0.30–0.70)
Heparin Unfractionated: 0.25 IU/mL — ABNORMAL LOW (ref 0.30–0.70)

## 2016-04-06 LAB — GLUCOSE, CAPILLARY
GLUCOSE-CAPILLARY: 123 mg/dL — AB (ref 65–99)
GLUCOSE-CAPILLARY: 135 mg/dL — AB (ref 65–99)
Glucose-Capillary: 126 mg/dL — ABNORMAL HIGH (ref 65–99)

## 2016-04-06 LAB — PREPARE RBC (CROSSMATCH)

## 2016-04-06 MED ORDER — WARFARIN SODIUM 5 MG PO TABS
5.0000 mg | ORAL_TABLET | Freq: Once | ORAL | Status: AC
  Administered 2016-04-07: 5 mg via ORAL
  Filled 2016-04-06: qty 1

## 2016-04-06 MED ORDER — SODIUM CHLORIDE 0.9 % IV SOLN: Freq: Once | INTRAVENOUS | Status: DC

## 2016-04-06 MED ORDER — BUDESONIDE 0.5 MG/2ML IN SUSP
0.5000 mg | Freq: Two times a day (BID) | RESPIRATORY_TRACT | Status: DC
  Administered 2016-04-07 – 2016-04-10 (×6): 0.5 mg via RESPIRATORY_TRACT
  Filled 2016-04-06 (×7): qty 2

## 2016-04-06 NOTE — Progress Notes (Signed)
[facility] Rounding Note  Subjective/Interim Events:  Doing well. Pain in left arm improved. Will be getting HD later today.   Objective: Vital signs in last 24 hours: Temp:  [98.1 F (36.7 C)-98.6 F (37 C)] 98.5 F (36.9 C) (05/11 0846) Pulse Rate:  [67-94] 74 (05/11 0846) Resp:  [17-18] 17 (05/11 0846) BP: (95-120)/(50-66) 119/66 mmHg (05/11 0846) SpO2:  [95 %-100 %] 98 % (05/11 0846) Weight:  [239 lb 10.2 oz (108.7 kg)] 239 lb 10.2 oz (108.7 kg) (05/11 0544) Weight change: 3 lb 4.9 oz (1.5 kg)  Intake/Output from previous day: 05/10 0701 - 05/11 0700 In: 672.1 [P.O.:240; I.V.:432.1] Out: 15 [Urine:15] Intake/Output this shift:   Physical Examination BP 119/66 mmHg  Pulse 74  Temp(Src) 98.5 F (36.9 C) (Oral)  Resp 17  Ht 5\' 4"  (1.626 m)  Wt 239 lb 10.2 oz (108.7 kg)  BMI 41.11 kg/m2  SpO2 98%  General: 65 yo female in NAD, lying in bed CV: RRR PULM: NWOB, CTA Extremities: edema trace  Skin: Entrance wound of previous right inguinal catheter, no drainage noted  Access: -New left fem TDC with dsg in place  -Left upper arm BC AVF + very faint bruit (5/8), no palpable thrill   Lab Results:  Recent Labs  04/05/16 0506 04/06/16 0358  WBC 7.4 6.7  HGB 7.1* 7.1*  HCT 22.2* 22.1*  PLT 168 195   PTH  Date/Time Value Ref Range Status  04/04/2016 09:10 AM 77* 15 - 65 pg/mL Final    Comment:    (NOTE) Performed At: [facility] [address] [place], [place] [id number] Mila Homer MD OF:[id number]    Results for JASMIEN, BADR (MRN [id number]) as of 04/05/2016 14:02  Ref. Range 04/04/2016 07:48  Iron Latest Ref Range: 28-170 ug/dL 31  UIBC Latest Units: ug/dL 615  TIBC Latest Ref Range: 250-450 ug/dL 183  Saturation Ratios Latest Ref Range: 10.4-31.8 % 9 (L)    Recent Labs  04/05/16 0500 04/06/16 0350  NA 136 135  K 3.7 3.9  CL 97* 97*  CO2 28 25  GLUCOSE 145* 134*  BUN 23* 28*  CREATININE 3.59* 4.45*  CALCIUM 8.6* 8.9   PHOSPHORUS  Date  Value Ref Range Status  04/06/2016 4.6 2.5 - 4.6 mg/dL Final  43/73/5789 4.1 2.5 - 4.6 mg/dL Final  78/47/8412 5.4* 2.5 - 4.6 mg/dL Final  82/06/1387 3.6 2.5 - 4.6 mg/dL Final  71/95/9747 2.2* 2.5 - 4.6 mg/dL Final    Scheduled Medications: . sodium chloride   Intravenous Once  . antiseptic oral rinse  7 mL Mouth Rinse BID  . atorvastatin  10 mg Oral Daily  . budesonide (PULMICORT) nebulizer solution  0.5 mg Nebulization BID  . carvedilol  3.125 mg Oral BID WC  . darbepoetin (ARANESP) injection - DIALYSIS  100 mcg Intravenous Q Tue-HD  . famotidine  20 mg Oral Daily  . ferric gluconate (FERRLECIT/NULECIT) IV  125 mg Intravenous Q T,Th,Sa-HD  . insulin aspart  0-20 Units Subcutaneous TID WC  . insulin aspart  0-5 Units Subcutaneous QHS  . insulin aspart  4 Units Subcutaneous TID WC  . multivitamin  1 tablet Oral QHS  . Warfarin - Pharmacist Dosing Inpatient   Does not apply q1800    Background Pt is a 65 y.o. yo female who was admitted on 03/20/2016 with C Diff colitis and acute respiratory distress/ AoCHF, developed AKI on CKD->new ESRD d/t cardiorenal syndrome. Started on CVVHD and transitioned to IHD on 5/1.  Right femoral dialysis catheter removed 5/6. Left femoral tunneled dialysis catheter placed on 5/9. Left upper arm AVF placed 5/8.   Other probs COPD, DM2, AICD in place.   1. 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 HD TTS via left femoral TDC 2. Has outpt HD spot at [facility] TTS 2nd shift 3. EDW around 105kg 2. Anemia/Thrombocytopenia: Hgb 7-8 during this admission. Ferritin 181, fe 31 1. Aranesp 100 every Tuesday with HD (started 5/9) 2. 1u pRBC with HD today 3. 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 Phos good no binders 4. Vascular access - L fem TDC/L BC AVF (5/8 Dr. Arbie Cookey).  1. On same side as the ICD and the bruit is not well heard. May not mature.  5. H/o DVT R IJ -   Pharmacy following/ management per primary- at discharge will need coumadin monitoring by primary or coumadin clinic - not at the HD unit 6. GI bleed- Reports blood in stool, management per primary 7. CAD/iCM/CHF- cards managing. 8. DM2: SSI per primary 9. C Diff colitis- on Vanc PO per primary. 10. Decubitus ulcers with another wound in right groin. Wound care has seen. See their note of 5/9 11. Disposition - ? Discharge soon?  Katina Degree. Jimmey Ralph, MD [facility] Family Medicine Resident PGY-2 04/06/2016 8:54 AM   I have seen and examined this patient and agree with plan and assessment in the above note.  Has outpt HD TTS 2nd shift spot at [place] (but cannot start on a Saturday, so anticipate will need to stay through the weekend before can return to SNF).   For HD today.  Transfuse 1 unit with HD.   Also will need arrangements made for coumadin monitoring at the time of discharge.  Wister Hoefle B,MD 04/06/2016 1:00 PM

## 2016-04-06 NOTE — Progress Notes (Signed)
ANTICOAGULATION CONSULT NOTE - Follow Up Consult  Pharmacy Consult for Heparin Indication: Recent DVT (March '17)  Allergies  Allergen Reactions  . Entresto [Sacubitril-Valsartan] Other (See Comments)    On MAR    Patient Measurements: Height: 5\' 4"  (162.6 cm) Weight: 240 lb 4.8 oz (109 kg) IBW/kg (Calculated) : 54.7 Heparin Dosing Weight: 80 kg  Vital Signs: Temp: 97.8 F (36.6 C) (05/11 1914) Temp Source: Oral (05/11 1914) BP: 106/62 mmHg (05/11 2030) Pulse Rate: 77 (05/11 2030)  Labs:  Recent Labs  04/04/16 0747 04/04/16 0903 04/05/16 0500 04/05/16 0506 04/06/16 0350 04/06/16 0358 04/06/16 1936  HGB 8.4*  --   --  7.1*  --  7.1*  --   HCT 26.7*  --   --  22.2*  --  22.1*  --   PLT 180  --   --  168  --  195  --   LABPROT 19.2*  --   --  18.1*  --  18.3*  --   INR 1.61*  --   --  1.49  --  1.51*  --   HEPARINUNFRC 0.56  --  0.56  --   --  0.17* 0.25*  CREATININE  --  4.26* 3.59*  --  4.45*  --   --     Estimated Creatinine Clearance: 15.2 mL/min (by C-G formula based on Cr of 4.45).   Medications:  Heparin @ 1900 units/hr (19 ml/hr)  Assessment: 65 yo F on warfarin pta for recent RIJ DVT. INR on admit supratherapeutic - warfarin was held and reversed for central line and temp HD cath placement. Warfarin was then on hold for permanent HD cath placement. Now S/P #1 L. femoral tunneled HD cath, #2 L. upper arm brachiocephalic AV fistula creation on 5/8. Pharmacy restarted warfarin 5/9.  Heparin level this evening remains SUBtherapeutic despite a rate increase earlier today (HL 0.25 << 0.17, goal of 0.3-0.7). CBC stable from this AM - no overt s/sx of bleeding noted. Upon arrival to HD - the heparin bag was empty requiring replacement. It is unclear how long the bag had been dry - so will only increase the drip rate slightly this evening.   Goal of Therapy:  Heparin level 0.3-0.7 units/ml Monitor platelets by anticoagulation protocol: Yes   Plan:  1. Increase  Heparin to 2000 units/hr (20 ml/hr) 2. Will continue to monitor for any signs/symptoms of bleeding and will follow up with heparin level in 8 hours   Georgina Pillion, PharmD, BCPS Clinical Pharmacist Pager: [phone] 04/06/2016 8:52 PM

## 2016-04-06 NOTE — Progress Notes (Addendum)
Physical Therapy Treatment Patient Details Name: Brandy Dudley MRN: [id number] DOB: 1951/08/03 Today's Date: 04/06/2016    History of Present Illness Pt is a 65 y/o F admitted on 03/20/16 for C diff and acute respiratory distress/CHF.  She was recently hospitalized from 3/15-4/05 in [place], requiring HD at that time and s/p ICD.  Pt's PMH includes CHF, COPD, DVT.    PT Comments    Significant progress with mobility today, she ambulated 81' with RW and min/guard assist. SaO2 93%, HR 86 with ambulation. Pt stated this is the first time she's walked since March.   Follow Up Recommendations  SNF;Supervision/Assistance - 24 hour     Equipment Recommendations  Other (comment) (TBD at next venue of care)    Recommendations for Other Services       Precautions / Restrictions Precautions Precautions: Fall Restrictions Weight Bearing Restrictions: No    Mobility  Bed Mobility Overal bed mobility: Needs Assistance Bed Mobility: Supine to Sit     Supine to sit: Mod assist     General bed mobility comments: Mod assist to help LEs off of the bed and elevate trunk to sit  Transfers Overall transfer level: Needs assistance Equipment used: Rolling walker (2 wheeled) Transfers: Sit to/from Stand Sit to Stand: Min assist         General transfer comment: min A to rise/steady, VCs hand placement  Ambulation/Gait Ambulation/Gait assistance: Min guard Ambulation Distance (Feet): 40 Feet Assistive device: Rolling walker (2 wheeled) Gait Pattern/deviations: Step-through pattern;Decreased stride length   Gait velocity interpretation: Below normal speed for age/gender General Gait Details: distance limited by fatigue and dizziness, SaO2 93% on RA, HR 86 with walking; dizziness resolved with seated rest. No dynamap available to check BP.    Stairs            Wheelchair Mobility    Modified Rankin (Stroke Patients Only)       Balance     Sitting balance-Leahy Scale:  Fair       Standing balance-Leahy Scale: Poor                      Cognition Arousal/Alertness: Awake/alert Behavior During Therapy: WFL for tasks assessed/performed Overall Cognitive Status: Within Functional Limits for tasks assessed                      Exercises      General Comments        Pertinent Vitals/Pain Pain Assessment: No/denies pain    Home Living                      Prior Function            PT Goals (current goals can now be found in the care plan section) Acute Rehab PT Goals Patient Stated Goal: "I want to be able to walk", "I had a dream that I could walk" PT Goal Formulation: With patient Time For Goal Achievement: 04/18/16 (Goals updated today) Potential to Achieve Goals: Good Progress towards PT goals: Progressing toward goals    Frequency  Min 3X/week    PT Plan Current plan remains appropriate    Co-evaluation             End of Session   Activity Tolerance: Patient tolerated treatment well Patient left: in chair;with call Bouwman/phone within reach;with chair alarm set     Time: 6962-9528 PT Time Calculation (min) (ACUTE ONLY): 23 min  Charges:  $  Gait Training: 8-22 mins $Therapeutic Activity: 8-22 mins                    G Codes:      Tamala Ser 04/06/2016, 9:36 AM [phone]

## 2016-04-06 NOTE — Progress Notes (Addendum)
ANTICOAGULATION CONSULT NOTE   Pharmacy Consult for Heparin and warfarin Indication: hx DVT  Allergies  Allergen Reactions  . Entresto [Sacubitril-Valsartan] Other (See Comments)    On MAR    Patient Measurements: Height: 5\' 4"  (162.6 cm) Weight: 239 lb 10.2 oz (108.7 kg) IBW/kg (Calculated) : 54.7 Heparin Dosing Weight: 80kg  Vital Signs: Temp: 98.5 F (36.9 C) (05/11 0846) Temp Source: Oral (05/11 0846) BP: 119/66 mmHg (05/11 0846) Pulse Rate: 86 (05/11 0931)  Labs:  Recent Labs  04/04/16 0747 04/04/16 0903 04/05/16 0500 04/05/16 0506 04/06/16 0350 04/06/16 0358  HGB 8.4*  --   --  7.1*  --  7.1*  HCT 26.7*  --   --  22.2*  --  22.1*  PLT 180  --   --  168  --  195  LABPROT 19.2*  --   --  18.1*  --  18.3*  INR 1.61*  --   --  1.49  --  1.51*  HEPARINUNFRC 0.56  --  0.56  --   --  0.17*  CREATININE  --  4.26* 3.59*  --  4.45*  --     Estimated Creatinine Clearance: 15.2 mL/min (by C-G formula based on Cr of 4.45).  Assessment: 65 yo F on warfarin pta for recent RIJ DVT. INR on admit supratherapeutic - warfarin was held and reversed for central line and temp HD cath placement. Warfarin was then on hold for permanent HD cath placement. Now S/P #1 L. femoral tunneled HD cath, #2 L. upper arm brachiocephalic AV fistula creation on 5/8. Pharmacy restarted warfarin 5/9:  - 5/4 vit K 5mg  po given - 5/5 vit K 10mg  po given - 5/7 vit K 5 mg po given   INR today is 151. Heparin level this morning was subtherapeutic at 0.17 on 1750 units/hr and rate was increased to 1900 units/hr.  Hgb 8.4>7.1>7.1, plts 180>168>195.   No bleeding reported.   Warfarin PTA dose: 4.5 mg daily Goal of Therapy:  Heparin level 0.3-0.7 units/ml Monitor platelets by anticoagulation protocol: Yes   Plan:  Heparin rate increased to 1900 units/hr at 0543, repeat HL not drawn yet (due at 1330) - HD RN agreed to draw HL at beginning of HD to save pt peripheral stick repeat warfarin 5 mg PO x 1  today  Monitor daily INR, HL, CBC, clinical course, s/sx of bleed, PO intake, DDI  Thank you for allowing [place] to participate in this patients care.  Herby Abraham, Pharm.D. [phone] 04/06/2016 2:55 PM

## 2016-04-06 NOTE — Progress Notes (Signed)
ANTICOAGULATION CONSULT NOTE - Follow Up Consult  Pharmacy Consult for Heparin  Indication: Hx DVT  Allergies  Allergen Reactions  . Entresto [Sacubitril-Valsartan] Other (See Comments)    On MAR    Patient Measurements: Height: 5\' 4"  (162.6 cm) Weight: 231 lb 7.7 oz (105 kg) IBW/kg (Calculated) : 54.7  Vital Signs: Temp: 98.1 F (36.7 C) (05/10 2122) Temp Source: Oral (05/10 2122) BP: 108/50 mmHg (05/10 2122) Pulse Rate: 68 (05/10 2122)  Labs:  Recent Labs  04/04/16 0747 04/04/16 0903 04/05/16 0500 04/05/16 0506 04/06/16 0358  HGB 8.4*  --   --  7.1* 7.1*  HCT 26.7*  --   --  22.2* 22.1*  PLT 180  --   --  168 195  LABPROT 19.2*  --   --  18.1* 18.3*  INR 1.61*  --   --  1.49 1.51*  HEPARINUNFRC 0.56  --  0.56  --  0.17*  CREATININE  --  4.26* 3.59*  --   --     Estimated Creatinine Clearance: 18.4 mL/min (by C-G formula based on Cr of 3.59).  Assessment: Heparin level is sub-therapeutic this AM, no issues per RN  Goal of Therapy:  Heparin level 0.3-0.7 units/ml Monitor platelets by anticoagulation protocol: Yes   Plan:  -Increase heparin to 1900 units/hr -1330 HL  Brandy Dudley 04/06/2016,5:29 AM

## 2016-04-06 NOTE — Progress Notes (Signed)
Nutrition Follow-up  DOCUMENTATION CODES:   Morbid obesity  INTERVENTION:  -RD to continue to monitor -Encourage good PO intake  NUTRITION DIAGNOSIS:   Increased nutrient needs related to  (hemodialysis) as evidenced by estimated needs. -ongoing  GOAL:   Patient will meet greater than or equal to 90% of their needs -progressing  MONITOR:   PO intake, Supplement acceptance, Labs, Weight trends, Skin, I & O's  REASON FOR ASSESSMENT:   Low Braden    ASSESSMENT:   65 year old female with a past medical history significant for CHF (EF 35-40%), CKD Stage III, history of RIJ DVT on warfarin, HTN, HLD, COPD who was admitted on 03/20/16 for C. Diff colitis and acute respiratory distress/AoCHF. Transferred to the ICU on 4/26 for CRRT.  Brandy Dudley continues to have good PO intake -> she had scrambled eggs, grits, fruit for breakfast that she states she ate most of.  PO intake in chart 70-80% today. To receive HD today.  INR is still elevated above 1.3 for perm cath placement and AV fistula, receiving HD TTS via left femoral TDC.  Currently C Diff + Weight continues to fluctuate between 239-246#  Labs and Medications reviewed.  Diet Order:  Diet renal/carb modified with fluid restriction Diet-HS Snack?: Nothing; Room service appropriate?: Yes; Fluid consistency:: Thin  Skin:  Reviewed, no issues  Last BM:  03/31/16  Height:   Ht Readings from Last 1 Encounters:  03/26/16 5\' 4"  (1.626 m)    Weight:   Wt Readings from Last 1 Encounters:  04/06/16 239 lb 10.2 oz (108.7 kg)    Ideal Body Weight:  54.5 kg  BMI:  Body mass index is 41.11 kg/(m^2).  Estimated Nutritional Needs:   Kcal:  1900-2100  Protein:  100-115 grams  Fluid:  >/= 1.2 L/day  EDUCATION NEEDS:   No education needs identified at this time  Brandy Ano. Raahim Shartzer, MS, RD LDN After Hours/Weekend Pager [phone]

## 2016-04-06 NOTE — Progress Notes (Addendum)
PROGRESS NOTE    Bowen Bonton  XHB:[id number] DOB: February 19, 1951 DOA: 03/20/2016  PCP: case manager consulted for assistance with setting pt up with PCP   Brief Narrative:  65 y.o. admitted from [facility] to IM service per notes for further evaluation for C. Diff colitis, Acute CHF exacerbation, and ARF on CKD stage III. HF team consulted to assist with fluid management.  Found to need HD  And awaiting INR of < 1.3 for perm cath and AV fistula  Assessment & Plan:   C difficile colitis - on clindamycin recently for MRSA breast abscess - s/p po vancomycin until  5/8 - no diarrhea reported  Acute on chronic systolic CHF: Ischemic cardiomyopathy with history of prior CAD  - Echo 03/21/16 35-40% with Grade 2 DD, mod dilated RV, mild LAE, mod RAE, Severe TR, Peak PA 38 mm Hg. - Continue coreg 3.125 mg twice a day. Hold SBP <85. - appreciate HF team  AKI on CKD, now ESRD - HD TTS via left femoral TCD - set up HD at [facility] TTS, 2nd shift   R IJ DVT in 3/17 - resumed Coumadin   CAD - On ASA + Plavix in the past. - held due to hematuria on last admission (foley trauma) - continue statin      Anemia of chronic disease, ? GI bleed, Thrombocytopenia  - Ferritin 181, Fe 31 - aranesp 100 Q Tuesday with HD - plan to transfuse one unit PRBC with HD today  - CBC In AM   Immobility - SNF eventually (from [facility]), likely early next week   Macerated skin - WOC consult: Foam dressing to protect and promote healing to right lower breast.  - Aquacel to right groin to absorb drainage and provide antimicrobilal benefits.  - Barrier cream to inner gluteal fold and buttocks to protect and repel moisture.   DM type II with complications of nephropathy  - on SSI for now  Morbid Obesity  - Body mass index is 39.71 kg/(m^2).  DVT prophylaxis: restarted coumadin 5/9  Code Status: Full Code  Family Communication: No family at bedside  Disposition Plan: From [facility],  likely go back on Monday as she can not start HD over the weekend. Will ask CM to set up with PCP where pt can follow up and have PT/INR checked   Consultants:   CHF team  PCCM  Nephrology   Procedures:   None  Subjective: Reports feeling better.   Objective: Filed Vitals:   04/06/16 0544 04/06/16 0606 04/06/16 0846 04/06/16 0931  BP:  120/62 119/66   Pulse:  68 74 86  Temp:  98.1 F (36.7 C) 98.5 F (36.9 C)   TempSrc:  Oral Oral   Resp:  18 17   Height:      Weight: 108.7 kg (239 lb 10.2 oz)     SpO2:  100% 98% 93%    Intake/Output Summary (Last 24 hours) at 04/06/16 1454 Last data filed at 04/06/16 1451  Gross per 24 hour  Intake 1152.14 ml  Output      0 ml  Net 1152.14 ml   Filed Weights   04/04/16 1121 04/04/16 2145 04/06/16 0544  Weight: 106.3 kg (234 lb 5.6 oz) 105 kg (231 lb 7.7 oz) 108.7 kg (239 lb 10.2 oz)   Examination:  General exam: Appears calm and comfortable  Respiratory system: Clear to auscultation. Respiratory effort normal. Cardiovascular system: S1 & S2 heard, RRR. +edema Gastrointestinal system: Abdomen is  nondistended, soft and nontender.  Central nervous system: Alert and oriented. No focal neurological deficits. Extremities: new left fem TDC with dressing in place, LUE BC AVF with faint bruit and no palpable thrill, trace B LE edema   Data Reviewed: I have personally reviewed following labs and imaging studies  CBC:  Recent Labs Lab 04/02/16 0444 04/03/16 0434 04/04/16 0747 04/05/16 0506 04/06/16 0358  WBC 7.9 7.5 7.7 7.4 6.7  HGB 7.4* 7.8* 8.4* 7.1* 7.1*  HCT 23.2* 24.1* 26.7* 22.2* 22.1*  MCV 88.9 88.9 90.8 90.6 89.5  PLT 149* 169 180 168 195   Basic Metabolic Panel:  Recent Labs Lab 03/31/16 1945 04/02/16 0438 04/03/16 0434 04/03/16 1423 04/04/16 0903 04/05/16 0500 04/06/16 0350  NA 137 136 135  --  138 136 135  K 3.2* 3.8 4.2  --  4.4 3.7 3.9  CL 99* 99* 99*  --  101 97* 97*  CO2 --  GLUCOSE 161* 113* 150*  --  141* 145* 134*  BUN 15 27* 36*  --  41* 23* 28*  CREATININE 1.62* 2.94* 3.59*  --  4.26* 3.59* 4.45*  CALCIUM 8.2* 8.6* 8.8*  --  9.0 8.6* 8.9  MG  --   --   --  2.1  --   --   --   PHOS 2.2* 3.6  --   --  5.4* 4.1 4.6    Liver Function Tests:  Recent Labs Lab 03/31/16 1945 04/02/16 0438 04/04/16 0903 04/05/16 0500 04/06/16 0350  ALBUMIN 2.6* 2.6* 2.6* 2.5* 2.5*   Coagulation Profile:  Recent Labs Lab 04/02/16 0444 04/03/16 0434 04/04/16 0747 04/05/16 0506 04/06/16 0358  INR 1.42 1.41 1.61* 1.49 1.51*   CBG:  Recent Labs Lab 04/05/16 1124 04/05/16 1614 04/05/16 2112 04/06/16 0844 04/06/16 1143  GLUCAP 149* 135* 124* 123* 126*   Anemia Panel:  Recent Labs  04/04/16 0748  FERRITIN 181  TIBC 328  IRON 31   Urine analysis:    Component Value Date/Time   COLORURINE AMBER* 03/14/2016 1655   APPEARANCEUR CLOUDY* 03/14/2016 1655   LABSPEC 1.019 03/14/2016 1655   PHURINE 5.0 03/14/2016 1655   GLUCOSEU NEGATIVE 03/14/2016 1655   HGBUR LARGE* 03/14/2016 1655   BILIRUBINUR SMALL* 03/14/2016 1655   KETONESUR 15* 03/14/2016 1655   PROTEINUR 30* 03/14/2016 1655   NITRITE NEGATIVE 03/14/2016 1655   LEUKOCYTESUR TRACE* 03/14/2016 1655    Anti-infectives    Start     Dose/Rate Route Frequency Ordered Stop   04/03/16 0000  ceFAZolin (ANCEF) IVPB 1 g/50 mL premix  Status:  Discontinued    Comments:  Send with pt to OR   1 g 100 mL/hr over 30 Minutes Intravenous On call 04/02/16 0724 04/02/16 1350   04/03/16 0000  ceFAZolin (ANCEF) IVPB 2g/100 mL premix    Comments:  Send with pt to OR   2 g 200 mL/hr over 30 Minutes Intravenous On call 04/02/16 1350 04/03/16 1200   03/29/16 1127  cefUROXime (ZINACEF) 1.5 g in dextrose 5 % 50 mL IVPB     1.5 g 100 mL/hr over 30 Minutes Intravenous To ShortStay [facility] 03/28/16 2305 03/30/16 1130   03/23/16 1400  vancomycin (VANCOCIN) 50 mg/mL oral solution 250 mg     250 mg Oral 4 times daily  03/23/16 1051 04/03/16 1759   03/20/16 1800  vancomycin (VANCOCIN) 50 mg/mL oral solution 125 mg  Status:  Discontinued  125 mg Oral 4 times daily 03/20/16 1637 03/23/16 1051   03/20/16 1230  metroNIDAZOLE (FLAGYL) IVPB 500 mg  Status:  Discontinued     500 mg 100 mL/hr over 60 Minutes Intravenous Every 8 hours 03/20/16 1218 03/20/16 1538     Radiology Studies: No results found.  Scheduled Meds: . sodium chloride   Intravenous Once  . antiseptic oral rinse  7 mL Mouth Rinse BID  . atorvastatin  10 mg Oral Daily  . budesonide (PULMICORT) nebulizer solution  0.5 mg Nebulization BID  . carvedilol  3.125 mg Oral BID WC  . darbepoetin (ARANESP) injection - DIALYSIS  100 mcg Intravenous Q Tue-HD  . famotidine  20 mg Oral Daily  . ferric gluconate (FERRLECIT/NULECIT) IV  125 mg Intravenous Q T,Th,Sa-HD  . insulin aspart  0-20 Units Subcutaneous TID WC  . insulin aspart  0-5 Units Subcutaneous QHS  . insulin aspart  4 Units Subcutaneous TID WC  . multivitamin  1 tablet Oral QHS  . Warfarin - Pharmacist Dosing Inpatient   Does not apply q1800   Continuous Infusions: . sodium chloride 10 mL/hr at 04/03/16 0905  . heparin 1,900 Units/hr (04/06/16 0543)     LOS: 17 days   Time spent: 25 min  Debbora Presto, MD [facility] Pager [phone]  If 7PM-7AM, please contact night-coverage [email or web address] Password [email or web address] 04/06/2016, 2:54 PM

## 2016-04-07 LAB — GLUCOSE, CAPILLARY
GLUCOSE-CAPILLARY: 106 mg/dL — AB (ref 65–99)
GLUCOSE-CAPILLARY: 105 mg/dL — AB (ref 65–99)
GLUCOSE-CAPILLARY: 130 mg/dL — AB (ref 65–99)
GLUCOSE-CAPILLARY: 124 mg/dL — AB (ref 65–99)
Glucose-Capillary: 106 mg/dL — ABNORMAL HIGH (ref 65–99)

## 2016-04-07 LAB — CBC
HEMATOCRIT: 25.9 % — AB (ref 36.0–46.0)
HEMOGLOBIN: 8.4 g/dL — AB (ref 12.0–15.0)
MCH: 28.9 pg (ref 26.0–34.0)
MCHC: 32.4 g/dL (ref 30.0–36.0)
MCV: 89 fL (ref 78.0–100.0)
Platelets: 215 10*3/uL (ref 150–400)
RBC: 2.91 MIL/uL — ABNORMAL LOW (ref 3.87–5.11)
RDW: 18.6 % — AB (ref 11.5–15.5)
WBC: 7.5 10*3/uL (ref 4.0–10.5)

## 2016-04-07 LAB — RENAL FUNCTION PANEL
ALBUMIN: 2.6 g/dL — AB (ref 3.5–5.0)
ANION GAP: 15 (ref 5–15)
BUN: 18 mg/dL (ref 6–20)
CO2: 21 mmol/L — AB (ref 22–32)
Calcium: 8.7 mg/dL — ABNORMAL LOW (ref 8.9–10.3)
Chloride: 100 mmol/L — ABNORMAL LOW (ref 101–111)
Creatinine, Ser: 3.54 mg/dL — ABNORMAL HIGH (ref 0.44–1.00)
GFR calc Af Amer: 15 mL/min — ABNORMAL LOW (ref >60)
GFR calc non Af Amer: 13 mL/min — ABNORMAL LOW (ref >60)
GLUCOSE: 100 mg/dL — AB (ref 65–99)
POTASSIUM: 4.4 mmol/L (ref 3.5–5.1)
Phosphorus: 3.2 mg/dL (ref 2.5–4.6)
SODIUM: 136 mmol/L (ref 135–145)

## 2016-04-07 LAB — TYPE AND SCREEN
ABO/RH(D): A POS
ANTIBODY SCREEN: NEGATIVE
Unit division: 0

## 2016-04-07 LAB — PROTIME-INR
INR: 1.53 — AB (ref 0.00–1.49)
PROTHROMBIN TIME: 18.5 s — AB (ref 11.6–15.2)

## 2016-04-07 LAB — HEPARIN LEVEL (UNFRACTIONATED): Heparin Unfractionated: 0.3 IU/mL (ref 0.30–0.70)

## 2016-04-07 MED ORDER — WARFARIN SODIUM 5 MG PO TABS
5.0000 mg | ORAL_TABLET | Freq: Once | ORAL | Status: AC
  Administered 2016-04-07: 5 mg via ORAL
  Filled 2016-04-07: qty 1

## 2016-04-07 NOTE — Clinical Social Work Note (Signed)
Clinical Social Work Assessment  Patient Details  Name: Brandy Dudley MRN: [id number] Date of Birth: 08-05-1951  Date of referral:  03/21/16               Reason for consult:  Discharge Planning                Permission sought to share information with:  Oceanographer granted to share information::  Yes, Verbal Permission Granted  Name::     [name]- Patient's sister  Agency::  [facility]  Relationship::  [name]- patient's sister  Contact Information:     Housing/Transportation Living arrangements for the past 2 months:  Skilled Nursing Facility Geologist, engineering for rehab- 2 weeks) Source of Information:  Patient Patient Interpreter Needed:  None Criminal Activity/Legal Involvement Pertinent to Current Situation/Hospitalization:  No - Comment as needed Significant Relationships:  Siblings Lives with:  Siblings Do you feel safe going back to the place where you live?  Yes Need for family participation in patient care:  Yes (Comment)  Care giving concerns:  Patient expressed no concerns at this time.   Social Worker assessment / plan:  LCSW received referral stating patient admitted from [facility]. LCSW spoke with patient regarding discharge planning. Per patient, patient would prefer to return to [facility] once medically stable for discharge. LCSW updated [facility] admissions liaison regarding patient's request. Per [facility] admissions liaison, patient able to return to [facility] once medically stable. LCSW to continue to follow and assist with discharge planning needs.  Employment status:  Retired Health and safety inspector:  Medicare PT Recommendations:  Not assessed at this time Information / Referral to community resources:  Skilled Nursing Facility  Patient/Family's Response to care:  Patient understanding and agreeable to [employer] plan of care.  Patient/Family's Understanding of and Emotional Response to Diagnosis, Current Treatment, and Prognosis:  Patient  understanding and agreeable to LCSW plan of care.  Emotional Assessment Appearance:  Appears stated age Attitude/Demeanor/Rapport:   (Patient was welcoming of CSW and appropriate. ) Affect (typically observed):  Accepting, Calm, Appropriate Orientation:  Oriented to Self Alcohol / Substance use:  Not Applicable Psych involvement (Current and /or in the community):  No (Comment)  Discharge Needs  Concerns to be addressed:  Discharge Planning Concerns Readmission within the last 30 days:  No Current discharge risk:  Physical Impairment Barriers to Discharge:  Continued Medical Work up   Rod [name], LCSW 04/07/2016, 11:43 AM

## 2016-04-07 NOTE — Progress Notes (Signed)
ANTICOAGULATION CONSULT NOTE - Follow Up Consult  Pharmacy Consult for Heparin and coumadin Indication: Recent DVT (March '17)  Allergies  Allergen Reactions  . Entresto [Sacubitril-Valsartan] Other (See Comments)    On MAR    Patient Measurements: Height: 5\' 4"  (162.6 cm) Weight: 234 lb 12.6 oz (106.5 kg) IBW/kg (Calculated) : 54.7 Heparin Dosing Weight: 80 kg  Vital Signs: Temp: 99 F (37.2 C) (05/12 0857) Temp Source: Oral (05/12 0857) BP: 138/67 mmHg (05/12 0857) Pulse Rate: 81 (05/12 0912)  Labs:  Recent Labs  04/05/16 0500  04/05/16 0506 04/06/16 0350 04/06/16 0358 04/06/16 1936 04/07/16 1107 04/07/16 1128  HGB  --   < > 7.1*  --  7.1*  --  8.4*  --   HCT  --   --  22.2*  --  22.1*  --  25.9*  --   PLT  --   --  168  --  195  --  215  --   LABPROT  --   --  18.1*  --  18.3*  --  18.5*  --   INR  --   --  1.49  --  1.51*  --  1.53*  --   HEPARINUNFRC 0.56  --   --   --  0.17* 0.25* 0.30  --   CREATININE 3.59*  --   --  4.45*  --   --   --  3.54*  < > = values in this interval not displayed.  Estimated Creatinine Clearance: 18.9 mL/min (by C-G formula based on Cr of 3.54).  Assessment: 65 yo F on warfarin pta for recent RIJ DVT. INR on admit supratherapeutic - warfarin was held and reversed for central line and temp HD cath placement. Warfarin was then on hold for permanent HD cath placement. Now S/P L. femoral tunneled HD cath, and L. upper arm brachiocephalic AV fistula creation on 5/8. Pharmacy restarted warfarin 5/9. HL is therapeutic today at 0.3 on 2000 units/hr.  INR 1.53 after 3 doses of coumadin 5 mg.  Hg 7.1>8.4 after 1 U PRBC 5/11. PLTC WNL,  No bleeding reported.  Goal of Therapy:  Heparin level 0.3-0.7 units/ml Monitor platelets by anticoagulation protocol: Yes  INR 2-3   Plan:  Continue heparin  2000 units/hr (20 ml/hr) Coumadin 5 mg po x 1 dose Daily HL/CBC/INR Coumadin education done 4/30  Herby Abraham, Pharm.D. [phone] 04/07/2016  1:32 PM

## 2016-04-07 NOTE — NC FL2 (Signed)
[employer] MEDICAID FL2 LEVEL OF CARE SCREENING TOOL     IDENTIFICATION  Patient Name: Rosela Supak Birthdate: 1951-05-01 Sex: female Admission Date (Current Location): 03/20/2016  [facility] and [place] Number:  Producer, television/film/video and Address:  [facility], [address], [place], [place] [address]      Provider Number: [id number]  Attending Physician Name and Address:  Dorothea Ogle, MD  Relative Name and Phone Number:       Current Level of Care: Hospital Recommended Level of Care: Skilled Nursing Facility Prior Approval Number:    Date Approved/Denied:   PASRR Number: [id number]  Discharge Plan: SNF    Current Diagnoses: Patient Active Problem List   Diagnosis Date Noted  . Pressure ulcer 04/02/2016  . ESRD on dialysis (HCC)   . Anasarca   . Bloody diarrhea   . AKI (acute kidney injury) (HCC)   . Respiratory failure (HCC)   . Acute on chronic congestive heart failure (HCC)   . Chronic kidney disease (CKD), stage IV (severe) (HCC)   . C. difficile colitis 03/20/2016  . COPD (chronic obstructive pulmonary disease) (HCC) 03/20/2016  . Chest wall abscess 03/20/2016  . Obstructive sleep apnea 03/20/2016  . AICD (automatic cardioverter/defibrillator) present 03/20/2016  . C. difficile diarrhea 03/20/2016  . Physical deconditioning 03/02/2016  . DVT (deep venous thrombosis), right 03/02/2016  . Chronic systolic congestive heart failure (HCC) 03/02/2016  . Cardiomyopathy, ischemic 03/02/2016  . Coronary artery disease involving native coronary artery of native heart without angina pectoris 03/02/2016  . Essential hypertension, malignant 03/02/2016  . Diabetes mellitus type 2 in obese (HCC) 03/02/2016    Orientation RESPIRATION BLADDER Height & Weight     Self, Time, Situation, Place  O2 Continent Weight: 234 lb 12.6 oz (106.5 kg) Height:   (162.6 cm)  BEHAVIORAL SYMPTOMS/MOOD NEUROLOGICAL BOWEL NUTRITION STATUS      Continent Diet  (Please see discharge summary.)  AMBULATORY STATUS COMMUNICATION OF NEEDS Skin   Limited Assist Verbally PU Stage and Appropriate Care   PU Stage 2 Dressing: No Dressing                   Personal Care Assistance Level of Assistance  Bathing, Feeding, Dressing Bathing Assistance: Limited assistance Feeding assistance: Limited assistance Dressing Assistance: Limited assistance     Functional Limitations Info             SPECIAL CARE FACTORS FREQUENCY  PT (By licensed PT), OT (By licensed OT) (New dialysis. [facility] in [place], T-Th-Sat on 2nd shift)     PT Frequency: 5 OT Frequency: 5            Contractures      Additional Factors Info  Code Status, Allergies, Isolation Precautions Code Status Info: FULL Allergies Info: Entresto     Isolation Precautions Info: Enteric     Current Medications (04/07/2016):  This is the current hospital active medication list Current Facility-Administered Medications  Medication Dose Route Frequency Provider Last Rate Last Dose  . 0.9 %  sodium chloride infusion   Intravenous Once Fransisco Hertz, MD      . 0.9 %  sodium chloride infusion   Intravenous Continuous Gaynelle Adu, MD 10 mL/hr at 04/03/16 [phone]    . 0.9 %  sodium chloride infusion   Intravenous Once Camille Bal, MD      . acetaminophen (TYLENOL) tablet 650 mg  650 mg Oral Q6H PRN Marcos Eke, PA-C  650 mg at 04/05/16 2116   Or  . acetaminophen (TYLENOL) suppository 650 mg  650 mg Rectal Q6H PRN Marcos Eke, PA-C      . albuterol (PROVENTIL) (2.5 MG/3ML) 0.083% nebulizer solution 2.5 mg  2.5 mg Nebulization Q6H PRN Nelda Bucks, MD      . antiseptic oral rinse (CPC / CETYLPYRIDINIUM CHLORIDE 0.05%) solution 7 mL  7 mL Mouth Rinse BID Costin Otelia Sergeant, MD   7 mL at 04/07/16 0050  . atorvastatin (LIPITOR) tablet 10 mg  10 mg Oral Daily Marcos Eke, PA-C   10 mg at 04/07/16 1051  . budesonide (PULMICORT) nebulizer solution 0.5 mg  0.5 mg  Nebulization BID Jeanella Craze, NP   0.5 mg at 04/07/16 0910  . carvedilol (COREG) tablet 3.125 mg  3.125 mg Oral BID WC Mariam Dollar Tillery, PA-C   3.125 mg at 04/07/16 0854  . Darbepoetin Alfa (ARANESP) injection 100 mcg  100 mcg Intravenous Q Tue-HD Camille Bal, MD   100 mcg at 04/04/16 1144  . diphenhydrAMINE (BENADRYL) 12.5 MG/5ML elixir 12.5 mg  12.5 mg Oral Q6H PRN Praveen Mannam, MD   12.5 mg at 04/04/16 0817  . famotidine (PEPCID) tablet 20 mg  20 mg Oral Daily Sherron Monday, RPH   20 mg at 04/07/16 1051  . ferric gluconate (NULECIT) 125 mg in sodium chloride 0.9 % 100 mL IVPB  125 mg Intravenous Q T,Th,Sa-HD Camille Bal, MD   125 mg at 04/06/16 1200  . heparin ADULT infusion 100 units/mL (25000 units/250 mL)  2,000 Units/hr Intravenous Continuous Ann Held, RPH 20 mL/hr at 04/06/16 2050 2,000 Units/hr at 04/06/16 2050  . hydrocortisone cream 1 %   Topical Daily PRN Laurey Morale, MD      . insulin aspart (novoLOG) injection 0-20 Units  0-20 Units Subcutaneous TID WC Jeanella Craze, NP   3 Units at 04/06/16 1734  . insulin aspart (novoLOG) injection 0-5 Units  0-5 Units Subcutaneous QHS Jeanella Craze, NP   3 Units at 03/29/16 2131  . insulin aspart (novoLOG) injection 4 Units  4 Units Subcutaneous TID WC Joseph Art, DO   4 Units at 04/07/16 0857  . LORazepam (ATIVAN) tablet 0.5 mg  0.5 mg Oral BID PRN Rolly Salter, MD   0.5 mg at 04/07/16 0854  . multivitamin (RENA-VIT) tablet 1 tablet  1 tablet Oral QHS Lauris Poag, MD   1 tablet at 04/07/16 0049  . ondansetron (ZOFRAN) tablet 4 mg  4 mg Oral Q6H PRN Marcos Eke, PA-C       Or  . ondansetron [facility]) injection 4 mg  4 mg Intravenous Q6H PRN Marcos Eke, PA-C      . phenol (CHLORASEPTIC) mouth spray 1 spray  1 spray Mouth/Throat PRN Joseph Art, DO      . Warfarin - Pharmacist Dosing Inpatient   Does not apply q1800 Joseph Art, DO         Discharge Medications: Please see discharge summary  for a list of discharge medications.  Relevant Imaging Results:  Relevant Lab Results:   Additional Information SSN: [id number]  Rod [name], LCSW

## 2016-04-07 NOTE — Progress Notes (Signed)
PROGRESS NOTE    Brandy Dudley  ZOX:[id number] DOB: 04-14-1951 DOA: 03/20/2016  PCP: case manager consulted for assistance with setting pt up with PCP   Brief Narrative:  65 y.o. admitted from [facility] to IM service per notes for further evaluation for C. Diff colitis, Acute CHF exacerbation, and ARF on CKD stage III. HF team consulted to assist with fluid management.  Found to need HD  And awaiting INR of < 1.3 for perm cath and AV fistula  Assessment & Plan:   C difficile colitis - on clindamycin recently for MRSA breast abscess - s/p po vancomycin until  5/8 - no diarrhea reported since   Acute on chronic systolic CHF: Ischemic cardiomyopathy with history of prior CAD  - Echo 03/21/16 35-40% with Grade 2 DD, mod dilated RV, mild LAE, mod RAE, Severe TR, Peak PA 38 mm Hg. - Continue coreg 3.125 mg twice a day. - appreciate HF team  AKI on CKD, now ESRD - HD TTS via left femoral TCD - set up HD at [facility] T[facility], 2nd shift   R IJ DVT in 3/17 - resumed Coumadin, INR 1.53 this AM  CAD - On ASA + Plavix in the past. - held due to hematuria on last admission (foley trauma) - continue statin      Anemia of chronic disease, ? GI bleed, Thrombocytopenia  - Ferritin 181, Fe 31 - aranesp 100 Q Tuesday with HD - Hg up from 7.1 --> 8.4 post transfusion of one unit of PRBC - CBC in AM   Immobility - SNF eventually (from [facility]), likely early next week   Macerated skin - WOC consult: Foam dressing to protect and promote healing to right lower breast.  - Aquacel to right groin to absorb drainage and provide antimicrobilal benefits.  - Barrier cream to inner gluteal fold and buttocks to protect and repel moisture.   DM type II with complications of nephropathy  - on SSI for now  Morbid Obesity  - Body mass index is 39.71 kg/(m^2).  DVT prophylaxis: restarted coumadin 5/9  Code Status: Full Code  Family Communication: No family at bedside  Disposition Plan:  From [facility], likely go back on Monday as she can not start HD over the weekend. Will ask CM to set up with PCP where pt can follow up and have PT/INR checked   Consultants:   CHF team  PCCM  Nephrology   Procedures:   None  Subjective: Reports feeling better.   Objective: Filed Vitals:   04/07/16 0340 04/07/16 0857 04/07/16 0912 04/07/16 1801  BP: 117/67 138/67  100/61  Pulse: 78 77 81 71  Temp: 97.6 F (36.4 C) 99 F (37.2 C)  98.8 F (37.1 C)  TempSrc: Oral Oral  Oral  Resp: 18 17 18 18   Height:      Weight:      SpO2: 99% 97% 98% 97%    Intake/Output Summary (Last 24 hours) at 04/07/16 1837 Last data filed at 04/07/16 1051  Gross per 24 hour  Intake 1043.78 ml  Output   2301 ml  Net -1257.22 ml   Filed Weights   04/06/16 0544 04/06/16 1914 04/06/16 2322  Weight: 108.7 kg (239 lb 10.2 oz) 109 kg (240 lb 4.8 oz) 106.5 kg (234 lb 12.6 oz)   Examination:  General exam: Appears calm and comfortable  Respiratory system: Clear to auscultation. Respiratory effort normal. Cardiovascular system: S1 & S2 heard, RRR. +edema Gastrointestinal system: Abdomen is  nondistended, soft and nontender.  Central nervous system: Alert and oriented. No focal neurological deficits. Extremities: new left fem TDC with dressing in place, LUE BC AVF with faint bruit and no palpable thrill, trace B LE edema   Data Reviewed: I have personally reviewed following labs and imaging studies  CBC:  Recent Labs Lab 04/03/16 0434 04/04/16 0747 04/05/16 0506 04/06/16 0358 04/07/16 1107  WBC 7.5 7.7 7.4 6.7 7.5  HGB 7.8* 8.4* 7.1* 7.1* 8.4*  HCT 24.1* 26.7* 22.2* 22.1* 25.9*  MCV 88.9 90.8 90.6 89.5 89.0  PLT 169 180 168 195 215   Basic Metabolic Panel:  Recent Labs Lab 04/02/16 0438 04/03/16 0434 04/03/16 1423 04/04/16 0903 04/05/16 0500 04/06/16 0350 04/07/16 1128  NA 136 135  --  138 136 135 136  K 3.8 4.2  --  4.4 3.7 3.9 4.4  CL 99* 99*  --  101 97* 97* 100*    CO2 26 24  --  25 28 25  21*  GLUCOSE 113* 150*  --  141* 145* 134* 100*  BUN 27* 36*  --  41* 23* 28* 18  CREATININE 2.94* 3.59*  --  4.26* 3.59* 4.45* 3.54*  CALCIUM 8.6* 8.8*  --  9.0 8.6* 8.9 8.7*  MG  --   --  2.1  --   --   --   --   PHOS 3.6  --   --  5.4* 4.1 4.6 3.2    Liver Function Tests:  Recent Labs Lab 04/02/16 0438 04/04/16 0903 04/05/16 0500 04/06/16 0350 04/07/16 1128  ALBUMIN 2.6* 2.6* 2.5* 2.5* 2.6*   Coagulation Profile:  Recent Labs Lab 04/03/16 0434 04/04/16 0747 04/05/16 0506 04/06/16 0358 04/07/16 1107  INR 1.41 1.61* 1.49 1.51* 1.53*   CBG:  Recent Labs Lab 04/06/16 1705 04/07/16 0037 04/07/16 0853 04/07/16 1157 04/07/16 1759  GLUCAP 135* 106* 106* 105* 130*   Anemia Panel: No results for input(s): VITAMINB12, FOLATE, FERRITIN, TIBC, IRON, RETICCTPCT in the last 72 hours. Urine analysis:    Component Value Date/Time   COLORURINE AMBER* 03/14/2016 1655   APPEARANCEUR CLOUDY* 03/14/2016 1655   LABSPEC 1.019 03/14/2016 1655   PHURINE 5.0 03/14/2016 1655   GLUCOSEU NEGATIVE 03/14/2016 1655   HGBUR LARGE* 03/14/2016 1655   BILIRUBINUR SMALL* 03/14/2016 1655   KETONESUR 15* 03/14/2016 1655   PROTEINUR 30* 03/14/2016 1655   NITRITE NEGATIVE 03/14/2016 1655   LEUKOCYTESUR TRACE* 03/14/2016 1655    Anti-infectives    Start     Dose/Rate Route Frequency Ordered Stop   04/03/16 0000  ceFAZolin (ANCEF) IVPB 1 g/50 mL premix  Status:  Discontinued    Comments:  Send with pt to OR   1 g 100 mL/hr over 30 Minutes Intravenous On call 04/02/16 0724 04/02/16 1350   04/03/16 0000  ceFAZolin (ANCEF) IVPB 2g/100 mL premix    Comments:  Send with pt to OR   2 g 200 mL/hr over 30 Minutes Intravenous On call 04/02/16 1350 04/03/16 1200   03/29/16 1127  cefUROXime (ZINACEF) 1.5 g in dextrose 5 % 50 mL IVPB     1.5 g 100 mL/hr over 30 Minutes Intravenous To ShortStay Surgical 03/28/16 2305 03/30/16 1130   03/23/16 1400  vancomycin (VANCOCIN) 50  mg/mL oral solution 250 mg     250 mg Oral 4 times daily 03/23/16 1051 04/03/16 1759   03/20/16 1800  vancomycin (VANCOCIN) 50 mg/mL oral solution 125 mg  Status:  Discontinued     125 mg  Oral 4 times daily 03/20/16 1637 03/23/16 1051   03/20/16 1230  metroNIDAZOLE (FLAGYL) IVPB 500 mg  Status:  Discontinued     500 mg 100 mL/hr over 60 Minutes Intravenous Every 8 hours 03/20/16 1218 03/20/16 1538     Radiology Studies: No results found.  Scheduled Meds: . sodium chloride   Intravenous Once  . sodium chloride   Intravenous Once  . antiseptic oral rinse  7 mL Mouth Rinse BID  . atorvastatin  10 mg Oral Daily  . budesonide (PULMICORT) nebulizer solution  0.5 mg Nebulization BID  . carvedilol  3.125 mg Oral BID WC  . darbepoetin (ARANESP) injection - DIALYSIS  100 mcg Intravenous Q Tue-HD  . famotidine  20 mg Oral Daily  . ferric gluconate (FERRLECIT/NULECIT) IV  125 mg Intravenous Q T,Th,Sa-HD  . insulin aspart  0-20 Units Subcutaneous TID WC  . insulin aspart  0-5 Units Subcutaneous QHS  . insulin aspart  4 Units Subcutaneous TID WC  . multivitamin  1 tablet Oral QHS  . Warfarin - Pharmacist Dosing Inpatient   Does not apply q1800   Continuous Infusions: . sodium chloride 10 mL/hr at 04/03/16 0905  . heparin 2,000 Units/hr (04/06/16 2050)     LOS: 18 days   Time spent: 25 min  Debbora Presto, MD [facility] Pager [phone]  If 7PM-7AM, please contact night-coverage [email or web address] Password [facility] 04/07/2016, 6:37 PM

## 2016-04-07 NOTE — Progress Notes (Signed)
[facility] Rounding Note  Subjective/Interim Events:  Doing well this morning. Tolerated HD yesterday without complication. Received 1u pRBC yesterday.   Objective: Vital signs in last 24 hours: Temp:  [97.5 F (36.4 C)-99 F (37.2 C)] 99 F (37.2 C) (05/12 0857) Pulse Rate:  [69-86] 77 (05/12 0857) Resp:  [17-25] 17 (05/12 0857) BP: (97-138)/(56-72) 138/67 mmHg (05/12 0857) SpO2:  [93 %-100 %] 97 % (05/12 0857) Weight:  [234 lb 12.6 oz (106.5 kg)-240 lb 4.8 oz (109 kg)] 234 lb 12.6 oz (106.5 kg) (05/11 2322) Weight change: 10.6 oz (0.3 kg)  Filed Weights   04/06/16 0544 04/06/16 1914 04/06/16 2322  Weight: 239 lb 10.2 oz (108.7 kg) 240 lb 4.8 oz (109 kg) 234 lb 12.6 oz (106.5 kg)    Intake/Output from previous day: 05/11 0701 - 05/12 0700 In: 1523.8 [P.O.:720; I.V.:468.8; Blood:335] Out: 2301  Intake/Output this shift:   Physical Examination BP 138/67 mmHg  Pulse 77  Temp(Src) 99 F (37.2 C) (Oral)  Resp 17  Ht 5\' 4"  (1.626 m)  Wt 234 lb 12.6 oz (106.5 kg)  BMI 40.28 kg/m2  SpO2 97%  General: 65 yo female in NAD, lying in bed CV: RRR PULM: NWOB, CTA Extremities: No Edema in LE  Access: -New left fem TDC with dsg in place  -Left upper arm BC AVF +  bruit (5/8) and sounds stronger today  Lab Results:  Recent Labs  04/05/16 0506 04/06/16 0358  WBC 7.4 6.7  HGB 7.1* 7.1*  HCT 22.2* 22.1*  PLT 168 195   PTH  Date/Time Value Ref Range Status  04/04/2016 09:10 AM 77* 15 - 65 pg/mL Final    Comment:    (NOTE) Performed At: [facility] [address] [place], [place] [id number] Mila Homer MD XM:[id number]    Iron/TIBC/Ferritin/ %Sat    Component Value Date/Time   IRON 31 04/04/2016 0748   TIBC 328 04/04/2016 0748   FERRITIN 181 04/04/2016 0748   IRONPCTSAT 9* 04/04/2016 0748    Recent Labs  04/05/16 0500 04/06/16 0350  NA 136 135  K 3.7 3.9  CL 97* 97*  CO2 28 25  GLUCOSE 145* 134*  BUN 23* 28*  CREATININE 3.59* 4.45*  CALCIUM  8.6* 8.9   PHOSPHORUS  Date Value Ref Range Status  04/06/2016 4.6 2.5 - 4.6 mg/dL Final  73/40/3709 4.1 2.5 - 4.6 mg/dL Final  64/38/3818 5.4* 2.5 - 4.6 mg/dL Final  40/37/5436 3.6 2.5 - 4.6 mg/dL Final  06/77/0340 2.2* 2.5 - 4.6 mg/dL Final    Scheduled Medications: . sodium chloride   Intravenous Once  . sodium chloride   Intravenous Once  . antiseptic oral rinse  7 mL Mouth Rinse BID  . atorvastatin  10 mg Oral Daily  . budesonide (PULMICORT) nebulizer solution  0.5 mg Nebulization BID  . carvedilol  3.125 mg Oral BID WC  . darbepoetin (ARANESP) injection - DIALYSIS  100 mcg Intravenous Q Tue-HD  . famotidine  20 mg Oral Daily  . ferric gluconate (FERRLECIT/NULECIT) IV  125 mg Intravenous Q T,Th,Sa-HD  . insulin aspart  0-20 Units Subcutaneous TID WC  . insulin aspart  0-5 Units Subcutaneous QHS  . insulin aspart  4 Units Subcutaneous TID WC  . multivitamin  1 tablet Oral QHS  . Warfarin - Pharmacist Dosing Inpatient   Does not apply q1800    Background Pt is a 65 y.o. yo female who was admitted on 03/20/2016 with C Diff colitis and acute respiratory  distress/ AoCHF, developed AKI on CKD->new ESRD d/t cardiorenal syndrome. Started on CVVHD and transitioned to IHD on 5/1. Right femoral dialysis catheter removed 5/6. Left femoral tunneled dialysis catheter placed on 5/9. Left upper arm AVF placed 5/8.   Other probs COPD, DM2, AICD in place.   1. 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 HD TTS via left femoral TDC 2. Has outpt HD spot at [facility] TTS 2nd shift (cannot start on Saturday - will get HD here tomorrow) 3. EDW around 105kg 2. Anemia/Thrombocytopenia: Hgb 7-8 during this admission. Ferritin 181, fe 31. S/p 1u pRBC 5/11.  1. Aranesp 100 every Tuesday with HD (started 5/9) 2. 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 Phos good no binders 4. Vascular access - L fem TDC/L BC  AVF (5/8 Dr. Arbie Cookey).  1. On same side as the ICD. May not mature but bruit stronger today, no arm edema.   5. H/o DVT R IJ -  Pharmacy following/ management per primary- at discharge will need coumadin monitoring by primary or coumadin clinic - not at the HD unit 6. GI bleed- Reports blood in stool, management per primary 7. CAD/iCM/CHF- cards managing. 8. DM2: SSI per primary 9. C Diff colitis- on Vanc PO per primary. 10. Decubitus ulcers with another wound in right groin. Wound care has seen. See their note of 5/9  Katina Degree. Jimmey Ralph, MD [facility] Family Medicine Resident PGY-2 04/07/2016 9:05 AM   I have seen and examined this patient and agree with plan and assessment in the above note. Has outpt HD TTS 2nd shift spot at [place] (but cannot start on a Saturday, so anticipate will need to stay through the weekend before can return to SNF). HD here tomorrow. L AVF bruit sounds stronger... Juley Giovanetti B,MD 04/07/2016 1:30 PM

## 2016-04-07 NOTE — Progress Notes (Signed)
PT Cancellation Note  Patient Details Name: Brandy Dudley MRN: [id number] DOB: 07-Jul-1951   Cancelled Treatment:    Reason Eval/Treat Not Completed: Patient at procedure or test/unavailable (Pt was being walked by CNA so could not do therapy afterward).  Try again Monday.   Ivar Drape 04/07/2016, 3:02 PM   Samul Dada, PT MS Acute Rehab Dept. Number: [facility] [id number] and [facility] [phone]

## 2016-04-08 LAB — GLUCOSE, CAPILLARY
GLUCOSE-CAPILLARY: 132 mg/dL — AB (ref 65–99)
GLUCOSE-CAPILLARY: 138 mg/dL — AB (ref 65–99)
Glucose-Capillary: 95 mg/dL (ref 65–99)

## 2016-04-08 LAB — CBC
HCT: 26.1 % — ABNORMAL LOW (ref 36.0–46.0)
Hemoglobin: 8.5 g/dL — ABNORMAL LOW (ref 12.0–15.0)
MCH: 29.1 pg (ref 26.0–34.0)
MCHC: 32.6 g/dL (ref 30.0–36.0)
MCV: 89.4 fL (ref 78.0–100.0)
Platelets: 225 10*3/uL (ref 150–400)
RBC: 2.92 MIL/uL — ABNORMAL LOW (ref 3.87–5.11)
RDW: 18.6 % — AB (ref 11.5–15.5)
WBC: 7.1 10*3/uL (ref 4.0–10.5)

## 2016-04-08 LAB — RENAL FUNCTION PANEL
ANION GAP: 12 (ref 5–15)
Albumin: 2.6 g/dL — ABNORMAL LOW (ref 3.5–5.0)
BUN: 25 mg/dL — AB (ref 6–20)
CHLORIDE: 98 mmol/L — AB (ref 101–111)
CO2: 25 mmol/L (ref 22–32)
Calcium: 9.3 mg/dL (ref 8.9–10.3)
Creatinine, Ser: 4.58 mg/dL — ABNORMAL HIGH (ref 0.44–1.00)
GFR calc Af Amer: 11 mL/min — ABNORMAL LOW (ref >60)
GFR calc non Af Amer: 9 mL/min — ABNORMAL LOW (ref >60)
GLUCOSE: 123 mg/dL — AB (ref 65–99)
PHOSPHORUS: 4 mg/dL (ref 2.5–4.6)
POTASSIUM: 4 mmol/L (ref 3.5–5.1)
Sodium: 135 mmol/L (ref 135–145)

## 2016-04-08 LAB — PROTIME-INR
INR: 1.51 — AB (ref 0.00–1.49)
Prothrombin Time: 18.3 seconds — ABNORMAL HIGH (ref 11.6–15.2)

## 2016-04-08 LAB — HEPARIN LEVEL (UNFRACTIONATED): HEPARIN UNFRACTIONATED: 0.61 [IU]/mL (ref 0.30–0.70)

## 2016-04-08 MED ORDER — SODIUM CHLORIDE 0.9% FLUSH
10.0000 mL | INTRAVENOUS | Status: DC | PRN
Start: 2016-04-08 — End: 2016-04-10

## 2016-04-08 MED ORDER — WARFARIN SODIUM 7.5 MG PO TABS
7.5000 mg | ORAL_TABLET | Freq: Once | ORAL | Status: AC
  Administered 2016-04-08: 7.5 mg via ORAL
  Filled 2016-04-08 (×2): qty 1

## 2016-04-08 NOTE — Progress Notes (Signed)
[facility] Rounding Note  Subjective/Interim Events:  Seen in HD this morning. Sleeping. No complaints.   Objective: Vital signs in last 24 hours: Temp:  [97 F (36.1 C)-98.8 F (37.1 C)] 97 F (36.1 C) (05/13 0715) Pulse Rate:  [71-81] 78 (05/13 0830) Resp:  [16-22] 22 (05/13 0715) BP: (100-125)/(59-90) 107/70 mmHg (05/13 0830) SpO2:  [96 %-99 %] 96 % (05/13 0524) Weight:  [235 lb 10.8 oz (106.9 kg)-243 lb 6.2 oz (110.4 kg)] 235 lb 10.8 oz (106.9 kg) (05/13 0715) Weight change: 3 lb 1.4 oz (1.4 kg)  Filed Weights   04/06/16 2322 04/07/16 2102 04/08/16 0715  Weight: 234 lb 12.6 oz (106.5 kg) 243 lb 6.2 oz (110.4 kg) 235 lb 10.8 oz (106.9 kg)    Intake/Output from previous day: 05/12 0701 - 05/13 0700 In: 720 [P.O.:720] Out: 0  Intake/Output this shift:   Physical Examination BP 107/70 mmHg  Pulse 78  Temp(Src) 97 F (36.1 C) (Oral)  Resp 22  Ht 5\' 4"  (1.626 m)  Wt 235 lb 10.8 oz (106.9 kg)  BMI 40.43 kg/m2  SpO2 96%  General: 65 yo female in NAD, lying in bed, sleeping CV: RRR PULM: NWOB, CTA Extremities: No Edema in LE  Access: -New left fem TDC with dsg in place  -Left upper arm BC AVF +  bruit (5/8)   Lab Results:  Recent Labs  04/07/16 1107 04/08/16 0634  WBC 7.5 7.1  HGB 8.4* 8.5*  HCT 25.9* 26.1*  PLT 215 225   PTH  Date/Time Value Ref Range Status  04/04/2016 09:10 AM 77* 15 - 65 pg/mL Final    Comment:    (NOTE) Performed At: [facility] [address] [place], [place] [id number] Mila Homer MD AY:[id number]    Iron/TIBC/Ferritin/ %Sat    Component Value Date/Time   IRON 31 04/04/2016 0748   TIBC 328 04/04/2016 0748   FERRITIN 181 04/04/2016 0748   IRONPCTSAT 9* 04/04/2016 0748    Recent Labs  04/07/16 1128 04/08/16 0634  NA 136 135  K 4.4 4.0  CL 100* 98*  CO2 21* 25  GLUCOSE 100* 123*  BUN 18 25*  CREATININE 3.54* 4.58*  CALCIUM 8.7* 9.3   PHOSPHORUS  Date Value Ref Range Status  04/08/2016 4.0 2.5 - 4.6  mg/dL Final  23/95/3202 3.2 2.5 - 4.6 mg/dL Final  33/43/5686 4.6 2.5 - 4.6 mg/dL Final  16/83/7290 4.1 2.5 - 4.6 mg/dL Final  21/09/5519 5.4* 2.5 - 4.6 mg/dL Final    Scheduled Medications: . sodium chloride   Intravenous Once  . sodium chloride   Intravenous Once  . antiseptic oral rinse  7 mL Mouth Rinse BID  . atorvastatin  10 mg Oral Daily  . budesonide (PULMICORT) nebulizer solution  0.5 mg Nebulization BID  . carvedilol  3.125 mg Oral BID WC  . darbepoetin (ARANESP) injection - DIALYSIS  100 mcg Intravenous Q Tue-HD  . famotidine  20 mg Oral Daily  . ferric gluconate (FERRLECIT/NULECIT) IV  125 mg Intravenous Q T,Th,Sa-HD  . insulin aspart  0-20 Units Subcutaneous TID WC  . insulin aspart  0-5 Units Subcutaneous QHS  . insulin aspart  4 Units Subcutaneous TID WC  . multivitamin  1 tablet Oral QHS  . warfarin  7.5 mg Oral ONCE-1800  . Warfarin - Pharmacist Dosing Inpatient   Does not apply q1800    Background Pt is a 65 y.o. yo female who was admitted on 03/20/2016 with C Diff colitis and acute  respiratory distress/ AoCHF, developed AKI on CKD->new ESRD d/t cardiorenal syndrome. Started on CVVHD and transitioned to IHD on 5/1. Right femoral dialysis catheter removed 5/6. Left femoral tunneled dialysis catheter placed on 5/9. Left upper arm AVF placed 5/8.   Other probs COPD, DM2, AICD in place.   1. 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 HD TTS via left femoral TDC 2. Has outpt HD spot at [facility] TTS 2nd shift (cannot start on Saturday - will get HD today while admitted) 3. EDW around 105kg 4. LUE AVF placed 5/8 2. Anemia/Thrombocytopenia: Hgb 7-8 during this admission. Ferritin 181, fe 31. S/p 1u pRBC 5/11.  1. Aranesp 100 every Tuesday with HD (started 5/9) 2. 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 Phos good - no binders needed 4. Vascular access - L fem TDC/L BC AVF (5/8  Dr. Arbie Cookey).  1. On same side as the ICD. May not mature but bruit increasing in intensity. No arm edema.  5. H/o DVT R IJ -  Pharmacy following/ management per primary- at discharge will need coumadin monitoring by primary or coumadin clinic - not at the HD unit 6. GI bleed- Reports blood in stool, management per primary 7. CAD/iCM/CHF- cards managing. 8. DM2: SSI per primary 9. C Diff colitis- on Vanc PO per primary. 10. Decubitus ulcers with another wound in right groin. Wound care has seen. See their note of 5/9  Katina Degree. Jimmey Ralph, MD [facility] Resident PGY-2 04/08/2016 9:07 AM   I have seen and examined this patient and agree with plan and assessment in the above note. HD today on schedule. Coumadin not yet therapeutic. Has outpt TTS [place] spot for HD when ready for home.Camille Bal B,MD 04/08/2016 11:16 AM

## 2016-04-08 NOTE — Procedures (Signed)
I have personally attended this patient's dialysis session.   K 4 2K bath Hb 8.5  TDC L groin 400 no issues Goal 1.9 to goal weight of 105 kg  Camille Bal, MD [facility] [phone] Pager 04/08/2016, 11:15 AM

## 2016-04-08 NOTE — Progress Notes (Signed)
ANTICOAGULATION CONSULT NOTE - Follow Up Consult  Pharmacy Consult for heparin/coumadin Indication: DVT (March 2017)  Allergies  Allergen Reactions  . Entresto [Sacubitril-Valsartan] Other (See Comments)    On MAR    Patient Measurements: Height: 5\' 4"  (162.6 cm) Weight: 235 lb 10.8 oz (106.9 kg) IBW/kg (Calculated) : 54.7 Heparin Dosing Weight: 80 kg  Vital Signs: Temp: 97 F (36.1 C) (05/13 0715) Temp Source: Oral (05/13 0715) BP: 113/73 mmHg (05/13 0716) Pulse Rate: 75 (05/13 0716)  Labs:  Recent Labs  04/06/16 0350  04/06/16 0358 04/06/16 1936 04/07/16 1107 04/07/16 1128 04/08/16 0634  HGB  --   < > 7.1*  --  8.4*  --  8.5*  HCT  --   --  22.1*  --  25.9*  --  26.1*  PLT  --   --  195  --  215  --  225  LABPROT  --   --  18.3*  --  18.5*  --  18.3*  INR  --   --  1.51*  --  1.53*  --  1.51*  HEPARINUNFRC  --   < > 0.17* 0.25* 0.30  --  0.61  CREATININE 4.45*  --   --   --   --  3.54* 4.58*  < > = values in this interval not displayed.  Estimated Creatinine Clearance: 14.6 mL/min (by C-G formula based on Cr of 4.58).   Medications:  Prescriptions prior to admission  Medication Sig Dispense Refill Last Dose  . acetaminophen (TYLENOL) 325 MG tablet Take 650 mg by mouth every 6 (six) hours as needed for mild pain.   Past Week at Unknown time  . albuterol (PROVENTIL HFA;VENTOLIN HFA) 108 (90 Base) MCG/ACT inhaler Inhale 2 puffs into the lungs every 6 (six) hours as needed for wheezing or shortness of breath.    rescue at rescue  . atorvastatin (LIPITOR) 10 MG tablet Take 10 mg by mouth daily.   03/19/2016 at Unknown time  . beclomethasone (QVAR) 80 MCG/ACT inhaler Inhale 2 puffs into the lungs 2 (two) times daily.   03/19/2016 at Unknown time  . carvedilol (COREG) 12.5 MG tablet Take 12.5 mg by mouth 2 (two) times daily.   03/19/2016 at 2107  . insulin glargine (LANTUS) 100 UNIT/ML injection Inject 18 Units into the skin at bedtime.   03/19/2016 at Unknown time  .  insulin lispro (HUMALOG) 100 UNIT/ML injection Inject 5 Units into the skin 3 (three) times daily with meals. For CBG > 180   03/19/2016 at Unknown time  . LORazepam (ATIVAN) 0.5 MG tablet Take 0.5 mg by mouth 2 (two) times daily. Stop date 03/20/16   03/19/2016 at unk  . omeprazole (PRILOSEC) 20 MG capsule Take 1 capsule (20 mg total) by mouth daily. 30 capsule 0 03/20/2016 at Unknown time  . torsemide (DEMADEX) 100 MG tablet Take 20-100 mg by mouth See admin instructions. Per MAR, pt given 100mg  in am,  20mg  in afternoon, and 80mg  in evening on 4/23   unk at unk  . warfarin (COUMADIN) 2 MG tablet Take 2 mg by mouth daily. Take along with 2.5 mg= 4.5 mg daily   03/19/2016 at 2107  . warfarin (COUMADIN) 2.5 MG tablet Take 2.5 mg by mouth daily. Take along with 2 mg= 4.5 mg daily   03/19/2016 at 2107  . Inulin (FIBER CHOICE) 1.5 g CHEW Chew 2 tablets by mouth daily as needed (for constipation).    unk at [employer]  Assessment: 65 yo f admitted 03/20/2016 on warfarin PTA for recent RIJ DVT (March 2017). INR on admission was supratherapeutic, warfarin was reversed and then held from HD cath placement. Pharmacy consulted for heparin and then resumed warfarin 5/9.  HL 0.61 (therapeutic), INR 1.51, Hg 8.5. PLTC WNL. No s/sx of bleeding noted.  Goal of Therapy:  INR 2-3 Heparin level 0.3-0.7 units/ml Monitor platelets by anticoagulation protocol: Yes   Plan:  - Continue heparin 2000 units/hr - Give coumadin 7.5 mg x1 tonight - Monitor daily HL, INR, CBC and s/sx of bleeding  Casilda Carls, PharmD. PGY-1 Pharmacy Resident Pager: [phone] 04/08/2016,8:26 AM

## 2016-04-08 NOTE — Progress Notes (Signed)
PROGRESS NOTE    Brandy Dudley  ZOX:[id number] DOB: [birth year] DOA: 03/20/2016  PCP: case manager consulted for assistance with setting pt up with PCP   Brief Narrative:  65 y.o. admitted from [facility] to IM service per notes for further evaluation for C. Diff colitis, Acute CHF exacerbation, and ARF on CKD stage III. HF team consulted to assist with fluid management.  Found to need HD  And awaiting INR of < 1.3 for perm cath and AV fistula  Assessment & Plan:   C difficile colitis - on clindamycin recently for MRSA breast abscess - s/p po vancomycin until  5/8 - no diarrhea reported since   Acute on chronic systolic CHF: Ischemic cardiomyopathy with history of prior CAD  - Echo 03/21/16 35-40% with Grade 2 DD, mod dilated RV, mild LAE, mod RAE, Severe TR, Peak PA 38 mm Hg. - Continue coreg 3.125 mg twice a day. - appreciate HF team - goal weight 105 kg   AKI on CKD, now ESRD - HD TTS via left femoral TCD - set up HD at [facility] TTS, 2nd shift   R IJ DVT in 3/17 - resumed Coumadin, INR 1.53 this AM  CAD - On ASA + Plavix in the past. - held due to hematuria on last admission (foley trauma) - continue statin      Anemia of chronic disease, ? GI bleed, Thrombocytopenia  - Ferritin 181, Fe 31 - aranesp 100 Q Tuesday with HD - Hg up from 7.1 --> 8.4 post transfusion of one unit of PRBC - CBC in AM   Immobility - SNF eventually (from [facility]), likely early next week   Macerated skin - WOC consult: Foam dressing to protect and promote healing to right lower breast.  - Aquacel to right groin to absorb drainage and provide antimicrobilal benefits.  - Barrier cream to inner gluteal fold and buttocks to protect and repel moisture.  - overall improving   DM type II with complications of nephropathy  - on SSI for now - reasonable inpatient control   Morbid Obesity  - Body mass index is 39.71 kg/(m^2).  DVT prophylaxis: restarted coumadin 5/9  Code Status:  Full Code  Family Communication: No family at bedside  Disposition Plan: From [facility], likely go back on Monday as she can not start HD over the weekend. Will ask CM to set up with PCP where pt can follow up and have PT/INR checked   Consultants:   CHF team  PCCM  Nephrology   Procedures:   None  Subjective: Reports feeling better. Currently on HD, tolerating well.   Objective: Filed Vitals:   04/08/16 0930 04/08/16 1000 04/08/16 1030 04/08/16 1100  BP: 110/67 115/65 117/69 108/63  Pulse: 78 77 79 78  Temp:      TempSrc:      Resp:      Height:      Weight:      SpO2:        Intake/Output Summary (Last 24 hours) at 04/08/16 1125 Last data filed at 04/08/16 0703  Gross per 24 hour  Intake    480 ml  Output      0 ml  Net    480 ml   Filed Weights   04/06/16 2322 04/07/16 2102 04/08/16 0715  Weight: 106.5 kg (234 lb 12.6 oz) 110.4 kg (243 lb 6.2 oz) 106.9 kg (235 lb 10.8 oz)   Examination:  General exam: Appears calm and comfortable  Respiratory system: Clear to auscultation. Respiratory effort normal. Cardiovascular system: S1 & S2 heard, RRR. +edema Gastrointestinal system: Abdomen is nondistended, soft and nontender.  Central nervous system: Alert and oriented. No focal neurological deficits. Extremities: new left fem TDC with dressing in place, LUE BC AVF with faint bruit and no palpable thrill, trace B LE edema   Data Reviewed: I have personally reviewed following labs and imaging studies  CBC:  Recent Labs Lab 04/04/16 0747 04/05/16 0506 04/06/16 0358 04/07/16 1107 04/08/16 0634  WBC 7.7 7.4 6.7 7.5 7.1  HGB 8.4* 7.1* 7.1* 8.4* 8.5*  HCT 26.7* 22.2* 22.1* 25.9* 26.1*  MCV 90.8 90.6 89.5 89.0 89.4  PLT 180 168 195 215 225   Basic Metabolic Panel:  Recent Labs Lab 04/03/16 1423 04/04/16 0903 04/05/16 0500 04/06/16 0350 04/07/16 1128 04/08/16 0634  NA  --  138 136 135 136 135  K  --  4.4 3.7 3.9 4.4 4.0  CL  --  101 97* 97* 100*  98*  CO2  --  25 28 25  21* 25  GLUCOSE  --  141* 145* 134* 100* 123*  BUN  --  41* 23* 28* 18 25*  CREATININE  --  4.26* 3.59* 4.45* 3.54* 4.58*  CALCIUM  --  9.0 8.6* 8.9 8.7* 9.3  MG 2.1  --   --   --   --   --   PHOS  --  5.4* 4.1 4.6 3.2 4.0    Liver Function Tests:  Recent Labs Lab 04/04/16 0903 04/05/16 0500 04/06/16 0350 04/07/16 1128 04/08/16 0634  ALBUMIN 2.6* 2.5* 2.5* 2.6* 2.6*   Coagulation Profile:  Recent Labs Lab 04/04/16 0747 04/05/16 0506 04/06/16 0358 04/07/16 1107 04/08/16 0634  INR 1.61* 1.49 1.51* 1.53* 1.51*   CBG:  Recent Labs Lab 04/07/16 0037 04/07/16 0853 04/07/16 1157 04/07/16 1759 04/07/16 2059  GLUCAP 106* 106* 105* 130* 124*   Urine analysis:    Component Value Date/Time   COLORURINE AMBER* 03/14/2016 1655   APPEARANCEUR CLOUDY* 03/14/2016 1655   LABSPEC 1.019 03/14/2016 1655   PHURINE 5.0 03/14/2016 1655   GLUCOSEU NEGATIVE 03/14/2016 1655   HGBUR LARGE* 03/14/2016 1655   BILIRUBINUR SMALL* 03/14/2016 1655   KETONESUR 15* 03/14/2016 1655   PROTEINUR 30* 03/14/2016 1655   NITRITE NEGATIVE 03/14/2016 1655   LEUKOCYTESUR TRACE* 03/14/2016 1655    Anti-infectives    Start     Dose/Rate Route Frequency Ordered Stop   04/03/16 0000  ceFAZolin (ANCEF) IVPB 1 g/50 mL premix  Status:  Discontinued    Comments:  Send with pt to OR   1 g 100 mL/hr over 30 Minutes Intravenous On call 04/02/16 0724 04/02/16 1350   04/03/16 0000  ceFAZolin (ANCEF) IVPB 2g/100 mL premix    Comments:  Send with pt to OR   2 g 200 mL/hr over 30 Minutes Intravenous On call 04/02/16 1350 04/03/16 1200   03/29/16 1127  cefUROXime (ZINACEF) 1.5 g in dextrose 5 % 50 mL IVPB     1.5 g 100 mL/hr over 30 Minutes Intravenous To [facility] 03/28/16 2305 03/30/16 1130   03/23/16 1400  vancomycin (VANCOCIN) 50 mg/mL oral solution 250 mg     250 mg Oral 4 times daily 03/23/16 1051 04/03/16 1759   03/20/16 1800  vancomycin (VANCOCIN) 50 mg/mL oral  solution 125 mg  Status:  Discontinued     125 mg Oral 4 times daily 03/20/16 1637 03/23/16 1051   03/20/16 1230  metroNIDAZOLE (  FLAGYL) IVPB 500 mg  Status:  Discontinued     500 mg 100 mL/hr over 60 Minutes Intravenous Every 8 hours 03/20/16 1218 03/20/16 1538     Radiology Studies: No results found.  Scheduled Meds: . sodium chloride   Intravenous Once  . sodium chloride   Intravenous Once  . antiseptic oral rinse  7 mL Mouth Rinse BID  . atorvastatin  10 mg Oral Daily  . budesonide (PULMICORT) nebulizer solution  0.5 mg Nebulization BID  . carvedilol  3.125 mg Oral BID WC  . darbepoetin (ARANESP) injection - DIALYSIS  100 mcg Intravenous Q Tue-HD  . famotidine  20 mg Oral Daily  . ferric gluconate (FERRLECIT/NULECIT) IV  125 mg Intravenous Q T,Th,Sa-HD  . insulin aspart  0-20 Units Subcutaneous TID WC  . insulin aspart  0-5 Units Subcutaneous QHS  . insulin aspart  4 Units Subcutaneous TID WC  . multivitamin  1 tablet Oral QHS  . warfarin  7.5 mg Oral ONCE-1800  . Warfarin - Pharmacist Dosing Inpatient   Does not apply q1800   Continuous Infusions: . sodium chloride 10 mL/hr at 04/03/16 0905  . heparin 2,000 Units/hr (04/08/16 0343)     LOS: 19 days   Time spent: 25 min  Debbora Presto, MD [facility] Pager [phone]  If 7PM-7AM, please contact night-coverage [email or web address] Password [facility] 04/08/2016, 11:25 AM

## 2016-04-09 LAB — RENAL FUNCTION PANEL
Albumin: 2.6 g/dL — ABNORMAL LOW (ref 3.5–5.0)
Anion gap: 11 (ref 5–15)
BUN: 16 mg/dL (ref 6–20)
CHLORIDE: 96 mmol/L — AB (ref 101–111)
CO2: 28 mmol/L (ref 22–32)
CREATININE: 3.78 mg/dL — AB (ref 0.44–1.00)
Calcium: 8.8 mg/dL — ABNORMAL LOW (ref 8.9–10.3)
GFR calc Af Amer: 13 mL/min — ABNORMAL LOW (ref >60)
GFR calc non Af Amer: 12 mL/min — ABNORMAL LOW (ref >60)
GLUCOSE: 120 mg/dL — AB (ref 65–99)
Phosphorus: 3.6 mg/dL (ref 2.5–4.6)
Potassium: 3.7 mmol/L (ref 3.5–5.1)
Sodium: 135 mmol/L (ref 135–145)

## 2016-04-09 LAB — CBC
HEMATOCRIT: 25.7 % — AB (ref 36.0–46.0)
Hemoglobin: 7.9 g/dL — ABNORMAL LOW (ref 12.0–15.0)
MCH: 27.4 pg (ref 26.0–34.0)
MCHC: 30.7 g/dL (ref 30.0–36.0)
MCV: 89.2 fL (ref 78.0–100.0)
Platelets: 221 10*3/uL (ref 150–400)
RBC: 2.88 MIL/uL — ABNORMAL LOW (ref 3.87–5.11)
RDW: 18.4 % — AB (ref 11.5–15.5)
WBC: 7.6 10*3/uL (ref 4.0–10.5)

## 2016-04-09 LAB — PROTIME-INR
INR: 1.57 — ABNORMAL HIGH (ref 0.00–1.49)
Prothrombin Time: 18.8 seconds — ABNORMAL HIGH (ref 11.6–15.2)

## 2016-04-09 LAB — GLUCOSE, CAPILLARY
GLUCOSE-CAPILLARY: 106 mg/dL — AB (ref 65–99)
GLUCOSE-CAPILLARY: 126 mg/dL — AB (ref 65–99)
Glucose-Capillary: 119 mg/dL — ABNORMAL HIGH (ref 65–99)
Glucose-Capillary: 153 mg/dL — ABNORMAL HIGH (ref 65–99)

## 2016-04-09 LAB — HEPARIN LEVEL (UNFRACTIONATED)
HEPARIN UNFRACTIONATED: 0.69 [IU]/mL (ref 0.30–0.70)
Heparin Unfractionated: 0.73 IU/mL — ABNORMAL HIGH (ref 0.30–0.70)

## 2016-04-09 MED ORDER — HEPARIN (PORCINE) IN NACL 100-0.45 UNIT/ML-% IJ SOLN
1850.0000 [IU]/h | INTRAMUSCULAR | Status: DC
  Filled 2016-04-09: qty 250

## 2016-04-09 MED ORDER — FAMOTIDINE 20 MG PO TABS: 20.0000 mg | ORAL_TABLET | Freq: Every day | ORAL | Status: DC

## 2016-04-09 MED ORDER — BUDESONIDE 0.5 MG/2ML IN SUSP: 0.5000 mg | Freq: Two times a day (BID) | RESPIRATORY_TRACT | Status: DC

## 2016-04-09 MED ORDER — RENA-VITE PO TABS: 1.0000 | ORAL_TABLET | Freq: Every day | ORAL | Status: DC

## 2016-04-09 MED ORDER — ALBUTEROL SULFATE (2.5 MG/3ML) 0.083% IN NEBU
2.5000 mg | INHALATION_SOLUTION | Freq: Four times a day (QID) | RESPIRATORY_TRACT | Status: DC | PRN
Start: 2016-04-09 — End: 2016-04-10

## 2016-04-09 MED ORDER — CARVEDILOL 3.125 MG PO TABS: 3.1250 mg | ORAL_TABLET | Freq: Two times a day (BID) | ORAL | Status: DC

## 2016-04-09 MED ORDER — WARFARIN SODIUM 7.5 MG PO TABS
7.5000 mg | ORAL_TABLET | Freq: Once | ORAL | Status: AC
  Administered 2016-04-09: 7.5 mg via ORAL
  Filled 2016-04-09: qty 1

## 2016-04-09 NOTE — Progress Notes (Signed)
ANTICOAGULATION CONSULT NOTE - FOLLOW UP    HL = 0.69 (goal 0.3 - 0.7 units/mL) Heparin dosing weight = 79 kg   Assessment: 64 YOF with history of recent RIJ DVT to continue on IV heparin bridge to Coumadin.  Heparin level trended down to therapeutic level but remains toward the high end of goal.  No bleeding reported.   Plan: - Reduce heparin gtt slightly to 1850 units/hr - F/U AM labs    Deonta Bomberger D. Laney Potash, PharmD, BCPS 04/09/2016, 5:48 PM

## 2016-04-09 NOTE — Progress Notes (Signed)
ANTICOAGULATION CONSULT NOTE - Follow Up Consult  Pharmacy Consult for heparin/coumadin Indication: DVT (March 2017)  Allergies  Allergen Reactions  . Entresto [Sacubitril-Valsartan] Other (See Comments)    On MAR    Patient Measurements: Height:  (162.6 cm) Weight: 230 lb 13.2 oz (104.7 kg) IBW/kg (Calculated) : 54.7 Heparin Dosing Weight: 79 kg  Vital Signs: Temp: 98 F (36.7 C) (05/14 0443) Temp Source: Oral (05/14 0443) BP: 116/60 mmHg (05/14 0443) Pulse Rate: 72 (05/14 0443)  Labs:  Recent Labs  04/07/16 1107 04/07/16 1128 04/08/16 0634 04/09/16 0715  HGB 8.4*  --  8.5* 7.9*  HCT 25.9*  --  26.1* 25.7*  PLT 215  --  225 221  LABPROT 18.5*  --  18.3* 18.8*  INR 1.53*  --  1.51* 1.57*  HEPARINUNFRC 0.30  --  0.61 0.73*  CREATININE  --  3.54* 4.58*  --     Estimated Creatinine Clearance: 14.4 mL/min (by C-G formula based on Cr of 4.58).   Medications:  Prescriptions prior to admission  Medication Sig Dispense Refill Last Dose  . acetaminophen (TYLENOL) 325 MG tablet Take 650 mg by mouth every 6 (six) hours as needed for mild pain.   Past Week at Unknown time  . albuterol (PROVENTIL HFA;VENTOLIN HFA) 108 (90 Base) MCG/ACT inhaler Inhale 2 puffs into the lungs every 6 (six) hours as needed for wheezing or shortness of breath.    rescue at rescue  . atorvastatin (LIPITOR) 10 MG tablet Take 10 mg by mouth daily.   03/19/2016 at Unknown time  . beclomethasone (QVAR) 80 MCG/ACT inhaler Inhale 2 puffs into the lungs 2 (two) times daily.   03/19/2016 at Unknown time  . carvedilol (COREG) 12.5 MG tablet Take 12.5 mg by mouth 2 (two) times daily.   03/19/2016 at 2107  . insulin glargine (LANTUS) 100 UNIT/ML injection Inject 18 Units into the skin at bedtime.   03/19/2016 at Unknown time  . insulin lispro (HUMALOG) 100 UNIT/ML injection Inject 5 Units into the skin 3 (three) times daily with meals. For CBG > 180   03/19/2016 at Unknown time  . LORazepam (ATIVAN) 0.5 MG  tablet Take 0.5 mg by mouth 2 (two) times daily. Stop date 03/20/16   03/19/2016 at unk  . omeprazole (PRILOSEC) 20 MG capsule Take 1 capsule (20 mg total) by mouth daily. 30 capsule 0 03/20/2016 at Unknown time  . torsemide (DEMADEX) 100 MG tablet Take 20-100 mg by mouth See admin instructions. Per MAR, pt given  in am,   in afternoon, and  in evening on 4/23   unk at unk  . warfarin (COUMADIN) 2 MG tablet Take 2 mg by mouth daily. Take along with 2.5 mg= 4.5 mg daily   03/19/2016 at 2107  . warfarin (COUMADIN) 2.5 MG tablet Take 2.5 mg by mouth daily. Take along with 2 mg= 4.5 mg daily   03/19/2016 at 2107  . Inulin (FIBER CHOICE) 1.5 g CHEW Chew 2 tablets by mouth daily as needed (for constipation).    unk at unk    Assessment: 65 yo f admitted 03/20/2016 on warfarin PTA for recent RIJ DVT (March 2017). INR on admission was supratherapeutic, warfarin was reversed and then held for HD cath placement. Pharmacy consulted for heparin and then resumed warfarin 5/9.  HL 0.73 (slightly supratherapeutic), INR 1.57, Hgb 7.9, Plt 221. No s/sx of bleeding noted.  Goal of Therapy:  INR 2-3 Heparin level 0.3-0.7 units/ml Monitor platelets by anticoagulation protocol:  Yes   Plan:  - Decrease heparin to 1900 units/hr - Warfarin 7.5 mg x1 tonight  - Check 8hr HL  - Monitor daily HL, INR, CBC and s/sx of bleeding  Casilda Carls, PharmD. PGY-1 Pharmacy Resident Pager: [phone] 04/09/2016,8:31 AM

## 2016-04-09 NOTE — Progress Notes (Signed)
[facility] Rounding Note  Subjective/Interim Events:  Seen in HD this morning. Sleeping. No complaints.   Objective: Vital signs in last 24 hours: Temp:  [97 F (36.1 C)-98 F (36.7 C)] 98 F (36.7 C) (05/14 0443) Pulse Rate:  [72-82] 72 (05/14 0443) Resp:  [18-22] 19 (05/14 0443) BP: (95-117)/(53-69) 116/60 mmHg (05/14 0443) SpO2:  [93 %-99 %] 95 % (05/14 0804) Weight:  [104.7 kg (230 lb 13.2 oz)] 104.7 kg (230 lb 13.2 oz) (05/13 1123) Weight change: -3.5 kg (-7 lb 11.5 oz)   Physical Examination BP 116/60 mmHg  Pulse 72  Temp(Src) 98 F (36.7 C) (Oral)  Resp 19  Ht  (1.626 m)  Wt 104.7 kg (230 lb 13.2 oz)  BMI 39.60 kg/m2  SpO2 95%  General: 65 yo female in NAD, lying in bed Very pleasant, no complaints CV: Regular S1S2 No S3 PULM: Lungs clear Extremities: No edema Access: -Left fem TDC with dsg in place (5/8)  -Left upper arm BC AVF +  bruit (5/8)   Lab Results:  Recent Labs  04/08/16 0634 04/09/16 0715  WBC 7.1 7.6  HGB 8.5* 7.9*  HCT 26.1* 25.7*  PLT 225 221   PTH  Date/Time Value Ref Range Status  04/04/2016 09:10 AM 77* 15 - 65 pg/mL Final    Comment:    (NOTE) Performed At: [facility] [address] [place], [place] [id number] Mila Homer MD WU:[id number]        Component Value Date/Time   IRON 31 04/04/2016 0748   TIBC 328 04/04/2016 0748   FERRITIN 181 04/04/2016 0748   IRONPCTSAT 9* 04/04/2016 0748    Recent Labs  04/08/16 0634 04/09/16 0715  NA 135 135  K 4.0 3.7  CL 98* 96*  CO2 25 28  GLUCOSE 123* 120*  BUN 25* 16  CREATININE 4.58* 3.78*  CALCIUM 9.3 8.8*   PHOSPHORUS  Date Value Ref Range Status  04/09/2016 3.6 2.5 - 4.6 mg/dL Final  95/62/1308 4.0 2.5 - 4.6 mg/dL Final  65/78/4696 3.2 2.5 - 4.6 mg/dL Final  29/52/8413 4.6 2.5 - 4.6 mg/dL Final  24/40/1027 4.1 2.5 - 4.6 mg/dL Final    Scheduled Medications: . sodium chloride   Intravenous Once  . sodium chloride   Intravenous Once  . antiseptic  oral rinse  7 mL Mouth Rinse BID  . atorvastatin  10 mg Oral Daily  . budesonide (PULMICORT) nebulizer solution  0.5 mg Nebulization BID  . carvedilol  3.125 mg Oral BID WC  . darbepoetin (ARANESP) injection - DIALYSIS  100 mcg Intravenous Q Tue-HD  . famotidine  20 mg Oral Daily  . ferric gluconate (FERRLECIT/NULECIT) IV  125 mg Intravenous Q T,Th,Sa-HD  . insulin aspart  0-20 Units Subcutaneous TID WC  . insulin aspart  0-5 Units Subcutaneous QHS  . insulin aspart  4 Units Subcutaneous TID WC  . multivitamin  1 tablet Oral QHS  . warfarin  7.5 mg Oral ONCE-1800  . Warfarin - Pharmacist Dosing Inpatient   Does not apply q1800    Background Pt is a 65 y.o. yo female who was admitted on 03/20/2016 with C Diff colitis and acute respiratory distress/ AoCHF, developed AKI on CKD->new ESRD d/t cardiorenal syndrome. Started on CVVHD and transitioned to IHD on 5/1. Right femoral dialysis catheter removed 5/6. Left femoral tunneled dialysis catheter placed on 5/8. Left upper arm AVF placed 5/8.   Other probs COPD, DM2, AICD in place.   1. AKI/CKD->new ESRD in  the setting of decompensated biventricular CHF:  1. HD TTS via left femoral TDC 2. Has outpt HD spot at [facility] TTS 2nd shift s/p HD 5/13 3. EDW around 105kg 4. LUE AVF placed 5/8, TDC left groin 5/8 2. Anemia/Thrombocytopenia: Hgb 7-8 during this admission. Ferritin 181, fe 31. S/p 1u pRBC 5/11.  1. Aranesp 100 every Tuesday with HD (started 5/9) 2. 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 Phos good - no binders needed 4. Vascular access - L fem TDC/L BC AVF (5/8 Dr. Arbie Cookey).  1. On same side as the ICD. May not mature but bruit increasing in intensity. No arm edema.  5. H/o DVT R IJ -  Pharmacy following/ management per primary- at discharge will need coumadin monitoring by primary or coumadin clinic - not at the HD unit 6. CAD/iCM/CHF- cards managing. 7. DM2: SSI per  primary 8. C Diff colitis- finished po vanco 5/8 9. Decubitus ulcers with another wound in right groin. Wound care has seen. See their note of 5/9   Alyssa A. Kennon Rounds MD, MS Family Medicine Resident PGY-2 Pager [phone]  Agree with the above note. Anticipate discharge tomorrow? Has outpt HD spot TTS Paula Libra, MD [facility] [phone] Pager 04/09/2016, 12:22 PM

## 2016-04-09 NOTE — Progress Notes (Signed)
PROGRESS NOTE    Brandy Dudley  ZOX:[id number] DOB: 07/08/1951 DOA: 03/20/2016  PCP: case manager consulted for assistance with setting pt up with PCP   Brief Narrative:  65 y.o. admitted from [place] [facility] to IM service per notes for further evaluation for C. Diff colitis, Acute CHF exacerbation, and ARF on CKD stage III. HF team consulted to assist with fluid management.  Found to need HD  And awaiting INR of < 1.3 for perm cath and AV fistula  Assessment & Plan:   C difficile colitis - on clindamycin recently for MRSA breast abscess - s/p po vancomycin until  5/8 - no diarrhea reported since   Acute on chronic systolic CHF: Ischemic cardiomyopathy with history of prior CAD  - Echo 03/21/16 35-40% with Grade 2 DD, mod dilated RV, mild LAE, mod RAE, Severe TR, Peak PA 38 mm Hg. - Continue coreg 3.125 mg twice a day. - appreciate HF team, weight 104 kg  - goal weight 105 kg   AKI on CKD, now ESRD - HD TTS via left femoral TCD - set up HD at [facility] TTS, 2nd shift   R IJ DVT in 3/17 - resumed Coumadin, INR 1.57 this AM  CAD - On ASA + Plavix in the past. - held due to hematuria on last admission (foley trauma) - continue statin      Anemia of chronic disease, ? GI bleed, Thrombocytopenia  - Ferritin 181, Fe 31 - aranesp 100 Q Tuesday with HD - Hg up from 7.1 --> 8.4 post transfusion of one unit of PRBC - Hg 7.9 this AM - CBC in AM   Immobility - SNF eventually (from [facility]), likely early next week   Macerated skin - WOC consult: Foam dressing to protect and promote healing to right lower breast.  - Aquacel to right groin to absorb drainage and provide antimicrobilal benefits.  - Barrier cream to inner gluteal fold and buttocks to protect and repel moisture.  - overall improving   DM type II with complications of nephropathy  - on SSI for now - reasonable inpatient control   Morbid Obesity  - Body mass index is 39.71 kg/(m^2).  DVT prophylaxis:  restarted coumadin 5/9  Code Status: Full Code  Family Communication: No family at bedside  Disposition Plan: From [facility], likely go back in AM  Consultants:   CHF team  PCCM  Nephrology   Procedures:   None  Subjective: Reports feeling better. Currently on HD, tolerating well.   Objective: Filed Vitals:   04/08/16 2103 04/09/16 0443 04/09/16 0804 04/09/16 0920  BP: 95/53 116/60  110/50  Pulse: 78 72  75  Temp: 97.6 F (36.4 C) 98 F (36.7 C)  98 F (36.7 C)  TempSrc: Oral Oral  Oral  Resp: Height:      Weight:      SpO2: 99% 99% 95% 95%    Intake/Output Summary (Last 24 hours) at 04/09/16 1229 Last data filed at 04/09/16 1100  Gross per 24 hour  Intake 1762.33 ml  Output      2 ml  Net 1760.33 ml   Filed Weights   04/07/16 2102 04/08/16 0715 04/08/16 1123  Weight: 110.4 kg (243 lb 6.2 oz) 106.9 kg (235 lb 10.8 oz) 104.7 kg (230 lb 13.2 oz)   Examination:  General exam: Appears calm and comfortable  Respiratory system: Clear to auscultation. Respiratory effort normal. Cardiovascular system: S1 & S2 heard, RRR. +  edema Gastrointestinal system: Abdomen is nondistended, soft and nontender.  Central nervous system: Alert and oriented. No focal neurological deficits. Extremities: new left fem TDC with dressing in place, LUE BC AVF with faint bruit and no palpable thrill, trace B LE edema   Data Reviewed: I have personally reviewed following labs and imaging studies  CBC:  Recent Labs Lab 04/05/16 0506 04/06/16 0358 04/07/16 1107 04/08/16 0634 04/09/16 0715  WBC 7.4 6.7 7.5 7.1 7.6  HGB 7.1* 7.1* 8.4* 8.5* 7.9*  HCT 22.2* 22.1* 25.9* 26.1* 25.7*  MCV 90.6 89.5 89.0 89.4 89.2  PLT 168 195 215 225 221   Basic Metabolic Panel:  Recent Labs Lab 04/03/16 1423  04/05/16 0500 04/06/16 0350 04/07/16 1128 04/08/16 0634 04/09/16 0715  NA  --   < > 136 135 136 135 135  K  --   < > 3.7 3.9 4.4 4.0 3.7  CL  --   < > 97* 97* 100* 98*  96*  CO2  --   < > 28 25 21* 25 28  GLUCOSE  --   < > 145* 134* 100* 123* 120*  BUN  --   < > 23* 28* 18 25* 16  CREATININE  --   < > 3.59* 4.45* 3.54* 4.58* 3.78*  CALCIUM  --   < > 8.6* 8.9 8.7* 9.3 8.8*  MG 2.1  --   --   --   --   --   --   PHOS  --   < > 4.1 4.6 3.2 4.0 3.6  < > = values in this interval not displayed.  Liver Function Tests:  Recent Labs Lab 04/05/16 0500 04/06/16 0350 04/07/16 1128 04/08/16 0634 04/09/16 0715  ALBUMIN 2.5* 2.5* 2.6* 2.6* 2.6*   Coagulation Profile:  Recent Labs Lab 04/05/16 0506 04/06/16 0358 04/07/16 1107 04/08/16 0634 04/09/16 0715  INR 1.49 1.51* 1.53* 1.51* 1.57*   CBG:  Recent Labs Lab 04/07/16 2059 04/08/16 1210 04/08/16 1609 04/08/16 2127 04/09/16 0803  GLUCAP 124* 95 132* 138* 119*   Urine analysis:    Component Value Date/Time   COLORURINE AMBER* 03/14/2016 1655   APPEARANCEUR CLOUDY* 03/14/2016 1655   LABSPEC 1.019 03/14/2016 1655   PHURINE 5.0 03/14/2016 1655   GLUCOSEU NEGATIVE 03/14/2016 1655   HGBUR LARGE* 03/14/2016 1655   BILIRUBINUR SMALL* 03/14/2016 1655   KETONESUR 15* 03/14/2016 1655   PROTEINUR 30* 03/14/2016 1655   NITRITE NEGATIVE 03/14/2016 1655   LEUKOCYTESUR TRACE* 03/14/2016 1655    Anti-infectives    Start     Dose/Rate Route Frequency Ordered Stop   04/03/16 0000  ceFAZolin (ANCEF) IVPB 1 g/50 mL premix  Status:  Discontinued    Comments:  Send with pt to OR   1 g 100 mL/hr over 30 Minutes Intravenous On call 04/02/16 0724 04/02/16 1350   04/03/16 0000  ceFAZolin (ANCEF) IVPB 2g/100 mL premix    Comments:  Send with pt to OR   2 g 200 mL/hr over 30 Minutes Intravenous On call 04/02/16 1350 04/03/16 1200   03/29/16 1127  cefUROXime (ZINACEF) 1.5 g in dextrose 5 % 50 mL IVPB     1.5 g 100 mL/hr over 30 Minutes Intravenous To [facility] 03/28/16 2305 03/30/16 1130   03/23/16 1400  vancomycin (VANCOCIN) 50 mg/mL oral solution 250 mg     250 mg Oral 4 times daily 03/23/16  1051 04/03/16 1759   03/20/16 1800  vancomycin (VANCOCIN) 50 mg/mL oral solution 125 mg  Status:  Discontinued     125 mg Oral 4 times daily 03/20/16 1637 03/23/16 1051   03/20/16 1230  metroNIDAZOLE (FLAGYL) IVPB 500 mg  Status:  Discontinued     500 mg 100 mL/hr over 60 Minutes Intravenous Every 8 hours 03/20/16 1218 03/20/16 1538     Radiology Studies: No results found.  Scheduled Meds: . sodium chloride   Intravenous Once  . sodium chloride   Intravenous Once  . antiseptic oral rinse  7 mL Mouth Rinse BID  . atorvastatin  10 mg Oral Daily  . budesonide (PULMICORT) nebulizer solution  0.5 mg Nebulization BID  . carvedilol  3.125 mg Oral BID WC  . darbepoetin (ARANESP) injection - DIALYSIS  100 mcg Intravenous Q Tue-HD  . famotidine  20 mg Oral Daily  . ferric gluconate (FERRLECIT/NULECIT) IV  125 mg Intravenous Q T,Th,Sa-HD  . insulin aspart  0-20 Units Subcutaneous TID WC  . insulin aspart  0-5 Units Subcutaneous QHS  . insulin aspart  4 Units Subcutaneous TID WC  . multivitamin  1 tablet Oral QHS  . warfarin  7.5 mg Oral ONCE-1800  . Warfarin - Pharmacist Dosing Inpatient   Does not apply q1800   Continuous Infusions: . sodium chloride 10 mL/hr at 04/03/16 0905  . heparin 1,900 Units/hr (04/09/16 0903)     LOS: 20 days   Time spent: 25 min  Debbora Presto, MD [facility] Pager [phone]  If 7PM-7AM, please contact night-coverage [email or web address] Password [facility] 04/09/2016, 12:29 PM

## 2016-04-10 LAB — RENAL FUNCTION PANEL
ALBUMIN: 2.7 g/dL — AB (ref 3.5–5.0)
ANION GAP: 10 (ref 5–15)
BUN: 24 mg/dL — ABNORMAL HIGH (ref 6–20)
CO2: 28 mmol/L (ref 22–32)
Calcium: 8.9 mg/dL (ref 8.9–10.3)
Chloride: 95 mmol/L — ABNORMAL LOW (ref 101–111)
Creatinine, Ser: 4.96 mg/dL — ABNORMAL HIGH (ref 0.44–1.00)
GFR, EST AFRICAN AMERICAN: 10 mL/min — AB (ref >60)
GFR, EST NON AFRICAN AMERICAN: 8 mL/min — AB (ref >60)
Glucose, Bld: 115 mg/dL — ABNORMAL HIGH (ref 65–99)
PHOSPHORUS: 4.2 mg/dL (ref 2.5–4.6)
POTASSIUM: 3.7 mmol/L (ref 3.5–5.1)
SODIUM: 133 mmol/L — AB (ref 135–145)

## 2016-04-10 LAB — PROTIME-INR
INR: 1.71 — AB (ref 0.00–1.49)
PROTHROMBIN TIME: 20 s — AB (ref 11.6–15.2)

## 2016-04-10 LAB — CBC
HEMATOCRIT: 25.4 % — AB (ref 36.0–46.0)
HEMOGLOBIN: 8.2 g/dL — AB (ref 12.0–15.0)
MCH: 28.6 pg (ref 26.0–34.0)
MCHC: 32.3 g/dL (ref 30.0–36.0)
MCV: 88.5 fL (ref 78.0–100.0)
Platelets: 127 10*3/uL — ABNORMAL LOW (ref 150–400)
RBC: 2.87 MIL/uL — AB (ref 3.87–5.11)
RDW: 18.6 % — ABNORMAL HIGH (ref 11.5–15.5)
WBC: 7.2 10*3/uL (ref 4.0–10.5)

## 2016-04-10 LAB — GLUCOSE, CAPILLARY
Glucose-Capillary: 102 mg/dL — ABNORMAL HIGH (ref 65–99)
Glucose-Capillary: 106 mg/dL — ABNORMAL HIGH (ref 65–99)

## 2016-04-10 LAB — HEPARIN LEVEL (UNFRACTIONATED): HEPARIN UNFRACTIONATED: 0.41 [IU]/mL (ref 0.30–0.70)

## 2016-04-10 MED ORDER — CARVEDILOL 3.125 MG PO TABS: 3.1250 mg | ORAL_TABLET | Freq: Two times a day (BID) | ORAL | Status: DC

## 2016-04-10 MED ORDER — WARFARIN SODIUM 7.5 MG PO TABS: 7.5000 mg | ORAL_TABLET | Freq: Every day | ORAL | Status: DC

## 2016-04-10 MED ORDER — FAMOTIDINE 20 MG PO TABS: 20.0000 mg | ORAL_TABLET | Freq: Every day | ORAL | Status: DC

## 2016-04-10 MED ORDER — BUDESONIDE 0.5 MG/2ML IN SUSP: 0.5000 mg | Freq: Two times a day (BID) | RESPIRATORY_TRACT | Status: DC

## 2016-04-10 MED ORDER — WARFARIN SODIUM 7.5 MG PO TABS: 7.5000 mg | ORAL_TABLET | Freq: Once | ORAL | Status: DC

## 2016-04-10 MED ORDER — ALBUTEROL SULFATE (2.5 MG/3ML) 0.083% IN NEBU: 2.5000 mg | INHALATION_SOLUTION | Freq: Four times a day (QID) | RESPIRATORY_TRACT | Status: DC | PRN

## 2016-04-10 MED ORDER — ENOXAPARIN SODIUM 100 MG/ML ~~LOC~~ SOLN
100.0000 mg | SUBCUTANEOUS | Status: DC
  Administered 2016-04-10: 100 mg via SUBCUTANEOUS
  Filled 2016-04-10: qty 1

## 2016-04-10 MED ORDER — ENOXAPARIN SODIUM 100 MG/ML ~~LOC~~ SOLN: 100.0000 mg | SUBCUTANEOUS | Status: DC

## 2016-04-10 MED ORDER — RENA-VITE PO TABS: 1.0000 | ORAL_TABLET | Freq: Every day | ORAL | Status: DC

## 2016-04-10 MED ORDER — LORAZEPAM 0.5 MG PO TABS: 0.5000 mg | ORAL_TABLET | Freq: Two times a day (BID) | ORAL | Status: DC

## 2016-04-10 NOTE — Clinical Social Work Placement (Signed)
   CLINICAL SOCIAL WORK PLACEMENT  NOTE  Date:  04/10/2016  Patient Details  Name: Brandy Dudley MRN: [id number] Date of Birth: December 15, 1950  Clinical Social Work is seeking post-discharge placement for this patient at the Skilled  Nursing Facility level of care (*CSW will initial, date and re-position this form in  chart as items are completed):  Yes   Patient/family provided with [place] Clinical Social Work Department's list of facilities offering this level of care within the geographic area requested by the patient (or if unable, by the patient's family).  Yes   Patient/family informed of their freedom to choose among providers that offer the needed level of care, that participate in Medicare, Medicaid or managed care program needed by the patient, have an available bed and are willing to accept the patient.  Yes   Patient/family informed of [facility]'s ownership interest in [facility] and [facility], as well as of the fact that they are under no obligation to receive care at these facilities.  PASRR submitted to EDS on 04/07/16     PASRR number received on 04/07/16     Existing PASRR number confirmed on       FL2 transmitted to all facilities in geographic area requested by pt/family on 04/07/16     FL2 transmitted to all facilities within larger geographic area on       Patient informed that his/her managed care company has contracts with or will negotiate with certain facilities, including the following:        Yes   Patient/family informed of bed offers received.  Patient chooses bed at [facility]     Physician recommends and patient chooses bed at      Patient to be transferred to [facility] on 04/10/16.  Patient to be transferred to facility by [facility]     Patient family notified on 04/10/16 of transfer.  Name of family member notified:  Patient calling her family     PHYSICIAN       Additional Comment:     _______________________________________________ Mearl Latin, LCSWA 04/10/2016, 3:56 PM

## 2016-04-10 NOTE — Progress Notes (Signed)
[facility] Rounding Note  Subjective/Interim Events:  Excited for discharge today. Outpatient HD established. Reports new hernia she first noted last week, but denies any acute complaints.  Objective: Vital signs in last 24 hours: Temp:  [97.7 F (36.5 C)-98 F (36.7 C)] 97.9 F (36.6 C) (05/14 2102) Pulse Rate:  [70-75] 74 (05/14 2102) Resp:  [18-20] 18 (05/14 2102) BP: (93-115)/(50-66) 115/66 mmHg (05/14 2102) SpO2:  [95 %-100 %] 100 % (05/14 2102) Weight change:  Total I/O In: 447 [P.O.:240; I.V.:207] Out: -  Physical Examination BP 115/66 mmHg  Pulse 74  Temp(Src) 97.9 F (36.6 C) (Oral)  Resp 18  Ht  (1.626 m)  Wt 230 lb 13.2 oz (104.7 kg)  BMI 39.60 kg/m2  SpO2 100%  General: 65 yo female in NAD, lying in bed. Very pleasant, no complaints CV: Regular S1S2, no murmurs noted PULM: Lungs clear Extremities: No edema Access: -Left fem TDC with dsg in place (5/8)  -Left upper arm BC AVF +  bruit (5/8)   Lab Results:  Recent Labs  04/08/16 0634 04/09/16 0715  WBC 7.1 7.6  HGB 8.5* 7.9*  HCT 26.1* 25.7*  PLT 225 221   PTH  Date/Time Value Ref Range Status  04/04/2016 09:10 AM 77* 15 - 65 pg/mL Final    Comment:    (NOTE) Performed At: [facility] [address] [place], [place] [id number] Mila Homer MD WU:[id number]        Component Value Date/Time   IRON 31 04/04/2016 0748   TIBC 328 04/04/2016 0748   FERRITIN 181 04/04/2016 0748   IRONPCTSAT 9* 04/04/2016 0748    Recent Labs  04/08/16 0634 04/09/16 0715  NA 135 135  K 4.0 3.7  CL 98* 96*  CO2 25 28  GLUCOSE 123* 120*  BUN 25* 16  CREATININE 4.58* 3.78*  CALCIUM 9.3 8.8*   PHOSPHORUS  Date Value Ref Range Status  04/09/2016 3.6 2.5 - 4.6 mg/dL Final  95/62/1308 4.0 2.5 - 4.6 mg/dL Final  65/78/4696 3.2 2.5 - 4.6 mg/dL Final  29/52/8413 4.6 2.5 - 4.6 mg/dL Final  24/40/1027 4.1 2.5 - 4.6 mg/dL Final    Scheduled Medications: . sodium chloride   Intravenous Once  .  sodium chloride   Intravenous Once  . antiseptic oral rinse  7 mL Mouth Rinse BID  . atorvastatin  10 mg Oral Daily  . budesonide (PULMICORT) nebulizer solution  0.5 mg Nebulization BID  . carvedilol  3.125 mg Oral BID WC  . darbepoetin (ARANESP) injection - DIALYSIS  100 mcg Intravenous Q Tue-HD  . famotidine  20 mg Oral Daily  . ferric gluconate (FERRLECIT/NULECIT) IV  125 mg Intravenous Q T,Th,Sa-HD  . insulin aspart  0-20 Units Subcutaneous TID WC  . insulin aspart  0-5 Units Subcutaneous QHS  . insulin aspart  4 Units Subcutaneous TID WC  . multivitamin  1 tablet Oral QHS  . Warfarin - Pharmacist Dosing Inpatient   Does not apply q1800    Background Pt is a 65 y.o. yo female who was admitted on 03/20/2016 with C Diff colitis and acute respiratory distress/ AoCHF, developed AKI on CKD->new ESRD d/t cardiorenal syndrome. Started on CVVHD and transitioned to IHD on 5/1. Right femoral dialysis catheter removed 5/6. Left femoral tunneled dialysis catheter placed on 5/8. Left upper arm AVF placed 5/8.   Other probs COPD, DM2, AICD in place.   1. AKI/CKD->new ESRD in the setting of decompensated biventricular CHF: EDW around 105kg. LUE  AVF placed 5/8, TDC left groin 5/8  HD TTS via left femoral TDC. Has outpt HD spot at [facility] TTS 2nd shift s/p HD 5/13  Ready for discharge   2. Anemia/Thrombocytopenia: Hgb 7-8 during this admission. Ferritin 181, fe 31. S/p 1u pRBC 5/11.   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 Phos good - no binders needed 4. Vascular access - L fem TDC/L BC AVF (5/8 Dr. Arbie Cookey). On same side as the ICD. May not mature but bruit increasing in intensity. No arm edema.  5. H/o DVT R IJ -  Pharmacy following/ management per primary- at discharge will need coumadin monitoring by primary or coumadin clinic - not at the HD unit 6. CAD/iCM/CHF- cards  managing. 7. DM2: SSI per primary 8. C Diff colitis- finished po vanco 5/8 9. Decubitus ulcers with another wound in right groin. Wound care has seen. See their note of 5/9   Dr. Garry Heater, DO Family Medicine Resident PGY-2 Pager [phone]

## 2016-04-10 NOTE — Discharge Summary (Signed)
Physician Discharge Summary  Whitney Brenchley BCW:[id number] DOB: May 10, 1951 DOA: 03/20/2016  PCP: PROVIDER NOT IN SYSTEM  Admit date: 03/20/2016 Discharge date: 04/10/2016  Recommendations for Outpatient Follow-up:  1. Pt will need to follow up with PCP in 1-2 weeks post discharge 2. Pt discharged on Lovenox SQ 100 mg QD for anticipated two more doses until INR therapeutic 3. Continue with Coumadin 7.5 mg PO QD with Lovenox as outlined above and stop Lovenox once INR therapeutic 4. Check PT/INR in 1-2 days and adjust the dose of Coumadin as indicated  5. Pt now with new ESRD in the setting of decompensated biventricular CHF: EDW (estimated dry weight) ~ 105kg.  6. LUE AVF placed 5/8, TDC (temp dialysis cath) left groin 5/8 7. HD TTS via left femoral TDC.  8. Has outpt HD spot at [facility] 2nd shift s/p last HD session 5/13 9. Vascular access - L fem TDC/L BC AVF (5/8 Dr. Arbie Cookey). On same side as the ICD.  10. Decubitus ulcers in the buttocks and right groin, c/w moisture associated skin damage and has contributed to a partial thickness fissure inside the gluteal cleft, near the sacrum region. 1X.1X.1cm, pink and moist.  11. Right lower breast skin fold with partial thickness fissure related to moisture; .1X.5X.1cm, red and moist.  12. Recommendations for wound care --> Foam dressing to protect and promote healing to right lower breast. Aquacel to right groin to absorb drainage and provide antimicrobilal benefits. Barrier cream to inner gluteal fold and buttocks to protect and repel moisture. 13. Please check CBC to follow up on Hg and Plt counts as pt is no Coumadin   Discharge Diagnoses:    Acute on chronic congestive heart failure (HCC)   ESRD  Discharge Condition: Stable  Diet recommendation: Renal diet  Brief Narrative:  65 y.o. admitted from [place] place to IM service per notes for further evaluation for C. Diff colitis, Acute CHF exacerbation, and ARF on CKD stage III. HF  team consulted to assist with fluid management. Found to need HD And awaiting INR of < 1.3 for perm cath and AV fistula  Assessment & Plan:  C difficile colitis - on clindamycin recently for MRSA breast abscess - s/p po vancomycin until 5/8 - no diarrhea reported since   Acute on chronic systolic CHF: Ischemic cardiomyopathy with history of prior CAD  - Echo 03/21/16 35-40% with Grade 2 DD, mod dilated RV, mild LAE, mod RAE, Severe TR, Peak PA 38 mm Hg. - Continue coreg 3.125 mg twice a day. - appreciate HF team, weight 104 kg this AM - goal weight 105 kg   AKI on CKD, now ESRD - HD TTS via left femoral TCD - set up HD at [facility] TTS, 2nd shift   R IJ DVT in 3/17 - resumed Coumadin, continue bridging with Lovenox SQ until INR therapeutic  - INR 1.71 this AM, can repeat in 1-2 days   CAD - was On ASA + Plavix in the past. - held due to hematuria on last admission and has not been restarted since  - continue statin    Anemia of chronic disease, ? GI bleed, Thrombocytopenia  - Ferritin 181, Fe 31 - aranesp 100 Q Tuesday with HD - Hg up from 7.1 --> 8.4 post transfusion of one unit of PRBC - Hg 8.2 this AM  Immobility - SNF   Macerated skin - WOC consult: Foam dressing to protect and promote healing to right lower breast.  - Aquacel  to right groin to absorb drainage and provide antimicrobilal benefits.  - Barrier cream to inner gluteal fold and buttocks to protect and repel moisture.  - overall improving - continue wound care upon discharge   DM type II with complications of nephropathy  - reasonable inpatient control   Thrombocytopenia - reactive  - mild, monitor   Morbid Obesity  - Body mass index is 39.71 kg/(m^2).  DVT prophylaxis: restarted coumadin 5/9  Code Status: Full Code  Family Communication: No family at bedside, sister updated over the phone   Disposition Plan: From [employer], today   Consultants:  1. CHF  team 2. PCCM 3. Nephrology  Procedures:   None  Discharge Exam: Filed Vitals:   04/10/16 0701 04/10/16 0826  BP: 110/59 119/62  Pulse: 74 75  Temp: 98 F (36.7 C) 97.9 F (36.6 C)  Resp: 18 17   Filed Vitals:   04/09/16 2102 04/10/16 0701 04/10/16 0826 04/10/16 0935  BP: 115/66 110/59 119/62   Pulse: 74 74 75   Temp: 97.9 F (36.6 C) 98 F (36.7 C) 97.9 F (36.6 C)   TempSrc: Oral Oral Oral   Resp: Height:      Weight:      SpO2: 100% 97% 100% 100%    General: Pt is alert, follows commands appropriately, not in acute distress Cardiovascular: Regular rate and rhythm, no rubs, no gallops Respiratory: Clear to auscultation bilaterally, no wheezing, no crackles, no rhonchi Abdominal: Soft, non tender, non distended, bowel sounds +, no guarding   Discharge Instructions     Medication List    STOP taking these medications        beclomethasone 80 MCG/ACT inhaler  Commonly known as:  QVAR     torsemide 100 MG tablet  Commonly known as:  DEMADEX      TAKE these medications        acetaminophen 325 MG tablet  Commonly known as:  TYLENOL  Take 650 mg by mouth every 6 (six) hours as needed for mild pain.     albuterol 108 (90 Base) MCG/ACT inhaler  Commonly known as:  PROVENTIL HFA;VENTOLIN HFA  Inhale 2 puffs into the lungs every 6 (six) hours as needed for wheezing or shortness of breath.     albuterol (2.5 MG/3ML) 0.083% nebulizer solution  Commonly known as:  PROVENTIL  Take 3 mLs (2.5 mg total) by nebulization every 6 (six) hours as needed for wheezing or shortness of breath.     atorvastatin 10 MG tablet  Commonly known as:  LIPITOR  Take 10 mg by mouth daily.     budesonide 0.5 MG/2ML nebulizer solution  Commonly known as:  PULMICORT  Take 2 mLs (0.5 mg total) by nebulization 2 (two) times daily.     carvedilol 3.125 MG tablet  Commonly known as:  COREG  Take 1 tablet (3.125 mg total) by mouth 2 (two) times daily with a meal.      enoxaparin 100 MG/ML injection  Commonly known as:  LOVENOX  Inject 1 mL (100 mg total) into the skin daily.     famotidine 20 MG tablet  Commonly known as:  PEPCID  Take 1 tablet (20 mg total) by mouth daily.     FIBER CHOICE 1.5 g Chew  Generic drug:  Inulin  Chew 2 tablets by mouth daily as needed (for constipation).     insulin glargine 100 UNIT/ML injection  Commonly known as:  LANTUS  Inject  18 Units into the skin at bedtime.     insulin lispro 100 UNIT/ML injection  Commonly known as:  HUMALOG  Inject 5 Units into the skin 3 (three) times daily with meals. For CBG > 180     LORazepam 0.5 MG tablet  Commonly known as:  ATIVAN  Take 1 tablet (0.5 mg total) by mouth 2 (two) times daily. Stop date 03/20/16     multivitamin Tabs tablet  Take 1 tablet by mouth at bedtime.     omeprazole 20 MG capsule  Commonly known as:  PRILOSEC  Take 1 capsule (20 mg total) by mouth daily.     warfarin 7.5 MG tablet  Commonly known as:  COUMADIN  Take 1 tablet (7.5 mg total) by mouth daily at 6 PM.            Follow-up Information    Follow up with Gretta Began, MD In 4 weeks.   Specialties:  Vascular Surgery, Cardiology   Why:  Office will call you to arrange your appt (sent)   Contact information:   [address] [place] [address] [phone]        The results of significant diagnostics from this hospitalization (including imaging, microbiology, ancillary and laboratory) are listed below for reference.     Microbiology: No results found for this or any previous visit (from the past 240 hour(s)).   Labs: Basic Metabolic Panel:  Recent Labs Lab 04/03/16 1423  04/06/16 0350 04/07/16 1128 04/08/16 0634 04/09/16 0715 04/10/16 0531  NA  --   < > 135 136 135 135 133*  K  --   < > 3.9 4.4 4.0 3.7 3.7  CL  --   < > 97* 100* 98* 96* 95*  CO2  --   < > 25 21* 25 28 28   GLUCOSE  --   < > 134* 100* 123* 120* 115*  BUN  --   < > 28* 18 25* 16 24*  CREATININE  --    < > 4.45* 3.54* 4.58* 3.78* 4.96*  CALCIUM  --   < > 8.9 8.7* 9.3 8.8* 8.9  MG 2.1  --   --   --   --   --   --   PHOS  --   < > 4.6 3.2 4.0 3.6 4.2  < > = values in this interval not displayed. Liver Function Tests:  Recent Labs Lab 04/06/16 0350 04/07/16 1128 04/08/16 0634 04/09/16 0715 04/10/16 0531  ALBUMIN 2.5* 2.6* 2.6* 2.6* 2.7*   No results for input(s): LIPASE, AMYLASE in the last 168 hours. No results for input(s): AMMONIA in the last 168 hours. CBC:  Recent Labs Lab 04/06/16 0358 04/07/16 1107 04/08/16 0634 04/09/16 0715 04/10/16 0531  WBC 6.7 7.5 7.1 7.6 7.2  HGB 7.1* 8.4* 8.5* 7.9* 8.2*  HCT 22.1* 25.9* 26.1* 25.7* 25.4*  MCV 89.5 89.0 89.4 89.2 88.5  PLT 195 215 225 221 127*   BNP: BNP (last 3 results)  Recent Labs  03/14/16 1457 03/20/16 1227  BNP 1509.6* 1489.8*   CBG:  Recent Labs Lab 04/09/16 0803 04/09/16 1235 04/09/16 1658 04/09/16 2059 04/10/16 0824  GLUCAP 119* 153* 106* 126* 102*     SIGNED: Time coordinating discharge: 30 minutes  MAGICK-Harwood Nall, MD  [facility] 04/10/2016, 10:52 AM Pager [phone]  If 7PM-7AM, please contact night-coverage [email or web address] Password [email or web address]

## 2016-04-10 NOTE — Progress Notes (Signed)
Patient will DC to: [name] Anticipated DC date: 04/10/16 Family notified: Patient calling her family Transport by: Sharin Mons   Per MD patient ready for DC to [facility]. RN, patient, patient's family, and facility notified of DC. RN given number for report. DC packet on chart. Ambulance transport requested for patient.   CSW signing off.  Cristobal Goldmann, [place] Clinical Social Worker [phone]

## 2016-04-10 NOTE — Progress Notes (Signed)
RN called report to [facility] [employer] place. Pt aware. Transferred by ambulance

## 2016-04-10 NOTE — Progress Notes (Addendum)
ANTICOAGULATION CONSULT NOTE - Follow Up Consult  Pharmacy Consult for heparin/coumadin Indication: DVT (March 2017)  Allergies  Allergen Reactions  . Entresto [Sacubitril-Valsartan] Other (See Comments)    On MAR    Patient Measurements: Height:  (162.6 cm) Weight: 230 lb 13.2 oz (104.7 kg) IBW/kg (Calculated) : 54.7 Heparin Dosing Weight: 79 kg  Vital Signs: Temp: 97.9 F (36.6 C) (05/15 0826) Temp Source: Oral (05/15 0826) BP: 119/62 mmHg (05/15 0826) Pulse Rate: 75 (05/15 0826)  Labs:  Recent Labs  04/08/16 0634 04/09/16 0715 04/09/16 1700 04/10/16 0531  HGB 8.5* 7.9*  --  8.2*  HCT 26.1* 25.7*  --  25.4*  PLT 225 221  --  127*  LABPROT 18.3* 18.8*  --  20.0*  INR 1.51* 1.57*  --  1.71*  HEPARINUNFRC 0.61 0.73* 0.69 0.41  CREATININE 4.58* 3.78*  --  4.96*    Estimated Creatinine Clearance: 13.3 mL/min (by C-G formula based on Cr of 4.96).   Medications:  Prescriptions prior to admission  Medication Sig Dispense Refill Last Dose  . acetaminophen (TYLENOL) 325 MG tablet Take 650 mg by mouth every 6 (six) hours as needed for mild pain.   Past Week at Unknown time  . albuterol (PROVENTIL HFA;VENTOLIN HFA) 108 (90 Base) MCG/ACT inhaler Inhale 2 puffs into the lungs every 6 (six) hours as needed for wheezing or shortness of breath.    rescue at rescue  . atorvastatin (LIPITOR) 10 MG tablet Take 10 mg by mouth daily.   03/19/2016 at Unknown time  . beclomethasone (QVAR) 80 MCG/ACT inhaler Inhale 2 puffs into the lungs 2 (two) times daily.   03/19/2016 at Unknown time  . carvedilol (COREG) 12.5 MG tablet Take 12.5 mg by mouth 2 (two) times daily.   03/19/2016 at 2107  . insulin glargine (LANTUS) 100 UNIT/ML injection Inject 18 Units into the skin at bedtime.   03/19/2016 at Unknown time  . insulin lispro (HUMALOG) 100 UNIT/ML injection Inject 5 Units into the skin 3 (three) times daily with meals. For CBG > 180   03/19/2016 at Unknown time  . LORazepam (ATIVAN) 0.5  MG tablet Take 0.5 mg by mouth 2 (two) times daily. Stop date 03/20/16   03/19/2016 at unk  . omeprazole (PRILOSEC) 20 MG capsule Take 1 capsule (20 mg total) by mouth daily. 30 capsule 0 03/20/2016 at Unknown time  . torsemide (DEMADEX) 100 MG tablet Take 20-100 mg by mouth See admin instructions. Per MAR, pt given  in am,   in afternoon, and  in evening on 4/23   unk at unk  . warfarin (COUMADIN) 2 MG tablet Take 2 mg by mouth daily. Take along with 2.5 mg= 4.5 mg daily   03/19/2016 at 2107  . warfarin (COUMADIN) 2.5 MG tablet Take 2.5 mg by mouth daily. Take along with 2 mg= 4.5 mg daily   03/19/2016 at 2107  . Inulin (FIBER CHOICE) 1.5 g CHEW Chew 2 tablets by mouth daily as needed (for constipation).    unk at unk    Assessment: 65 yo f admitted 03/20/2016 on warfarin PTA for recent RIJ DVT (March 2017). INR on admission was supratherapeutic, warfarin was reversed and then held for HD cath placement. Pharmacy consulted for heparin and then resumed warfarin 5/9.  HL 0.41, INR 1.71, Hgb 8.2, Plt 221 > 127. No s/sx of bleeding noted.  Goal of Therapy:  INR 2-3 Heparin level 0.3-0.7 units/ml Monitor platelets by anticoagulation protocol: Yes   Plan:  -  Continue heparin at 1850 units/hr - Warfarin 7.5 mg x1 tonight - Monitor daily HL, INR, CBC and s/sx of bleeding -- watch PLTC  Rhylie Stehr, Pharm.D., BCPS Clinical Pharmacist Pager [phone] 04/10/2016 10:16 AM   Addendum: Noted plans for patient discharge.  MD would like to transition patient to Lovenox dosing for bridging with Coumadin while INR subtherapeutic.  Based on patients renal function, would recommend once daily Lovenox dose of 1mg /kg q24h.  Dose can be given at time of heparin gtt discontinuation.    Would recommend outpt Rx for Lovenox 100mg  SQ q24h x 2 doses at discharge.  This should be adequate coverage until INR therapeutic.  Also recommend Coumadin 7.5mg  PO daily at discharge.  Next INR check Weds or Thurs  of this week.  [employer], Pharm.D., BCPS Clinical Pharmacist Pager [phone] 04/10/2016 12:12 PM

## 2016-04-11 ENCOUNTER — Other Ambulatory Visit: Payer: Self-pay | Admitting: Nephrology

## 2016-04-12 ENCOUNTER — Other Ambulatory Visit: Payer: Self-pay

## 2016-04-12 LAB — POCT INR: INR: 2.4 — AB (ref 0.9–1.1)

## 2016-04-12 MED ORDER — LORAZEPAM 0.5 MG PO TABS: 0.5000 mg | ORAL_TABLET | Freq: Two times a day (BID) | ORAL | Status: DC

## 2016-04-12 NOTE — Telephone Encounter (Signed)
Rx faxed to [facility] @ [phone], phone number [phone]  

## 2016-04-14 ENCOUNTER — Encounter: Payer: Self-pay | Admitting: Adult Health

## 2016-04-14 ENCOUNTER — Non-Acute Institutional Stay (SKILLED_NURSING_FACILITY): Payer: Medicare PPO | Admitting: Adult Health

## 2016-04-14 DIAGNOSIS — J449 Chronic obstructive pulmonary disease, unspecified: Secondary | ICD-10-CM

## 2016-04-14 DIAGNOSIS — N184 Chronic kidney disease, stage 4 (severe): Secondary | ICD-10-CM | POA: Diagnosis not present

## 2016-04-14 DIAGNOSIS — E1122 Type 2 diabetes mellitus with diabetic chronic kidney disease: Secondary | ICD-10-CM | POA: Diagnosis not present

## 2016-04-14 DIAGNOSIS — I82401 Acute embolism and thrombosis of unspecified deep veins of right lower extremity: Secondary | ICD-10-CM | POA: Diagnosis not present

## 2016-04-14 DIAGNOSIS — A047 Enterocolitis due to Clostridium difficile: Secondary | ICD-10-CM | POA: Diagnosis not present

## 2016-04-14 DIAGNOSIS — D631 Anemia in chronic kidney disease: Secondary | ICD-10-CM | POA: Insufficient documentation

## 2016-04-14 DIAGNOSIS — N186 End stage renal disease: Secondary | ICD-10-CM

## 2016-04-14 DIAGNOSIS — I5022 Chronic systolic (congestive) heart failure: Secondary | ICD-10-CM | POA: Diagnosis not present

## 2016-04-14 DIAGNOSIS — A0472 Enterocolitis due to Clostridium difficile, not specified as recurrent: Secondary | ICD-10-CM

## 2016-04-14 DIAGNOSIS — N185 Chronic kidney disease, stage 5: Secondary | ICD-10-CM

## 2016-04-14 NOTE — Progress Notes (Signed)
Location:  [facility] Room Number: 1205 P Place of Service:  SNF (31)   CODE STATUS: FULL CODE  Allergies  Allergen Reactions  . Entresto [Sacubitril-Valsartan] Other (See Comments)    On [facility]    Chief Complaint  Patient presents with  . Hospitalization Follow-up    HPI:  She has been hospitalized for acute on chronic heart failure; new dialysis; and positive c-diff. She is here for short term rehab with her goal to return back home.  She is having diarrheal stools with 4 today. She has recently completed treatment with vancomycin for c-diff. She does have stomach pain. She tells me that she has been hospitalized several times over the past 6 months.   Past Medical History  Diagnosis Date  . Coronary artery disease   . CHF (congestive heart failure) (HCC)   . COPD (chronic obstructive pulmonary disease) (HCC)   . Hypertension     Past Surgical History  Procedure Laterality Date  . Insertion of dialysis catheter Left 04/03/2016    Procedure: INSERTION OF DIALYSIS CATHETER;  Surgeon: Larina Earthly, MD;  Location: [facility] OR;  Service: Vascular;  Laterality: Left;  . Av fistula placement Left 04/03/2016    Procedure: ARTERIOVENOUS (AV) FISTULA CREATION;  Surgeon: Larina Earthly, MD;  Location: [facility] OR;  Service: Vascular;  Laterality: Left;    Social History   Social History  . Marital Status: Single    Spouse Name: N/A  . Number of Children: N/A  . Years of Education: N/A   Occupational History  . Not on file.   Social History Main Topics  . Smoking status: Former Smoker    Types: Cigarettes    Quit date: 03/03/2006  . Smokeless tobacco: Never Used  . Alcohol Use: No  . Drug Use: No  . Sexual Activity: No   Other Topics Concern  . Not on file   Social History Narrative   History reviewed. No pertinent family history.    VITAL SIGNS BP 108/63 mmHg  Pulse 81  Temp(Src) 97.2 F (36.2 C) (Oral)  Resp 16  Ht 5\' 8"  (1.727 m)  Wt  230 lb (104.327 kg)  BMI 34.98 kg/m2  SpO2 94%  Patient's Medications  New Prescriptions   No medications on file  Previous Medications   ACETAMINOPHEN (TYLENOL) 325 MG TABLET    Take 650 mg by mouth every 6 (six) hours as needed for mild pain.   ALBUTEROL (PROVENTIL HFA;VENTOLIN HFA) 108 (90 BASE) MCG/ACT INHALER    Inhale 2 puffs into the lungs every 6 (six) hours as needed for wheezing or shortness of breath.    ALBUTEROL (PROVENTIL) (2.5 MG/3ML) 0.083% NEBULIZER SOLUTION    Take 3 mLs (2.5 mg total) by nebulization every 6 (six) hours as needed for wheezing or shortness of breath.   ATORVASTATIN (LIPITOR) 10 MG TABLET    Take 10 mg by mouth daily.   BUDESONIDE (PULMICORT) 0.5 MG/2ML NEBULIZER SOLUTION    Take 2 mLs (0.5 mg total) by nebulization 2 (two) times daily.   CARVEDILOL (COREG) 3.125 MG TABLET    Take 1 tablet (3.125 mg total) by mouth 2 (two) times daily with a meal.   ENOXAPARIN (LOVENOX) 100 MG/ML INJECTION    Inject 1 mL (100 mg total) into the skin daily.   FAMOTIDINE (PEPCID) 20 MG TABLET    Take 1 tablet (20 mg total) by mouth daily.   INSULIN GLARGINE (LANTUS) 100 UNIT/ML  INJECTION    Inject 18 Units into the skin at bedtime.   INSULIN LISPRO (HUMALOG) 100 UNIT/ML INJECTION    Inject 5 Units into the skin 3 (three) times daily with meals. For CBG > 180   INULIN (FIBER CHOICE) 1.5 G CHEW    Chew 2 tablets by mouth daily as needed (for constipation).    MULTIVITAMIN (RENA-VIT) TABS TABLET    Take 1 tablet by mouth at bedtime.   OMEPRAZOLE (PRILOSEC) 20 MG CAPSULE    Take 1 capsule (20 mg total) by mouth daily.   WARFARIN (COUMADIN) 7.5 MG TABLET    Take 1 tablet (7.5 mg total) by mouth daily at 6 PM.  Modified Medications   No medications on file  Discontinued Medications   No medications on file     SIGNIFICANT DIAGNOSTIC EXAMS  03-21-16: 2-d echo: - Left ventricle: The cavity size was mildly dilated. Wall thickness was normal. Systolic function was moderately  reduced. The estimated ejection fraction was in the range of 35% to 40%. Diffuse hypokinesis. There is akinesis of the inferolateral and inferior myocardium. Features are consistent with a pseudonormal left ventricular filling pattern, with concomitant abnormal relaxation and increased filling pressure (grade 2 diastolic dysfunction). - Ascending aorta: The ascending aorta was mildly dilated. - Mitral valve: Calcified annulus. Mildly thickened leaflets . There was mild regurgitation. - Left atrium: The atrium was mildly dilated. - Right ventricle: The cavity size was moderately dilated. - Right atrium: The atrium was moderately dilated. - Atrial septum: The septum bowed from right to left, consistent with increased right atrial pressure. - Tricuspid valve: There was severe regurgitation. - Pulmonary arteries: PA peak pressure: 38 mm Hg (S). - Pericardium, extracardiac: A small pericardial effusion was identified. Impressions: - Global hypokinesis with inferior and inferior lateral akinesis  overall moderately reduced LV function; grade 2 diastolic dysfunction; mild LAE; moderate RAE/RVE; small pericardial effusion; mild MR; severe TR.  03-21-16: chest x-ray: Center venous catheter tip projects over the superior vena cava. Marked cardiomegaly  03-22-16: renal ultrasound: 1. No hydronephrosis. No renal mass. Bilateral renal mild increased echogenicity suspicious for medical renal disease. Limited visualization of the left kidney due to bowel gas.  03-23-16: chest x-ray: Stable chest with persistent cardiomegaly and vascular congestion.    LABS REVIEWED:   03-16-16: wbc 11,3l hgb 9.1; hct 27.8; mcv 87.1; plt 135; glucose 59; bun 59; creat 3.30; k+ 3.4; na++ 138; liver normal albumin 2.7; chol 49; ldl 21; trig 68; hdl 14.2 hgb a1c 6.4 03-20-16: wbc 7.5;hgb 9.0; hct 28.4; mcv 85.5; plt 165; glucose 77; bun 68; creat 4.44; k+ 3.4;na++139; BNP 1489.8 03-21-16; stool c-diff: + 04-04-16: wbc 7.7; hgb 8.4;  hct 26.7;mcv 90.8; plt 180; glucose 141; bun 41; creat 4.26; k+ 4.4; na++ 138; phos 5.4; albumin 2.6; iron 31;tibc 328; ferritin 181; PTH 77 04-10-16: wbc 7.2; hgb 8.2; hcr 25.4; mcv 88.;5 plt 127; glucose 115; bun 24; creat 3.7; na++133; phos 4.2; albumin 2.7    Review of Systems  Constitutional: Negative for malaise/fatigue.  Respiratory: Negative for cough and shortness of breath.   Cardiovascular: Negative for chest pain, palpitations and leg swelling.  Gastrointestinal: Positive for abdominal pain and diarrhea. Negative for heartburn.  Musculoskeletal: Negative for myalgias, back pain and joint pain.  Skin: Negative.   Neurological: Negative for dizziness.  Psychiatric/Behavioral: The patient is not nervous/anxious.      Physical Exam  Constitutional: She is oriented to person, place, and time. No distress.  Obese  Eyes: Conjunctivae are normal.  Neck: Neck supple. No JVD present. No thyromegaly present.  Cardiovascular: Normal rate, regular rhythm and intact distal pulses.   Respiratory: Effort normal and breath sounds normal. No respiratory distress. She has no wheezes.  GI: Soft. Bowel sounds are normal. She exhibits no distension. There is no tenderness.  Musculoskeletal: She exhibits no edema.  Able to move all extremities   Lymphadenopathy:    She has no cervical adenopathy.  Neurological: She is alert and oriented to person, place, and time.  Skin: Skin is warm and dry. She is not diaphoretic.  Left groin dialysis access Left upper extremity a/v shunt +thill +bruit   Psychiatric: She has a normal mood and affect.     ASSESSMENT/ PLAN:  1. Diarrhea in patient with history of c-diff: specimen has collected. Will begin vancomycin 125 mg every 6 hours for 14 days; will begin florastor twice daily as probiotic. Will monitor her status.   2. Dyslipidemia: will continue lipitor 10 mg daily ldl is 21  3. Diabetes: hgb a1c is 6.4; will continue humalog 5 units with meals  for cbg >=180 will continue lantus 18 units daily  will monitor  4. Gerd: will continue pepcid 20 mg daily will stop the prilosec due her c-diff  5.  COPD: will continue pulmicort neb treatment twice daily will continue albuterol 2 puffs or neb treatment every 6 hours as needed  6. Chronic systolic heart failure: EF 35-40% will continue coreg 3.125 mg twice daily will monitor  7. ESRD on hemodialysis: is on hemodialysis three days weekly; will continue renavite; will monitor  8. Anemia of chronic disease: hgb is 8.2. Receives aranesp at dialysis.   9. Hypertension: will continue coreg 3.125 mg twice daily  10. Right DVT: is on coumadin therapy and lovenol therapy until inr is therapeutic. Will monitor   Will check cbc; cmp next blood draw     Time spent with patient  50  minutes >50% time spent counseling; reviewing medical record; tests; labs; and developing future plan of care   Synthia Innocent NP [facility]  Contact [phone] Monday through Friday 8am- 5pm  After hours call [phone]

## 2016-04-17 ENCOUNTER — Encounter: Payer: Self-pay | Admitting: Internal Medicine

## 2016-04-17 ENCOUNTER — Non-Acute Institutional Stay (SKILLED_NURSING_FACILITY): Payer: Medicare PPO | Admitting: Internal Medicine

## 2016-04-17 ENCOUNTER — Telehealth: Payer: Self-pay | Admitting: Vascular Surgery

## 2016-04-17 DIAGNOSIS — R5381 Other malaise: Secondary | ICD-10-CM | POA: Diagnosis not present

## 2016-04-17 DIAGNOSIS — A047 Enterocolitis due to Clostridium difficile: Secondary | ICD-10-CM | POA: Diagnosis not present

## 2016-04-17 DIAGNOSIS — I82409 Acute embolism and thrombosis of unspecified deep veins of unspecified lower extremity: Secondary | ICD-10-CM | POA: Diagnosis not present

## 2016-04-17 DIAGNOSIS — E785 Hyperlipidemia, unspecified: Secondary | ICD-10-CM

## 2016-04-17 DIAGNOSIS — R791 Abnormal coagulation profile: Secondary | ICD-10-CM

## 2016-04-17 DIAGNOSIS — J449 Chronic obstructive pulmonary disease, unspecified: Secondary | ICD-10-CM

## 2016-04-17 DIAGNOSIS — E119 Type 2 diabetes mellitus without complications: Secondary | ICD-10-CM

## 2016-04-17 DIAGNOSIS — E1169 Type 2 diabetes mellitus with other specified complication: Secondary | ICD-10-CM

## 2016-04-17 DIAGNOSIS — D696 Thrombocytopenia, unspecified: Secondary | ICD-10-CM

## 2016-04-17 DIAGNOSIS — E871 Hypo-osmolality and hyponatremia: Secondary | ICD-10-CM

## 2016-04-17 DIAGNOSIS — E46 Unspecified protein-calorie malnutrition: Secondary | ICD-10-CM

## 2016-04-17 DIAGNOSIS — K219 Gastro-esophageal reflux disease without esophagitis: Secondary | ICD-10-CM

## 2016-04-17 DIAGNOSIS — A0472 Enterocolitis due to Clostridium difficile, not specified as recurrent: Secondary | ICD-10-CM

## 2016-04-17 DIAGNOSIS — Z992 Dependence on renal dialysis: Secondary | ICD-10-CM

## 2016-04-17 DIAGNOSIS — I10 Essential (primary) hypertension: Secondary | ICD-10-CM

## 2016-04-17 DIAGNOSIS — N186 End stage renal disease: Secondary | ICD-10-CM | POA: Diagnosis not present

## 2016-04-17 DIAGNOSIS — D638 Anemia in other chronic diseases classified elsewhere: Secondary | ICD-10-CM

## 2016-04-17 DIAGNOSIS — I5022 Chronic systolic (congestive) heart failure: Secondary | ICD-10-CM | POA: Diagnosis not present

## 2016-04-17 DIAGNOSIS — E669 Obesity, unspecified: Secondary | ICD-10-CM

## 2016-04-17 LAB — CBC AND DIFFERENTIAL
HEMATOCRIT: 28 % — AB (ref 36–46)
HEMOGLOBIN: 8.7 g/dL — AB (ref 12.0–16.0)
PLATELETS: 200 10*3/uL (ref 150–399)
WBC: 6.7 10*3/mL

## 2016-04-17 LAB — HEPATIC FUNCTION PANEL
ALT: 6 U/L — AB (ref 7–35)
AST: 12 U/L — AB (ref 13–35)
Alkaline Phosphatase: 164 U/L — AB (ref 25–125)
Bilirubin, Total: 1.2 mg/dL

## 2016-04-17 LAB — BASIC METABOLIC PANEL
BUN: 29 mg/dL — AB (ref 4–21)
Creatinine: 6.6 mg/dL — AB (ref 0.5–1.1)
GLUCOSE: 105 mg/dL
Potassium: 3.8 mmol/L (ref 3.4–5.3)
Sodium: 141 mmol/L (ref 137–147)

## 2016-04-17 NOTE — Progress Notes (Signed)
LOCATION: [facility]  PCP: [name], MD   Code Status: Full Code  Goals of care: Advanced Directive information Advanced Directives 04/14/2016  Does patient have an advance directive? No  Type of Advance Directive -  Does patient want to make changes to advanced directive? -  Copy of advanced directive(s) in chart? -       Extended Emergency Contact Information Primary Emergency Contact: Haith,[name] Address: [address]          [place], [place] [address] [place] of [place] Home Phone: [phone] Relation: Sister Secondary Emergency Contact: Marcello Moores, [place] Home Phone: [phone] Relation: Sister   Allergies  Allergen Reactions  . Entresto [Sacubitril-Valsartan] Other (See Comments)    On [facility]    Chief Complaint  Patient presents with  . Readmit To SNF    Readmission     HPI:  Patient is a 65 y.o. female seen today for short term rehabilitation post hospital re-admission from 03/20/16-04/10/16 with c.diff colitis, ESRD and acute on chronic systolic CHF. She was started on po vancomycin and her loose stool had resolved at time of discharge. She was started on hemodialysis and had LUE AV fistula placed on 04/03/16 along with temporary left groin cath on 04/03/16. She received blood transfusion and aranesp for her anemia. She is seen in her room today. She started having loose stools on arrival to the facility. Her stool has been sent for c.diff PCR and she is now on po vancomycin. She had blood in her stool on saturday and none following that.   Review of Systems:  Constitutional: Negative for fever, chills, diaphoresis. Energy level is slowly returning.  HENT: Negative for congestion, nasal discharge, hearing loss, sore throat, difficulty swallowing. Positive for occasional headaches.  Eyes: Negative for blurred vision, double vision and discharge.  Respiratory: Negative for cough and wheezing. Positive for  shortness of breath with exertion.   Cardiovascular: Negative for chest pain, palpitations, leg swelling.  Gastrointestinal: Negative for heartburn, nausea, vomiting, abdominal pain, loss of appetite. Positive for diarrhea. 4-5 loose stools yesterday. Blood in stool Saturday but none after that. Heartburn medications are helping.  Genitourinary: Negative for dysuria and flank pain.  Musculoskeletal: Negative for back pain, fall in the facility.  Skin: Negative for itching, rash.  Neurological: Positive for dizziness with change of position. Psychiatric/Behavioral: Negative for depression. Positive for anxiety.    Past Medical History  Diagnosis Date  . Coronary artery disease   . CHF (congestive heart failure) (HCC)   . COPD (chronic obstructive pulmonary disease) (HCC)   . Hypertension    Past Surgical History  Procedure Laterality Date  . Insertion of dialysis catheter Left 04/03/2016    Procedure: INSERTION OF DIALYSIS CATHETER;  Surgeon: Larina Earthly, MD;  Location: [facility] OR;  Service: Vascular;  Laterality: Left;  . Av fistula placement Left 04/03/2016    Procedure: ARTERIOVENOUS (AV) FISTULA CREATION;  Surgeon: Larina Earthly, MD;  Location: [facility] OR;  Service: Vascular;  Laterality: Left;   Social History:   reports that she quit smoking about 10 years ago. Her smoking use included Cigarettes. She has never used smokeless tobacco. She reports that she does not drink alcohol or use illicit drugs.  No family history on file.  Medications:   Medication List       This list is accurate as of: 04/17/16 12:22 PM.  Always use your most  recent med list.               acetaminophen 325 MG tablet  Commonly known as:  TYLENOL  Take 650 mg by mouth every 6 (six) hours as needed for mild pain.     albuterol 108 (90 Base) MCG/ACT inhaler  Commonly known as:  PROVENTIL HFA;VENTOLIN HFA  Inhale 2 puffs into the lungs every 6 (six) hours as needed for wheezing or shortness of breath.      albuterol 108 (90 Base) MCG/ACT inhaler  Commonly known as:  PROVENTIL HFA;VENTOLIN HFA  Inhale 2 puffs into the lungs 4 (four) times daily as needed for wheezing or shortness of breath.     albuterol (2.5 MG/3ML) 0.083% nebulizer solution  Commonly known as:  PROVENTIL  Take 3 mLs (2.5 mg total) by nebulization every 6 (six) hours as needed for wheezing or shortness of breath.     atorvastatin 10 MG tablet  Commonly known as:  LIPITOR  Take 10 mg by mouth daily.     budesonide 0.5 MG/2ML nebulizer solution  Commonly known as:  PULMICORT  Take 2 mLs (0.5 mg total) by nebulization 2 (two) times daily.     carvedilol 3.125 MG tablet  Commonly known as:  COREG  Take 1 tablet (3.125 mg total) by mouth 2 (two) times daily with a meal.     enoxaparin 100 MG/ML injection  Commonly known as:  LOVENOX  Inject 1 mL (100 mg total) into the skin daily.     famotidine 20 MG tablet  Commonly known as:  PEPCID  Take 1 tablet (20 mg total) by mouth daily.     FIBER CHOICE 1.5 g Chew  Generic drug:  Inulin  Chew 2 tablets by mouth daily as needed (for constipation).     insulin glargine 100 UNIT/ML injection  Commonly known as:  LANTUS  Inject 18 Units into the skin at bedtime.     insulin lispro 100 UNIT/ML injection  Commonly known as:  HUMALOG  Inject 5 Units into the skin 3 (three) times daily with meals. For CBG > 180     LORazepam 0.5 MG tablet  Commonly known as:  ATIVAN  Take 0.5 mg by mouth 2 (two) times daily as needed for anxiety.     multivitamin Tabs tablet  Take 1 tablet by mouth at bedtime.     OXYGEN  Inhale 2 L into the lungs at bedtime.     saccharomyces boulardii 250 MG capsule  Commonly known as:  FLORASTOR  Take 250 mg by mouth 2 (two) times daily.     vancomycin 125 MG capsule  Commonly known as:  VANCOCIN  Take 125 mg by mouth every 6 (six) hours. Stop date 04/28/16     warfarin 7.5 MG tablet  Commonly known as:  COUMADIN  Take 1 tablet (7.5 mg total)  by mouth daily at 6 PM.        Immunizations: Immunization History  Administered Date(s) Administered  . PPD Test 03/01/2016, 04/10/2016     Physical Exam: Filed Vitals:   04/17/16 1158  BP: 102/60  Pulse: 73  Temp: 98.9 F (37.2 C)  TempSrc: Oral  Resp: 18  Height: 5\' 8"  (1.727 m)  Weight: 226 lb 9.6 oz (102.785 kg)  SpO2: 95%   Body mass index is 34.46 kg/(m^2).  General- elderly female, obese, in no acute distress Head- normocephalic, atraumatic Nose-  no maxillary or frontal sinus tenderness, no nasal discharge Throat- moist  mucus membrane, upper dentures, poor lower dentition Eyes- PERRLA, EOMI, no pallor, no icterus, no discharge, normal conjunctiva, normal sclera Neck- no cervical lymphadenopathy Cardiovascular- normal s1,s2, no murmur, 1+ leg edema Respiratory- bilateral poor air entry, no wheeze, no rhonchi, no crackles, no use of accessory muscles Abdomen- bowel sounds present, soft, non tender Musculoskeletal- able to move all 4 extremities, generalized weakness Neurological- alert and oriented to person, place and time Skin- warm and dry, AV fistula to LUE, dialysis catheter to left groin area, dressing to right groin  Psychiatry- normal mood and affect   Labs reviewed: Basic Metabolic Panel:  Recent Labs  16/10/96 0405 03/29/16 0410  04/03/16 1423  04/08/16 0634 04/09/16 0715 04/10/16 0531  NA 138  138 137  < >  --   < > 135 135 133*  K 3.3*  3.3* 3.5  < >  --   < > 4.0 3.7 3.7  CL 102  102 102  < >  --   < > 98* 96* 95*  CO2 27  27 27   < >  --   < > 25 28 28   GLUCOSE 176*  179* 238*  < >  --   < > 123* 120* 115*  BUN 32*  32* 26*  < >  --   < > 25* 16 24*  CREATININE 3.19*  3.18* 2.66*  < >  --   < > 4.58* 3.78* 4.96*  CALCIUM 8.4*  8.4* 8.0*  < >  --   < > 9.3 8.8* 8.9  MG 2.3 2.2  --  2.1  --   --   --   --   PHOS 2.7  2.7 2.7  < >  --   < > 4.0 3.6 4.2  < > = values in this interval not displayed. Liver Function  Tests:  Recent Labs  03/04/16 03/17/16 03/21/16 0807  04/08/16 0634 04/09/16 0715 04/10/16 0531  AST 43* 11* 22  --   --   --   --   ALT 15 8 11*  --   --   --   --   ALKPHOS 207* 130* 110  --   --   --   --   BILITOT 2.3*  --  1.5*  --   --   --   --   PROT 6.9  --  5.7*  --   --   --   --   ALBUMIN 2.9*  --  2.1*  < > 2.6* 2.6* 2.7*  < > = values in this interval not displayed.  Recent Labs  03/04/16  LIPASE 44   No results for input(s): AMMONIA in the last 8760 hours. CBC:  Recent Labs  03/04/16 03/14/16 1457  03/20/16 1227  04/08/16 0634 04/09/16 0715 04/10/16 0531  WBC 7.5 13.0*  < > 7.5  < > 7.1 7.6 7.2  NEUTROABS 5.7 11.0*  --  5.7  --   --   --   --   HGB 8.7* 9.3*  < > 9.0*  < > 8.5* 7.9* 8.2*  HCT 28.4* 30.2*  < > 28.4*  < > 26.1* 25.7* 25.4*  MCV 87.9 89.3  --  85.5  < > 89.4 89.2 88.5  PLT 298 136*  < > 165  < > 225 221 127*  < > = values in this interval not displayed. Cardiac Enzymes: No results for input(s): CKTOTAL, CKMB, CKMBINDEX, TROPONINI in the last 8760 hours. BNP: Invalid  input(s): POCBNP CBG:  Recent Labs  04/09/16 2059 04/10/16 0824 04/10/16 1155  GLUCAP 126* 102* 106*    Radiological Exams: Dg Chest 2 View  03/20/2016  CLINICAL DATA:  COPD exacerbation with diarrhea for 1.5 weeks. History of recent acute renal failure with temporary dialysis. Additional history of coronary artery disease, congestive heart failure, COPD and hypertension. EXAM: CHEST  2 VIEW COMPARISON:  03/14/2016 and 03/04/2016. FINDINGS: The left subclavian AICD lead appears unchanged. There is stable cardiomegaly, bilateral pleural effusions and mild bibasilar atelectasis. No edema, confluent airspace opacity or pneumothorax. The bones appear unchanged. IMPRESSION: Stable cardiomegaly, small pleural effusions and bibasilar atelectasis. No acute findings. Electronically Signed   By: Carey Bullocks M.D.   On: 03/20/2016 13:22   US Renal  03/22/2016  CLINICAL DATA:   Acute kidney insufficiency, chronic kidney disease EXAM: RENAL / URINARY TRACT ULTRASOUND COMPLETE COMPARISON:  None. FINDINGS: Right Kidney: Length: 13.2 cm. Mild increased cortical echogenicity suspicious for medical renal disease. No hydronephrosis or renal mass. No renal calculi. Left Kidney: Length: 14 cm in length. Subtle mild increased echogenicity No mass or hydronephrosis visualized. Bladder: Not visualized. Limited visualization of the left kidney due to bowel gas. IMPRESSION: 1. No hydronephrosis. No renal mass. Bilateral renal mild increased echogenicity suspicious for medical renal disease. Limited visualization of the left kidney due to bowel gas. Electronically Signed   By: Natasha Mead M.D.   On: 03/22/2016 10:15   Dg Chest Port 1 View  03/23/2016  CLINICAL DATA:  Respiratory failure. EXAM: PORTABLE CHEST 1 VIEW COMPARISON:  03/21/2016 and 03/20/2016. FINDINGS: 0557 hours. Patient is mildly rotated to the right. The left IJ central venous catheter is unchanged at the mid SVC level. The left subclavian AICD appears unchanged. There is stable cardiac enlargement. The lungs appear unchanged with persistent vascular congestion, but no overt pulmonary edema. There is no pleural effusion or pneumothorax. IMPRESSION: Stable chest with persistent cardiomegaly and vascular congestion. Electronically Signed   By: Carey Bullocks M.D.   On: 03/23/2016 07:07   Dg Chest Port 1 View  03/21/2016  CLINICAL DATA:  Patient status post central line placement. EXAM: PORTABLE CHEST 1 VIEW COMPARISON:  Chest radiograph 03/20/2016. FINDINGS: Single lead AICD device overlies the left hemi thorax, lead is stable in position. Interval insertion central venous catheter with tip projecting over the superior vena cava. Stable marked cardiomegaly. Pulmonary vascular redistribution. No large area of pulmonary consolidation. No pleural effusion or pneumothorax. IMPRESSION: Center venous catheter tip projects over the superior  vena cava. Marked cardiomegaly. Electronically Signed   By: Annia Belt M.D.   On: 03/21/2016 20:48   Dg Abd Portable 1v  03/23/2016  CLINICAL DATA:  Bloody diarrhea, colitis, hemodialysis dependent. EXAM: PORTABLE ABDOMEN - 1 VIEW COMPARISON:  None available FINDINGS: Scattered air and stool throughout the bowel. No significant obstruction or dilatation. Mild gastric distention. Right femoral dialysis catheter noted. Degenerative changes of the lower lumbar spine. IMPRESSION: No significant obstruction pattern or ileus. Electronically Signed   By: Judie Petit.  Shick M.D.   On: 03/23/2016 11:30    Assessment/Plan  Physical deconditioning Will have her work with physical therapy and occupational therapy team to help with gait training and muscle strengthening exercises.fall precautions. Skin care. Encourage to be out of bed.   C.diff colitis Continue and complete course of po vancomycin 125 mg qid for total of 2 weeks. Maintain hydration and continue probiotic  CHF With recent chf exacerbation. Monitor weight. Continue coreg bid.  ESRD  On HD 3 days a week  Hyponatremia Monitor bmp  Protein calorie malnutrition Get dietary consult, monitor weight  Anemia of chronic disease S/p PRBC transfusion and arniesp. Continue arniesp at dialysis  Thrombocytopenia Likely in setting of acute illness. No bleed reported. Monitor platelet count  supratherapeutic inr 4.2 today. D/c lovenox. Continue to hold coumadin. Check inr 04/18/16  Hyperlipidemia continue lipitor 10 mg daily  DVT Currently on lovenox and coumadin has been on hold since 03/16/16. inr 4.2 today. D/c lovenox. No bleed reported today. Check inr 04/18/16. Check cbc in am and monitor for bleed  GERD Continue pepcid for now and monitor. Off PPI with c.diff  COPD Breathing is stable. Continue breathing treatment  Diabetes Monitor cbg. continue lantus 18 u daily and humalog 5 units with meals for cbg >=180  Lab Results  Component  Value Date   HGBA1C 6.4 03/17/2016   Hypertension Stable but soft BP reading. Monitor BP daily for a week. continue coreg 3.125 mg twice daily   Goals of care: short term rehabilitation   Labs/tests ordered: cbc, cmp, inr  Family/ staff Communication: reviewed care plan with patient and nursing supervisor    [name], MD Internal Medicine [facility] [address] [place], [place] [address] Cell Phone (Monday-Friday 8 am - 5 pm): [phone] On Call: [phone] and follow prompts after 5 pm and on weekends Office Phone: [phone] Office Fax: [phone]

## 2016-04-17 NOTE — Telephone Encounter (Signed)
-----   Message from Phillips Odor, RN sent at 04/04/2016  9:32 AM EDT ----- Regarding: needs 4-6 week f/u with Dr. Arbie Cookey with duplex of left arm access   ----- Message -----    From: Lars Mage, PA-C    Sent: 04/04/2016   7:55 AM      To: [facility] Charge Pool  Need f/u in 4-6 weeks with duplex of left av fistula Dr. Arbie Cookey

## 2016-04-17 NOTE — Telephone Encounter (Signed)
Ph# not working, mailed appt letters to inform pt of appts: lab on 6/19 at 3 and MD on 6/20 at 8:30. Reshme from [facility] called and confirmed appt.

## 2016-04-18 ENCOUNTER — Encounter: Payer: Self-pay | Admitting: Internal Medicine

## 2016-05-06 ENCOUNTER — Inpatient Hospital Stay (HOSPITAL_COMMUNITY)
Admission: EM | Admit: 2016-05-06 | Discharge: 2016-05-12 | DRG: 371 | Disposition: A | Discharge status: Skilled Nursing Facility | Payer: Medicare PPO | Attending: Internal Medicine | Admitting: Internal Medicine

## 2016-05-06 ENCOUNTER — Emergency Department (HOSPITAL_COMMUNITY): Payer: Medicare PPO

## 2016-05-06 ENCOUNTER — Encounter (HOSPITAL_COMMUNITY): Payer: Self-pay | Admitting: Emergency Medicine

## 2016-05-06 DIAGNOSIS — Z7901 Long term (current) use of anticoagulants: Secondary | ICD-10-CM | POA: Diagnosis not present

## 2016-05-06 DIAGNOSIS — E119 Type 2 diabetes mellitus without complications: Secondary | ICD-10-CM

## 2016-05-06 DIAGNOSIS — I10 Essential (primary) hypertension: Secondary | ICD-10-CM | POA: Diagnosis present

## 2016-05-06 DIAGNOSIS — K529 Noninfective gastroenteritis and colitis, unspecified: Secondary | ICD-10-CM

## 2016-05-06 DIAGNOSIS — Z7951 Long term (current) use of inhaled steroids: Secondary | ICD-10-CM

## 2016-05-06 DIAGNOSIS — I5042 Chronic combined systolic (congestive) and diastolic (congestive) heart failure: Secondary | ICD-10-CM | POA: Diagnosis present

## 2016-05-06 DIAGNOSIS — R197 Diarrhea, unspecified: Secondary | ICD-10-CM

## 2016-05-06 DIAGNOSIS — A0472 Enterocolitis due to Clostridium difficile, not specified as recurrent: Secondary | ICD-10-CM

## 2016-05-06 DIAGNOSIS — D631 Anemia in chronic kidney disease: Secondary | ICD-10-CM | POA: Diagnosis present

## 2016-05-06 DIAGNOSIS — N185 Chronic kidney disease, stage 5: Secondary | ICD-10-CM | POA: Diagnosis not present

## 2016-05-06 DIAGNOSIS — N189 Chronic kidney disease, unspecified: Secondary | ICD-10-CM

## 2016-05-06 DIAGNOSIS — I2510 Atherosclerotic heart disease of native coronary artery without angina pectoris: Secondary | ICD-10-CM | POA: Diagnosis present

## 2016-05-06 DIAGNOSIS — E1122 Type 2 diabetes mellitus with diabetic chronic kidney disease: Secondary | ICD-10-CM | POA: Diagnosis present

## 2016-05-06 DIAGNOSIS — E1169 Type 2 diabetes mellitus with other specified complication: Secondary | ICD-10-CM

## 2016-05-06 DIAGNOSIS — A047 Enterocolitis due to Clostridium difficile: Principal | ICD-10-CM | POA: Diagnosis present

## 2016-05-06 DIAGNOSIS — I5022 Chronic systolic (congestive) heart failure: Secondary | ICD-10-CM | POA: Diagnosis present

## 2016-05-06 DIAGNOSIS — R1011 Right upper quadrant pain: Secondary | ICD-10-CM | POA: Diagnosis present

## 2016-05-06 DIAGNOSIS — Z992 Dependence on renal dialysis: Secondary | ICD-10-CM | POA: Diagnosis not present

## 2016-05-06 DIAGNOSIS — N2581 Secondary hyperparathyroidism of renal origin: Secondary | ICD-10-CM | POA: Diagnosis present

## 2016-05-06 DIAGNOSIS — Z87891 Personal history of nicotine dependence: Secondary | ICD-10-CM | POA: Diagnosis not present

## 2016-05-06 DIAGNOSIS — I472 Ventricular tachycardia: Secondary | ICD-10-CM | POA: Diagnosis not present

## 2016-05-06 DIAGNOSIS — J449 Chronic obstructive pulmonary disease, unspecified: Secondary | ICD-10-CM | POA: Diagnosis present

## 2016-05-06 DIAGNOSIS — G4733 Obstructive sleep apnea (adult) (pediatric): Secondary | ICD-10-CM | POA: Diagnosis present

## 2016-05-06 DIAGNOSIS — Z86718 Personal history of other venous thrombosis and embolism: Secondary | ICD-10-CM | POA: Diagnosis not present

## 2016-05-06 DIAGNOSIS — R109 Unspecified abdominal pain: Secondary | ICD-10-CM

## 2016-05-06 DIAGNOSIS — Z95810 Presence of automatic (implantable) cardiac defibrillator: Secondary | ICD-10-CM | POA: Diagnosis present

## 2016-05-06 DIAGNOSIS — Z79899 Other long term (current) drug therapy: Secondary | ICD-10-CM

## 2016-05-06 DIAGNOSIS — Z888 Allergy status to other drugs, medicaments and biological substances status: Secondary | ICD-10-CM

## 2016-05-06 DIAGNOSIS — I82401 Acute embolism and thrombosis of unspecified deep veins of right lower extremity: Secondary | ICD-10-CM | POA: Diagnosis not present

## 2016-05-06 DIAGNOSIS — I132 Hypertensive heart and chronic kidney disease with heart failure and with stage 5 chronic kidney disease, or end stage renal disease: Secondary | ICD-10-CM | POA: Diagnosis present

## 2016-05-06 DIAGNOSIS — E8889 Other specified metabolic disorders: Secondary | ICD-10-CM | POA: Diagnosis present

## 2016-05-06 DIAGNOSIS — Z794 Long term (current) use of insulin: Secondary | ICD-10-CM

## 2016-05-06 DIAGNOSIS — N186 End stage renal disease: Secondary | ICD-10-CM

## 2016-05-06 DIAGNOSIS — N179 Acute kidney failure, unspecified: Secondary | ICD-10-CM

## 2016-05-06 DIAGNOSIS — Z8619 Personal history of other infectious and parasitic diseases: Secondary | ICD-10-CM | POA: Diagnosis not present

## 2016-05-06 DIAGNOSIS — D649 Anemia, unspecified: Secondary | ICD-10-CM | POA: Diagnosis present

## 2016-05-06 DIAGNOSIS — E669 Obesity, unspecified: Secondary | ICD-10-CM

## 2016-05-06 HISTORY — DX: Anemia, unspecified: D64.9

## 2016-05-06 HISTORY — DX: End stage renal disease: N18.6

## 2016-05-06 HISTORY — DX: Type 2 diabetes mellitus without complications: E11.9

## 2016-05-06 HISTORY — DX: Acute embolism and thrombosis of unspecified deep veins of unspecified lower extremity: I82.409

## 2016-05-06 HISTORY — DX: Enterocolitis due to Clostridium difficile, not specified as recurrent: A04.72

## 2016-05-06 HISTORY — DX: Obstructive sleep apnea (adult) (pediatric): G47.33

## 2016-05-06 LAB — COMPREHENSIVE METABOLIC PANEL
ALBUMIN: 2.4 g/dL — AB (ref 3.5–5.0)
ALK PHOS: 180 U/L — AB (ref 38–126)
ALT: 11 U/L — AB (ref 14–54)
ANION GAP: 9 (ref 5–15)
AST: 18 U/L (ref 15–41)
BUN: 44 mg/dL — ABNORMAL HIGH (ref 6–20)
CALCIUM: 8.7 mg/dL — AB (ref 8.9–10.3)
CHLORIDE: 102 mmol/L (ref 101–111)
CO2: 25 mmol/L (ref 22–32)
CREATININE: 8.08 mg/dL — AB (ref 0.44–1.00)
GFR calc Af Amer: 5 mL/min — ABNORMAL LOW (ref >60)
GFR calc non Af Amer: 5 mL/min — ABNORMAL LOW (ref >60)
GLUCOSE: 104 mg/dL — AB (ref 65–99)
Potassium: 3.9 mmol/L (ref 3.5–5.1)
SODIUM: 136 mmol/L (ref 135–145)
Total Bilirubin: 1.5 mg/dL — ABNORMAL HIGH (ref 0.3–1.2)
Total Protein: 7.3 g/dL (ref 6.5–8.1)

## 2016-05-06 LAB — RENAL FUNCTION PANEL
Albumin: 2.3 g/dL — ABNORMAL LOW (ref 3.5–5.0)
Anion gap: 12 (ref 5–15)
BUN: 46 mg/dL — ABNORMAL HIGH (ref 6–20)
CO2: 22 mmol/L (ref 22–32)
Calcium: 8.4 mg/dL — ABNORMAL LOW (ref 8.9–10.3)
Chloride: 101 mmol/L (ref 101–111)
Creatinine, Ser: 8.26 mg/dL — ABNORMAL HIGH (ref 0.44–1.00)
GFR calc Af Amer: 5 mL/min — ABNORMAL LOW (ref >60)
GFR calc non Af Amer: 5 mL/min — ABNORMAL LOW (ref >60)
Glucose, Bld: 82 mg/dL (ref 65–99)
Phosphorus: 4.6 mg/dL (ref 2.5–4.6)
Potassium: 4.1 mmol/L (ref 3.5–5.1)
Sodium: 135 mmol/L (ref 135–145)

## 2016-05-06 LAB — CBC WITH DIFFERENTIAL/PLATELET
Basophils Absolute: 0 10*3/uL (ref 0.0–0.1)
Basophils Relative: 0 %
Eosinophils Absolute: 0.1 10*3/uL (ref 0.0–0.7)
Eosinophils Relative: 2 %
HCT: 29.8 % — ABNORMAL LOW (ref 36.0–46.0)
HEMOGLOBIN: 9.3 g/dL — AB (ref 12.0–15.0)
LYMPHS ABS: 1 10*3/uL (ref 0.7–4.0)
Lymphocytes Relative: 19 %
MCH: 26.6 pg (ref 26.0–34.0)
MCHC: 31.2 g/dL (ref 30.0–36.0)
MCV: 85.4 fL (ref 78.0–100.0)
MONOS PCT: 16 %
Monocytes Absolute: 0.9 10*3/uL (ref 0.1–1.0)
NEUTROS ABS: 3.3 10*3/uL (ref 1.7–7.7)
NEUTROS PCT: 63 %
PLATELETS: 268 10*3/uL (ref 150–400)
RBC: 3.49 MIL/uL — ABNORMAL LOW (ref 3.87–5.11)
RDW: 17.3 % — ABNORMAL HIGH (ref 11.5–15.5)
WBC: 5.3 10*3/uL (ref 4.0–10.5)

## 2016-05-06 LAB — PHOSPHORUS: Phosphorus: 4.5 mg/dL (ref 2.5–4.6)

## 2016-05-06 LAB — PROTIME-INR
INR: 2.66 — AB (ref 0.00–1.49)
Prothrombin Time: 28 seconds — ABNORMAL HIGH (ref 11.6–15.2)

## 2016-05-06 LAB — LIPASE, BLOOD: Lipase: 37 U/L (ref 11–51)

## 2016-05-06 LAB — GLUCOSE, CAPILLARY
GLUCOSE-CAPILLARY: 92 mg/dL (ref 65–99)
GLUCOSE-CAPILLARY: 105 mg/dL — AB (ref 65–99)

## 2016-05-06 LAB — HEPATIC FUNCTION PANEL: Bilirubin, Total: 1.5 mg/dL

## 2016-05-06 MED ORDER — HEPARIN SODIUM (PORCINE) 1000 UNIT/ML DIALYSIS: 3100.0000 [IU] | Freq: Once | INTRAMUSCULAR | Status: DC

## 2016-05-06 MED ORDER — LORAZEPAM 0.5 MG PO TABS
0.5000 mg | ORAL_TABLET | Freq: Two times a day (BID) | ORAL | Status: DC | PRN
  Administered 2016-05-07 – 2016-05-11 (×6): 0.5 mg via ORAL
  Filled 2016-05-06 (×6): qty 1

## 2016-05-06 MED ORDER — SODIUM CHLORIDE 0.9 % IV SOLN: 100.0000 mL | INTRAVENOUS | Status: DC | PRN

## 2016-05-06 MED ORDER — LIDOCAINE HCL (PF) 1 % IJ SOLN: 5.0000 mL | INTRAMUSCULAR | Status: DC | PRN

## 2016-05-06 MED ORDER — SODIUM CHLORIDE 0.9% FLUSH
3.0000 mL | Freq: Two times a day (BID) | INTRAVENOUS | Status: DC
  Administered 2016-05-06 – 2016-05-10 (×4): 3 mL via INTRAVENOUS

## 2016-05-06 MED ORDER — BUDESONIDE 0.5 MG/2ML IN SUSP
0.5000 mg | Freq: Two times a day (BID) | RESPIRATORY_TRACT | Status: DC
  Administered 2016-05-06 – 2016-05-11 (×8): 0.5 mg via RESPIRATORY_TRACT
  Filled 2016-05-06 (×11): qty 2

## 2016-05-06 MED ORDER — ONDANSETRON HCL 4 MG/2ML IJ SOLN: 4.0000 mg | Freq: Four times a day (QID) | INTRAMUSCULAR | Status: DC | PRN

## 2016-05-06 MED ORDER — ACETAMINOPHEN 325 MG PO TABS
650.0000 mg | ORAL_TABLET | Freq: Four times a day (QID) | ORAL | Status: DC | PRN
  Administered 2016-05-07: 650 mg via ORAL
  Filled 2016-05-06: qty 2

## 2016-05-06 MED ORDER — ALBUTEROL SULFATE (2.5 MG/3ML) 0.083% IN NEBU: 2.5000 mg | INHALATION_SOLUTION | Freq: Four times a day (QID) | RESPIRATORY_TRACT | Status: DC | PRN

## 2016-05-06 MED ORDER — SACCHAROMYCES BOULARDII 250 MG PO CAPS
250.0000 mg | ORAL_CAPSULE | Freq: Two times a day (BID) | ORAL | Status: DC
  Administered 2016-05-07 – 2016-05-12 (×10): 250 mg via ORAL
  Filled 2016-05-06 (×12): qty 1

## 2016-05-06 MED ORDER — DIPHENHYDRAMINE HCL 25 MG PO CAPS
25.0000 mg | ORAL_CAPSULE | Freq: Three times a day (TID) | ORAL | Status: DC | PRN
  Administered 2016-05-07 – 2016-05-12 (×8): 25 mg via ORAL
  Filled 2016-05-06 (×8): qty 1

## 2016-05-06 MED ORDER — IOPAMIDOL (ISOVUE-300) INJECTION 61%
INTRAVENOUS | Status: AC
  Filled 2016-05-06: qty 100

## 2016-05-06 MED ORDER — HEPARIN SODIUM (PORCINE) 5000 UNIT/ML IJ SOLN: 5000.0000 [IU] | Freq: Three times a day (TID) | INTRAMUSCULAR | Status: DC

## 2016-05-06 MED ORDER — CARVEDILOL 3.125 MG PO TABS
3.1250 mg | ORAL_TABLET | Freq: Two times a day (BID) | ORAL | Status: DC
  Administered 2016-05-07 – 2016-05-12 (×11): 3.125 mg via ORAL
  Filled 2016-05-06 (×12): qty 1

## 2016-05-06 MED ORDER — ALBUTEROL SULFATE HFA 108 (90 BASE) MCG/ACT IN AERS: 2.0000 | INHALATION_SPRAY | Freq: Four times a day (QID) | RESPIRATORY_TRACT | Status: DC | PRN

## 2016-05-06 MED ORDER — ONDANSETRON HCL 4 MG PO TABS: 4.0000 mg | ORAL_TABLET | Freq: Four times a day (QID) | ORAL | Status: DC | PRN

## 2016-05-06 MED ORDER — WARFARIN SODIUM 6 MG PO TABS
6.0000 mg | ORAL_TABLET | Freq: Every day | ORAL | Status: DC
  Filled 2016-05-06 (×2): qty 1

## 2016-05-06 MED ORDER — SODIUM CHLORIDE 0.9% FLUSH
3.0000 mL | Freq: Two times a day (BID) | INTRAVENOUS | Status: DC
  Administered 2016-05-06 – 2016-05-12 (×11): 3 mL via INTRAVENOUS

## 2016-05-06 MED ORDER — METRONIDAZOLE IN NACL 5-0.79 MG/ML-% IV SOLN: 500.0000 mg | Freq: Once | INTRAVENOUS | Status: DC

## 2016-05-06 MED ORDER — LIDOCAINE-PRILOCAINE 2.5-2.5 % EX CREA: 1.0000 "application " | TOPICAL_CREAM | CUTANEOUS | Status: DC | PRN

## 2016-05-06 MED ORDER — WARFARIN - PHARMACIST DOSING INPATIENT: Freq: Every day | Status: DC

## 2016-05-06 MED ORDER — SODIUM CHLORIDE 0.9 % IV SOLN: 250.0000 mL | INTRAVENOUS | Status: DC | PRN

## 2016-05-06 MED ORDER — INSULIN ASPART 100 UNIT/ML ~~LOC~~ SOLN: 0.0000 [IU] | Freq: Three times a day (TID) | SUBCUTANEOUS | Status: DC

## 2016-05-06 MED ORDER — FAMOTIDINE 20 MG PO TABS
20.0000 mg | ORAL_TABLET | Freq: Every day | ORAL | Status: DC
  Administered 2016-05-07 – 2016-05-12 (×6): 20 mg via ORAL
  Filled 2016-05-06 (×6): qty 1

## 2016-05-06 MED ORDER — ACETAMINOPHEN 650 MG RE SUPP: 650.0000 mg | Freq: Four times a day (QID) | RECTAL | Status: DC | PRN

## 2016-05-06 MED ORDER — RENA-VITE PO TABS
1.0000 | ORAL_TABLET | Freq: Every day | ORAL | Status: DC
Start: 2016-05-06 — End: 2016-05-12
  Administered 2016-05-07 – 2016-05-11 (×5): 1 via ORAL
  Filled 2016-05-06 (×6): qty 1

## 2016-05-06 MED ORDER — ALTEPLASE 2 MG IJ SOLR: 2.0000 mg | Freq: Once | INTRAMUSCULAR | Status: DC | PRN

## 2016-05-06 MED ORDER — SODIUM CHLORIDE 0.9 % IV SOLN
125.0000 mg | Freq: Once | INTRAVENOUS | Status: AC
  Administered 2016-05-07: 125 mg via INTRAVENOUS
  Filled 2016-05-06: qty 10

## 2016-05-06 MED ORDER — HYDROCODONE-ACETAMINOPHEN 5-325 MG PO TABS
1.0000 | ORAL_TABLET | ORAL | Status: DC | PRN
  Administered 2016-05-12: 2 via ORAL
  Filled 2016-05-06: qty 2

## 2016-05-06 MED ORDER — SODIUM CHLORIDE 0.9 % IV SOLN: INTRAVENOUS | Status: AC

## 2016-05-06 MED ORDER — INSULIN GLARGINE 100 UNIT/ML ~~LOC~~ SOLN
10.0000 [IU] | Freq: Every day | SUBCUTANEOUS | Status: DC
  Administered 2016-05-07: 10 [IU] via SUBCUTANEOUS
  Filled 2016-05-06 (×2): qty 0.1

## 2016-05-06 MED ORDER — ATORVASTATIN CALCIUM 10 MG PO TABS
10.0000 mg | ORAL_TABLET | Freq: Every day | ORAL | Status: DC
  Administered 2016-05-07 – 2016-05-12 (×6): 10 mg via ORAL
  Filled 2016-05-06 (×6): qty 1

## 2016-05-06 MED ORDER — PENTAFLUOROPROP-TETRAFLUOROETH EX AERO: 1.0000 "application " | INHALATION_SPRAY | CUTANEOUS | Status: DC | PRN

## 2016-05-06 MED ORDER — HEPARIN SODIUM (PORCINE) 1000 UNIT/ML DIALYSIS: 1000.0000 [IU] | INTRAMUSCULAR | Status: DC | PRN

## 2016-05-06 MED ORDER — SODIUM CHLORIDE 0.9% FLUSH: 3.0000 mL | INTRAVENOUS | Status: DC | PRN

## 2016-05-06 MED ORDER — INSULIN ASPART 100 UNIT/ML ~~LOC~~ SOLN: 0.0000 [IU] | Freq: Every day | SUBCUTANEOUS | Status: DC

## 2016-05-06 NOTE — Progress Notes (Signed)
ANTICOAGULATION CONSULT NOTE - Initial Consult  Pharmacy Consult for warfarin Indication: hx DVT  Allergies  Allergen Reactions  . Entresto [Sacubitril-Valsartan] Other (See Comments)    Not listed on [facility] 05/06/16 - unknown reaction    Patient Measurements:    Vital Signs: Temp: 97.5 F (36.4 C) (06/10 1125) Temp Source: Oral (06/10 1125) BP: 122/72 mmHg (06/10 1545) Pulse Rate: 77 (06/10 1545)  Labs:  Recent Labs  05/06/16 1240  HGB 9.3*  HCT 29.8*  PLT 268  LABPROT 28.0*  INR 2.66*  CREATININE 8.08*    CrCl cannot be calculated (Unknown ideal weight.).   Medical History: Past Medical History  Diagnosis Date  . Coronary artery disease   . CHF (congestive heart failure) (HCC)     systolic  . COPD (chronic obstructive pulmonary disease) (HCC)   . Hypertension   . Renal disorder   . ESRD (end stage renal disease) (HCC)     tues/thurs/sat dialysis  . DVT (deep venous thrombosis) (HCC)     on coumadin  . C. difficile colitis   . Diabetes mellitus (HCC)   . Obstructive sleep apnea   . Anemia    Assessment: 25 yof presented to the ED with diarrhea and RUQ pain. She is on chronic coumadin for history of DVT. INR is therapeutic today.   Home dose: 6mg  daily  Goal of Therapy:  INR 2-3 Monitor platelets by anticoagulation protocol: Yes   Plan:  - Warfarin 6mg  PO daily - Daily INR  Jermell Holeman, Drake Leach 05/06/2016,5:50 PM

## 2016-05-06 NOTE — H&P (Signed)
History and Physical    Brandy Dudley BJS:[id number] DOB: January 28, 1951 DOA: 05/06/2016  PCP: Brandy Grout, MD Patient coming from: facility  Chief Complaint: diarrhea/ruq pain  HPI: Brandy Dudley is a 65 y.o. female with medical history significant of end-stage renal disease on dialysis, anemia, hypertension, chronic systolic heart failure, COPD, C. difficile colitis presents to emergency department from facility with chief complaint persistent diarrhea right upper quadrant pain. Initial evaluation reveals acute on chronic renal failure.  Information is obtained from the patient. She reports being in a facility for the last 3 weeks after being hospitalized for almost a month prior to that with C. difficile colitis. During this time kidney function declined and dialysis was initiated. He was sent to the emergency department from the facility today for persistent diarrhea. In addition she reports dialysis center has not taken her because she continues to have diarrhea. She reports 3-4 loose stools daily. 2 days ago she developed right upper quadrant pain intermittent. Describes the pain as sharp and rates 6 out of 10 at its worst. Chart review indicates she completed vancomycin treatment for C. difficile on June 2. She denies chest pain palpitation shortness of breath. She denies nausea vomiting fever chills headache dizziness syncope or near-syncope.     ED Course: In the emergency department she is afebrile hemodynamically stable not hypoxic.  Review of Systems: As per HPI otherwise 10 point review of systems negative.   Ambulatory Status: Uses a walker  Past Medical History  Diagnosis Date  . Coronary artery disease   . CHF (congestive heart failure) (HCC)     systolic  . COPD (chronic obstructive pulmonary disease) (HCC)   . Hypertension   . Renal disorder   . ESRD (end stage renal disease) (HCC)     tues/thurs/sat dialysis  . DVT (deep venous thrombosis) (HCC)     on coumadin  . C.  difficile colitis   . Diabetes mellitus (HCC)   . Obstructive sleep apnea   . Anemia     Past Surgical History  Procedure Laterality Date  . Insertion of dialysis catheter Left 04/03/2016    Procedure: INSERTION OF DIALYSIS CATHETER;  Surgeon: Larina Earthly, MD;  Location: [facility] OR;  Service: Vascular;  Laterality: Left;  . Av fistula placement Left 04/03/2016    Procedure: ARTERIOVENOUS (AV) FISTULA CREATION;  Surgeon: Larina Earthly, MD;  Location: [facility] OR;  Service: Vascular;  Laterality: Left;    Social History   Social History  . Marital Status: Single    Spouse Name: N/A  . Number of Children: N/A  . Years of Education: N/A   Occupational History  . Not on file.   Social History Main Topics  . Smoking status: Former Smoker    Types: Cigarettes    Quit date: 03/03/2006  . Smokeless tobacco: Never Used  . Alcohol Use: No  . Drug Use: No  . Sexual Activity: No   Other Topics Concern  . Not on file   Social History Narrative    Allergies  Allergen Reactions  . Entresto [Sacubitril-Valsartan] Other (See Comments)    On MAR    History reviewed. No pertinent family history. Irving Burton medical history reviewed noncontributory to the admission of this elderly lady  Prior to Admission medications   Medication Sig Start Date End Date Taking? Authorizing Provider  acetaminophen (TYLENOL) 325 MG tablet Take 650 mg by mouth every 6 (six) hours as needed for mild pain.    Historical Provider, MD  albuterol (PROVENTIL HFA;VENTOLIN HFA) 108 (90 Base) MCG/ACT inhaler Inhale 2 puffs into the lungs every 6 (six) hours as needed for wheezing or shortness of breath.     Historical Provider, MD  albuterol (PROVENTIL HFA;VENTOLIN HFA) 108 (90 Base) MCG/ACT inhaler Inhale 2 puffs into the lungs 4 (four) times daily as needed for wheezing or shortness of breath.    Historical Provider, MD  albuterol (PROVENTIL) (2.5 MG/3ML) 0.083% nebulizer solution Take 3 mLs (2.5 mg total) by nebulization every 6  (six) hours as needed for wheezing or shortness of breath. 04/10/16   Dorothea Ogle, MD  atorvastatin (LIPITOR) 10 MG tablet Take 10 mg by mouth daily.    Historical Provider, MD  budesonide (PULMICORT) 0.5 MG/2ML nebulizer solution Take 2 mLs (0.5 mg total) by nebulization 2 (two) times daily. 04/10/16   Dorothea Ogle, MD  carvedilol (COREG) 3.125 MG tablet Take 1 tablet (3.125 mg total) by mouth 2 (two) times daily with a meal. 04/10/16   Dorothea Ogle, MD  enoxaparin (LOVENOX) 100 MG/ML injection Inject 1 mL (100 mg total) into the skin daily. 04/10/16   Dorothea Ogle, MD  famotidine (PEPCID) 20 MG tablet Take 1 tablet (20 mg total) by mouth daily. 04/10/16   Dorothea Ogle, MD  insulin glargine (LANTUS) 100 UNIT/ML injection Inject 18 Units into the skin at bedtime.    Historical Provider, MD  insulin lispro (HUMALOG) 100 UNIT/ML injection Inject 5 Units into the skin 3 (three) times daily with meals. For CBG > 180    Historical Provider, MD  Inulin (FIBER CHOICE) 1.5 g CHEW Chew 2 tablets by mouth daily as needed (for constipation).     Historical Provider, MD  LORazepam (ATIVAN) 0.5 MG tablet Take 0.5 mg by mouth 2 (two) times daily as needed for anxiety.    Historical Provider, MD  multivitamin (RENA-VIT) TABS tablet Take 1 tablet by mouth at bedtime. 04/10/16   Dorothea Ogle, MD  OXYGEN Inhale 2 L into the lungs at bedtime.    Historical Provider, MD  saccharomyces boulardii (FLORASTOR) 250 MG capsule Take 250 mg by mouth 2 (two) times daily.    Historical Provider, MD  vancomycin (VANCOCIN) 125 MG capsule Take 125 mg by mouth every 6 (six) hours. Stop date 04/28/16    Historical Provider, MD  warfarin (COUMADIN) 7.5 MG tablet Take 1 tablet (7.5 mg total) by mouth daily at 6 PM. 04/10/16   Dorothea Ogle, MD    Physical Exam: Filed Vitals:   05/06/16 1130 05/06/16 1200 05/06/16 1230 05/06/16 1545  BP: 101/58 103/65 107/62 122/72  Pulse: 74 74 74 77  Temp:      TempSrc:      Resp: 18 19 16 20     SpO2: 97% 97% 95% 98%     General:  Appears calm and comfortable, smiling Eyes:  PERRL, EOMI, normal lids, iris ENT:  grossly normal hearing, lips & tongue, mmm Neck:  no LAD, masses or thyromegaly Cardiovascular:  RRR, no m/r/g. Trace to 1+ lower extremity edema up to abdomen.  Respiratory:  CTA bilaterally, no w/r/r. Normal respiratory effort. Abdomen:  Somewhat distended slightly from nontender to palpation sluggish bowel sounds Skin:  no rash or induration seen on limited exam Musculoskeletal:  grossly normal tone BUE/BLE, good ROM, no bony abnormality Psychiatric:  grossly normal mood and affect, speech fluent and appropriate, AOx3 Neurologic:  CN 2-12 grossly intact, moves all extremities in coordinated fashion, sensation intact  Labs  on Admission: I have personally reviewed following labs and imaging studies  CBC:  Recent Labs Lab 05/06/16 1240  WBC 5.3  NEUTROABS 3.3  HGB 9.3*  HCT 29.8*  MCV 85.4  PLT 268   Basic Metabolic Panel:  Recent Labs Lab 05/06/16 1240  NA 136  K 3.9  CL 102  CO2 25  GLUCOSE 104*  BUN 44*  CREATININE 8.08*  CALCIUM 8.7*   GFR: CrCl cannot be calculated (Unknown ideal weight.). Liver Function Tests:  Recent Labs Lab 05/06/16 1240  AST 18  ALT 11*  ALKPHOS 180*  BILITOT 1.5*  PROT 7.3  ALBUMIN 2.4*    Recent Labs Lab 05/06/16 1240  LIPASE 37   No results for input(s): AMMONIA in the last 168 hours. Coagulation Profile:  Recent Labs Lab 05/06/16 1240  INR 2.66*   Cardiac Enzymes: No results for input(s): CKTOTAL, CKMB, CKMBINDEX, TROPONINI in the last 168 hours. BNP (last 3 results) No results for input(s): PROBNP in the last 8760 hours. HbA1C: No results for input(s): HGBA1C in the last 72 hours. CBG: No results for input(s): GLUCAP in the last 168 hours. Lipid Profile: No results for input(s): CHOL, HDL, LDLCALC, TRIG, CHOLHDL, LDLDIRECT in the last 72 hours. Thyroid Function Tests: No results for  input(s): TSH, T4TOTAL, FREET4, T3FREE, THYROIDAB in the last 72 hours. Anemia Panel: No results for input(s): VITAMINB12, FOLATE, FERRITIN, TIBC, IRON, RETICCTPCT in the last 72 hours. Urine analysis:    Component Value Date/Time   COLORURINE AMBER* 03/14/2016 1655   APPEARANCEUR CLOUDY* 03/14/2016 1655   LABSPEC 1.019 03/14/2016 1655   PHURINE 5.0 03/14/2016 1655   GLUCOSEU NEGATIVE 03/14/2016 1655   HGBUR LARGE* 03/14/2016 1655   BILIRUBINUR SMALL* 03/14/2016 1655   KETONESUR 15* 03/14/2016 1655   PROTEINUR 30* 03/14/2016 1655   NITRITE NEGATIVE 03/14/2016 1655   LEUKOCYTESUR TRACE* 03/14/2016 1655    Creatinine Clearance: CrCl cannot be calculated (Unknown ideal weight.).  Sepsis Labs: @LABRCNTIP (procalcitonin:4,lacticidven:4) )No results found for this or any previous visit (from the past 240 hour(s)).   Radiological Exams on Admission: Ct Abdomen Pelvis Wo Contrast  05/06/2016  CLINICAL DATA:  Patient has had Celsius diff and diarrhea for 30 days. Increasing mid abdominal pain. EXAM: CT ABDOMEN AND PELVIS WITHOUT CONTRAST TECHNIQUE: Multidetector CT imaging of the abdomen and pelvis was performed following the standard protocol without IV contrast. COMPARISON:  None. FINDINGS: A dialysis catheter, entering via a left femoral approach, terminates in the right atrium. Cardiomegaly is identified. Small bilateral effusions are identified with underlying atelectasis. No other lung base abnormalities. Increased attenuation in the subcutaneous fat is consistent with volume overload. Ascites is seen in the abdomen. No free air. Evaluation of the colon is limited due to lack of distention with contrast. However, given the history of Clostridium difficile, there does appear to be colonic wall thickening extending from the distal transverse colon through the descending colon. There may also be thickening of the colon wall in the ascending and proximal transverse colon. The stomach and small  bowel are unremarkable. Cholelithiasis is seen in the gallbladder. The liver, spleen, adrenal glands, and pancreas are unremarkable. The kidneys are unremarkable. No suspicious adenopathy. The pelvis demonstrates a left femoral line. No suspicious masses or adenopathy. Free fluid identified. No acute bony abnormalities. IMPRESSION: 1. The study is limited due to lack of contrast in addition to volume overload resulting in increased attenuation in the fat diffusely, pleural effusions, and ascites. 2. Evaluation of the colon is limited  but I do suspect colonic wall thickening as described above consistent with the history of Clostridium difficile. 3. No other acute abnormalities. Electronically Signed   By: Gerome Sam III M.D   On: 05/06/2016 14:13    EKG: Independently reviewed. Sinus rhythm Nonspecific intraventricular conduction delay  Assessment/Plan Principal Problem:   Acute on chronic renal failure (HCC) Active Problems:   DVT (deep venous thrombosis), right   Chronic systolic congestive heart failure (HCC)   Coronary artery disease involving native coronary artery of native heart without angina pectoris   Essential hypertension, malignant   Diabetes mellitus type 2 in obese (HCC)   COPD (chronic obstructive pulmonary disease) (HCC)   Obstructive sleep apnea   AICD (automatic cardioverter/defibrillator) present   ESRD on dialysis (HCC)   Pressure ulcer   Anemia of chronic renal failure, stage 5 (HCC)   Diarrhea   RUQ pain   Anemia   #1. Acute on chronic renal failure. Patient is a Tuesday Thursday Saturday dialysis patient she has missed the last 2 scheduled visits due to persistent diarrhea. Creatinine 8.08, a son appears to be around 4. BUN 44. Potassium within the limits of normal. Chart review indicates estimated dry weight 105 kg -Nephrology consult -Daily weights -Continue home meds  2. Persistent diarrhea patient just completed by mouth vancomycin for same. CT notes  colonic wall thickening consistent with history of C. difficile colitis as well as cholelithiasis.. Reports 3-4 loose stools daily.Stool sent for C. difficile by ED M.D. will likely be positive. -enteric precautions -stoole gastrointestinal panel -Clear liquids -Hold off on antibiotics for now until results of stool studies  #3. Right upper quadrant pain. Likely related to volume in setting of #2. History requesting food on admission -Pain management -If no improvement consider reimaging -Dialysis per nephrology  #4. Chronic systolic heart failure. Recent echo with EF of 35-40% grade 2 diastolic dysfunction moderate dilated RV, mild LAE, moderate RAE, severe TR. Home medications include Coreg. Appears somewhat overloaded due to missed dialysis sessions. Of note chart review indicates: Weight 105 kg -Continue Coreg -Monitor daily weights -Monitor intake and output -Dialysis per nephrology  #5. COPD. Appears stable at baseline. CT as noted above -Continue home nebulizers -Monitor oxygen saturation level  6. Diabetes. Serum glucose 104 on admission. Home medications include 18 units of Lantus daily at bedtime as well as sliding scale. -We'll continue Lantus at a lower dose -Sliding scale insulin for optimal control -Obtain a hemoglobin A1c  7. History of DVT. Patient on Coumadin INR 2.66 -Pharmacy to dose Coumadin  #8. Hypertension. Blood pressure somewhat soft in the emergency department. Home meds include low-dose Coreg. -We'll continue beta blocker with parameters -Monitor blood pressure  #9. CAD, was on aspirin and Plavix in the past. Was held due to hematuria. No chest pain. KG with no acute changes -Continue statin    DVT prophylaxis: warfarin  Code Status: full  Family Communication: none  Disposition Plan: back to facility  Consults called: nephrology  Admission status: inpatient    Gwenyth Bender MD [facility]  If 7PM-7AM, please contact  night-coverage [email or web address] Password [email or web address]  05/06/2016, 4:07 PM

## 2016-05-06 NOTE — ED Provider Notes (Signed)
CSN: [id number]     Arrival date & time 05/06/16  1119 History   First MD Initiated Contact with Patient 05/06/16 1136     Chief Complaint  Patient presents with  . Diarrhea     (Consider location/radiation/quality/duration/timing/severity/associated sxs/prior Treatment) HPI Brandy Dudley is a 65 y.o. female with history of coronary artery disease, CHF, COPD, hypertension, recent C. difficile infection, presents to emergency department with complaint of persistent diarrhea and abdominal pain. Patient apparently has had diarrhea for over a month. She was in the hospital for 3 weeks for the same, discharged on 04/10/16. Discharged on vancomycin oral. She states she is almost done with the treatment, but diarrhea is getting worse. She is now complaining of abdominal pain as well. She denies any nausea or vomiting. No fever. Patient states the diarrhea has been so severe that she had to miss her last 2 dialysis treatments. Her last dialysis was 4 days ago.  Past Medical History  Diagnosis Date  . Coronary artery disease   . CHF (congestive heart failure) (HCC)   . COPD (chronic obstructive pulmonary disease) (HCC)   . Hypertension    Past Surgical History  Procedure Laterality Date  . Insertion of dialysis catheter Left 04/03/2016    Procedure: INSERTION OF DIALYSIS CATHETER;  Surgeon: Larina Earthly, MD;  Location: [facility] OR;  Service: Vascular;  Laterality: Left;  . Av fistula placement Left 04/03/2016    Procedure: ARTERIOVENOUS (AV) FISTULA CREATION;  Surgeon: Larina Earthly, MD;  Location: [facility] OR;  Service: Vascular;  Laterality: Left;   History reviewed. No pertinent family history. Social History  Substance Use Topics  . Smoking status: Former Smoker    Types: Cigarettes    Quit date: 03/03/2006  . Smokeless tobacco: Never Used  . Alcohol Use: No   OB History    No data available     Review of Systems  Constitutional: Negative for fever and chills.  Respiratory: Negative for cough, chest  tightness and shortness of breath.   Cardiovascular: Negative for chest pain, palpitations and leg swelling.  Gastrointestinal: Positive for abdominal pain and diarrhea. Negative for nausea and vomiting.  Genitourinary: Negative for dysuria, flank pain and pelvic pain.  Musculoskeletal: Negative for myalgias, arthralgias, neck pain and neck stiffness.  Skin: Negative for rash.  Neurological: Negative for dizziness, weakness and headaches.  All other systems reviewed and are negative.     Allergies  Entresto  Home Medications   Prior to Admission medications   Medication Sig Start Date End Date Taking? Authorizing Provider  acetaminophen (TYLENOL) 325 MG tablet Take 650 mg by mouth every 6 (six) hours as needed for mild pain.    Historical Provider, MD  albuterol (PROVENTIL HFA;VENTOLIN HFA) 108 (90 Base) MCG/ACT inhaler Inhale 2 puffs into the lungs every 6 (six) hours as needed for wheezing or shortness of breath.     Historical Provider, MD  albuterol (PROVENTIL HFA;VENTOLIN HFA) 108 (90 Base) MCG/ACT inhaler Inhale 2 puffs into the lungs 4 (four) times daily as needed for wheezing or shortness of breath.    Historical Provider, MD  albuterol (PROVENTIL) (2.5 MG/3ML) 0.083% nebulizer solution Take 3 mLs (2.5 mg total) by nebulization every 6 (six) hours as needed for wheezing or shortness of breath. 04/10/16   Dorothea Ogle, MD  atorvastatin (LIPITOR) 10 MG tablet Take 10 mg by mouth daily.    Historical Provider, MD  budesonide (PULMICORT) 0.5 MG/2ML nebulizer solution Take 2 mLs (0.5 mg total) by  nebulization 2 (two) times daily. 04/10/16   Dorothea Ogle, MD  carvedilol (COREG) 3.125 MG tablet Take 1 tablet (3.125 mg total) by mouth 2 (two) times daily with a meal. 04/10/16   Dorothea Ogle, MD  enoxaparin (LOVENOX) 100 MG/ML injection Inject 1 mL (100 mg total) into the skin daily. 04/10/16   Dorothea Ogle, MD  famotidine (PEPCID) 20 MG tablet Take 1 tablet (20 mg total) by mouth daily.  04/10/16   Dorothea Ogle, MD  insulin glargine (LANTUS) 100 UNIT/ML injection Inject 18 Units into the skin at bedtime.    Historical Provider, MD  insulin lispro (HUMALOG) 100 UNIT/ML injection Inject 5 Units into the skin 3 (three) times daily with meals. For CBG > 180    Historical Provider, MD  Inulin (FIBER CHOICE) 1.5 g CHEW Chew 2 tablets by mouth daily as needed (for constipation).     Historical Provider, MD  LORazepam (ATIVAN) 0.5 MG tablet Take 0.5 mg by mouth 2 (two) times daily as needed for anxiety.    Historical Provider, MD  multivitamin (RENA-VIT) TABS tablet Take 1 tablet by mouth at bedtime. 04/10/16   Dorothea Ogle, MD  OXYGEN Inhale 2 L into the lungs at bedtime.    Historical Provider, MD  saccharomyces boulardii (FLORASTOR) 250 MG capsule Take 250 mg by mouth 2 (two) times daily.    Historical Provider, MD  vancomycin (VANCOCIN) 125 MG capsule Take 125 mg by mouth every 6 (six) hours. Stop date 04/28/16    Historical Provider, MD  warfarin (COUMADIN) 7.5 MG tablet Take 1 tablet (7.5 mg total) by mouth daily at 6 PM. 04/10/16   Dorothea Ogle, MD   BP 107/62 mmHg  Pulse 74  Temp(Src) 97.5 F (36.4 C) (Oral)  Resp 16  SpO2 95% Physical Exam  Constitutional: She is oriented to person, place, and time. She appears well-developed and well-nourished. No distress.  HENT:  Head: Normocephalic.  Eyes: Conjunctivae are normal.  Neck: Neck supple.  Cardiovascular: Normal rate, regular rhythm and normal heart sounds.   Pulmonary/Chest: Effort normal and breath sounds normal. No respiratory distress. She has no wheezes. She has no rales.  Abdominal: Soft. Bowel sounds are normal. She exhibits no distension. There is tenderness. There is no rebound.  RLQ tenderness. Distention and ascites present.   Musculoskeletal: She exhibits no edema.  Neurological: She is alert and oriented to person, place, and time.  Skin: Skin is warm and dry.  Psychiatric: She has a normal mood and affect.  Her behavior is normal.  Nursing note and vitals reviewed.   ED Course  Procedures (including critical care time) Labs Review Labs Reviewed  CBC WITH DIFFERENTIAL/PLATELET - Abnormal; Notable for the following:    RBC 3.49 (*)    Hemoglobin 9.3 (*)    HCT 29.8 (*)    RDW 17.3 (*)    All other components within normal limits  COMPREHENSIVE METABOLIC PANEL - Abnormal; Notable for the following:    Glucose, Bld 104 (*)    BUN 44 (*)    Creatinine, Ser 8.08 (*)    Calcium 8.7 (*)    Albumin 2.4 (*)    ALT 11 (*)    Alkaline Phosphatase 180 (*)    Total Bilirubin 1.5 (*)    GFR calc non Af Amer 5 (*)    GFR calc Af Amer 5 (*)    All other components within normal limits  PROTIME-INR - Abnormal; Notable for the  following:    Prothrombin Time 28.0 (*)    INR 2.66 (*)    All other components within normal limits  C DIFFICILE QUICK SCREEN W PCR REFLEX  LIPASE, BLOOD  URINALYSIS, ROUTINE W REFLEX MICROSCOPIC (NOT AT [facility])    Imaging Review Ct Abdomen Pelvis Wo Contrast  05/06/2016  CLINICAL DATA:  Patient has had Celsius diff and diarrhea for 30 days. Increasing mid abdominal pain. EXAM: CT ABDOMEN AND PELVIS WITHOUT CONTRAST TECHNIQUE: Multidetector CT imaging of the abdomen and pelvis was performed following the standard protocol without IV contrast. COMPARISON:  None. FINDINGS: A dialysis catheter, entering via a left femoral approach, terminates in the right atrium. Cardiomegaly is identified. Small bilateral effusions are identified with underlying atelectasis. No other lung base abnormalities. Increased attenuation in the subcutaneous fat is consistent with volume overload. Ascites is seen in the abdomen. No free air. Evaluation of the colon is limited due to lack of distention with contrast. However, given the history of Clostridium difficile, there does appear to be colonic wall thickening extending from the distal transverse colon through the descending colon. There may also be  thickening of the colon wall in the ascending and proximal transverse colon. The stomach and small bowel are unremarkable. Cholelithiasis is seen in the gallbladder. The liver, spleen, adrenal glands, and pancreas are unremarkable. The kidneys are unremarkable. No suspicious adenopathy. The pelvis demonstrates a left femoral line. No suspicious masses or adenopathy. Free fluid identified. No acute bony abnormalities. IMPRESSION: 1. The study is limited due to lack of contrast in addition to volume overload resulting in increased attenuation in the fat diffusely, pleural effusions, and ascites. 2. Evaluation of the colon is limited but I do suspect colonic wall thickening as described above consistent with the history of Clostridium difficile. 3. No other acute abnormalities. Electronically Signed   By: Gerome Sam III M.D   On: 05/06/2016 14:13   I have personally reviewed and evaluated these images and lab results as part of my medical decision-making.   EKG Interpretation   Date/Time:  Saturday May 06 2016 11:25:01 EDT Ventricular Rate:  75 PR Interval:  155 QRS Duration: 129 QT Interval:  410 QTC Calculation: 458 R Axis:   90 Text Interpretation:  Sinus rhythm Nonspecific intraventricular conduction  delay Confirmed by KOHUT  MD, STEPHEN ([id number]) on 05/06/2016 1:27:12 PM      MDM   Final diagnoses:  Diarrhea  Abdominal pain   Patient with continued his diarrhea from skilled nursing facility. Did not go to dialysis Thursday or today because of the diarrhea. Patient stated that they would not take her. Recent admission for C. difficile. Finished vancomycin last week. Patient also with pretty significant tenderness on abdominal exam, will get CT abdomen and pelvis and repeat labs.   Patient CT scan shows fluid retention as well as colitis. Patient is not on any medications at this time and just finished vancomycin last week for C. difficile. Her C. difficile cultures are pending. Will  admit for further evaluation. Patient will also need to be dialyzed.  I spoke with [facility], they will admit patient. Asked for a nephrology consult. Spoke with nephrology, they will admit.   Filed Vitals:   05/06/16 1130 05/06/16 1200 05/06/16 1230 05/06/16 1545  BP: 101/58 103/65 107/62 122/72  Pulse: 74 74 74 77  Temp:      TempSrc:      Resp: SpO2: 97% 97% 95% 98%  Jaynie Crumble, PA-C 05/06/16 1629  Raeford Razor, MD 05/19/16 2140

## 2016-05-06 NOTE — Progress Notes (Signed)
Attempted to get report from ED, placed on hold. 

## 2016-05-06 NOTE — ED Notes (Signed)
From [facility]: Sent for 30 days of diarrhea, in treatment for Cdiff. Finsihed antibiotics 1 week ago. Did not go to dialysis Thursday or today due to diarrhea. Reports 4 liquid stools/day. RUQ for last 3 days which is new.

## 2016-05-06 NOTE — Consult Note (Signed)
[facility] Renal Consultation Note    Indication for Consultation:  Management of ESRD/hemodialysis; anemia, hypertension/volume and secondary hyperparathyroidism PCP:  HPI: Brandy Dudley is a 65 y.o. female with ESRD D/T cardiorenal syndrome, recently started on hemodialysis at [facility] on TTS. Patient has history of HF with EF 35% -40% (02/2016), COPD, hypertension, DMT2,C diff colitis after treatment of breast abscess with clindamycin  ( C diff toxin neg/antigen positive04/25/17) H/O DVT on coumadin, .  Patient reports that she had diarrhea Tuesday and Thursday. She states that the nursing facility [facility] that she is staying at did not want to send to her to dialysis alone but they had no one to go with her. She presents to ED for evaluation of diarrhea and new onset of lower abdominal pain for past three days. She is afebrile, BP 122/72 HR 70s RR 16-20 O2 sat 98%. Labs Na 136 K+ 3.9 Scr 8.8 BUN 44 WBC 5.3 Hgb 9.3 Hct 29.8 Plt 268.  She denies fever, chills, has some nausea, no emesis ("I think it was just bad food") no C/O Chest pain/SOB, palpitations, orthopnea, + C/O lower abdominal pain, denies tarry stools, hematochezia, dysuria, flank pain, headaches, changes in vision or hearing.   Patient is from [place], came to [place] 2 years ago to care for sister. 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 She is a former smoker, quit 10 years ago. Denies current alcohol use or illicit substance abuse.   Past Medical History  Diagnosis Date  . Coronary artery disease   . CHF (congestive heart failure) (HCC)   . COPD (chronic obstructive pulmonary disease) (HCC)   . Hypertension    Past Surgical History  Procedure Laterality Date  . Insertion of dialysis catheter Left 04/03/2016    Procedure: INSERTION OF DIALYSIS CATHETER;  Surgeon: Larina Earthly, MD;  Location: [facility] OR;  Service: Vascular;  Laterality: Left;  . Av fistula  placement Left 04/03/2016    Procedure: ARTERIOVENOUS (AV) FISTULA CREATION;  Surgeon: Larina Earthly, MD;  Location: [facility] OR;  Service: Vascular;  Laterality: Left;   History reviewed. No pertinent family history. Social History:  reports that she quit smoking about 10 years ago. Her smoking use included Cigarettes. She has never used smokeless tobacco. She reports that she does not drink alcohol or use illicit drugs. Allergies  Allergen Reactions  . Entresto [Sacubitril-Valsartan] Other (See Comments)    On MAR   Prior to Admission medications   Medication Sig Start Date End Date Taking? Authorizing Provider  acetaminophen (TYLENOL) 325 MG tablet Take 650 mg by mouth every 6 (six) hours as needed for mild pain.    Historical Provider, MD  albuterol (PROVENTIL HFA;VENTOLIN HFA) 108 (90 Base) MCG/ACT inhaler Inhale 2 puffs into the lungs every 6 (six) hours as needed for wheezing or shortness of breath.     Historical Provider, MD  albuterol (PROVENTIL HFA;VENTOLIN HFA) 108 (90 Base) MCG/ACT inhaler Inhale 2 puffs into the lungs 4 (four) times daily as needed for wheezing or shortness of breath.    Historical Provider, MD  albuterol (PROVENTIL) (2.5 MG/3ML) 0.083% nebulizer solution Take 3 mLs (2.5 mg total) by nebulization every 6 (six) hours as needed for wheezing or shortness of breath. 04/10/16   Dorothea Ogle, MD  atorvastatin (LIPITOR) 10 MG tablet Take 10 mg by mouth daily.    Historical Provider, MD  budesonide (PULMICORT) 0.5 MG/2ML nebulizer solution Take  2 mLs (0.5 mg total) by nebulization 2 (two) times daily. 04/10/16   Dorothea Ogle, MD  carvedilol (COREG) 3.125 MG tablet Take 1 tablet (3.125 mg total) by mouth 2 (two) times daily with a meal. 04/10/16   Dorothea Ogle, MD  enoxaparin (LOVENOX) 100 MG/ML injection Inject 1 mL (100 mg total) into the skin daily. 04/10/16   Dorothea Ogle, MD  famotidine (PEPCID) 20 MG tablet Take 1 tablet (20 mg total) by mouth daily. 04/10/16   Dorothea Ogle,  MD  insulin glargine (LANTUS) 100 UNIT/ML injection Inject 18 Units into the skin at bedtime.    Historical Provider, MD  insulin lispro (HUMALOG) 100 UNIT/ML injection Inject 5 Units into the skin 3 (three) times daily with meals. For CBG > 180    Historical Provider, MD  Inulin (FIBER CHOICE) 1.5 g CHEW Chew 2 tablets by mouth daily as needed (for constipation).     Historical Provider, MD  LORazepam (ATIVAN) 0.5 MG tablet Take 0.5 mg by mouth 2 (two) times daily as needed for anxiety.    Historical Provider, MD  multivitamin (RENA-VIT) TABS tablet Take 1 tablet by mouth at bedtime. 04/10/16   Dorothea Ogle, MD  OXYGEN Inhale 2 L into the lungs at bedtime.    Historical Provider, MD  saccharomyces boulardii (FLORASTOR) 250 MG capsule Take 250 mg by mouth 2 (two) times daily.    Historical Provider, MD  vancomycin (VANCOCIN) 125 MG capsule Take 125 mg by mouth every 6 (six) hours. Stop date 04/28/16    Historical Provider, MD  warfarin (COUMADIN) 7.5 MG tablet Take 1 tablet (7.5 mg total) by mouth daily at 6 PM. 04/10/16   Dorothea Ogle, MD   Current Facility-Administered Medications  Medication Dose Route Frequency Provider Last Rate Last Dose  . 0.9 %  sodium chloride infusion  250 mL Intravenous PRN Gwenyth Bender, NP      . acetaminophen (TYLENOL) tablet 650 mg  650 mg Oral Q6H PRN Gwenyth Bender, NP       Or  . acetaminophen (TYLENOL) suppository 650 mg  650 mg Rectal Q6H PRN Gwenyth Bender, NP      . albuterol (PROVENTIL HFA;VENTOLIN HFA) 108 (90 Base) MCG/ACT inhaler 2 puff  2 puff Inhalation Q6H PRN Gwenyth Bender, NP      . albuterol (PROVENTIL) (2.5 MG/3ML) 0.083% nebulizer solution 2.5 mg  2.5 mg Nebulization Q6H PRN Gwenyth Bender, NP      . atorvastatin (LIPITOR) tablet 10 mg  10 mg Oral Daily Lesle Chris Black, NP      . budesonide (PULMICORT) nebulizer solution 0.5 mg  0.5 mg Nebulization BID Lesle Chris Black, NP      . carvedilol (COREG) tablet 3.125 mg  3.125 mg Oral BID WC Lesle Chris Black, NP       . famotidine (PEPCID) tablet 20 mg  20 mg Oral Daily Lesle Chris Black, NP      . ferric gluconate (NULECIT) 125 mg in sodium chloride 0.9 % 100 mL IVPB  125 mg Intravenous Once Pola Corn, NP      . HYDROcodone-acetaminophen (NORCO/VICODIN) 5-325 MG per tablet 1-2 tablet  1-2 tablet Oral Q4H PRN Gwenyth Bender, NP      . insulin aspart (novoLOG) injection 0-15 Units  0-15 Units Subcutaneous TID WC Lesle Chris Black, NP      . insulin aspart (novoLOG) injection 0-5 Units  0-5 Units Subcutaneous QHS  Gwenyth Bender, NP      . insulin glargine (LANTUS) injection 10 Units  10 Units Subcutaneous QHS Lesle Chris Black, NP      . LORazepam (ATIVAN) tablet 0.5 mg  0.5 mg Oral BID PRN Gwenyth Bender, NP      . multivitamin (RENA-VIT) tablet 1 tablet  1 tablet Oral QHS Gwenyth Bender, NP      . ondansetron [facility]) tablet 4 mg  4 mg Oral Q6H PRN Gwenyth Bender, NP       Or  . ondansetron [facility]) injection 4 mg  4 mg Intravenous Q6H PRN Gwenyth Bender, NP      . saccharomyces boulardii (FLORASTOR) capsule 250 mg  250 mg Oral BID Lesle Chris Black, NP      . sodium chloride flush (NS) 0.9 % injection 3 mL  3 mL Intravenous Q12H Lesle Chris Black, NP      . sodium chloride flush (NS) 0.9 % injection 3 mL  3 mL Intravenous Q12H Lesle Chris Black, NP      . sodium chloride flush (NS) 0.9 % injection 3 mL  3 mL Intravenous PRN Gwenyth Bender, NP       Current Outpatient Prescriptions  Medication Sig Dispense Refill  . acetaminophen (TYLENOL) 325 MG tablet Take 650 mg by mouth every 6 (six) hours as needed for mild pain.    Marland Kitchen albuterol (PROVENTIL HFA;VENTOLIN HFA) 108 (90 Base) MCG/ACT inhaler Inhale 2 puffs into the lungs every 6 (six) hours as needed for wheezing or shortness of breath.     Marland Kitchen albuterol (PROVENTIL HFA;VENTOLIN HFA) 108 (90 Base) MCG/ACT inhaler Inhale 2 puffs into the lungs 4 (four) times daily as needed for wheezing or shortness of breath.    Marland Kitchen albuterol (PROVENTIL) (2.5 MG/3ML) 0.083% nebulizer solution Take  3 mLs (2.5 mg total) by nebulization every 6 (six) hours as needed for wheezing or shortness of breath. 75 mL 3  . atorvastatin (LIPITOR) 10 MG tablet Take 10 mg by mouth daily.    . budesonide (PULMICORT) 0.5 MG/2ML nebulizer solution Take 2 mLs (0.5 mg total) by nebulization 2 (two) times daily. 250 mL 3  . carvedilol (COREG) 3.125 MG tablet Take 1 tablet (3.125 mg total) by mouth 2 (two) times daily with a meal. 60 tablet 1  . enoxaparin (LOVENOX) 100 MG/ML injection Inject 1 mL (100 mg total) into the skin daily. 3 Syringe 0  . famotidine (PEPCID) 20 MG tablet Take 1 tablet (20 mg total) by mouth daily. 30 tablet 0  . insulin glargine (LANTUS) 100 UNIT/ML injection Inject 18 Units into the skin at bedtime.    . insulin lispro (HUMALOG) 100 UNIT/ML injection Inject 5 Units into the skin 3 (three) times daily with meals. For CBG > 180    . Inulin (FIBER CHOICE) 1.5 g CHEW Chew 2 tablets by mouth daily as needed (for constipation).     . LORazepam (ATIVAN) 0.5 MG tablet Take 0.5 mg by mouth 2 (two) times daily as needed for anxiety.    . multivitamin (RENA-VIT) TABS tablet Take 1 tablet by mouth at bedtime.  0  . OXYGEN Inhale 2 L into the lungs at bedtime.    . saccharomyces boulardii (FLORASTOR) 250 MG capsule Take 250 mg by mouth 2 (two) times daily.    . vancomycin (VANCOCIN) 125 MG capsule Take 125 mg by mouth every 6 (six) hours. Stop date 04/28/16    . warfarin (COUMADIN) 7.5 MG  tablet Take 1 tablet (7.5 mg total) by mouth daily at 6 PM. 20 tablet 0   Labs: Basic Metabolic Panel:  Recent Labs Lab 05/06/16 1240  NA 136  K 3.9  CL 102  CO2 25  GLUCOSE 104*  BUN 44*  CREATININE 8.08*  CALCIUM 8.7*   Liver Function Tests:  Recent Labs Lab 05/06/16 1240  AST 18  ALT 11*  ALKPHOS 180*  BILITOT 1.5*  PROT 7.3  ALBUMIN 2.4*    Recent Labs Lab 05/06/16 1240  LIPASE 37   CBC:  Recent Labs Lab 05/06/16 1240  WBC 5.3  NEUTROABS 3.3  HGB 9.3*  HCT 29.8*  MCV 85.4   PLT 268   CBG: No results for input(s): GLUCAP in the last 168 hours. Studies/Results: Ct Abdomen Pelvis Wo Contrast  05/06/2016  CLINICAL DATA:  Patient has had Celsius diff and diarrhea for 30 days. Increasing mid abdominal pain. EXAM: CT ABDOMEN AND PELVIS WITHOUT CONTRAST TECHNIQUE: Multidetector CT imaging of the abdomen and pelvis was performed following the standard protocol without IV contrast. COMPARISON:  None. FINDINGS: A dialysis catheter, entering via a left femoral approach, terminates in the right atrium. Cardiomegaly is identified. Small bilateral effusions are identified with underlying atelectasis. No other lung base abnormalities. Increased attenuation in the subcutaneous fat is consistent with volume overload. Ascites is seen in the abdomen. No free air. Evaluation of the colon is limited due to lack of distention with contrast. However, given the history of Clostridium difficile, there does appear to be colonic wall thickening extending from the distal transverse colon through the descending colon. There may also be thickening of the colon wall in the ascending and proximal transverse colon. The stomach and small bowel are unremarkable. Cholelithiasis is seen in the gallbladder. The liver, spleen, adrenal glands, and pancreas are unremarkable. The kidneys are unremarkable. No suspicious adenopathy. The pelvis demonstrates a left femoral line. No suspicious masses or adenopathy. Free fluid identified. No acute bony abnormalities. IMPRESSION: 1. The study is limited due to lack of contrast in addition to volume overload resulting in increased attenuation in the fat diffusely, pleural effusions, and ascites. 2. Evaluation of the colon is limited but I do suspect colonic wall thickening as described above consistent with the history of Clostridium difficile. 3. No other acute abnormalities. Electronically Signed   By: Gerome Sam III M.D   On: 05/06/2016 14:13    ROS: As per HPI  otherwise negative.   Physical Exam: Filed Vitals:   05/06/16 1130 05/06/16 1200 05/06/16 1230 05/06/16 1545  BP: 101/58 103/65 107/62 122/72  Pulse: 74 74 74 77  Temp:      TempSrc:      Resp: 18 19 16 20   SpO2: 97% 97% 95% 98%     General: Well developed, well nourished, in no acute distress. Head: Normocephalic, atraumatic, sclera non-icteric, mucus membranes are moist Neck: Supple. JVD not elevated. Lungs: Clear bilaterally to auscultation without wheezes, rales, or rhonchi. Breathing is unlabored. Heart: RRR with S1 S2. No murmurs, rubs, or gallops appreciated. Abdomen: Abdomen firm, active BS. Presence of umbilical hernia. Tenderness LLQ/RLQ. Having brown liquid stools. 3-4 per day.  + edema of flanks M-S:  Strength and tone appear normal for age. Lower extremities: 1+ pitting bilateral lower extremities.  Neuro: Alert and oriented X 3. Moves all extremities spontaneously. Psych:  Responds to questions appropriately with a normal affect. Dialysis Access: Maturing LUA AVF + bruit, L femoral TDC drsg needs changed.  Dialysis Orders: [facility] TTS 4 hours 400/800 EDW 101 kg 3.0 K/2.25 Ca Tunneled HD femoral catheter Mircera 150 mcg IV q 2 weeks (Last dose 05/02/16) Venofer 100 mg IV treatment X 10 (3 doses remaining) Heparin 3100 units IV q treatment  Assessment/Plan: 1.  Persistent diarrhea/H/O C Diff colitis: Per primary. Recently completed course of vanc at SNF. No antibiotics at present stool cultures pending. C Diff antigen positive C Diff toxin negative 03/20/16.  2.  ESRD - New start at [facility]. Missed HD Tues/Thurs. HD today on schedule. K+ 3.9 cont 3.0 K bath.  3.  Hypertension/volume  - OP med list not available in [place].SBP 100-120s in ED. Continue Coreg per primary. No wt yet on pt. Attempt 1.5-2 liters. Keep SBP > 100.  4.  Anemia  - HGB 9.3 Last ESA dose 05/02/16. Has been being loaded with Fe. Tsat 15 04/11/16. Will continue Fe load  unless patient has signs of sepsis.  5.  Metabolic bone disease - No Binders/VRDA in ecube. Check renal profile with HD. Phos 4.7 Ca 9.2 C Ca 9.8 PTH 112 (04/11/16) 6.  Nutrition - Albumin 2.4. Currently on clear liquids. Renal/Carb mod diet when able to eat, renal vit/prostat. 7.  DM: per primary 8.    Chronic Systolic HF: Cont coreg, monitor volume. Per primary 9.    COPD: Per primary.   Rita H. Manson Passey, NP-C 05/06/2016, 3:55 PM  [employer] [phone]  I have seen and examined this patient and agree with plan per Alonna Buckler.  64yo BF with ESRD on HD at [facility] sent from NH for worsening diarrhea.  Has not had HD since Tuesday as would not be brought to Dx unit due to diarrhea?   Suspect recurrent C diff.  Plan HD today.   Donnica Jarnagin T,MD 05/06/2016 5:05 PM

## 2016-05-06 NOTE — Progress Notes (Signed)
Attempted 2nd time to get report on patient from ED.

## 2016-05-07 DIAGNOSIS — N185 Chronic kidney disease, stage 5: Secondary | ICD-10-CM

## 2016-05-07 DIAGNOSIS — A047 Enterocolitis due to Clostridium difficile: Principal | ICD-10-CM

## 2016-05-07 DIAGNOSIS — N186 End stage renal disease: Secondary | ICD-10-CM

## 2016-05-07 DIAGNOSIS — I10 Essential (primary) hypertension: Secondary | ICD-10-CM

## 2016-05-07 DIAGNOSIS — Z95810 Presence of automatic (implantable) cardiac defibrillator: Secondary | ICD-10-CM

## 2016-05-07 DIAGNOSIS — I82401 Acute embolism and thrombosis of unspecified deep veins of right lower extremity: Secondary | ICD-10-CM

## 2016-05-07 DIAGNOSIS — D631 Anemia in chronic kidney disease: Secondary | ICD-10-CM

## 2016-05-07 DIAGNOSIS — I5042 Chronic combined systolic (congestive) and diastolic (congestive) heart failure: Secondary | ICD-10-CM

## 2016-05-07 DIAGNOSIS — A0472 Enterocolitis due to Clostridium difficile, not specified as recurrent: Secondary | ICD-10-CM

## 2016-05-07 DIAGNOSIS — Z992 Dependence on renal dialysis: Secondary | ICD-10-CM

## 2016-05-07 LAB — PROTIME-INR
INR: 2.65 — ABNORMAL HIGH (ref 0.00–1.49)
PROTHROMBIN TIME: 27.9 s — AB (ref 11.6–15.2)

## 2016-05-07 LAB — GASTROINTESTINAL PANEL BY PCR, STOOL (REPLACES STOOL CULTURE)
Adenovirus F40/41: NOT DETECTED
Astrovirus: NOT DETECTED
CRYPTOSPORIDIUM: NOT DETECTED
CYCLOSPORA CAYETANENSIS: NOT DETECTED
Campylobacter species: NOT DETECTED
E. COLI O157: NOT DETECTED
Entamoeba histolytica: NOT DETECTED
Enteroaggregative E coli (EAEC): NOT DETECTED
Enteropathogenic E coli (EPEC): NOT DETECTED
Enterotoxigenic E coli (ETEC): NOT DETECTED
Giardia lamblia: NOT DETECTED
Norovirus GI/GII: NOT DETECTED
Plesimonas shigelloides: NOT DETECTED
ROTAVIRUS A: NOT DETECTED
SAPOVIRUS (I, II, IV, AND V): NOT DETECTED
SHIGA LIKE TOXIN PRODUCING E COLI (STEC): NOT DETECTED
SHIGELLA/ENTEROINVASIVE E COLI (EIEC): NOT DETECTED
Salmonella species: NOT DETECTED
VIBRIO SPECIES: NOT DETECTED
Vibrio cholerae: NOT DETECTED
YERSINIA ENTEROCOLITICA: NOT DETECTED

## 2016-05-07 LAB — BASIC METABOLIC PANEL
Anion gap: 11 (ref 5–15)
BUN: 22 mg/dL — AB (ref 6–20)
CALCIUM: 8.4 mg/dL — AB (ref 8.9–10.3)
CHLORIDE: 100 mmol/L — AB (ref 101–111)
CO2: 25 mmol/L (ref 22–32)
CREATININE: 5.05 mg/dL — AB (ref 0.44–1.00)
GFR calc non Af Amer: 8 mL/min — ABNORMAL LOW (ref >60)
GFR, EST AFRICAN AMERICAN: 9 mL/min — AB (ref >60)
GLUCOSE: 63 mg/dL — AB (ref 65–99)
Potassium: 3.5 mmol/L (ref 3.5–5.1)
Sodium: 136 mmol/L (ref 135–145)

## 2016-05-07 LAB — GLUCOSE, CAPILLARY
GLUCOSE-CAPILLARY: 89 mg/dL (ref 65–99)
GLUCOSE-CAPILLARY: 96 mg/dL (ref 65–99)
GLUCOSE-CAPILLARY: 101 mg/dL — AB (ref 65–99)
GLUCOSE-CAPILLARY: 100 mg/dL — AB (ref 65–99)

## 2016-05-07 LAB — CBC
HCT: 30.1 % — ABNORMAL LOW (ref 36.0–46.0)
HCT: 30.3 % — ABNORMAL LOW (ref 36.0–46.0)
Hemoglobin: 9.5 g/dL — ABNORMAL LOW (ref 12.0–15.0)
Hemoglobin: 9.4 g/dL — ABNORMAL LOW (ref 12.0–15.0)
MCH: 27.1 pg (ref 26.0–34.0)
MCH: 26.4 pg (ref 26.0–34.0)
MCHC: 31.6 g/dL (ref 30.0–36.0)
MCHC: 31 g/dL (ref 30.0–36.0)
MCV: 86 fL (ref 78.0–100.0)
MCV: 85.1 fL (ref 78.0–100.0)
PLATELETS: 219 10*3/uL (ref 150–400)
Platelets: 225 K/uL (ref 150–400)
RBC: 3.5 MIL/uL — ABNORMAL LOW (ref 3.87–5.11)
RBC: 3.56 MIL/uL — AB (ref 3.87–5.11)
RDW: 17.5 % — ABNORMAL HIGH (ref 11.5–15.5)
RDW: 17.2 % — ABNORMAL HIGH (ref 11.5–15.5)
WBC: 5.1 K/uL (ref 4.0–10.5)
WBC: 5.5 10*3/uL (ref 4.0–10.5)

## 2016-05-07 LAB — C DIFFICILE QUICK SCREEN W PCR REFLEX
C DIFFICILE (CDIFF) INTERP: POSITIVE
C Diff antigen: POSITIVE — AB
C Diff toxin: POSITIVE — AB

## 2016-05-07 MED ORDER — VANCOMYCIN 50 MG/ML ORAL SOLUTION
125.0000 mg | Freq: Four times a day (QID) | ORAL | Status: DC
  Administered 2016-05-07 – 2016-05-12 (×20): 125 mg via ORAL
  Filled 2016-05-07 (×27): qty 2.5

## 2016-05-07 MED ORDER — INSULIN ASPART 100 UNIT/ML ~~LOC~~ SOLN: 0.0000 [IU] | Freq: Every day | SUBCUTANEOUS | Status: DC

## 2016-05-07 MED ORDER — INSULIN ASPART 100 UNIT/ML ~~LOC~~ SOLN
0.0000 [IU] | Freq: Three times a day (TID) | SUBCUTANEOUS | Status: DC
  Administered 2016-05-09 – 2016-05-10 (×2): 1 [IU] via SUBCUTANEOUS
  Administered 2016-05-10 – 2016-05-11 (×3): 2 [IU] via SUBCUTANEOUS
  Administered 2016-05-12: 1 [IU] via SUBCUTANEOUS

## 2016-05-07 MED ORDER — INSULIN GLARGINE 100 UNIT/ML ~~LOC~~ SOLN
8.0000 [IU] | Freq: Every day | SUBCUTANEOUS | Status: DC
  Administered 2016-05-07: 8 [IU] via SUBCUTANEOUS
  Filled 2016-05-07 (×2): qty 0.08

## 2016-05-07 NOTE — Progress Notes (Signed)
Patient with several loose BM's (consistency of oatmeal), now with small dark,red clots. Continues to have runs of VTach. Dr.Tat notified.

## 2016-05-07 NOTE — Progress Notes (Signed)
PROGRESS NOTE  Brandy Dudley TMA:[id number] DOB: 18-May-1951 DOA: 05/06/2016 PCP: [name], MD  Brief History:  65 year old female with a history of ESRD secondary to cardiorenal syndrome, chronic systolic and diastolic CHF, C. difficile colitis, diabetes mellitus type 2, COPD, right IJ DVT, and hypertension presented with increasing diarrhea at her nursing facility.  She states that the nursing facility [facility]) that she is staying at did not want to send to her to dialysis alone but they had no one to go with her. She presents to ED for evaluation of diarrhea and new onset of lower abdominal pain for past three days. C. difficile antigen and toxin were positive, and the patient was started on vancomycin po on 05/07/2016. CT of the abdomen and pelvis revealed thickening of the distal transverse to the descending colon and cholelithiasis. Nephrology was consulted to assist with management.  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 She is a former smoker, quit 10 years ago.  Assessment/Plan: C.diff colitis -Initially developed C. difficile colitis on 03/21/2016 secondary to treatment with clindamycin for breast abscess -05/06/16--CT abd colonic wall thickening consistent with history of C. difficile colitis as well as cholelithiasis.. Reports 3-4 loose stools daily.Stool sent for C. difficile by ED M.D. will likely be positive. -enteric precautions  Hematochezia -likely due to C. Diff -Hgb stable -hemodynamically stable -hold coumadin without reversal -trend Hgb  ESRD -Patient is a Tuesday Thursday Saturday dialysis patient she has missed the last 2 scheduled visits due to persistent diarrhea.  -Chart review indicates estimated dry weight 105 kg -Appreciate Nephrology consult -Continue home meds  Chronic systolic and diastolic heart failure.  -03/21/16 echo EF of 35-40% grade 2 diastolic dysfunction moderate dilated RV, mild LAE, moderate RAE,  severe TR.  -Continue Coreg -Monitor daily weights -Monitor intake and output -Dialysis per nephrology -s/p AICD  COPD -Appears stable at baseline.  -Continue home nebulizers -stable on RA -Former smoker, quit 10 years ago  Diabetes mellitus, type 2, controlled -03/17/2016 hemoglobin A1c 6.4 -Home medications include 18 units of Lantus daily at bedtime as well as sliding scale. -Decrease Lantus to 8 units -Sliding scale insulin for optimal control -Obtain a hemoglobin A1c  History of DVT--R-IJ  -Patient on Coumadin INR 2.66 -Pharmacy to dose Coumadin -R IJ DVT in 3/17--from prior HD catheter--removed  Hypertension -Blood pressure somewhat soft in the emergency department -continue beta blocker with parameters -Monitor blood pressure  CAD  -was on aspirin and Plavix in the past. Was held due to hematuria. No chest pain. EKG with no acute changes -Continue statin  Nonsustained Vtach -keep K > 4 and Mag > 2 -am BMP   Disposition Plan:   Home in 2-3days  Family Communication:   Sister updated at beside 6/11  Consultants:  Nephrology  Code Status:  FULL    Subjective: Patient continues complaining of abdominal pain and not much better than yesterday. Denies any fevers, chills, chest pain, shortness breath, vomiting. She has some nausea. She was able to eat without any emesis today. Had one bowel movement with blood clot but other numerous bowel movements today without any blood.  Objective: Filed Vitals:   05/07/16 0420 05/07/16 0524 05/07/16 0806 05/07/16 0926  BP: 110/56 100/56  102/55  Pulse: 80 80  75  Temp: 98 F (36.7 C) 97.7 F (36.5 C)  97.6 F (36.4 C)  TempSrc: Oral Oral  Oral  Resp: 18  16  16  Weight: 99 kg (218 lb 4.1 oz)     SpO2: 100% 97% 98% 98%    Intake/Output Summary (Last 24 hours) at 05/07/16 1702 Last data filed at 05/07/16 1408  Gross per 24 hour  Intake    480 ml  Output   1501 ml  Net  -1021 ml   Weight change:   Exam:   General:  Pt is alert, follows commands appropriately, not in acute distress  HEENT: No icterus, No thrush, No neck mass, [place]/AT  Cardiovascular: RRR, S1/S2, no rubs, no gallops  Respiratory: Bibasilar crackles. No wheezing. Good air movement.  Abdomen: Soft/+BS, periumbilical tenderness without any rebound, non distended, no guarding  Extremities: trace LE edema, No lymphangitis, No petechiae, No rashes, no synovitis   Data Reviewed: I have personally reviewed following labs and imaging studies Basic Metabolic Panel:  Recent Labs Lab 05/06/16 1240 05/06/16 2254 05/07/16 0528  NA 136 135 136  K 3.9 4.1 3.5  CL 102 101 100*  CO2 25 22 25   GLUCOSE 104* 82 63*  BUN 44* 46* 22*  CREATININE 8.08* 8.26* 5.05*  CALCIUM 8.7* 8.4* 8.4*  PHOS 4.5 4.6  --    Liver Function Tests:  Recent Labs Lab 05/06/16 1240 05/06/16 2254  AST 18  --   ALT 11*  --   ALKPHOS 180*  --   BILITOT 1.5*  --   PROT 7.3  --   ALBUMIN 2.4* 2.3*    Recent Labs Lab 05/06/16 1240  LIPASE 37   No results for input(s): AMMONIA in the last 168 hours. Coagulation Profile:  Recent Labs Lab 05/06/16 1240 05/07/16 0528  INR 2.66* 2.65*   CBC:  Recent Labs Lab 05/06/16 1240 05/06/16 2250 05/07/16 0528  WBC 5.3 5.1 5.5  NEUTROABS 3.3  --   --   HGB 9.3* 9.5* 9.4*  HCT 29.8* 30.1* 30.3*  MCV 85.4 86.0 85.1  PLT 268 225 219   Cardiac Enzymes: No results for input(s): CKTOTAL, CKMB, CKMBINDEX, TROPONINI in the last 168 hours. BNP: Invalid input(s): POCBNP CBG:  Recent Labs Lab 05/06/16 1802 05/06/16 2003 05/07/16 0748 05/07/16 1217  GLUCAP 92 105* 89 96   HbA1C: No results for input(s): HGBA1C in the last 72 hours. Urine analysis:    Component Value Date/Time   COLORURINE AMBER* 03/14/2016 1655   APPEARANCEUR CLOUDY* 03/14/2016 1655   LABSPEC 1.019 03/14/2016 1655   PHURINE 5.0 03/14/2016 1655   GLUCOSEU NEGATIVE 03/14/2016 1655   HGBUR LARGE* 03/14/2016  1655   BILIRUBINUR SMALL* 03/14/2016 1655   KETONESUR 15* 03/14/2016 1655   PROTEINUR 30* 03/14/2016 1655   NITRITE NEGATIVE 03/14/2016 1655   LEUKOCYTESUR TRACE* 03/14/2016 1655   Sepsis Labs: @LABRCNTIP (procalcitonin:4,lacticidven:4) ) Recent Results (from the past 240 hour(s))  C difficile quick scan w PCR reflex     Status: Abnormal   Collection Time: 05/06/16 10:47 PM  Result Value Ref Range Status   C Diff antigen POSITIVE (A) NEGATIVE Final   C Diff toxin POSITIVE (A) NEGATIVE Final   C Diff interpretation Positive for toxigenic C. difficile  Final    Comment: CRITICAL RESULT CALLED TO, READ BACK BY AND VERIFIED WITH: KAMMIE MOORE,RN @0616  05/07/16 MKELLY   Gastrointestinal Panel by PCR , Stool     Status: None   Collection Time: 05/06/16 10:47 PM  Result Value Ref Range Status   Campylobacter species NOT DETECTED NOT DETECTED Final   Plesimonas shigelloides NOT DETECTED NOT DETECTED Final  Salmonella species NOT DETECTED NOT DETECTED Final   Yersinia enterocolitica NOT DETECTED NOT DETECTED Final   Vibrio species NOT DETECTED NOT DETECTED Final   Vibrio cholerae NOT DETECTED NOT DETECTED Final   Enteroaggregative E coli (EAEC) NOT DETECTED NOT DETECTED Final   Enteropathogenic E coli (EPEC) NOT DETECTED NOT DETECTED Final   Enterotoxigenic E coli (ETEC) NOT DETECTED NOT DETECTED Final   Shiga like toxin producing E coli (STEC) NOT DETECTED NOT DETECTED Final   E. coli O157 NOT DETECTED NOT DETECTED Final   Shigella/Enteroinvasive E coli (EIEC) NOT DETECTED NOT DETECTED Final   Cryptosporidium NOT DETECTED NOT DETECTED Final   Cyclospora cayetanensis NOT DETECTED NOT DETECTED Final   Entamoeba histolytica NOT DETECTED NOT DETECTED Final   Giardia lamblia NOT DETECTED NOT DETECTED Final   Adenovirus F40/41 NOT DETECTED NOT DETECTED Final   Astrovirus NOT DETECTED NOT DETECTED Final   Norovirus GI/GII NOT DETECTED NOT DETECTED Final   Rotavirus A NOT DETECTED NOT  DETECTED Final   Sapovirus (I, II, IV, and V) NOT DETECTED NOT DETECTED Final     Scheduled Meds: . sodium chloride   Intravenous STAT  . atorvastatin  10 mg Oral Daily  . budesonide  0.5 mg Nebulization BID  . carvedilol  3.125 mg Oral BID WC  . famotidine  20 mg Oral Daily  . heparin  3,100 Units Dialysis Once in dialysis  . insulin aspart  0-15 Units Subcutaneous TID WC  . insulin aspart  0-5 Units Subcutaneous QHS  . insulin glargine  10 Units Subcutaneous QHS  . multivitamin  1 tablet Oral QHS  . saccharomyces boulardii  250 mg Oral BID  . sodium chloride flush  3 mL Intravenous Q12H  . sodium chloride flush  3 mL Intravenous Q12H  . vancomycin  125 mg Oral Q6H  . warfarin  6 mg Oral q1800  . Warfarin - Pharmacist Dosing Inpatient   Does not apply q1800   Continuous Infusions:   Procedures/Studies: Ct Abdomen Pelvis Wo Contrast  05/06/2016  CLINICAL DATA:  Patient has had Celsius diff and diarrhea for 30 days. Increasing mid abdominal pain. EXAM: CT ABDOMEN AND PELVIS WITHOUT CONTRAST TECHNIQUE: Multidetector CT imaging of the abdomen and pelvis was performed following the standard protocol without IV contrast. COMPARISON:  None. FINDINGS: A dialysis catheter, entering via a left femoral approach, terminates in the right atrium. Cardiomegaly is identified. Small bilateral effusions are identified with underlying atelectasis. No other lung base abnormalities. Increased attenuation in the subcutaneous fat is consistent with volume overload. Ascites is seen in the abdomen. No free air. Evaluation of the colon is limited due to lack of distention with contrast. However, given the history of Clostridium difficile, there does appear to be colonic wall thickening extending from the distal transverse colon through the descending colon. There may also be thickening of the colon wall in the ascending and proximal transverse colon. The stomach and small bowel are unremarkable. Cholelithiasis is  seen in the gallbladder. The liver, spleen, adrenal glands, and pancreas are unremarkable. The kidneys are unremarkable. No suspicious adenopathy. The pelvis demonstrates a left femoral line. No suspicious masses or adenopathy. Free fluid identified. No acute bony abnormalities. IMPRESSION: 1. The study is limited due to lack of contrast in addition to volume overload resulting in increased attenuation in the fat diffusely, pleural effusions, and ascites. 2. Evaluation of the colon is limited but I do suspect colonic wall thickening as described above consistent with the history of  Clostridium difficile. 3. No other acute abnormalities. Electronically Signed   By: Gerome Sam III M.D   On: 05/06/2016 14:13    Davon Abdelaziz, DO  [facility] Pager [phone]  If 7PM-7AM, please contact night-coverage [email or web address] Password [email or web address] 05/07/2016, 5:02 PM   LOS: 1 day

## 2016-05-07 NOTE — Progress Notes (Signed)
ANTICOAGULATION CONSULT NOTE - Initial Consult  Pharmacy Consult for warfarin Indication: hx DVT  Allergies  Allergen Reactions  . Entresto [Sacubitril-Valsartan] Other (See Comments)    Not listed on [facility] 05/06/16 - unknown reaction  . Sulfa Antibiotics Itching    Patient Measurements: Weight: 218 lb 4.1 oz (99 kg)  Vital Signs: Temp: 97.6 F (36.4 C) (06/11 0926) Temp Source: Oral (06/11 0926) BP: 102/55 mmHg (06/11 0926) Pulse Rate: 75 (06/11 0926)  Labs:  Recent Labs  05/06/16 1240 05/06/16 2250 05/06/16 2254 05/07/16 0528  HGB 9.3* 9.5*  --  9.4*  HCT 29.8* 30.1*  --  30.3*  PLT 268 225  --  219  LABPROT 28.0*  --   --  27.9*  INR 2.66*  --   --  2.65*  CREATININE 8.08*  --  8.26* 5.05*    Estimated Creatinine Clearance: 13.7 mL/min (by C-G formula based on Cr of 5.05).   Medical History: Past Medical History  Diagnosis Date  . Coronary artery disease   . CHF (congestive heart failure) (HCC)     systolic  . COPD (chronic obstructive pulmonary disease) (HCC)   . Hypertension   . Renal disorder   . ESRD (end stage renal disease) (HCC)     tues/thurs/sat dialysis  . DVT (deep venous thrombosis) (HCC)     on coumadin  . C. difficile colitis   . Diabetes mellitus (HCC)   . Obstructive sleep apnea   . Anemia    Assessment: 65 yo F admitted 05/06/2016 with recurrent CDI. She is on chronic coumadin for history of DVT.  INR 2.65, H/H 9.4/30.3, plts 219, no bleeding noted  Home dose: 6mg  daily  Goal of Therapy:  INR 2-3 Monitor platelets by anticoagulation protocol: Yes   Plan:  - Warfarin 6mg  PO daily - Daily INR  Casilda Carls, PharmD. PGY-1 Pharmacy Resident Pager: [phone] 05/07/2016,9:54 AM

## 2016-05-07 NOTE — Progress Notes (Signed)
Admit: 05/06/2016 LOS: 1  19F ESRD [facility] admit with relapsed CDI, missed HD  Subjective:  HD yesterday Stool CDif positive; 1.5L UF; tolerated   06/10 0701 - 06/11 0700 In: 0  Out: 1501 [Stool:1]  Filed Weights   05/07/16 0020 05/07/16 0420  Weight: 100.3 kg (221 lb 1.9 oz) 99 kg (218 lb 4.1 oz)    Scheduled Meds: . sodium chloride   Intravenous STAT  . atorvastatin  10 mg Oral Daily  . budesonide  0.5 mg Nebulization BID  . carvedilol  3.125 mg Oral BID WC  . famotidine  20 mg Oral Daily  . heparin  3,100 Units Dialysis Once in dialysis  . insulin aspart  0-15 Units Subcutaneous TID WC  . insulin aspart  0-5 Units Subcutaneous QHS  . insulin glargine  10 Units Subcutaneous QHS  . multivitamin  1 tablet Oral QHS  . saccharomyces boulardii  250 mg Oral BID  . sodium chloride flush  3 mL Intravenous Q12H  . sodium chloride flush  3 mL Intravenous Q12H  . warfarin  6 mg Oral q1800  . Warfarin - Pharmacist Dosing Inpatient   Does not apply q1800   Continuous Infusions:  PRN Meds:.sodium chloride, sodium chloride, sodium chloride, acetaminophen **OR** acetaminophen, albuterol, alteplase, diphenhydrAMINE, heparin, HYDROcodone-acetaminophen, lidocaine (PF), lidocaine-prilocaine, LORazepam, ondansetron **OR** ondansetron (ZOFRAN) IV, pentafluoroprop-tetrafluoroeth, sodium chloride flush  Current Labs: reviewed    Physical Exam:  Blood pressure 100/56, pulse 80, temperature 97.7 F (36.5 C), temperature source Oral, resp. rate 16, weight 99 kg (218 lb 4.1 oz), SpO2 98 %. NAD Abd s, mild tenderness, +BS RRR CTAB  Dialysis Orders: [place] [facility] 4 hours 400/800 EDW 101 kg 3.0 K/2.25 Ca Tunneled HD femoral catheter Mircera 150 mcg IV q 2 weeks (Last dose 05/02/16) Venofer 100 mg IV treatment X 10 (3 doses remaining) Heparin 3100 units IV q treatment  A 1. ESRD [facility] via Fem HD cath 2. Relapsed CDI: per [facility] 3. CKD-MBD: stable 4. cCHF 5. Anemia on ESA and F  e  P 1. HD on [facility] Schedule 2. Probe EDW 3. Tx CDI   Sabra Heck MD 05/07/2016, 9:19 AM   Recent Labs Lab 05/06/16 1240 05/06/16 2254 05/07/16 0528  NA 136 135 136  K 3.9 4.1 3.5  CL 102 101 100*  CO2 25 22 25   GLUCOSE 104* 82 63*  BUN 44* 46* 22*  CREATININE 8.08* 8.26* 5.05*  CALCIUM 8.7* 8.4* 8.4*  PHOS 4.5 4.6  --     Recent Labs Lab 05/06/16 1240 05/06/16 2250 05/07/16 0528  WBC 5.3 5.1 5.5  NEUTROABS 3.3  --   --   HGB 9.3* 9.5* 9.4*  HCT 29.8* 30.1* 30.3*  MCV 85.4 86.0 85.1  PLT 268 225 219

## 2016-05-07 NOTE — Progress Notes (Signed)
CRITICAL VALUE ALERT  Critical value received:  Positive C-Diff   Date of notification:  05/07/16  Time of notification:  0615  Critical value read back:Yes.    Nurse who received alert:  K. Christell Constant  MD notified (1st page):  [place]  Time of first page:  0630

## 2016-05-08 ENCOUNTER — Encounter: Payer: Self-pay | Admitting: Vascular Surgery

## 2016-05-08 DIAGNOSIS — I2510 Atherosclerotic heart disease of native coronary artery without angina pectoris: Secondary | ICD-10-CM

## 2016-05-08 LAB — GLUCOSE, CAPILLARY
GLUCOSE-CAPILLARY: 63 mg/dL — AB (ref 65–99)
GLUCOSE-CAPILLARY: 100 mg/dL — AB (ref 65–99)
GLUCOSE-CAPILLARY: 131 mg/dL — AB (ref 65–99)
Glucose-Capillary: 92 mg/dL (ref 65–99)
Glucose-Capillary: 108 mg/dL — ABNORMAL HIGH (ref 65–99)

## 2016-05-08 LAB — BASIC METABOLIC PANEL
Anion gap: 9 (ref 5–15)
BUN: 28 mg/dL — ABNORMAL HIGH (ref 6–20)
CALCIUM: 8 mg/dL — AB (ref 8.9–10.3)
CHLORIDE: 103 mmol/L (ref 101–111)
CO2: 25 mmol/L (ref 22–32)
CREATININE: 6.52 mg/dL — AB (ref 0.44–1.00)
GFR, EST AFRICAN AMERICAN: 7 mL/min — AB (ref >60)
GFR, EST NON AFRICAN AMERICAN: 6 mL/min — AB (ref >60)
Glucose, Bld: 76 mg/dL (ref 65–99)
Potassium: 3.5 mmol/L (ref 3.5–5.1)
SODIUM: 137 mmol/L (ref 135–145)

## 2016-05-08 LAB — PROTIME-INR
INR: 2.62 — AB (ref 0.00–1.49)
PROTHROMBIN TIME: 27.6 s — AB (ref 11.6–15.2)

## 2016-05-08 LAB — CBC
HCT: 29.3 % — ABNORMAL LOW (ref 36.0–46.0)
HEMOGLOBIN: 9.2 g/dL — AB (ref 12.0–15.0)
MCH: 27 pg (ref 26.0–34.0)
MCHC: 31.4 g/dL (ref 30.0–36.0)
MCV: 85.9 fL (ref 78.0–100.0)
PLATELETS: 216 10*3/uL (ref 150–400)
RBC: 3.41 MIL/uL — ABNORMAL LOW (ref 3.87–5.11)
RDW: 17.3 % — AB (ref 11.5–15.5)
WBC: 5.5 10*3/uL (ref 4.0–10.5)

## 2016-05-08 LAB — MAGNESIUM: MAGNESIUM: 1.9 mg/dL (ref 1.7–2.4)

## 2016-05-08 MED ORDER — INSULIN GLARGINE 100 UNIT/ML ~~LOC~~ SOLN
4.0000 [IU] | Freq: Every day | SUBCUTANEOUS | Status: DC
  Administered 2016-05-08 – 2016-05-11 (×4): 4 [IU] via SUBCUTANEOUS
  Filled 2016-05-08 (×5): qty 0.04

## 2016-05-08 NOTE — Clinical Documentation Improvement (Signed)
Hospitalist  Please document query responses in the progress notes and discharge summary, not on the CDI BPA form in [facility]. Please do not deactivate queries without responding to them first. Thank you!  "Pressure Ulcer" is documented in the H&P and on the current hospital problem list in [facility].  However there is no nursing documentation regarding a pressure ulcer.  Please clarify if the patient has a pressure ulcer this admission:  - there is no pressure ulcer this admission  - there is a pressure ulcer this admission, including the location and stage.  Please exercise your independent, professional judgment when responding. A specific answer is not anticipated or expected.   Thank You, Jerral Ralph  RN BSN CCDS [phone] Health Information Management [place]

## 2016-05-08 NOTE — Progress Notes (Signed)
Blood noted in the stool, notified Dr. Arbutus Leas during progression.

## 2016-05-08 NOTE — Progress Notes (Signed)
PROGRESS NOTE  Brandy Dudley NOM:[id number] DOB: 1951/06/02 DOA: 05/06/2016 PCP: [name], MD Brief History:  65 year old female with a history of ESRD secondary to cardiorenal syndrome, chronic systolic and diastolic CHF, C. difficile colitis, diabetes mellitus type 2, COPD, right IJ DVT, and hypertension presented with increasing diarrhea at her nursing facility. She states that the nursing facility [facility]) that she is staying at did not want to send to her to dialysis alone but they had no one to go with her. She presents to ED for evaluation of diarrhea and new onset of lower abdominal pain for past three days. C. difficile antigen and toxin were positive, and the patient was started on vancomycin po on 05/07/2016. CT of the abdomen and pelvis revealed thickening of the distal transverse to the descending colon and cholelithiasis. Nephrology was consulted to assist with management. 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 She is a former smoker, quit 10 years ago.  Assessment/Plan: C.diff colitis -Initially developed C. difficile colitis on 03/21/2016 secondary to treatment with clindamycin for breast abscess -05/06/16--CT abd colonic wall thickening consistent with history of C. difficile colitis as well as cholelithiasis.. Reports 4-5 loose stools daily. -enteric precautions -Continue oral vancomycin  Hematochezia -likely due to C. Diff -Patient continues to have small amounts of hematochezia with her bowel movements -hemodynamically stable -hold coumadin without reversal--as the patient has had approximately 3 months anticoagulation and her R-IJ catheter had been removed; do not plan to restart coumadin at this point -allow INR to trend down on its own for now -trend Hgb--hemoglobin remained stable -Anticipate improvement of hematochezia with continued treatment of C. difficile colitis  ESRD -Patient is a Tuesday Thursday Saturday  dialysis patient she has missed the last 2 scheduled visits due to persistent diarrhea.  -Chart review indicates estimated dry weight 105 kg -Appreciate Nephrology consult -Continue home meds  Chronic systolic and diastolic heart failure.  -03/21/16 echo EF of 35-40% grade 2 diastolic dysfunction moderate dilated RV, mild LAE, moderate RAE, severe TR.  -Continue Coreg -Monitor daily weights -Monitor intake and output -Dialysis per nephrology -s/p AICD  COPD -Appears stable at baseline.  -Continue home nebulizers -stable on RA -Former smoker, quit 10 years ago  Diabetes mellitus, type 2, controlled -03/17/2016 hemoglobin A1c 6.4 -Home medications include 18 units of Lantus daily at bedtime as well as sliding scale. -Decrease Lantus to 4 units -Sliding scale insulin and  History of DVT--R-IJ  -Patient on Coumadin INR 2.66 -Pharmacy to dose Coumadin -R IJ DVT in 3/17--from prior HD catheter--removed previously --as the patient has had approximately 3 months anticoagulation and her R-IJ catheter had been removed; do not plan to restart coumadin at this point  Hypertension -Blood pressure somewhat soft in the emergency department -continue beta blocker with parameters -Monitor blood pressure  CAD  -was on aspirin and Plavix in the past. Was held due to hematuria. No chest pain. EKG with no acute changes -Continue statin  Nonsustained Vtach -keep K > 4 and Mag > 2 -am BMP   Disposition Plan: Home in 2-3days  Family Communication: Sister updated at beside 6/11  Consultants: Nephrology  Code Status: FULL   Subjective: Abdominal pain is a little bit better today. She continues to have many loose stools with occasional hematochezia. She denies any fevers, chills, chest pain, nausea, vomiting, headache, neck pain. Denies any rashes.  Objective: Filed Vitals:   05/07/16 2120  05/08/16 0454 05/08/16 0455 05/08/16 0826  BP: 101/52 99/55  110/66  Pulse: 84 79  75    Temp: 99.2 F (37.3 C) 98.5 F (36.9 C)    TempSrc:      Resp: 21 18    Weight:   98 kg (216 lb 0.8 oz)   SpO2: 94% 99%      Intake/Output Summary (Last 24 hours) at 05/08/16 1045 Last data filed at 05/07/16 1700  Gross per 24 hour  Intake    240 ml  Output      0 ml  Net    240 ml   Weight change: -2.3 kg (-5 lb 1.1 oz) Exam:   General:  Pt is alert, follows commands appropriately, not in acute distress  HEENT: No icterus, No thrush, No neck mass, [place]/AT  Cardiovascular: RRR, S1/S2, no rubs, no gallops  Respiratory: CTA bilaterally, no wheezing, no crackles, no rhonchi  Abdomen: Soft/+BS, Periumbilical tenderness without any rebound, non distended, no guarding  Extremities: trace LE edema, No lymphangitis, No petechiae, No rashes, no synovitis   Data Reviewed: I have personally reviewed following labs and imaging studies Basic Metabolic Panel:  Recent Labs Lab 05/06/16 1240 05/06/16 2254 05/07/16 0528 05/08/16 0436  NA 136 135 136 137  K 3.9 4.1 3.5 3.5  CL 102 101 100* 103  CO2 25 22 25 25   GLUCOSE 104* 82 63* 76  BUN 44* 46* 22* 28*  CREATININE 8.08* 8.26* 5.05* 6.52*  CALCIUM 8.7* 8.4* 8.4* 8.0*  MG  --   --   --  1.9  PHOS 4.5 4.6  --   --    Liver Function Tests:  Recent Labs Lab 05/06/16 1240 05/06/16 2254  AST 18  --   ALT 11*  --   ALKPHOS 180*  --   BILITOT 1.5*  --   PROT 7.3  --   ALBUMIN 2.4* 2.3*    Recent Labs Lab 05/06/16 1240  LIPASE 37   No results for input(s): AMMONIA in the last 168 hours. Coagulation Profile:  Recent Labs Lab 05/06/16 1240 05/07/16 0528 05/08/16 0436  INR 2.66* 2.65* 2.62*   CBC:  Recent Labs Lab 05/06/16 1240 05/06/16 2250 05/07/16 0528 05/08/16 0436  WBC 5.3 5.1 5.5 5.5  NEUTROABS 3.3  --   --   --   HGB 9.3* 9.5* 9.4* 9.2*  HCT 29.8* 30.1* 30.3* 29.3*  MCV 85.4 86.0 85.1 85.9  PLT 268 225 219 216   Cardiac Enzymes: No results for input(s): CKTOTAL, CKMB, CKMBINDEX, TROPONINI  in the last 168 hours. BNP: Invalid input(s): POCBNP CBG:  Recent Labs Lab 05/07/16 1217 05/07/16 1704 05/07/16 2105 05/08/16 0743 05/08/16 0934  GLUCAP 96 101* 100* 63* 92   HbA1C: No results for input(s): HGBA1C in the last 72 hours. Urine analysis:    Component Value Date/Time   COLORURINE AMBER* 03/14/2016 1655   APPEARANCEUR CLOUDY* 03/14/2016 1655   LABSPEC 1.019 03/14/2016 1655   PHURINE 5.0 03/14/2016 1655   GLUCOSEU NEGATIVE 03/14/2016 1655   HGBUR LARGE* 03/14/2016 1655   BILIRUBINUR SMALL* 03/14/2016 1655   KETONESUR 15* 03/14/2016 1655   PROTEINUR 30* 03/14/2016 1655   NITRITE NEGATIVE 03/14/2016 1655   LEUKOCYTESUR TRACE* 03/14/2016 1655   Sepsis Labs: @LABRCNTIP (procalcitonin:4,lacticidven:4) ) Recent Results (from the past 240 hour(s))  C difficile quick scan w PCR reflex     Status: Abnormal   Collection Time: 05/06/16 10:47 PM  Result Value Ref Range Status   C  Diff antigen POSITIVE (A) NEGATIVE Final   C Diff toxin POSITIVE (A) NEGATIVE Final   C Diff interpretation Positive for toxigenic C. difficile  Final    Comment: CRITICAL RESULT CALLED TO, READ BACK BY AND VERIFIED WITH: KAMMIE MOORE,RN @0616  05/07/16 MKELLY   Gastrointestinal Panel by PCR , Stool     Status: None   Collection Time: 05/06/16 10:47 PM  Result Value Ref Range Status   Campylobacter species NOT DETECTED NOT DETECTED Final   Plesimonas shigelloides NOT DETECTED NOT DETECTED Final   Salmonella species NOT DETECTED NOT DETECTED Final   Yersinia enterocolitica NOT DETECTED NOT DETECTED Final   Vibrio species NOT DETECTED NOT DETECTED Final   Vibrio cholerae NOT DETECTED NOT DETECTED Final   Enteroaggregative E coli (EAEC) NOT DETECTED NOT DETECTED Final   Enteropathogenic E coli (EPEC) NOT DETECTED NOT DETECTED Final   Enterotoxigenic E coli (ETEC) NOT DETECTED NOT DETECTED Final   Shiga like toxin producing E coli (STEC) NOT DETECTED NOT DETECTED Final   E. coli O157 NOT  DETECTED NOT DETECTED Final   Shigella/Enteroinvasive E coli (EIEC) NOT DETECTED NOT DETECTED Final   Cryptosporidium NOT DETECTED NOT DETECTED Final   Cyclospora cayetanensis NOT DETECTED NOT DETECTED Final   Entamoeba histolytica NOT DETECTED NOT DETECTED Final   Giardia lamblia NOT DETECTED NOT DETECTED Final   Adenovirus F40/41 NOT DETECTED NOT DETECTED Final   Astrovirus NOT DETECTED NOT DETECTED Final   Norovirus GI/GII NOT DETECTED NOT DETECTED Final   Rotavirus A NOT DETECTED NOT DETECTED Final   Sapovirus (I, II, IV, and V) NOT DETECTED NOT DETECTED Final     Scheduled Meds: . atorvastatin  10 mg Oral Daily  . budesonide  0.5 mg Nebulization BID  . carvedilol  3.125 mg Oral BID WC  . famotidine  20 mg Oral Daily  . heparin  3,100 Units Dialysis Once in dialysis  . insulin aspart  0-5 Units Subcutaneous QHS  . insulin aspart  0-9 Units Subcutaneous TID WC  . insulin glargine  8 Units Subcutaneous QHS  . multivitamin  1 tablet Oral QHS  . saccharomyces boulardii  250 mg Oral BID  . sodium chloride flush  3 mL Intravenous Q12H  . sodium chloride flush  3 mL Intravenous Q12H  . vancomycin  125 mg Oral Q6H   Continuous Infusions:   Procedures/Studies: Ct Abdomen Pelvis Wo Contrast  05/06/2016  CLINICAL DATA:  Patient has had Celsius diff and diarrhea for 30 days. Increasing mid abdominal pain. EXAM: CT ABDOMEN AND PELVIS WITHOUT CONTRAST TECHNIQUE: Multidetector CT imaging of the abdomen and pelvis was performed following the standard protocol without IV contrast. COMPARISON:  None. FINDINGS: A dialysis catheter, entering via a left femoral approach, terminates in the right atrium. Cardiomegaly is identified. Small bilateral effusions are identified with underlying atelectasis. No other lung base abnormalities. Increased attenuation in the subcutaneous fat is consistent with volume overload. Ascites is seen in the abdomen. No free air. Evaluation of the colon is limited due to  lack of distention with contrast. However, given the history of Clostridium difficile, there does appear to be colonic wall thickening extending from the distal transverse colon through the descending colon. There may also be thickening of the colon wall in the ascending and proximal transverse colon. The stomach and small bowel are unremarkable. Cholelithiasis is seen in the gallbladder. The liver, spleen, adrenal glands, and pancreas are unremarkable. The kidneys are unremarkable. No suspicious adenopathy. The pelvis demonstrates a left  femoral line. No suspicious masses or adenopathy. Free fluid identified. No acute bony abnormalities. IMPRESSION: 1. The study is limited due to lack of contrast in addition to volume overload resulting in increased attenuation in the fat diffusely, pleural effusions, and ascites. 2. Evaluation of the colon is limited but I do suspect colonic wall thickening as described above consistent with the history of Clostridium difficile. 3. No other acute abnormalities. Electronically Signed   By: Gerome Sam III M.D   On: 05/06/2016 14:13    Kishana Battey, DO  [facility] Pager [phone]  If 7PM-7AM, please contact night-coverage [email or web address] Password [email or web address] 05/08/2016, 10:45 AM   LOS: 2 days

## 2016-05-08 NOTE — Progress Notes (Signed)
[facility] Progress Note    Subjective:  "My stomach feels like it's on fire". Reports blood tinged diarrhea. No other issues.   Objective Filed Vitals:   05/07/16 2120 05/08/16 0454 05/08/16 0455 05/08/16 0826  BP: 101/52 99/55  110/66  Pulse: 84 79  75  Temp: 99.2 F (37.3 C) 98.5 F (36.9 C)    TempSrc:      Resp: 21 18    Weight:   98 kg (216 lb 0.8 oz)   SpO2: 94% 99%     Physical Exam General: Well nourished, pleasant, NAD Heart: S1, S2, RRR Lungs: Bilateral breath sounds CTA A/P Abdomen: Active BS. Tenderness LQs/periumbilical. Umbilical hernia present.  Extremities: Mild ankle edema at present bilaterally.  Dialysis Access: LIJ TDC Drsg intact maturing LUA AVF + bruit  [facility]  4h  101kg   3/2.25 bath  IJ cath  Hep 3100 Mircera 150 mcg IV q 2 weeks (Last dose 05/02/16) Venofer 100 mg IV treatment X 10 (3 doses remaining)  Assessment/Plan: 1. C Diff colitis: Per primary. C Diff Positive-on vanc per primary. Enteric isolation.  2. Hematochrezia: Per primary-probably R/T C diff. Follow HGB. Hold heparin in HD tomorrow.  3. ESRD - New start at [facility]. Missed HD Tues/Thurs had HD Sat. 06/10. Next tx tomorrow on schedule. K+ 3.5 cont 3.0 K bath.  4. Hypertension/volume - OP med list not available in [employer].SBP 100-120s in ED. Continue Coreg per primary. HD 06/10 pre wt 100.3 Net UF 1500 Post wt 99 kgs. Now under OP EDW. Lower slightly on DC. BP soft.  Attempt UFG 0.5-1 liter tomorrow.  5. Anemia - HGB 9.2 Last ESA dose 05/02/16. Has been being loaded with Fe. Tsat 15 04/11/16. Will continue Fe load unless patient has signs of sepsis.  6. Metabolic bone disease - No Binders/VRDA in ecube. Check renal profile with HD. Phos 4.7 Ca 9.2 C Ca 9.8 PTH 112 (04/11/16) 7. Nutrition - Albumin 2.3. Change to renal/Carb mod diet (K+ 3.5)  renal vit/prostat. 8. DM: per primary 8. Chronic Systolic HF: Cont coreg, monitor volume.  S/P AICD.  Per primary 9. COPD: Per primary.     Rita H. Brown NP-C 05/08/2016, 10:28 AM  [facility] [phone]  Pt seen, examined and agree w A/P as above.  Vinson Moselle MD [facility] pager [phone]    cell [phone] 05/08/2016, 2:44 PM    Additional Objective Labs: Basic Metabolic Panel:  Recent Labs Lab 05/06/16 1240 05/06/16 2254 05/07/16 0528 05/08/16 0436  NA 136 135 136 137  K 3.9 4.1 3.5 3.5  CL 102 101 100* 103  CO2 GLUCOSE 104* 82 63* 76  BUN 44* 46* 22* 28*  CREATININE 8.08* 8.26* 5.05* 6.52*  CALCIUM 8.7* 8.4* 8.4* 8.0*  PHOS 4.5 4.6  --   --    Liver Function Tests:  Recent Labs Lab 05/06/16 1240 05/06/16 2254  AST 18  --   ALT 11*  --   ALKPHOS 180*  --   BILITOT 1.5*  --   PROT 7.3  --   ALBUMIN 2.4* 2.3*    Recent Labs Lab 05/06/16 1240  LIPASE 37   CBC:  Recent Labs Lab 05/06/16 1240 05/06/16 2250 05/07/16 0528 05/08/16 0436  WBC 5.3 5.1 5.5 5.5  NEUTROABS 3.3  --   --   --   HGB 9.3* 9.5* 9.4* 9.2*  HCT 29.8* 30.1* 30.3* 29.3*  MCV 85.4 86.0 85.1 85.9  PLT 268 225 219 216   Blood Culture    Component Value Date/Time   SDES URINE, CATHETERIZED 03/21/2016 0109   SPECREQUEST NONE 03/21/2016 0109   CULT MULTIPLE SPECIES PRESENT, SUGGEST RECOLLECTION* 03/21/2016 0109   REPTSTATUS 03/22/2016 FINAL 03/21/2016 0109    Cardiac Enzymes: No results for input(s): CKTOTAL, CKMB, CKMBINDEX, TROPONINI in the last 168 hours. CBG:  Recent Labs Lab 05/07/16 1217 05/07/16 1704 05/07/16 2105 05/08/16 0743 05/08/16 0934  GLUCAP 96 101* 100* 63* 92   Iron Studies: No results for input(s): IRON, TIBC, TRANSFERRIN, FERRITIN in the last 72 hours. @lablastinr3 @ Studies/Results: Ct Abdomen Pelvis Wo Contrast  05/06/2016  CLINICAL DATA:  Patient has had Celsius diff and diarrhea for 30 days. Increasing mid abdominal pain. EXAM: CT ABDOMEN AND PELVIS WITHOUT CONTRAST TECHNIQUE:  Multidetector CT imaging of the abdomen and pelvis was performed following the standard protocol without IV contrast. COMPARISON:  None. FINDINGS: A dialysis catheter, entering via a left femoral approach, terminates in the right atrium. Cardiomegaly is identified. Small bilateral effusions are identified with underlying atelectasis. No other lung base abnormalities. Increased attenuation in the subcutaneous fat is consistent with volume overload. Ascites is seen in the abdomen. No free air. Evaluation of the colon is limited due to lack of distention with contrast. However, given the history of Clostridium difficile, there does appear to be colonic wall thickening extending from the distal transverse colon through the descending colon. There may also be thickening of the colon wall in the ascending and proximal transverse colon. The stomach and small bowel are unremarkable. Cholelithiasis is seen in the gallbladder. The liver, spleen, adrenal glands, and pancreas are unremarkable. The kidneys are unremarkable. No suspicious adenopathy. The pelvis demonstrates a left femoral line. No suspicious masses or adenopathy. Free fluid identified. No acute bony abnormalities. IMPRESSION: 1. The study is limited due to lack of contrast in addition to volume overload resulting in increased attenuation in the fat diffusely, pleural effusions, and ascites. 2. Evaluation of the colon is limited but I do suspect colonic wall thickening as described above consistent with the history of Clostridium difficile. 3. No other acute abnormalities. Electronically Signed   By: Gerome Sam III M.D   On: 05/06/2016 14:13   Medications:   . atorvastatin  10 mg Oral Daily  . budesonide  0.5 mg Nebulization BID  . carvedilol  3.125 mg Oral BID WC  . famotidine  20 mg Oral Daily  . heparin  3,100 Units Dialysis Once in dialysis  . insulin aspart  0-5 Units Subcutaneous QHS  . insulin aspart  0-9 Units Subcutaneous TID WC  . insulin  glargine  8 Units Subcutaneous QHS  . multivitamin  1 tablet Oral QHS  . saccharomyces boulardii  250 mg Oral BID  . sodium chloride flush  3 mL Intravenous Q12H  . sodium chloride flush  3 mL Intravenous Q12H  . vancomycin  125 mg Oral Q6H

## 2016-05-09 LAB — CBC
HCT: 30.9 % — ABNORMAL LOW (ref 36.0–46.0)
HEMATOCRIT: 28.2 % — AB (ref 36.0–46.0)
HEMOGLOBIN: 8.9 g/dL — AB (ref 12.0–15.0)
Hemoglobin: 9.4 g/dL — ABNORMAL LOW (ref 12.0–15.0)
MCH: 27.1 pg (ref 26.0–34.0)
MCH: 26.7 pg (ref 26.0–34.0)
MCHC: 31.6 g/dL (ref 30.0–36.0)
MCHC: 30.4 g/dL (ref 30.0–36.0)
MCV: 85.7 fL (ref 78.0–100.0)
MCV: 87.8 fL (ref 78.0–100.0)
Platelets: 223 10*3/uL (ref 150–400)
Platelets: 219 10*3/uL (ref 150–400)
RBC: 3.29 MIL/uL — ABNORMAL LOW (ref 3.87–5.11)
RBC: 3.52 MIL/uL — ABNORMAL LOW (ref 3.87–5.11)
RDW: 17.5 % — ABNORMAL HIGH (ref 11.5–15.5)
RDW: 17.6 % — ABNORMAL HIGH (ref 11.5–15.5)
WBC: 6.1 10*3/uL (ref 4.0–10.5)
WBC: 6.6 10*3/uL (ref 4.0–10.5)

## 2016-05-09 LAB — RENAL FUNCTION PANEL
Albumin: 2.2 g/dL — ABNORMAL LOW (ref 3.5–5.0)
Anion gap: 9 (ref 5–15)
BUN: 33 mg/dL — ABNORMAL HIGH (ref 6–20)
CO2: 26 mmol/L (ref 22–32)
Calcium: 8.2 mg/dL — ABNORMAL LOW (ref 8.9–10.3)
Chloride: 102 mmol/L (ref 101–111)
Creatinine, Ser: 7.34 mg/dL — ABNORMAL HIGH (ref 0.44–1.00)
GFR calc Af Amer: 6 mL/min — ABNORMAL LOW (ref >60)
GFR calc non Af Amer: 5 mL/min — ABNORMAL LOW (ref >60)
Glucose, Bld: 104 mg/dL — ABNORMAL HIGH (ref 65–99)
Phosphorus: 4 mg/dL (ref 2.5–4.6)
Potassium: 5.6 mmol/L — ABNORMAL HIGH (ref 3.5–5.1)
Sodium: 137 mmol/L (ref 135–145)

## 2016-05-09 LAB — HEPATITIS B SURFACE ANTIGEN: Hepatitis B Surface Ag: NEGATIVE

## 2016-05-09 LAB — GLUCOSE, CAPILLARY
GLUCOSE-CAPILLARY: 83 mg/dL (ref 65–99)
Glucose-Capillary: 133 mg/dL — ABNORMAL HIGH (ref 65–99)
Glucose-Capillary: 165 mg/dL — ABNORMAL HIGH (ref 65–99)

## 2016-05-09 MED ORDER — SODIUM CHLORIDE 0.9 % IV SOLN: 100.0000 mL | INTRAVENOUS | Status: DC | PRN

## 2016-05-09 MED ORDER — INSULIN GLARGINE 100 UNITS/ML SOLOSTAR PEN: 10.0000 [IU] | PEN_INJECTOR | Freq: Every day | SUBCUTANEOUS | Status: DC

## 2016-05-09 MED ORDER — VANCOMYCIN 50 MG/ML ORAL SOLUTION: 125.0000 mg | Freq: Four times a day (QID) | ORAL | Status: DC

## 2016-05-09 NOTE — Progress Notes (Signed)
[facility] Progress Note  Subjective:  Diarrhea "not as bad" last night and today so far, no new c/o  Objective Filed Vitals:   05/09/16 0730 05/09/16 0800 05/09/16 0830 05/09/16 0859  BP: 107/67 102/63 109/58 110/64  Pulse: 77 80 76 78  Temp:      TempSrc:      Resp: Weight:      SpO2:       Physical Exam General: Well nourished, pleasant, NAD Heart: S1, S2, RRR Lungs: Bilateral breath sounds CTA A/P Abdomen: Active BS. Tenderness LQs/periumbilical. Umbilical hernia present.  Extremities: Mild ankle edema at present bilaterally.  Dialysis Access: LIJ TDC Drsg intact maturing LUA AVF + bruit  [facility]  4h  101kg   3/2.25 bath  IJ cath  Hep 3100 Mircera 150 mcg IV q 2 weeks (Last dose 05/02/16) Venofer 100 mg IV treatment X 10 (3 doses remaining)  Assessment 1  Cdif infection/ diarrhea, improving 2  BRBPR 3  ESRD recent start 4  HTN on coreg 5  Vol under dry 6  DM per prim 7  CM / ICD   Plan - HD today, min UF     Additional Objective Labs: Basic Metabolic Panel:  Recent Labs Lab 05/06/16 1240 05/06/16 2254 05/07/16 0528 05/08/16 0436 05/09/16 0724  NA 136 135 136 137 137  K 3.9 4.1 3.5 3.5 5.6*  CL 102 101 100* 103 102  CO2 GLUCOSE 104* 82 63* 76 104*  BUN 44* 46* 22* 28* 33*  CREATININE 8.08* 8.26* 5.05* 6.52* 7.34*  CALCIUM 8.7* 8.4* 8.4* 8.0* 8.2*  PHOS 4.5 4.6  --   --  4.0   Liver Function Tests:  Recent Labs Lab 05/06/16 1240 05/06/16 2254 05/09/16 0724  AST 18  --   --   ALT 11*  --   --   ALKPHOS 180*  --   --   BILITOT 1.5*  --   --   PROT 7.3  --   --   ALBUMIN 2.4* 2.3* 2.2*    Recent Labs Lab 05/06/16 1240  LIPASE 37   CBC:  Recent Labs Lab 05/06/16 1240 05/06/16 2250 05/07/16 0528 05/08/16 0436 05/09/16 0343 05/09/16 0724  WBC 5.3 5.1 5.5 5.5 6.1 6.6  NEUTROABS 3.3  --   --   --   --   --   HGB 9.3* 9.5* 9.4* 9.2* 8.9* 9.4*  HCT 29.8* 30.1* 30.3* 29.3*  28.2* 30.9*  MCV 85.4 86.0 85.1 85.9 85.7 87.8  PLT 268 225 219 216 223 219   Blood Culture    Component Value Date/Time   SDES URINE, CATHETERIZED 03/21/2016 0109   SPECREQUEST NONE 03/21/2016 0109   CULT MULTIPLE SPECIES PRESENT, SUGGEST RECOLLECTION* 03/21/2016 0109   REPTSTATUS 03/22/2016 FINAL 03/21/2016 0109    Cardiac Enzymes: No results for input(s): CKTOTAL, CKMB, CKMBINDEX, TROPONINI in the last 168 hours. CBG:  Recent Labs Lab 05/08/16 0743 05/08/16 0934 05/08/16 1211 05/08/16 1603 05/08/16 1933  GLUCAP 63* 92 100* 108* 131*   Iron Studies: No results for input(s): IRON, TIBC, TRANSFERRIN, FERRITIN in the last 72 hours. @ Studies/Results: No results found. Medications:   . atorvastatin  10 mg Oral Daily  . budesonide  0.5 mg Nebulization BID  . carvedilol  3.125 mg Oral BID WC  . famotidine  20 mg Oral Daily  . heparin  3,100 Units Dialysis Once in dialysis  .  insulin aspart  0-5 Units Subcutaneous QHS  . insulin aspart  0-9 Units Subcutaneous TID WC  . insulin glargine  4 Units Subcutaneous QHS  . multivitamin  1 tablet Oral QHS  . saccharomyces boulardii  250 mg Oral BID  . sodium chloride flush  3 mL Intravenous Q12H  . sodium chloride flush  3 mL Intravenous Q12H  . vancomycin  125 mg Oral Q6H

## 2016-05-09 NOTE — Progress Notes (Signed)
Physician Discharge Summary  Brandy Dudley GNF:[id number] DOB: [birth year] DOA: 05/06/2016  PCP: [name], MD  Admit date: 05/06/2016 Discharge date: 05/10/16 Admitted From: [name] Disposition:  [name]  Recommendations for Outpatient Follow-up:  1. Follow up with PCP in 1-2 weeks 2. Please obtain BMP/CBC in one week    Home Health:NO Equipment/Devices:NO  Discharge Condition:Stable CODE STATUS:FULL Diet recommendation: Carb Modified   Brief/Interim Summary   Discharge Diagnoses:  C.diff colitis -Initially developed C. difficile colitis on 03/21/2016 secondary to treatment with clindamycin for breast abscess -05/06/16--CT abd colonic wall thickening consistent with history of C. difficile colitis as well as cholelithiasis.. Reports 4-5 loose stools daily. -enteric precautions -Continue oral vancomycin x 11 more days to complete 14 days of therapy  Hematochezia -likely due to C. Diff -Patient has small amounts of hematochezia with her bowel movements-->improved -hemodynamically stable -hold coumadin without reversal--as the patient has had approximately 3 months anticoagulation and her R-IJ catheter had been removed; do not plan to restart coumadin at this point -allow INR to trend down on its own for now -trend Hgb--hemoglobin remained stable -Anticipate improvement of hematochezia with continued treatment of C. difficile colitis  ESRD -Patient is a Tuesday Thursday Saturday dialysis patient she has missed the last 2 scheduled visits due to persistent diarrhea prior to admit.  -Appreciate Nephrology consult -Continue home meds  Chronic systolic and diastolic heart failure.  -03/21/16 echo EF of 35-40% grade 2 diastolic dysfunction moderate dilated RV, mild LAE, moderate RAE, severe TR.  -Continue Coreg -Monitor daily weights -Monitor intake and output -Dialysis per nephrology -s/p AICD  COPD -Appears stable at baseline.  -Continue home  nebulizers -stable on RA -Former smoker, quit 10 years ago  Diabetes mellitus, type 2, controlled -03/17/2016 hemoglobin A1c 6.4 -decrease home lantus dose to 10 units qhs -Decrease Lantus to 4 units -Sliding scale insulin  History of DVT--R-IJ  -Patient on Coumadin INR 2.66 -Pharmacy to dose Coumadin -R IJ DVT in 3/17--from prior HD catheter--removed previously --as the patient has had approximately 3 months anticoagulation and her R-IJ catheter had been removed; do not plan to restart coumadin at this point  Hypertension -Blood pressure somewhat soft in the emergency department -continue beta blocker with parameters -Monitor blood pressure  CAD  -was on aspirin and Plavix in the past. Was held due to hematuria. No chest pain. EKG with no acute changes -Continue statin  Nonsustained Vtach -keep K > 4 and Mag > 2 -am BMP   Discharge Instructions     Medication List    STOP taking these medications        warfarin 6 MG tablet  Commonly known as:  COUMADIN      TAKE these medications        acetaminophen 325 MG tablet  Commonly known as:  TYLENOL  Take 325 mg by mouth every 6 (six) hours as needed for mild pain.     PROAIR HFA 108 (90 Base) MCG/ACT inhaler  Generic drug:  albuterol  Inhale 2 puffs into the lungs 4 (four) times daily as needed for wheezing or shortness of breath.     albuterol (2.5 MG/3ML) 0.083% nebulizer solution  Commonly known as:  PROVENTIL  Take 3 mLs (2.5 mg total) by nebulization every 6 (six) hours as needed for wheezing or shortness of breath.     atorvastatin 10 MG tablet  Commonly known as:  LIPITOR  Take 10 mg by mouth at bedtime.     budesonide 0.5 MG/2ML  nebulizer solution  Commonly known as:  PULMICORT  Take 2 mLs (0.5 mg total) by nebulization 2 (two) times daily.     carvedilol 3.125 MG tablet  Commonly known as:  COREG  Take 1 tablet (3.125 mg total) by mouth 2 (two) times daily with a meal.      CERTAVITE/ANTIOXIDANTS Tabs  Take 1 tablet by mouth at bedtime.     famotidine 20 MG tablet  Commonly known as:  PEPCID  Take 1 tablet (20 mg total) by mouth daily.     FIBER CHOICE PO  Take 2 tablets by mouth daily as needed (constipation).     hydrOXYzine 50 MG tablet  Commonly known as:  ATARAX/VISTARIL  Take 50 mg by mouth every 6 (six) hours as needed for itching.     insulin glargine 100 unit/mL Sopn  Commonly known as:  LANTUS  Inject 0.1 mLs (10 Units total) into the skin at bedtime.     insulin lispro 100 UNIT/ML injection  Commonly known as:  HUMALOG  Inject 5 Units into the skin 3 (three) times daily as needed for high blood sugar (CBG >180).     LORazepam 0.5 MG tablet  Commonly known as:  ATIVAN  Take 0.5 mg by mouth 2 (two) times daily as needed for anxiety.     OXYGEN  Inhale 2 L into the lungs at bedtime.     saccharomyces boulardii 250 MG capsule  Commonly known as:  FLORASTOR  Take 250 mg by mouth 2 (two) times daily. For recurrent C. Diff     vancomycin 50 mg/mL oral solution  Commonly known as:  VANCOCIN  Take 2.5 mLs (125 mg total) by mouth every 6 (six) hours. X 11 more days        Allergies  Allergen Reactions  . Entresto [Sacubitril-Valsartan] Other (See Comments)    Not listed on [facility] 05/06/16 - unknown reaction  . Sulfa Antibiotics Itching    Consultations:  nephrology   Procedures/Studies: Ct Abdomen Pelvis Wo Contrast  05/06/2016  CLINICAL DATA:  Patient has had Celsius diff and diarrhea for 30 days. Increasing mid abdominal pain. EXAM: CT ABDOMEN AND PELVIS WITHOUT CONTRAST TECHNIQUE: Multidetector CT imaging of the abdomen and pelvis was performed following the standard protocol without IV contrast. COMPARISON:  None. FINDINGS: A dialysis catheter, entering via a left femoral approach, terminates in the right atrium. Cardiomegaly is identified. Small bilateral effusions are identified with underlying atelectasis. No other lung base  abnormalities. Increased attenuation in the subcutaneous fat is consistent with volume overload. Ascites is seen in the abdomen. No free air. Evaluation of the colon is limited due to lack of distention with contrast. However, given the history of Clostridium difficile, there does appear to be colonic wall thickening extending from the distal transverse colon through the descending colon. There may also be thickening of the colon wall in the ascending and proximal transverse colon. The stomach and small bowel are unremarkable. Cholelithiasis is seen in the gallbladder. The liver, spleen, adrenal glands, and pancreas are unremarkable. The kidneys are unremarkable. No suspicious adenopathy. The pelvis demonstrates a left femoral line. No suspicious masses or adenopathy. Free fluid identified. No acute bony abnormalities. IMPRESSION: 1. The study is limited due to lack of contrast in addition to volume overload resulting in increased attenuation in the fat diffusely, pleural effusions, and ascites. 2. Evaluation of the colon is limited but I do suspect colonic wall thickening as described above consistent with the history of Clostridium  difficile. 3. No other acute abnormalities. Electronically Signed   By: Gerome Sam III M.D   On: 05/06/2016 14:13        Discharge Exam: Filed Vitals:   05/09/16 1100 05/09/16 1600  BP: 110/64 104/58  Pulse: 81 79  Temp: 98.1 F (36.7 C) 98.4 F (36.9 C)  Resp: 17 21   Filed Vitals:   05/09/16 1028 05/09/16 1044 05/09/16 1100 05/09/16 1600  BP: 113/68 111/66 110/64 104/58  Pulse: 80 79 81 79  Temp:   98.1 F (36.7 C) 98.4 F (36.9 C)  TempSrc:      Resp: Height:   (1.727 m)    Weight:  98.2 kg (216 lb 7.9 oz)    SpO2:  99% 100% 100%    General: Pt is alert, awake, not in acute distress Cardiovascular: RRR, S1/S2 +, no rubs, no gallops Respiratory: CTA bilaterally, no wheezing, no rhonchi Abdominal: Soft, NT, ND, bowel sounds  + Extremities: 1 + LE edema, no cyanosis   The results of significant diagnostics from this hospitalization (including imaging, microbiology, ancillary and laboratory) are listed below for reference.    Significant Diagnostic Studies: Ct Abdomen Pelvis Wo Contrast  05/06/2016  CLINICAL DATA:  Patient has had Celsius diff and diarrhea for 30 days. Increasing mid abdominal pain. EXAM: CT ABDOMEN AND PELVIS WITHOUT CONTRAST TECHNIQUE: Multidetector CT imaging of the abdomen and pelvis was performed following the standard protocol without IV contrast. COMPARISON:  None. FINDINGS: A dialysis catheter, entering via a left femoral approach, terminates in the right atrium. Cardiomegaly is identified. Small bilateral effusions are identified with underlying atelectasis. No other lung base abnormalities. Increased attenuation in the subcutaneous fat is consistent with volume overload. Ascites is seen in the abdomen. No free air. Evaluation of the colon is limited due to lack of distention with contrast. However, given the history of Clostridium difficile, there does appear to be colonic wall thickening extending from the distal transverse colon through the descending colon. There may also be thickening of the colon wall in the ascending and proximal transverse colon. The stomach and small bowel are unremarkable. Cholelithiasis is seen in the gallbladder. The liver, spleen, adrenal glands, and pancreas are unremarkable. The kidneys are unremarkable. No suspicious adenopathy. The pelvis demonstrates a left femoral line. No suspicious masses or adenopathy. Free fluid identified. No acute bony abnormalities. IMPRESSION: 1. The study is limited due to lack of contrast in addition to volume overload resulting in increased attenuation in the fat diffusely, pleural effusions, and ascites. 2. Evaluation of the colon is limited but I do suspect colonic wall thickening as described above consistent with the history of  Clostridium difficile. 3. No other acute abnormalities. Electronically Signed   By: Gerome Sam III M.D   On: 05/06/2016 14:13     Microbiology: Recent Results (from the past 240 hour(s))  C difficile quick scan w PCR reflex     Status: Abnormal   Collection Time: 05/06/16 10:47 PM  Result Value Ref Range Status   C Diff antigen POSITIVE (A) NEGATIVE Final   C Diff toxin POSITIVE (A) NEGATIVE Final   C Diff interpretation Positive for toxigenic C. difficile  Final    Comment: CRITICAL RESULT CALLED TO, READ BACK BY AND VERIFIED WITH: KAMMIE MOORE,RN  05/07/16 MKELLY   Gastrointestinal Panel by PCR , Stool     Status: None   Collection Time: 05/06/16 10:47 PM  Result Value Ref Range Status  Campylobacter species NOT DETECTED NOT DETECTED Final   Plesimonas shigelloides NOT DETECTED NOT DETECTED Final   Salmonella species NOT DETECTED NOT DETECTED Final   Yersinia enterocolitica NOT DETECTED NOT DETECTED Final   Vibrio species NOT DETECTED NOT DETECTED Final   Vibrio cholerae NOT DETECTED NOT DETECTED Final   Enteroaggregative E coli (EAEC) NOT DETECTED NOT DETECTED Final   Enteropathogenic E coli (EPEC) NOT DETECTED NOT DETECTED Final   Enterotoxigenic E coli (ETEC) NOT DETECTED NOT DETECTED Final   Shiga like toxin producing E coli (STEC) NOT DETECTED NOT DETECTED Final   E. coli O157 NOT DETECTED NOT DETECTED Final   Shigella/Enteroinvasive E coli (EIEC) NOT DETECTED NOT DETECTED Final   Cryptosporidium NOT DETECTED NOT DETECTED Final   Cyclospora cayetanensis NOT DETECTED NOT DETECTED Final   Entamoeba histolytica NOT DETECTED NOT DETECTED Final   Giardia lamblia NOT DETECTED NOT DETECTED Final   Adenovirus F40/41 NOT DETECTED NOT DETECTED Final   Astrovirus NOT DETECTED NOT DETECTED Final   Norovirus GI/GII NOT DETECTED NOT DETECTED Final   Rotavirus A NOT DETECTED NOT DETECTED Final   Sapovirus (I, II, IV, and V) NOT DETECTED NOT DETECTED Final     Labs: Basic  Metabolic Panel:  Recent Labs Lab 05/06/16 1240 05/06/16 2254 05/07/16 0528 05/08/16 0436 05/09/16 0724  NA 136 135 136 137 137  K 3.9 4.1 3.5 3.5 5.6*  CL 102 101 100* 103 102  CO2 25 22 25 25 26   GLUCOSE 104* 82 63* 76 104*  BUN 44* 46* 22* 28* 33*  CREATININE 8.08* 8.26* 5.05* 6.52* 7.34*  CALCIUM 8.7* 8.4* 8.4* 8.0* 8.2*  MG  --   --   --  1.9  --   PHOS 4.5 4.6  --   --  4.0   Liver Function Tests:  Recent Labs Lab 05/06/16 1240 05/06/16 2254 05/09/16 0724  AST 18  --   --   ALT 11*  --   --   ALKPHOS 180*  --   --   BILITOT 1.5*  --   --   PROT 7.3  --   --   ALBUMIN 2.4* 2.3* 2.2*    Recent Labs Lab 05/06/16 1240  LIPASE 37   No results for input(s): AMMONIA in the last 168 hours. CBC:  Recent Labs Lab 05/06/16 1240 05/06/16 2250 05/07/16 0528 05/08/16 0436 05/09/16 0343 05/09/16 0724  WBC 5.3 5.1 5.5 5.5 6.1 6.6  NEUTROABS 3.3  --   --   --   --   --   HGB 9.3* 9.5* 9.4* 9.2* 8.9* 9.4*  HCT 29.8* 30.1* 30.3* 29.3* 28.2* 30.9*  MCV 85.4 86.0 85.1 85.9 85.7 87.8  PLT 268 225 219 216 223 219   Cardiac Enzymes: No results for input(s): CKTOTAL, CKMB, CKMBINDEX, TROPONINI in the last 168 hours. BNP: Invalid input(s): POCBNP CBG:  Recent Labs Lab 05/08/16 0934 05/08/16 1211 05/08/16 1603 05/08/16 1933 05/09/16 1113  GLUCAP 92 100* 108* 131* 83    Time coordinating discharge:  Greater than 30 minutes  Signed:  Paulanthony Gleaves, DO [facility] Pager: [phone] 05/09/2016, 6:42 PM

## 2016-05-09 NOTE — NC FL2 (Signed)
[place] MEDICAID FL2 LEVEL OF CARE SCREENING TOOL     IDENTIFICATION  Patient Name: Brandy Dudley Birthdate: [birth year] Sex: female Admission Date (Current Location): 05/06/2016  [place] and [place] Number:  [address] Bast [id number] S Facility and Address:  [facility], [address], [place], [place] [address]      Provider Number: [id number]  Attending Physician Name and Address:  Catarina Hartshorn, MD  Relative Name and Phone Number:  [name] - sister. [phone]    Current Level of Care: Hospital Recommended Level of Care: Skilled Nursing Facility Prior Approval Number:    Date Approved/Denied:   PASRR Number:    Discharge Plan: SNF    Current Diagnoses: Patient Active Problem List   Diagnosis Date Noted  . Clostridium difficile colitis 05/07/2016  . Chronic combined systolic and diastolic CHF (congestive heart failure) (HCC) 05/07/2016  . Diarrhea 05/06/2016  . RUQ pain 05/06/2016  . Anemia 05/06/2016  . Acute on chronic renal failure (HCC) 05/06/2016  . 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 Anemia of chronic renal failure, stage 5 (HCC) 04/14/2016  . Pressure ulcer 04/02/2016  . ESRD on dialysis (HCC)   . Anasarca   . Bloody diarrhea   . AKI (acute kidney injury) (HCC)   . Respiratory failure (HCC)   . Acute on chronic congestive heart failure (HCC)   . Chronic kidney disease (CKD), stage IV (severe) (HCC)   . C. difficile colitis 03/20/2016  . COPD (chronic obstructive pulmonary disease) (HCC) 03/20/2016  . Chest wall abscess 03/20/2016  . Obstructive sleep apnea 03/20/2016  . AICD (automatic cardioverter/defibrillator) present 03/20/2016  . C. difficile diarrhea 03/20/2016  . Physical deconditioning 03/02/2016  . DVT (deep venous thrombosis), right 03/02/2016  . Chronic systolic congestive heart failure (HCC) 03/02/2016  . Cardiomyopathy, ischemic 03/02/2016  . Coronary artery disease involving  native coronary artery of native heart without angina pectoris 03/02/2016  . Essential hypertension, malignant 03/02/2016  . Diabetes mellitus type 2 in obese (HCC) 03/02/2016    Orientation RESPIRATION BLADDER Height & Weight     Self, Time, Situation, Place  Normal Continent Weight: 216 lb 7.9 oz (98.2 kg) (Bedscale) Height:   (172.7 cm)  BEHAVIORAL SYMPTOMS/MOOD NEUROLOGICAL BOWEL NUTRITION STATUS      Incontinent Diet (Renal/carb modified with fluid restriction)  AMBULATORY STATUS COMMUNICATION OF NEEDS Skin   Limited Assist Verbally Other (Comment) (Moisture associated skin damage to right/left buttocks)                       Personal Care Assistance Level of Assistance  Bathing, Feeding, Dressing Bathing Assistance: Limited assistance Feeding assistance: Independent Dressing Assistance: Limited assistance     Functional Limitations Info  Sight, Hearing, Speech Sight Info: Adequate Hearing Info: Adequate Speech Info: Adequate    SPECIAL CARE FACTORS FREQUENCY                       Contractures Contractures Info: Not present    Additional Factors Info  Code Status, Allergies, Insulin Sliding Scale Code Status Info: Full Allergies Info: Entresto   Insulin Sliding Scale Info: 0-5 Units at bedtime       Current Medications (05/09/2016):  This is the current hospital active medication list Current Facility-Administered Medications  Medication Dose Route Frequency Provider Last Rate Last Dose  . 0.9 %  sodium chloride infusion  250 mL Intravenous PRN Gwenyth Bender, NP      .  acetaminophen (TYLENOL) tablet 650 mg  650 mg Oral Q6H PRN Gwenyth Bender, NP   650 mg at 05/07/16 1757   Or  . acetaminophen (TYLENOL) suppository 650 mg  650 mg Rectal Q6H PRN Gwenyth Bender, NP      . albuterol (PROVENTIL) (2.5 MG/3ML) 0.083% nebulizer solution 2.5 mg  2.5 mg Nebulization Q6H PRN Gwenyth Bender, NP      . atorvastatin (LIPITOR) tablet 10 mg  10 mg Oral Daily  Gwenyth Bender, NP   10 mg at 05/09/16 1243  . budesonide (PULMICORT) nebulizer solution 0.5 mg  0.5 mg Nebulization BID Gwenyth Bender, NP   0.5 mg at 05/08/16 1947  . carvedilol (COREG) tablet 3.125 mg  3.125 mg Oral BID WC Lesle Chris Black, NP   3.125 mg at 05/09/16 1245  . diphenhydrAMINE (BENADRYL) capsule 25 mg  25 mg Oral Q8H PRN Gwenyth Bender, NP   25 mg at 05/09/16 1243  . famotidine (PEPCID) tablet 20 mg  20 mg Oral Daily Gwenyth Bender, NP   20 mg at 05/09/16 1243  . HYDROcodone-acetaminophen (NORCO/VICODIN) 5-325 MG per tablet 1-2 tablet  1-2 tablet Oral Q4H PRN Lesle Chris Black, NP      . insulin aspart (novoLOG) injection 0-5 Units  0-5 Units Subcutaneous QHS Catarina Hartshorn, MD   0 Units at 05/07/16 2200  . insulin aspart (novoLOG) injection 0-9 Units  0-9 Units Subcutaneous TID WC Catarina Hartshorn, MD   0 Units at 05/08/16 0800  . insulin glargine (LANTUS) injection 4 Units  4 Units Subcutaneous QHS Catarina Hartshorn, MD   4 Units at 05/08/16 2319  . LORazepam (ATIVAN) tablet 0.5 mg  0.5 mg Oral BID PRN Gwenyth Bender, NP   0.5 mg at 05/08/16 2319  . multivitamin (RENA-VIT) tablet 1 tablet  1 tablet Oral QHS Gwenyth Bender, NP   1 tablet at 05/08/16 2318  . ondansetron (ZOFRAN) tablet 4 mg  4 mg Oral Q6H PRN Gwenyth Bender, NP       Or  . ondansetron [facility]) injection 4 mg  4 mg Intravenous Q6H PRN Lesle Chris Black, NP      . saccharomyces boulardii (FLORASTOR) capsule 250 mg  250 mg Oral BID Gwenyth Bender, NP   250 mg at 05/09/16 1243  . sodium chloride flush (NS) 0.9 % injection 3 mL  3 mL Intravenous Q12H Gwenyth Bender, NP   3 mL at 05/09/16 1244  . sodium chloride flush (NS) 0.9 % injection 3 mL  3 mL Intravenous Q12H Gwenyth Bender, NP   3 mL at 05/08/16 2322  . sodium chloride flush (NS) 0.9 % injection 3 mL  3 mL Intravenous PRN Gwenyth Bender, NP      . vancomycin (VANCOCIN) 50 mg/mL oral solution 125 mg  125 mg Oral Q6H Catarina Hartshorn, MD   125 mg at 05/09/16 1059     Discharge Medications: Please see  discharge summary for a list of discharge medications.  Relevant Imaging Results:  Relevant Lab Results:   Additional Information Dialysis at [facility]. Patient is positive for C-diff.   Cristobal Goldmann, LCSW

## 2016-05-09 NOTE — Progress Notes (Signed)
Patient's rectal tube has came out at 2200. RN deflated and reinserted rectal tube, 45cc of NS inserted to inflate balloon. 20 minutes later patient complained that the tube has came back out. Patient verbalized that the tube "did not feel right." RN will leave tube out for the remainder of the shift and continue to monitor patient.   Veatrice Kells, RN

## 2016-05-09 NOTE — Discharge Summary (Addendum)
Physician Discharge Summary  [facility] ZOX:[id number] DOB: 1950-12-15 DOA: 05/06/2016  PCP: [name], MD    Discharge summary from 05/09/2016 prepared by Dr Tat. No changes made except for discharge date.  Admit date: 05/06/2016 Discharge date: 05/12/16 Admitted From: [facility] Disposition:  [facility]  Recommendations for Outpatient Follow-up:  1. Follow up with PCP in 1-2 weeks 2. Please obtain BMP/CBC in one week    Home Health:NO Equipment/Devices:NO  Discharge Condition:Stable CODE STATUS:FULL Diet recommendation: Carb Modified   Brief/Interim Summary 65 year old female with a history of ESRD secondary to cardiorenal syndrome, chronic systolic and diastolic CHF, C. difficile colitis, diabetes mellitus type 2, COPD, right IJ DVT, and hypertension presented with increasing diarrhea at her nursing facility. She states that the nursing facility [facility]) that she is staying at did not want to send to her to dialysis alone but they had no one to go with her. She presents to ED for evaluation of diarrhea and new onset of lower abdominal pain for past three days. C. difficile antigen and toxin were positive, and the patient was started on vancomycin po on 05/07/2016. CT of the abdomen and pelvis revealed thickening of the distal transverse to the descending colon and cholelithiasis. Nephrology was consulted to assist with management. 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 She is a former smoker, quit 10 years ago.  Discharge Diagnoses:  C.diff colitis -Initially developed C. difficile colitis on 03/21/2016 secondary to treatment with clindamycin for breast abscess -05/06/16--CT abd colonic wall thickening consistent with history of C. difficile colitis as well as cholelithiasis.. Reports 4-5 loose stools daily. -enteric precautions -Continue oral vancomycin x 11 more days to complete 14 days of therapy  Hematochezia -likely due to C.  Diff -Patient has small amounts of hematochezia with her bowel movements-->improved -hemodynamically stable -hold coumadin without reversal--as the patient has had approximately 3 months anticoagulation and her R-IJ catheter had been removed; do not plan to restart coumadin after d/c -allow INR to trend down on its own for now -trend Hgb--hemoglobin remained stable -Anticipate improvement of hematochezia with continued treatment of C. difficile colitis  ESRD -Patient is a Tuesday Thursday Saturday dialysis patient she has missed the last 2 scheduled visits due to persistent diarrhea prior to admit.  -Appreciate Nephrology consult -Continue home meds  Chronic systolic and diastolic heart failure.  -03/21/16 echo EF of 35-40% grade 2 diastolic dysfunction moderate dilated RV, mild LAE, moderate RAE, severe TR.  -Continue Coreg -Monitor daily weights -Monitor intake and output -Dialysis per nephrology -s/p AICD  COPD -Appears stable at baseline.  -Continue home nebulizers -stable on RA -Former smoker, quit 10 years ago  Diabetes mellitus, type 2, controlled -03/17/2016 hemoglobin A1c 6.4 -decrease home lantus dose to 10 units qhs -Decrease Lantus to 4 units -Sliding scale insulin  History of DVT--R-IJ  -Patient on Coumadin INR 2.66 -Pharmacy to dose Coumadin -R IJ DVT in 3/17--from prior HD catheter--removed previously --as the patient has had approximately 3 months anticoagulation and her R-IJ catheter had been removed; do not plan to restart coumadin after d/c  Hypertension -Blood pressure somewhat soft in the emergency department -continue beta blocker with parameters -Monitor blood pressure  CAD  -was on aspirin and Plavix in the past. Was held due to hematuria. No chest pain. EKG with no acute changes -Continue statin  Nonsustained Vtach -keep K > 4 and Mag > 2 -am BMP   Discharge Instructions  Medication List    STOP taking these medications         warfarin 6 MG tablet  Commonly known as:  COUMADIN      TAKE these medications        acetaminophen 325 MG tablet  Commonly known as:  TYLENOL  Take 325 mg by mouth every 6 (six) hours as needed for mild pain.     PROAIR HFA 108 (90 Base) MCG/ACT inhaler  Generic drug:  albuterol  Inhale 2 puffs into the lungs 4 (four) times daily as needed for wheezing or shortness of breath.     albuterol (2.5 MG/3ML) 0.083% nebulizer solution  Commonly known as:  PROVENTIL  Take 3 mLs (2.5 mg total) by nebulization every 6 (six) hours as needed for wheezing or shortness of breath.     atorvastatin 10 MG tablet  Commonly known as:  LIPITOR  Take 10 mg by mouth at bedtime.     budesonide 0.5 MG/2ML nebulizer solution  Commonly known as:  PULMICORT  Take 2 mLs (0.5 mg total) by nebulization 2 (two) times daily.     carvedilol 3.125 MG tablet  Commonly known as:  COREG  Take 1 tablet (3.125 mg total) by mouth 2 (two) times daily with a meal.     CERTAVITE/ANTIOXIDANTS Tabs  Take 1 tablet by mouth at bedtime.     famotidine 20 MG tablet  Commonly known as:  PEPCID  Take 1 tablet (20 mg total) by mouth daily.     FIBER CHOICE PO  Take 2 tablets by mouth daily as needed (constipation).     hydrOXYzine 50 MG tablet  Commonly known as:  ATARAX/VISTARIL  Take 50 mg by mouth every 6 (six) hours as needed for itching.     insulin glargine 100 unit/mL Sopn  Commonly known as:  LANTUS  Inject 0.1 mLs (10 Units total) into the skin at bedtime.     insulin lispro 100 UNIT/ML injection  Commonly known as:  HUMALOG  Inject 5 Units into the skin 3 (three) times daily as needed for high blood sugar (CBG >180).     LORazepam 0.5 MG tablet  Commonly known as:  ATIVAN  Take 0.5 mg by mouth 2 (two) times daily as needed for anxiety.     OXYGEN  Inhale 2 L into the lungs at bedtime.     saccharomyces boulardii 250 MG capsule  Commonly known as:  FLORASTOR  Take 250 mg by mouth 2 (two) times  daily. For recurrent C. Diff     vancomycin 50 mg/mL oral solution  Commonly known as:  VANCOCIN  Take 2.5 mLs (125 mg total) by mouth every 6 (six) hours. X 11 more days        Allergies  Allergen Reactions  . Entresto [Sacubitril-Valsartan] Other (See Comments)    Not listed on [facility] 05/06/16 - unknown reaction  . Sulfa Antibiotics Itching    Consultations:  nephrology   Procedures/Studies: Ct Abdomen Pelvis Wo Contrast  05/06/2016  CLINICAL DATA:  Patient has had Celsius diff and diarrhea for 30 days. Increasing mid abdominal pain. EXAM: CT ABDOMEN AND PELVIS WITHOUT CONTRAST TECHNIQUE: Multidetector CT imaging of the abdomen and pelvis was performed following the standard protocol without IV contrast. COMPARISON:  None. FINDINGS: A dialysis catheter, entering via a left femoral approach, terminates in the right atrium. Cardiomegaly is identified. Small bilateral effusions are identified with underlying atelectasis. No other lung base abnormalities. Increased attenuation in the subcutaneous fat  is consistent with volume overload. Ascites is seen in the abdomen. No free air. Evaluation of the colon is limited due to lack of distention with contrast. However, given the history of Clostridium difficile, there does appear to be colonic wall thickening extending from the distal transverse colon through the descending colon. There may also be thickening of the colon wall in the ascending and proximal transverse colon. The stomach and small bowel are unremarkable. Cholelithiasis is seen in the gallbladder. The liver, spleen, adrenal glands, and pancreas are unremarkable. The kidneys are unremarkable. No suspicious adenopathy. The pelvis demonstrates a left femoral line. No suspicious masses or adenopathy. Free fluid identified. No acute bony abnormalities. IMPRESSION: 1. The study is limited due to lack of contrast in addition to volume overload resulting in increased attenuation in the fat  diffusely, pleural effusions, and ascites. 2. Evaluation of the colon is limited but I do suspect colonic wall thickening as described above consistent with the history of Clostridium difficile. 3. No other acute abnormalities. Electronically Signed   By: Gerome Sam III M.D   On: 05/06/2016 14:13        Discharge Exam: Filed Vitals:   05/09/16 1100 05/09/16 1600  BP: 110/64 104/58  Pulse: 81 79  Temp: 98.1 F (36.7 C) 98.4 F (36.9 C)  Resp: 17 21   Filed Vitals:   05/09/16 1028 05/09/16 1044 05/09/16 1100 05/09/16 1600  BP: 113/68 111/66 110/64 104/58  Pulse: 80 79 81 79  Temp:   98.1 F (36.7 C) 98.4 F (36.9 C)  TempSrc:      Resp: Height:   (1.727 m)    Weight:  98.2 kg (216 lb 7.9 oz)    SpO2:  99% 100% 100%    General: Pt is alert, awake, not in acute distress Cardiovascular: RRR, S1/S2 +, no rubs, no gallops Respiratory: CTA bilaterally, no wheezing, no rhonchi Abdominal: Soft, NT, ND, bowel sounds + Extremities: 1 + LE edema, no cyanosis   The results of significant diagnostics from this hospitalization (including imaging, microbiology, ancillary and laboratory) are listed below for reference.    Significant Diagnostic Studies: Ct Abdomen Pelvis Wo Contrast  05/06/2016  CLINICAL DATA:  Patient has had Celsius diff and diarrhea for 30 days. Increasing mid abdominal pain. EXAM: CT ABDOMEN AND PELVIS WITHOUT CONTRAST TECHNIQUE: Multidetector CT imaging of the abdomen and pelvis was performed following the standard protocol without IV contrast. COMPARISON:  None. FINDINGS: A dialysis catheter, entering via a left femoral approach, terminates in the right atrium. Cardiomegaly is identified. Small bilateral effusions are identified with underlying atelectasis. No other lung base abnormalities. Increased attenuation in the subcutaneous fat is consistent with volume overload. Ascites is seen in the abdomen. No free air. Evaluation of the colon is  limited due to lack of distention with contrast. However, given the history of Clostridium difficile, there does appear to be colonic wall thickening extending from the distal transverse colon through the descending colon. There may also be thickening of the colon wall in the ascending and proximal transverse colon. The stomach and small bowel are unremarkable. Cholelithiasis is seen in the gallbladder. The liver, spleen, adrenal glands, and pancreas are unremarkable. The kidneys are unremarkable. No suspicious adenopathy. The pelvis demonstrates a left femoral line. No suspicious masses or adenopathy. Free fluid identified. No acute bony abnormalities. IMPRESSION: 1. The study is limited due to lack of contrast in addition to volume overload resulting in increased attenuation in  the fat diffusely, pleural effusions, and ascites. 2. Evaluation of the colon is limited but I do suspect colonic wall thickening as described above consistent with the history of Clostridium difficile. 3. No other acute abnormalities. Electronically Signed   By: Gerome Sam III M.D   On: 05/06/2016 14:13     Microbiology: Recent Results (from the past 240 hour(s))  C difficile quick scan w PCR reflex     Status: Abnormal   Collection Time: 05/06/16 10:47 PM  Result Value Ref Range Status   C Diff antigen POSITIVE (A) NEGATIVE Final   C Diff toxin POSITIVE (A) NEGATIVE Final   C Diff interpretation Positive for toxigenic C. difficile  Final    Comment: CRITICAL RESULT CALLED TO, READ BACK BY AND VERIFIED WITH: KAMMIE MOORE,RN @0616  05/07/16 MKELLY   Gastrointestinal Panel by PCR , Stool     Status: None   Collection Time: 05/06/16 10:47 PM  Result Value Ref Range Status   Campylobacter species NOT DETECTED NOT DETECTED Final   Plesimonas shigelloides NOT DETECTED NOT DETECTED Final   Salmonella species NOT DETECTED NOT DETECTED Final   Yersinia enterocolitica NOT DETECTED NOT DETECTED Final   Vibrio species NOT  DETECTED NOT DETECTED Final   Vibrio cholerae NOT DETECTED NOT DETECTED Final   Enteroaggregative E coli (EAEC) NOT DETECTED NOT DETECTED Final   Enteropathogenic E coli (EPEC) NOT DETECTED NOT DETECTED Final   Enterotoxigenic E coli (ETEC) NOT DETECTED NOT DETECTED Final   Shiga like toxin producing E coli (STEC) NOT DETECTED NOT DETECTED Final   E. coli O157 NOT DETECTED NOT DETECTED Final   Shigella/Enteroinvasive E coli (EIEC) NOT DETECTED NOT DETECTED Final   Cryptosporidium NOT DETECTED NOT DETECTED Final   Cyclospora cayetanensis NOT DETECTED NOT DETECTED Final   Entamoeba histolytica NOT DETECTED NOT DETECTED Final   Giardia lamblia NOT DETECTED NOT DETECTED Final   Adenovirus F40/41 NOT DETECTED NOT DETECTED Final   Astrovirus NOT DETECTED NOT DETECTED Final   Norovirus GI/GII NOT DETECTED NOT DETECTED Final   Rotavirus A NOT DETECTED NOT DETECTED Final   Sapovirus (I, II, IV, and V) NOT DETECTED NOT DETECTED Final     Labs: Basic Metabolic Panel:  Recent Labs Lab 05/06/16 1240 05/06/16 2254 05/07/16 0528 05/08/16 0436 05/09/16 0724  NA 136 135 136 137 137  K 3.9 4.1 3.5 3.5 5.6*  CL 102 101 100* 103 102  CO2 25 22 25 25 26   GLUCOSE 104* 82 63* 76 104*  BUN 44* 46* 22* 28* 33*  CREATININE 8.08* 8.26* 5.05* 6.52* 7.34*  CALCIUM 8.7* 8.4* 8.4* 8.0* 8.2*  MG  --   --   --  1.9  --   PHOS 4.5 4.6  --   --  4.0   Liver Function Tests:  Recent Labs Lab 05/06/16 1240 05/06/16 2254 05/09/16 0724  AST 18  --   --   ALT 11*  --   --   ALKPHOS 180*  --   --   BILITOT 1.5*  --   --   PROT 7.3  --   --   ALBUMIN 2.4* 2.3* 2.2*    Recent Labs Lab 05/06/16 1240  LIPASE 37   No results for input(s): AMMONIA in the last 168 hours. CBC:  Recent Labs Lab 05/06/16 1240 05/06/16 2250 05/07/16 0528 05/08/16 0436 05/09/16 0343 05/09/16 0724  WBC 5.3 5.1 5.5 5.5 6.1 6.6  NEUTROABS 3.3  --   --   --   --   --  HGB 9.3* 9.5* 9.4* 9.2* 8.9* 9.4*  HCT 29.8*  30.1* 30.3* 29.3* 28.2* 30.9*  MCV 85.4 86.0 85.1 85.9 85.7 87.8  PLT 268 225 219 216 223 219   Cardiac Enzymes: No results for input(s): CKTOTAL, CKMB, CKMBINDEX, TROPONINI in the last 168 hours. BNP: Invalid input(s): POCBNP CBG:  Recent Labs Lab 05/08/16 0934 05/08/16 1211 05/08/16 1603 05/08/16 1933 05/09/16 1113  GLUCAP 92 100* 108* 131* 83    Time coordinating discharge:  Greater than 30 minutes  Signed:  TAT, DAVID, DO [facility] Pager: [phone] 05/09/2016, 6:44 PM

## 2016-05-10 LAB — GLUCOSE, CAPILLARY
GLUCOSE-CAPILLARY: 173 mg/dL — AB (ref 65–99)
Glucose-Capillary: 152 mg/dL — ABNORMAL HIGH (ref 65–99)
Glucose-Capillary: 137 mg/dL — ABNORMAL HIGH (ref 65–99)
Glucose-Capillary: 148 mg/dL — ABNORMAL HIGH (ref 65–99)

## 2016-05-10 NOTE — Care Management Important Message (Signed)
Important Message  Patient Details  Name: [facility] MRN: [id number] Date of Birth: 10-Mar-1951   Medicare Important Message Given:  Yes    Zareen Jamison, Annamarie Major, RN 05/10/2016, 1:06 PM

## 2016-05-10 NOTE — Progress Notes (Signed)
[facility] Progress Note   Subjective:  "I'm still having diarrhea but I feel a little better".   Objective Filed Vitals:   05/09/16 2001 05/09/16 2032 05/10/16 0643 05/10/16 0806  BP: 97/55  107/53 118/64  Pulse: 79 76 73 73  Temp: 98.4 F (36.9 C)  98.3 F (36.8 C) 97.8 F (36.6 C)  TempSrc: Oral  Tympanic Oral  Resp: Height:      Weight: 96 kg (211 lb 10.3 oz)     SpO2: 98% 98% 95% 97%    Physical Exam General: Well nourished, pleasant, NAD Heart: S1, S2, RRR Lungs: Bilateral breath sounds CTA A/P Abdomen: Active BS. Still slightly tender LQs. Umbilical hernia present.  Extremities: Mild ankle edema at present bilaterally.  Dialysis Access: LIJ TDC Drsg intact maturing LUA AVF + bruit  Dialysis: [facility]h 101kg 3/2.25 bath IJ cath Hep 3100 Mircera 150 mcg IV q 2 weeks (Last dose 05/02/16) Venofer 100 mg IV treatment X 10 (2 doses remaining)  Additional Objective Labs: Basic Metabolic Panel:  Recent Labs Lab 05/06/16 1240 05/06/16 2254 05/07/16 0528 05/08/16 0436 05/09/16 0724  NA 136 135 136 137 137  K 3.9 4.1 3.5 3.5 5.6*  CL 102 101 100* 103 102  CO2 GLUCOSE 104* 82 63* 76 104*  BUN 44* 46* 22* 28* 33*  CREATININE 8.08* 8.26* 5.05* 6.52* 7.34*  CALCIUM 8.7* 8.4* 8.4* 8.0* 8.2*  PHOS 4.5 4.6  --   --  4.0   Liver Function Tests:  Recent Labs Lab 05/06/16 1240 05/06/16 2254 05/09/16 0724  AST 18  --   --   ALT 11*  --   --   ALKPHOS 180*  --   --   BILITOT 1.5*  --   --   PROT 7.3  --   --   ALBUMIN 2.4* 2.3* 2.2*    Recent Labs Lab 05/06/16 1240  LIPASE 37   CBC:  Recent Labs Lab 05/06/16 1240 05/06/16 2250 05/07/16 0528 05/08/16 0436 05/09/16 0343 05/09/16 0724  WBC 5.3 5.1 5.5 5.5 6.1 6.6  NEUTROABS 3.3  --   --   --   --   --   HGB 9.3* 9.5* 9.4* 9.2* 8.9* 9.4*  HCT 29.8* 30.1* 30.3* 29.3* 28.2* 30.9*  MCV 85.4 86.0 85.1 85.9 85.7 87.8  PLT 268 225 219 216 223 219    Blood Culture    Component Value Date/Time   SDES URINE, CATHETERIZED 03/21/2016 0109   SPECREQUEST NONE 03/21/2016 0109   CULT MULTIPLE SPECIES PRESENT, SUGGEST RECOLLECTION* 03/21/2016 0109   REPTSTATUS 03/22/2016 FINAL 03/21/2016 0109    Cardiac Enzymes: No results for input(s): CKTOTAL, CKMB, CKMBINDEX, TROPONINI in the last 168 hours. CBG:  Recent Labs Lab 05/09/16 1113 05/09/16 1837 05/09/16 1959 05/10/16 0804 05/10/16 1201  GLUCAP 83 133* 165* 152* 137*   Iron Studies: No results for input(s): IRON, TIBC, TRANSFERRIN, FERRITIN in the last 72 hours. @ Studies/Results: No results found. Medications:   . atorvastatin  10 mg Oral Daily  . budesonide  0.5 mg Nebulization BID  . carvedilol  3.125 mg Oral BID WC  . famotidine  20 mg Oral Daily  . insulin aspart  0-5 Units Subcutaneous QHS  . insulin aspart  0-9 Units Subcutaneous TID WC  . insulin glargine  4 Units Subcutaneous QHS  . multivitamin  1 tablet Oral QHS  . saccharomyces boulardii  250 mg  Oral BID  . sodium chloride flush  3 mL Intravenous Q12H  . sodium chloride flush  3 mL Intravenous Q12H  . vancomycin  125 mg Oral Q6H     Assessment/Plan: 1. C Diff colitis: Per primary. C Diff Positive-on vanc per primary. Enteric isolation. Is improving. Will need to continue vanc for 2 weeks 2. Hematochrezia: Per primary-probably R/T C diff. Follow HGB. Hold heparin in HD tomorrow.  3. ESRD - New start at [facility].HD 05/09/16 on schedule. Next tx in-center if DC'd today.  4. Hypertension/volume - OP med list not available in [employer].SBP 100-120s in ED. Continue Coreg per primary. HD 05/09/16 pre wt 98.7 Net UF 570 Post wt 98.2 kgs. Now under OP EDW. Lower on DC.  5. Anemia - HGB 9.2 Last ESA dose 05/02/16. Has been being loaded with Fe. Tsat 15 04/11/16. Will continue Fe load unless patient has signs of sepsis.  6. Metabolic bone disease - No Binders/VRDA in [facility]. Check renal  profile with HD. Phos 4.7 Ca 9.2 C Ca 9.8 PTH 112 (04/11/16) 7. Nutrition - Albumin 2.3. Change to renal/Carb mod diet (K+ 3.5) renal vit/prostat. 8. DM: per primary   9. Chronic Systolic HF: Cont coreg, monitor volume. S/P AICD. Per primary 10. COPD: Per primary.   Disposition: Return to SNF upon DC. 2 weeks of vancomycin.     Rita H. Brown NP-C 05/08/2016, 10:28 AM  [facility] [phone]  Pt seen, examined and agree w A/P as above.  Vinson Moselle MD [employer] pager [phone]    cell [phone] 05/10/2016, 1:09 PM

## 2016-05-10 NOTE — Progress Notes (Signed)
PROGRESS NOTE                                                                                                                                                                                                             Patient Demographics:    Brandy Dudley, is a 65 y.o. female, DOB - 1951/06/04, ZOX:[id number]  Admit date - 05/06/2016   Admitting Physician Joseph Art, DO  Outpatient Primary MD for the patient is [name], MD  LOS - 4  Outpatient Specialists:  Chief Complaint  Patient presents with  . Diarrhea       Brief Narrative   65 year old female with ESRD on dialysis, hypertension, recent C. difficile who was discharged to skilled nursing facility to complete antibiotic course return to the hospital with watery diarrhea (after improvement for about a week). She missed last 2 dialysis sessions due to diarrhea. CT of the abdomen was consistent with recent infection and stool for C. difficile was again positive.  Subjective:   Patient reports having 1 loose bowel movement today. Diarrhea improving. Has mild abdominal discomfort.   Assessment  & Plan :   C. difficile colitis Developed colitis on 03/21/2016 after treated with clindamycin for breast abscess. Was treated for C. difficile recently but had recurrence. Now placed back on oral vancomycin for 2 weeks course. Diarrhea improved.  Hematochezia Small amount, likely due to C. difficile. Now improved. Coumadin has been discontinued (received 3 months of anticoagulation for DVT with right IJ catheter which has been removed). Monitor INR and hemoglobin as outpatient.  ESRD Received hemodialysis while in the hospital. Euvolemic. Continue scheduled outpatient dialysis.  COPD Stable. Continue home medications.  Type 2 diabetes mellitus A1c of 6.4. Home dose of Lantus reduced  History of DVT with a right IJ in 01/2016. Has completed approximately 3 months  of anticoagulation, right IJ has been removed. No plan to resume Coumadin.  CAD Continue statin.        Code Status : Full code  Family Communication  : None at bedside  Disposition Plan  : Return to skilled nursing facility    Consults  :  Nephrology    Lab Results  Component Value Date   PLT 219 05/09/2016    Antibiotics  : Oral vancomycin until 05/29/2016.  Anti-infectives    Start  Dose/Rate Route Frequency Ordered Stop   05/09/16 0000  vancomycin (VANCOCIN) 50 mg/mL oral solution     125 mg Oral Every 6 hours 05/09/16 1842     05/07/16 1300  vancomycin (VANCOCIN) 50 mg/mL oral solution 125 mg     125 mg Oral Every 6 hours 05/07/16 1154     05/06/16 1445  metroNIDAZOLE (FLAGYL) IVPB 500 mg  Status:  Discontinued     500 mg 100 mL/hr over 60 Minutes Intravenous  Once 05/06/16 1444 05/06/16 1504        Objective:   Filed Vitals:   05/09/16 2001 05/09/16 2032 05/10/16 0643 05/10/16 0806  BP: 97/55  107/53 118/64  Pulse: 79 76 73 73  Temp: 98.4 F (36.9 C)  98.3 F (36.8 C) 97.8 F (36.6 C)  TempSrc: Oral  Tympanic Oral  Resp: 18 16 16 18   Height:      Weight: 96 kg (211 lb 10.3 oz)     SpO2: 98% 98% 95% 97%    Wt Readings from Last 3 Encounters:  05/09/16 96 kg (211 lb 10.3 oz)  04/17/16 102.785 kg (226 lb 9.6 oz)  04/14/16 104.327 kg (230 lb)     Intake/Output Summary (Last 24 hours) at 05/10/16 1153 Last data filed at 05/10/16 0100  Gross per 24 hour  Intake    600 ml  Output      0 ml  Net    600 ml     Physical Exam  Gen: not in distress HEENT: no pallor, moist mucosa, supple neck Chest: clear b/l, no added sounds CVS: N S1&S2, no murmurs, rubs or gallop GI: soft, NT, ND, BS+ Musculoskeletal: warm, no edema CNS: Alert and oriented, nonfocal    Data Review:    CBC  Recent Labs Lab 05/06/16 1240 05/06/16 2250 05/07/16 0528 05/08/16 0436 05/09/16 0343 05/09/16 0724  WBC 5.3 5.1 5.5 5.5 6.1 6.6  HGB 9.3* 9.5* 9.4*  9.2* 8.9* 9.4*  HCT 29.8* 30.1* 30.3* 29.3* 28.2* 30.9*  PLT 268 225 219 216 223 219  MCV 85.4 86.0 85.1 85.9 85.7 87.8  MCH 26.6 27.1 26.4 27.0 27.1 26.7  MCHC 31.2 31.6 31.0 31.4 31.6 30.4  RDW 17.3* 17.5* 17.2* 17.3* 17.5* 17.6*  LYMPHSABS 1.0  --   --   --   --   --   MONOABS 0.9  --   --   --   --   --   EOSABS 0.1  --   --   --   --   --   BASOSABS 0.0  --   --   --   --   --     Chemistries   Recent Labs Lab 05/06/16 1240 05/06/16 2254 05/07/16 0528 05/08/16 0436 05/09/16 0724  NA 136 135 136 137 137  K 3.9 4.1 3.5 3.5 5.6*  CL 102 101 100* 103 102  CO2 25 22 25 25 26   GLUCOSE 104* 82 63* 76 104*  BUN 44* 46* 22* 28* 33*  CREATININE 8.08* 8.26* 5.05* 6.52* 7.34*  CALCIUM 8.7* 8.4* 8.4* 8.0* 8.2*  MG  --   --   --  1.9  --   AST 18  --   --   --   --   ALT 11*  --   --   --   --   ALKPHOS 180*  --   --   --   --   BILITOT 1.5*  --   --   --   --    ------------------------------------------------------------------------------------------------------------------  No results for input(s): CHOL, HDL, LDLCALC, TRIG, CHOLHDL, LDLDIRECT in the last 72 hours.  Lab Results  Component Value Date   HGBA1C 6.4 03/17/2016   ------------------------------------------------------------------------------------------------------------------ No results for input(s): TSH, T4TOTAL, T3FREE, THYROIDAB in the last 72 hours.  Invalid input(s): FREET3 ------------------------------------------------------------------------------------------------------------------ No results for input(s): VITAMINB12, FOLATE, FERRITIN, TIBC, IRON, RETICCTPCT in the last 72 hours.  Coagulation profile  Recent Labs Lab 05/06/16 1240 05/07/16 0528 05/08/16 0436  INR 2.66* 2.65* 2.62*    No results for input(s): DDIMER in the last 72 hours.  Cardiac Enzymes No results for input(s): CKMB, TROPONINI, MYOGLOBIN in the last 168 hours.  Invalid input(s):  CK ------------------------------------------------------------------------------------------------------------------    Component Value Date/Time   BNP 1489.8* 03/20/2016 1227    Inpatient Medications  Scheduled Meds: . atorvastatin  10 mg Oral Daily  . budesonide  0.5 mg Nebulization BID  . carvedilol  3.125 mg Oral BID WC  . famotidine  20 mg Oral Daily  . insulin aspart  0-5 Units Subcutaneous QHS  . insulin aspart  0-9 Units Subcutaneous TID WC  . insulin glargine  4 Units Subcutaneous QHS  . multivitamin  1 tablet Oral QHS  . saccharomyces boulardii  250 mg Oral BID  . sodium chloride flush  3 mL Intravenous Q12H  . sodium chloride flush  3 mL Intravenous Q12H  . vancomycin  125 mg Oral Q6H   Continuous Infusions:  PRN Meds:.sodium chloride, acetaminophen **OR** acetaminophen, albuterol, diphenhydrAMINE, HYDROcodone-acetaminophen, LORazepam, ondansetron **OR** ondansetron (ZOFRAN) IV, sodium chloride flush  Micro Results Recent Results (from the past 240 hour(s))  C difficile quick scan w PCR reflex     Status: Abnormal   Collection Time: 05/06/16 10:47 PM  Result Value Ref Range Status   C Diff antigen POSITIVE (A) NEGATIVE Final   C Diff toxin POSITIVE (A) NEGATIVE Final   C Diff interpretation Positive for toxigenic C. difficile  Final    Comment: CRITICAL RESULT CALLED TO, READ BACK BY AND VERIFIED WITH: KAMMIE MOORE,RN @0616  05/07/16 MKELLY   Gastrointestinal Panel by PCR , Stool     Status: None   Collection Time: 05/06/16 10:47 PM  Result Value Ref Range Status   Campylobacter species NOT DETECTED NOT DETECTED Final   Plesimonas shigelloides NOT DETECTED NOT DETECTED Final   Salmonella species NOT DETECTED NOT DETECTED Final   Yersinia enterocolitica NOT DETECTED NOT DETECTED Final   Vibrio species NOT DETECTED NOT DETECTED Final   Vibrio cholerae NOT DETECTED NOT DETECTED Final   Enteroaggregative E coli (EAEC) NOT DETECTED NOT DETECTED Final    Enteropathogenic E coli (EPEC) NOT DETECTED NOT DETECTED Final   Enterotoxigenic E coli (ETEC) NOT DETECTED NOT DETECTED Final   Shiga like toxin producing E coli (STEC) NOT DETECTED NOT DETECTED Final   E. coli O157 NOT DETECTED NOT DETECTED Final   Shigella/Enteroinvasive E coli (EIEC) NOT DETECTED NOT DETECTED Final   Cryptosporidium NOT DETECTED NOT DETECTED Final   Cyclospora cayetanensis NOT DETECTED NOT DETECTED Final   Entamoeba histolytica NOT DETECTED NOT DETECTED Final   Giardia lamblia NOT DETECTED NOT DETECTED Final   Adenovirus F40/41 NOT DETECTED NOT DETECTED Final   Astrovirus NOT DETECTED NOT DETECTED Final   Norovirus GI/GII NOT DETECTED NOT DETECTED Final   Rotavirus A NOT DETECTED NOT DETECTED Final   Sapovirus (I, II, IV, and V) NOT DETECTED NOT DETECTED Final    Radiology Reports Ct Abdomen Pelvis Wo Contrast  05/06/2016  CLINICAL DATA:  Patient has  had Celsius diff and diarrhea for 30 days. Increasing mid abdominal pain. EXAM: CT ABDOMEN AND PELVIS WITHOUT CONTRAST TECHNIQUE: Multidetector CT imaging of the abdomen and pelvis was performed following the standard protocol without IV contrast. COMPARISON:  None. FINDINGS: A dialysis catheter, entering via a left femoral approach, terminates in the right atrium. Cardiomegaly is identified. Small bilateral effusions are identified with underlying atelectasis. No other lung base abnormalities. Increased attenuation in the subcutaneous fat is consistent with volume overload. Ascites is seen in the abdomen. No free air. Evaluation of the colon is limited due to lack of distention with contrast. However, given the history of Clostridium difficile, there does appear to be colonic wall thickening extending from the distal transverse colon through the descending colon. There may also be thickening of the colon wall in the ascending and proximal transverse colon. The stomach and small bowel are unremarkable. Cholelithiasis is seen in the  gallbladder. The liver, spleen, adrenal glands, and pancreas are unremarkable. The kidneys are unremarkable. No suspicious adenopathy. The pelvis demonstrates a left femoral line. No suspicious masses or adenopathy. Free fluid identified. No acute bony abnormalities. IMPRESSION: 1. The study is limited due to lack of contrast in addition to volume overload resulting in increased attenuation in the fat diffusely, pleural effusions, and ascites. 2. Evaluation of the colon is limited but I do suspect colonic wall thickening as described above consistent with the history of Clostridium difficile. 3. No other acute abnormalities. Electronically Signed   By: Gerome Sam III M.D   On: 05/06/2016 14:13    Time Spent in minutes  25   Eddie North M.D on 05/10/2016 at 11:53 AM  Between 7am to 7pm - Pager - [phone]  After 7pm go to [email or web address] - password [facility]  [facility] -  Office  [phone]

## 2016-05-11 LAB — RENAL FUNCTION PANEL
Albumin: 2.2 g/dL — ABNORMAL LOW (ref 3.5–5.0)
Albumin: 2.2 g/dL — ABNORMAL LOW (ref 3.5–5.0)
Anion gap: 9 (ref 5–15)
Anion gap: 7 (ref 5–15)
BUN: 32 mg/dL — ABNORMAL HIGH (ref 6–20)
BUN: 14 mg/dL (ref 6–20)
CHLORIDE: 101 mmol/L (ref 101–111)
CO2: 27 mmol/L (ref 22–32)
CO2: 29 mmol/L (ref 22–32)
Calcium: 8.3 mg/dL — ABNORMAL LOW (ref 8.9–10.3)
Calcium: 8 mg/dL — ABNORMAL LOW (ref 8.9–10.3)
Chloride: 99 mmol/L — ABNORMAL LOW (ref 101–111)
Creatinine, Ser: 6.71 mg/dL — ABNORMAL HIGH (ref 0.44–1.00)
Creatinine, Ser: 3.91 mg/dL — ABNORMAL HIGH (ref 0.44–1.00)
GFR calc Af Amer: 13 mL/min — ABNORMAL LOW (ref >60)
GFR calc non Af Amer: 11 mL/min — ABNORMAL LOW (ref >60)
GFR, EST AFRICAN AMERICAN: 7 mL/min — AB (ref >60)
GFR, EST NON AFRICAN AMERICAN: 6 mL/min — AB (ref >60)
Glucose, Bld: 154 mg/dL — ABNORMAL HIGH (ref 65–99)
Glucose, Bld: 108 mg/dL — ABNORMAL HIGH (ref 65–99)
POTASSIUM: 4.2 mmol/L (ref 3.5–5.1)
Phosphorus: 3 mg/dL (ref 2.5–4.6)
Phosphorus: 1.7 mg/dL — ABNORMAL LOW (ref 2.5–4.6)
Potassium: 2.8 mmol/L — ABNORMAL LOW (ref 3.5–5.1)
Sodium: 137 mmol/L (ref 135–145)
Sodium: 135 mmol/L (ref 135–145)

## 2016-05-11 LAB — CBC
HCT: 30.7 % — ABNORMAL LOW (ref 36.0–46.0)
HCT: 30 % — ABNORMAL LOW (ref 36.0–46.0)
Hemoglobin: 9.4 g/dL — ABNORMAL LOW (ref 12.0–15.0)
Hemoglobin: 9.4 g/dL — ABNORMAL LOW (ref 12.0–15.0)
MCH: 26.7 pg (ref 26.0–34.0)
MCH: 26.7 pg (ref 26.0–34.0)
MCHC: 30.6 g/dL (ref 30.0–36.0)
MCHC: 31.3 g/dL (ref 30.0–36.0)
MCV: 87.2 fL (ref 78.0–100.0)
MCV: 85.2 fL (ref 78.0–100.0)
Platelets: 209 K/uL (ref 150–400)
Platelets: 198 10*3/uL (ref 150–400)
RBC: 3.52 MIL/uL — ABNORMAL LOW (ref 3.87–5.11)
RBC: 3.52 MIL/uL — ABNORMAL LOW (ref 3.87–5.11)
RDW: 17.7 % — ABNORMAL HIGH (ref 11.5–15.5)
RDW: 17.4 % — ABNORMAL HIGH (ref 11.5–15.5)
WBC: 10.7 K/uL — ABNORMAL HIGH (ref 4.0–10.5)
WBC: 11.9 K/uL — ABNORMAL HIGH (ref 4.0–10.5)

## 2016-05-11 LAB — GLUCOSE, CAPILLARY
GLUCOSE-CAPILLARY: 103 mg/dL — AB (ref 65–99)
GLUCOSE-CAPILLARY: 174 mg/dL — AB (ref 65–99)
GLUCOSE-CAPILLARY: 138 mg/dL — AB (ref 65–99)

## 2016-05-11 LAB — CBC AND DIFFERENTIAL: WBC: 11.9 10*3/mL

## 2016-05-11 MED ORDER — SODIUM CHLORIDE 0.9 % IV SOLN: 100.0000 mL | INTRAVENOUS | Status: DC | PRN

## 2016-05-11 MED ORDER — LIDOCAINE-PRILOCAINE 2.5-2.5 % EX CREA: 1.0000 "application " | TOPICAL_CREAM | CUTANEOUS | Status: DC | PRN

## 2016-05-11 MED ORDER — ALTEPLASE 2 MG IJ SOLR: 2.0000 mg | Freq: Once | INTRAMUSCULAR | Status: DC | PRN

## 2016-05-11 MED ORDER — HEPARIN SODIUM (PORCINE) 1000 UNIT/ML DIALYSIS: 3100.0000 [IU] | Freq: Once | INTRAMUSCULAR | Status: DC

## 2016-05-11 MED ORDER — LIDOCAINE HCL (PF) 1 % IJ SOLN: 5.0000 mL | INTRAMUSCULAR | Status: DC | PRN

## 2016-05-11 MED ORDER — HEPARIN SODIUM (PORCINE) 1000 UNIT/ML DIALYSIS: 1000.0000 [IU] | INTRAMUSCULAR | Status: DC | PRN

## 2016-05-11 MED ORDER — PENTAFLUOROPROP-TETRAFLUOROETH EX AERO: 1.0000 "application " | INHALATION_SPRAY | CUTANEOUS | Status: DC | PRN

## 2016-05-11 NOTE — Evaluation (Signed)
Physical Therapy Evaluation Patient Details Name: Brandy Dudley MRN: [id number] DOB: [birth year] Today's Date: 05/11/2016   History of Present Illness  Pt is a 65 y/o F with  C diff and second admission for this. SNF planned as she was recently admitted and back due to worsening of diarrhea. PMHx:  ICD, CHF, COPD, DVT, metabolic bone disease, DM, HTN, EF 35-40%  Clinical Impression  Pt is notably still having loose stools and will likely need to be kept in room initially for therapy although she is moving fairly well and if symptoms are resolved should be an efficient transition to home.  Follow acutely as needed.    Follow Up Recommendations SNF    Equipment Recommendations  None recommended by PT    Recommendations for Other Services Rehab consult     Precautions / Restrictions Precautions Precautions: Fall;ICD/Pacemaker Restrictions Weight Bearing Restrictions: No      Mobility  Bed Mobility Overal bed mobility: Needs Assistance Bed Mobility: Supine to Sit;Sit to Supine     Supine to sit: Min assist Sit to supine: Mod assist   General bed mobility comments: uses bed rail and elevated HOB to get up  Transfers Overall transfer level: Needs assistance Equipment used: Rolling walker (2 wheeled) Transfers: Sit to/from [employer] Sit to Stand: Min assist Stand pivot transfers: Min guard;Min assist          Ambulation/Gait Ambulation/Gait assistance: Min guard;Min assist Ambulation Distance (Feet): 40 Feet Assistive device: Rolling walker (2 wheeled);1 person hand held assist Gait Pattern/deviations: Step-through pattern;Shuffle;Wide base of support;Decreased stride length Gait velocity: reduced Gait velocity interpretation: Below normal speed for age/gender General Gait Details: pt is not attending to the walker and tends to let it get away from her unless PT directs her  Stairs            Wheelchair Mobility    Modified Rankin (Stroke  Patients Only)       Balance Overall balance assessment: Needs assistance Sitting-balance support: Feet supported Sitting balance-Leahy Scale: Fair   Postural control: Posterior lean Standing balance support: Bilateral upper extremity supported Standing balance-Leahy Scale: Poor Standing balance comment: fair with walker                             Pertinent Vitals/Pain Pain Assessment: No/denies pain    Home Living Family/patient expects to be discharged to:: Skilled nursing facility                 Additional Comments: From [name]    Prior Function Level of Independence: Needs assistance   Gait / Transfers Assistance Needed: SPC at home and RW more recently  ADL's / Homemaking Assistance Needed: was living alone and doing her own housework        Hand Dominance   Dominant Hand: Right    Extremity/Trunk Assessment   Upper Extremity Assessment: Overall WFL for tasks assessed           Lower Extremity Assessment: Generalized weakness      Cervical / Trunk Assessment: Normal  Communication   Communication: No difficulties  Cognition Arousal/Alertness: Awake/alert Behavior During Therapy: WFL for tasks assessed/performed Overall Cognitive Status: Within Functional Limits for tasks assessed                      General Comments General comments (skin integrity, edema, etc.): Pt is having multiple episodes of loose stool in small quantity while  PT in to see her.  Has plan to go to SNF after HD today, and will need to be seen in her room initially likely due to diarrhea    Exercises        Assessment/Plan    PT Assessment Patient needs continued PT services  PT Diagnosis Difficulty walking   PT Problem List Decreased strength;Decreased range of motion;Decreased activity tolerance;Decreased balance;Decreased mobility;Decreased coordination;Decreased knowledge of use of DME;Decreased safety awareness;Cardiopulmonary status  limiting activity;Obesity;Decreased skin integrity  PT Treatment Interventions DME instruction;Gait training;Functional mobility training;Therapeutic activities;Therapeutic exercise;Balance training;Neuromuscular re-education;Patient/family education   PT Goals (Current goals can be found in the Care Plan section) Acute Rehab PT Goals Patient Stated Goal: to be home safe PT Goal Formulation: With patient Time For Goal Achievement: 05/25/16 Potential to Achieve Goals: Good    Frequency Min 2X/week   Barriers to discharge Decreased caregiver support      Co-evaluation               End of Session   Activity Tolerance: Patient tolerated treatment well Patient left: in bed;with call [place]/phone within reach;with bed alarm set Nurse Communication: Mobility status         Time: 9509-3267 PT Time Calculation (min) (ACUTE ONLY): 38 min   Charges:   PT Evaluation $PT Eval Low Complexity: 1 Procedure PT Treatments $Gait Training: 8-22 mins $Therapeutic Activity: 23-37 mins   PT G Codes:        Ivar Drape 2016-06-08, 9:28 AM   Samul Dada, PT MS Acute Rehab Dept. Number: [facility] [id number] and [facility] [phone]

## 2016-05-11 NOTE — Progress Notes (Signed)
MD notified patient potassium 2.8

## 2016-05-11 NOTE — Clinical Social Work Note (Signed)
Patient seen by PT this morning and evaluation sent to facility and authorization process started by admissions director with [facility] P[facility]. CSW will f/u with admissions director on Friday regarding authorization.  Genelle Bal, MSW, LCSW Licensed Clinical Social Worker Clinical Social Work Department [employer] [phone]

## 2016-05-11 NOTE — Progress Notes (Signed)
PROGRESS NOTE                                                                                                                                                                                                             Patient Demographics:    Brandy Dudley, is a 65 y.o. female, DOB - Jul 18, 1951, SWF:[id number]  Admit date - 05/06/2016   Admitting Physician Joseph Art, DO  Outpatient Primary MD for the patient is [name], MD  LOS - 5  Outpatient Specialists:  Chief Complaint  Patient presents with  . Diarrhea       Brief Narrative   Please see d/c summary from 6/13 for details,  65 year old female with ESRD on dialysis, hypertension, recent C. difficile who was discharged to skilled nursing facility to complete antibiotic course return to the hospital with watery diarrhea (after improvement for about a week). She missed last 2 dialysis sessions due to diarrhea. CT of the abdomen was consistent with recent infection and stool for C. difficile was again positive.  Subjective:   No further diarrhea   Assessment  & Plan :   C. difficile colitis oral vancomycin for 2 weeks course. Diarrhea improved.  Hematochezia Small amount, likely due to C. difficile. Now improved. Coumadin has been discontinued (received 3 months of anticoagulation for DVT with right IJ catheter which has been removed). Monitor INR and hemoglobin as outpatient.  ESRD  HD today.  Continue scheduled outpatient dialysis.  COPD Stable. Continue home medications.  Type 2 diabetes mellitus A1c of 6.4. Home dose of Lantus reduced  History of DVT with a right IJ in 01/2016. Has completed approximately 3 months of anticoagulation, right IJ has been removed. No plan to resume Coumadin.          Code Status : Full code  Family Communication  : None at bedside  Disposition Plan  : Return to skilled nursing facility    Consults  :   Nephrology    Lab Results  Component Value Date   PLT 209 05/11/2016    Antibiotics  : Oral vancomycin until 05/29/2016.  Anti-infectives    Start     Dose/Rate Route Frequency Ordered Stop   05/09/16 0000  vancomycin (VANCOCIN) 50 mg/mL oral solution     125 mg Oral Every 6 hours 05/09/16 1842  05/07/16 1300  vancomycin (VANCOCIN) 50 mg/mL oral solution 125 mg     125 mg Oral Every 6 hours 05/07/16 1154     05/06/16 1445  metroNIDAZOLE (FLAGYL) IVPB 500 mg  Status:  Discontinued     500 mg 100 mL/hr over 60 Minutes Intravenous  Once 05/06/16 1444 05/06/16 1504        Objective:   Filed Vitals:   05/11/16 1110 05/11/16 1130 05/11/16 1200 05/11/16 1230  BP: 96/68 99/62 97/58  99/60  Pulse: 76 73 72 73  Temp:      TempSrc:      Resp: 21 20 21 21   Height:      Weight:      SpO2:        Wt Readings from Last 3 Encounters:  05/10/16 97 kg (213 lb 13.5 oz)  04/17/16 102.785 kg (226 lb 9.6 oz)  04/14/16 104.327 kg (230 lb)     Intake/Output Summary (Last 24 hours) at 05/11/16 1248 Last data filed at 05/11/16 0900  Gross per 24 hour  Intake    240 ml  Output      0 ml  Net    240 ml     Physical Exam  Gen: not in distress HEENT:  moist mucosa, Chest: clear b/l, CVS: N S1&S2,  GI: soft, NT, ND,  Musculoskeletal: warm, no edema     Data Review:    CBC  Recent Labs Lab 05/06/16 1240  05/07/16 0528 05/08/16 0436 05/09/16 0343 05/09/16 0724 05/11/16 1231  WBC 5.3  < > 5.5 5.5 6.1 6.6 10.7*  HGB 9.3*  < > 9.4* 9.2* 8.9* 9.4* 9.4*  HCT 29.8*  < > 30.3* 29.3* 28.2* 30.9* 30.7*  PLT 268  < > 219 216 223 219 209  MCV 85.4  < > 85.1 85.9 85.7 87.8 87.2  MCH 26.6  < > 26.4 27.0 27.1 26.7 26.7  MCHC 31.2  < > 31.0 31.4 31.6 30.4 30.6  RDW 17.3*  < > 17.2* 17.3* 17.5* 17.6* 17.7*  LYMPHSABS 1.0  --   --   --   --   --   --   MONOABS 0.9  --   --   --   --   --   --   EOSABS 0.1  --   --   --   --   --   --   BASOSABS 0.0  --   --   --   --   --   --     < > = values in this interval not displayed.  Chemistries   Recent Labs Lab 05/06/16 1240 05/06/16 2254 05/07/16 0528 05/08/16 0436 05/09/16 0724  NA 136 135 136 137 137  K 3.9 4.1 3.5 3.5 5.6*  CL 102 101 100* 103 102  CO2 25 22 25 25 26   GLUCOSE 104* 82 63* 76 104*  BUN 44* 46* 22* 28* 33*  CREATININE 8.08* 8.26* 5.05* 6.52* 7.34*  CALCIUM 8.7* 8.4* 8.4* 8.0* 8.2*  MG  --   --   --  1.9  --   AST 18  --   --   --   --   ALT 11*  --   --   --   --   ALKPHOS 180*  --   --   --   --   BILITOT 1.5*  --   --   --   --    ------------------------------------------------------------------------------------------------------------------ No results for  input(s): CHOL, HDL, LDLCALC, TRIG, CHOLHDL, LDLDIRECT in the last 72 hours.  Lab Results  Component Value Date   HGBA1C 6.4 03/17/2016   ------------------------------------------------------------------------------------------------------------------ No results for input(s): TSH, T4TOTAL, T3FREE, THYROIDAB in the last 72 hours.  Invalid input(s): FREET3 ------------------------------------------------------------------------------------------------------------------ No results for input(s): VITAMINB12, FOLATE, FERRITIN, TIBC, IRON, RETICCTPCT in the last 72 hours.  Coagulation profile  Recent Labs Lab 05/06/16 1240 05/07/16 0528 05/08/16 0436  INR 2.66* 2.65* 2.62*    No results for input(s): DDIMER in the last 72 hours.  Cardiac Enzymes No results for input(s): CKMB, TROPONINI, MYOGLOBIN in the last 168 hours.  Invalid input(s): CK ------------------------------------------------------------------------------------------------------------------    Component Value Date/Time   BNP 1489.8* 03/20/2016 1227    Inpatient Medications  Scheduled Meds: . atorvastatin  10 mg Oral Daily  . budesonide  0.5 mg Nebulization BID  . carvedilol  3.125 mg Oral BID WC  . famotidine  20 mg Oral Daily  . insulin aspart  0-5  Units Subcutaneous QHS  . insulin aspart  0-9 Units Subcutaneous TID WC  . insulin glargine  4 Units Subcutaneous QHS  . multivitamin  1 tablet Oral QHS  . saccharomyces boulardii  250 mg Oral BID  . sodium chloride flush  3 mL Intravenous Q12H  . sodium chloride flush  3 mL Intravenous Q12H  . vancomycin  125 mg Oral Q6H   Continuous Infusions:  PRN Meds:.sodium chloride, acetaminophen **OR** acetaminophen, albuterol, diphenhydrAMINE, HYDROcodone-acetaminophen, LORazepam, ondansetron **OR** ondansetron (ZOFRAN) IV, sodium chloride flush  Micro Results Recent Results (from the past 240 hour(s))  C difficile quick scan w PCR reflex     Status: Abnormal   Collection Time: 05/06/16 10:47 PM  Result Value Ref Range Status   C Diff antigen POSITIVE (A) NEGATIVE Final   C Diff toxin POSITIVE (A) NEGATIVE Final   C Diff interpretation Positive for toxigenic C. difficile  Final    Comment: CRITICAL RESULT CALLED TO, READ BACK BY AND VERIFIED WITH: KAMMIE MOORE,RN @0616  05/07/16 MKELLY   Gastrointestinal Panel by PCR , Stool     Status: None   Collection Time: 05/06/16 10:47 PM  Result Value Ref Range Status   Campylobacter species NOT DETECTED NOT DETECTED Final   Plesimonas shigelloides NOT DETECTED NOT DETECTED Final   Salmonella species NOT DETECTED NOT DETECTED Final   Yersinia enterocolitica NOT DETECTED NOT DETECTED Final   Vibrio species NOT DETECTED NOT DETECTED Final   Vibrio cholerae NOT DETECTED NOT DETECTED Final   Enteroaggregative E coli (EAEC) NOT DETECTED NOT DETECTED Final   Enteropathogenic E coli (EPEC) NOT DETECTED NOT DETECTED Final   Enterotoxigenic E coli (ETEC) NOT DETECTED NOT DETECTED Final   Shiga like toxin producing E coli (STEC) NOT DETECTED NOT DETECTED Final   E. coli O157 NOT DETECTED NOT DETECTED Final   Shigella/Enteroinvasive E coli (EIEC) NOT DETECTED NOT DETECTED Final   Cryptosporidium NOT DETECTED NOT DETECTED Final   Cyclospora cayetanensis NOT  DETECTED NOT DETECTED Final   Entamoeba histolytica NOT DETECTED NOT DETECTED Final   Giardia lamblia NOT DETECTED NOT DETECTED Final   Adenovirus F40/41 NOT DETECTED NOT DETECTED Final   Astrovirus NOT DETECTED NOT DETECTED Final   Norovirus GI/GII NOT DETECTED NOT DETECTED Final   Rotavirus A NOT DETECTED NOT DETECTED Final   Sapovirus (I, II, IV, and V) NOT DETECTED NOT DETECTED Final    Radiology Reports Ct Abdomen Pelvis Wo Contrast  05/06/2016  CLINICAL DATA:  Patient has had Celsius diff  and diarrhea for 30 days. Increasing mid abdominal pain. EXAM: CT ABDOMEN AND PELVIS WITHOUT CONTRAST TECHNIQUE: Multidetector CT imaging of the abdomen and pelvis was performed following the standard protocol without IV contrast. COMPARISON:  None. FINDINGS: A dialysis catheter, entering via a left femoral approach, terminates in the right atrium. Cardiomegaly is identified. Small bilateral effusions are identified with underlying atelectasis. No other lung base abnormalities. Increased attenuation in the subcutaneous fat is consistent with volume overload. Ascites is seen in the abdomen. No free air. Evaluation of the colon is limited due to lack of distention with contrast. However, given the history of Clostridium difficile, there does appear to be colonic wall thickening extending from the distal transverse colon through the descending colon. There may also be thickening of the colon wall in the ascending and proximal transverse colon. The stomach and small bowel are unremarkable. Cholelithiasis is seen in the gallbladder. The liver, spleen, adrenal glands, and pancreas are unremarkable. The kidneys are unremarkable. No suspicious adenopathy. The pelvis demonstrates a left femoral line. No suspicious masses or adenopathy. Free fluid identified. No acute bony abnormalities. IMPRESSION: 1. The study is limited due to lack of contrast in addition to volume overload resulting in increased attenuation in the fat  diffusely, pleural effusions, and ascites. 2. Evaluation of the colon is limited but I do suspect colonic wall thickening as described above consistent with the history of Clostridium difficile. 3. No other acute abnormalities. Electronically Signed   By: Gerome Sam III M.D   On: 05/06/2016 14:13    Time Spent in minutes  15   Eddie North M.D on 05/11/2016 at 12:48 PM  Between 7am to 7pm - Pager - [phone]  After 7pm go to [email or web address] - password [facility]  [facility] -  Office  [phone]

## 2016-05-11 NOTE — Progress Notes (Signed)
[facility] Progress Note   Subjective:  "I'm supposed to go home..." Says she feels better, working with PT attempting to get required information for insurance company prior to being DC'd to SNF.    Objective Filed Vitals:   05/10/16 2001 05/10/16 2133 05/11/16 0530 05/11/16 0816  BP:  113/83 109/60 111/63  Pulse:  76 73 78  Temp:  97.3 F (36.3 C) 97.6 F (36.4 C) 97.6 F (36.4 C)  TempSrc:  Oral Oral Oral  Resp:  16 16 16   Height:      Weight:  97 kg (213 lb 13.5 oz)    SpO2: 97% 96% 94% 98%   Physical Exam General: Pleasant, NAD Heart: S1, S2, RRR Lungs: Bilateral breath sounds CTA A/P Abdomen: Active BS. Nondistended, nontender today. Umbilical hernia present.  Extremities:Trace pre tib edema bilaterally  Dialysis Access: LIJ TDC Drsg intact maturing LUA AVF + bruit  Dialysis: [facility] 4h 101kg 3/2.25 bath IJ cath Hep 3100 Mircera 150 mcg IV q 2 weeks (Last dose 05/02/16) Venofer 100 mg IV treatment X 10 (1 doses remaining)   Additional Objective Labs: Basic Metabolic Panel:  Recent Labs Lab 05/06/16 1240 05/06/16 2254 05/07/16 0528 05/08/16 0436 05/09/16 0724  NA 136 135 136 137 137  K 3.9 4.1 3.5 3.5 5.6*  CL 102 101 100* 103 102  CO2 25 22 25 25 26   GLUCOSE 104* 82 63* 76 104*  BUN 44* 46* 22* 28* 33*  CREATININE 8.08* 8.26* 5.05* 6.52* 7.34*  CALCIUM 8.7* 8.4* 8.4* 8.0* 8.2*  PHOS 4.5 4.6  --   --  4.0   Liver Function Tests:  Recent Labs Lab 05/06/16 1240 05/06/16 2254 05/09/16 0724  AST 18  --   --   ALT 11*  --   --   ALKPHOS 180*  --   --   BILITOT 1.5*  --   --   PROT 7.3  --   --   ALBUMIN 2.4* 2.3* 2.2*    Recent Labs Lab 05/06/16 1240  LIPASE 37   CBC:  Recent Labs Lab 05/06/16 1240 05/06/16 2250 05/07/16 0528 05/08/16 0436 05/09/16 0343 05/09/16 0724  WBC 5.3 5.1 5.5 5.5 6.1 6.6  NEUTROABS 3.3  --   --   --   --   --   HGB 9.3* 9.5* 9.4* 9.2* 8.9* 9.4*  HCT 29.8* 30.1* 30.3* 29.3* 28.2*  30.9*  MCV 85.4 86.0 85.1 85.9 85.7 87.8  PLT 268 225 219 216 223 219   Blood Culture    Component Value Date/Time   SDES URINE, CATHETERIZED 03/21/2016 0109   SPECREQUEST NONE 03/21/2016 0109   CULT MULTIPLE SPECIES PRESENT, SUGGEST RECOLLECTION* 03/21/2016 0109   REPTSTATUS 03/22/2016 FINAL 03/21/2016 0109    Cardiac Enzymes: No results for input(s): CKTOTAL, CKMB, CKMBINDEX, TROPONINI in the last 168 hours. CBG:  Recent Labs Lab 05/10/16 0804 05/10/16 1201 05/10/16 1652 05/10/16 2142 05/11/16 0813  GLUCAP 152* 137* 173* 148* 103*   Iron Studies: No results for input(s): IRON, TIBC, TRANSFERRIN, FERRITIN in the last 72 hours. @lablastinr3 @ Studies/Results: No results found. Medications:   . atorvastatin  10 mg Oral Daily  . budesonide  0.5 mg Nebulization BID  . carvedilol  3.125 mg Oral BID WC  . famotidine  20 mg Oral Daily  . insulin aspart  0-5 Units Subcutaneous QHS  . insulin aspart  0-9 Units Subcutaneous TID WC  . insulin glargine  4 Units Subcutaneous QHS  . multivitamin  1 tablet Oral QHS  . saccharomyces boulardii  250 mg Oral BID  . sodium chloride flush  3 mL Intravenous Q12H  . sodium chloride flush  3 mL Intravenous Q12H  . vancomycin  125 mg Oral Q6H    Assessment/Plan: 1. C Diff colitis: Per primary. C Diff Positive-on vanc per primary. Enteric isolation. Is improving. Will need to continue vanc for 2 weeks. 2. Hematochrezia: Per primary-probably R/T C diff. Follow HGB. Hold heparin in HD tomorrow.  3. ESRD - New start at [facility].HD 05/09/16 on schedule. DC to SNF delayed. Will have HD here today. K+5.6 yesterday-recheck  4. Hypertension/volume - HD 05/09/16 pre wt 98.7 Net UF 570 Post wt 98.2 kgs. Now under OP EDW. Lower on DC. Last wt 97 kgs. 1.5-2 kg UFG 5. Anemia - HGB 9.4 Last ESA dose 05/02/16. Has been being loaded with Fe. Tsat 15 04/11/16. Will continue Fe load 6. Metabolic bone disease - No Binders/VRDA in  ecube. Check renal profile with HD. Phos 4.7 Ca 9.2 C Ca 9.8 PTH 112 (04/11/16) 7. Nutrition - Albumin 2.2. Change to renal/Carb mod diet (K+ 3.5) renal vit/prostat. 8. DM: per primary  9. Chronic Systolic HF: Cont coreg, monitor volume. S/P AICD. Per primary 10. COPD: Per primary.   Disposition: Return to SNF upon DC. 2 weeks of vancomycin.    Rita H. Brown NP-C 05/08/2016, 10:28 AM  [facility] [phone]    Pt seen, examined and agree w A/P as above.  Vinson Moselle MD [employer] pager [phone]    cell [phone] 05/11/2016, 12:58 PM

## 2016-05-12 LAB — RENAL FUNCTION PANEL
Albumin: 2.3 g/dL — ABNORMAL LOW (ref 3.5–5.0)
Anion gap: 7 (ref 5–15)
BUN: 20 mg/dL (ref 6–20)
CHLORIDE: 101 mmol/L (ref 101–111)
CO2: 27 mmol/L (ref 22–32)
CREATININE: 4.89 mg/dL — AB (ref 0.44–1.00)
Calcium: 8.3 mg/dL — ABNORMAL LOW (ref 8.9–10.3)
GFR calc Af Amer: 10 mL/min — ABNORMAL LOW (ref >60)
GFR, EST NON AFRICAN AMERICAN: 8 mL/min — AB (ref >60)
GLUCOSE: 109 mg/dL — AB (ref 65–99)
POTASSIUM: 3.6 mmol/L (ref 3.5–5.1)
Phosphorus: 2.6 mg/dL (ref 2.5–4.6)
Sodium: 135 mmol/L (ref 135–145)

## 2016-05-12 LAB — BASIC METABOLIC PANEL
BUN: 20 mg/dL (ref 4–21)
Creatinine: 4.9 mg/dL — AB (ref 0.5–1.1)
GLUCOSE: 109 mg/dL
Sodium: 135 mmol/L — AB (ref 137–147)

## 2016-05-12 LAB — GLUCOSE, CAPILLARY
GLUCOSE-CAPILLARY: 115 mg/dL — AB (ref 65–99)
Glucose-Capillary: 139 mg/dL — ABNORMAL HIGH (ref 65–99)

## 2016-05-12 NOTE — Progress Notes (Signed)
Patient seen and examined. No overnight issues. Discharge summary completed on 6/13. Vitals stable. Will be followed at [facility] and outpatient dialysis.

## 2016-05-12 NOTE — Progress Notes (Signed)
Brandy Dudley to be D/C'd Skilled nursing facility per MD order.  Discussed prescriptions and follow up appointments with the patient. Prescriptions given to patient, medication list explained in detail. Pt verbalized understanding.    Medication List    STOP taking these medications        warfarin 6 MG tablet  Commonly known as:  COUMADIN      TAKE these medications        acetaminophen 325 MG tablet  Commonly known as:  TYLENOL  Take 325 mg by mouth every 6 (six) hours as needed for mild pain.     PROAIR HFA 108 (90 Base) MCG/ACT inhaler  Generic drug:  albuterol  Inhale 2 puffs into the lungs 4 (four) times daily as needed for wheezing or shortness of breath.     albuterol (2.5 MG/3ML) 0.083% nebulizer solution  Commonly known as:  PROVENTIL  Take 3 mLs (2.5 mg total) by nebulization every 6 (six) hours as needed for wheezing or shortness of breath.     atorvastatin 10 MG tablet  Commonly known as:  LIPITOR  Take 10 mg by mouth at bedtime.     budesonide 0.5 MG/2ML nebulizer solution  Commonly known as:  PULMICORT  Take 2 mLs (0.5 mg total) by nebulization 2 (two) times daily.     carvedilol 3.125 MG tablet  Commonly known as:  COREG  Take 1 tablet (3.125 mg total) by mouth 2 (two) times daily with a meal.     CERTAVITE/ANTIOXIDANTS Tabs  Take 1 tablet by mouth at bedtime.     famotidine 20 MG tablet  Commonly known as:  PEPCID  Take 1 tablet (20 mg total) by mouth daily.     FIBER CHOICE PO  Take 2 tablets by mouth daily as needed (constipation).     hydrOXYzine 50 MG tablet  Commonly known as:  ATARAX/VISTARIL  Take 50 mg by mouth every 6 (six) hours as needed for itching.     insulin glargine 100 unit/mL Sopn  Commonly known as:  LANTUS  Inject 0.1 mLs (10 Units total) into the skin at bedtime.     insulin lispro 100 UNIT/ML injection  Commonly known as:  HUMALOG  Inject 5 Units into the skin 3 (three) times daily as needed for high blood sugar (CBG >180).      LORazepam 0.5 MG tablet  Commonly known as:  ATIVAN  Take 0.5 mg by mouth 2 (two) times daily as needed for anxiety.     OXYGEN  Inhale 2 L into the lungs at bedtime.     saccharomyces boulardii 250 MG capsule  Commonly known as:  FLORASTOR  Take 250 mg by mouth 2 (two) times daily. For recurrent C. Diff     vancomycin 50 mg/mL oral solution  Commonly known as:  VANCOCIN  Take 2.5 mLs (125 mg total) by mouth every 6 (six) hours. X 11 more days        Filed Vitals:   05/12/16 0452 05/12/16 0732  BP: 99/55 105/53  Pulse: 73 77  Temp: 98.6 F (37 C) 97.8 F (36.6 C)  Resp: 20 19    Skin clean, dry and intact without evidence of skin break down, no evidence of skin tears noted. IV catheter discontinued intact. Site without signs and symptoms of complications. Dressing and pressure applied. Pt denies pain at this time. No complaints noted.  An After Visit Summary was printed and given to the patient. Patient escorted via stretcher, and  D/C  via ambulance.  Janeann Forehand BSN, RN

## 2016-05-12 NOTE — Clinical Social Work Note (Signed)
Ms. Stofer discharging today back to [facility], transported by ambulance ([facility]). Call made to patient's sister, [name] [phone]) and informed her of today's discharge and transport. Discharge information transmitted to facility.  Genelle Bal, MSW, LCSW Licensed Clinical Social Worker Clinical Social Work Department [employer] [phone]

## 2016-05-12 NOTE — Care Management Note (Signed)
Case Management Note  Patient Details  Name: Brandy Dudley MRN: [id number] Date of Birth: [birth year]  Subjective/Objective:       CM following for progression and d/c planning.             Action/Plan: 05/12/2016 Pt to return to SNF. No HH or DME needs at this time. CSW working with pt to return to [facility].   Expected Discharge Date:    05/12/2016              Expected Discharge Plan:  Skilled Nursing Facility  In-House Referral:  Clinical Social Work  Discharge planning Services  NA  Post Acute Care Choice:  NA Choice offered to:  NA  DME Arranged:  N/A DME Agency:  NA  HH Arranged:  NA HH Agency:  NA  Status of Service:  Completed, signed off  Medicare Important Message Given:  Yes Date Medicare IM Given:    Medicare IM give by:    Date Additional Medicare IM Given:    Additional Medicare Important Message give by:     If discussed at Long Length of Stay Meetings, dates discussed:    Additional Comments:  Starlyn Skeans, RN 05/12/2016, 1:01 PM

## 2016-05-15 ENCOUNTER — Non-Acute Institutional Stay (SKILLED_NURSING_FACILITY): Payer: Medicare PPO | Admitting: Internal Medicine

## 2016-05-15 ENCOUNTER — Ambulatory Visit (HOSPITAL_COMMUNITY): Payer: Medicare PPO

## 2016-05-15 ENCOUNTER — Encounter: Payer: Self-pay | Admitting: Internal Medicine

## 2016-05-15 DIAGNOSIS — Z992 Dependence on renal dialysis: Secondary | ICD-10-CM

## 2016-05-15 DIAGNOSIS — J449 Chronic obstructive pulmonary disease, unspecified: Secondary | ICD-10-CM

## 2016-05-15 DIAGNOSIS — E1169 Type 2 diabetes mellitus with other specified complication: Secondary | ICD-10-CM

## 2016-05-15 DIAGNOSIS — A047 Enterocolitis due to Clostridium difficile: Secondary | ICD-10-CM | POA: Diagnosis not present

## 2016-05-15 DIAGNOSIS — K219 Gastro-esophageal reflux disease without esophagitis: Secondary | ICD-10-CM

## 2016-05-15 DIAGNOSIS — E785 Hyperlipidemia, unspecified: Secondary | ICD-10-CM | POA: Diagnosis not present

## 2016-05-15 DIAGNOSIS — D72829 Elevated white blood cell count, unspecified: Secondary | ICD-10-CM

## 2016-05-15 DIAGNOSIS — R1084 Generalized abdominal pain: Secondary | ICD-10-CM | POA: Diagnosis not present

## 2016-05-15 DIAGNOSIS — A0472 Enterocolitis due to Clostridium difficile, not specified as recurrent: Secondary | ICD-10-CM

## 2016-05-15 DIAGNOSIS — E119 Type 2 diabetes mellitus without complications: Secondary | ICD-10-CM | POA: Diagnosis not present

## 2016-05-15 DIAGNOSIS — I5022 Chronic systolic (congestive) heart failure: Secondary | ICD-10-CM | POA: Diagnosis not present

## 2016-05-15 DIAGNOSIS — E669 Obesity, unspecified: Secondary | ICD-10-CM

## 2016-05-15 DIAGNOSIS — D638 Anemia in other chronic diseases classified elsewhere: Secondary | ICD-10-CM

## 2016-05-15 DIAGNOSIS — R531 Weakness: Secondary | ICD-10-CM | POA: Diagnosis not present

## 2016-05-15 DIAGNOSIS — I10 Essential (primary) hypertension: Secondary | ICD-10-CM

## 2016-05-15 DIAGNOSIS — N186 End stage renal disease: Secondary | ICD-10-CM

## 2016-05-15 NOTE — Progress Notes (Signed)
LOCATION: [facility]  PCP: [name], MD   Code Status: Full Code  Goals of care: Advanced Directive information Advanced Directives 05/06/2016  Does patient have an advance directive? Yes  Type of Advance Directive Healthcare Power of Attorney  Does patient want to make changes to advanced directive? -  Copy of advanced directive(s) in chart? Yes       Extended Emergency Contact Information Primary Emergency Contact: Haith,[name] Address: [address]          [place], [place] [address] D[place] of [place] Home Phone: [phone] Relation: Sister Secondary Emergency Contact: Marcello Moores, [place] Home Phone: [phone] Relation: Sister   Allergies  Allergen Reactions  . Entresto [Sacubitril-Valsartan] Other (See Comments)    Not listed on [facility] 05/06/16 - unknown reaction  . Sulfa Antibiotics Itching    Chief Complaint  Patient presents with  . Readmit To SNF    Readmission     HPI:  Patient is a 65 y.o. female seen today for short term rehabilitation post hospital re-admission from 05/06/16-05/12/16 with c.diff colitis and hematochezia. She was started on po vancomycin. Her coumadin for right IJ DVT has been discontinued with completion of duration of treatment.   Review of Systems:  Constitutional: Negative for fever, chills, diaphoresis. feels weak and tired HENT: Negative for headache, congestion, nasal discharge, sore throat, difficulty swallowing.   Eyes: Negative for blurred vision, double vision and discharge.  Respiratory: Negative for wheezing. Positive for dry cough and orthopnea Cardiovascular: Negative for chest pain, palpitations, leg swelling.  Gastrointestinal: Negative for heartburn, nausea, vomiting. Positive for abdominal pain. Last bowel movement was this morning. Mentions that her stool has been semi-formed at present. Genitourinary: Negative for dysuria and flank pain. Minimal  urine. Musculoskeletal: Negative for back pain, fall in the facility.  Skin: Negative for itching, rash.  Neurological: Negative for dizziness. Psychiatric/Behavioral: Negative for depression   Past Medical History  Diagnosis Date  . Coronary artery disease   . CHF (congestive heart failure) (HCC)     systolic  . COPD (chronic obstructive pulmonary disease) (HCC)   . Hypertension   . Renal disorder   . ESRD (end stage renal disease) (HCC)     tues/thurs/sat dialysis  . DVT (deep venous thrombosis) (HCC)     on coumadin  . C. difficile colitis   . Diabetes mellitus (HCC)   . Obstructive sleep apnea   . Anemia    Past Surgical History  Procedure Laterality Date  . Insertion of dialysis catheter Left 04/03/2016    Procedure: INSERTION OF DIALYSIS CATHETER;  Surgeon: Larina Earthly, MD;  Location: [facility];  Service: Vascular;  Laterality: Left;  . Av fistula placement Left 04/03/2016    Procedure: ARTERIOVENOUS (AV) FISTULA CREATION;  Surgeon: Larina Earthly, MD;  Location: [facility] OR;  Service: Vascular;  Laterality: Left;   Social History:   reports that she quit smoking about 10 years ago. Her smoking use included Cigarettes. She has never used smokeless tobacco. She reports that she does not drink alcohol or use illicit drugs.  No family history on file.  Medications:   Medication List       This list is accurate as of: 05/15/16 10:39 AM.  Always use your most recent med list.               acetaminophen 325 MG tablet  Commonly known as:  TYLENOL  Take 325 mg by mouth every 6 (six) hours as needed for mild pain.     PROAIR HFA 108 (90 Base) MCG/ACT inhaler  Generic drug:  albuterol  Inhale 2 puffs into the lungs 4 (four) times daily as needed for wheezing or shortness of breath.     albuterol (2.5 MG/3ML) 0.083% nebulizer solution  Commonly known as:  PROVENTIL  Take 3 mLs (2.5 mg total) by nebulization every 6 (six) hours as needed for wheezing or shortness of breath.      atorvastatin 10 MG tablet  Commonly known as:  LIPITOR  Take 10 mg by mouth at bedtime.     budesonide 0.5 MG/2ML nebulizer solution  Commonly known as:  PULMICORT  Take 2 mLs (0.5 mg total) by nebulization 2 (two) times daily.     carvedilol 3.125 MG tablet  Commonly known as:  COREG  Take 3.125 mg by mouth 2 (two) times daily with a meal.     CERTAVITE/ANTIOXIDANTS Tabs  Take 1 tablet by mouth at bedtime.     famotidine 20 MG tablet  Commonly known as:  PEPCID  Take 20 mg by mouth daily.     FIBER CHOICE PO  Take 2 tablets by mouth daily as needed (constipation).     hydrOXYzine 50 MG tablet  Commonly known as:  ATARAX/VISTARIL  Take 50 mg by mouth every 6 (six) hours as needed for itching.     insulin glargine 100 unit/mL Sopn  Commonly known as:  LANTUS  Inject 0.1 mLs (10 Units total) into the skin at bedtime.     insulin lispro 100 UNIT/ML injection  Commonly known as:  HUMALOG  Inject 5 Units into the skin 3 (three) times daily as needed for high blood sugar (CBG >180).     LORazepam 0.5 MG tablet  Commonly known as:  ATIVAN  Take 0.5 mg by mouth 2 (two) times daily as needed for anxiety.     OXYGEN  Inhale 2 L into the lungs at bedtime.     saccharomyces boulardii 250 MG capsule  Commonly known as:  FLORASTOR  Take 250 mg by mouth 2 (two) times daily. For recurrent C. Diff     vancomycin 50 mg/mL oral solution  Commonly known as:  VANCOCIN  Take 2.5 mLs (125 mg total) by mouth every 6 (six) hours. X 11 more days        Immunizations: Immunization History  Administered Date(s) Administered  . PPD Test 03/01/2016, 04/10/2016     Physical Exam: Filed Vitals:   05/15/16 1025  BP: 144/78  Pulse: 72  Temp: 98.5 F (36.9 C)  TempSrc: Oral  Resp: 18  Height: 5\' 8"  (1.727 m)  Weight: 217 lb (98.431 kg)  SpO2: 96%   Body mass index is 33 kg/(m^2).  General- elderly female, obese, in no acute distress Head- normocephalic, atraumatic Nose-  no  maxillary or frontal sinus tenderness, no nasal discharge Throat- moist mucus membrane, upper dentures, poor lower dentition Eyes- PERRLA, EOMI, no pallor, no icterus, no discharge, normal conjunctiva, normal sclera Neck- no cervical lymphadenopathy Cardiovascular- normal s1,s2, no murmur, trace leg edema Respiratory- bilateral poor air entry, no wheeze, no rhonchi, no crackles, no use of accessory muscles Abdomen- bowel sounds present, soft, non tender, distended, has umbilical hernia Musculoskeletal- able to move all 4 extremities, generalized weakness Neurological- alert and oriented to person, place and time Skin- warm and dry, AV fistula to LUE, dialysis catheter to left groin area  Psychiatry- normal mood and affect     Labs reviewed: Basic Metabolic Panel:  Recent Labs  28/41/32 0410  04/03/16 1423  05/08/16 0436  05/11/16 1231 05/11/16 1643 05/12/16 05/12/16 0805  NA 137  < >  --   < > 137  < > 137 135 135* 135  K 3.5  < >  --   < > 3.5  < > 4.2 2.8*  --  3.6  CL 102  < >  --   < > 103  < > 101 99*  --  101  CO2 27  < >  --   < > 25  < > 27 29  --  27  GLUCOSE 238*  < >  --   < > 76  < > 154* 108*  --  109*  BUN 26*  < >  --   < > 28*  < > 32* CREATININE 2.66*  < >  --   < > 6.52*  < > 6.71* 3.91* 4.9* 4.89*  CALCIUM 8.0*  < >  --   < > 8.0*  < > 8.3* 8.0*  --  8.3*  MG 2.2  --  2.1  --  1.9  --   --   --   --   --   PHOS 2.7  < >  --   < >  --   < > 3.0 1.7*  --  2.6  < > = values in this interval not displayed. Liver Function Tests:  Recent Labs  03/04/16  03/21/16 0807  04/17/16 05/06/16 1240  05/11/16 1231 05/11/16 1643 05/12/16 0805  AST 43*  < > 22  --  12* 18  --   --   --   --   ALT 15  < > 11*  --  6* 11*  --   --   --   --   ALKPHOS 207*  < > 110  --  164* 180*  --   --   --   --   BILITOT 2.3*  --  1.5*  --   --  1.5*  --   --   --   --   PROT 6.9  --  5.7*  --   --  7.3  --   --   --   --   ALBUMIN 2.9*  --  2.1*  < >  --  2.4*  < > 2.2*  2.2* 2.3*  < > = values in this interval not displayed.  Recent Labs  03/04/16 05/06/16 1240  LIPASE 44 37   No results for input(s): AMMONIA in the last 8760 hours. CBC:  Recent Labs  03/14/16 1457  03/20/16 1227  05/06/16 1240  05/09/16 0724 05/11/16 05/11/16 1231 05/11/16 1643  WBC 13.0*  < > 7.5  < > 5.3  < > 6.6 11.9 10.7* 11.9*  NEUTROABS 11.0*  --  5.7  --  3.3  --   --   --   --   --   HGB 9.3*  < > 9.0*  < > 9.3*  < > 9.4*  --  9.4* 9.4*  HCT 30.2*  < > 28.4*  < > 29.8*  < > 30.9*  --  30.7* 30.0*  MCV 89.3  --  85.5  < > 85.4  < > 87.8  --  87.2 85.2  PLT 136*  < > 165  < > 268  < >  219  --  209 198  < > = values in this interval not displayed. Cardiac Enzymes: No results for input(s): CKTOTAL, CKMB, CKMBINDEX, TROPONINI in the last 8760 hours. BNP: Invalid input(s): POCBNP CBG:  Recent Labs  05/11/16 2021 05/12/16 0732 05/12/16 1217  GLUCAP 138* 115* 139*    Radiological Exams: Ct Abdomen Pelvis Wo Contrast  05/06/2016  CLINICAL DATA:  Patient has had Celsius diff and diarrhea for 30 days. Increasing mid abdominal pain. EXAM: CT ABDOMEN AND PELVIS WITHOUT CONTRAST TECHNIQUE: Multidetector CT imaging of the abdomen and pelvis was performed following the standard protocol without IV contrast. COMPARISON:  None. FINDINGS: A dialysis catheter, entering via a left femoral approach, terminates in the right atrium. Cardiomegaly is identified. Small bilateral effusions are identified with underlying atelectasis. No other lung base abnormalities. Increased attenuation in the subcutaneous fat is consistent with volume overload. Ascites is seen in the abdomen. No free air. Evaluation of the colon is limited due to lack of distention with contrast. However, given the history of Clostridium difficile, there does appear to be colonic wall thickening extending from the distal transverse colon through the descending colon. There may also be thickening of the colon wall in the  ascending and proximal transverse colon. The stomach and small bowel are unremarkable. Cholelithiasis is seen in the gallbladder. The liver, spleen, adrenal glands, and pancreas are unremarkable. The kidneys are unremarkable. No suspicious adenopathy. The pelvis demonstrates a left femoral line. No suspicious masses or adenopathy. Free fluid identified. No acute bony abnormalities. IMPRESSION: 1. The study is limited due to lack of contrast in addition to volume overload resulting in increased attenuation in the fat diffusely, pleural effusions, and ascites. 2. Evaluation of the colon is limited but I do suspect colonic wall thickening as described above consistent with the history of Clostridium difficile. 3. No other acute abnormalities. Electronically Signed   By: Gerome Sam III M.D   On: 05/06/2016 14:13    Assessment/Plan  Generalized weakness From deconditioning. Will have her work with physical therapy and occupational therapy team to help with gait training and muscle strengthening exercises.fall precautions. Skin care. Encourage to be out of bed.   C.diff colitis Continue and complete course of po vancomycin 125 mg qid for total of 2 weeks until 05/23/16. Maintain hydration and continue probiotic. Continue contact precautions  Abdominal pain Has ascites noted on CT abdomen. Mildly distended abdomen. Remains afebrile. She is on dialysis and has it tomorrow. Monitor clinically and if has worsening symptom, might need diagnostic and therapeutic paracentesis.  gerd Stable, continue famotidine 20 mg daily  ESRD Continue HD 3 days a week and to have her on renal diet  CHF Monitor fluid intake and clinical symptom. Continue coreg 3.125 mg bid. Check daily weight for now. Continue o2 at bedtime  HLD Continue atorvastatin  COPD Breathing is stable. Continue breathing treatment  DM Lab Results  Component Value Date   HGBA1C 6.4 03/17/2016   Monitor cbg. Continue lantus 10 u daily  and SSI humalog  Anemia of chronic disease Monitor cbc  Leukocytosis Afebrile, monitor clinically, continue and complete her antibiotic course  Hypertension Stable. Monitor BP daily for a week. continue coreg 3.125 mg twice daily    Goals of care: short term rehabilitation   Labs/tests ordered: cbc, cmp 05/16/16  Family/ staff Communication: reviewed care plan with patient and nursing supervisor    [name], MD Internal Medicine [facility] [address]  [place],  [address] Cell Phone (Monday-Friday 8 am - 5 pm): [phone] On Call: [phone] and follow prompts after 5 pm and on weekends Office Phone: [phone] Office Fax: [phone]

## 2016-05-16 ENCOUNTER — Encounter: Payer: Medicare PPO | Admitting: Vascular Surgery

## 2016-05-16 LAB — BASIC METABOLIC PANEL
BUN: 37 mg/dL — AB (ref 4–21)
Creatinine: 5.9 mg/dL — AB (ref 0.5–1.1)
GLUCOSE: 125 mg/dL
Potassium: 3.9 mmol/L (ref 3.4–5.3)
SODIUM: 141 mmol/L (ref 137–147)

## 2016-05-16 LAB — CBC AND DIFFERENTIAL
HEMATOCRIT: 31 % — AB (ref 36–46)
Hemoglobin: 9.9 g/dL — AB (ref 12.0–16.0)
Platelets: 219 10*3/uL (ref 150–399)
WBC: 11 10*3/mL

## 2016-05-16 LAB — HEPATIC FUNCTION PANEL
ALT: 12 U/L (ref 7–35)
AST: 18 U/L (ref 13–35)
Alkaline Phosphatase: 196 U/L — AB (ref 25–125)
BILIRUBIN, TOTAL: 1.2 mg/dL

## 2016-05-17 ENCOUNTER — Encounter: Payer: Self-pay | Admitting: Vascular Surgery

## 2016-05-23 ENCOUNTER — Encounter: Payer: Medicare PPO | Admitting: Vascular Surgery

## 2016-05-23 ENCOUNTER — Inpatient Hospital Stay (HOSPITAL_COMMUNITY): Admit: 2016-05-23 | Payer: Medicare PPO

## 2016-05-23 LAB — CBC AND DIFFERENTIAL
HEMATOCRIT: 30 % — AB (ref 36–46)
Hemoglobin: 9.6 g/dL — AB (ref 12.0–16.0)
PLATELETS: 229 10*3/uL (ref 150–399)
WBC: 8.1 10*3/mL

## 2016-05-23 LAB — HEPATIC FUNCTION PANEL
ALK PHOS: 222 U/L — AB (ref 25–125)
ALT: 13 U/L (ref 7–35)
AST: 25 U/L (ref 13–35)
BILIRUBIN, TOTAL: 1.2 mg/dL

## 2016-05-23 LAB — BASIC METABOLIC PANEL
BUN: 36 mg/dL — AB (ref 4–21)
CREATININE: 4.6 mg/dL — AB (ref 0.5–1.1)
Glucose: 122 mg/dL
Potassium: 4.3 mmol/L (ref 3.4–5.3)
Sodium: 143 mmol/L (ref 137–147)

## 2016-05-31 ENCOUNTER — Encounter: Payer: Self-pay | Admitting: Internal Medicine

## 2016-05-31 ENCOUNTER — Non-Acute Institutional Stay (SKILLED_NURSING_FACILITY): Payer: Medicare PPO | Admitting: Internal Medicine

## 2016-05-31 DIAGNOSIS — R195 Other fecal abnormalities: Secondary | ICD-10-CM

## 2016-05-31 DIAGNOSIS — R1084 Generalized abdominal pain: Secondary | ICD-10-CM | POA: Diagnosis not present

## 2016-05-31 DIAGNOSIS — K429 Umbilical hernia without obstruction or gangrene: Secondary | ICD-10-CM | POA: Diagnosis not present

## 2016-05-31 NOTE — Progress Notes (Signed)
[facility]  PCP: [name], MD   Code Status: Full Code  Goals of care: Advanced Directive information Advanced Directives 05/06/2016  Does patient have an advance directive? Yes  Type of Advance Directive Healthcare Power of Attorney  Does patient want to make changes to advanced directive? -  Copy of advanced directive(s) in chart? Yes       Extended Emergency Contact Information Primary Emergency Contact: Haith,[name] Address: [address]          [place], [place] [address] [place] of [place] Home Phone: [phone] Relation: Sister Secondary Emergency Contact: Marcello Moores, [place] Home Phone: [phone] Relation: Sister   Allergies  Allergen Reactions  . Entresto [Sacubitril-Valsartan] Other (See Comments)    Not listed on [facility] 05/06/16 - unknown reaction  . Sulfa Antibiotics Itching    Chief Complaint  Patient presents with  . Acute Visit    Worsening abdominal pain     HPI:  Patient is a 65 y.o. female seen today for  Acute visit. She has started having loose stool again and complaints of abdominal pain around her umbilicus with cough and straining. She is here for short term rehabilitation post hospital re-admission from 05/06/16-05/12/16 with c.diff colitis and hematochezia. She has history of umbilical hernia.   Review of Systems:  Constitutional: Negative for fever, chills. feels weak and tired HENT: Negative for headache, congestion. Eyes: Negative for blurred vision, double vision and discharge.  Respiratory: Negative for wheezing. Positive for dry cough and orthopnea Cardiovascular: Negative for chest pain, palpitations, leg swelling.  Gastrointestinal: Negative for heartburn, nausea, vomiting. Positive for abdominal pain. Last bowel movement was this morning with loose stool Genitourinary: Negative for dysuria and flank pain. Minimal urine. Musculoskeletal: Negative for back pain, fall  in the facility.  Skin: Negative for itching, rash.  Neurological: Negative for dizziness. Psychiatric/Behavioral: Negative for depression   Past Medical History  Diagnosis Date  . Coronary artery disease   . CHF (congestive heart failure) (HCC)     systolic  . COPD (chronic obstructive pulmonary disease) (HCC)   . Hypertension   . Renal disorder   . ESRD (end stage renal disease) (HCC)     tues/thurs/sat dialysis  . DVT (deep venous thrombosis) (HCC)     on coumadin  . C. difficile colitis   . Diabetes mellitus (HCC)   . Obstructive sleep apnea   . Anemia    Past Surgical History  Procedure Laterality Date  . Insertion of dialysis catheter Left 04/03/2016    Procedure: INSERTION OF DIALYSIS CATHETER;  Surgeon: Larina Earthly, MD;  Location: [facility] OR;  Service: Vascular;  Laterality: Left;  . Av fistula placement Left 04/03/2016    Procedure: ARTERIOVENOUS (AV) FISTULA CREATION;  Surgeon: Larina Earthly, MD;  Location: [facility] OR;  Service: Vascular;  Laterality: Left;   Social History:   reports that she quit smoking about 10 years ago. Her smoking use included Cigarettes. She has never used smokeless tobacco. She reports that she does not drink alcohol or use illicit drugs.  No family history on file.  Medications:   Medication List       This list is accurate as of: 05/31/16  1:48 PM.  Always use your most recent med list.               acetaminophen 325 MG tablet  Commonly known as:  TYLENOL  Take 325 mg by mouth every 6 (six) hours as needed for mild pain.     PROAIR HFA 108 (90 Base) MCG/ACT inhaler  Generic drug:  albuterol  Inhale 2 puffs into the lungs 4 (four) times daily as needed for wheezing or shortness of breath.     albuterol (2.5 MG/3ML) 0.083% nebulizer solution  Commonly known as:  PROVENTIL  Take 3 mLs (2.5 mg total) by nebulization every 6 (six) hours as needed for wheezing or shortness of breath.     atorvastatin 10 MG tablet  Commonly known as:  LIPITOR   Take 10 mg by mouth at bedtime.     budesonide 0.5 MG/2ML nebulizer solution  Commonly known as:  PULMICORT  Take 2 mLs (0.5 mg total) by nebulization 2 (two) times daily.     carvedilol 3.125 MG tablet  Commonly known as:  COREG  Take 3.125 mg by mouth 2 (two) times daily with a meal. Reported on 05/31/2016     CERTAVITE/ANTIOXIDANTS Tabs  Take 1 tablet by mouth at bedtime.     famotidine 20 MG tablet  Commonly known as:  PEPCID  Take 20 mg by mouth daily.     FIBER CHOICE PO  Take 2 tablets by mouth daily as needed (constipation).     hydrOXYzine 50 MG tablet  Commonly known as:  ATARAX/VISTARIL  Take 50 mg by mouth every 6 (six) hours as needed for itching.     insulin glargine 100 unit/mL Sopn  Commonly known as:  LANTUS  Inject 0.1 mLs (10 Units total) into the skin at bedtime.     insulin lispro 100 UNIT/ML injection  Commonly known as:  HUMALOG  Inject 5 Units into the skin 3 (three) times daily as needed for high blood sugar (CBG >180).     LORazepam 0.5 MG tablet  Commonly known as:  ATIVAN  Take 0.5 mg by mouth 2 (two) times daily as needed for anxiety.     OXYGEN  Inhale 2 L into the lungs at bedtime.     saccharomyces boulardii 250 MG capsule  Commonly known as:  FLORASTOR  Take 250 mg by mouth 2 (two) times daily. For recurrent C. Diff        Immunizations: Immunization History  Administered Date(s) Administered  . PPD Test 03/01/2016, 04/10/2016     Physical Exam: Filed Vitals:   05/31/16 1340  BP: 110/74  Pulse: 75  Temp: 98.7 F (37.1 C)  TempSrc: Oral  Resp: 22  Height: 5\' 8"  (1.727 m)  Weight: 217 lb (98.431 kg)  SpO2: 95%   Body mass index is 33 kg/(m^2).  General- elderly female, obese, in no acute distress Head- normocephalic, atraumatic Throat- moist mucus membrane Eyes- PERRLA, EOMI, no pallor, no icterus Cardiovascular- normal s1,s2, no murmur, trace leg edema Respiratory- bilateral poor air entry, no wheeze, no rhonchi,  no crackles, no use of accessory muscles Abdomen- bowel sounds present, soft and distended, tenderness around her umbilical area with protrusion of hernia with coughing that is reducible, tenderness to epigastric area Musculoskeletal- able to move all 4 extremities, generalized weakness Neurological- alert and oriented to person, place and time Skin- warm and dry Psychiatry- normal mood and affect     Labs reviewed: Basic Metabolic Panel:  Recent Labs  16/10/96 0410  04/03/16 1423  05/08/16 0436  05/11/16 1231 05/11/16 1643  05/12/16 0805 05/16/16 05/23/16  NA 137  < >  --   < > 137  < > 137  135  < > 135 141 143  K 3.5  < >  --   < > 3.5  < > 4.2 2.8*  --  3.6 3.9 4.3  CL 102  < >  --   < > 103  < > 101 99*  --  101  --   --   CO2 27  < >  --   < > 25  < > 27 29  --  27  --   --   GLUCOSE 238*  < >  --   < > 76  < > 154* 108*  --  109*  --   --   BUN 26*  < >  --   < > 28*  < > 32* 14  < > 20 37* 36*  CREATININE 2.66*  < >  --   < > 6.52*  < > 6.71* 3.91*  < > 4.89* 5.9* 4.6*  CALCIUM 8.0*  < >  --   < > 8.0*  < > 8.3* 8.0*  --  8.3*  --   --   MG 2.2  --  2.1  --  1.9  --   --   --   --   --   --   --   PHOS 2.7  < >  --   < >  --   < > 3.0 1.7*  --  2.6  --   --   < > = values in this interval not displayed. Liver Function Tests:  Recent Labs  03/04/16  03/21/16 0807  05/06/16 1240  05/11/16 1231 05/11/16 1643 05/12/16 0805 05/16/16 05/23/16  AST 43*  < > 22  < > 18  --   --   --   --  18 25  ALT 15  < > 11*  < > 11*  --   --   --   --  12 13  ALKPHOS 207*  < > 110  < > 180*  --   --   --   --  196* 222*  BILITOT 2.3*  --  1.5*  --  1.5*  --   --   --   --   --   --   PROT 6.9  --  5.7*  --  7.3  --   --   --   --   --   --   ALBUMIN 2.9*  --  2.1*  < > 2.4*  < > 2.2* 2.2* 2.3*  --   --   < > = values in this interval not displayed.  Recent Labs  03/04/16 05/06/16 1240  LIPASE 44 37   No results for input(s): AMMONIA in the last 8760 hours. CBC:  Recent  Labs  03/14/16 1457  03/20/16 1227  05/06/16 1240  05/09/16 0724  05/11/16 1231 05/11/16 1643 05/16/16 05/23/16  WBC 13.0*  < > 7.5  < > 5.3  < > 6.6  < > 10.7* 11.9* 11.0 8.1  NEUTROABS 11.0*  --  5.7  --  3.3  --   --   --   --   --   --   --   HGB 9.3*  < > 9.0*  < > 9.3*  < > 9.4*  --  9.4* 9.4* 9.9* 9.6*  HCT 30.2*  < > 28.4*  < > 29.8*  < > 30.9*  --  30.7* 30.0* 31* 30*  MCV 89.3  --  85.5  < > 85.4  < > 87.8  --  87.2 85.2  --   --   PLT 136*  < > 165  < > 268  < > 219  --  209 198 219 229  < > = values in this interval not displayed. Cardiac Enzymes: No results for input(s): CKTOTAL, CKMB, CKMBINDEX, TROPONINI in the last 8760 hours. BNP: Invalid input(s): POCBNP CBG:  Recent Labs  05/11/16 2021 05/12/16 0732 05/12/16 1217  GLUCAP 138* 115* 139*    Radiological Exams: Ct Abdomen Pelvis Wo Contrast  05/06/2016  CLINICAL DATA:  Patient has had Celsius diff and diarrhea for 30 days. Increasing mid abdominal pain. EXAM: CT ABDOMEN AND PELVIS WITHOUT CONTRAST TECHNIQUE: Multidetector CT imaging of the abdomen and pelvis was performed following the standard protocol without IV contrast. COMPARISON:  None. FINDINGS: A dialysis catheter, entering via a left femoral approach, terminates in the right atrium. Cardiomegaly is identified. Small bilateral effusions are identified with underlying atelectasis. No other lung base abnormalities. Increased attenuation in the subcutaneous fat is consistent with volume overload. Ascites is seen in the abdomen. No free air. Evaluation of the colon is limited due to lack of distention with contrast. However, given the history of Clostridium difficile, there does appear to be colonic wall thickening extending from the distal transverse colon through the descending colon. There may also be thickening of the colon wall in the ascending and proximal transverse colon. The stomach and small bowel are unremarkable. Cholelithiasis is seen in the  gallbladder. The liver, spleen, adrenal glands, and pancreas are unremarkable. The kidneys are unremarkable. No suspicious adenopathy. The pelvis demonstrates a left femoral line. No suspicious masses or adenopathy. Free fluid identified. No acute bony abnormalities. IMPRESSION: 1. The study is limited due to lack of contrast in addition to volume overload resulting in increased attenuation in the fat diffusely, pleural effusions, and ascites. 2. Evaluation of the colon is limited but I do suspect colonic wall thickening as described above consistent with the history of Clostridium difficile. 3. No other acute abnormalities. Electronically Signed   By: Gerome Sam III M.D   On: 05/06/2016 14:13    Assessment/Plan  Loose stool Recently completed her antibiotic for c.diff. Send stool c.diff pcr and stool for wbc and culture. Hydration to be maintained.   Umbilical hernia With pain with cough and straining. Reducible. Get general surgery consult to evaluate further.  Abdominal pain Her umbilical hernia, ascites and possible c.diff could all be contributing to this at present. No signs of hernia strangulation and bowel obstruction on exam. Get surgery referral as above. Get abdominal ultrasound to assess for ascitic fluid and need for therapeutic paracentesis. Send stool study as above. Check cmp, amylase and lipase.    Labs/tests ordered: cmp, amylase, lipase stool pcr for cdiff, stool for wbc and culture  Family/ staff Communication: reviewed care plan with patient and nursing supervisor    [name], MD Internal Medicine [facility] [address] [place], [place] [address] Cell Phone (Monday-Friday 8 am - 5 pm): [phone] On Call: [phone] and follow prompts after 5 pm and on weekends Office Phone: [phone] Office Fax: [phone]

## 2016-06-01 LAB — BASIC METABOLIC PANEL
BUN: 52 mg/dL — AB (ref 4–21)
CREATININE: 5.6 mg/dL — AB (ref 0.5–1.1)
Glucose: 103 mg/dL
POTASSIUM: 3.6 mmol/L (ref 3.4–5.3)
SODIUM: 143 mmol/L (ref 137–147)

## 2016-06-01 LAB — HEPATIC FUNCTION PANEL
ALK PHOS: 234 U/L — AB (ref 25–125)
ALT: 8 U/L (ref 7–35)
AST: 15 U/L (ref 13–35)
Bilirubin, Total: 1.4 mg/dL

## 2016-06-01 LAB — CBC AND DIFFERENTIAL
HCT: 31 % — AB (ref 36–46)
Hemoglobin: 9.7 g/dL — AB (ref 12.0–16.0)
PLATELETS: 266 10*3/uL (ref 150–399)
WBC: 5.4 10^3/mL

## 2016-06-07 ENCOUNTER — Encounter: Payer: Self-pay | Admitting: Internal Medicine

## 2016-06-07 ENCOUNTER — Non-Acute Institutional Stay: Payer: Medicare PPO | Admitting: Internal Medicine

## 2016-06-07 DIAGNOSIS — R188 Other ascites: Secondary | ICD-10-CM | POA: Diagnosis not present

## 2016-06-07 DIAGNOSIS — A047 Enterocolitis due to Clostridium difficile: Secondary | ICD-10-CM | POA: Diagnosis not present

## 2016-06-07 DIAGNOSIS — J449 Chronic obstructive pulmonary disease, unspecified: Secondary | ICD-10-CM | POA: Diagnosis not present

## 2016-06-07 DIAGNOSIS — R0601 Orthopnea: Secondary | ICD-10-CM | POA: Diagnosis not present

## 2016-06-07 DIAGNOSIS — K429 Umbilical hernia without obstruction or gangrene: Secondary | ICD-10-CM

## 2016-06-07 DIAGNOSIS — R748 Abnormal levels of other serum enzymes: Secondary | ICD-10-CM

## 2016-06-07 DIAGNOSIS — A0472 Enterocolitis due to Clostridium difficile, not specified as recurrent: Secondary | ICD-10-CM

## 2016-06-07 NOTE — Progress Notes (Signed)
LOCATION: [facility]  PCP: [name], MD   Code Status: Full Code  Goals of care: Advanced Directive information Advanced Directives 05/06/2016  Does patient have an advance directive? Yes  Type of Advance Directive Healthcare Power of Attorney  Does patient want to make changes to advanced directive? -  Copy of advanced directive(s) in chart? Yes       Extended Emergency Contact Information Primary Emergency Contact: Haith,[name] Address: [address]          [place], [place] [address] D[place] of [place] Home Phone: [phone] Relation: Sister Secondary Emergency Contact: Marcello Moores, [place] Home Phone: [phone] Relation: Sister   Allergies  Allergen Reactions  . Entresto [Sacubitril-Valsartan] Other (See Comments)    Not listed on [facility] 05/06/16 - unknown reaction  . Sulfa Antibiotics Itching    Chief Complaint  Patient presents with  . Acute Visit    diarrhea, abdominal pain     HPI:  Patient is a 65 y.o. female seen today for acute visit. She has been diagnosed with c.diff again with her stool PCR positive. She has ongoing abdominal discomfort. abdominal ultrasound has resulted with ascites present. She is seen in her room today. She had a bowel movement yesterday with semi-formed stool. She has not had a bowel movement today. She has abdominal pain around her umbilicus with cough and straining. She has history of umbilical hernia. She is being treated for c.diff for the third time now. Reviewed labs with normal pancreatic enzyme, wbc among others.   Review of Systems:  Constitutional: Negative for fever, chills. energy level is somewhat improving.  HENT: Negative for headache, congestion. Eyes: Negative for blurred vision, double vision and discharge.  Respiratory: Negative for wheezing. Positive for dry cough and orthopnea Cardiovascular: Negative for chest pain, palpitations, leg swelling.    Gastrointestinal: Negative for heartburn, nausea, vomiting. Positive for abdominal pain.  Genitourinary: Negative for dysuria and flank pain. Minimal urine. Musculoskeletal: Negative for back pain, fall in the facility.  Skin: Negative for itching, rash.  Neurological: Negative for dizziness. Psychiatric/Behavioral: Negative for depression   Past Medical History  Diagnosis Date  . Coronary artery disease   . CHF (congestive heart failure) (HCC)     systolic  . COPD (chronic obstructive pulmonary disease) (HCC)   . Hypertension   . Renal disorder   . ESRD (end stage renal disease) (HCC)     tues/thurs/sat dialysis  . DVT (deep venous thrombosis) (HCC)     on coumadin  . C. difficile colitis   . Diabetes mellitus (HCC)   . Obstructive sleep apnea   . Anemia    Past Surgical History  Procedure Laterality Date  . Insertion of dialysis catheter Left 04/03/2016    Procedure: INSERTION OF DIALYSIS CATHETER;  Surgeon: Larina Earthly, MD;  Location: [facility] OR;  Service: Vascular;  Laterality: Left;  . Av fistula placement Left 04/03/2016    Procedure: ARTERIOVENOUS (AV) FISTULA CREATION;  Surgeon: Larina Earthly, MD;  Location: [facility] OR;  Service: Vascular;  Laterality: Left;   Social History:   reports that she quit smoking about 10 years ago. Her smoking use included Cigarettes. She has never used smokeless tobacco. She reports that she does not drink alcohol or use illicit drugs.  No family history on file.  Medications:   Medication List       This list is accurate as of:  06/07/16  1:12 PM.  Always use your most recent med list.               acetaminophen 325 MG tablet  Commonly known as:  TYLENOL  Take 325 mg by mouth every 6 (six) hours as needed for mild pain.     PROAIR HFA 108 (90 Base) MCG/ACT inhaler  Generic drug:  albuterol  Inhale 2 puffs into the lungs 4 (four) times daily as needed for wheezing or shortness of breath.     albuterol (2.5 MG/3ML) 0.083% nebulizer  solution  Commonly known as:  PROVENTIL  Take 3 mLs (2.5 mg total) by nebulization every 6 (six) hours as needed for wheezing or shortness of breath.     atorvastatin 10 MG tablet  Commonly known as:  LIPITOR  Take 10 mg by mouth at bedtime.     budesonide 0.5 MG/2ML nebulizer solution  Commonly known as:  PULMICORT  Take 2 mLs (0.5 mg total) by nebulization 2 (two) times daily.     carvedilol 3.125 MG tablet  Commonly known as:  COREG  Take 3.125 mg by mouth 2 (two) times daily with a meal. Reported on 05/31/2016     CERTAVITE/ANTIOXIDANTS Tabs  Take 1 tablet by mouth at bedtime.     famotidine 20 MG tablet  Commonly known as:  PEPCID  Take 20 mg by mouth daily.     FIBER CHOICE PO  Take 2 tablets by mouth daily as needed (constipation).     hydrOXYzine 50 MG tablet  Commonly known as:  ATARAX/VISTARIL  Take 50 mg by mouth every 6 (six) hours as needed for itching.     insulin glargine 100 unit/mL Sopn  Commonly known as:  LANTUS  Inject 0.1 mLs (10 Units total) into the skin at bedtime.     insulin lispro 100 UNIT/ML injection  Commonly known as:  HUMALOG  Inject 5 Units into the skin 3 (three) times daily as needed for high blood sugar (CBG >180).     LORazepam 0.5 MG tablet  Commonly known as:  ATIVAN  Take 0.5 mg by mouth 2 (two) times daily as needed for anxiety.     OXYGEN  Inhale 2 L into the lungs at bedtime.     saccharomyces boulardii 250 MG capsule  Commonly known as:  FLORASTOR  Take 250 mg by mouth 2 (two) times daily. For recurrent C. Diff     vancomycin 125 MG capsule  Commonly known as:  VANCOCIN  Take 125 mg by mouth every 6 (six) hours. Stop date 06/15/16        Immunizations: Immunization History  Administered Date(s) Administered  . PPD Test 03/01/2016, 04/10/2016     Physical Exam: Filed Vitals:   06/07/16 1043  BP: 102/66  Pulse: 72  Temp: 98.4 F (36.9 C)  TempSrc: Oral  Resp: 20  Height: 5\' 8"  (1.727 m)  Weight: 217 lb  (98.431 kg)  SpO2: 96%   Body mass index is 33 kg/(m^2).  General- elderly female, obese, in no acute distress Head- normocephalic, atraumatic Throat- moist mucus membrane Eyes- PERRLA, EOMI, no pallor, no icterus Cardiovascular- normal s1,s2, no murmur, trace leg edema Respiratory- bilateral poor air entry, no wheeze, no rhonchi, no crackles, no use of accessory muscles Abdomen- bowel sounds present, soft and distended, tenderness around her umbilical area with protrusion of hernia with coughing that is reducible, tenderness to epigastric area, ascites present Musculoskeletal- able to move all 4 extremities, generalized weakness  Neurological- alert and oriented to person, place and time Skin- warm and dry Psychiatry- normal mood and affect     Labs reviewed: Basic Metabolic Panel:  Recent Labs  16/10/96 0410  04/03/16 1423  05/08/16 0436  05/11/16 1231 05/11/16 1643  05/12/16 0805 05/16/16 05/23/16 06/01/16  NA 137  < >  --   < > 137  < > 137 135  < > 135 141 143 143  K 3.5  < >  --   < > 3.5  < > 4.2 2.8*  --  3.6 3.9 4.3 3.6  CL 102  < >  --   < > 103  < > 101 99*  --  101  --   --   --   CO2 27  < >  --   < > 25  < > 27 29  --  27  --   --   --   GLUCOSE 238*  < >  --   < > 76  < > 154* 108*  --  109*  --   --   --   BUN 26*  < >  --   < > 28*  < > 32* 14  < > 20 37* 36* 52*  CREATININE 2.66*  < >  --   < > 6.52*  < > 6.71* 3.91*  < > 4.89* 5.9* 4.6* 5.6*  CALCIUM 8.0*  < >  --   < > 8.0*  < > 8.3* 8.0*  --  8.3*  --   --   --   MG 2.2  --  2.1  --  1.9  --   --   --   --   --   --   --   --   PHOS 2.7  < >  --   < >  --   < > 3.0 1.7*  --  2.6  --   --   --   < > = values in this interval not displayed. Liver Function Tests:  Recent Labs  03/04/16  03/21/16 0807  05/06/16 1240  05/11/16 1231 05/11/16 1643 05/12/16 0805 05/16/16 05/23/16 06/01/16  AST 43*  < > 22  < > 18  --   --   --   --  ALT 15  < > 11*  < > 11*  --   --   --   --  ALKPHOS 207*  < > 110  < > 180*  --   --   --   --  196* 222* 234*  BILITOT 2.3*  --  1.5*  --  1.5*  --   --   --   --   --   --   --   PROT 6.9  --  5.7*  --  7.3  --   --   --   --   --   --   --   ALBUMIN 2.9*  --  2.1*  < > 2.4*  < > 2.2* 2.2* 2.3*  --   --   --   < > = values in this interval not displayed.  Recent Labs  03/04/16 05/06/16 1240  LIPASE 44 37   No results for input(s): AMMONIA in the last 8760 hours. CBC:  Recent Labs  03/14/16 1457  03/20/16 1227  05/06/16 1240  05/09/16 0724  05/11/16 1231 05/11/16 1643  05/16/16 05/23/16 06/01/16  WBC 13.0*  < > 7.5  < > 5.3  < > 6.6  < > 10.7* 11.9* 11.0 8.1 5.4  NEUTROABS 11.0*  --  5.7  --  3.3  --   --   --   --   --   --   --   --   HGB 9.3*  < > 9.0*  < > 9.3*  < > 9.4*  --  9.4* 9.4* 9.9* 9.6* 9.7*  HCT 30.2*  < > 28.4*  < > 29.8*  < > 30.9*  --  30.7* 30.0* 31* 30* 31*  MCV 89.3  --  85.5  < > 85.4  < > 87.8  --  87.2 85.2  --   --   --   PLT 136*  < > 165  < > 268  < > 219  --  209 198 219 229 266  < > = values in this interval not displayed. Cardiac Enzymes: No results for input(s): CKTOTAL, CKMB, CKMBINDEX, TROPONINI in the last 8760 hours. BNP: Invalid input(s): POCBNP CBG:  Recent Labs  05/11/16 2021 05/12/16 0732 05/12/16 1217  GLUCAP 138* 115* 139*    Radiological Exams: US Abdomen 06/02/16 Impression. Sludge in GB lumen without gallstones. Ascites fluid seen and right pleural effusion present.  Assessment/Plan  C.diff colitis Second relapse this time. Will have her on slow taper of po vancomycin 250 mg qid x 2 weeks, then 250 mg bid x 1 week, then 250 mg daily x 1 week, 250 mg qod x 1 week and then 250 mg q3d x 2 weeks. Hydration to be maintained. Add florastor 250 mg bid until 2 weeks after completion of antibiotics. Will get GI referral to assess for need for possible colonoscopy. To consider difficid if no improvement.   Ascites This likely is contributing to some of her abdominal  discomfort. Has deranged ALP with biliary sludge. Refer to GI for need for possible ERCP and diagnostic/ therapeutic paracentesis  Elevated ALP Biliary sludge without gallstones on ultrasound. Has ascites and has normal liver morphology on ultrasound. Get GI referral. Other liver enzymes WNL.   Orthopnea Right sided pleural effusion and ascites noted on ultrasound. These along with her hx of chf could be contributing some. Continue HD 3 days a week. Continue o2.   COPD Refusing o2. Wean o2 to room air for o2 sat > 90. Continue proair and pulmicort  Umbilical hernia With pain with cough and straining. Reducible. pending general surgery consult    Family/ staff Communication: reviewed care plan with patient and nursing supervisor    [name], MD Internal Medicine [facility] [address] [place], [place] [address] Cell Phone (Monday-Friday 8 am - 5 pm): [phone] On Call: [phone] and follow prompts after 5 pm and on weekends Office Phone: [phone] Office Fax: [phone]

## 2016-06-08 ENCOUNTER — Encounter: Payer: Self-pay | Admitting: Vascular Surgery

## 2016-06-13 ENCOUNTER — Other Ambulatory Visit: Payer: Self-pay

## 2016-06-13 ENCOUNTER — Encounter: Payer: Self-pay | Admitting: Vascular Surgery

## 2016-06-13 ENCOUNTER — Ambulatory Visit (HOSPITAL_COMMUNITY)
Admission: RE | Admit: 2016-06-13 | Discharge: 2016-06-13 | Disposition: A | Discharge status: Home / Self Care | Payer: Medicare PPO | Source: Ambulatory Visit | Attending: Vascular Surgery | Admitting: Vascular Surgery

## 2016-06-13 ENCOUNTER — Ambulatory Visit (INDEPENDENT_AMBULATORY_CARE_PROVIDER_SITE_OTHER): Payer: Medicare PPO | Admitting: Vascular Surgery

## 2016-06-13 VITALS — BP 105/66 | HR 76 | Temp 97.3°F | Resp 28 | Ht 64.0 in | Wt 206.0 lb

## 2016-06-13 DIAGNOSIS — Z48812 Encounter for surgical aftercare following surgery on the circulatory system: Secondary | ICD-10-CM

## 2016-06-13 DIAGNOSIS — J449 Chronic obstructive pulmonary disease, unspecified: Secondary | ICD-10-CM | POA: Insufficient documentation

## 2016-06-13 DIAGNOSIS — E1122 Type 2 diabetes mellitus with diabetic chronic kidney disease: Secondary | ICD-10-CM | POA: Diagnosis not present

## 2016-06-13 DIAGNOSIS — D649 Anemia, unspecified: Secondary | ICD-10-CM | POA: Diagnosis not present

## 2016-06-13 DIAGNOSIS — I132 Hypertensive heart and chronic kidney disease with heart failure and with stage 5 chronic kidney disease, or end stage renal disease: Secondary | ICD-10-CM | POA: Insufficient documentation

## 2016-06-13 DIAGNOSIS — Z992 Dependence on renal dialysis: Secondary | ICD-10-CM

## 2016-06-13 DIAGNOSIS — I5020 Unspecified systolic (congestive) heart failure: Secondary | ICD-10-CM | POA: Diagnosis not present

## 2016-06-13 DIAGNOSIS — I2510 Atherosclerotic heart disease of native coronary artery without angina pectoris: Secondary | ICD-10-CM | POA: Diagnosis not present

## 2016-06-13 DIAGNOSIS — N186 End stage renal disease: Secondary | ICD-10-CM | POA: Diagnosis present

## 2016-06-13 DIAGNOSIS — G4733 Obstructive sleep apnea (adult) (pediatric): Secondary | ICD-10-CM | POA: Insufficient documentation

## 2016-06-13 NOTE — Progress Notes (Signed)
 Patient name: Brandy Dudley MRN: [id number] DOB: 06/08/1951 Sex: female  REASON FOR VISIT: Follow-up left upper arm brachiocephalic AV fistula creation on 04/03/2016  HPI: Brandy Dudley is a 64 y.o. female today for follow-up of her creation of left upper arm AV fistula. She also had placement of a left femoral catheter same setting. She reports that the catheter is working well.  Current Outpatient Prescriptions  Medication Sig Dispense Refill  . acetaminophen (TYLENOL) 325 MG tablet Take 325 mg by mouth every 6 (six) hours as needed for mild pain.     . albuterol (PROAIR HFA) 108 (90 Base) MCG/ACT inhaler Inhale 2 puffs into the lungs 4 (four) times daily as needed for wheezing or shortness of breath.    . albuterol (PROVENTIL) (2.5 MG/3ML) 0.083% nebulizer solution Take 3 mLs (2.5 mg total) by nebulization every 6 (six) hours as needed for wheezing or shortness of breath. 75 mL 3  . atorvastatin (LIPITOR) 10 MG tablet Take 10 mg by mouth at bedtime.     . budesonide (PULMICORT) 0.5 MG/2ML nebulizer solution Take 2 mLs (0.5 mg total) by nebulization 2 (two) times daily. 250 mL 3  . carvedilol (COREG) 3.125 MG tablet Take 3.125 mg by mouth 2 (two) times daily with a meal. Reported on 05/31/2016    . famotidine (PEPCID) 20 MG tablet Take 20 mg by mouth daily.    . hydrOXYzine (ATARAX/VISTARIL) 50 MG tablet Take 50 mg by mouth every 6 (six) hours as needed for itching.    . insulin glargine (LANTUS) 100 unit/mL SOPN Inject 0.1 mLs (10 Units total) into the skin at bedtime. 15 mL 0  . insulin lispro (HUMALOG) 100 UNIT/ML injection Inject 5 Units into the skin 3 (three) times daily as needed for high blood sugar (CBG >180).     . Inulin (FIBER CHOICE PO) Take 2 tablets by mouth daily as needed (constipation).    . LORazepam (ATIVAN) 0.5 MG tablet Take 0.5 mg by mouth 2 (two) times daily as needed for anxiety.    . Multiple Vitamins-Minerals (CERTAVITE/ANTIOXIDANTS) TABS  Take 1 tablet by mouth at bedtime.    . OXYGEN Inhale 2 L into the lungs at bedtime.    . saccharomyces boulardii (FLORASTOR) 250 MG capsule Take 250 mg by mouth 2 (two) times daily. For recurrent C. Diff    . vancomycin (VANCOCIN) 125 MG capsule Take 125 mg by mouth every 6 (six) hours. Stop date 06/15/16     No current facility-administered medications for this visit.      PHYSICAL EXAM: Filed Vitals:   06/13/16 0951  BP: 105/66  Pulse: 76  Temp: 97.3 F (36.3 C)  TempSrc: Oral  Resp: 28  Height: 5' 4" (1.626 m)  Weight: 206 lb (93.441 kg)  SpO2: 95%    GENERAL: The patient is a well-nourished female, in no acute distress. The vital signs are documented above. Her left antecubital incision is well-healed. She does have a good thrill through her vein. She has multiple large vein branches and tortuosity of her cephalic vein.  Duplex of her fistula today shows good size maturation of her fistula. There is some tortuosity in their multiple very large competing branches.  MEDICAL ISSUES: 2 month status post left arm AV fistula creation. Feel that that she will require ligation of several large competing branches and potentially straightening of her tortuosity. We would do mobilizes to the surface at the same setting. I recommend this as an outpatient at the hospital   at her earliest convenience  Larina Earthly, MD [facility] Tel [phone] Pager [phone]

## 2016-06-15 ENCOUNTER — Encounter (HOSPITAL_COMMUNITY): Payer: Self-pay | Admitting: *Deleted

## 2016-06-15 ENCOUNTER — Encounter: Payer: Self-pay | Admitting: Family

## 2016-06-15 NOTE — Progress Notes (Signed)
Pt resides at [facility]. Spoke with Roe Coombs, LPN for pre-op call. He states pt has not complained of any recent chest pain or sob. Pt is being treated for C-diff but is no longer having diarrhea. Pt is diabetic, last A1C was 6.4 on 03/17/16. Don states pt's fasting blood sugar is usually between 80-120. Instructed Don to have pt receive 5 units of Lantus this evening (1/2 of regular dose of 10 units). If blood sugar is >220 take 1/2 of usual correction dose of Humalog insulin. If blood sugar is 70 or below, treat with 1/2 cup of clear juice (apple or cranberry) and recheck blood sugar 15 minutes after drinking juice. If blood sugar continues to be 70 or below, call the Short Stay department and ask to speak to a nurse.  Per Roe Coombs, pt will be arriving via [facility]'s transportation.

## 2016-06-16 ENCOUNTER — Other Ambulatory Visit: Payer: Self-pay

## 2016-06-16 ENCOUNTER — Encounter (HOSPITAL_COMMUNITY): Payer: Self-pay | Admitting: *Deleted

## 2016-06-16 ENCOUNTER — Ambulatory Visit (HOSPITAL_COMMUNITY): Payer: Medicare PPO | Admitting: Anesthesiology

## 2016-06-16 ENCOUNTER — Ambulatory Visit (HOSPITAL_COMMUNITY)
Admission: RE | Admit: 2016-06-16 | Discharge: 2016-06-16 | Disposition: A | Discharge status: Skilled Nursing Facility | Payer: Medicare PPO | Source: Ambulatory Visit | Attending: Vascular Surgery | Admitting: Vascular Surgery

## 2016-06-16 ENCOUNTER — Encounter (HOSPITAL_COMMUNITY)
Admission: RE | Disposition: A | Discharge status: Skilled Nursing Facility | Payer: Self-pay | Source: Ambulatory Visit | Attending: Vascular Surgery

## 2016-06-16 ENCOUNTER — Encounter: Payer: Self-pay | Admitting: Family

## 2016-06-16 DIAGNOSIS — I2510 Atherosclerotic heart disease of native coronary artery without angina pectoris: Secondary | ICD-10-CM | POA: Diagnosis not present

## 2016-06-16 DIAGNOSIS — N186 End stage renal disease: Secondary | ICD-10-CM

## 2016-06-16 DIAGNOSIS — Y832 Surgical operation with anastomosis, bypass or graft as the cause of abnormal reaction of the patient, or of later complication, without mention of misadventure at the time of the procedure: Secondary | ICD-10-CM | POA: Insufficient documentation

## 2016-06-16 DIAGNOSIS — T82898A Other specified complication of vascular prosthetic devices, implants and grafts, initial encounter: Secondary | ICD-10-CM | POA: Insufficient documentation

## 2016-06-16 DIAGNOSIS — E119 Type 2 diabetes mellitus without complications: Secondary | ICD-10-CM | POA: Insufficient documentation

## 2016-06-16 DIAGNOSIS — Z7951 Long term (current) use of inhaled steroids: Secondary | ICD-10-CM | POA: Insufficient documentation

## 2016-06-16 DIAGNOSIS — Z992 Dependence on renal dialysis: Secondary | ICD-10-CM | POA: Diagnosis not present

## 2016-06-16 DIAGNOSIS — Z87891 Personal history of nicotine dependence: Secondary | ICD-10-CM | POA: Diagnosis not present

## 2016-06-16 DIAGNOSIS — G4730 Sleep apnea, unspecified: Secondary | ICD-10-CM | POA: Diagnosis not present

## 2016-06-16 DIAGNOSIS — Z48812 Encounter for surgical aftercare following surgery on the circulatory system: Secondary | ICD-10-CM

## 2016-06-16 DIAGNOSIS — I120 Hypertensive chronic kidney disease with stage 5 chronic kidney disease or end stage renal disease: Secondary | ICD-10-CM | POA: Diagnosis not present

## 2016-06-16 DIAGNOSIS — J449 Chronic obstructive pulmonary disease, unspecified: Secondary | ICD-10-CM | POA: Insufficient documentation

## 2016-06-16 DIAGNOSIS — Z95820 Peripheral vascular angioplasty status with implants and grafts: Secondary | ICD-10-CM | POA: Diagnosis not present

## 2016-06-16 DIAGNOSIS — I252 Old myocardial infarction: Secondary | ICD-10-CM | POA: Insufficient documentation

## 2016-06-16 DIAGNOSIS — Z794 Long term (current) use of insulin: Secondary | ICD-10-CM | POA: Diagnosis not present

## 2016-06-16 DIAGNOSIS — Z79899 Other long term (current) drug therapy: Secondary | ICD-10-CM | POA: Insufficient documentation

## 2016-06-16 DIAGNOSIS — Z6835 Body mass index (BMI) 35.0-35.9, adult: Secondary | ICD-10-CM | POA: Diagnosis not present

## 2016-06-16 HISTORY — PX: FISTULA SUPERFICIALIZATION: SHX6341

## 2016-06-16 HISTORY — DX: Presence of automatic (implantable) cardiac defibrillator: Z95.810

## 2016-06-16 HISTORY — PX: REVISION OF ARTERIOVENOUS GORETEX GRAFT: SHX6073

## 2016-06-16 HISTORY — PX: LIGATION OF COMPETING BRANCHES OF ARTERIOVENOUS FISTULA: SHX5949

## 2016-06-16 LAB — GLUCOSE, CAPILLARY
GLUCOSE-CAPILLARY: 91 mg/dL (ref 65–99)
GLUCOSE-CAPILLARY: 99 mg/dL (ref 65–99)
Glucose-Capillary: 87 mg/dL (ref 65–99)

## 2016-06-16 LAB — POCT I-STAT 4, (NA,K, GLUC, HGB,HCT)
Glucose, Bld: 93 mg/dL (ref 65–99)
HEMATOCRIT: 35 % — AB (ref 36.0–46.0)
Hemoglobin: 11.9 g/dL — ABNORMAL LOW (ref 12.0–15.0)
Potassium: 4.5 mmol/L (ref 3.5–5.1)
SODIUM: 139 mmol/L (ref 135–145)

## 2016-06-16 SURGERY — LIGATION OF COMPETING BRANCHES OF ARTERIOVENOUS FISTULA
Anesthesia: Monitor Anesthesia Care | Site: Arm Upper | Laterality: Left

## 2016-06-16 MED ORDER — LIDOCAINE HCL (PF) 1 % IJ SOLN
INTRAMUSCULAR | Status: AC
  Filled 2016-06-16: qty 30

## 2016-06-16 MED ORDER — PROTAMINE SULFATE 10 MG/ML IV SOLN
INTRAVENOUS | Status: DC | PRN
  Administered 2016-06-16 (×4): 10 mg via INTRAVENOUS

## 2016-06-16 MED ORDER — OXYCODONE-ACETAMINOPHEN 5-325 MG PO TABS
1.0000 | ORAL_TABLET | Freq: Once | ORAL | Status: AC
  Administered 2016-06-16: 2 via ORAL

## 2016-06-16 MED ORDER — CHLORHEXIDINE GLUCONATE CLOTH 2 % EX PADS: 6.0000 | MEDICATED_PAD | Freq: Once | CUTANEOUS | Status: DC

## 2016-06-16 MED ORDER — HEPARIN SODIUM (PORCINE) 1000 UNIT/ML IJ SOLN
INTRAMUSCULAR | Status: DC | PRN
  Administered 2016-06-16: 7000 [IU] via INTRAVENOUS

## 2016-06-16 MED ORDER — MIDAZOLAM HCL 2 MG/2ML IJ SOLN
0.5000 mg | Freq: Once | INTRAMUSCULAR | Status: AC | PRN
  Administered 2016-06-16: 0.5 mg via INTRAVENOUS

## 2016-06-16 MED ORDER — ONDANSETRON HCL 4 MG/2ML IJ SOLN
INTRAMUSCULAR | Status: AC
  Filled 2016-06-16: qty 2

## 2016-06-16 MED ORDER — SODIUM CHLORIDE 0.9 % IV SOLN
INTRAVENOUS | Status: DC | PRN
  Administered 2016-06-16: 500 mL

## 2016-06-16 MED ORDER — OXYCODONE-ACETAMINOPHEN 5-325 MG PO TABS: 1.0000 | ORAL_TABLET | ORAL | Status: DC | PRN

## 2016-06-16 MED ORDER — FENTANYL CITRATE (PF) 250 MCG/5ML IJ SOLN
INTRAMUSCULAR | Status: AC
  Filled 2016-06-16: qty 5

## 2016-06-16 MED ORDER — LIDOCAINE HCL (CARDIAC) 20 MG/ML IV SOLN
INTRAVENOUS | Status: DC | PRN
  Administered 2016-06-16: 50 mg via INTRAVENOUS

## 2016-06-16 MED ORDER — FENTANYL CITRATE (PF) 100 MCG/2ML IJ SOLN
INTRAMUSCULAR | Status: DC | PRN
  Administered 2016-06-16 (×2): 25 ug via INTRAVENOUS

## 2016-06-16 MED ORDER — MIDAZOLAM HCL 2 MG/2ML IJ SOLN
INTRAMUSCULAR | Status: AC
  Filled 2016-06-16: qty 2

## 2016-06-16 MED ORDER — PHENYLEPHRINE HCL 10 MG/ML IJ SOLN
INTRAMUSCULAR | Status: DC | PRN
  Administered 2016-06-16 (×3): 40 ug via INTRAVENOUS

## 2016-06-16 MED ORDER — HEPARIN SODIUM (PORCINE) 1000 UNIT/ML IJ SOLN
INTRAMUSCULAR | Status: AC
  Filled 2016-06-16: qty 1

## 2016-06-16 MED ORDER — MEPERIDINE HCL 25 MG/ML IJ SOLN: 6.2500 mg | INTRAMUSCULAR | Status: DC | PRN

## 2016-06-16 MED ORDER — ONDANSETRON HCL 4 MG/2ML IJ SOLN
INTRAMUSCULAR | Status: DC | PRN
  Administered 2016-06-16: 4 mg via INTRAVENOUS

## 2016-06-16 MED ORDER — OXYCODONE-ACETAMINOPHEN 5-325 MG PO TABS
ORAL_TABLET | ORAL | Status: AC
  Administered 2016-06-16: 2 via ORAL
  Filled 2016-06-16: qty 2

## 2016-06-16 MED ORDER — MIDAZOLAM HCL 2 MG/2ML IJ SOLN
INTRAMUSCULAR | Status: AC
  Administered 2016-06-16: 0.5 mg via INTRAVENOUS
  Filled 2016-06-16: qty 2

## 2016-06-16 MED ORDER — MIDAZOLAM HCL 5 MG/5ML IJ SOLN
INTRAMUSCULAR | Status: DC | PRN
  Administered 2016-06-16: 0.5 mg via INTRAVENOUS

## 2016-06-16 MED ORDER — PHENYLEPHRINE 40 MCG/ML (10ML) SYRINGE FOR IV PUSH (FOR BLOOD PRESSURE SUPPORT)
PREFILLED_SYRINGE | INTRAVENOUS | Status: AC
  Filled 2016-06-16: qty 10

## 2016-06-16 MED ORDER — PROPOFOL 500 MG/50ML IV EMUL
INTRAVENOUS | Status: DC | PRN
  Administered 2016-06-16: 10 ug/kg/min via INTRAVENOUS

## 2016-06-16 MED ORDER — 0.9 % SODIUM CHLORIDE (POUR BTL) OPTIME
TOPICAL | Status: DC | PRN
  Administered 2016-06-16: 1000 mL

## 2016-06-16 MED ORDER — LIDOCAINE HCL (PF) 1 % IJ SOLN
INTRAMUSCULAR | Status: DC | PRN
Start: 2016-06-16 — End: 2016-06-16
  Administered 2016-06-16: 30 mL via INTRADERMAL
  Administered 2016-06-16: 4 mL via INTRADERMAL

## 2016-06-16 MED ORDER — LIDOCAINE 2% (20 MG/ML) 5 ML SYRINGE
INTRAMUSCULAR | Status: AC
  Filled 2016-06-16: qty 5

## 2016-06-16 MED ORDER — PROMETHAZINE HCL 25 MG/ML IJ SOLN: 6.2500 mg | INTRAMUSCULAR | Status: DC | PRN

## 2016-06-16 MED ORDER — SODIUM CHLORIDE 0.9 % IV SOLN
INTRAVENOUS | Status: DC
Start: 2016-06-16 — End: 2016-06-16
  Administered 2016-06-16: 10:00:00 via INTRAVENOUS

## 2016-06-16 MED ORDER — DEXTROSE 5 % IV SOLN
1.5000 g | INTRAVENOUS | Status: AC
  Administered 2016-06-16: 1.5 g via INTRAVENOUS
  Filled 2016-06-16: qty 1.5

## 2016-06-16 SURGICAL SUPPLY — 39 items
BENZOIN TINCTURE PRP APPL 2/3 (GAUZE/BANDAGES/DRESSINGS) IMPLANT
CANISTER SUCTION 2500CC (MISCELLANEOUS) ×3 IMPLANT
CANNULA VESSEL 3MM 2 BLNT TIP (CANNULA) ×3 IMPLANT
CLIP LIGATING EXTRA MED SLVR (CLIP) IMPLANT
CLIP LIGATING EXTRA SM BLUE (MISCELLANEOUS) ×3 IMPLANT
CLIP TI MEDIUM 6 (CLIP) ×3 IMPLANT
CLIP TI WIDE RED SMALL 6 (CLIP) ×6 IMPLANT
CLOSURE WOUND 1/2 X4 (GAUZE/BANDAGES/DRESSINGS)
CORDS BIPOLAR (ELECTRODE) ×3 IMPLANT
DECANTER SPIKE VIAL GLASS SM (MISCELLANEOUS) IMPLANT
ELECT REM PT RETURN 9FT ADLT (ELECTROSURGICAL) ×3
ELECTRODE REM PT RTRN 9FT ADLT (ELECTROSURGICAL) ×1 IMPLANT
GAUZE SPONGE 4X4 12PLY STRL (GAUZE/BANDAGES/DRESSINGS) IMPLANT
GEL ULTRASOUND 20GR AQUASONIC (MISCELLANEOUS) IMPLANT
GLOVE SS BIOGEL STRL SZ 7.5 (GLOVE) IMPLANT
GLOVE SUPERSENSE BIOGEL SZ 7.5 (GLOVE)
GOWN STRL REUS W/ TWL LRG LVL3 (GOWN DISPOSABLE) ×3 IMPLANT
GOWN STRL REUS W/TWL LRG LVL3 (GOWN DISPOSABLE) ×6
KIT BASIN OR (CUSTOM PROCEDURE TRAY) ×3 IMPLANT
KIT ROOM TURNOVER OR (KITS) IMPLANT
LIQUID BAND (GAUZE/BANDAGES/DRESSINGS) ×3 IMPLANT
NS IRRIG 1000ML POUR BTL (IV SOLUTION) ×3 IMPLANT
PACK CV ACCESS (CUSTOM PROCEDURE TRAY) ×3 IMPLANT
PAD ARMBOARD 7.5X6 YLW CONV (MISCELLANEOUS) ×6 IMPLANT
PAD ONESTEP ZOLL R SERIES ADT (MISCELLANEOUS) ×3 IMPLANT
STAPLER VISISTAT 35W (STAPLE) IMPLANT
STRIP CLOSURE SKIN 1/2X4 (GAUZE/BANDAGES/DRESSINGS) IMPLANT
SUT ETHILON 3 0 PS 1 (SUTURE) IMPLANT
SUT PROLENE 6 0 BV (SUTURE) ×6 IMPLANT
SUT PROLENE 6 0 C 1 24 (SUTURE) ×3 IMPLANT
SUT PROLENE 6 0 CC (SUTURE) IMPLANT
SUT SILK 0 TIES 10X30 (SUTURE) IMPLANT
SUT SILK 3 0 (SUTURE) ×2
SUT SILK 3-0 18XBRD TIE 12 (SUTURE) ×1 IMPLANT
SUT VIC AB 3-0 SH 27 (SUTURE) ×6
SUT VIC AB 3-0 SH 27X BRD (SUTURE) ×3 IMPLANT
SUT VICRYL 4-0 PS2 18IN ABS (SUTURE) ×6 IMPLANT
UNDERPAD 30X30 INCONTINENT (UNDERPADS AND DIAPERS) ×3 IMPLANT
WATER STERILE IRR 1000ML POUR (IV SOLUTION) IMPLANT

## 2016-06-16 NOTE — Progress Notes (Signed)
Opened in error

## 2016-06-16 NOTE — Op Note (Signed)
    NAME: Jamilette Colyar    MRN: [id number] DOB: [birth year]    DATE OF OPERATION: 06/16/2016  PREOP DIAGNOSIS: Poorly maturing left brachiocephalic AV fistula  POSTOP DIAGNOSIS: Same  PROCEDURE:  Ligation of multiple competing branches of left brachiocephalic AV fistula Superficialization of left brachiocephalic AV fistula Revision of left brachiocephalic AV fistula with resection of redundant segment  SURGEON: Di Kindle. Edilia Bo, MD, FACS  ASSIST: Emilio Aspen RNFA  ANESTHESIA: local with sedation   EBL: minimal  INDICATIONS: Brandy Dudley is a 65 y.o. female ho had a left brachiocephalic fistula placed on 04/03/2016. She was seen in follow up on 06/13/2016 by Dr. Arbie Cookey. At that time, the vein was noted to be small, tortuous, and with multiple competing branches. She was set up for ligation of the competing branches.  FINDINGS: There were multiple competing branches which were ligated. The vein was very tortuous after mobilization and this required resection of an approximately 5 cm segment. The vein was sewn back end-to-end.  TECHNIQUE: The patient was taken to the operating room and sedated by anesthesia. The left upper extremity was prepped and draped in usual sterile fashion. Of note because of her pacemaker, we were asked to use the bipolar instead of electrocautery. After the skin was anesthetized with 1% lidocaine,an incision was made over the proximal fistula and here the fistula was dissected free. There was a large competing branch which was identified and ligated and divided between 3-0 silk ties. Next, an incision was made further centrally after the skin was anesthetized and here the vein was mobilized and again multiple competing branches were ligated and divided between 3-0 silk ties. The vein was redundant and tortuous.I made one additional incision further proximally after the skin was anesthetized and the vein was mobilized up to the proximity of the shoulder. After the vein  was fully mobilized and all competing branches ligated it was markedly redundant.   The patient was heparinized. I resected a proximally 5 cm segment of the vein which was the smallest segment. The vein was spatulated each end and sewn into in using 2 6-0 Prolene sutures. Clamps were then released and it was an excellent thrill in the fistula.Hemostasis was obtained and the 3 incisions.In the more central incisions a 3-0 Vicryl suture was placed deep to the vein to elevate it. Each of the 3 skin incision was closed with a 4-0 Vicryl.Liquid band was applied. The patient tolerated the procedure well and was transferred to the recovery room in stable condition. All needle and sponge counts were correct.  Waverly Ferrari, MD, FACS [facility]  DATE OF DICTATION:   06/16/2016

## 2016-06-16 NOTE — Transfer of Care (Signed)
Immediate Anesthesia Transfer of Care Note  Patient: Brandy Dudley  Procedure(s) Performed: Procedure(s): LIGATION OF COMPETING BRANCHES OF left brachiocephalic ARTERIOVENOUS FISTULA (Left) SUPERFICIALIZATION OF LEFT ARM BRACHIOCEPHALIC ARTERIOVENOUS FISTULA (Left) REVISION OF LEFT BRACHIOCEPHALIC ARTERIOVENOUS FISTULA WITH RESECTION OF REDUNDANT SECTION (Left)  Patient Location: PACU  Anesthesia Type:MAC  Level of Consciousness: awake, alert , oriented and patient cooperative  Airway & Oxygen Therapy: Patient Spontanous Breathing and Patient connected to nasal cannula oxygen  Post-op Assessment: Report given to RN, Post -op Vital signs reviewed and stable and Patient moving all extremities  Post vital signs: Reviewed and stable  Last Vitals:  Filed Vitals:   06/16/16 0925 06/16/16 1330  BP: 111/59 105/73  Pulse:  69  Temp:  36.7 C  Resp:  21    Last Pain:  Filed Vitals:   06/16/16 1339  PainSc: 7       Patients Stated Pain Goal: 5 (06/16/16 0946)  Complications: No apparent anesthesia complications

## 2016-06-16 NOTE — Interval H&P Note (Signed)
History and Physical Interval Note:  06/16/2016 10:30 AM  Brandy Dudley  has presented today for surgery, with the diagnosis of End Stage Renal Disease N18.6  The various methods of treatment have been discussed with the patient and family. After consideration of risks, benefits and other options for treatment, the patient has consented to  Procedure(s): LIGATION OF COMPETING BRANCHES OF ARTERIOVENOUS FISTULA (Left) as a surgical intervention .  The patient's history has been reviewed, patient examined, no change in status, stable for surgery.  I have reviewed the patient's chart and labs.  Questions were answered to the patient's satisfaction.     Brandy Dudley

## 2016-06-16 NOTE — Anesthesia Postprocedure Evaluation (Signed)
Anesthesia Post Note  Patient: Brandy Dudley  Procedure(s) Performed: Procedure(s) (LRB): LIGATION OF COMPETING BRANCHES OF left brachiocephalic ARTERIOVENOUS FISTULA (Left) SUPERFICIALIZATION OF LEFT ARM BRACHIOCEPHALIC ARTERIOVENOUS FISTULA (Left) REVISION OF LEFT BRACHIOCEPHALIC ARTERIOVENOUS FISTULA WITH RESECTION OF REDUNDANT SECTION (Left)  Patient location during evaluation: PACU Anesthesia Type: MAC Level of consciousness: awake and alert, oriented and patient cooperative Pain management: pain level controlled Vital Signs Assessment: post-procedure vital signs reviewed and stable Respiratory status: spontaneous breathing, nonlabored ventilation and respiratory function stable Cardiovascular status: blood pressure returned to baseline and stable Postop Assessment: no signs of nausea or vomiting Anesthetic complications: no Comments: AICD function interrogated and confirmed by [employer] rep    Last Vitals:  Filed Vitals:   06/16/16 1415 06/16/16 1430  BP: 109/58   Pulse: 73 70  Temp:    Resp: 16 22    Last Pain:  Filed Vitals:   06/16/16 1442  PainSc: 7                  Maurizio Geno,E. Odin Mariani

## 2016-06-16 NOTE — H&P (View-Only) (Signed)
Patient name: Brandy Dudley MRN: [id number] DOB: Jan 05, 1951 Sex: female  REASON FOR VISIT: Follow-up left upper arm brachiocephalic AV fistula creation on 04/03/2016  HPI: Brandy Dudley is a 65 y.o. female today for follow-up of her creation of left upper arm AV fistula. She also had placement of a left femoral catheter same setting. She reports that the catheter is working well.  Current Outpatient Prescriptions  Medication Sig Dispense Refill  . acetaminophen (TYLENOL) 325 MG tablet Take 325 mg by mouth every 6 (six) hours as needed for mild pain.     Marland Kitchen albuterol (PROAIR HFA) 108 (90 Base) MCG/ACT inhaler Inhale 2 puffs into the lungs 4 (four) times daily as needed for wheezing or shortness of breath.    Marland Kitchen albuterol (PROVENTIL) (2.5 MG/3ML) 0.083% nebulizer solution Take 3 mLs (2.5 mg total) by nebulization every 6 (six) hours as needed for wheezing or shortness of breath. 75 mL 3  . atorvastatin (LIPITOR) 10 MG tablet Take 10 mg by mouth at bedtime.     . budesonide (PULMICORT) 0.5 MG/2ML nebulizer solution Take 2 mLs (0.5 mg total) by nebulization 2 (two) times daily. 250 mL 3  . carvedilol (COREG) 3.125 MG tablet Take 3.125 mg by mouth 2 (two) times daily with a meal. Reported on 05/31/2016    . famotidine (PEPCID) 20 MG tablet Take 20 mg by mouth daily.    . hydrOXYzine (ATARAX/VISTARIL) 50 MG tablet Take 50 mg by mouth every 6 (six) hours as needed for itching.    . insulin glargine (LANTUS) 100 unit/mL SOPN Inject 0.1 mLs (10 Units total) into the skin at bedtime. 15 mL 0  . insulin lispro (HUMALOG) 100 UNIT/ML injection Inject 5 Units into the skin 3 (three) times daily as needed for high blood sugar (CBG >180).     . Inulin (FIBER CHOICE PO) Take 2 tablets by mouth daily as needed (constipation).    . LORazepam (ATIVAN) 0.5 MG tablet Take 0.5 mg by mouth 2 (two) times daily as needed for anxiety.    . Multiple Vitamins-Minerals (CERTAVITE/ANTIOXIDANTS) TABS  Take 1 tablet by mouth at bedtime.    . OXYGEN Inhale 2 L into the lungs at bedtime.    . saccharomyces boulardii (FLORASTOR) 250 MG capsule Take 250 mg by mouth 2 (two) times daily. For recurrent C. Diff    . vancomycin (VANCOCIN) 125 MG capsule Take 125 mg by mouth every 6 (six) hours. Stop date 06/15/16     No current facility-administered medications for this visit.      PHYSICAL EXAM: Filed Vitals:   06/13/16 0951  BP: 105/66  Pulse: 76  Temp: 97.3 F (36.3 C)  TempSrc: Oral  Resp: 28  Height:  (1.626 m)  Weight: 206 lb (93.441 kg)  SpO2: 95%    GENERAL: The patient is a well-nourished female, in no acute distress. The vital signs are documented above. Her left antecubital incision is well-healed. She does have a good thrill through her vein. She has multiple large vein branches and tortuosity of her cephalic vein.  Duplex of her fistula today shows good size maturation of her fistula. There is some tortuosity in their multiple very large competing branches.  MEDICAL ISSUES: 2 month status post left arm AV fistula creation. Feel that that she will require ligation of several large competing branches and potentially straightening of her tortuosity. We would do mobilizes to the surface at the same setting. I recommend this as an outpatient at the hospital  at her earliest convenience  Larina Earthly, MD [facility] Tel [phone] Pager [phone]

## 2016-06-16 NOTE — Anesthesia Preprocedure Evaluation (Signed)
Anesthesia Evaluation  Patient identified by MRN, date of birth, ID band Patient awake    Reviewed: Allergy & Precautions, NPO status , Patient's Chart, lab work & pertinent test results  History of Anesthesia Complications Negative for: history of anesthetic complications  Airway Mallampati: II       Dental  (+) Edentulous Upper, Missing, Partial Lower, Poor Dentition, Dental Advisory Given, Chipped   Pulmonary sleep apnea and Continuous Positive Airway Pressure Ventilation , COPD,  COPD inhaler, former smoker,    breath sounds clear to auscultation       Cardiovascular hypertension, Pt. on medications and Pt. on home beta blockers (-) angina+ CAD, + Past MI and + Cardiac Stents  + Cardiac Defibrillator (Biotronic)  Rhythm:Regular Rate:Normal  4/17 ECHO: EF 35-40%, mild MR, severe TR   Neuro/Psych negative neurological ROS     GI/Hepatic negative GI ROS, Neg liver ROS,   Endo/Other  diabetes (glu 87), Insulin DependentMorbid obesity  Renal/GU Dialysis and ESRFRenal disease (K+ 4.5)     Musculoskeletal   Abdominal (+) + obese,   Peds  Hematology Hb 11.9   Anesthesia Other Findings   Reproductive/Obstetrics                             Anesthesia Physical Anesthesia Plan  ASA: IV  Anesthesia Plan: MAC   Post-op Pain Management:    Induction: Intravenous  Airway Management Planned: Natural Airway and Simple Face Mask  Additional Equipment:   Intra-op Plan:   Post-operative Plan:   Informed Consent: I have reviewed the patients History and Physical, chart, labs and discussed the procedure including the risks, benefits and alternatives for the proposed anesthesia with the patient or authorized representative who has indicated his/her understanding and acceptance.   Dental advisory given  Plan Discussed with: CRNA and Surgeon  Anesthesia Plan Comments: (Plan routine monitors,  MAC Biotronic AICD: magnet disables detection)        Anesthesia Quick Evaluation

## 2016-06-18 ENCOUNTER — Encounter (HOSPITAL_COMMUNITY): Payer: Self-pay | Admitting: Vascular Surgery

## 2016-06-19 ENCOUNTER — Telehealth: Payer: Self-pay | Admitting: Vascular Surgery

## 2016-06-19 ENCOUNTER — Other Ambulatory Visit: Payer: Self-pay

## 2016-06-19 MED ORDER — OXYCODONE-ACETAMINOPHEN 5-325 MG PO TABS: 1.0000 | ORAL_TABLET | ORAL | 0 refills | Status: DC | PRN

## 2016-06-19 NOTE — Telephone Encounter (Signed)
-----   Message from Phillips Odor, RN sent at 06/16/2016  5:46 PM EDT ----- Regarding: needs 4-6 wk. appt. with  (L) arm access duplex and OV with CSD or TFE  Sched. Around her HD day with either Edilia Bo or Early  ----- Message -----    From: Chuck Hint, MD    Sent: 06/16/2016   1:20 PM      To: Vvs Charge Pool Subject: charge                                         PROCEDURE:  Ligation of multiple competing branches of left brachiocephalic AV fistula Superficialization of left brachiocephalic AV fistula Revision of left brachiocephalic AV fistula with resection of redundant segment  SURGEON: Di Kindle. Edilia Bo, MD, FACS  ASSIST: Emilio Aspen RNFA  She will need a follow up visit in 4-6 weeks with a duplex of her fistula at that time.She can follow up with myself or Dr. Arbie Cookey depending upon her dialysis day.

## 2016-06-19 NOTE — Telephone Encounter (Signed)
Sched appt 8/30 lab at 2:00 and MD at 3:00. Spoke to nurse at [facility] to inform them of appt.

## 2016-06-19 NOTE — Telephone Encounter (Signed)
Rx faxed to [facility] @ [phone], phone number [phone]  

## 2016-06-21 ENCOUNTER — Encounter: Payer: Self-pay | Admitting: Family

## 2016-06-21 ENCOUNTER — Non-Acute Institutional Stay (SKILLED_NURSING_FACILITY): Payer: Medicare PPO | Admitting: Family

## 2016-06-21 DIAGNOSIS — N184 Chronic kidney disease, stage 4 (severe): Secondary | ICD-10-CM | POA: Diagnosis not present

## 2016-06-21 DIAGNOSIS — F329 Major depressive disorder, single episode, unspecified: Secondary | ICD-10-CM

## 2016-06-21 DIAGNOSIS — I10 Essential (primary) hypertension: Secondary | ICD-10-CM | POA: Diagnosis not present

## 2016-06-21 DIAGNOSIS — E119 Type 2 diabetes mellitus without complications: Secondary | ICD-10-CM | POA: Diagnosis not present

## 2016-06-21 DIAGNOSIS — E669 Obesity, unspecified: Secondary | ICD-10-CM

## 2016-06-21 DIAGNOSIS — J449 Chronic obstructive pulmonary disease, unspecified: Secondary | ICD-10-CM

## 2016-06-21 DIAGNOSIS — R634 Abnormal weight loss: Secondary | ICD-10-CM

## 2016-06-21 DIAGNOSIS — E1169 Type 2 diabetes mellitus with other specified complication: Secondary | ICD-10-CM

## 2016-06-21 DIAGNOSIS — I5042 Chronic combined systolic (congestive) and diastolic (congestive) heart failure: Secondary | ICD-10-CM | POA: Diagnosis not present

## 2016-06-21 DIAGNOSIS — F32A Depression, unspecified: Secondary | ICD-10-CM

## 2016-06-21 NOTE — Progress Notes (Addendum)
Location:    [facility]  Nursing Home Room Number: 603-P Place of Service:  SNF (31) Provider: Angelyn Osterberg FNP-C   [name], MD  Patient Care Team: [name], MD as PCP - General (Internal Medicine)  Extended Emergency Contact Information Primary Emergency Contact: Haith,[name] Address: [address]          [place], [place] [address] [place] Home Phone: [phone] Relation: Sister Secondary Emergency Contact: Marcello Moores, [place] Home Phone: [phone] Relation: Sister  Code Status:  Full Code  Goals of care: Advanced Directive information Advanced Directives 06/13/2016  Does patient have an advance directive? Yes  Type of Advance Directive Living will  Does patient want to make changes to advanced directive? -  Copy of advanced directive(s) in chart? -     Chief Complaint  Patient presents with  . Medical Management of Chronic Issues    HPI:  Pt is a 65 y.o. female seen today at [facility] for medical management of chronic diseases. She is seen in her room today. She had a left AV fistula place 06/20/2016 for her ESRD dialysis. She is currently on Vancomycin 125 mg oral tapered dose for  C-diff infections.She states stool have been loose but no diarrhea. She states feeling depressed and feels like giving up at times. She denies any suicide ideation. She has church friends from [place] that encourage her.She also request Nebulizer to be changed to inhaler instead of solution and as needed verse scheduled. Facility Nurse reported patient's B/P runs 90's/60's at times and O2 sats in the upper 80's to 90's.  She denies any fever, chills or cough.    Past Medical History:  Diagnosis Date  . AICD (automatic cardioverter/defibrillator) present   . Anemia   . C. difficile colitis   . CHF (congestive heart failure) (HCC)    systolic  . COPD (chronic obstructive  pulmonary disease) (HCC)   . Coronary artery disease   . Diabetes mellitus (HCC)   . DVT (deep venous thrombosis) (HCC)    on coumadin  . ESRD (end stage renal disease) (HCC)    tues/thurs/sat dialysis  . Hypertension   . Obstructive sleep apnea    no cpap  . Renal disorder    Past Surgical History:  Procedure Laterality Date  . AV FISTULA PLACEMENT Left 04/03/2016   Procedure: ARTERIOVENOUS (AV) FISTULA CREATION;  Surgeon: Larina Earthly, MD;  Location: [facility] OR;  Service: Vascular;  Laterality: Left;  . FISTULA SUPERFICIALIZATION Left 06/16/2016   Procedure: SUPERFICIALIZATION OF LEFT ARM BRACHIOCEPHALIC ARTERIOVENOUS FISTULA;  Surgeon: Chuck Hint, MD;  Location: [facility] OR;  Service: Vascular;  Laterality: Left;  . INSERTION OF DIALYSIS CATHETER Left 04/03/2016   Procedure: INSERTION OF DIALYSIS CATHETER;  Surgeon: Larina Earthly, MD;  Location: [facility] OR;  Service: Vascular;  Laterality: Left;  . LIGATION OF COMPETING BRANCHES OF ARTERIOVENOUS FISTULA Left 06/16/2016   Procedure: LIGATION OF COMPETING BRANCHES OF left brachiocephalic ARTERIOVENOUS FISTULA;  Surgeon: Chuck Hint, MD;  Location: [facility] OR;  Service: Vascular;  Laterality: Left;  . REVISION OF ARTERIOVENOUS GORETEX GRAFT Left 06/16/2016   Procedure: REVISION OF LEFT BRACHIOCEPHALIC ARTERIOVENOUS FISTULA WITH RESECTION OF REDUNDANT SECTION;  Surgeon: Chuck Hint, MD;  Location: [facility] OR;  Service: Vascular;  Laterality: Left;    Allergies  Allergen Reactions  . Entresto [Sacubitril-Valsartan] Other (See Comments)  Not listed on [facility] 05/06/16 - unknown reaction  . Sulfa Antibiotics Itching      Medication List       Accurate as of 06/21/16 12:16 PM. Always use your most recent med list.          acetaminophen 325 MG tablet Commonly known as:  TYLENOL Take 325 mg by mouth every 6 (six) hours as needed for mild pain or fever (For fever >99.5).   PROAIR HFA 108 (90 Base) MCG/ACT inhaler Generic drug:   albuterol Inhale 2 puffs into the lungs every 6 (six) hours as needed for wheezing or shortness of breath.   albuterol (2.5 MG/3ML) 0.083% nebulizer solution Commonly known as:  PROVENTIL Take 3 mLs (2.5 mg total) by nebulization every 6 (six) hours as needed for wheezing or shortness of breath.   atorvastatin 10 MG tablet Commonly known as:  LIPITOR Take 10 mg by mouth at bedtime.   budesonide 0.5 MG/2ML nebulizer solution Commonly known as:  PULMICORT Take 2 mLs (0.5 mg total) by nebulization 2 (two) times daily.   carvedilol 3.125 MG tablet Commonly known as:  COREG Take 3.125 mg by mouth 2 (two) times daily with a meal. Reported on 05/31/2016   CERTAVITE/ANTIOXIDANTS Tabs Take 1 tablet by mouth at bedtime.   famotidine 20 MG tablet Commonly known as:  PEPCID Take 20 mg by mouth daily.   FIBER CHOICE PO Take 2 tablets by mouth daily as needed (constipation).   hydrOXYzine 50 MG tablet Commonly known as:  ATARAX/VISTARIL Take 50 mg by mouth every 6 (six) hours as needed for itching.   insulin glargine 100 unit/mL Sopn Commonly known as:  LANTUS Inject 0.1 mLs (10 Units total) into the skin at bedtime.   insulin lispro 100 UNIT/ML injection Commonly known as:  HUMALOG Inject 5 Units into the skin 3 (three) times daily as needed for high blood sugar (CBG >180).   LORazepam 0.5 MG tablet Commonly known as:  ATIVAN Take 0.5 mg by mouth 2 (two) times daily as needed for anxiety.   oxyCODONE-acetaminophen 5-325 MG tablet Commonly known as:  ROXICET Take 1-2 tablets by mouth every 4 (four) hours as needed.   OXYGEN Inhale 2 L into the lungs at bedtime.   saccharomyces boulardii 250 MG capsule Commonly known as:  FLORASTOR Take 250 mg by mouth 2 (two) times daily. For recurrent C. Diff.  Take until 2 weeks after completion of antibiotics   vancomycin 50 mg/mL oral solution Commonly known as:  VANCOCIN Take 125 mg by mouth 2 (two) times daily. BID x7 days, then 125 mg  QD x7 days, then 125 mg QOD x7 days, then 125 mg q3 days x14 days.       Review of Systems  Constitutional: Negative for activity change, appetite change, chills, fatigue and fever.  HENT: Negative for congestion, rhinorrhea, sinus pressure, sneezing and sore throat.   Eyes: Negative.   Respiratory: Negative for cough, chest tightness, shortness of breath and wheezing.   Cardiovascular: Negative for chest pain and palpitations.  Gastrointestinal: Negative for abdominal distention, constipation and vomiting.       Abdominal Hernia. Loose stool   Endocrine: Negative.   Genitourinary: Negative for dysuria, frequency and urgency.  Musculoskeletal: Positive for gait problem.       Right leg pain   Skin: Negative for color change, pallor and rash.       Left arm AV fistula. Left thigh dialysis Central line.   Neurological: Negative for dizziness,  seizures, syncope, light-headedness and headaches.  Psychiatric/Behavioral: Negative for agitation, confusion, hallucinations, sleep disturbance and suicidal ideas. The patient is not nervous/anxious.        Reports feeling depressed    Immunization History  Administered Date(s) Administered  . PPD Test 03/01/2016, 04/10/2016   Pertinent  Health Maintenance Due  Topic Date Due  . FOOT EXAM  11/27/2016 (Originally 07/23/1960)  . MAMMOGRAM  11/27/2016 (Originally 07/23/2000)  . PAP SMEAR  11/27/2016 (Originally 07/24/1971)  . OPHTHALMOLOGY EXAM  11/27/2016 (Originally 07/23/1960)  . URINE MICROALBUMIN  11/27/2016 (Originally 07/23/1960)  . DEXA SCAN  11/27/2016 (Originally 07/24/2015)  . COLONOSCOPY  11/27/2016 (Originally 07/23/2000)  . PNA vac Low Risk Adult (1 of 2 - PCV13) 11/27/2016 (Originally 07/24/2015)  . INFLUENZA VACCINE  06/27/2016  . HEMOGLOBIN A1C  09/16/2016   No flowsheet data found. Functional Status Survey:    Vitals:   06/21/16 0952  BP: 124/82  Pulse: 85  Resp: 20  Temp: 98.2 F (36.8 C)  TempSrc: Oral  Weight: 206 lb  (93.4 kg)  Height: 5\' 4"  (1.626 m)   Body mass index is 35.36 kg/m. Physical Exam  Constitutional: She is oriented to person, place, and time. She appears well-developed and well-nourished. No distress.  HENT:  Head: Normocephalic.  Mouth/Throat: Oropharynx is clear and moist. No oropharyngeal exudate.  Eyes: Conjunctivae and EOM are normal. Pupils are equal, round, and reactive to light. Right eye exhibits no discharge. Left eye exhibits no discharge. No scleral icterus.  Neck: Normal range of motion. No JVD present. No thyromegaly present.  Cardiovascular: Normal rate, regular rhythm, normal heart sounds and intact distal pulses.  Exam reveals no gallop and no friction rub.   No murmur heard. Pulmonary/Chest: Effort normal and breath sounds normal. No respiratory distress. She has no wheezes. She has no rales.  Abdominal: Soft. Bowel sounds are normal. She exhibits no distension. There is no tenderness. There is no rebound and no guarding.  Genitourinary:  Genitourinary Comments: On Hemo dialysis   Musculoskeletal: Normal range of motion. She exhibits no tenderness.  Left ankle trace edema   Lymphadenopathy:    She has no cervical adenopathy.  Neurological: She is oriented to person, place, and time.  Skin: Skin is warm and dry. No rash noted. No erythema. No pallor.  Left AV fistula incision site dry clean and intact. Surrounding skin without any signs of infections. Left thigh Central line drsg dry, clean and intact.   Psychiatric:  Depressed     Labs reviewed:  Recent Labs  03/29/16 0410  04/03/16 1423  05/08/16 0436  05/11/16 1231 05/11/16 1643  05/12/16 0805 05/16/16 05/23/16 06/01/16 06/16/16 0944  NA 137  < >  --   < > 137  < > 137 135  < > 135 141 143 143 139  K 3.5  < >  --   < > 3.5  < > 4.2 2.8*  --  3.6 3.9 4.3 3.6 4.5  CL 102  < >  --   < > 103  < > 101 99*  --  101  --   --   --   --   CO2 27  < >  --   < > 25  < > 27 29  --  27  --   --   --   --   GLUCOSE  238*  < >  --   < > 76  < > 154* 108*  --  109*  --   --   --  93  BUN 26*  < >  --   < > 28*  < > 32* 14  < > 20 37* 36* 52*  --   CREATININE 2.66*  < >  --   < > 6.52*  < > 6.71* 3.91*  < > 4.89* 5.9* 4.6* 5.6*  --   CALCIUM 8.0*  < >  --   < > 8.0*  < > 8.3* 8.0*  --  8.3*  --   --   --   --   MG 2.2  --  2.1  --  1.9  --   --   --   --   --   --   --   --   --   PHOS 2.7  < >  --   < >  --   < > 3.0 1.7*  --  2.6  --   --   --   --   < > = values in this interval not displayed.  Recent Labs  03/04/16 0000  03/21/16 0807  05/06/16 1240  05/11/16 1231 05/11/16 1643 05/12/16 0805 05/16/16 05/23/16 06/01/16  AST 43*  < > 22  < > 18  --   --   --   --  18 25 15   ALT 15  < > 11*  < > 11*  --   --   --   --  12 13 8   ALKPHOS 207*  < > 110  < > 180*  --   --   --   --  196* 222* 234*  BILITOT 2.3*  --  1.5*  --  1.5*  --   --   --   --   --   --   --   PROT 6.9  --  5.7*  --  7.3  --   --   --   --   --   --   --   ALBUMIN 2.9*  --  2.1*  < > 2.4*  < > 2.2* 2.2* 2.3*  --   --   --   < > = values in this interval not displayed.  Recent Labs  03/14/16 1457  03/20/16 1227  05/06/16 1240  05/09/16 0724  05/11/16 1231 05/11/16 1643 05/16/16 05/23/16 06/01/16 06/16/16 0944  WBC 13.0*  < > 7.5  < > 5.3  < > 6.6  < > 10.7* 11.9* 11.0 8.1 5.4  --   NEUTROABS 11.0*  --  5.7  --  3.3  --   --   --   --   --   --   --   --   --   HGB 9.3*  < > 9.0*  < > 9.3*  < > 9.4*  --  9.4* 9.4* 9.9* 9.6* 9.7* 11.9*  HCT 30.2*  < > 28.4*  < > 29.8*  < > 30.9*  --  30.7* 30.0* 31* 30* 31* 35.0*  MCV 89.3  --  85.5  < > 85.4  < > 87.8  --  87.2 85.2  --   --   --   --   PLT 136*  < > 165  < > 268  < > 219  --  209 198 219 229 266  --   < > = values in this interval not displayed. Lab Results  Component Value Date   TSH 1.993 03/22/2016   Lab Results  Component Value Date  HGBA1C 6.4 03/17/2016   Lab Results  Component Value Date   CHOL 49 03/17/2016   HDL 14 (A) 03/17/2016   LDLCALC 21  03/17/2016   TRIG 68 03/17/2016    Significant Diagnostic Results in last 30 days:  No results found.  Assessment/Plan 1. 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 Will start on sertraline 25 mg Tablet daily. Psychiatry and Psychotherapy to evaluate. Monitor for mood changes.   2. Chronic obstructive pulmonary disease, unspecified COPD type (HCC) Stable. Requests Nebulizer solution to be change inhalers and given as needed. Change Budesonide Flex haler  90 mcg/ Act inhaler 2 puffs twice daily. Discontinue  Albuterol 0.083 % Nebulizer Solution per patient's request. Continue with Albuterol inhaler   3. Chronic kidney disease (CKD), stage IV (severe) (HCC) Has new left arm AV fistula. Left thigh Central line. Continue with Hemodialysis. Monitor BMP. Renal diet.    4. Essential hypertension, malignant B/p stable this visit. Facility Nurse reported B/p in the 90's/60's. Continue on coreg 3.125 mg tablet with current holding parameters. Continue to monitor B/P Bid   5. Chronic combined systolic and diastolic CHF (congestive heart failure) (HCC) Stable. No shortness of breath, wheezing, rales or cough. Continue Coreg 3.125 mg Tablet. Monitor weight.   6. Diabetes mellitus type 2 in obese (HCC) CBG's stable. Continue on Lantus and Humalog per SSI. Monitor Hgb A1C   7. Weight loss  Has had 11 pounds loss over one month possible due to recent diarrhea/C-diff. Will consult RD to evaluate.    Family/ staff Communication: Reviewed plan with patient and facility Nurse supervisor.  Labs/tests ordered:  None

## 2016-07-05 ENCOUNTER — Non-Acute Institutional Stay (SKILLED_NURSING_FACILITY): Payer: Medicare PPO | Admitting: Family

## 2016-07-05 DIAGNOSIS — R634 Abnormal weight loss: Secondary | ICD-10-CM | POA: Diagnosis not present

## 2016-07-05 DIAGNOSIS — F411 Generalized anxiety disorder: Secondary | ICD-10-CM | POA: Diagnosis not present

## 2016-07-05 DIAGNOSIS — R630 Anorexia: Secondary | ICD-10-CM | POA: Diagnosis not present

## 2016-07-05 DIAGNOSIS — K429 Umbilical hernia without obstruction or gangrene: Secondary | ICD-10-CM | POA: Insufficient documentation

## 2016-07-05 DIAGNOSIS — F32A Depression, unspecified: Secondary | ICD-10-CM

## 2016-07-05 DIAGNOSIS — F329 Major depressive disorder, single episode, unspecified: Secondary | ICD-10-CM | POA: Diagnosis not present

## 2016-07-05 MED ORDER — MIRTAZAPINE 15 MG PO TABS: 15.0000 mg | ORAL_TABLET | Freq: Every day | ORAL | Status: DC

## 2016-07-05 NOTE — Progress Notes (Signed)
Location:   [name]  Nursing Home Room Number: [phone] Place of Service:  SNF (31) Provider:Larnell Granlund FNP-C   [name], MD  Patient Care Team: [name], MD as PCP - General (Internal Medicine)  Extended Emergency Contact Information Primary Emergency Contact: Haith,[name] Address: [address]          [place], [place] [address] Darden [place] of [place] Home Phone: [phone] Relation: Sister Secondary Emergency Contact: Marcello Moores, [place] Home Phone: [phone] Relation: Sister  Code Status:  Full Code  Goals of care: Advanced Directive information Advanced Directives 06/13/2016  Does patient have an advance directive? Yes  Type of Advance Directive Living will  Does patient want to make changes to advanced directive? -  Copy of advanced directive(s) in chart? -     Chief Complaint  Patient presents with  . Acute Visit    HPI:  Pt is a 65 y.o. female seen today at [facility] for an acute visit for evaluation of loss of weight.She has a significant medical history of CHF, ESRD, COPD, Type 2 DM among other conditions. She is seen in her room today.she complains of umbilical hernia pain states unable to eat due to pain. She has lost 14 pounds over one month. She states has had decreased intake due to umbilical hernia. She was referred to general surgery 05/31/2016 per no appointment made had a dialysis vascular appointment per Nurse supervisor. She is tearful during visit states depression. She denies any fever, chills, diarrhea, Nausea or vomiting.    Past Medical History:  Diagnosis Date  . AICD (automatic cardioverter/defibrillator) present   . Anemia   . C. difficile colitis   . CHF (congestive heart failure) (HCC)    systolic  . COPD (chronic obstructive pulmonary disease) (HCC)   . Coronary artery disease   . Diabetes mellitus (HCC)   . DVT (deep venous thrombosis)  (HCC)    on coumadin  . ESRD (end stage renal disease) (HCC)    tues/thurs/sat dialysis  . Hypertension   . Obstructive sleep apnea    no cpap  . Renal disorder    Past Surgical History:  Procedure Laterality Date  . AV FISTULA PLACEMENT Left 04/03/2016   Procedure: ARTERIOVENOUS (AV) FISTULA CREATION;  Surgeon: Larina Earthly, MD;  Location: [facility] OR;  Service: Vascular;  Laterality: Left;  . FISTULA SUPERFICIALIZATION Left 06/16/2016   Procedure: SUPERFICIALIZATION OF LEFT ARM BRACHIOCEPHALIC ARTERIOVENOUS FISTULA;  Surgeon: Chuck Hint, MD;  Location: [facility] OR;  Service: Vascular;  Laterality: Left;  . INSERTION OF DIALYSIS CATHETER Left 04/03/2016   Procedure: INSERTION OF DIALYSIS CATHETER;  Surgeon: Larina Earthly, MD;  Location: [facility] OR;  Service: Vascular;  Laterality: Left;  . LIGATION OF COMPETING BRANCHES OF ARTERIOVENOUS FISTULA Left 06/16/2016   Procedure: LIGATION OF COMPETING BRANCHES OF left brachiocephalic ARTERIOVENOUS FISTULA;  Surgeon: Chuck Hint, MD;  Location: [facility] OR;  Service: Vascular;  Laterality: Left;  . REVISION OF ARTERIOVENOUS GORETEX GRAFT Left 06/16/2016   Procedure: REVISION OF LEFT BRACHIOCEPHALIC ARTERIOVENOUS FISTULA WITH RESECTION OF REDUNDANT SECTION;  Surgeon: Chuck Hint, MD;  Location: [facility] OR;  Service: Vascular;  Laterality: Left;    Allergies  Allergen Reactions  . Entresto [Sacubitril-Valsartan] Other (See Comments)    Not listed on [facility] 05/06/16 - unknown reaction  . Sulfa Antibiotics Itching      Medication List  Accurate as of 07/05/16  3:36 PM. Always use your most recent med list.          acetaminophen 325 MG tablet Commonly known as:  TYLENOL Take 325 mg by mouth every 6 (six) hours as needed for mild pain or fever (For fever >99.5).   atorvastatin 10 MG tablet Commonly known as:  LIPITOR Take 10 mg by mouth at bedtime.   budesonide 180 MCG/ACT inhaler Commonly known as:  PULMICORT Inhale 2 puffs into the  lungs 2 (two) times daily.   carvedilol 3.125 MG tablet Commonly known as:  COREG Take 3.125 mg by mouth 2 (two) times daily with a meal. Reported on 05/31/2016   CERTAVITE/ANTIOXIDANTS Tabs Take 1 tablet by mouth at bedtime.   famotidine 20 MG tablet Commonly known as:  PEPCID Take 20 mg by mouth daily.   FIBER CHOICE PO Take 2 tablets by mouth daily as needed (constipation).   hydrOXYzine 50 MG tablet Commonly known as:  ATARAX/VISTARIL Take 50 mg by mouth every 6 (six) hours as needed for itching.   insulin glargine 100 unit/mL Sopn Commonly known as:  LANTUS Inject 0.1 mLs (10 Units total) into the skin at bedtime.   insulin lispro 100 UNIT/ML injection Commonly known as:  HUMALOG Inject 5 Units into the skin 3 (three) times daily as needed for high blood sugar (CBG >180).   LORazepam 0.5 MG tablet Commonly known as:  ATIVAN Take 0.5 mg by mouth 2 (two) times daily as needed for anxiety.   mirtazapine 15 MG tablet Commonly known as:  REMERON Take 1 tablet (15 mg total) by mouth at bedtime.   oxyCODONE-acetaminophen 5-325 MG tablet Commonly known as:  ROXICET Take 1-2 tablets by mouth every 4 (four) hours as needed.   OXYGEN Inhale 2 L into the lungs at bedtime.   PROAIR HFA 108 (90 Base) MCG/ACT inhaler Generic drug:  albuterol Inhale 2 puffs into the lungs every 6 (six) hours as needed for wheezing or shortness of breath.   saccharomyces boulardii 250 MG capsule Commonly known as:  FLORASTOR Take 250 mg by mouth 2 (two) times daily. For recurrent C. Diff.  Take until 2 weeks after completion of antibiotics   sertraline 25 MG tablet Commonly known as:  ZOLOFT Take 25 mg by mouth daily.   vancomycin 50 mg/mL oral solution Commonly known as:  VANCOCIN Take 125 mg by mouth 2 (two) times daily. BID x7 days, then 125 mg QD x7 days, then 125 mg QOD x7 days, then 125 mg q3 days x14 days.       Review of Systems  Constitutional: Negative for activity change,  appetite change, chills, fatigue and fever.  HENT: Negative for congestion, rhinorrhea, sinus pressure, sneezing and sore throat.   Eyes: Negative.   Respiratory: Negative for cough, chest tightness, shortness of breath and wheezing.   Cardiovascular: Negative for chest pain and palpitations.  Gastrointestinal: Positive for abdominal pain and nausea. Negative for abdominal distention, constipation, diarrhea and vomiting.       Abdominal Hernia.   Endocrine: Negative.   Genitourinary: Negative for dysuria, frequency and urgency.  Musculoskeletal: Positive for gait problem.  Skin: Negative for color change, pallor and rash.       Left arm AV fistula. Left thigh dialysis Central line.   Neurological: Negative for dizziness, seizures, syncope, light-headedness and headaches.  Psychiatric/Behavioral: Negative for agitation, confusion, hallucinations, sleep disturbance and suicidal ideas. The patient is not nervous/anxious.  Reports feeling depressed    Immunization History  Administered Date(s) Administered  . PPD Test 03/01/2016, 04/10/2016   Pertinent  Health Maintenance Due  Topic Date Due  . INFLUENZA VACCINE  06/27/2016  . FOOT EXAM  11/27/2016 (Originally 07/23/1960)  . MAMMOGRAM  11/27/2016 (Originally 07/23/2000)  . PAP SMEAR  11/27/2016 (Originally 07/24/1971)  . OPHTHALMOLOGY EXAM  11/27/2016 (Originally 07/23/1960)  . URINE MICROALBUMIN  11/27/2016 (Originally 07/23/1960)  . DEXA SCAN  11/27/2016 (Originally 07/24/2015)  . COLONOSCOPY  11/27/2016 (Originally 07/23/2000)  . PNA vac Low Risk Adult (1 of 2 - PCV13) 11/27/2016 (Originally 07/24/2015)  . HEMOGLOBIN A1C  09/16/2016      Vitals:   07/05/16 1457  BP: 103/68  Pulse: 79  Resp: 20  Temp: 97.4 F (36.3 C)  SpO2: 98%  Weight: 202 lb 6.4 oz (91.8 kg)  Height: 5\' 4"  (1.626 m)   Body mass index is 34.74 kg/m. Physical Exam  Constitutional: She is oriented to person, place, and time. She appears well-developed  and well-nourished. No distress.  HENT:  Head: Normocephalic.  Mouth/Throat: Oropharynx is clear and moist. No oropharyngeal exudate.  Eyes: Conjunctivae and EOM are normal. Pupils are equal, round, and reactive to light. Right eye exhibits no discharge. Left eye exhibits no discharge. No scleral icterus.  Neck: Normal range of motion. No JVD present. No thyromegaly present.  Cardiovascular: Normal rate, regular rhythm, normal heart sounds and intact distal pulses.  Exam reveals no gallop and no friction rub.   No murmur heard. Pulmonary/Chest: Effort normal and breath sounds normal. No respiratory distress. She has no wheezes. She has no rales.  Abdominal: Soft. Bowel sounds are normal. She exhibits no distension. There is no rebound and no guarding.  Umbilical Hernia reducible but tender to touch.   Genitourinary:  Genitourinary Comments: On Hemo dialysis   Musculoskeletal: Normal range of motion. She exhibits no tenderness.  Left ankle trace edema   Lymphadenopathy:    She has no cervical adenopathy.  Neurological: She is oriented to person, place, and time.  Skin: Skin is warm and dry. No rash noted. No erythema. No pallor.  Left AV fistula incision site dry clean and intact. Surrounding skin without any signs of infections.   Psychiatric:  Depressed. Tearful during visit.     Labs reviewed:  Recent Labs  03/29/16 0410  04/03/16 1423  05/08/16 0436  05/11/16 1231 05/11/16 1643  05/12/16 0805 05/16/16 05/23/16 06/01/16 06/16/16 0944  NA 137  < >  --   < > 137  < > 137 135  < > 135 141 143 143 139  K 3.5  < >  --   < > 3.5  < > 4.2 2.8*  --  3.6 3.9 4.3 3.6 4.5  CL 102  < >  --   < > 103  < > 101 99*  --  101  --   --   --   --   CO2 27  < >  --   < > 25  < > 27 29  --  27  --   --   --   --   GLUCOSE 238*  < >  --   < > 76  < > 154* 108*  --  109*  --   --   --  93  BUN 26*  < >  --   < > 28*  < > 32* 14  < > 20 37* 36* 52*  --  CREATININE 2.66*  < >  --   < > 6.52*  < >  6.71* 3.91*  < > 4.89* 5.9* 4.6* 5.6*  --   CALCIUM 8.0*  < >  --   < > 8.0*  < > 8.3* 8.0*  --  8.3*  --   --   --   --   MG 2.2  --  2.1  --  1.9  --   --   --   --   --   --   --   --   --   PHOS 2.7  < >  --   < >  --   < > 3.0 1.7*  --  2.6  --   --   --   --   < > = values in this interval not displayed.  Recent Labs  03/04/16 0000  03/21/16 0807  05/06/16 1240  05/11/16 1231 05/11/16 1643 05/12/16 0805 05/16/16 05/23/16 06/01/16  AST 43*  < > 22  < > 18  --   --   --   --  18 25 15   ALT 15  < > 11*  < > 11*  --   --   --   --  12 13 8   ALKPHOS 207*  < > 110  < > 180*  --   --   --   --  196* 222* 234*  BILITOT 2.3*  --  1.5*  --  1.5*  --   --   --   --   --   --   --   PROT 6.9  --  5.7*  --  7.3  --   --   --   --   --   --   --   ALBUMIN 2.9*  --  2.1*  < > 2.4*  < > 2.2* 2.2* 2.3*  --   --   --   < > = values in this interval not displayed.  Recent Labs  03/14/16 1457  03/20/16 1227  05/06/16 1240  05/09/16 0724  05/11/16 1231 05/11/16 1643 05/16/16 05/23/16 06/01/16 06/16/16 0944  WBC 13.0*  < > 7.5  < > 5.3  < > 6.6  < > 10.7* 11.9* 11.0 8.1 5.4  --   NEUTROABS 11.0*  --  5.7  --  3.3  --   --   --   --   --   --   --   --   --   HGB 9.3*  < > 9.0*  < > 9.3*  < > 9.4*  --  9.4* 9.4* 9.9* 9.6* 9.7* 11.9*  HCT 30.2*  < > 28.4*  < > 29.8*  < > 30.9*  --  30.7* 30.0* 31* 30* 31* 35.0*  MCV 89.3  --  85.5  < > 85.4  < > 87.8  --  87.2 85.2  --   --   --   --   PLT 136*  < > 165  < > 268  < > 219  --  209 198 219 229 266  --   < > = values in this interval not displayed. Lab Results  Component Value Date   TSH 1.993 03/22/2016   Lab Results  Component Value Date   HGBA1C 6.4 03/17/2016   Lab Results  Component Value Date   CHOL 49 03/17/2016   HDL 14 (A) 03/17/2016   LDLCALC 21 03/17/2016  TRIG 68 03/17/2016    Assessment/Plan 1. Umbilical hernia without obstruction or gangrene Reducible. Afebrile. Tender to touch. Unable to tolerated oral intake due to  hernia. Will refer to general surgery for evaluation.   2. Loss of weight Has had 14 pounds over one month.Of note she reports not eating due to abdominal hernia pain that's worse with food intake.   3. Poor appetite Wt loss 14 pounds over one month. Add Remeron 15 mg Tablet at bedtime.   4. Depression Continue on sertraline 25 mg tablet daily.Add Remeron 15 mg Tablet at bedtime  5. Generalized anxiety disorder Tearful during visit. Continue on Ativan.     Family/ staff Communication:Reviewed plan of care with patient and facility Nurse supervisor.  Labs/tests ordered:  None

## 2016-07-12 ENCOUNTER — Other Ambulatory Visit: Payer: Self-pay | Admitting: Gastroenterology

## 2016-07-12 DIAGNOSIS — R1013 Epigastric pain: Secondary | ICD-10-CM

## 2016-07-13 ENCOUNTER — Encounter: Payer: Self-pay | Admitting: Family

## 2016-07-13 ENCOUNTER — Non-Acute Institutional Stay (SKILLED_NURSING_FACILITY): Payer: Medicare PPO | Admitting: Family

## 2016-07-13 DIAGNOSIS — L989 Disorder of the skin and subcutaneous tissue, unspecified: Secondary | ICD-10-CM | POA: Diagnosis not present

## 2016-07-13 NOTE — Progress Notes (Signed)
Patient ID: Brandy Dudley, female   DOB: [birth year], 65 y.o.   MRN: [id number]  Location:    [facility]  Nursing Home Room Number: [phone] Place of Service:  SNF (31) Provider:  Ryelle Ruvalcaba FNP-C   [name], MD  Patient Care Team: [name], MD as PCP - General (Internal Medicine)  Extended Emergency Contact Information Primary Emergency Contact: Brandy Dudley Address: [address]          [place], [place] [address] Brandy Dudley of [place] Home Phone: [phone] Relation: Sister Secondary Emergency Contact: Brandy Dudley, [place] Home Phone: [phone] Relation: Sister  Code Status:  Full Code  Goals of care: Advanced Directive information Advanced Directives 07/13/2016  Does patient have an advance directive? No  Type of Advance Directive -  Does patient want to make changes to advanced directive? -  Copy of advanced directive(s) in chart? -  Would patient like information on creating an advanced directive? No - patient declined information     Chief Complaint  Patient presents with  . Acute Visit    Skin Tag    HPI:  Pt is a 64 y.o. female seen today at [facility] for an acute visit for evaluation of right ischium skin tag. She is seen in her room today. She was recently seen by Gastroenterology due to nausea and vomiting, epigastric pain and C-diff follow up. Famotidine initiated and abdominal ultrasound ordered. Facility wound Nurse reports right ischium skin tag irritated.Patient skin tag painful when rubbed by the diaper lining. She denies any fever, chills, nausea or vomiting.    Past Medical History:  Diagnosis Date  . AICD (automatic cardioverter/defibrillator) present   . Anemia   . C. difficile colitis   . CHF (congestive heart failure) (HCC)    systolic  . COPD (chronic obstructive pulmonary disease) (HCC)   . Coronary artery disease   . Diabetes mellitus (HCC)   . DVT  (deep venous thrombosis) (HCC)    on coumadin  . ESRD (end stage renal disease) (HCC)    tues/thurs/sat dialysis  . Hypertension   . Obstructive sleep apnea    no cpap  . Renal disorder    Past Surgical History:  Procedure Laterality Date  . AV FISTULA PLACEMENT Left 04/03/2016   Procedure: ARTERIOVENOUS (AV) FISTULA CREATION;  Surgeon: Larina Earthly, MD;  Location: [facility] OR;  Service: Vascular;  Laterality: Left;  . FISTULA SUPERFICIALIZATION Left 06/16/2016   Procedure: SUPERFICIALIZATION OF LEFT ARM BRACHIOCEPHALIC ARTERIOVENOUS FISTULA;  Surgeon: Chuck Hint, MD;  Location: [facility] OR;  Service: Vascular;  Laterality: Left;  . INSERTION OF DIALYSIS CATHETER Left 04/03/2016   Procedure: INSERTION OF DIALYSIS CATHETER;  Surgeon: Larina Earthly, MD;  Location: [facility] OR;  Service: Vascular;  Laterality: Left;  . LIGATION OF COMPETING BRANCHES OF ARTERIOVENOUS FISTULA Left 06/16/2016   Procedure: LIGATION OF COMPETING BRANCHES OF left brachiocephalic ARTERIOVENOUS FISTULA;  Surgeon: Chuck Hint, MD;  Location: [facility] OR;  Service: Vascular;  Laterality: Left;  . REVISION OF ARTERIOVENOUS GORETEX GRAFT Left 06/16/2016   Procedure: REVISION OF LEFT BRACHIOCEPHALIC ARTERIOVENOUS FISTULA WITH RESECTION OF REDUNDANT SECTION;  Surgeon: Chuck Hint, MD;  Location: [facility] OR;  Service: Vascular;  Laterality: Left;    Allergies  Allergen Reactions  . Entresto [Sacubitril-Valsartan] Other (See Comments)    Not listed on [facility] 05/06/16 - unknown reaction  . Sulfa Antibiotics  Itching      Medication List       Accurate as of 07/13/16  2:38 PM. Always use your most recent med list.          acetaminophen 325 MG tablet Commonly known as:  TYLENOL Take 325 mg by mouth every 6 (six) hours as needed for mild pain or fever (For fever >99.5).   atorvastatin 10 MG tablet Commonly known as:  LIPITOR Take 10 mg by mouth at bedtime.   budesonide 180 MCG/ACT inhaler Commonly known as:   PULMICORT Inhale 2 puffs into the lungs 2 (two) times daily.   carvedilol 3.125 MG tablet Commonly known as:  COREG Take 3.125 mg by mouth 2 (two) times daily with a meal. Reported on 05/31/2016   CERTAVITE/ANTIOXIDANTS Tabs Take 1 tablet by mouth at bedtime.   famotidine 20 MG tablet Commonly known as:  PEPCID Take 20 mg by mouth daily.   FIBER CHOICE PO Take 2 tablets by mouth daily as needed (constipation).   hydrOXYzine 50 MG tablet Commonly known as:  ATARAX/VISTARIL Take 50 mg by mouth every 6 (six) hours as needed for itching.   insulin glargine 100 unit/mL Sopn Commonly known as:  LANTUS Inject 0.1 mLs (10 Units total) into the skin at bedtime.   insulin lispro 100 UNIT/ML injection Commonly known as:  HUMALOG Inject 5 Units into the skin 3 (three) times daily as needed for high blood sugar (CBG >180).   LORazepam 0.5 MG tablet Commonly known as:  ATIVAN Take 0.5 mg by mouth 2 (two) times daily as needed for anxiety.   mirtazapine 15 MG tablet Commonly known as:  REMERON Take 1 tablet (15 mg total) by mouth at bedtime.   oxyCODONE-acetaminophen 5-325 MG tablet Commonly known as:  ROXICET Take 1-2 tablets by mouth every 4 (four) hours as needed.   OXYGEN Inhale 2 L into the lungs at bedtime.   PROAIR HFA 108 (90 Base) MCG/ACT inhaler Generic drug:  albuterol Inhale 2 puffs into the lungs every 6 (six) hours as needed for wheezing or shortness of breath.   saccharomyces boulardii 250 MG capsule Commonly known as:  FLORASTOR Take 250 mg by mouth 2 (two) times daily. For recurrent C. Diff.  Take until 2 weeks after completion of antibiotics   sertraline 25 MG tablet Commonly known as:  ZOLOFT Take 25 mg by mouth daily.   vancomycin 50 mg/mL oral solution Commonly known as:  VANCOCIN Take 125 mg by mouth 2 (two) times daily. BID x7 days, then 125 mg QD x7 days, then 125 mg QOD x7 days, then 125 mg q3 days x14 days.       Review of Systems   Constitutional: Negative for activity change, appetite change, chills, fatigue and fever.  HENT: Negative for congestion, rhinorrhea, sinus pressure, sneezing and sore throat.   Eyes: Negative.   Respiratory: Negative for cough, chest tightness, shortness of breath and wheezing.   Cardiovascular: Negative for chest pain and palpitations.  Gastrointestinal: Negative for abdominal distention, constipation, diarrhea, nausea and vomiting.       Abdominal Hernia.   Genitourinary: Negative for dysuria, frequency and urgency.  Musculoskeletal: Positive for gait problem.  Skin: Negative for color change, pallor and rash.       Left arm AV fistula. Left thigh dialysis Central line. Right ischium skin tag   Psychiatric/Behavioral: Negative for agitation, confusion, hallucinations, sleep disturbance and suicidal ideas. The patient is not nervous/anxious.  Reports feeling depressed    Immunization History  Administered Date(s) Administered  . PPD Test 03/01/2016, 04/10/2016   Pertinent  Health Maintenance Due  Topic Date Due  . INFLUENZA VACCINE  06/27/2016  . FOOT EXAM  11/27/2016 (Originally 07/23/1960)  . MAMMOGRAM  11/27/2016 (Originally 07/23/2000)  . PAP SMEAR  11/27/2016 (Originally 07/24/1971)  . OPHTHALMOLOGY EXAM  11/27/2016 (Originally 07/23/1960)  . URINE MICROALBUMIN  11/27/2016 (Originally 07/23/1960)  . DEXA SCAN  11/27/2016 (Originally 07/24/2015)  . COLONOSCOPY  11/27/2016 (Originally 07/23/2000)  . PNA vac Low Risk Adult (1 of 2 - PCV13) 11/27/2016 (Originally 07/24/2015)  . HEMOGLOBIN A1C  09/16/2016      Vitals:   07/13/16 1430  BP: 100/62  Pulse: 68  Resp: 18  Temp: 97.6 F (36.4 C)  TempSrc: Oral  SpO2: 94%  Weight: 195 lb 3.2 oz (88.5 kg)  Height: 5\' 4"  (1.626 m)   Body mass index is 33.51 kg/m. Physical Exam  Constitutional: She is oriented to person, place, and time. She appears well-developed and well-nourished. No distress.  HENT:  Head:  Normocephalic.  Mouth/Throat: Oropharynx is clear and moist. No oropharyngeal exudate.  Eyes: Conjunctivae and EOM are normal. Pupils are equal, round, and reactive to light. Right eye exhibits no discharge. Left eye exhibits no discharge. No scleral icterus.  Neck: Normal range of motion. No JVD present. No thyromegaly present.  Cardiovascular: Normal rate, regular rhythm, normal heart sounds and intact distal pulses.  Exam reveals no gallop and no friction rub.   No murmur heard. Pulmonary/Chest: Effort normal and breath sounds normal. No respiratory distress. She has no wheezes. She has no rales.  Abdominal: Soft. Bowel sounds are normal. She exhibits no distension. There is no rebound and no guarding.  Umbilical Hernia reducible but tender to touch.   Genitourinary:  Genitourinary Comments: On Hemo dialysis   Lymphadenopathy:    She has no cervical adenopathy.  Neurological: She is oriented to person, place, and time.  Skin: Skin is warm and dry. No rash noted. No erythema. No pallor.  Left AV fistula incision site dry clean and intact. Right ischium dime size skin lesion raw redness noted. No drainage noted. Surrounding skin without any signs of infections.    Psychiatric: She has a normal mood and affect.    Labs reviewed:  Recent Labs  03/29/16 0410  04/03/16 1423  05/08/16 0436  05/11/16 1231 05/11/16 1643  05/12/16 0805 05/16/16 05/23/16 06/01/16 06/16/16 0944  NA 137  < >  --   < > 137  < > 137 135  < > 135 141 143 143 139  K 3.5  < >  --   < > 3.5  < > 4.2 2.8*  --  3.6 3.9 4.3 3.6 4.5  CL 102  < >  --   < > 103  < > 101 99*  --  101  --   --   --   --   CO2 27  < >  --   < > 25  < > 27 29  --  27  --   --   --   --   GLUCOSE 238*  < >  --   < > 76  < > 154* 108*  --  109*  --   --   --  93  BUN 26*  < >  --   < > 28*  < > 32* 14  < > 20 37* 36* 52*  --  CREATININE 2.66*  < >  --   < > 6.52*  < > 6.71* 3.91*  < > 4.89* 5.9* 4.6* 5.6*  --   CALCIUM 8.0*  < >  --   < >  8.0*  < > 8.3* 8.0*  --  8.3*  --   --   --   --   MG 2.2  --  2.1  --  1.9  --   --   --   --   --   --   --   --   --   PHOS 2.7  < >  --   < >  --   < > 3.0 1.7*  --  2.6  --   --   --   --   < > = values in this interval not displayed.  Recent Labs  03/04/16 0000  03/21/16 0807  05/06/16 1240  05/11/16 1231 05/11/16 1643 05/12/16 0805 05/16/16 05/23/16 06/01/16  AST 43*  < > 22  < > 18  --   --   --   --  18 25 15   ALT 15  < > 11*  < > 11*  --   --   --   --  12 13 8   ALKPHOS 207*  < > 110  < > 180*  --   --   --   --  196* 222* 234*  BILITOT 2.3*  --  1.5*  --  1.5*  --   --   --   --   --   --   --   PROT 6.9  --  5.7*  --  7.3  --   --   --   --   --   --   --   ALBUMIN 2.9*  --  2.1*  < > 2.4*  < > 2.2* 2.2* 2.3*  --   --   --   < > = values in this interval not displayed.  Recent Labs  03/14/16 1457  03/20/16 1227  05/06/16 1240  05/09/16 0724  05/11/16 1231 05/11/16 1643 05/16/16 05/23/16 06/01/16 06/16/16 0944  WBC 13.0*  < > 7.5  < > 5.3  < > 6.6  < > 10.7* 11.9* 11.0 8.1 5.4  --   NEUTROABS 11.0*  --  5.7  --  3.3  --   --   --   --   --   --   --   --   --   HGB 9.3*  < > 9.0*  < > 9.3*  < > 9.4*  --  9.4* 9.4* 9.9* 9.6* 9.7* 11.9*  HCT 30.2*  < > 28.4*  < > 29.8*  < > 30.9*  --  30.7* 30.0* 31* 30* 31* 35.0*  MCV 89.3  --  85.5  < > 85.4  < > 87.8  --  87.2 85.2  --   --   --   --   PLT 136*  < > 165  < > 268  < > 219  --  209 198 219 229 266  --   < > = values in this interval not displayed. Lab Results  Component Value Date   TSH 1.993 03/22/2016   Lab Results  Component Value Date   HGBA1C 6.4 03/17/2016   Lab Results  Component Value Date   CHOL 49 03/17/2016   HDL 14 (A) 03/17/2016   LDLCALC 21 03/17/2016  TRIG 68 03/17/2016   Assessment/Plan Skin Lesion  Right ischium dime size irritated. Refer to Dermatologist for evaluation worrisome for irritated AK.    Family/ staff Communication:Reviewed plan with patient and facility Nurse  supervisor.  Labs/tests ordered:  None

## 2016-07-14 ENCOUNTER — Ambulatory Visit (HOSPITAL_COMMUNITY): Payer: Medicare PPO

## 2016-07-15 ENCOUNTER — Emergency Department (HOSPITAL_COMMUNITY): Payer: Medicare PPO

## 2016-07-15 ENCOUNTER — Inpatient Hospital Stay (HOSPITAL_COMMUNITY)
Admission: EM | Admit: 2016-07-15 | Discharge: 2016-07-22 | DRG: 628 | Disposition: A | Discharge status: Skilled Nursing Facility | Payer: Medicare PPO | Attending: Internal Medicine | Admitting: Internal Medicine

## 2016-07-15 DIAGNOSIS — R7881 Bacteremia: Secondary | ICD-10-CM | POA: Diagnosis present

## 2016-07-15 DIAGNOSIS — Z992 Dependence on renal dialysis: Secondary | ICD-10-CM

## 2016-07-15 DIAGNOSIS — Z86718 Personal history of other venous thrombosis and embolism: Secondary | ICD-10-CM

## 2016-07-15 DIAGNOSIS — I5042 Chronic combined systolic (congestive) and diastolic (congestive) heart failure: Secondary | ICD-10-CM | POA: Diagnosis present

## 2016-07-15 DIAGNOSIS — E1151 Type 2 diabetes mellitus with diabetic peripheral angiopathy without gangrene: Secondary | ICD-10-CM | POA: Diagnosis present

## 2016-07-15 DIAGNOSIS — R188 Other ascites: Secondary | ICD-10-CM | POA: Diagnosis present

## 2016-07-15 DIAGNOSIS — N185 Chronic kidney disease, stage 5: Secondary | ICD-10-CM

## 2016-07-15 DIAGNOSIS — E43 Unspecified severe protein-calorie malnutrition: Secondary | ICD-10-CM | POA: Diagnosis present

## 2016-07-15 DIAGNOSIS — I2510 Atherosclerotic heart disease of native coronary artery without angina pectoris: Secondary | ICD-10-CM | POA: Diagnosis present

## 2016-07-15 DIAGNOSIS — E11610 Type 2 diabetes mellitus with diabetic neuropathic arthropathy: Secondary | ICD-10-CM | POA: Diagnosis present

## 2016-07-15 DIAGNOSIS — E8770 Fluid overload, unspecified: Principal | ICD-10-CM | POA: Diagnosis present

## 2016-07-15 DIAGNOSIS — G4733 Obstructive sleep apnea (adult) (pediatric): Secondary | ICD-10-CM | POA: Diagnosis present

## 2016-07-15 DIAGNOSIS — Z794 Long term (current) use of insulin: Secondary | ICD-10-CM

## 2016-07-15 DIAGNOSIS — R5381 Other malaise: Secondary | ICD-10-CM | POA: Diagnosis present

## 2016-07-15 DIAGNOSIS — R0602 Shortness of breath: Secondary | ICD-10-CM | POA: Diagnosis not present

## 2016-07-15 DIAGNOSIS — E669 Obesity, unspecified: Secondary | ICD-10-CM | POA: Diagnosis present

## 2016-07-15 DIAGNOSIS — Z87891 Personal history of nicotine dependence: Secondary | ICD-10-CM

## 2016-07-15 DIAGNOSIS — E1169 Type 2 diabetes mellitus with other specified complication: Secondary | ICD-10-CM

## 2016-07-15 DIAGNOSIS — F329 Major depressive disorder, single episode, unspecified: Secondary | ICD-10-CM | POA: Diagnosis present

## 2016-07-15 DIAGNOSIS — I132 Hypertensive heart and chronic kidney disease with heart failure and with stage 5 chronic kidney disease, or end stage renal disease: Secondary | ICD-10-CM | POA: Diagnosis present

## 2016-07-15 DIAGNOSIS — Z6827 Body mass index (BMI) 27.0-27.9, adult: Secondary | ICD-10-CM

## 2016-07-15 DIAGNOSIS — R634 Abnormal weight loss: Secondary | ICD-10-CM | POA: Diagnosis present

## 2016-07-15 DIAGNOSIS — R1011 Right upper quadrant pain: Secondary | ICD-10-CM

## 2016-07-15 DIAGNOSIS — R112 Nausea with vomiting, unspecified: Secondary | ICD-10-CM

## 2016-07-15 DIAGNOSIS — I70209 Unspecified atherosclerosis of native arteries of extremities, unspecified extremity: Secondary | ICD-10-CM | POA: Diagnosis present

## 2016-07-15 DIAGNOSIS — E1122 Type 2 diabetes mellitus with diabetic chronic kidney disease: Secondary | ICD-10-CM | POA: Diagnosis present

## 2016-07-15 DIAGNOSIS — D631 Anemia in chronic kidney disease: Secondary | ICD-10-CM | POA: Diagnosis present

## 2016-07-15 DIAGNOSIS — I959 Hypotension, unspecified: Secondary | ICD-10-CM | POA: Diagnosis present

## 2016-07-15 DIAGNOSIS — D72829 Elevated white blood cell count, unspecified: Secondary | ICD-10-CM | POA: Diagnosis not present

## 2016-07-15 DIAGNOSIS — J449 Chronic obstructive pulmonary disease, unspecified: Secondary | ICD-10-CM | POA: Diagnosis present

## 2016-07-15 DIAGNOSIS — B965 Pseudomonas (aeruginosa) (mallei) (pseudomallei) as the cause of diseases classified elsewhere: Secondary | ICD-10-CM | POA: Diagnosis present

## 2016-07-15 DIAGNOSIS — Z8619 Personal history of other infectious and parasitic diseases: Secondary | ICD-10-CM

## 2016-07-15 DIAGNOSIS — F419 Anxiety disorder, unspecified: Secondary | ICD-10-CM | POA: Diagnosis present

## 2016-07-15 DIAGNOSIS — I10 Essential (primary) hypertension: Secondary | ICD-10-CM | POA: Diagnosis present

## 2016-07-15 DIAGNOSIS — Z8719 Personal history of other diseases of the digestive system: Secondary | ICD-10-CM | POA: Diagnosis not present

## 2016-07-15 DIAGNOSIS — K429 Umbilical hernia without obstruction or gangrene: Secondary | ICD-10-CM | POA: Diagnosis present

## 2016-07-15 DIAGNOSIS — Z95810 Presence of automatic (implantable) cardiac defibrillator: Secondary | ICD-10-CM

## 2016-07-15 DIAGNOSIS — N2581 Secondary hyperparathyroidism of renal origin: Secondary | ICD-10-CM | POA: Diagnosis present

## 2016-07-15 DIAGNOSIS — R109 Unspecified abdominal pain: Secondary | ICD-10-CM

## 2016-07-15 DIAGNOSIS — Z79899 Other long term (current) drug therapy: Secondary | ICD-10-CM

## 2016-07-15 DIAGNOSIS — N186 End stage renal disease: Secondary | ICD-10-CM | POA: Diagnosis present

## 2016-07-15 LAB — RENAL FUNCTION PANEL
Albumin: 2 g/dL — ABNORMAL LOW (ref 3.5–5.0)
Anion gap: 13 (ref 5–15)
BUN: 40 mg/dL — ABNORMAL HIGH (ref 6–20)
CO2: 25 mmol/L (ref 22–32)
Calcium: 8.5 mg/dL — ABNORMAL LOW (ref 8.9–10.3)
Chloride: 97 mmol/L — ABNORMAL LOW (ref 101–111)
Creatinine, Ser: 6.36 mg/dL — ABNORMAL HIGH (ref 0.44–1.00)
GFR calc Af Amer: 7 mL/min — ABNORMAL LOW (ref >60)
GFR calc non Af Amer: 6 mL/min — ABNORMAL LOW (ref >60)
Glucose, Bld: 152 mg/dL — ABNORMAL HIGH (ref 65–99)
Phosphorus: 4.7 mg/dL — ABNORMAL HIGH (ref 2.5–4.6)
Potassium: 4.3 mmol/L (ref 3.5–5.1)
Sodium: 135 mmol/L (ref 135–145)

## 2016-07-15 LAB — CBC WITH DIFFERENTIAL/PLATELET
BASOS PCT: 0 %
Basophils Absolute: 0 10*3/uL (ref 0.0–0.1)
EOS PCT: 2 %
Eosinophils Absolute: 0.3 10*3/uL (ref 0.0–0.7)
HCT: 31.4 % — ABNORMAL LOW (ref 36.0–46.0)
Hemoglobin: 10.2 g/dL — ABNORMAL LOW (ref 12.0–15.0)
LYMPHS ABS: 1.1 10*3/uL (ref 0.7–4.0)
Lymphocytes Relative: 8 %
MCH: 25.4 pg — ABNORMAL LOW (ref 26.0–34.0)
MCHC: 32.5 g/dL (ref 30.0–36.0)
MCV: 78.1 fL (ref 78.0–100.0)
MONO ABS: 0.9 10*3/uL (ref 0.1–1.0)
Monocytes Relative: 7 %
NEUTROS ABS: 11 10*3/uL — AB (ref 1.7–7.7)
NEUTROS PCT: 83 %
Platelets: 333 10*3/uL (ref 150–400)
RBC: 4.02 MIL/uL (ref 3.87–5.11)
RDW: 19.7 % — ABNORMAL HIGH (ref 11.5–15.5)
WBC: 13.3 10*3/uL — ABNORMAL HIGH (ref 4.0–10.5)

## 2016-07-15 LAB — URINE MICROSCOPIC-ADD ON: RBC / HPF: NONE SEEN RBC/hpf (ref 0–5)

## 2016-07-15 LAB — URINALYSIS, ROUTINE W REFLEX MICROSCOPIC
Glucose, UA: NEGATIVE mg/dL
KETONES UR: 15 mg/dL — AB
Nitrite: NEGATIVE
Protein, ur: 100 mg/dL — AB
SPECIFIC GRAVITY, URINE: 1.025 (ref 1.005–1.030)
pH: 5 (ref 5.0–8.0)

## 2016-07-15 LAB — MAGNESIUM: MAGNESIUM: 2.2 mg/dL (ref 1.7–2.4)

## 2016-07-15 LAB — PROTIME-INR
INR: 1.85
PROTHROMBIN TIME: 21.6 s — AB (ref 11.4–15.2)

## 2016-07-15 LAB — I-STAT TROPONIN, ED: Troponin i, poc: 0.03 ng/mL (ref 0.00–0.08)

## 2016-07-15 LAB — LACTIC ACID, PLASMA: Lactic Acid, Venous: 0.8 mmol/L (ref 0.5–1.9)

## 2016-07-15 LAB — BRAIN NATRIURETIC PEPTIDE: B NATRIURETIC PEPTIDE 5: 1715.6 pg/mL — AB (ref 0.0–100.0)

## 2016-07-15 LAB — CBG MONITORING, ED: GLUCOSE-CAPILLARY: 153 mg/dL — AB (ref 65–99)

## 2016-07-15 LAB — GLUCOSE, CAPILLARY: GLUCOSE-CAPILLARY: 159 mg/dL — AB (ref 65–99)

## 2016-07-15 MED ORDER — SODIUM CHLORIDE 0.9% FLUSH: 3.0000 mL | INTRAVENOUS | Status: DC | PRN

## 2016-07-15 MED ORDER — LIDOCAINE HCL (PF) 1 % IJ SOLN: 5.0000 mL | INTRAMUSCULAR | Status: DC | PRN

## 2016-07-15 MED ORDER — ALBUTEROL SULFATE (2.5 MG/3ML) 0.083% IN NEBU
5.0000 mg | INHALATION_SOLUTION | Freq: Once | RESPIRATORY_TRACT | Status: AC
  Administered 2016-07-15: 5 mg via RESPIRATORY_TRACT
  Filled 2016-07-15: qty 6

## 2016-07-15 MED ORDER — INSULIN ASPART 100 UNIT/ML ~~LOC~~ SOLN
0.0000 [IU] | Freq: Three times a day (TID) | SUBCUTANEOUS | Status: DC
  Administered 2016-07-15: 2 [IU] via SUBCUTANEOUS
  Administered 2016-07-16: 3 [IU] via SUBCUTANEOUS
  Administered 2016-07-16: 5 [IU] via SUBCUTANEOUS
  Administered 2016-07-16: 3 [IU] via SUBCUTANEOUS
  Administered 2016-07-17: 2 [IU] via SUBCUTANEOUS
  Filled 2016-07-15: qty 1

## 2016-07-15 MED ORDER — HYDROXYZINE HCL 25 MG PO TABS
50.0000 mg | ORAL_TABLET | Freq: Four times a day (QID) | ORAL | Status: DC | PRN
  Administered 2016-07-16: 50 mg via ORAL
  Filled 2016-07-15: qty 2

## 2016-07-15 MED ORDER — ACETAMINOPHEN 650 MG RE SUPP
650.0000 mg | Freq: Four times a day (QID) | RECTAL | Status: DC | PRN
  Filled 2016-07-15: qty 1

## 2016-07-15 MED ORDER — HEPARIN SODIUM (PORCINE) 1000 UNIT/ML DIALYSIS
5000.0000 [IU] | INTRAMUSCULAR | Status: DC | PRN
  Filled 2016-07-15: qty 5

## 2016-07-15 MED ORDER — INSULIN ASPART 100 UNIT/ML ~~LOC~~ SOLN: 0.0000 [IU] | Freq: Every day | SUBCUTANEOUS | Status: DC

## 2016-07-15 MED ORDER — SODIUM CHLORIDE 0.9 % IV SOLN: 100.0000 mL | INTRAVENOUS | Status: DC | PRN

## 2016-07-15 MED ORDER — LORAZEPAM 0.5 MG PO TABS: 0.5000 mg | ORAL_TABLET | Freq: Two times a day (BID) | ORAL | Status: DC | PRN

## 2016-07-15 MED ORDER — PENTAFLUOROPROP-TETRAFLUOROETH EX AERO: 1.0000 "application " | INHALATION_SPRAY | CUTANEOUS | Status: DC | PRN

## 2016-07-15 MED ORDER — SACCHAROMYCES BOULARDII 250 MG PO CAPS
250.0000 mg | ORAL_CAPSULE | Freq: Two times a day (BID) | ORAL | Status: DC
  Administered 2016-07-16 – 2016-07-22 (×13): 250 mg via ORAL
  Filled 2016-07-15 (×13): qty 1

## 2016-07-15 MED ORDER — SERTRALINE HCL 50 MG PO TABS
25.0000 mg | ORAL_TABLET | Freq: Every day | ORAL | Status: DC
  Administered 2016-07-16 – 2016-07-22 (×6): 25 mg via ORAL
  Filled 2016-07-15 (×6): qty 1

## 2016-07-15 MED ORDER — ALBUTEROL SULFATE (2.5 MG/3ML) 0.083% IN NEBU
3.0000 mL | INHALATION_SOLUTION | Freq: Four times a day (QID) | RESPIRATORY_TRACT | Status: DC | PRN
Start: 2016-07-15 — End: 2016-07-22

## 2016-07-15 MED ORDER — ALTEPLASE 2 MG IJ SOLR: 2.0000 mg | Freq: Once | INTRAMUSCULAR | Status: DC | PRN

## 2016-07-15 MED ORDER — SODIUM CHLORIDE 0.9% FLUSH
3.0000 mL | Freq: Two times a day (BID) | INTRAVENOUS | Status: DC
  Administered 2016-07-16 – 2016-07-21 (×12): 3 mL via INTRAVENOUS

## 2016-07-15 MED ORDER — DIATRIZOATE MEGLUMINE & SODIUM 66-10 % PO SOLN
ORAL | Status: AC
  Administered 2016-07-15
  Filled 2016-07-15: qty 30

## 2016-07-15 MED ORDER — LIDOCAINE-PRILOCAINE 2.5-2.5 % EX CREA: 1.0000 "application " | TOPICAL_CREAM | CUTANEOUS | Status: DC | PRN

## 2016-07-15 MED ORDER — ONDANSETRON HCL 4 MG PO TABS: 4.0000 mg | ORAL_TABLET | Freq: Four times a day (QID) | ORAL | Status: DC | PRN

## 2016-07-15 MED ORDER — OXYCODONE-ACETAMINOPHEN 5-325 MG PO TABS
ORAL_TABLET | ORAL | Status: AC
  Administered 2016-07-15: 2
  Filled 2016-07-15: qty 2

## 2016-07-15 MED ORDER — ADULT MULTIVITAMIN W/MINERALS CH
1.0000 | ORAL_TABLET | Freq: Every day | ORAL | Status: DC
  Administered 2016-07-16 – 2016-07-21 (×7): 1 via ORAL
  Filled 2016-07-15 (×8): qty 1

## 2016-07-15 MED ORDER — METHYLPREDNISOLONE SODIUM SUCC 125 MG IJ SOLR
125.0000 mg | Freq: Once | INTRAMUSCULAR | Status: AC
Start: 2016-07-15 — End: 2016-07-15
  Administered 2016-07-15: 125 mg via INTRAVENOUS
  Filled 2016-07-15: qty 2

## 2016-07-15 MED ORDER — HEPARIN SODIUM (PORCINE) 5000 UNIT/ML IJ SOLN
5000.0000 [IU] | Freq: Three times a day (TID) | INTRAMUSCULAR | Status: DC
  Administered 2016-07-16 – 2016-07-22 (×15): 5000 [IU] via SUBCUTANEOUS
  Filled 2016-07-15 (×12): qty 1

## 2016-07-15 MED ORDER — OXYCODONE-ACETAMINOPHEN 5-325 MG PO TABS
ORAL_TABLET | ORAL | Status: AC
  Administered 2016-07-15: 2 via ORAL
  Filled 2016-07-15: qty 2

## 2016-07-15 MED ORDER — INSULIN GLARGINE 100 UNIT/ML ~~LOC~~ SOLN
10.0000 [IU] | Freq: Every day | SUBCUTANEOUS | Status: DC
  Administered 2016-07-16 – 2016-07-21 (×6): 10 [IU] via SUBCUTANEOUS
  Filled 2016-07-15 (×8): qty 0.1

## 2016-07-15 MED ORDER — CARVEDILOL 3.125 MG PO TABS
3.1250 mg | ORAL_TABLET | Freq: Two times a day (BID) | ORAL | Status: DC
  Administered 2016-07-16 – 2016-07-21 (×9): 3.125 mg via ORAL
  Filled 2016-07-15 (×10): qty 1

## 2016-07-15 MED ORDER — MIRTAZAPINE 15 MG PO TABS
15.0000 mg | ORAL_TABLET | Freq: Every day | ORAL | Status: DC
  Administered 2016-07-16 – 2016-07-21 (×7): 15 mg via ORAL
  Filled 2016-07-15 (×7): qty 1

## 2016-07-15 MED ORDER — SODIUM CHLORIDE 0.9 % IV SOLN: 250.0000 mL | INTRAVENOUS | Status: DC | PRN

## 2016-07-15 MED ORDER — ONDANSETRON HCL 4 MG/2ML IJ SOLN
4.0000 mg | Freq: Four times a day (QID) | INTRAMUSCULAR | Status: DC | PRN
  Administered 2016-07-17 – 2016-07-18 (×2): 4 mg via INTRAVENOUS
  Filled 2016-07-15 (×2): qty 2

## 2016-07-15 MED ORDER — ATORVASTATIN CALCIUM 10 MG PO TABS
10.0000 mg | ORAL_TABLET | Freq: Every day | ORAL | Status: DC
  Administered 2016-07-16 – 2016-07-21 (×7): 10 mg via ORAL
  Filled 2016-07-15 (×7): qty 1

## 2016-07-15 MED ORDER — HEPARIN SODIUM (PORCINE) 1000 UNIT/ML DIALYSIS: 1000.0000 [IU] | INTRAMUSCULAR | Status: DC | PRN

## 2016-07-15 MED ORDER — FAMOTIDINE 20 MG PO TABS
20.0000 mg | ORAL_TABLET | Freq: Every day | ORAL | Status: DC
  Administered 2016-07-16: 20 mg via ORAL
  Filled 2016-07-15: qty 1

## 2016-07-15 MED ORDER — ACETAMINOPHEN 325 MG PO TABS
650.0000 mg | ORAL_TABLET | Freq: Four times a day (QID) | ORAL | Status: DC | PRN
Start: 2016-07-15 — End: 2016-07-22

## 2016-07-15 MED ORDER — BUDESONIDE 0.25 MG/2ML IN SUSP
0.2500 mg | Freq: Two times a day (BID) | RESPIRATORY_TRACT | Status: DC
  Administered 2016-07-16 – 2016-07-20 (×8): 0.25 mg via RESPIRATORY_TRACT
  Filled 2016-07-15 (×12): qty 2

## 2016-07-15 MED ORDER — BUDESONIDE 180 MCG/ACT IN AEPB: 2.0000 | INHALATION_SPRAY | Freq: Two times a day (BID) | RESPIRATORY_TRACT | Status: DC

## 2016-07-15 MED ORDER — ACETAMINOPHEN 325 MG PO TABS
ORAL_TABLET | ORAL | Status: AC
  Filled 2016-07-15: qty 2

## 2016-07-15 MED ORDER — OXYCODONE-ACETAMINOPHEN 5-325 MG PO TABS
1.0000 | ORAL_TABLET | ORAL | Status: DC | PRN
  Administered 2016-07-15: 2 via ORAL
  Administered 2016-07-17: 1 via ORAL
  Filled 2016-07-15: qty 1

## 2016-07-15 NOTE — ED Notes (Signed)
When report was called to dialysis instructed the nurse about the patients difficulty with blood work he instructed me that he would draw them from her dialysis site

## 2016-07-15 NOTE — Progress Notes (Signed)
Brandy Dudley is a 65 Y/O female with ESRD 2/2 cardiorenal syndrome, 1st HD 03/27/2016. She has hemodialysis T,Th, S at [facility]. 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   Patient has missed HD 07/13/16, shortened tx 07/11/16 2 hours 50 minutes, missed HD 07/08/16, had HD 1 hour 27 minutes 07/07/16 missed HD 07/06/16. Last full tx 07/04/16. Attributes missed HD treatments to scheduling of appointments, issues with hernia and diarrhea. Presented to HD unit today but signed off 45 minutes into treatment D/T shortness of breath, per nurse report, patient insisted on being brought to ED. Labs on arrival to ED: WBC 13.3 HGB 10.2 PLT 333, PT/INR 21.6/1.85, Troponin 0.03, CMP still pending.  CXR on arrival shows no edema or consolidation, however patients has JVD at 30 degrees, trace bilateral LE edema, bibasilar crackles. Spoke with ED physician, patient will most likely be admitted as observation patient. We will write HD orders for today and will consult formally if patient is admitted as in-patient.   Hemodialysis Orders: [facility], T, Th, S 3.0K/2.25 Ca 400/500 4 hours  Heparin: 6000 units IV q treatment Mircera 225 mcg IV q 2 weeks (last dose 06/24/16 Last in-center HGB 10.2 07/07/16) L Femoral TDC/Maturing LUA AVF No VDRA/binders on med list  Coreg 3.25 mg PO BID for HTN/HF.   Alonna Buckler, [facility] [phone] (Pager)  I have seen and examined this patient and agree with plan and assessment in the above note with renal recommendations/intervention highlighted. Stressed importance of compliance with HD. Jomarie Longs A Efren Kross,MD 07/16/2016 11:07 AM

## 2016-07-15 NOTE — ED Notes (Signed)
CBG 153; RN notified

## 2016-07-15 NOTE — ED Provider Notes (Signed)
[facility]-EMERGENCY DEPT Provider Note   CSN: [id number] Arrival date & time: 07/15/16  1306     History   Chief Complaint Chief Complaint  Patient presents with  . Shortness of Breath    HPI Brandy Dudley is a 65 y.o. female.  HPI Patient presents with worsening shortness of breath while at dialysis today. States she missed her Thursday appointment. Last dialyzed on Tuesday. She denies any fever or chills. States she has had some wheezing. Minimal cough. Denies any new lower extremity swelling. Past Medical History:  Diagnosis Date  . AICD (automatic cardioverter/defibrillator) present   . Anemia   . C. difficile colitis   . CHF (congestive heart failure) (HCC)    systolic  . COPD (chronic obstructive pulmonary disease) (HCC)   . Coronary artery disease   . Diabetes mellitus (HCC)   . DVT (deep venous thrombosis) (HCC)    on coumadin  . ESRD (end stage renal disease) (HCC)    tues/thurs/sat dialysis  . Hypertension   . Obstructive sleep apnea    no cpap  . Renal disorder     Patient Active Problem List   Diagnosis Date Noted  . Depression 07/05/2016  . Umbilical hernia without obstruction or gangrene 07/05/2016  . Generalized anxiety disorder 07/05/2016  . Loss of weight 07/05/2016  . Poor appetite 07/05/2016  . Clostridium difficile colitis 05/07/2016  . Chronic combined systolic and diastolic CHF (congestive heart failure) (HCC) 05/07/2016  . Diarrhea 05/06/2016  . RUQ pain 05/06/2016  . Anemia 05/06/2016  . Acute on chronic renal failure (HCC) 05/06/2016  . 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 Anemia of chronic renal failure, stage 5 (HCC) 04/14/2016  . Pressure ulcer 04/02/2016  . ESRD on dialysis (HCC)   . Anasarca   . Bloody diarrhea   . AKI (acute kidney injury) (HCC)   . Respiratory failure (HCC)   . Acute on chronic congestive heart failure (HCC)   . Chronic kidney disease (CKD), stage IV (severe) (HCC)   . C.  difficile colitis 03/20/2016  . COPD (chronic obstructive pulmonary disease) (HCC) 03/20/2016  . Chest wall abscess 03/20/2016  . Obstructive sleep apnea 03/20/2016  . AICD (automatic cardioverter/defibrillator) present 03/20/2016  . C. difficile diarrhea 03/20/2016  . Physical deconditioning 03/02/2016  . DVT (deep venous thrombosis), right 03/02/2016  . Chronic systolic congestive heart failure (HCC) 03/02/2016  . Cardiomyopathy, ischemic 03/02/2016  . Coronary artery disease involving native coronary artery of native heart without angina pectoris 03/02/2016  . Essential hypertension, malignant 03/02/2016  . Diabetes mellitus type 2 in obese (HCC) 03/02/2016    Past Surgical History:  Procedure Laterality Date  . AV FISTULA PLACEMENT Left 04/03/2016   Procedure: ARTERIOVENOUS (AV) FISTULA CREATION;  Surgeon: Larina Earthly, MD;  Location: [facility] OR;  Service: Vascular;  Laterality: Left;  . FISTULA SUPERFICIALIZATION Left 06/16/2016   Procedure: SUPERFICIALIZATION OF LEFT ARM BRACHIOCEPHALIC ARTERIOVENOUS FISTULA;  Surgeon: Chuck Hint, MD;  Location: [facility] OR;  Service: Vascular;  Laterality: Left;  . INSERTION OF DIALYSIS CATHETER Left 04/03/2016   Procedure: INSERTION OF DIALYSIS CATHETER;  Surgeon: Larina Earthly, MD;  Location: [facility] OR;  Service: Vascular;  Laterality: Left;  . LIGATION OF COMPETING BRANCHES OF ARTERIOVENOUS FISTULA Left 06/16/2016   Procedure: LIGATION OF COMPETING BRANCHES OF left brachiocephalic ARTERIOVENOUS FISTULA;  Surgeon: Chuck Hint, MD;  Location: [facility] OR;  Service: Vascular;  Laterality: Left;  . REVISION OF ARTERIOVENOUS GORETEX GRAFT Left  06/16/2016   Procedure: REVISION OF LEFT BRACHIOCEPHALIC ARTERIOVENOUS FISTULA WITH RESECTION OF REDUNDANT SECTION;  Surgeon: Chuck Hint, MD;  Location: [facility] OR;  Service: Vascular;  Laterality: Left;    OB History    No data available       Home Medications    Prior to Admission medications     Medication Sig Start Date End Date Taking? Authorizing Provider  acetaminophen (TYLENOL) 325 MG tablet Take 325 mg by mouth every 6 (six) hours as needed for mild pain or fever (For fever >99.5).    Yes Historical Provider, MD  albuterol (PROAIR HFA) 108 (90 Base) MCG/ACT inhaler Inhale 2 puffs into the lungs every 6 (six) hours as needed for wheezing or shortness of breath.    Yes Historical Provider, MD  atorvastatin (LIPITOR) 10 MG tablet Take 10 mg by mouth at bedtime.    Yes Historical Provider, MD  budesonide (PULMICORT) 180 MCG/ACT inhaler Inhale 2 puffs into the lungs 2 (two) times daily.   Yes Historical Provider, MD  carvedilol (COREG) 3.125 MG tablet Take 3.125 mg by mouth See admin instructions. Pt to take 1 tablet by mouth twice daily EXCEPT on DIALYSIS DAYS ( Mon Wed and Fri) Pt to only take 1 tablet daily   Yes Historical Provider, MD  famotidine (PEPCID) 20 MG tablet Take 20 mg by mouth daily.   Yes Historical Provider, MD  hydrOXYzine (ATARAX/VISTARIL) 50 MG tablet Take 50 mg by mouth every 6 (six) hours as needed for itching.   Yes Historical Provider, MD  insulin glargine (LANTUS) 100 unit/mL SOPN Inject 0.1 mLs (10 Units total) into the skin at bedtime. 05/09/16  Yes Catarina Hartshorn, MD  insulin lispro (HUMALOG) 100 UNIT/ML injection Inject 5 Units into the skin 3 (three) times daily as needed for high blood sugar (CBG >180).    Yes Historical Provider, MD  Inulin (FIBER CHOICE PO) Take 2 tablets by mouth daily as needed (constipation).   Yes Historical Provider, MD  loperamide (IMODIUM A-D) 2 MG tablet Take 2 mg by mouth 4 (four) times daily as needed for diarrhea or loose stools.   Yes Historical Provider, MD  LORazepam (ATIVAN) 0.5 MG tablet Take 0.5 mg by mouth 2 (two) times daily as needed for anxiety.   Yes Historical Provider, MD  Multiple Vitamins-Minerals (CERTAVITE/ANTIOXIDANTS) TABS Take 1 tablet by mouth at bedtime.   Yes Historical Provider, MD  saccharomyces boulardii  (FLORASTOR) 250 MG capsule Take 250 mg by mouth 2 (two) times daily. For recurrent C. Diff.  Take until 2 weeks after completion of antibiotics   Yes Historical Provider, MD  mirtazapine (REMERON) 15 MG tablet Take 1 tablet (15 mg total) by mouth at bedtime. 07/05/16   Dinah C Ngetich, NP  oxyCODONE-acetaminophen (ROXICET) 5-325 MG tablet Take 1-2 tablets by mouth every 4 (four) hours as needed. 06/19/16   Sharon Seller, NP  OXYGEN Inhale 2 L into the lungs at bedtime.    Historical Provider, MD  sertraline (ZOLOFT) 25 MG tablet Take 25 mg by mouth daily.    Historical Provider, MD  vancomycin (VANCOCIN) 50 mg/mL oral solution Take 125 mg by mouth 2 (two) times daily. BID x7 days, then 125 mg QD x7 days, then 125 mg QOD x7 days, then 125 mg q3 days x14 days.    Historical Provider, MD    Family History No family history on file.  Social History Social History  Substance Use Topics  . Smoking status: Former Smoker  Types: Cigarettes    Quit date: 03/03/2006  . Smokeless tobacco: Never Used  . Alcohol use No     Allergies   Entresto [sacubitril-valsartan] and Sulfa antibiotics   Review of Systems Review of Systems  Constitutional: Negative for chills and fever.  Respiratory: Positive for cough, shortness of breath and wheezing.   Cardiovascular: Negative for chest pain, palpitations and leg swelling.  Gastrointestinal: Negative for abdominal pain, diarrhea and nausea.  Musculoskeletal: Negative for back pain, myalgias, neck pain and neck stiffness.  Skin: Negative for rash and wound.  Neurological: Negative for dizziness, weakness, light-headedness, numbness and headaches.  All other systems reviewed and are negative.    Physical Exam Updated Vital Signs BP 116/85   Pulse 76   Temp 97.9 F (36.6 C) (Oral)   Resp 15   Ht 5\' 4"  (1.626 m)   Wt 196 lb 3.4 oz (89 kg)   SpO2 92%   BMI 33.68 kg/m   Physical Exam  Constitutional: She is oriented to person, place, and time.  She appears well-developed and well-nourished.  HENT:  Head: Normocephalic and atraumatic.  Mouth/Throat: Oropharynx is clear and moist.  Eyes: EOM are normal. Pupils are equal, round, and reactive to light.  Neck: Normal range of motion. Neck supple. JVD present.  Cardiovascular: Normal rate and regular rhythm.   Pulmonary/Chest: Effort normal. She has wheezes.  Patient with few scattered end expiratory wheezes. Also has crackles bilateral bases.  Abdominal: Soft. Bowel sounds are normal. She exhibits distension. There is no tenderness. There is no rebound and no guarding.  Musculoskeletal: Normal range of motion. She exhibits edema (1+ bilateral pretibial edema). She exhibits no tenderness.  Neurological: She is alert and oriented to person, place, and time.  Moves all extremities without deficit. Sensation is fully intact.  Skin: Skin is warm and dry. No rash noted. No erythema.  Left femoral dialysis catheter in place. No obvious swelling or erythema.  Psychiatric: She has a normal mood and affect. Her behavior is normal.  Nursing note and vitals reviewed.    ED Treatments / Results  Labs (all labs ordered are listed, but only abnormal results are displayed) Labs Reviewed  BRAIN NATRIURETIC PEPTIDE - Abnormal; Notable for the following:       Result Value   B Natriuretic Peptide 1,715.6 (*)    All other components within normal limits  CBC WITH DIFFERENTIAL/PLATELET - Abnormal; Notable for the following:    WBC 13.3 (*)    Hemoglobin 10.2 (*)    HCT 31.4 (*)    MCH 25.4 (*)    RDW 19.7 (*)    Neutro Abs 11.0 (*)    All other components within normal limits  PROTIME-INR - Abnormal; Notable for the following:    Prothrombin Time 21.6 (*)    All other components within normal limits  CULTURE, BLOOD (ROUTINE X 2)  CULTURE, BLOOD (ROUTINE X 2)  COMPREHENSIVE METABOLIC PANEL  LACTIC ACID, PLASMA  I-STAT TROPOININ, ED    EKG  EKG Interpretation  Date/Time:  Saturday  July 15 2016 13:16:10 EDT Ventricular Rate:  80 PR Interval:    QRS Duration: 105 QT Interval:  424 QTC Calculation: 490 R Axis:   35 Text Interpretation:  Sinus rhythm Low voltage, extremity leads Borderline prolonged QT interval Confirmed by Ranae Palms  MD, Valda Christenson ([id number]) on 07/15/2016 1:39:18 PM       Radiology Dg Chest Port 1 View  Result Date: 07/15/2016 CLINICAL DATA:  Shortness of Breath EXAM:  PORTABLE CHEST 1 VIEW COMPARISON:  March 23, 2016 FINDINGS: There is no edema or consolidation. There is cardiomegaly with pulmonary vascularity within normal limits. Pacemaker lead is attached to the right ventricle. There is atherosclerotic calcification in the aorta. No adenopathy. There is degenerative change in each shoulder. IMPRESSION: Cardiomegaly.  Aortic atherosclerosis.  No edema or consolidation. Electronically Signed   By: Bretta BangWilliam  Woodruff III M.D.   On: 07/15/2016 14:34    Procedures Procedures (including critical care time)  Medications Ordered in ED Medications  albuterol (PROVENTIL) (2.5 MG/3ML) 0.083% nebulizer solution 5 mg (5 mg Nebulization Given 07/15/16 1341)  methylPREDNISolone sodium succinate (SOLU-MEDROL) 125 mg/2 mL injection 125 mg (125 mg Intravenous Given 07/15/16 1341)     Initial Impression / Assessment and Plan / ED Course  I have reviewed the triage vital signs and the nursing notes.  Pertinent labs & imaging results that were available during my care of the patient were reviewed by me and considered in my medical decision making (see chart for details).  Clinical Course   Patient's shortness of breath likely combination of fluid overload and some exacerbation of her COPD.  Dr Manson PasseyBrown will arrange HD and discussed with [facility] for obs admission.  Final Clinical Impressions(s) / ED Diagnoses   Final diagnoses:  SOB (shortness of breath)  Hypervolemia, unspecified hypervolemia type    New Prescriptions New Prescriptions   No medications on file      Loren Raceravid Mishika Flippen, MD 07/15/16 1624

## 2016-07-15 NOTE — ED Notes (Signed)
Gave pt water to drink per Dr. Rennis Harding

## 2016-07-15 NOTE — ED Notes (Signed)
Lab called and stated cmp was hemolyzed, phlebotomy contacted to restick

## 2016-07-15 NOTE — ED Notes (Signed)
Waiting for lab and x ray results, patient updated

## 2016-07-15 NOTE — ED Triage Notes (Signed)
Pt c/o SOB while at HD, pt underweight upon arrival to HD facility per EMS, pt presents to ED post 45 mins of HD tx d/t SOB, pt denies CP, reports knot to L inner clavicle area post being pulled up in bed earlier this wk @ SNF, pt A&O x4, follows commands, speaks in complete sentences

## 2016-07-15 NOTE — H&P (Signed)
History and Physical    Brandy Dudley MVH:[id number] DOB: Jan 18, 1951 DOA: 07/15/2016   PCP: [name], MD   Patient coming from/Resides with: Skilled nursing facility  Chief Complaint: Shortness of breath  HPI: Brandy Dudley is a 65 y.o. female with medical history significant for chronic kidney disease on dialysis, history of recurrent C. Difficile colitis (initial treatment April 2017 with second treatment June 2017), umbilical hernia, OSA, diabetes, AICD in place, physical deconditioning, anemia of chronic kidney disease, Charcot foot who presented to the ER after developing shortness of breath during dialysis treatment and inability to tolerate outpatient dialysis treatment due to same. Patient reports that this past Thursday she missed a dialysis treatment to have an outpatient abdominal ultrasound obtained. It is noted she was unable to complete treatment today. Patient has already been evaluated by the nephrology team on the ER and despite having a negative chest x-ray clinical exam is consistent with volume overload so plans are to pursue dialysis as soon as possible today.  In addition to the above complaints patient has also been having issues with postprandial right upper quadrant abdominal pain associated with nausea and vomiting, very limited oral intake and subsequent 14 pound weight loss. This is documented in the nursing home. Patient confirms. Patient reports he still makes urine. She denies recently being told that she has had fevers and she denies chills. She is not having any diarrhea. She reports nonambulatory secondary to difficulty ambulating on right Charcot foot.  ED Course:  Vital signs: 97.9-131/95-80-17-room air 100% Portable chest x-ray: No edema or consolidation Lab data: CMET hemolyzed and specimen recollection pending, BNP 1715, poc troponin 0.03, white count 13,300 with neutrophils 83% and absolute refills 11%, hemoglobin 10.2, platelets 333,000, PT 21.6, INR  1.5  Review of Systems:  In addition to the HPI above,  No Fever-chills, myalgias or other constitutional symptoms No Headache, changes with Vision or hearing, new weakness, tingling, numbness in any extremity, No problems swallowing food or Liquids, indigestion/reflux No Chest pain, Cough, palpitations, orthopnea or DOE No Abdominal pain, N/V; no melena or hematochezia, no dark tarry stools No dysuria, hematuria or flank pain No new skin rashes, lesions, masses or bruises, No new joints pains-aches No recent weight gain  No polyuria, polydypsia or polyphagia,   Past Medical History:  Diagnosis Date  . AICD (automatic cardioverter/defibrillator) present   . Anemia   . C. difficile colitis   . CHF (congestive heart failure) (HCC)    systolic  . COPD (chronic obstructive pulmonary disease) (HCC)   . Coronary artery disease   . Diabetes mellitus (HCC)   . DVT (deep venous thrombosis) (HCC)    on coumadin  . ESRD (end stage renal disease) (HCC)    tues/thurs/sat dialysis  . Hypertension   . Obstructive sleep apnea    no cpap  . Renal disorder     Past Surgical History:  Procedure Laterality Date  . AV FISTULA PLACEMENT Left 04/03/2016   Procedure: ARTERIOVENOUS (AV) FISTULA CREATION;  Surgeon: Larina Earthly, MD;  Location: [facility] OR;  Service: Vascular;  Laterality: Left;  . FISTULA SUPERFICIALIZATION Left 06/16/2016   Procedure: SUPERFICIALIZATION OF LEFT ARM BRACHIOCEPHALIC ARTERIOVENOUS FISTULA;  Surgeon: Chuck Hint, MD;  Location: [facility] OR;  Service: Vascular;  Laterality: Left;  . INSERTION OF DIALYSIS CATHETER Left 04/03/2016   Procedure: INSERTION OF DIALYSIS CATHETER;  Surgeon: Larina Earthly, MD;  Location: [facility] OR;  Service: Vascular;  Laterality: Left;  . LIGATION OF COMPETING BRANCHES OF  ARTERIOVENOUS FISTULA Left 06/16/2016   Procedure: LIGATION OF COMPETING BRANCHES OF left brachiocephalic ARTERIOVENOUS FISTULA;  Surgeon: Chuck Hint, MD;  Location: [facility] OR;   Service: Vascular;  Laterality: Left;  . REVISION OF ARTERIOVENOUS GORETEX GRAFT Left 06/16/2016   Procedure: REVISION OF LEFT BRACHIOCEPHALIC ARTERIOVENOUS FISTULA WITH RESECTION OF REDUNDANT SECTION;  Surgeon: Chuck Hint, MD;  Location: [facility] OR;  Service: Vascular;  Laterality: Left;    Social History   Social History  . Marital status: Single    Spouse name: N/A  . Number of children: N/A  . Years of education: N/A   Occupational History  . Not on file.   Social History Main Topics  . Smoking status: Former Smoker    Types: Cigarettes    Quit date: 03/03/2006  . Smokeless tobacco: Never Used  . Alcohol use No  . Drug use: No  . Sexual activity: No   Other Topics Concern  . Not on file   Social History Narrative  . No narrative on file    Mobility: Patient reports nonambulatory and primarily uses a wheelchair for the past week has been reluctant to get up to the wheelchair because of her Charcot foot changes Work history: Disabled   Allergies  Allergen Reactions  . Entresto [Sacubitril-Valsartan] Other (See Comments)     unknown reaction  . Sulfa Antibiotics Itching    Family history reviewed and not pertinent to current admission diagnosis  Prior to Admission medications   Medication Sig Start Date End Date Taking? Authorizing Provider  acetaminophen (TYLENOL) 325 MG tablet Take 325 mg by mouth every 6 (six) hours as needed for mild pain or fever (For fever >99.5).    Yes Historical Provider, MD  albuterol (PROAIR HFA) 108 (90 Base) MCG/ACT inhaler Inhale 2 puffs into the lungs every 6 (six) hours as needed for wheezing or shortness of breath.    Yes Historical Provider, MD  Amino Acids-Protein Hydrolys (FEEDING SUPPLEMENT, PRO-STAT SUGAR FREE 64,) LIQD Take 30 mLs by mouth daily.   Yes Historical Provider, MD  atorvastatin (LIPITOR) 10 MG tablet Take 10 mg by mouth at bedtime.    Yes Historical Provider, MD  Budesonide (PULMICORT FLEXHALER) 90 MCG/ACT  inhaler Inhale 2 puffs into the lungs 2 (two) times daily.   Yes Historical Provider, MD  carvedilol (COREG) 3.125 MG tablet Take 3.125 mg by mouth 2 (two) times daily with a meal. Hold for SBP <119 or HR <60   Yes Historical Provider, MD  famotidine (PEPCID) 20 MG tablet Take 20 mg by mouth 2 (two) times daily.    Yes Historical Provider, MD  hydrOXYzine (ATARAX/VISTARIL) 50 MG tablet Take 50 mg by mouth every 6 (six) hours as needed for itching.   Yes Historical Provider, MD  insulin glargine (LANTUS) 100 unit/mL SOPN Inject 0.1 mLs (10 Units total) into the skin at bedtime. 05/09/16  Yes Catarina Hartshorn, MD  insulin lispro (HUMALOG) 100 UNIT/ML injection Inject 0-10 Units into the skin 3 (three) times daily with meals. CBG 0-59 Hypoglycemic Protocol, 60-149 0 units 150-250 5 units, 251-300 8 units, 301-350 10 units, >350 call MD   Yes Historical Provider, MD  Inulin (FIBER CHOICE PO) Take 2 tablets by mouth daily as needed (constipation).   Yes Historical Provider, MD  LORazepam (ATIVAN) 0.5 MG tablet Take 0.5 mg by mouth 2 (two) times daily as needed for anxiety.   Yes Historical Provider, MD  Multiple Vitamins-Minerals (CERTAVITE/ANTIOXIDANTS) TABS Take 1 tablet by mouth at  bedtime.   Yes Historical Provider, MD  oxyCODONE-acetaminophen (ROXICET) 5-325 MG tablet Take 1-2 tablets by mouth every 4 (four) hours as needed. Patient taking differently: Take 1-2 tablets by mouth every 4 (four) hours as needed (mild pain (1 tablet )/ severe pain (2 tablets)).  06/19/16  Yes Sharon SellerJessica K Eubanks, NP  OXYGEN Inhale 2 L into the lungs at bedtime.   Yes Historical Provider, MD  saccharomyces boulardii (FLORASTOR) 250 MG capsule Take 250 mg by mouth 2 (two) times daily. For recurrent C. Diff.  Take 1 tablet (250 mg) by mouth twice daily until 2 weeks after completion of antibiotics (stop date 08/05/16   Yes Historical Provider, MD  sertraline (ZOLOFT) 25 MG tablet Take 25 mg by mouth at bedtime.    Yes Historical Provider,  MD  vancomycin (VANCOCIN) 50 mg/mL oral solution Take 125 mg by mouth See admin instructions. Tapered course started 06/25/16: take 125 mg daily for 7 days, then take 125 mg every other day for 7 days, then take 125 mg every 3 days at 8am for 14 days, then stop   Yes Historical Provider, MD  loperamide (IMODIUM A-D) 2 MG tablet Take 2 mg by mouth 4 (four) times daily as needed for diarrhea or loose stools.    Historical Provider, MD  mirtazapine (REMERON) 15 MG tablet Take 1 tablet (15 mg total) by mouth at bedtime. 07/05/16   Donalee Citrininah C Ngetich, NP    Physical Exam: Vitals:   07/15/16 1530 07/15/16 1545 07/15/16 1630 07/15/16 1645  BP: 139/91 145/92 130/88 134/90  Pulse: 78 80 80 79  Resp: (!) 9 21 12 13   Temp:      TempSrc:      SpO2: 94% 93% 95% 95%  Weight:      Height:          Constitutional: NAD, calm, comfortable Eyes: PERRL, lids and conjunctivae normal ENMT: Mucous membranes are moist. Posterior pharynx clear of any exudate or lesions.Normal dentition.  Neck: normal, supple, no masses, no thyromegaly Respiratory: Diffuse bilateral crackles posteriorly, no wheezing. Normal respiratory effort. No accessory muscle use. Nasal cannula oxygen has been applied Cardiovascular: Regular rate and rhythm, no murmurs / rubs / gallops. No extremity edema. 2+ pedal pulses. No carotid bruits.  Abdomen: Focally tender with minimal palpation right upper quadrant, no masses palpated. No hepatosplenomegaly. Bowel sounds positive. Umbilical hernia soft and easily reducible although does not stay reduce  Musculoskeletal: no clubbing / cyanosis. No joint deformity upper and lower extremities except for Charcot changes right foot. Good ROM, no contractures. Normal muscle tone. Skin: no rashes, lesions, ulcers. No induration Neurologic: CN 2-12 grossly intact. Sensation intact, DTR normal. Strength 5/5 x all 4 extremities.  Psychiatric: Normal judgment and insight. Alert and oriented x 3. Normal mood.    Other: Left upper extremity AV fistula site without drainage or erythema or swelling; left groin dialysis access site unremarkable   Labs on Admission: I have personally reviewed following labs and imaging studies  CBC:  Recent Labs Lab 07/15/16 1331  WBC 13.3*  NEUTROABS 11.0*  HGB 10.2*  HCT 31.4*  MCV 78.1  PLT 333   Basic Metabolic Panel: No results for input(s): NA, K, CL, CO2, GLUCOSE, BUN, CREATININE, CALCIUM, MG, PHOS in the last 168 hours. GFR: CrCl cannot be calculated (Patient's most recent lab result is older than the maximum 21 days allowed.). Liver Function Tests: No results for input(s): AST, ALT, ALKPHOS, BILITOT, PROT, ALBUMIN in the last 168 hours.  No results for input(s): LIPASE, AMYLASE in the last 168 hours. No results for input(s): AMMONIA in the last 168 hours. Coagulation Profile:  Recent Labs Lab 07/15/16 1331  INR 1.85   Cardiac Enzymes: No results for input(s): CKTOTAL, CKMB, CKMBINDEX, TROPONINI in the last 168 hours. BNP (last 3 results) No results for input(s): PROBNP in the last 8760 hours. HbA1C: No results for input(s): HGBA1C in the last 72 hours. CBG:  Recent Labs Lab 07/15/16 1654  GLUCAP 153*   Lipid Profile: No results for input(s): CHOL, HDL, LDLCALC, TRIG, CHOLHDL, LDLDIRECT in the last 72 hours. Thyroid Function Tests: No results for input(s): TSH, T4TOTAL, FREET4, T3FREE, THYROIDAB in the last 72 hours. Anemia Panel: No results for input(s): VITAMINB12, FOLATE, FERRITIN, TIBC, IRON, RETICCTPCT in the last 72 hours. Urine analysis:    Component Value Date/Time   COLORURINE AMBER (A) 03/14/2016 1655   APPEARANCEUR CLOUDY (A) 03/14/2016 1655   LABSPEC 1.019 03/14/2016 1655   PHURINE 5.0 03/14/2016 1655   GLUCOSEU NEGATIVE 03/14/2016 1655   HGBUR LARGE (A) 03/14/2016 1655   BILIRUBINUR SMALL (A) 03/14/2016 1655   KETONESUR 15 (A) 03/14/2016 1655   PROTEINUR 30 (A) 03/14/2016 1655   NITRITE NEGATIVE 03/14/2016 1655    LEUKOCYTESUR TRACE (A) 03/14/2016 1655   Sepsis Labs: @LABRCNTIP (procalcitonin:4,lacticidven:4) )No results found for this or any previous visit (from the past 240 hour(s)).   Radiological Exams on Admission: Dg Chest Port 1 View  Result Date: 07/15/2016 CLINICAL DATA:  Shortness of Breath EXAM: PORTABLE CHEST 1 VIEW COMPARISON:  March 23, 2016 FINDINGS: There is no edema or consolidation. There is cardiomegaly with pulmonary vascularity within normal limits. Pacemaker lead is attached to the right ventricle. There is atherosclerotic calcification in the aorta. No adenopathy. There is degenerative change in each shoulder. IMPRESSION: Cardiomegaly.  Aortic atherosclerosis.  No edema or consolidation. Electronically Signed   By: Bretta Bang III M.D.   On: 07/15/2016 14:34    EKG: (Independently reviewed) sinus rhythm with ventricular rate 80 bpm, QTC 490 ms, low voltage complexes and extremity leads, no acute ischemic changes  Assessment/Plan Principal Problem:   Volume overload/ESRD on dialysis  -Patient presents with shortness of breath in setting of missed dialysis with clinical exam consistent with volume overload -Nephrology plans urgent dialysis today; unclear if she will require additional days of dialysis and therefore may need to change to inpatient status-await utilization review RN input -Oxygen prn and nightly at at bedtime  Active Problems:   History of Clostridium difficile colitis/RUQ pain -Patient has been having issues with recurrent nausea and vomiting and right upper quadrant abdominal pain with weight loss and has been evaluated by outpatient GI and started on Pepcid -Had abdominal ultrasound as an outpatient-results unknown -No diarrhea but does have leukocytosis therefore will repeat CT of the abdomen to determine if she has residual colitis -Completed oral vancomycin after last hospitalization in June/July    Leukocytosis -See above regarding possible  colitis -Also dialyzes via a thigh access therefore check blood cultures to rule out bacteremia -Continues to avoid so check urinalysis and culture; obtain catheterized specimen -CBC and a chem    Obstructive sleep apnea -Per nursing home records patient does not utilize CPAP    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  -Current CBG 153 -Continue Lantus plus sliding scale insulin    Loss of weight -As above -Nutrition consultation    Physical deconditioning -PT and OT evaluation -May benefit from some sort of boot  for offloading device for right Charcot foot to aid in improving mobilization    HTN -Blood pressure currently controlled    AICD (automatic cardioverter/defibrillator) present/history of systolic heart failure -Management with regular dialysis treatments -Continue other medical therapy with carvedilol    Anemia of chronic renal failure, stage 5  -Baseline hemoglobin between 9.6 and 10.2    Umbilical hernia without obstruction or gangrene  -Hernia unremarkable on today's exam    DVT prophylaxis: Subcutaneous heparin Code Status: Full Family Communication: No family at bedside  Disposition Plan: Anticipate will return to skilled nursing facility once medically stable Consults called: Nephrologist on call Admission status: Observation with low threshold for transitioning to inpatient status since she is requiring dialysis/nephrology floor    ELLIS,ALLISON L. ANP-BC [facility] Pager [phone]   If 7PM-7AM, please contact night-coverage [email or web address] Password [email or web address]  07/15/2016, 5:15 PM

## 2016-07-15 NOTE — Progress Notes (Signed)
Patient arrived to unit by ED stretcher.  Reviewed treatment plan and this RN agrees with plan.  Report received from bedside RN, Toni Amend.  Consent obtained.  Patient A & O X 4.   Lung sounds diminished to ausculation in all fields. Generalized BLE edema. Cardiac:  NSR.  Removed caps and cleansed L thigh catheter with chlorhedxidine.  Aspirated ports of heparin and flushed them with saline per protocol.  Connected and secured lines, initiated treatment at 1753.  UF Goal of and net fluid removal 3L.  Will continue to monitor.

## 2016-07-15 NOTE — Progress Notes (Signed)
Dialysis treatment completed.  3500 mL ultrafiltrated.  3000 mL net fluid removal.  Patient status unchanged. Lung sounds diminished to ausculation in all fields. Generalized edema. Cardiac: NSR.  Cleansed L thigh catheter with chlorhexidine.  Disconnected lines and flushed ports with saline per protocol.  Ports locked with heparin and capped per protocol.    Report given to bedside, RN Christel Mormon.

## 2016-07-15 NOTE — ED Notes (Signed)
Lab called and stated re-stick for cmp did not have enough blood in it, will redraw

## 2016-07-15 NOTE — ED Notes (Signed)
Lab called and stated that cmp redraw was hemolyzed, will redraw

## 2016-07-16 ENCOUNTER — Observation Stay (HOSPITAL_COMMUNITY): Payer: Medicare PPO

## 2016-07-16 DIAGNOSIS — Z95810 Presence of automatic (implantable) cardiac defibrillator: Secondary | ICD-10-CM | POA: Diagnosis not present

## 2016-07-16 DIAGNOSIS — I70209 Unspecified atherosclerosis of native arteries of extremities, unspecified extremity: Secondary | ICD-10-CM | POA: Diagnosis not present

## 2016-07-16 DIAGNOSIS — E8779 Other fluid overload: Secondary | ICD-10-CM | POA: Diagnosis not present

## 2016-07-16 DIAGNOSIS — N185 Chronic kidney disease, stage 5: Secondary | ICD-10-CM | POA: Diagnosis not present

## 2016-07-16 DIAGNOSIS — R7881 Bacteremia: Secondary | ICD-10-CM | POA: Diagnosis not present

## 2016-07-16 DIAGNOSIS — N186 End stage renal disease: Secondary | ICD-10-CM | POA: Diagnosis not present

## 2016-07-16 LAB — RENAL FUNCTION PANEL
ALBUMIN: 1.9 g/dL — AB (ref 3.5–5.0)
ANION GAP: 12 (ref 5–15)
BUN: 26 mg/dL — AB (ref 6–20)
CALCIUM: 8.6 mg/dL — AB (ref 8.9–10.3)
CO2: 26 mmol/L (ref 22–32)
Chloride: 97 mmol/L — ABNORMAL LOW (ref 101–111)
Creatinine, Ser: 4.62 mg/dL — ABNORMAL HIGH (ref 0.44–1.00)
GFR calc Af Amer: 11 mL/min — ABNORMAL LOW (ref >60)
GFR, EST NON AFRICAN AMERICAN: 9 mL/min — AB (ref >60)
GLUCOSE: 230 mg/dL — AB (ref 65–99)
PHOSPHORUS: 4.3 mg/dL (ref 2.5–4.6)
Potassium: 4 mmol/L (ref 3.5–5.1)
SODIUM: 135 mmol/L (ref 135–145)

## 2016-07-16 LAB — MRSA PCR SCREENING: MRSA by PCR: NEGATIVE

## 2016-07-16 LAB — URINE CULTURE: CULTURE: NO GROWTH

## 2016-07-16 LAB — CBC
HCT: 31.5 % — ABNORMAL LOW (ref 36.0–46.0)
HEMOGLOBIN: 10 g/dL — AB (ref 12.0–15.0)
MCH: 24.6 pg — ABNORMAL LOW (ref 26.0–34.0)
MCHC: 31.7 g/dL (ref 30.0–36.0)
MCV: 77.6 fL — ABNORMAL LOW (ref 78.0–100.0)
Platelets: 220 10*3/uL (ref 150–400)
RBC: 4.06 MIL/uL (ref 3.87–5.11)
RDW: 18.5 % — ABNORMAL HIGH (ref 11.5–15.5)
WBC: 8.9 10*3/uL (ref 4.0–10.5)

## 2016-07-16 LAB — GLUCOSE, CAPILLARY
GLUCOSE-CAPILLARY: 206 mg/dL — AB (ref 65–99)
GLUCOSE-CAPILLARY: 167 mg/dL — AB (ref 65–99)
Glucose-Capillary: 261 mg/dL — ABNORMAL HIGH (ref 65–99)
Glucose-Capillary: 211 mg/dL — ABNORMAL HIGH (ref 65–99)

## 2016-07-16 LAB — HEMOGLOBIN A1C
HEMOGLOBIN A1C: 5.6 % (ref 4.8–5.6)
MEAN PLASMA GLUCOSE: 114 mg/dL

## 2016-07-16 LAB — HEPATITIS B SURFACE ANTIGEN: Hepatitis B Surface Ag: NEGATIVE

## 2016-07-16 MED ORDER — FAMOTIDINE 20 MG PO TABS
20.0000 mg | ORAL_TABLET | Freq: Two times a day (BID) | ORAL | Status: DC
  Administered 2016-07-16: 20 mg via ORAL
  Filled 2016-07-16: qty 1

## 2016-07-16 NOTE — Evaluation (Signed)
Physical Therapy Evaluation Patient Details Name: Brandy Dudley MRN: [id number] DOB: 21-Mar-1951 Today's Date: 07/16/2016   History of Present Illness  Brandy Dudley is a 65 y.o. female with medical history significant for chronic kidney disease on dialysis, history of recurrent C. Difficile colitis (initial treatment April 2017 with second treatment June 2017), umbilical hernia, OSA, diabetes, AICD in place, physical deconditioning, anemia of chronic kidney disease, Charcot foot who presented to the ER after developing shortness of breath during dialysis treatment and inability to tolerate outpatient dialysis treatment due to same.  Clinical Impression   Pt admitted with above diagnosis. Pt currently with functional limitations due to the deficits listed below (see PT Problem List).  Pt will benefit from skilled PT to increase their independence and safety with mobility to allow discharge to the venue listed below.       Follow Up Recommendations SNF (for continuing rehab)    Equipment Recommendations  Other (comment) (deferred to SNF)    Recommendations for Other Services       Precautions / Restrictions Precautions Precautions: Fall      Mobility  Bed Mobility Overal bed mobility: Needs Assistance Bed Mobility: Supine to Sit     Supine to sit: Mod assist     General bed mobility comments: Mod assist to pull to sit; moved her feet off of the bed in prep for sitting up without physical assist  Transfers Overall transfer level: Needs assistance Equipment used: Rolling walker (2 wheeled) Transfers: Sit to/from [employer] Sit to Stand: Mod assist Stand pivot transfers: Mod assist       General transfer comment: Mod assist to power up; Cues for hand placement  Ambulation/Gait                Stairs            Wheelchair Mobility    Modified Rankin (Stroke Patients Only)       Balance Overall balance assessment: Needs assistance   Sitting  balance-Leahy Scale: Fair       Standing balance-Leahy Scale: Poor                               Pertinent Vitals/Pain Pain Assessment: No/denies pain    Home Living Family/patient expects to be discharged to:: Skilled nursing facility                 Additional Comments: From [facility]    Prior Function Level of Independence: Needs assistance   Gait / Transfers Assistance Needed: SPC at home and RW more recently  ADL's / Homemaking Assistance Needed: was living alone and doing her own housework  Comments: Was managing as above until her last admission approx 2 months ago; since then has been at [facility] for [employer]   Dominant Hand: Right    Extremity/Trunk Assessment   Upper Extremity Assessment: Generalized weakness (LUE painful)           Lower Extremity Assessment: Generalized weakness (HD cath at her L thigh is tunneled/permanent)         Communication   Communication: No difficulties  Cognition Arousal/Alertness: Awake/alert Behavior During Therapy: WFL for tasks assessed/performed (but reports being nervous) Overall Cognitive Status: Within Functional Limits for tasks assessed                      General Comments General comments (skin  integrity, edema, etc.): Maleeya, NT assisted with washing up during mobility, transfer activities    Exercises        Assessment/Plan    PT Assessment Patient needs continued PT services  PT Diagnosis Difficulty walking;Generalized weakness   PT Problem List Decreased strength;Decreased range of motion;Decreased activity tolerance;Decreased balance;Decreased mobility;Decreased knowledge of use of DME;Pain  PT Treatment Interventions DME instruction;Gait training;Functional mobility training;Therapeutic activities;Therapeutic exercise;Balance training;Patient/family education   PT Goals (Current goals can be found in the Care Plan section) Acute Rehab PT  Goals Patient Stated Goal: wants to get stronger PT Goal Formulation: With patient Time For Goal Achievement: 07/30/16 Potential to Achieve Goals: Good    Frequency Min 2X/week   Barriers to discharge        Co-evaluation               End of Session Equipment Utilized During Treatment: Gait belt Activity Tolerance: Patient tolerated treatment well Patient left: Other (comment) (on BSC with Maleeya, NT finishing bath) Nurse Communication: Mobility status    Functional Assessment Tool Used: Clinical Judgement Functional Limitation: Mobility: Walking and moving around Mobility: Walking and Moving Around Current Status ([id number]([id number]): At least 40 percent but less than 60 percent impaired, limited or restricted Mobility: Walking and Moving Around Goal Status [phone](G8979): At least 1 percent but less than 20 percent impaired, limited or restricted    Time: [phone] [phone] PT Time Calculation (min) (ACUTE ONLY): 37 min   Charges:   PT Evaluation $PT Eval Moderate Complexity: 1 Procedure PT Treatments $Therapeutic Activity: 8-22 mins   PT G Codes:   PT G-Codes **NOT FOR INPATIENT CLASS** Functional Assessment Tool Used: Clinical Judgement Functional Limitation: Mobility: Walking and moving around Mobility: Walking and Moving Around Current Status ([id number](G8978): At least 40 percent but less than 60 percent impaired, limited or restricted Mobility: Walking and Moving Around Goal Status [phone]([id number]): At least 1 percent but less than 20 percent impaired, limited or restricted    Van ClinesGarrigan, Sani Loiseau [facility] Center[facility] 07/16/2016, 9:54 AM  Van ClinesHolly Twanna Resh, PT  Acute Rehabilitation Services Pager [phone] Office [phone]

## 2016-07-16 NOTE — Progress Notes (Addendum)
PROGRESS NOTE  Brandy Dudley  HOZ:[id number] DOB: 07-Jun-1951 DOA: 07/15/2016 PCP: [name], MD Outpatient Specialists:  Subjective: Patient from [facility] skilled nursing facility, will be discharge back there in a.m. Keep overnight to make sure she tolerate her diet.  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)   Anemia of chronic renal failure, stage 5 (HCC)   RUQ pain   Umbilical hernia without obstruction or gangrene   Loss of weight   Leukocytosis   Volume overload/ESRD on dialysis  -Patient presents with shortness of breath in setting of missed dialysis with clinical exam consistent with volume overload -Has had urgent dialysis yesterday with removal of 3 L of fluids -Oxygen prn and nightly at at bedtime    History of Clostridium difficile colitis/RUQ pain -Patient has been having issues with recurrent nausea and vomiting and right upper quadrant abdominal pain with weight loss and has been evaluated by outpatient GI and started on Pepcid -Had abdominal ultrasound as an outpatient-results unknown -No diarrhea, CT scan of abdomen/pelvis was done showed no colitis. -Abdominal pain resolved, not sure if this secondary to GERD. Started on diet, monitor to see if she tolerate it.    Leukocytosis -See above regarding possible colitis -Not sure what it was but probably stress de-margination, this is resolved overnight.    Obstructive sleep apnea -Per nursing home records patient does not utilize CPAP    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  -Current CBG 153 -Continue Lantus plus sliding scale insulin    Loss of weight -As above -Nutrition  consultation    Physical deconditioning -PT and OT evaluation -May benefit from some sort of boot for offloading device for right Charcot foot to aid in improving mobilization    HTN -Blood pressure currently controlled    AICD (automatic cardioverter/defibrillator) present/history of systolic heart failure -Management with regular dialysis treatments -Continue other medical therapy with carvedilol    Anemia of chronic renal failure, stage 5  -Baseline hemoglobin between 9.6 and 10.2    Umbilical hernia without obstruction or gangrene  -Hernia unremarkable on today's exam   DVT prophylaxis:  Code Status: Full Code Family Communication:  Disposition Plan:  Diet: Diet renal/carb modified with fluid restriction Diet-HS Snack? Nothing; Room service appropriate? Yes; Fluid consistency: Thin  Consultants:   None  Procedures:   None  Antimicrobials:   None   Objective: Vitals:   07/15/16 2227 07/16/16 0425 07/16/16 0817 07/16/16 0851  BP: 122/72 125/81 (!) 122/91   Pulse: 74 76 75   Resp: 20 (!) 21 19   Temp: 97.8 F (36.6 C) 98 F (36.7 C) 98.2 F (36.8 C)   TempSrc:   Oral   SpO2: 98% 98% 100% 96%  Weight: 83.9 kg (185 lb)     Height:        Intake/Output Summary (Last 24 hours) at 07/16/16 1115 Last data filed at 07/16/16 0930  Gross per 24 hour  Intake              700 ml  Output             3250 ml  Net            -  2550 ml   Filed Weights   07/15/16 1312 07/15/16 2227  Weight: 89 kg (196 lb 3.4 oz) 83.9 kg (185 lb)    Examination: General exam: Appears calm and comfortable  Respiratory system: Clear to auscultation. Respiratory effort normal. Cardiovascular system: S1 & S2 heard, RRR. No JVD, murmurs, rubs, gallops or clicks. No pedal edema. Gastrointestinal system: Abdomen is nondistended, soft and nontender. No organomegaly or masses felt. Normal bowel sounds heard. Central nervous system: Alert and oriented. No focal neurological  deficits. Extremities: Symmetric 5 x 5 power. Skin: No rashes, lesions or ulcers Psychiatry: Judgement and insight appear normal. Mood & affect appropriate.   Data Reviewed: I have personally reviewed following labs and imaging studies  CBC:  Recent Labs Lab 07/15/16 1331 07/16/16 0546  WBC 13.3* 8.9  NEUTROABS 11.0*  --   HGB 10.2* 10.0*  HCT 31.4* 31.5*  MCV 78.1 77.6*  PLT 333 220   Basic Metabolic Panel:  Recent Labs Lab 07/15/16 1800 07/16/16 0545  NA 135 135  K 4.3 4.0  CL 97* 97*  CO2 25 26  GLUCOSE 152* 230*  BUN 40* 26*  CREATININE 6.36* 4.62*  CALCIUM 8.5* 8.6*  MG 2.2  --   PHOS 4.7* 4.3   GFR: Estimated Creatinine Clearance: 12.7 mL/min (by C-G formula based on SCr of 4.62 mg/dL). Liver Function Tests:  Recent Labs Lab 07/15/16 1800 07/16/16 0545  ALBUMIN 2.0* 1.9*   No results for input(s): LIPASE, AMYLASE in the last 168 hours. No results for input(s): AMMONIA in the last 168 hours. Coagulation Profile:  Recent Labs Lab 07/15/16 1331  INR 1.85   Cardiac Enzymes: No results for input(s): CKTOTAL, CKMB, CKMBINDEX, TROPONINI in the last 168 hours. BNP (last 3 results) No results for input(s): PROBNP in the last 8760 hours. HbA1C: No results for input(s): HGBA1C in the last 72 hours. CBG:  Recent Labs Lab 07/15/16 1654 07/15/16 2231 07/16/16 0812  GLUCAP 153* 159* 206*   Lipid Profile: No results for input(s): CHOL, HDL, LDLCALC, TRIG, CHOLHDL, LDLDIRECT in the last 72 hours. Thyroid Function Tests: No results for input(s): TSH, T4TOTAL, FREET4, T3FREE, THYROIDAB in the last 72 hours. Anemia Panel: No results for input(s): VITAMINB12, FOLATE, FERRITIN, TIBC, IRON, RETICCTPCT in the last 72 hours. Urine analysis:    Component Value Date/Time   COLORURINE AMBER (A) 07/15/2016 1636   APPEARANCEUR TURBID (A) 07/15/2016 1636   LABSPEC 1.025 07/15/2016 1636   PHURINE 5.0 07/15/2016 1636   GLUCOSEU NEGATIVE 07/15/2016 1636    HGBUR MODERATE (A) 07/15/2016 1636   BILIRUBINUR MODERATE (A) 07/15/2016 1636   KETONESUR 15 (A) 07/15/2016 1636   PROTEINUR 100 (A) 07/15/2016 1636   NITRITE NEGATIVE 07/15/2016 1636   LEUKOCYTESUR MODERATE (A) 07/15/2016 1636   Sepsis Labs: @LABRCNTIP (procalcitonin:4,lacticidven:4)  ) Recent Results (from the past 240 hour(s))  MRSA PCR Screening     Status: None   Collection Time: 07/16/16 12:37 AM  Result Value Ref Range Status   MRSA by PCR NEGATIVE NEGATIVE Final    Comment:        The GeneXpert MRSA Assay (FDA approved for NASAL specimens only), is one component of a comprehensive MRSA colonization surveillance program. It is not intended to diagnose MRSA infection nor to guide or monitor treatment for MRSA infections.      Invalid input(s): PROCALCITONIN, LACTICACIDVEN   Radiology Studies: Ct Abdomen Pelvis Wo Contrast  Result Date: 07/16/2016 CLINICAL DATA:  Right upper quadrant pain and nausea EXAM: CT ABDOMEN  AND PELVIS WITHOUT CONTRAST TECHNIQUE: Multidetector CT imaging of the abdomen and pelvis was performed following the standard protocol without IV contrast. COMPARISON:  CT abdomen/ pelvis 07/06/2016 FINDINGS: The examination is degraded by motion and the lack of intravenous contrast. Lower chest: There is massive cardiomegaly. Moderate right and small left pleural effusions. No pulmonary nodules are identified. There is a calcified granuloma in the left lung base. Bilateral basilar atelectasis. Left femoral approach dialysis catheter terminates beyond the field of view. Hepatobiliary: The liver is enlarged with small volume perihepatic ascites. No liver lesions identified on this noncontrast study. Gallbladder appears normal. Pancreas: Normal unenhanced appearance of the pancreas. No pancreatic ductal dilatation. No peripancreatic fluid collection. Spleen: Normal spleen. Adrenals/Urinary Tract: Adrenal glands are normal. No hydronephrosis. No nephrolithiasis. Renal  contours are normal. Urinary bladder is incompletely distended. Stomach/Bowel: No dilated small bowel or other evidence of obstruction. No enteric or colonic inflammation. The appendix is not clearly visualized. The stomach is moderately distended. Fat-containing ventral abdominal hernia. Vascular/Lymphatic: There is atherosclerotic calcification within the abdominal aorta and its branch vessels. There is a left femoral vein approach central venous catheter that terminates beyond the field of view. Reproductive: There is fluid within the endometrial cavity. The ovaries are unremarkable. Other: Diffuse anasarca. Musculoskeletal: Multilevel facet arthrosis and osteophytosis. IMPRESSION: 1. No acute intra-abdominal process. No evidence of pseudomembranous colitis. 2. Hepatomegaly small volume perihepatic ascites. 3. Massive cardiomegaly with bilateral pleural effusions, right-greater-than-left, which worsened prior study. 4. Aortic atherosclerosis. Electronically Signed   By: Deatra Robinson M.D.   On: 07/16/2016 04:16   Dg Chest Port 1 View  Result Date: 07/15/2016 CLINICAL DATA:  Shortness of Breath EXAM: PORTABLE CHEST 1 VIEW COMPARISON:  March 23, 2016 FINDINGS: There is no edema or consolidation. There is cardiomegaly with pulmonary vascularity within normal limits. Pacemaker lead is attached to the right ventricle. There is atherosclerotic calcification in the aorta. No adenopathy. There is degenerative change in each shoulder. IMPRESSION: Cardiomegaly.  Aortic atherosclerosis.  No edema or consolidation. Electronically Signed   By: Bretta Bang III M.D.   On: 07/15/2016 14:34        Scheduled Meds: . atorvastatin  10 mg Oral QHS  . budesonide  0.25 mg Nebulization BID  . carvedilol  3.125 mg Oral BID WC  . famotidine  20 mg Oral Daily  . heparin  5,000 Units Subcutaneous Q8H  . insulin aspart  0-5 Units Subcutaneous QHS  . insulin aspart  0-9 Units Subcutaneous TID WC  . insulin glargine   10 Units Subcutaneous QHS  . mirtazapine  15 mg Oral QHS  . multivitamin with minerals  1 tablet Oral QHS  . saccharomyces boulardii  250 mg Oral BID  . sertraline  25 mg Oral Daily  . sodium chloride flush  3 mL Intravenous Q12H   Continuous Infusions:    LOS: 0 days    Time spent: 35 minutes    Desaree Downen A, MD [facility] Pager [phone]  If 7PM-7AM, please contact night-coverage [email or web address] Password [email or web address] 07/16/2016, 11:15 AM

## 2016-07-16 NOTE — Progress Notes (Signed)
New Admission Note:  Arrival Method: Stretcher from HD. Mental Orientation: A&O x4 Telemetry: None Assessment: Completed Skin: Completed with Sonny Masters, RN, check flowsheets IV: R thumb, clean, dry and intact Pain: Patient denies pain Safety Measures: Safety Fall Prevention Plan discussed with patient Admission: Completed [address] Orientation: Patient has been orientated to the room, unit and the staff. Orders have been reviewed and implemented. Will continue to monitor the patient. Call light has been placed within reach and bed alarm has been activated.   Rivka Barbara BSN, RN  Phone Number: [phone]

## 2016-07-17 DIAGNOSIS — Z86718 Personal history of other venous thrombosis and embolism: Secondary | ICD-10-CM | POA: Diagnosis not present

## 2016-07-17 DIAGNOSIS — E11610 Type 2 diabetes mellitus with diabetic neuropathic arthropathy: Secondary | ICD-10-CM | POA: Diagnosis present

## 2016-07-17 DIAGNOSIS — N185 Chronic kidney disease, stage 5: Secondary | ICD-10-CM | POA: Diagnosis not present

## 2016-07-17 DIAGNOSIS — I959 Hypotension, unspecified: Secondary | ICD-10-CM | POA: Diagnosis present

## 2016-07-17 DIAGNOSIS — K429 Umbilical hernia without obstruction or gangrene: Secondary | ICD-10-CM | POA: Diagnosis present

## 2016-07-17 DIAGNOSIS — Z79899 Other long term (current) drug therapy: Secondary | ICD-10-CM | POA: Diagnosis not present

## 2016-07-17 DIAGNOSIS — R109 Unspecified abdominal pain: Secondary | ICD-10-CM

## 2016-07-17 DIAGNOSIS — E8770 Fluid overload, unspecified: Secondary | ICD-10-CM | POA: Diagnosis present

## 2016-07-17 DIAGNOSIS — R0602 Shortness of breath: Secondary | ICD-10-CM | POA: Diagnosis present

## 2016-07-17 DIAGNOSIS — I2510 Atherosclerotic heart disease of native coronary artery without angina pectoris: Secondary | ICD-10-CM | POA: Diagnosis present

## 2016-07-17 DIAGNOSIS — I70209 Unspecified atherosclerosis of native arteries of extremities, unspecified extremity: Secondary | ICD-10-CM | POA: Diagnosis not present

## 2016-07-17 DIAGNOSIS — N186 End stage renal disease: Secondary | ICD-10-CM

## 2016-07-17 DIAGNOSIS — E1122 Type 2 diabetes mellitus with diabetic chronic kidney disease: Secondary | ICD-10-CM | POA: Diagnosis present

## 2016-07-17 DIAGNOSIS — R7881 Bacteremia: Secondary | ICD-10-CM | POA: Diagnosis present

## 2016-07-17 DIAGNOSIS — E8779 Other fluid overload: Secondary | ICD-10-CM | POA: Diagnosis not present

## 2016-07-17 DIAGNOSIS — I951 Orthostatic hypotension: Secondary | ICD-10-CM | POA: Diagnosis not present

## 2016-07-17 DIAGNOSIS — Z87891 Personal history of nicotine dependence: Secondary | ICD-10-CM | POA: Diagnosis not present

## 2016-07-17 DIAGNOSIS — E43 Unspecified severe protein-calorie malnutrition: Secondary | ICD-10-CM | POA: Diagnosis present

## 2016-07-17 DIAGNOSIS — Z8719 Personal history of other diseases of the digestive system: Secondary | ICD-10-CM

## 2016-07-17 DIAGNOSIS — I132 Hypertensive heart and chronic kidney disease with heart failure and with stage 5 chronic kidney disease, or end stage renal disease: Secondary | ICD-10-CM | POA: Diagnosis present

## 2016-07-17 DIAGNOSIS — E669 Obesity, unspecified: Secondary | ICD-10-CM | POA: Diagnosis present

## 2016-07-17 DIAGNOSIS — G4733 Obstructive sleep apnea (adult) (pediatric): Secondary | ICD-10-CM | POA: Diagnosis present

## 2016-07-17 DIAGNOSIS — Z794 Long term (current) use of insulin: Secondary | ICD-10-CM | POA: Diagnosis not present

## 2016-07-17 DIAGNOSIS — J449 Chronic obstructive pulmonary disease, unspecified: Secondary | ICD-10-CM | POA: Diagnosis present

## 2016-07-17 DIAGNOSIS — Z01810 Encounter for preprocedural cardiovascular examination: Secondary | ICD-10-CM | POA: Diagnosis not present

## 2016-07-17 DIAGNOSIS — I5042 Chronic combined systolic (congestive) and diastolic (congestive) heart failure: Secondary | ICD-10-CM | POA: Diagnosis present

## 2016-07-17 DIAGNOSIS — Z992 Dependence on renal dialysis: Secondary | ICD-10-CM | POA: Diagnosis not present

## 2016-07-17 DIAGNOSIS — R188 Other ascites: Secondary | ICD-10-CM | POA: Diagnosis present

## 2016-07-17 DIAGNOSIS — B965 Pseudomonas (aeruginosa) (mallei) (pseudomallei) as the cause of diseases classified elsewhere: Secondary | ICD-10-CM | POA: Diagnosis present

## 2016-07-17 DIAGNOSIS — N2581 Secondary hyperparathyroidism of renal origin: Secondary | ICD-10-CM | POA: Diagnosis present

## 2016-07-17 DIAGNOSIS — D631 Anemia in chronic kidney disease: Secondary | ICD-10-CM | POA: Diagnosis present

## 2016-07-17 DIAGNOSIS — Z95810 Presence of automatic (implantable) cardiac defibrillator: Secondary | ICD-10-CM | POA: Diagnosis not present

## 2016-07-17 LAB — RENAL FUNCTION PANEL
Albumin: 1.9 g/dL — ABNORMAL LOW (ref 3.5–5.0)
Anion gap: 7 (ref 5–15)
BUN: 40 mg/dL — ABNORMAL HIGH (ref 6–20)
CO2: 29 mmol/L (ref 22–32)
Calcium: 8.6 mg/dL — ABNORMAL LOW (ref 8.9–10.3)
Chloride: 100 mmol/L — ABNORMAL LOW (ref 101–111)
Creatinine, Ser: 5.64 mg/dL — ABNORMAL HIGH (ref 0.44–1.00)
GFR calc Af Amer: 8 mL/min — ABNORMAL LOW (ref >60)
GFR calc non Af Amer: 7 mL/min — ABNORMAL LOW (ref >60)
Glucose, Bld: 124 mg/dL — ABNORMAL HIGH (ref 65–99)
Phosphorus: 4.3 mg/dL (ref 2.5–4.6)
Potassium: 4.1 mmol/L (ref 3.5–5.1)
Sodium: 136 mmol/L (ref 135–145)

## 2016-07-17 LAB — CBC
HCT: 31.7 % — ABNORMAL LOW (ref 36.0–46.0)
Hemoglobin: 10.1 g/dL — ABNORMAL LOW (ref 12.0–15.0)
MCH: 24.8 pg — ABNORMAL LOW (ref 26.0–34.0)
MCHC: 31.9 g/dL (ref 30.0–36.0)
MCV: 77.9 fL — ABNORMAL LOW (ref 78.0–100.0)
Platelets: 223 10*3/uL (ref 150–400)
RBC: 4.07 MIL/uL (ref 3.87–5.11)
RDW: 18.6 % — ABNORMAL HIGH (ref 11.5–15.5)
WBC: 9.8 10*3/uL (ref 4.0–10.5)

## 2016-07-17 LAB — BLOOD CULTURE ID PANEL (REFLEXED)
Acinetobacter baumannii: NOT DETECTED
CANDIDA GLABRATA: NOT DETECTED
CANDIDA KRUSEI: NOT DETECTED
CANDIDA PARAPSILOSIS: NOT DETECTED
Candida albicans: NOT DETECTED
Candida tropicalis: NOT DETECTED
Carbapenem resistance: NOT DETECTED
ENTEROBACTER CLOACAE COMPLEX: NOT DETECTED
ENTEROBACTERIACEAE SPECIES: NOT DETECTED
ENTEROCOCCUS SPECIES: NOT DETECTED
Escherichia coli: NOT DETECTED
Haemophilus influenzae: NOT DETECTED
KLEBSIELLA OXYTOCA: NOT DETECTED
KLEBSIELLA PNEUMONIAE: NOT DETECTED
LISTERIA MONOCYTOGENES: NOT DETECTED
Methicillin resistance: NOT DETECTED
Neisseria meningitidis: NOT DETECTED
PROTEUS SPECIES: NOT DETECTED
Pseudomonas aeruginosa: DETECTED — AB
STAPHYLOCOCCUS SPECIES: NOT DETECTED
Serratia marcescens: NOT DETECTED
Staphylococcus aureus (BCID): NOT DETECTED
Streptococcus agalactiae: NOT DETECTED
Streptococcus pneumoniae: NOT DETECTED
Streptococcus pyogenes: NOT DETECTED
Streptococcus species: NOT DETECTED
Vancomycin resistance: NOT DETECTED

## 2016-07-17 LAB — GLUCOSE, CAPILLARY
GLUCOSE-CAPILLARY: 152 mg/dL — AB (ref 65–99)
GLUCOSE-CAPILLARY: 110 mg/dL — AB (ref 65–99)
GLUCOSE-CAPILLARY: 127 mg/dL — AB (ref 65–99)
Glucose-Capillary: 119 mg/dL — ABNORMAL HIGH (ref 65–99)

## 2016-07-17 MED ORDER — VANCOMYCIN 50 MG/ML ORAL SOLUTION
125.0000 mg | Freq: Four times a day (QID) | ORAL | Status: DC
  Administered 2016-07-17 – 2016-07-22 (×18): 125 mg via ORAL
  Filled 2016-07-17 (×22): qty 2.5

## 2016-07-17 MED ORDER — DEXTROSE 5 % IV SOLN
2.0000 g | Freq: Once | INTRAVENOUS | Status: AC
  Administered 2016-07-17: 2 g via INTRAVENOUS
  Filled 2016-07-17: qty 2

## 2016-07-17 MED ORDER — DEXTROSE 5 % IV SOLN
2.0000 g | INTRAVENOUS | Status: DC
  Filled 2016-07-17: qty 2

## 2016-07-17 MED ORDER — LIDOCAINE HCL (PF) 1 % IJ SOLN: 10.0000 mL | Freq: Once | INTRAMUSCULAR | Status: DC

## 2016-07-17 MED ORDER — LIDOCAINE HCL 1 % IJ SOLN
10.0000 mL | Freq: Once | INTRAMUSCULAR | Status: DC
  Filled 2016-07-17: qty 10

## 2016-07-17 MED ORDER — DARBEPOETIN ALFA 150 MCG/0.3ML IJ SOSY
150.0000 ug | PREFILLED_SYRINGE | INTRAMUSCULAR | Status: AC
  Administered 2016-07-17: 150 ug via INTRAVENOUS
  Filled 2016-07-17: qty 0.3

## 2016-07-17 MED ORDER — BOOST PLUS PO LIQD
237.0000 mL | Freq: Every day | ORAL | Status: DC
  Administered 2016-07-18 – 2016-07-21 (×2): 237 mL via ORAL
  Filled 2016-07-17 (×7): qty 237

## 2016-07-17 MED ORDER — FAMOTIDINE 20 MG PO TABS
20.0000 mg | ORAL_TABLET | Freq: Every day | ORAL | Status: DC
  Administered 2016-07-18 – 2016-07-21 (×4): 20 mg via ORAL
  Filled 2016-07-17 (×4): qty 1

## 2016-07-17 MED ORDER — DARBEPOETIN ALFA 150 MCG/0.3ML IJ SOSY
PREFILLED_SYRINGE | INTRAMUSCULAR | Status: AC
  Filled 2016-07-17: qty 0.3

## 2016-07-17 NOTE — Consult Note (Signed)
[facility]    Date of Admission:  07/15/2016           Day 2 cefepime       Reason for Consult: Positive blood culture growing Pseudomonas and history of recent C. difficile colitis   Referring Physician: Dr. 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 atorvastatin  10 mg Oral QHS  . budesonide  0.25 mg Nebulization BID  . carvedilol  3.125 mg Oral BID WC  . [START ON 07/18/2016] ceFEPime (MAXIPIME) IV  2 g Intravenous Q T,Th,Sat-1800  . darbepoetin (ARANESP) injection - DIALYSIS  150 mcg Intravenous Q Mon-HD  . [START ON 07/18/2016] famotidine  20 mg Oral QHS  . heparin  5,000 Units Subcutaneous Q8H  . insulin aspart  0-5 Units Subcutaneous QHS  . insulin aspart  0-9 Units Subcutaneous TID WC  . insulin glargine  10 Units Subcutaneous QHS  . lactose free nutrition  237 mL Oral Q1500  . lidocaine (PF)  10 mL Intradermal Once  . mirtazapine  15 mg Oral QHS  . multivitamin with minerals  1 tablet Oral QHS  . saccharomyces boulardii  250 mg Oral BID  . sertraline  25 mg Oral Daily  . sodium chloride flush  3 mL Intravenous Q12H     Recommendations: 1. Continue cefepime for now 2. Start oral vancomycin   Assessment: I'm not sure what is causing Ms. Radermacher's abdominal pain and hypotension. So far, she is not having any recurrent diarrhea but she is certainly at risk for relapse of C. difficile colitis. I'm concerned that she could also have ischemic colitis with pain. The one positive blood culture drawn from her left femoral hemodialysis catheter probably represents an insignificant contaminant but since she is hypotensive I will continue cefepime. It appears the plan is to remove the catheter after hemodialysis today. I will start her back on oral vancomycin in an effort to try to prevent a relapse of C. difficile colitis.   HPI: Brandy Dudley is a 65 y.o. female with end-stage renal disease on hemodialysis and multiple other medical problems who developed C. difficile colitis in April. She was treated with oral vancomycin and improved but relapsed in June. She was treated with vancomycin again. Several days ago she began to develop abdominal pain. She has not had any recent diarrhea. She has had some nausea without vomiting. On occasion the pain will worsen after eating. She has been afebrile with a normal white blood count but hypotensive so blood cultures were done on admission. One blood culture drawn through her hemodialysis catheter has grown Pseudomonas aeruginosa. A peripheral stick blood culture is  negative.   Review of Systems: Review of Systems  Constitutional: Positive for malaise/fatigue and weight loss. Negative for chills, diaphoresis and fever.  HENT: Negative for sore throat.   Respiratory: Positive for shortness of breath. Negative for cough and sputum production.   Cardiovascular: Negative for chest pain.  Gastrointestinal: Positive for abdominal pain and nausea. Negative for blood in stool, constipation, diarrhea, heartburn and vomiting.  Genitourinary: Negative for dysuria and frequency.   Musculoskeletal: Negative for joint pain and myalgias.  Skin: Negative for rash.  Neurological: Negative for headaches.    Past Medical History:  Diagnosis Date  . AICD (automatic cardioverter/defibrillator) present   . Anemia   . C. difficile colitis   . CHF (congestive heart failure) (HCC)    systolic  . COPD (chronic obstructive pulmonary disease) (HCC)   . Coronary artery disease   . Diabetes mellitus (HCC)   . DVT (deep venous thrombosis) (HCC)    on coumadin  . ESRD (end stage renal disease) (HCC)    tues/thurs/sat dialysis  . Hypertension   . Obstructive sleep apnea    no cpap  . Renal disorder     Social History  Substance Use Topics  . Smoking status: Former Smoker    Types: Cigarettes    Quit date: 03/03/2006  . Smokeless tobacco: Never Used  . Alcohol use No    No family history on file. Allergies  Allergen Reactions  . Entresto [Sacubitril-Valsartan] Other (See Comments)     unknown reaction  . Sulfa Antibiotics Itching    OBJECTIVE: Blood pressure (!) 74/48, pulse 70, temperature 98.3 F (36.8 C), temperature source Oral, resp. rate 18, height 5\' 4"  (1.626 m), weight 184 lb 11.9 oz (83.8 kg), SpO2 98 %.  Physical Exam  Constitutional: She is oriented to person, place, and time.  She is currently on hemodialysis. She appears somewhat uncomfortable due to abdominal pain.  HENT:  Mouth/Throat: No oropharyngeal exudate.  Eyes: Conjunctivae are normal.  Neck: Neck supple.  Cardiovascular: Normal rate and regular rhythm.   No murmur heard. Pulmonary/Chest: Effort normal and breath sounds normal. She has no wheezes. She has no rales.  Abdominal: Soft. She exhibits no distension and no mass. There is tenderness.  Musculoskeletal: Normal range of motion. She exhibits no edema or tenderness.  Left arm aVF with faint thrill. No signs of infection. Left femoral hemodialysis catheter in place.  Neurological: She is alert and oriented to person, place, and  time.  Skin: No rash noted.  Psychiatric: Mood and affect normal.    Lab Results Lab Results  Component Value Date   WBC 9.8 07/17/2016   HGB 10.1 (L) 07/17/2016   HCT 31.7 (L) 07/17/2016   MCV 77.9 (L) 07/17/2016   PLT 223 07/17/2016    Lab Results  Component Value Date   CREATININE 5.64 (H) 07/17/2016   BUN 40 (H) 07/17/2016   NA 136 07/17/2016   K 4.1 07/17/2016   CL 100 (L) 07/17/2016   CO2 29 07/17/2016    Lab Results  Component Value Date   ALT 8 06/01/2016   AST 15 06/01/2016   ALKPHOS 234 (A) 06/01/2016   BILITOT 1.5 (H) 05/06/2016     Microbiology: Recent Results (from the past 240 hour(s))  Urine culture     Status: None   Collection Time: 07/15/16  4:36 PM  Result Value Ref Range Status   Specimen Description URINE, CATHETERIZED  Final   Special Requests NONE  Final   Culture  NO GROWTH  Final   Report Status 07/16/2016 FINAL  Final  Culture, blood (Routine X 2) w Reflex to ID Panel     Status: Abnormal (Preliminary result)   Collection Time: 07/15/16  6:20 PM  Result Value Ref Range Status   Specimen Description BLOOD VENOUS CATHETER  Final   Special Requests BOTTLES DRAWN AEROBIC AND ANAEROBIC 5CC  Final   Culture  Setup Time   Final    AEROBIC BOTTLE ONLY GRAM NEGATIVE RODS CRITICAL RESULT CALLED TO, READ BACK BY AND VERIFIED WITH: TO JLEDGFORD(PHARD) BY TCLEVELAND 07/17/2016 AT 3:29    Culture (A)  Final    PSEUDOMONAS AERUGINOSA CULTURE REINCUBATED FOR BETTER GROWTH    Report Status PENDING  Incomplete  Culture, blood (Routine X 2) w Reflex to ID Panel     Status: None (Preliminary result)   Collection Time: 07/15/16  6:20 PM  Result Value Ref Range Status   Specimen Description BLOOD ARTERIAL CATHETER  Final   Special Requests BOTTLES DRAWN AEROBIC AND ANAEROBIC 5CC  Final   Culture NO GROWTH 2 DAYS  Final   Report Status PENDING  Incomplete  Blood Culture ID Panel (Reflexed)     Status: Abnormal   Collection Time: 07/15/16  6:20 PM   Result Value Ref Range Status   Enterococcus species NOT DETECTED NOT DETECTED Final   Vancomycin resistance NOT DETECTED NOT DETECTED Final   Listeria monocytogenes NOT DETECTED NOT DETECTED Final   Staphylococcus species NOT DETECTED NOT DETECTED Final   Staphylococcus aureus NOT DETECTED NOT DETECTED Final   Methicillin resistance NOT DETECTED NOT DETECTED Final   Streptococcus species NOT DETECTED NOT DETECTED Final   Streptococcus agalactiae NOT DETECTED NOT DETECTED Final   Streptococcus pneumoniae NOT DETECTED NOT DETECTED Final   Streptococcus pyogenes NOT DETECTED NOT DETECTED Final   Acinetobacter baumannii NOT DETECTED NOT DETECTED Final   Enterobacteriaceae species NOT DETECTED NOT DETECTED Final   Enterobacter cloacae complex NOT DETECTED NOT DETECTED Final   Escherichia coli NOT DETECTED NOT DETECTED Final   Klebsiella oxytoca NOT DETECTED NOT DETECTED Final   Klebsiella pneumoniae NOT DETECTED NOT DETECTED Final   Proteus species NOT DETECTED NOT DETECTED Final   Serratia marcescens NOT DETECTED NOT DETECTED Final   Carbapenem resistance NOT DETECTED NOT DETECTED Final   Haemophilus influenzae NOT DETECTED NOT DETECTED Final   Neisseria meningitidis NOT DETECTED NOT DETECTED Final   Pseudomonas aeruginosa DETECTED (A) NOT DETECTED Final    Comment: CRITICAL RESULT CALLED TO, READ BACK BY AND VERIFIED WITH: TO JLEDGFORD(PHARD) BY TCLEVELAND 07/17/2016 AT 3:29    Candida albicans NOT DETECTED NOT DETECTED Final   Candida glabrata NOT DETECTED NOT DETECTED Final   Candida krusei NOT DETECTED NOT DETECTED Final   Candida parapsilosis NOT DETECTED NOT DETECTED Final   Candida tropicalis NOT DETECTED NOT DETECTED Final  MRSA PCR Screening     Status: None   Collection Time: 07/16/16 12:37 AM  Result Value Ref Range Status   MRSA by PCR NEGATIVE NEGATIVE Final    Comment:        The GeneXpert MRSA Assay (FDA approved for NASAL specimens only), is one component of  a comprehensive MRSA colonization surveillance program. It is not intended to diagnose MRSA infection nor to guide or monitor treatment for MRSA infections.     Cliffton AstersJohn Javarri Segal, MD [facility] [phone] pager   [phone] 07/17/2016, 4:01 PM

## 2016-07-17 NOTE — Progress Notes (Signed)
PHARMACY - PHYSICIAN COMMUNICATION CRITICAL VALUE ALERT - BLOOD CULTURE IDENTIFICATION (BCID)  Results for orders placed or performed during the hospital encounter of 07/15/16  Blood Culture ID Panel (Reflexed) (Collected: 07/15/2016  6:20 PM)  Result Value Ref Range   Enterococcus species NOT DETECTED NOT DETECTED   Vancomycin resistance NOT DETECTED NOT DETECTED   Listeria monocytogenes NOT DETECTED NOT DETECTED   Staphylococcus species NOT DETECTED NOT DETECTED   Staphylococcus aureus NOT DETECTED NOT DETECTED   Methicillin resistance NOT DETECTED NOT DETECTED   Streptococcus species NOT DETECTED NOT DETECTED   Streptococcus agalactiae NOT DETECTED NOT DETECTED   Streptococcus pneumoniae NOT DETECTED NOT DETECTED   Streptococcus pyogenes NOT DETECTED NOT DETECTED   Acinetobacter baumannii NOT DETECTED NOT DETECTED   Enterobacteriaceae species NOT DETECTED NOT DETECTED   Enterobacter cloacae complex NOT DETECTED NOT DETECTED   Escherichia coli NOT DETECTED NOT DETECTED   Klebsiella oxytoca NOT DETECTED NOT DETECTED   Klebsiella pneumoniae NOT DETECTED NOT DETECTED   Proteus species NOT DETECTED NOT DETECTED   Serratia marcescens NOT DETECTED NOT DETECTED   Carbapenem resistance NOT DETECTED NOT DETECTED   Haemophilus influenzae NOT DETECTED NOT DETECTED   Neisseria meningitidis NOT DETECTED NOT DETECTED   Pseudomonas aeruginosa DETECTED (A) NOT DETECTED   Candida albicans NOT DETECTED NOT DETECTED   Candida glabrata NOT DETECTED NOT DETECTED   Candida krusei NOT DETECTED NOT DETECTED   Candida parapsilosis NOT DETECTED NOT DETECTED   Candida tropicalis NOT DETECTED NOT DETECTED    Name of physician (or Provider) Contacted: L. Easterwood ([facility])  Changes to prescribed antibiotics required: Add Cefepime  Abran Duke 07/17/2016  3:33 AM

## 2016-07-17 NOTE — Progress Notes (Signed)
   07/17/16 1654  Clinical Encounter Type  Visited With Patient;Health care provider  Visit Type Initial;Spiritual support  Referral From Nurse;Patient  Spiritual Encounters  Spiritual Needs Prayer;Emotional;Sacred text  Stress Factors  Patient Stress Factors Health changes   Chaplain responded to a consult. Patient stated that she is feeling scared, although had a hard time specifying what she is scared of. Indicated that 'God has brought her through a lot,' and felt like 'this is a battle I can't win.' Chaplain reviewed sacred texts, and chaplain offered prayer and support. Chaplain introduced spiritual care services. Spiritual care services available as needed.   Alda Ponder, Chaplain 07/17/16 4:56 PM

## 2016-07-17 NOTE — Progress Notes (Signed)
Initial Nutrition Assessment  DOCUMENTATION CODES:   Severe malnutrition in context of chronic illness, Obesity unspecified  INTERVENTION:  Provide Boost Plus po once daily, each supplement provides 360 kcal and 14 grams of protein.   Encourage adequate PO intake.   NUTRITION DIAGNOSIS:   Malnutrition related to chronic illness as evidenced by percent weight loss, energy intake < or equal to 75% for > or equal to 1 month.  GOAL:   Patient will meet greater than or equal to 90% of their needs  MONITOR:   PO intake, Supplement acceptance, Labs, Weight trends, Skin, I & O's  REASON FOR ASSESSMENT:   Consult Assessment of nutrition requirement/status  ASSESSMENT:   65 y.o. female with medical history significant for chronic kidney disease on dialysis, history of recurrent C. Difficile colitis (initial treatment April 2017 with second treatment June 2017), umbilical hernia, OSA, diabetes, AICD in place, physical deconditioning, anemia of chronic kidney disease, Charcot foot who presented to the ER after developing shortness of breath during dialysis treatment and inability to tolerate outpatient dialysis treatment   Pt reports having a lack of appetite due to abdominal pains. Meal completion has been 75-100%. Pt reports poor po intake PTA with usual consumption of at least 1 meal a day which has been ongoing over 2 months. Pt reports usual n/v post prandial. Pt reports weight has been fluctuating largely related to fluid status however does report usual body weight of ~240 lbs. Pt with a 17.8% weight loss in 3 months. Pt reports she is planning to consuming Boost Shakes post discharge to aid in caloric and protein needs. Pt reports having tried Nepro Shake in the past is dislikes them. RD to order Boost. Pt encouraged to eat her food at meals.   Pt with no observed significant fat or muscle mass loss.   Labs and medications reviewed.   Diet Order:  Diet renal/carb modified with fluid  restriction Diet-HS Snack? Nothing; Room service appropriate? Yes; Fluid consistency: Thin  Skin:  Reviewed, no issues  Last BM:  8/20  Height:   Ht Readings from Last 1 Encounters:  07/15/16 5\' 4"  (1.626 m)    Weight:   Wt Readings from Last 1 Encounters:  07/17/16 189 lb 6.4 oz (85.9 kg)   Wt Readings from Last 50 Encounters:  07/17/16 189 lb 6.4 oz (85.9 kg)  07/13/16 195 lb 3.2 oz (88.5 kg)  07/05/16 202 lb 6.4 oz (91.8 kg)  06/21/16 206 lb (93.4 kg)  06/16/16 206 lb (93.4 kg)  06/13/16 206 lb (93.4 kg)  06/07/16 217 lb (98.4 kg)  05/31/16 217 lb (98.4 kg)  05/15/16 217 lb (98.4 kg)  05/12/16 205 lb 14.6 oz (93.4 kg)  04/17/16 226 lb 9.6 oz (102.8 kg)  04/14/16 230 lb (104.3 kg)  04/08/16 230 lb 13.2 oz (104.7 kg)  03/20/16 282 lb (127.9 kg)  03/16/16 282 lb (127.9 kg)  03/14/16 258 lb 6.4 oz (117.2 kg)  03/03/16 260 lb (117.9 kg)    Ideal Body Weight:  54.5 kg  BMI:  Body mass index is 32.51 kg/m.  Estimated Nutritional Needs:   Kcal:  1800-2050  Protein:  95-105 grams  Fluid:  1.2 L/day  EDUCATION NEEDS:   Education needs addressed  Roslyn Smiling, MS, RD, LDN Pager # [phone] After hours/ weekend pager # [phone]

## 2016-07-17 NOTE — Evaluation (Signed)
Occupational Therapy Evaluation Patient Details Name: Brandy Dudley MRN: [id number] DOB: September 20, 1951 Today's Date: 07/17/2016    History of Present Illness Brandy Dudley is a 65 y.o. female with medical history significant for chronic kidney disease on dialysis, history of recurrent C. Difficile colitis (initial treatment April 2017 with second treatment June 2017), umbilical hernia, OSA, diabetes, AICD in place, physical deconditioning, anemia of chronic kidney disease, Charcot foot who presented to the ER after developing shortness of breath during dialysis treatment and inability to tolerate outpatient dialysis treatment due to same.   Clinical Impression   Pt with decline in function and safety with ADLs and ADL mobility with decreased strength, balance and endurance. Eval limited due to pt becoming nauseous during bed mobility to EOB. Pt returned to supine position and began vomiting. Pt's nurse notified. Pt at SNF PTA and plans to return to SNF for therapy    Follow Up Recommendations  Supervision/Assistance - 24 hour;SNF    Equipment Recommendations  None recommended by OT    Recommendations for Other Services       Precautions / Restrictions Precautions Precautions: Fall Restrictions Weight Bearing Restrictions: No      Mobility Bed Mobility Overal bed mobility: Needs Assistance Bed Mobility: Supine to Sit     Supine to sit: Mod assist     General bed mobility comments: during bed mobility pt stated that she felt nauseous and requested to return to supine. Pt reopsitiined in supine and began to vomit  Transfers                 General transfer comment: unable to attempt due to N/V    Balance     Sitting balance-Leahy Scale: Fair                                      ADL Overall ADL's : Needs assistance/impaired     Grooming: Wash/dry hands;Wash/dry face;Bed level;Set up   Upper Body Bathing: Minimal assitance;Bed level   Lower Body  Bathing: Maximal assistance;Bed level   Upper Body Dressing : Minimal assistance;Bed level   Lower Body Dressing: Total assistance;Bed level   Toilet Transfer:  (unable to attempt due to N/V) Toilet Transfer Details (indicate cue type and reason): per PT note, pt is mod A with transfers         Functional mobility during ADLs: Moderate assistance General ADL Comments: Pt unable to sit EOB for ADLs due to N/V during bed mobility to EOB     Vision  no change from baseline              Pertinent Vitals/Pain Pain Assessment: No/denies pain     Hand Dominance Right   Extremity/Trunk Assessment Upper Extremity Assessment Upper Extremity Assessment: Generalized weakness   Lower Extremity Assessment Lower Extremity Assessment: Defer to PT evaluation       Communication Communication Communication: No difficulties   Cognition Arousal/Alertness: Awake/alert Behavior During Therapy: WFL for tasks assessed/performed Overall Cognitive Status: Within Functional Limits for tasks assessed                     General Comments   pt pleasant and cooperative                 Home Living Family/patient expects to be discharged to:: Skilled nursing facility  Additional Comments[facility]      Prior Functioning/Environment Level of Independence: Needs assistance  Gait / Transfers Assistance Needed: SPC at home and RW more recently ADL's / Homemaking Assistance Needed: was living alone and doing her own housework   Comments: Was managing as above until her last admission approx 2 months ago; since then has been at [facility] re[facility]    OT Diagnosis: Generalized weakness   OT Problem List: Decreased strength;Impaired balance (sitting and/or standing);Decreased activity tolerance;Decreased knowledge of use of DME or AE   OT Treatment/Interventions: Self-care/ADL training;DME and/or AE  instruction;Therapeutic activities;Patient/family education    OT Goals(Current goals can be found in the care plan section) Acute Rehab OT Goals Patient Stated Goal: wants to get stronger OT Goal Formulation: With patient Time For Goal Achievement: 07/24/16 Potential to Achieve Goals: Good ADL Goals Pt Will Perform Grooming: with min guard assist;sitting (EOB) Pt Will Perform Upper Body Bathing: with min guard assist;sitting (EOB) Pt Will Perform Lower Body Bathing: with mod assist;sitting/lateral leans;sit to/from stand Pt Will Perform Upper Body Dressing: with min guard assist;sitting (EOB) Pt Will Transfer to Toilet: with mod assist;with min assist;bedside commode  OT Frequency: Min 2X/week   Barriers to D/C: Decreased caregiver support  pt was at [facility] PTA                     End of Session Nurse Communication: Other (comment) (pt vomiting)  Activity Tolerance: Other (comment) (N/V) Patient left: in bed;with call Luther/phone within reach   Time: 1145-1208 OT Time Calculation (min): 23 min Charges:  OT General Charges $OT Visit: 1 Procedure OT Evaluation $OT Eval Moderate Complexity: 1 Procedure OT Treatments $Therapeutic Activity: 8-22 mins G-Codes: OT G-codes **NOT FOR INPATIENT CLASS** Functional Assessment Tool Used: clinical judgement Functional Limitation: Self care Self Care Current Status ([id number]): At least 60 percent but less than 80 percent impaired, limited or restricted Self Care Goal Status ([id number](G8988): At least 40 percent but less than 60 percent impaired, limited or restricted  Galen ManilaSpencer, Shaterica Mcclatchy Jeanette 07/17/2016, 2:54 PM

## 2016-07-17 NOTE — Progress Notes (Signed)
Pt with  + BC pseudomonas - has left groin TDC  And maturing AVF - very nauseated this am. Plan HD today off schedule and hopefully get HD out this pm.  Continue to decrease volume. Dr. Randie Heinz to see and also evaluate AVF - she had ligation of branch 7/21.  AVF initially placed in May. Please advise if full admission and we will consult.  [facility] : [facility]  4h  89kg  3/2.25 bath  400500   Hep 6000   L fem cath/ maturing LUA AVF  Bard Herbert, PA-C  Pt seen, examined and agree w A/P as above.  Vinson Moselle MD [employer] pager [phone]    cell [phone] 07/17/2016, 1:22 PM

## 2016-07-17 NOTE — Progress Notes (Signed)
Pharmacist Heart Failure Core Measure Documentation  Assessment: Brandy Dudley has an EF documented as 35-40%.  Rationale: Heart failure patients with left ventricular systolic dysfunction (LVSD) and an EF < 40% should be prescribed an angiotensin converting enzyme inhibitor (ACEI) or angiotensin receptor blocker (ARB) at discharge unless a contraindication is documented in the medical record.  This patient is not currently on an ACEI or ARB for HF.  This note is being placed in the record in order to provide documentation that a contraindication to the use of these agents is present for this encounter.  ACE Inhibitor or Angiotensin Receptor Blocker is contraindicated (specify all that apply)  []   ACEI allergy AND ARB allergy []   Angioedema []   Moderate or severe aortic stenosis []   Hyperkalemia []   Hypotension []   Renal artery stenosis [x]   Worsening renal function, preexisting renal disease or dysfunction  Pollyann Samples, PharmD, BCPS 07/17/2016, 2:13 PM Pager: [phone]

## 2016-07-17 NOTE — Consult Note (Signed)
Hospital Consult    Reason for Consult:  +blood cultures with tunneled dialysis catheter Referring Physician:  Schertz, MD MRN #:  [id number]  History of Present Illness: This is a 65 y.o. female on HD T-Th-Sa via left femoral tdc since last May. She also has a left arm brachiocephalic avf since May that was revised in July. Now admitted with abdominal pain and soa and is physically deconditioned with blood cultures + for pseudomonas. She will dialyze today via catheter and we are consulted for removal and evaluation of left arm avf.   Past Medical History:  Diagnosis Date  . AICD (automatic cardioverter/defibrillator) present   . Anemia   . C. difficile colitis   . CHF (congestive heart failure) (HCC)    systolic  . COPD (chronic obstructive pulmonary disease) (HCC)   . Coronary artery disease   . Diabetes mellitus (HCC)   . DVT (deep venous thrombosis) (HCC)    on coumadin  . ESRD (end stage renal disease) (HCC)    tues/thurs/sat dialysis  . Hypertension   . Obstructive sleep apnea    no cpap  . Renal disorder     Past Surgical History:  Procedure Laterality Date  . AV FISTULA PLACEMENT Left 04/03/2016   Procedure: ARTERIOVENOUS (AV) FISTULA CREATION;  Surgeon: Todd F Early, MD;  Location: [facility] OR;  Service: Vascular;  Laterality: Left;  . FISTULA SUPERFICIALIZATION Left 06/16/2016   Procedure: SUPERFICIALIZATION OF LEFT ARM BRACHIOCEPHALIC ARTERIOVENOUS FISTULA;  Surgeon: Christopher S Dickson, MD;  Location: [facility] OR;  Service: Vascular;  Laterality: Left;  . INSERTION OF DIALYSIS CATHETER Left 04/03/2016   Procedure: INSERTION OF DIALYSIS CATHETER;  Surgeon: Todd F Early, MD;  Location: [facility] OR;  Service: Vascular;  Laterality: Left;  . LIGATION OF COMPETING BRANCHES OF ARTERIOVENOUS FISTULA Left 06/16/2016   Procedure: LIGATION OF COMPETING BRANCHES OF left brachiocephalic ARTERIOVENOUS FISTULA;  Surgeon: Christopher S Dickson, MD;  Location: [facility] OR;  Service: Vascular;  Laterality:  Left;  . REVISION OF ARTERIOVENOUS GORETEX GRAFT Left 06/16/2016   Procedure: REVISION OF LEFT BRACHIOCEPHALIC ARTERIOVENOUS FISTULA WITH RESECTION OF REDUNDANT SECTION;  Surgeon: Christopher S Dickson, MD;  Location: [facility] OR;  Service: Vascular;  Laterality: Left;    Allergies  Allergen Reactions  . Entresto [Sacubitril-Valsartan] Other (See Comments)     unknown reaction  . Sulfa Antibiotics Itching    Prior to Admission medications   Medication Sig Start Date End Date Taking? Authorizing Provider  acetaminophen (TYLENOL) 325 MG tablet Take 325 mg by mouth every 6 (six) hours as needed for mild pain or fever (For fever >99.5).    Yes Historical Provider, MD  albuterol (PROAIR HFA) 108 (90 Base) MCG/ACT inhaler Inhale 2 puffs into the lungs every 6 (six) hours as needed for wheezing or shortness of breath.    Yes Historical Provider, MD  Amino Acids-Protein Hydrolys (FEEDING SUPPLEMENT, PRO-STAT SUGAR FREE 64,) LIQD Take 30 mLs by mouth daily.   Yes Historical Provider, MD  atorvastatin (LIPITOR) 10 MG tablet Take 10 mg by mouth at bedtime.    Yes Historical Provider, MD  Budesonide (PULMICORT FLEXHALER) 90 MCG/ACT inhaler Inhale 2 puffs into the lungs 2 (two) times daily.   Yes Historical Provider, MD  carvedilol (COREG) 3.125 MG tablet Take 3.125 mg by mouth 2 (two) times daily with a meal. Hold for SBP <119 or HR <60   Yes Historical Provider, MD  famotidine (PEPCID) 20 MG tablet Take 20 mg by mouth 2 (two) times daily.      Yes Historical Provider, MD  hydrOXYzine (ATARAX/VISTARIL) 50 MG tablet Take 50 mg by mouth every 6 (six) hours as needed for itching.   Yes Historical Provider, MD  insulin glargine (LANTUS) 100 unit/mL SOPN Inject 0.1 mLs (10 Units total) into the skin at bedtime. 05/09/16  Yes David Tat, MD  insulin lispro (HUMALOG) 100 UNIT/ML injection Inject 0-10 Units into the skin 3 (three) times daily with meals. CBG 0-59 Hypoglycemic Protocol, 60-149 0 units 150-250 5 units,  251-300 8 units, 301-350 10 units, >350 call MD   Yes Historical Provider, MD  Inulin (FIBER CHOICE PO) Take 2 tablets by mouth daily as needed (constipation).   Yes Historical Provider, MD  LORazepam (ATIVAN) 0.5 MG tablet Take 0.5 mg by mouth 2 (two) times daily as needed for anxiety.   Yes Historical Provider, MD  mirtazapine (REMERON) 15 MG tablet Take 1 tablet (15 mg total) by mouth at bedtime. 07/05/16  Yes Dinah C Ngetich, NP  Multiple Vitamins-Minerals (CERTAVITE/ANTIOXIDANTS) TABS Take 1 tablet by mouth at bedtime.   Yes Historical Provider, MD  Nutritional Supplements (NUTRITIONAL SUPPLEMENT PO) Take 90 mLs by mouth 2 (two) times daily. [employer]   Yes Historical Provider, MD  oxyCODONE-acetaminophen (ROXICET) 5-325 MG tablet Take 1-2 tablets by mouth every 4 (four) hours as needed. Patient taking differently: Take 1-2 tablets by mouth every 4 (four) hours as needed (mild pain (1 tablet )/ severe pain (2 tablets)).  06/19/16  Yes Jessica K Eubanks, NP  OXYGEN Inhale 2 L into the lungs as needed.    Yes Historical Provider, MD  saccharomyces boulardii (FLORASTOR) 250 MG capsule Take 250 mg by mouth 2 (two) times daily. For recurrent C. Diff.  Take 1 tablet (250 mg) by mouth twice daily until 2 weeks after completion of antibiotics (stop date 08/05/16   Yes Historical Provider, MD  sertraline (ZOLOFT) 25 MG tablet Take 25 mg by mouth at bedtime.    Yes Historical Provider, MD  vancomycin (VANCOCIN) 50 mg/mL oral solution Take 125 mg by mouth See admin instructions. Tapered course started 06/25/16: take 125 mg daily for 7 days, then take 125 mg every other day for 7 days, then take 125 mg every 3 days at 8am for 14 days, then stop   Yes Historical Provider, MD    Social History   Social History  . Marital status: Single    Spouse name: N/A  . Number of children: N/A  . Years of education: N/A   Occupational History  . Not on file.   Social History Main Topics  . Smoking status: Former Smoker     Types: Cigarettes    Quit date: 03/03/2006  . Smokeless tobacco: Never Used  . Alcohol use No  . Drug use: No  . Sexual activity: No   Other Topics Concern  . Not on file   Social History Narrative  . No narrative on file    No family history on file.  ROS: [x] Positive   [ ] Negative   [ ] All sytems reviewed and are negative Cardiovascular: [] chest pain/pressure [] palpitations [x] SOB lying flat [] DOE [] pain in legs while walking [] pain in legs at rest [] pain in legs at night [] non-healing ulcers [x] hx of DVT [] swelling in legs  Pulmonary: [] productive cough [] asthma/wheezing [] home O2  Neurologic: [] weakness in [] arms [] legs [] numbness in [] arms [] legs [] hx of CVA [] mini   stroke   Endocrine:  [x] diabetes [] thyroid disease  GI [] vomiting blood [] blood in stool  GU: [x] CKD/renal failure [] HD--[] M/W/F or [] T/T/S  Psychiatric: [] anxiety [] depression   Constitutional: [] fever [] chills   Physical Examination  Vitals:   07/17/16 0900 07/17/16 0922  BP: 115/69   Pulse: 85 85  Resp: 20 20  Temp: 98.3 F (36.8 C)    Body mass index is 32.51 kg/m.  General:  WDWN in NAD, evidence of temporal wasting Gait: Not observed HENT: WNL, normocephalic Pulmonary: normal non-labored breathing Cardiac: rrr Abdomen:  soft, NT/ND, no masses Extremities: left arm palpable radial pulse, thrill noted at antecubitum, doppler of avf to axilla with good flow   CBC    Component Value Date/Time   WBC 8.9 07/16/2016 0546   RBC 4.06 07/16/2016 0546   HGB 10.0 (L) 07/16/2016 0546   HCT 31.5 (L) 07/16/2016 0546   PLT 220 07/16/2016 0546   MCV 77.6 (L) 07/16/2016 0546   MCH 24.6 (L) 07/16/2016 0546   MCHC 31.7 07/16/2016 0546   RDW 18.5 (H) 07/16/2016 0546   LYMPHSABS 1.1 07/15/2016 1331   MONOABS 0.9 07/15/2016 1331   EOSABS 0.3 07/15/2016 1331   BASOSABS 0.0 07/15/2016 1331    BMET    Component Value Date/Time   NA  135 07/16/2016 0545   NA 143 06/01/2016   K 4.0 07/16/2016 0545   CL 97 (L) 07/16/2016 0545   CO2 26 07/16/2016 0545   GLUCOSE 230 (H) 07/16/2016 0545   BUN 26 (H) 07/16/2016 0545   BUN 52 (A) 06/01/2016   CREATININE 4.62 (H) 07/16/2016 0545   CALCIUM 8.6 (L) 07/16/2016 0545   GFRNONAA 9 (L) 07/16/2016 0545   GFRAA 11 (L) 07/16/2016 0545    COAGS: Lab Results  Component Value Date   INR 1.85 07/15/2016   INR 2.62 (H) 05/08/2016   INR 2.65 (H) 05/07/2016      ASSESSMENT/PLAN: This is a 65 y.o. female with esrd on dialysis via L groin tdc since last May. Also has left arm avf that has been revised 1 months ago.  -tdc to be pulled after hd today -duplex lue avf  -further recs to follow regarding temporary access  Riven Mabile C. Mosi Hannold, MD [facility] Office: [phone] Pager: [phone]  

## 2016-07-17 NOTE — Progress Notes (Signed)
PROGRESS NOTE  Brandy Dudley  YSA:[id number] DOB: 1951-04-23 DOA: 07/15/2016 PCP: [name], MD Outpatient Specialists:  Subjective: Has some abdominal pain after eating this morning. No nausea or vomiting. Positive blood cultures for Pseudomonas 1/2.  Brief Narrative:  Brandy Dudley is a 65 y.o. female with medical history significant for chronic kidney disease on dialysis, history of recurrent C. Difficile colitis (initial treatment April 2017 with second treatment June 2017), umbilical hernia, OSA, diabetes, AICD in place, physical deconditioning, anemia of chronic kidney disease, Charcot foot who presented to the ER after developing shortness of breath during dialysis treatment and inability to tolerate outpatient dialysis treatment due to same. Patient reports that this past Thursday she missed a dialysis treatment to have an outpatient abdominal ultrasound obtained. It is noted she was unable to complete treatment today. Patient has already been evaluated by the nephrology team on the ER and despite having a negative chest x-ray clinical exam is consistent with volume overload so plans are to pursue dialysis as soon as possible today.  In addition to the above complaints patient has also been having issues with postprandial right upper quadrant abdominal pain associated with nausea and vomiting, very limited oral intake and subsequent 14 pound weight loss. This is documented in the nursing home. Patient confirms. Patient reports he still makes urine. She denies recently being told that she has had fevers and she denies chills. She is not having any diarrhea. She reports nonambulatory secondary to difficulty ambulating on right Charcot foot.  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 -Blood cultures obtained showed Pseudomonas. -Patient getting dialysis through left femoral line, I will get ID to see the patient.  Volume overload/ESRD on dialysis  -Patient presents with shortness of breath in setting of missed dialysis with clinical exam consistent with volume overload -Has had urgent dialysis yesterday with removal of 3 L of fluids -Oxygen prn and nightly at at bedtime    History of Clostridium difficile colitis/RUQ pain -Patient has been having issues with recurrent nausea and vomiting and right upper quadrant abdominal pain with weight loss and has been evaluated by outpatient GI and started on Pepcid -Had abdominal ultrasound as an outpatient-results unknown -No diarrhea, CT scan of abdomen/pelvis was done showed no colitis. -Abdominal pain resolved, not sure if this secondary to GERD. Started on diet, monitor to see if she tolerate it.    Leukocytosis -See above regarding possible colitis -Not sure what it was but probably stress de-margination, this is resolved overnight.    Obstructive sleep apnea -Per nursing home records patient does not utilize CPAP    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  -Current CBG 153 -Continue Lantus plus sliding scale insulin    Loss of weight -As above -Nutrition consultation    Physical deconditioning -PT and OT evaluation -May benefit from some sort of boot for offloading device for right Charcot foot to aid in improving mobilization    HTN -Blood  pressure currently controlled    AICD (automatic cardioverter/defibrillator) present/history of systolic heart  failure -Management with regular dialysis treatments -Continue other medical therapy with carvedilol    Anemia of chronic renal failure, stage 5  -Baseline hemoglobin between 9.6 and 10.2    Umbilical hernia without obstruction or gangrene  -Hernia unremarkable on today's exam   DVT prophylaxis:  Code Status: Full Code Family Communication:  Disposition Plan:  Diet: Diet renal/carb modified with fluid restriction Diet-HS Snack? Nothing; Room service appropriate? Yes; Fluid consistency: Thin  Consultants:   Nephrology.  ID  Procedures:   None  Antimicrobials:   Cefepime  Objective: Vitals:   07/16/16 2138 07/17/16 0534 07/17/16 0900 07/17/16 0922  BP: 110/68 125/77 115/69   Pulse: 83 86 85 85  Resp: 19 19 20 20   Temp: 97.7 F (36.5 C) 97.4 F (36.3 C) 98.3 F (36.8 C)   TempSrc: Oral Oral Oral   SpO2: 100% 94% 95% 96%  Weight:  85.9 kg (189 lb 6.4 oz)    Height:        Intake/Output Summary (Last 24 hours) at 07/17/16 1211 Last data filed at 07/17/16 0900  Gross per 24 hour  Intake              300 ml  Output                0 ml  Net              300 ml   Filed Weights   07/15/16 1312 07/15/16 2227 07/17/16 0534  Weight: 89 kg (196 lb 3.4 oz) 83.9 kg (185 lb) 85.9 kg (189 lb 6.4 oz)    Examination: General exam: Appears calm and comfortable  Respiratory system: Clear to auscultation. Respiratory effort normal. Cardiovascular system: S1 & S2 heard, RRR. No JVD, murmurs, rubs, gallops or clicks. No pedal edema. Gastrointestinal system: Abdomen is nondistended, soft and nontender. No organomegaly or masses felt. Normal bowel sounds heard. Central nervous system: Alert and oriented. No focal neurological deficits. Extremities: Symmetric 5 x 5 power. Skin: No rashes, lesions or ulcers Psychiatry: Judgement and insight appear normal. Mood & affect appropriate.   Data Reviewed: I have personally reviewed following labs and imaging  studies  CBC:  Recent Labs Lab 07/15/16 1331 07/16/16 0546  WBC 13.3* 8.9  NEUTROABS 11.0*  --   HGB 10.2* 10.0*  HCT 31.4* 31.5*  MCV 78.1 77.6*  PLT 333 220   Basic Metabolic Panel:  Recent Labs Lab 07/15/16 1800 07/16/16 0545  NA 135 135  K 4.3 4.0  CL 97* 97*  CO2 25 26  GLUCOSE 152* 230*  BUN 40* 26*  CREATININE 6.36* 4.62*  CALCIUM 8.5* 8.6*  MG 2.2  --   PHOS 4.7* 4.3   GFR: Estimated Creatinine Clearance: 12.9 mL/min (by C-G formula based on SCr of 4.62 mg/dL). Liver Function Tests:  Recent Labs Lab 07/15/16 1800 07/16/16 0545  ALBUMIN 2.0* 1.9*   No results for input(s): LIPASE, AMYLASE in the last 168 hours. No results for input(s): AMMONIA in the last 168 hours. Coagulation Profile:  Recent Labs Lab 07/15/16 1331  INR 1.85   Cardiac Enzymes: No results for input(s): CKTOTAL, CKMB, CKMBINDEX, TROPONINI in the last 168 hours. BNP (last 3 results) No results for input(s): PROBNP in the last 8760 hours. HbA1C:  Recent Labs  07/15/16 1930  HGBA1C 5.6   CBG:  Recent Labs Lab 07/16/16 0812 07/16/16 1152 07/16/16 1658 07/16/16 2138 07/17/16  0744  GLUCAP 206* 261* 211* 167* 152*   Lipid Profile: No results for input(s): CHOL, HDL, LDLCALC, TRIG, CHOLHDL, LDLDIRECT in the last 72 hours. Thyroid Function Tests: No results for input(s): TSH, T4TOTAL, FREET4, T3FREE, THYROIDAB in the last 72 hours. Anemia Panel: No results for input(s): VITAMINB12, FOLATE, FERRITIN, TIBC, IRON, RETICCTPCT in the last 72 hours. Urine analysis:    Component Value Date/Time   COLORURINE AMBER (A) 07/15/2016 1636   APPEARANCEUR TURBID (A) 07/15/2016 1636   LABSPEC 1.025 07/15/2016 1636   PHURINE 5.0 07/15/2016 1636   GLUCOSEU NEGATIVE 07/15/2016 1636   HGBUR MODERATE (A) 07/15/2016 1636   BILIRUBINUR MODERATE (A) 07/15/2016 1636   KETONESUR 15 (A) 07/15/2016 1636   PROTEINUR 100 (A) 07/15/2016 1636   NITRITE NEGATIVE 07/15/2016 1636   LEUKOCYTESUR  MODERATE (A) 07/15/2016 1636   Sepsis Labs: @LABRCNTIP (procalcitonin:4,lacticidven:4)  ) Recent Results (from the past 240 hour(s))  Urine culture     Status: None   Collection Time: 07/15/16  4:36 PM  Result Value Ref Range Status   Specimen Description URINE, CATHETERIZED  Final   Special Requests NONE  Final   Culture NO GROWTH  Final   Report Status 07/16/2016 FINAL  Final  Culture, blood (Routine X 2) w Reflex to ID Panel     Status: Abnormal (Preliminary result)   Collection Time: 07/15/16  6:20 PM  Result Value Ref Range Status   Specimen Description BLOOD URINE, CATHETERIZED  Final   Special Requests BOTTLES DRAWN AEROBIC AND ANAEROBIC 5CC  Final   Culture  Setup Time   Final    AEROBIC BOTTLE ONLY GRAM NEGATIVE RODS CRITICAL RESULT CALLED TO, READ BACK BY AND VERIFIED WITH: TO JLEDGFORD(PHARD) BY TCLEVELAND 07/17/2016 AT 3:29    Culture (A)  Final    PSEUDOMONAS AERUGINOSA CULTURE REINCUBATED FOR BETTER GROWTH    Report Status PENDING  Incomplete  Culture, blood (Routine X 2) w Reflex to ID Panel     Status: None (Preliminary result)   Collection Time: 07/15/16  6:20 PM  Result Value Ref Range Status   Specimen Description BLOOD URINE, CATHETERIZED  Final   Special Requests BOTTLES DRAWN AEROBIC AND ANAEROBIC 5CC  Final   Culture NO GROWTH < 24 HOURS  Final   Report Status PENDING  Incomplete  Blood Culture ID Panel (Reflexed)     Status: Abnormal   Collection Time: 07/15/16  6:20 PM  Result Value Ref Range Status   Enterococcus species NOT DETECTED NOT DETECTED Final   Vancomycin resistance NOT DETECTED NOT DETECTED Final   Listeria monocytogenes NOT DETECTED NOT DETECTED Final   Staphylococcus species NOT DETECTED NOT DETECTED Final   Staphylococcus aureus NOT DETECTED NOT DETECTED Final   Methicillin resistance NOT DETECTED NOT DETECTED Final   Streptococcus species NOT DETECTED NOT DETECTED Final   Streptococcus agalactiae NOT DETECTED NOT DETECTED Final    Streptococcus pneumoniae NOT DETECTED NOT DETECTED Final   Streptococcus pyogenes NOT DETECTED NOT DETECTED Final   Acinetobacter baumannii NOT DETECTED NOT DETECTED Final   Enterobacteriaceae species NOT DETECTED NOT DETECTED Final   Enterobacter cloacae complex NOT DETECTED NOT DETECTED Final   Escherichia coli NOT DETECTED NOT DETECTED Final   Klebsiella oxytoca NOT DETECTED NOT DETECTED Final   Klebsiella pneumoniae NOT DETECTED NOT DETECTED Final   Proteus species NOT DETECTED NOT DETECTED Final   Serratia marcescens NOT DETECTED NOT DETECTED Final   Carbapenem resistance NOT DETECTED NOT DETECTED Final   Haemophilus influenzae NOT DETECTED  NOT DETECTED Final   Neisseria meningitidis NOT DETECTED NOT DETECTED Final   Pseudomonas aeruginosa DETECTED (A) NOT DETECTED Final    Comment: CRITICAL RESULT CALLED TO, READ BACK BY AND VERIFIED WITH: TO JLEDGFORD(PHARD) BY TCLEVELAND 07/17/2016 AT 3:29    Candida albicans NOT DETECTED NOT DETECTED Final   Candida glabrata NOT DETECTED NOT DETECTED Final   Candida krusei NOT DETECTED NOT DETECTED Final   Candida parapsilosis NOT DETECTED NOT DETECTED Final   Candida tropicalis NOT DETECTED NOT DETECTED Final  MRSA PCR Screening     Status: None   Collection Time: 07/16/16 12:37 AM  Result Value Ref Range Status   MRSA by PCR NEGATIVE NEGATIVE Final    Comment:        The GeneXpert MRSA Assay (FDA approved for NASAL specimens only), is one component of a comprehensive MRSA colonization surveillance program. It is not intended to diagnose MRSA infection nor to guide or monitor treatment for MRSA infections.      Invalid input(s): PROCALCITONIN, LACTICACIDVEN   Radiology Studies: Ct Abdomen Pelvis Wo Contrast  Result Date: 07/16/2016 CLINICAL DATA:  Right upper quadrant pain and nausea EXAM: CT ABDOMEN AND PELVIS WITHOUT CONTRAST TECHNIQUE: Multidetector CT imaging of the abdomen and pelvis was performed following the standard  protocol without IV contrast. COMPARISON:  CT abdomen/ pelvis 07/06/2016 FINDINGS: The examination is degraded by motion and the lack of intravenous contrast. Lower chest: There is massive cardiomegaly. Moderate right and small left pleural effusions. No pulmonary nodules are identified. There is a calcified granuloma in the left lung base. Bilateral basilar atelectasis. Left femoral approach dialysis catheter terminates beyond the field of view. Hepatobiliary: The liver is enlarged with small volume perihepatic ascites. No liver lesions identified on this noncontrast study. Gallbladder appears normal. Pancreas: Normal unenhanced appearance of the pancreas. No pancreatic ductal dilatation. No peripancreatic fluid collection. Spleen: Normal spleen. Adrenals/Urinary Tract: Adrenal glands are normal. No hydronephrosis. No nephrolithiasis. Renal contours are normal. Urinary bladder is incompletely distended. Stomach/Bowel: No dilated small bowel or other evidence of obstruction. No enteric or colonic inflammation. The appendix is not clearly visualized. The stomach is moderately distended. Fat-containing ventral abdominal hernia. Vascular/Lymphatic: There is atherosclerotic calcification within the abdominal aorta and its branch vessels. There is a left femoral vein approach central venous catheter that terminates beyond the field of view. Reproductive: There is fluid within the endometrial cavity. The ovaries are unremarkable. Other: Diffuse anasarca. Musculoskeletal: Multilevel facet arthrosis and osteophytosis. IMPRESSION: 1. No acute intra-abdominal process. No evidence of pseudomembranous colitis. 2. Hepatomegaly small volume perihepatic ascites. 3. Massive cardiomegaly with bilateral pleural effusions, right-greater-than-left, which worsened prior study. 4. Aortic atherosclerosis. Electronically Signed   By: Deatra Robinson M.D.   On: 07/16/2016 04:16   Dg Chest Port 1 View  Result Date: 07/15/2016 CLINICAL  DATA:  Shortness of Breath EXAM: PORTABLE CHEST 1 VIEW COMPARISON:  March 23, 2016 FINDINGS: There is no edema or consolidation. There is cardiomegaly with pulmonary vascularity within normal limits. Pacemaker lead is attached to the right ventricle. There is atherosclerotic calcification in the aorta. No adenopathy. There is degenerative change in each shoulder. IMPRESSION: Cardiomegaly.  Aortic atherosclerosis.  No edema or consolidation. Electronically Signed   By: Bretta Bang III M.D.   On: 07/15/2016 14:34        Scheduled Meds: . atorvastatin  10 mg Oral QHS  . budesonide  0.25 mg Nebulization BID  . carvedilol  3.125 mg Oral BID WC  . [START ON  07/18/2016] ceFEPime (MAXIPIME) IV  2 g Intravenous Q T,Th,Sat-1800  . [START ON 07/18/2016] famotidine  20 mg Oral QHS  . heparin  5,000 Units Subcutaneous Q8H  . insulin aspart  0-5 Units Subcutaneous QHS  . insulin aspart  0-9 Units Subcutaneous TID WC  . insulin glargine  10 Units Subcutaneous QHS  . mirtazapine  15 mg Oral QHS  . multivitamin with minerals  1 tablet Oral QHS  . saccharomyces boulardii  250 mg Oral BID  . sertraline  25 mg Oral Daily  . sodium chloride flush  3 mL Intravenous Q12H   Continuous Infusions:    LOS: 0 days    Time spent: 35 minutes    Arturo Sofranko A, MD [facility] Pager [phone]  If 7PM-7AM, please contact night-coverage [email or web address] Password [email or web address] 07/17/2016, 12:11 PM

## 2016-07-17 NOTE — NC FL2 (Signed)
[place] MEDICAID FL2 LEVEL OF CARE SCREENING TOOL     IDENTIFICATION  Patient Name: Brandy Dudley Birthdate: [birth year] Sex: female Admission Date (Current Location): 07/15/2016  [place] and [place] Number:  [facility] Bast [id number] Facility and Address:  [facility], [address], [place], [place] [address]      Provider Number: [id number]  Attending Physician Name and Address:  Clydia Llano, MD  Relative Name and Phone Number:  [name] - sister:  [phone]    Current Level of Care: Hospital Recommended Level of Care: Skilled Nursing Facility Prior Approval Number:    Date Approved/Denied:   PASRR Number: [id number] (Eff. 02/09/16)  Discharge Plan: SNF    Current Diagnoses: Patient Active Problem List   Diagnosis Date Noted  . Positive blood culture 07/17/2016  . Atherosclerotic peripheral vascular disease (HCC) 07/17/2016  . Volume overload 07/15/2016  . Leukocytosis 07/15/2016  . Depression 07/05/2016  . Umbilical hernia without obstruction or gangrene 07/05/2016  . Generalized anxiety disorder 07/05/2016  . Loss of weight 07/05/2016  . Chronic combined systolic and diastolic CHF (congestive heart failure) (HCC) 05/07/2016  . RUQ pain 05/06/2016  . 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 Anemia of chronic renal failure, stage 5 (HCC) 04/14/2016  . ESRD on dialysis (HCC)   . History of Clostridium difficile colitis 03/20/2016  . COPD (chronic obstructive pulmonary disease) (HCC) 03/20/2016  . Obstructive sleep apnea 03/20/2016  . AICD (automatic cardioverter/defibrillator) present 03/20/2016  . Physical deconditioning 03/02/2016  . DVT (deep venous thrombosis), right 03/02/2016  . Cardiomyopathy, ischemic 03/02/2016  . Coronary artery disease involving native coronary artery of native heart without angina pectoris 03/02/2016  . Essential hypertension, malignant 03/02/2016  . Diabetes mellitus type 2  in obese (HCC) 03/02/2016    Orientation RESPIRATION BLADDER Height & Weight     Self, Time, Situation, Place  Normal Continent Weight: 184 lb 11.9 oz (83.8 kg) Height:  5\' 4"  (162.6 cm)  BEHAVIORAL SYMPTOMS/MOOD NEUROLOGICAL BOWEL NUTRITION STATUS      Continent Diet (Renal/carb modified with fluid restrictions)  AMBULATORY STATUS COMMUNICATION OF NEEDS Skin   Total Care (Patient was unable to ambulate with PT) Verbally Normal                       Personal Care Assistance Level of Assistance  Bathing, Feeding, Dressing Bathing Assistance: Maximum assistance Feeding assistance: Independent (assistance with set-up) Dressing Assistance: Maximum assistance     Functional Limitations Info  Sight, Hearing, Speech Sight Info: Adequate Hearing Info: Adequate Speech Info: Adequate    SPECIAL CARE FACTORS FREQUENCY  PT (By licensed PT), OT (By licensed OT)     PT Frequency: Evaluated 8/20 and a minimum of 2X per week therapy recommended OT Frequency: Evaluated 8/21 and a minimum of 2X per week therapy recommended             Contractures Contractures Info: Not present    Additional Factors Info  Code Status, Allergies Code Status Info: Full Allergies Info: Entresto and sulfa antibiotics           Current Medications (07/17/2016):  This is the current hospital active medication list Current Facility-Administered Medications  Medication Dose Route Frequency Provider Last Rate Last Dose  . 0.9 %  sodium chloride infusion  250 mL Intravenous PRN Russella Dar, NP      . acetaminophen (TYLENOL) tablet 650 mg  650 mg Oral Q6H PRN Revonda Standard  Audry PiliL Ellis, NP       Or  . acetaminophen (TYLENOL) suppository 650 mg  650 mg Rectal Q6H PRN Russella DarAllison L Ellis, NP      . albuterol (PROVENTIL) (2.5 MG/3ML) 0.083% nebulizer solution 3 mL  3 mL Inhalation Q6H PRN Russella DarAllison L Ellis, NP      . atorvastatin (LIPITOR) tablet 10 mg  10 mg Oral QHS Russella DarAllison L Ellis, NP   10 mg at 07/16/16 2134  .  budesonide (PULMICORT) nebulizer solution 0.25 mg  0.25 mg Nebulization BID Russella DarAllison L Ellis, NP   0.25 mg at 07/17/16 [id number]  . carvedilol (COREG) tablet 3.125 mg  3.125 mg Oral BID WC Russella DarAllison L Ellis, NP   3.125 mg at 07/17/16 [id number]  . [START ON 07/18/2016] ceFEPIme (MAXIPIME) 2 g in dextrose 5 % 50 mL IVPB  2 g Intravenous Q T,Th,Sat-1800 Stevphen RochesterJames L Ledford, RPH      . Darbepoetin Alfa (ARANESP) injection 150 mcg  150 mcg Intravenous Q Mon-HD Weston SettleMartha Bergman, PA-C      . Melene Muller[START ON 07/18/2016] famotidine (PEPCID) tablet 20 mg  20 mg Oral QHS Clydia LlanoMutaz Elmahi, MD      . heparin injection 5,000 Units  5,000 Units Subcutaneous Q8H Russella DarAllison L Ellis, NP   5,000 Units at 07/17/16 0510  . hydrOXYzine (ATARAX/VISTARIL) tablet 50 mg  50 mg Oral Q6H PRN Russella DarAllison L Ellis, NP   50 mg at 07/16/16 0951  . insulin aspart (novoLOG) injection 0-5 Units  0-5 Units Subcutaneous QHS Russella DarAllison L Ellis, NP      . insulin aspart (novoLOG) injection 0-9 Units  0-9 Units Subcutaneous TID WC Russella DarAllison L Ellis, NP   2 Units at 07/17/16 [phone]  . insulin glargine (LANTUS) injection 10 Units  10 Units Subcutaneous QHS Russella DarAllison L Ellis, NP   10 Units at 07/16/16 2139  . lactose free nutrition (BOOST PLUS) liquid 237 mL  237 mL Oral Q1500 Mutaz Elmahi, MD      . lidocaine (PF) (XYLOCAINE) 1 % injection 10 mL  10 mL Intradermal Once Clydia LlanoMutaz Elmahi, MD      . LORazepam (ATIVAN) tablet 0.5 mg  0.5 mg Oral BID PRN Russella DarAllison L Ellis, NP      . mirtazapine (REMERON) tablet 15 mg  15 mg Oral QHS Russella DarAllison L Ellis, NP   15 mg at 07/16/16 2134  . multivitamin with minerals tablet 1 tablet  1 tablet Oral QHS Russella DarAllison L Ellis, NP   1 tablet at 07/16/16 2134  . ondansetron (ZOFRAN) tablet 4 mg  4 mg Oral Q6H PRN Russella DarAllison L Ellis, NP       Or  . ondansetron [facility](ZOFRAN) injection 4 mg  4 mg Intravenous Q6H PRN Russella DarAllison L Ellis, NP   4 mg at 07/17/16 1101  . oxyCODONE-acetaminophen (PERCOCET/ROXICET) 5-325 MG per tablet 1-2 tablet  1-2 tablet Oral Q4H PRN Russella DarAllison L Ellis, NP    1 tablet at 07/17/16 [id number]  . saccharomyces boulardii (FLORASTOR) capsule 250 mg  250 mg Oral BID Russella DarAllison L Ellis, NP   250 mg at 07/17/16 1036  . sertraline (ZOLOFT) tablet 25 mg  25 mg Oral Daily Russella DarAllison L Ellis, NP   25 mg at 07/17/16 1036  . sodium chloride flush (NS) 0.9 % injection 3 mL  3 mL Intravenous Q12H Russella DarAllison L Ellis, NP   3 mL at 07/17/16 1038  . sodium chloride flush (NS) 0.9 % injection 3 mL  3 mL Intravenous PRN Russella DarAllison L Ellis, NP  Discharge Medications: Please see discharge summary for a list of discharge medications.  Relevant Imaging Results:  Relevant Lab Results:   Additional Information ss#[id number].  Dialysis patient - TTS at [facility].  Cristobal Goldmann, LCSW

## 2016-07-18 ENCOUNTER — Inpatient Hospital Stay (HOSPITAL_COMMUNITY): Payer: Medicare PPO

## 2016-07-18 DIAGNOSIS — N186 End stage renal disease: Secondary | ICD-10-CM

## 2016-07-18 DIAGNOSIS — E43 Unspecified severe protein-calorie malnutrition: Secondary | ICD-10-CM | POA: Insufficient documentation

## 2016-07-18 LAB — GLUCOSE, CAPILLARY
GLUCOSE-CAPILLARY: 96 mg/dL (ref 65–99)
GLUCOSE-CAPILLARY: 91 mg/dL (ref 65–99)
GLUCOSE-CAPILLARY: 115 mg/dL — AB (ref 65–99)
Glucose-Capillary: 90 mg/dL (ref 65–99)

## 2016-07-18 MED ORDER — CEFEPIME HCL 1 G IJ SOLR
1.0000 g | INTRAMUSCULAR | Status: DC
  Administered 2016-07-18 – 2016-07-19 (×2): 1 g via INTRAVENOUS
  Filled 2016-07-18 (×2): qty 1

## 2016-07-18 NOTE — Consult Note (Signed)
[facility] Renal Consultation Note    Indication for Consultation:  Management of ESRD/hemodialysis; anemia, hypertension/volume and secondary hyperparathyroidism PCP: [name], MD  HPI: Brandy Dudley is a 65 y.o. with ESRD secondary to cardiorenal syndrome with DM, AICD on TTS HD who presented 8/19 upon transfer from dialysis after 44 minutes of treatment due to acute onset of SOB, anxiety demanding to come to the hospital.  Prior to admission she was residing at [facility].  Pre HD weight was below prior post HD weight 8/15 (she skipped 8/17) by 0.3 and 1.3 below true EDW.  Initial BP was in the 120s HR 70 - 80s .She was admitted initially to an OBS bed and has converted to full inpatient status within the past 24 hours. Initial WBC was elevated at 13.3 with 83% neutrophils. Trop was 0.03. Cr was 6.36, BUN 40 and alb 2 K 4.3 CO2 25. CXR neg for volume with severe CM.  She was not febrile.  She completed her dialysis treatment in the hospital on 8/19 and actually had a net UF of 3.2 L due to JVD on admission. Blood cultures drawn on admission grew 1/2 + BC for psuedomonas. Urine culture neg. Given location of fem cath and low BC, persistent nausea, we elected to have catheter removed which was done this am.  She did have her "Tuesday" HD done on Monday due to planned removal of her tunneled dialysis catheter but low BP limited ultrafiltration with a net UF of 320 and post HD weight of 83.5 (bedscale).  She had nonspecific anxiety throughout which may have been made worse by low BP during HD on Monday.  The Chaplain was also called to help provide a calm spiritual presence which seemed helpful to her.  She feels better today and has been on the phone planning her [birth year] for [birth year].  She continues to nausea and abdominal pain.  She didn't eat anything today because of the medicine that gave her (she thinks antibiotics). She cannot recall if she ate dinner last night.  She has no SOB,  CP.  Past Medical History:  Diagnosis Date  . AICD (automatic cardioverter/defibrillator) present   . Anemia   . C. difficile colitis   . CHF (congestive heart failure) (HCC)    systolic  . COPD (chronic obstructive pulmonary disease) (HCC)   . Coronary artery disease   . Diabetes mellitus (HCC)   . DVT (deep venous thrombosis) (HCC)    on coumadin  . ESRD (end stage renal disease) (HCC)    tues/thurs/sat dialysis  . Hypertension   . Obstructive sleep apnea    no cpap  . Renal disorder    Past Surgical History:  Procedure Laterality Date  . AV FISTULA PLACEMENT Left 04/03/2016   Procedure: ARTERIOVENOUS (AV) FISTULA CREATION;  Surgeon: Larina Earthly, MD;  Location: [facility] OR;  Service: Vascular;  Laterality: Left;  . FISTULA SUPERFICIALIZATION Left 06/16/2016   Procedure: SUPERFICIALIZATION OF LEFT ARM BRACHIOCEPHALIC ARTERIOVENOUS FISTULA;  Surgeon: Chuck Hint, MD;  Location: [facility] OR;  Service: Vascular;  Laterality: Left;  . INSERTION OF DIALYSIS CATHETER Left 04/03/2016   Procedure: INSERTION OF DIALYSIS CATHETER;  Surgeon: Larina Earthly, MD;  Location: [facility] OR;  Service: Vascular;  Laterality: Left;  . LIGATION OF COMPETING BRANCHES OF ARTERIOVENOUS FISTULA Left 06/16/2016   Procedure: LIGATION OF COMPETING BRANCHES OF left brachiocephalic ARTERIOVENOUS FISTULA;  Surgeon: Chuck Hint, MD;  Location: [facility] OR;  Service: Vascular;  Laterality: Left;  .  REVISION OF ARTERIOVENOUS GORETEX GRAFT Left 06/16/2016   Procedure: REVISION OF LEFT BRACHIOCEPHALIC ARTERIOVENOUS FISTULA WITH RESECTION OF REDUNDANT SECTION;  Surgeon: Chuck Hinthristopher S Dickson, MD;  [facility];  Service: Vascular;  Laterality: Left;   No family history on file. Social History:  reports that she quit smoking about 10 years ago. Her smoking use included Cigarettes. She has never used smokeless tobacco. She reports that she does not drink alcohol or use drugs. Allergies  Allergen Reactions  . Entresto  [Sacubitril-Valsartan] Other (See Comments)     unknown reaction  . Sulfa Antibiotics Itching   Prior to Admission medications   Medication Sig Start Date End Date Taking? Authorizing Provider  acetaminophen (TYLENOL) 325 MG tablet Take 325 mg by mouth every 6 (six) hours as needed for mild pain or fever (For fever >99.5).    Yes Historical Provider, MD  albuterol (PROAIR HFA) 108 (90 Base) MCG/ACT inhaler Inhale 2 puffs into the lungs every 6 (six) hours as needed for wheezing or shortness of breath.    Yes Historical Provider, MD  Amino Acids-Protein Hydrolys (FEEDING SUPPLEMENT, PRO-STAT SUGAR FREE 64,) LIQD Take 30 mLs by mouth daily.   Yes Historical Provider, MD  atorvastatin (LIPITOR) 10 MG tablet Take 10 mg by mouth at bedtime.    Yes Historical Provider, MD  Budesonide (PULMICORT FLEXHALER) 90 MCG/ACT inhaler Inhale 2 puffs into the lungs 2 (two) times daily.   Yes Historical Provider, MD  carvedilol (COREG) 3.125 MG tablet Take 3.125 mg by mouth 2 (two) times daily with a meal. Hold for SBP <119 or HR <60   Yes Historical Provider, MD  famotidine (PEPCID) 20 MG tablet Take 20 mg by mouth 2 (two) times daily.    Yes Historical Provider, MD  hydrOXYzine (ATARAX/VISTARIL) 50 MG tablet Take 50 mg by mouth every 6 (six) hours as needed for itching.   Yes Historical Provider, MD  insulin glargine (LANTUS) 100 unit/mL SOPN Inject 0.1 mLs (10 Units total) into the skin at bedtime. 05/09/16  Yes Catarina Hartshornavid Tat, MD  insulin lispro (HUMALOG) 100 UNIT/ML injection Inject 0-10 Units into the skin 3 (three) times daily with meals. CBG 0-59 Hypoglycemic Protocol, 60-149 0 units 150-250 5 units, 251-300 8 units, 301-350 10 units, >350 call MD   Yes Historical Provider, MD  Inulin (FIBER CHOICE PO) Take 2 tablets by mouth daily as needed (constipation).   Yes Historical Provider, MD  LORazepam (ATIVAN) 0.5 MG tablet Take 0.5 mg by mouth 2 (two) times daily as needed for anxiety.   Yes Historical Provider, MD   mirtazapine (REMERON) 15 MG tablet Take 1 tablet (15 mg total) by mouth at bedtime. 07/05/16  Yes Dinah C Ngetich, NP  Multiple Vitamins-Minerals (CERTAVITE/ANTIOXIDANTS) TABS Take 1 tablet by mouth at bedtime.   Yes Historical Provider, MD  Nutritional Supplements (NUTRITIONAL SUPPLEMENT PO) Take 90 mLs by mouth 2 (two) times daily. [employer]   Yes Historical Provider, MD  oxyCODONE-acetaminophen (ROXICET) 5-325 MG tablet Take 1-2 tablets by mouth every 4 (four) hours as needed. Patient taking differently: Take 1-2 tablets by mouth every 4 (four) hours as needed (mild pain (1 tablet )/ severe pain (2 tablets)).  06/19/16  Yes Sharon SellerJessica K Eubanks, NP  OXYGEN Inhale 2 L into the lungs as needed.    Yes Historical Provider, MD  saccharomyces boulardii (FLORASTOR) 250 MG capsule Take 250 mg by mouth 2 (two) times daily. For recurrent C. Diff.  Take 1 tablet (250 mg) by mouth twice daily  until 2 weeks after completion of antibiotics (stop date 08/05/16   Yes Historical Provider, MD  sertraline (ZOLOFT) 25 MG tablet Take 25 mg by mouth at bedtime.    Yes Historical Provider, MD  vancomycin (VANCOCIN) 50 mg/mL oral solution Take 125 mg by mouth See admin instructions. Tapered course started 06/25/16: take 125 mg daily for 7 days, then take 125 mg every other day for 7 days, then take 125 mg every 3 days at 8am for 14 days, then stop   Yes Historical Provider, MD   Current Facility-Administered Medications  Medication Dose Route Frequency Provider Last Rate Last Dose  . 0.9 %  sodium chloride infusion  250 mL Intravenous PRN Russella Dar, NP      . acetaminophen (TYLENOL) tablet 650 mg  650 mg Oral Q6H PRN Russella Dar, NP       Or  . acetaminophen (TYLENOL) suppository 650 mg  650 mg Rectal Q6H PRN Russella Dar, NP      . albuterol (PROVENTIL) (2.5 MG/3ML) 0.083% nebulizer solution 3 mL  3 mL Inhalation Q6H PRN Russella Dar, NP      . atorvastatin (LIPITOR) tablet 10 mg  10 mg Oral QHS Russella Dar, NP   10 mg at 07/17/16 2125  . budesonide (PULMICORT) nebulizer solution 0.25 mg  0.25 mg Nebulization BID Russella Dar, NP   0.25 mg at 07/18/16 0830  . carvedilol (COREG) tablet 3.125 mg  3.125 mg Oral BID WC Russella Dar, NP   3.125 mg at 07/18/16 1011  . ceFEPIme (MAXIPIME) 1 g in dextrose 5 % 50 mL IVPB  1 g Intravenous Q24H Clydia Llano, MD      . famotidine (PEPCID) tablet 20 mg  20 mg Oral QHS Clydia Llano, MD      . heparin injection 5,000 Units  5,000 Units Subcutaneous Q8H Russella Dar, NP   5,000 Units at 07/18/16 0558  . hydrOXYzine (ATARAX/VISTARIL) tablet 50 mg  50 mg Oral Q6H PRN Russella Dar, NP   50 mg at 07/16/16 0951  . insulin aspart (novoLOG) injection 0-5 Units  0-5 Units Subcutaneous QHS Russella Dar, NP      . insulin aspart (novoLOG) injection 0-9 Units  0-9 Units Subcutaneous TID WC Russella Dar, NP   2 Units at 07/17/16 [phone]  . insulin glargine (LANTUS) injection 10 Units  10 Units Subcutaneous QHS Russella Dar, NP   10 Units at 07/17/16 2126  . lactose free nutrition (BOOST PLUS) liquid 237 mL  237 mL Oral Q1500 Mutaz Elmahi, MD      . lidocaine (PF) (XYLOCAINE) 1 % injection 10 mL  10 mL Intradermal Once Clydia Llano, MD      . LORazepam (ATIVAN) tablet 0.5 mg  0.5 mg Oral BID PRN Russella Dar, NP      . mirtazapine (REMERON) tablet 15 mg  15 mg Oral QHS Russella Dar, NP   15 mg at 07/17/16 2126  . multivitamin with minerals tablet 1 tablet  1 tablet Oral QHS Russella Dar, NP   1 tablet at 07/17/16 2125  . ondansetron (ZOFRAN) tablet 4 mg  4 mg Oral Q6H PRN Russella Dar, NP       Or  . ondansetron [facility]) injection 4 mg  4 mg Intravenous Q6H PRN Russella Dar, NP   4 mg at 07/17/16 1101  . oxyCODONE-acetaminophen (PERCOCET/ROXICET) 5-325 MG per tablet 1-2 tablet  1-2 tablet Oral Q4H PRN Russella Dar, NP   1 tablet at 07/17/16 [phone]  . saccharomyces boulardii (FLORASTOR) capsule 250 mg  250 mg Oral BID Russella Dar, NP    250 mg at 07/18/16 1011  . sertraline (ZOLOFT) tablet 25 mg  25 mg Oral Daily Russella Dar, NP   25 mg at 07/18/16 1011  . sodium chloride flush (NS) 0.9 % injection 3 mL  3 mL Intravenous Q12H Russella Dar, NP   3 mL at 07/18/16 1013  . sodium chloride flush (NS) 0.9 % injection 3 mL  3 mL Intravenous PRN Russella Dar, NP      . vancomycin (VANCOCIN) 50 mg/mL oral solution 125 mg  125 mg Oral Q6H Cliffton Asters, MD   125 mg at 07/18/16 0558   Labs: Basic Metabolic Panel:  Recent Labs Lab 07/15/16 1800 07/16/16 0545 07/17/16 1400  NA 135 135 136  K 4.3 4.0 4.1  CL 97* 97* 100*  CO2 25 26 29   GLUCOSE 152* 230* 124*  BUN 40* 26* 40*  CREATININE 6.36* 4.62* 5.64*  CALCIUM 8.5* 8.6* 8.6*  PHOS 4.7* 4.3 4.3   Liver Function Tests:  Recent Labs Lab 07/15/16 1800 07/16/16 0545 07/17/16 1400  ALBUMIN 2.0* 1.9* 1.9*   CBC:  Recent Labs Lab 07/15/16 1331 07/16/16 0546 07/17/16 1400  WBC 13.3* 8.9 9.8  NEUTROABS 11.0*  --   --   HGB 10.2* 10.0* 10.1*  HCT 31.4* 31.5* 31.7*  MCV 78.1 77.6* 77.9*  PLT 333 220 223  CBG:  Recent Labs Lab 07/17/16 0744 07/17/16 1226 07/17/16 1749 07/17/16 2124 07/18/16 0746  GLUCAP 152* 110* 127* 119* 96    ROS: As per HPI otherwise negative.  Physical Exam: Vitals:   07/17/16 2054 07/17/16 2131 07/18/16 0457 07/18/16 1000  BP:  105/65 (!) 97/58 117/68  Pulse:  91 74 73  Resp:  18 18 18   Temp:  97.6 F (36.4 C) 97.4 F (36.3 C) 98.2 F (36.8 C)  TempSrc:  Oral Oral Oral  SpO2: 93% 100% 94% 96%  Weight:  83.5 kg (184 lb 1.4 oz)    Height:         General: AAF NAD  Head: NCAT sclera not icteric MMM no upper teeth - left dentures at NH Neck: Supple  Lungs: dim bases bilateral crackles L>R Breathing is unlabored. Heart: RRR with S1 S2. No rub Abdomen: obese with umbilical hernia; central and RUQ tenderness; diffuse anasarca with dependent edema in flank/buttocks and upper thighs Lower extremities: + LE edema or  ischemic changes, no open wounds  Neuro: A & O  X 3. Moves all extremities spontaneously. Psych:  Responds to questions appropriately with a normal affect; calmer today than on HD yesterday. Dialysis Access: left Fem cath out - dressing intact left upper AVF maturing post ligation + bruit strongest in antecubital area  Dialysis Orders: [facility] TTS 4 hr 400/800 3 K 2.25 Ca profile 2 left fem cath left upper AVF heparin 6000 Mircera 225 last given 7/29 due to missed tmts no Fe or calcitriol Recent labs: hgb 10.2 8/11 28% sat July iPTH 182 no binders P 4.4   Assessment/Plan: 1. Abdominal pain/nausea - multiple emesis bags at bedside; no specific explanation for etiology of sx - she thinks pain is now more periumbilical; it could have had an ischemic component 2. Volume excess - net UF of 3.2 L on 8/19; Abdominal CT showed massive CM with bilateral pleural  effusions, diffuse anasarca - this is after 3.2 L were removed with HD 8/19; last Echo was 16109 with small pericardial effusion, EF 35- 40%. Given sig CM and symptoms would consider repeat Echo. Still significant volume with diffuse anasarca on exam. Most volume is central due to bedridden state. 3. ESRD TTS- patient has been poorly dialyzed in the outpt unit - rand 1.25 hr 8/11, almost 3 hr 8/15 and 44 min 8/19 to transfer to hospital. - missing 50% treatments; Cr relatively high at 6 given age and low muscle mass on admission likely due to underdialysis.  Seen by Dr. Randie Heinz yesterday .  Checking duplex of AVF to see if it can be used versus needing another TDC.  She sill need HD Wed with AVF or TDC due to excess volume. Plan HD for Wednesday. 4. BP /volume  - BP low during HD Monday into the 60s;  up to 90s - 110s today - better but still has volume to remove; on low dose coreg; may need to stop; may need to add midodrine 5. Anemia  - hgb 10.1 - stable - continue ESA - dosing slightly lower at Aranesp 150 given Monday x 1 off schedule 6. Metabolic bone  disease -  No VDRA or binders for now P iPTH ok 7. Nutrition - progressive weight loss with anasarca- intake poor 8. Hx C diff - risk or relapse - po Vanc started by ID 9. Umbilical hernia  10. Anxiety/depression - remeron/zoloft and prn ativan 11. 1/2 + BC pseudomonas aeruginosa - Day 2 cefepime- TDC out 8/22; ID thinks this is a contaminant;  12. Hx AICD 13. Disp - pt to return to [facility] - she is very debiliated for her age   Ronny Bacon [facility] Beeper [phone] 07/18/2016, 10:36 AM   Pt seen, examined and agree w A/P as above.  For new cath in about 48 hrs, will be OK for tunneled cath since ID suspects that the +blood cx is a contaminant.  On iV abx and afeb.  Vinson Moselle MD [employer] pager [phone]    cell [phone] 07/18/2016, 12:42 PM

## 2016-07-18 NOTE — Progress Notes (Addendum)
  [facility] Catheter Removal Procedure Note  Diagnosis: ESRD, + blood cultures  Plan:  Remove left femoral tunneled dialysis catheter  Consent signed:  Yes.   Time out completed:  Yes.   Coumadin:  No.  Procedure: 1.  Left thigh catheter site prepped with betadine. Sutures removed at exit site.  2. 0 ml 2% lidocaine plain instilled at removal site. 3.  Left femoral  catheter removed in its entirety with cuff in tact. 4.  Complications:  none 5. Tip of catheter sent for culture:  Yes.     Patient tolerated procedure well:  Yes.   Pressure held, no bleeding noted, dressing applied Instructions given to the pt regarding wound care and bleeding.  Await results of left upper extremity access duplex.   Maris Berger, PA-C 07/18/2016 8:33 AM   As above, pending left arm duplex to evaluate avf that was recently revised.   Evette Diclemente C. Randie Heinz, MD [facility] [place] Office: [phone] Pager: [phone]

## 2016-07-18 NOTE — Progress Notes (Signed)
Pharmacy Antibiotic Note Brandy Dudley is a 65 y.o. female admitted on 07/15/2016 with abdominal pain and subsequently found to have pseudomonas bacteremia in setting of ESRD (normally TTS). Currently she is on Cefepime day 2 for treatment.   S/p IHD on 8/21 (off normal schedule) with removal of left femoral tunneled dialysis catheter 8/22 am. Has PIV in place that is functionally well per d/w RN.    Plan: 1. Will adjust Cefepime dose to 1 gram IV every 24 hours as pt has PIV in place and IHD may be off schedule  for next few days.   Height: 5\' 4"  (162.6 cm) Weight: 184 lb 1.4 oz (83.5 kg) IBW/kg (Calculated) : 54.7  Temp (24hrs), Avg:97.6 F (36.4 C), Min:97.3 F (36.3 C), Max:98.3 F (36.8 C)   Recent Labs Lab 07/15/16 1331 07/15/16 1800 07/15/16 2100 07/16/16 0545 07/16/16 0546 07/17/16 1400  WBC 13.3*  --   --   --  8.9 9.8  CREATININE  --  6.36*  --  4.62*  --  5.64*  LATICACIDVEN  --   --  0.8  --   --   --     Estimated Creatinine Clearance: 10.4 mL/min (by C-G formula based on SCr of 5.64 mg/dL).    Allergies  Allergen Reactions  . Entresto [Sacubitril-Valsartan] Other (See Comments)     unknown reaction  . Sulfa Antibiotics Itching    Antimicrobials this admission: 8/21 Cefepime >> 8/21 vancomycin  PO >>    Microbiology results: 8/19 BCx: P.aerginosa per BCID 8/19 UCx: ngF    Thank you for allowing pharmacy to be a part of this patient's care.  Brandy Dudley, PharmD, BCPS 07/18/2016, 9:38 AM Pager: [phone]

## 2016-07-18 NOTE — Progress Notes (Signed)
**  Preliminary report by tech**   Left radiocephalic arteriovenous fistula assessment completed. The left radiocephalic arteriovenous fistula is patent with no evidence of branches or free fluid were noted surrounding the fistula. Distal to the anastomosis, the radial arterial demonstrates reversed flow. The radial artery waveform appears within normal limits at the mid forearm.   07/18/16 2:48 PM Olen Cordial RVT

## 2016-07-18 NOTE — Progress Notes (Signed)
PROGRESS NOTE  Brandy Dudley  ZOX:[id number] DOB: October 22, 1951 DOA: 07/15/2016 PCP: Oneal GroutPANDEY, MAHIMA, MD Outpatient Specialists:  Subjective: Femoral dialysis access catheter removed, likely to re-place in 2 days. Repeat blood cultures today.  Brief Narrative:  Brandy SchoonerHelen Clare is a 65 y.o. female with medical history significant for chronic kidney disease on dialysis, history of recurrent C. Difficile colitis (initial treatment April 2017 with second treatment June 2017), umbilical hernia, OSA, diabetes, AICD in place, physical deconditioning, anemia of chronic kidney disease, Charcot foot who presented to the ER after developing shortness of breath during dialysis treatment and inability to tolerate outpatient dialysis treatment due to same. Patient reports that this past Thursday she missed a dialysis treatment to have an outpatient abdominal ultrasound obtained. It is noted she was unable to complete treatment today. Patient has already been evaluated by the nephrology team on the ER and despite having a negative chest x-ray clinical exam is consistent with volume overload so plans are to pursue dialysis as soon as possible today.  In addition to the above complaints patient has also been having issues with postprandial right upper quadrant abdominal pain associated with nausea and vomiting, very limited oral intake and subsequent 14 pound weight loss. This is documented in the nursing home. Patient confirms. Patient reports he still makes urine. She denies recently being told that she has had fevers and she denies chills. She is not having any diarrhea. She reports nonambulatory secondary to difficulty ambulating on right Charcot foot.  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 -Blood cultures obtained showed Pseudomonas. -Patient getting dialysis through left femoral line, this is removed. -Line holiday likely for couple days, repeat blood cultures today, continue antibiotics.  Volume overload/ESRD on dialysis  -Patient presents with shortness of breath in setting of missed dialysis with clinical exam consistent with volume overload -Has had urgent dialysis admission with removal of 3 L of fluids -Oxygen prn and nightly at at bedtime    History of Clostridium difficile colitis/RUQ pain -Patient has been having issues with recurrent nausea and vomiting and right upper quadrant abdominal pain with weight loss and has been evaluated by outpatient GI and started on Pepcid -Had abdominal ultrasound as an outpatient-results unknown -No diarrhea, CT scan of abdomen/pelvis was done showed no colitis.    Leukocytosis -See above regarding possible colitis -Not sure what it was but probably stress de-margination, this is resolved overnight.    Obstructive sleep apnea -Per nursing home records patient does not utilize CPAP    Type II diabetes mellitus with end-stage renal  disease  -Current CBG 153 -Continue Lantus plus  sliding scale insulin    Loss of weight -As above -Nutrition consultation    Physical deconditioning -PT and OT evaluation -May benefit from some sort of boot for offloading device for right Charcot foot to aid in improving mobilization    HTN -Blood pressure currently controlled    AICD (automatic cardioverter/defibrillator) present/history of systolic heart failure -Management with regular dialysis treatments -Continue other medical therapy with carvedilol    Anemia of chronic renal failure, stage 5  -Baseline hemoglobin between 9.6 and 10.2    Umbilical hernia without obstruction or gangrene  -Hernia unremarkable on today's exam   DVT prophylaxis:  Code Status: Full Code Family Communication:  Disposition Plan:  Diet: Diet renal/carb modified with fluid restriction Diet-HS Snack? Nothing; Room service appropriate? Yes; Fluid consistency: Thin  Consultants:   Nephrology.  ID  Procedures:   None  Antimicrobials:   Cefepime  Objective: Vitals:   07/17/16 2054 07/17/16 2131 07/18/16 0457 07/18/16 1000  BP:  105/65 (!) 97/58 117/68  Pulse:  91 74 73  Resp:  18 18 18   Temp:  97.6 F (36.4 C) 97.4 F (36.3 C) 98.2 F (36.8 C)  TempSrc:  Oral Oral Oral  SpO2: 93% 100% 94% 96%  Weight:  83.5 kg (184 lb 1.4 oz)    Height:        Intake/Output Summary (Last 24 hours) at 07/18/16 1201 Last data filed at 07/18/16 0900  Gross per 24 hour  Intake              180 ml  Output              320 ml  Net             -140 ml   Filed Weights   07/17/16 1400 07/17/16 1704 07/17/16 2131  Weight: 83.8 kg (184 lb 11.9 oz) 83.5 kg (184 lb 1.4 oz) 83.5 kg (184 lb 1.4 oz)    Examination: General exam: Appears calm and comfortable  Respiratory system: Clear to auscultation. Respiratory effort normal. Cardiovascular system: S1 & S2 heard, RRR. No JVD, murmurs, rubs, gallops or clicks. No pedal edema. Gastrointestinal system: Abdomen is nondistended, soft and  nontender. No organomegaly or masses felt. Normal bowel sounds heard. Central nervous system: Alert and oriented. No focal neurological deficits. Extremities: Symmetric 5 x 5 power. Skin: No rashes, lesions or ulcers Psychiatry: Judgement and insight appear normal. Mood & affect appropriate.   Data Reviewed: I have personally reviewed following labs and imaging studies  CBC:  Recent Labs Lab 07/15/16 1331 07/16/16 0546 07/17/16 1400  WBC 13.3* 8.9 9.8  NEUTROABS 11.0*  --   --   HGB 10.2* 10.0* 10.1*  HCT 31.4* 31.5* 31.7*  MCV 78.1 77.6* 77.9*  PLT 333 220 223   Basic Metabolic Panel:  Recent Labs Lab 07/15/16 1800 07/16/16 0545 07/17/16 1400  NA 135 135 136  K 4.3 4.0 4.1  CL 97* 97* 100*  CO2 25 26 29   GLUCOSE 152* 230* 124*  BUN 40* 26* 40*  CREATININE 6.36* 4.62* 5.64*  CALCIUM 8.5* 8.6* 8.6*  MG 2.2  --   --   PHOS 4.7* 4.3 4.3   GFR: Estimated Creatinine Clearance: 10.4 mL/min (by C-G formula based on SCr of 5.64 mg/dL). Liver Function Tests:  Recent Labs Lab 07/15/16 1800 07/16/16 0545 07/17/16 1400  ALBUMIN 2.0* 1.9* 1.9*   No results for input(s): LIPASE, AMYLASE in the  last 168 hours. No results for input(s): AMMONIA in the last 168 hours. Coagulation Profile:  Recent Labs Lab 07/15/16 1331  INR 1.85   Cardiac Enzymes: No results for input(s): CKTOTAL, CKMB, CKMBINDEX, TROPONINI in the last 168 hours. BNP (last 3 results) No results for input(s): PROBNP in the last 8760 hours. HbA1C:  Recent Labs  07/15/16 1930  HGBA1C 5.6   CBG:  Recent Labs Lab 07/17/16 1226 07/17/16 1749 07/17/16 2124 07/18/16 0746 07/18/16 1142  GLUCAP 110* 127* 119* 96 91   Lipid Profile: No results for input(s): CHOL, HDL, LDLCALC, TRIG, CHOLHDL, LDLDIRECT in the last 72 hours. Thyroid Function Tests: No results for input(s): TSH, T4TOTAL, FREET4, T3FREE, THYROIDAB in the last 72 hours. Anemia Panel: No results for input(s): VITAMINB12, FOLATE,  FERRITIN, TIBC, IRON, RETICCTPCT in the last 72 hours. Urine analysis:    Component Value Date/Time   COLORURINE AMBER (A) 07/15/2016 1636   APPEARANCEUR TURBID (A) 07/15/2016 1636   LABSPEC 1.025 07/15/2016 1636   PHURINE 5.0 07/15/2016 1636   GLUCOSEU NEGATIVE 07/15/2016 1636   HGBUR MODERATE (A) 07/15/2016 1636   BILIRUBINUR MODERATE (A) 07/15/2016 1636   KETONESUR 15 (A) 07/15/2016 1636   PROTEINUR 100 (A) 07/15/2016 1636   NITRITE NEGATIVE 07/15/2016 1636   LEUKOCYTESUR MODERATE (A) 07/15/2016 1636   Sepsis Labs: @LABRCNTIP (procalcitonin:4,lacticidven:4)  ) Recent Results (from the past 240 hour(s))  Urine culture     Status: None   Collection Time: 07/15/16  4:36 PM  Result Value Ref Range Status   Specimen Description URINE, CATHETERIZED  Final   Special Requests NONE  Final   Culture NO GROWTH  Final   Report Status 07/16/2016 FINAL  Final  Culture, blood (Routine X 2) w Reflex to ID Panel     Status: Abnormal (Preliminary result)   Collection Time: 07/15/16  6:20 PM  Result Value Ref Range Status   Specimen Description BLOOD VENOUS CATHETER  Final   Special Requests BOTTLES DRAWN AEROBIC AND ANAEROBIC 5CC  Final   Culture  Setup Time   Final    AEROBIC BOTTLE ONLY GRAM NEGATIVE RODS CRITICAL RESULT CALLED TO, READ BACK BY AND VERIFIED WITH: TO JLEDGFORD(PHARD) BY TCLEVELAND 07/17/2016 AT 3:29    Culture (A)  Final    PSEUDOMONAS AERUGINOSA CULTURE REINCUBATED FOR BETTER GROWTH    Report Status PENDING  Incomplete  Culture, blood (Routine X 2) w Reflex to ID Panel     Status: None (Preliminary result)   Collection Time: 07/15/16  6:20 PM  Result Value Ref Range Status   Specimen Description BLOOD ARTERIAL CATHETER  Final   Special Requests BOTTLES DRAWN AEROBIC AND ANAEROBIC 5CC  Final   Culture NO GROWTH 2 DAYS  Final   Report Status PENDING  Incomplete  Blood Culture ID Panel (Reflexed)     Status: Abnormal   Collection Time: 07/15/16  6:20 PM  Result Value  Ref Range Status   Enterococcus species NOT DETECTED NOT DETECTED Final   Vancomycin resistance NOT DETECTED NOT DETECTED Final   Listeria monocytogenes NOT DETECTED NOT DETECTED Final   Staphylococcus species NOT DETECTED NOT DETECTED Final   Staphylococcus aureus NOT DETECTED NOT DETECTED Final   Methicillin resistance NOT DETECTED NOT DETECTED Final   Streptococcus species NOT DETECTED NOT DETECTED Final   Streptococcus agalactiae NOT DETECTED NOT DETECTED Final   Streptococcus pneumoniae NOT DETECTED NOT DETECTED Final   Streptococcus pyogenes NOT DETECTED NOT DETECTED Final   Acinetobacter baumannii NOT DETECTED NOT DETECTED  Final   Enterobacteriaceae species NOT DETECTED NOT DETECTED Final   Enterobacter cloacae complex NOT DETECTED NOT DETECTED Final   Escherichia coli NOT DETECTED NOT DETECTED Final   Klebsiella oxytoca NOT DETECTED NOT DETECTED Final   Klebsiella pneumoniae NOT DETECTED NOT DETECTED Final   Proteus species NOT DETECTED NOT DETECTED Final   Serratia marcescens NOT DETECTED NOT DETECTED Final   Carbapenem resistance NOT DETECTED NOT DETECTED Final   Haemophilus influenzae NOT DETECTED NOT DETECTED Final   Neisseria meningitidis NOT DETECTED NOT DETECTED Final   Pseudomonas aeruginosa DETECTED (A) NOT DETECTED Final    Comment: CRITICAL RESULT CALLED TO, READ BACK BY AND VERIFIED WITH: TO JLEDGFORD(PHARD) BY TCLEVELAND 07/17/2016 AT 3:29    Candida albicans NOT DETECTED NOT DETECTED Final   Candida glabrata NOT DETECTED NOT DETECTED Final   Candida krusei NOT DETECTED NOT DETECTED Final   Candida parapsilosis NOT DETECTED NOT DETECTED Final   Candida tropicalis NOT DETECTED NOT DETECTED Final  MRSA PCR Screening     Status: None   Collection Time: 07/16/16 12:37 AM  Result Value Ref Range Status   MRSA by PCR NEGATIVE NEGATIVE Final    Comment:        The GeneXpert MRSA Assay (FDA approved for NASAL specimens only), is one component of a comprehensive MRSA  colonization surveillance program. It is not intended to diagnose MRSA infection nor to guide or monitor treatment for MRSA infections.      Invalid input(s): PROCALCITONIN, LACTICACIDVEN   Radiology Studies: No results found.      Scheduled Meds: . atorvastatin  10 mg Oral QHS  . budesonide  0.25 mg Nebulization BID  . carvedilol  3.125 mg Oral BID WC  . ceFEPime (MAXIPIME) IV  1 g Intravenous Q24H  . famotidine  20 mg Oral QHS  . heparin  5,000 Units Subcutaneous Q8H  . insulin aspart  0-5 Units Subcutaneous QHS  . insulin aspart  0-9 Units Subcutaneous TID WC  . insulin glargine  10 Units Subcutaneous QHS  . lactose free nutrition  237 mL Oral Q1500  . lidocaine (PF)  10 mL Intradermal Once  . mirtazapine  15 mg Oral QHS  . multivitamin with minerals  1 tablet Oral QHS  . saccharomyces boulardii  250 mg Oral BID  . sertraline  25 mg Oral Daily  . sodium chloride flush  3 mL Intravenous Q12H  . vancomycin  125 mg Oral Q6H   Continuous Infusions:    LOS: 1 day    Time spent: 35 minutes    Sarahbeth Cashin A, MD [facility] Pager [phone]  If 7PM-7AM, please contact night-coverage [email or web address] Password [email or web address] 07/18/2016, 12:01 PM

## 2016-07-18 NOTE — Progress Notes (Signed)
Patient ID: Brandy Dudley, female   DOB: [birth year], 65 y.o.   MRN: [id number]          [facility]    Date of Admission:  07/15/2016   Day 3 cefepime        Day 2 oral vancomycin  Brandy Dudley is off the floor for a procedure in the vascular lab. She remains afebrile. Her nurse reports to me that she had some nausea this morning but has not had vomiting, diarrhea or abdominal pain. Her left femoral hemodialysis catheter was removed this morning. She has grown Pseudomonas from a one blood culture that was drawn through the catheter. Antibiotic susceptibilities are pending. Her peripheral stick blood culture remains negative. I will continue cefepime and oral vancomycin for now.         Cliffton Asters, MD [facility] [phone] pager   [phone] cell 11/30/2015, 1:32 PM

## 2016-07-19 DIAGNOSIS — E43 Unspecified severe protein-calorie malnutrition: Secondary | ICD-10-CM

## 2016-07-19 DIAGNOSIS — I5042 Chronic combined systolic (congestive) and diastolic (congestive) heart failure: Secondary | ICD-10-CM

## 2016-07-19 DIAGNOSIS — E669 Obesity, unspecified: Secondary | ICD-10-CM

## 2016-07-19 DIAGNOSIS — I10 Essential (primary) hypertension: Secondary | ICD-10-CM

## 2016-07-19 DIAGNOSIS — Z95810 Presence of automatic (implantable) cardiac defibrillator: Secondary | ICD-10-CM

## 2016-07-19 DIAGNOSIS — R5381 Other malaise: Secondary | ICD-10-CM

## 2016-07-19 DIAGNOSIS — D631 Anemia in chronic kidney disease: Secondary | ICD-10-CM

## 2016-07-19 DIAGNOSIS — R1011 Right upper quadrant pain: Secondary | ICD-10-CM

## 2016-07-19 DIAGNOSIS — J449 Chronic obstructive pulmonary disease, unspecified: Secondary | ICD-10-CM

## 2016-07-19 DIAGNOSIS — E119 Type 2 diabetes mellitus without complications: Secondary | ICD-10-CM

## 2016-07-19 DIAGNOSIS — D72829 Elevated white blood cell count, unspecified: Secondary | ICD-10-CM

## 2016-07-19 DIAGNOSIS — G4733 Obstructive sleep apnea (adult) (pediatric): Secondary | ICD-10-CM

## 2016-07-19 DIAGNOSIS — E1122 Type 2 diabetes mellitus with diabetic chronic kidney disease: Secondary | ICD-10-CM

## 2016-07-19 DIAGNOSIS — I70209 Unspecified atherosclerosis of native arteries of extremities, unspecified extremity: Secondary | ICD-10-CM

## 2016-07-19 DIAGNOSIS — R634 Abnormal weight loss: Secondary | ICD-10-CM

## 2016-07-19 DIAGNOSIS — N185 Chronic kidney disease, stage 5: Secondary | ICD-10-CM

## 2016-07-19 DIAGNOSIS — E8779 Other fluid overload: Secondary | ICD-10-CM

## 2016-07-19 LAB — BASIC METABOLIC PANEL
ANION GAP: 8 (ref 5–15)
BUN: 41 mg/dL — ABNORMAL HIGH (ref 6–20)
CALCIUM: 8.4 mg/dL — AB (ref 8.9–10.3)
CO2: 25 mmol/L (ref 22–32)
CREATININE: 5.7 mg/dL — AB (ref 0.44–1.00)
Chloride: 102 mmol/L (ref 101–111)
GFR calc Af Amer: 8 mL/min — ABNORMAL LOW (ref >60)
GFR, EST NON AFRICAN AMERICAN: 7 mL/min — AB (ref >60)
GLUCOSE: 76 mg/dL (ref 65–99)
Potassium: 4.4 mmol/L (ref 3.5–5.1)
Sodium: 135 mmol/L (ref 135–145)

## 2016-07-19 LAB — GLUCOSE, CAPILLARY
GLUCOSE-CAPILLARY: 74 mg/dL (ref 65–99)
GLUCOSE-CAPILLARY: 80 mg/dL (ref 65–99)
GLUCOSE-CAPILLARY: 77 mg/dL (ref 65–99)
Glucose-Capillary: 84 mg/dL (ref 65–99)

## 2016-07-19 LAB — CBC
HCT: 31.5 % — ABNORMAL LOW (ref 36.0–46.0)
HEMOGLOBIN: 9.9 g/dL — AB (ref 12.0–15.0)
MCH: 24.3 pg — ABNORMAL LOW (ref 26.0–34.0)
MCHC: 31.4 g/dL (ref 30.0–36.0)
MCV: 77.4 fL — ABNORMAL LOW (ref 78.0–100.0)
PLATELETS: 168 10*3/uL (ref 150–400)
RBC: 4.07 MIL/uL (ref 3.87–5.11)
RDW: 18.7 % — AB (ref 11.5–15.5)
WBC: 8.7 10*3/uL (ref 4.0–10.5)

## 2016-07-19 LAB — CULTURE, BLOOD (ROUTINE X 2)

## 2016-07-19 NOTE — Progress Notes (Signed)
PROGRESS NOTE    Brandy Dudley  RUE:[id number] DOB: [birth year] DOA: 07/15/2016 PCP: [name], MD   Chief Complaint  Patient presents with  . Shortness of Breath    Brief Narrative:  HPI On 07/15/2016 by Ms. [name], NP Brandy Dudley is a 65 y.o. female with medical history significant for chronic kidney disease on dialysis, history of recurrent C. Difficile colitis (initial treatment April 2017 with second treatment June 2017), umbilical hernia, OSA, diabetes, AICD in place, physical deconditioning, anemia of chronic kidney disease, Charcot foot who presented to the ER after developing shortness of breath during dialysis treatment and inability to tolerate outpatient dialysis treatment due to same. Patient reports that this past Thursday she missed a dialysis treatment to have an outpatient abdominal ultrasound obtained. It is noted she was unable to complete treatment today. Patient has already been evaluated by the nephrology team on the ER and despite having a negative chest x-ray clinical exam is consistent with volume overload so plans are to pursue dialysis as soon as possible today.  In addition to the above complaints patient has also been having issues with postprandial right upper quadrant abdominal pain associated with nausea and vomiting, very limited oral intake and subsequent 14 pound weight loss. This is documented in the nursing home. Patient confirms. Patient reports he still makes urine. She denies recently being told that she has had fevers and she denies chills. She is not having any diarrhea. She reports nonambulatory secondary to difficulty ambulating on right Charcot foot.  Assessment & Plan   Bacteremia -Pseudomonas bacteremia 1/2 blood cultures 07/15/16 -patient initially presented with RUQ abdominal pain and vague generalized symptoms. Blood cultures from 07/18/2016 pending -Infectious disease consulted, recommended oral vancomycin and cefepime -Patient getting  dialysis through left femoral line, this is removed.  Volume overload/ESRD on dialysis  -Patient presents with shortness of breath in setting of missed dialysis with clinical exam consistent with volume overload -Had urgent dialysis admission with removal of 3 L of fluids -Oxygen prn and nightly at at bedtime  History of Clostridium difficile colitis/RUQ pain -Patient has been having issues with recurrent nausea and vomiting and right upper quadrant abdominal pain with weight loss -Had abdominal ultrasound as an outpatient-results unknown -No diarrhea, CT scan of abdomen/pelvis was done showed no colitis. -Gastroenterology consulted and appreciated  Leukocytosis -Resolved, See above regarding possible colitis -Not sure what it was but probably stress de-margination, this is resolved overnight.  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  -Continue Lantus, sliding scale insulin, CBG monitoring   Weight loss -As above -Nutrition consultation  Physical deconditioning -PT and OT evaluation- recommended SNF -May benefit from some sort of boot for offloading device for right Charcot foot to aid in improving mobilization  Essential hypertension -Blood pressure currently controlled  AICD (automatic cardioverter/defibrillator) present/history of systolic heart failure -Management with regular dialysis treatments -Continue other medical therapy with carvedilol  Anemia of chronic renal failure, stage 5  -Baseline hemoglobin between 9.6 and 10.2, currently 9.9  Umbilical hernia without obstruction or gangrene -Hernia unremarkable on today's exam  DVT Prophylaxis  Heparin  Code Status: Full  Family Communication: None at bedside  Disposition Plan: Admitted.   Consultants Nephrology Infectious disease Gastroenterology  Procedures  None  Antibiotics   Anti-infectives     Start     Dose/Rate Route Frequency Ordered Stop   07/18/16 1800  ceFEPIme (MAXIPIME) 2 g  in dextrose 5 % 50 mL IVPB  Status:  Discontinued     2 g 100 mL/hr over 30 Minutes Intravenous Every T-Th-Sa (1800) 07/17/16 0339 07/18/16 0932   07/18/16 1000  ceFEPIme (MAXIPIME) 1 g in dextrose 5 % 50 mL IVPB     1 g 100 mL/hr over 30 Minutes Intravenous Every 24 hours 07/18/16 0932     07/17/16 1800  vancomycin (VANCOCIN) 50 mg/mL oral solution 125 mg     125 mg Oral Every 6 hours 07/17/16 1615     07/17/16 0345  ceFEPIme (MAXIPIME) 2 g in dextrose 5 % 50 mL IVPB     2 g 100 mL/hr over 30 Minutes Intravenous  Once 07/17/16 0339 07/17/16 0544      Subjective:   Brandy Dudley seen and examined today.  Continues to complain of abdominal pain, loss of appetite and nausea. Patient states she has not seen a GI doctor or ever had a colonoscopy or EGD. He denies chest pain, feels her shortness of breath is improved.  Objective:   Vitals:   07/18/16 2114 07/19/16 0529 07/19/16 0906 07/19/16 0908  BP:  116/68  112/68  Pulse: 72 78 76 73  Resp: 18 17 18 18   Temp:  98.4 F (36.9 C)  97.8 F (36.6 C)  TempSrc:    Oral  SpO2:  95% 97% 97%  Weight:      Height:        Intake/Output Summary (Last 24 hours) at 07/19/16 1221 Last data filed at 07/19/16 1031  Gross per 24 hour  Intake              830 ml  Output                0 ml  Net              830 ml   Filed Weights   07/17/16 1704 07/17/16 2131 07/18/16 2025  Weight: 83.5 kg (184 lb 1.4 oz) 83.5 kg (184 lb 1.4 oz) 83 kg (182 lb 15.7 oz)    Exam  General: Well developed, well nourished, NAD, appears stated age  HEENT: NCAT,  mucous membranes moist.   Cardiovascular: S1 S2 auscultated, no rubs, murmurs or gallops. Regular rate and rhythm.  Respiratory: Diminished breath sounds, mild crackles noted in the lung bases.  Abdomen: Soft,RUQ TTP, nondistended, + bowel sounds, umbilical hernia  Extremities: warm dry without cyanosis  clubbing . +LE edema.  Neuro: AAOx3, nonfocal  Psych: Normal affect and demeanor with intact judgement and insight   Data Reviewed: I have personally reviewed following labs and imaging studies  CBC:  Recent Labs Lab 07/15/16 1331 07/16/16 0546 07/17/16 1400 07/19/16 0452  WBC 13.3* 8.9 9.8 8.7  NEUTROABS 11.0*  --   --   --   HGB 10.2* 10.0* 10.1* 9.9*  HCT 31.4* 31.5* 31.7* 31.5*  MCV 78.1 77.6* 77.9* 77.4*  PLT 333 220 223 168   Basic Metabolic Panel:  Recent Labs Lab 07/15/16 1800 07/16/16 0545 07/17/16 1400 07/19/16 0452  NA 135 135 136 135  K 4.3 4.0 4.1 4.4  CL 97* 97* 100* 102  CO2 25 26 29 25   GLUCOSE 152* 230* 124* 76  BUN 40* 26* 40* 41*  CREATININE 6.36* 4.62* 5.64* 5.70*  CALCIUM 8.5* 8.6* 8.6* 8.4*  MG 2.2  --   --   --   PHOS 4.7* 4.3 4.3  --    GFR: Estimated Creatinine Clearance: 10.3  mL/min (by C-G formula based on SCr of 5.7 mg/dL). Liver Function Tests:  Recent Labs Lab 07/15/16 1800 07/16/16 0545 07/17/16 1400  ALBUMIN 2.0* 1.9* 1.9*   No results for input(s): LIPASE, AMYLASE in the last 168 hours. No results for input(s): AMMONIA in the last 168 hours. Coagulation Profile:  Recent Labs Lab 07/15/16 1331  INR 1.85   Cardiac Enzymes: No results for input(s): CKTOTAL, CKMB, CKMBINDEX, TROPONINI in the last 168 hours. BNP (last 3 results) No results for input(s): PROBNP in the last 8760 hours. HbA1C: No results for input(s): HGBA1C in the last 72 hours. CBG:  Recent Labs Lab 07/18/16 1142 07/18/16 1611 07/18/16 2020 07/19/16 0740 07/19/16 1129  GLUCAP 91 90 115* 74 80   Lipid Profile: No results for input(s): CHOL, HDL, LDLCALC, TRIG, CHOLHDL, LDLDIRECT in the last 72 hours. Thyroid Function Tests: No results for input(s): TSH, T4TOTAL, FREET4, T3FREE, THYROIDAB in the last 72 hours. Anemia Panel: No results for input(s): VITAMINB12, FOLATE, FERRITIN, TIBC, IRON, RETICCTPCT in the last 72 hours. Urine analysis:     Component Value Date/Time   COLORURINE AMBER (A) 07/15/2016 1636   APPEARANCEUR TURBID (A) 07/15/2016 1636   LABSPEC 1.025 07/15/2016 1636   PHURINE 5.0 07/15/2016 1636   GLUCOSEU NEGATIVE 07/15/2016 1636   HGBUR MODERATE (A) 07/15/2016 1636   BILIRUBINUR MODERATE (A) 07/15/2016 1636   KETONESUR 15 (A) 07/15/2016 1636   PROTEINUR 100 (A) 07/15/2016 1636   NITRITE NEGATIVE 07/15/2016 1636   LEUKOCYTESUR MODERATE (A) 07/15/2016 1636   Sepsis Labs: @LABRCNTIP (procalcitonin:4,lacticidven:4)  ) Recent Results (from the past 240 hour(s))  Urine culture     Status: None   Collection Time: 07/15/16  4:36 PM  Result Value Ref Range Status   Specimen Description URINE, CATHETERIZED  Final   Special Requests NONE  Final   Culture NO GROWTH  Final   Report Status 07/16/2016 FINAL  Final  Culture, blood (Routine X 2) w Reflex to ID Panel     Status: Abnormal   Collection Time: 07/15/16  6:20 PM  Result Value Ref Range Status   Specimen Description BLOOD VENOUS CATHETER  Final   Special Requests BOTTLES DRAWN AEROBIC AND ANAEROBIC 5CC  Final   Culture  Setup Time   Final    AEROBIC BOTTLE ONLY GRAM NEGATIVE RODS CRITICAL RESULT CALLED TO, READ BACK BY AND VERIFIED WITH: TO JLEDGFORD(PHARD) BY TCLEVELAND 07/17/2016 AT 3:29    Culture PSEUDOMONAS AERUGINOSA (A)  Final   Report Status 07/19/2016 FINAL  Final   Organism ID, Bacteria PSEUDOMONAS AERUGINOSA  Final      Susceptibility   Pseudomonas aeruginosa - MIC*    CEFTAZIDIME 4 SENSITIVE Sensitive     CIPROFLOXACIN <=0.25 SENSITIVE Sensitive     GENTAMICIN <=1 SENSITIVE Sensitive     IMIPENEM 2 SENSITIVE Sensitive     PIP/TAZO 8 SENSITIVE Sensitive     CEFEPIME 2 SENSITIVE Sensitive     * PSEUDOMONAS AERUGINOSA  Culture, blood (Routine X 2) w Reflex to ID Panel     Status: None (Preliminary result)   Collection Time: 07/15/16  6:20 PM  Result Value Ref Range Status   Specimen Description BLOOD ARTERIAL CATHETER  Final   Special  Requests BOTTLES DRAWN AEROBIC AND ANAEROBIC 5CC  Final   Culture NO GROWTH 3 DAYS  Final   Report Status PENDING  Incomplete  Blood Culture ID Panel (Reflexed)     Status: Abnormal   Collection Time: 07/15/16  6:20 PM  Result  Value Ref Range Status   Enterococcus species NOT DETECTED NOT DETECTED Final   Vancomycin resistance NOT DETECTED NOT DETECTED Final   Listeria monocytogenes NOT DETECTED NOT DETECTED Final   Staphylococcus species NOT DETECTED NOT DETECTED Final   Staphylococcus aureus NOT DETECTED NOT DETECTED Final   Methicillin resistance NOT DETECTED NOT DETECTED Final   Streptococcus species NOT DETECTED NOT DETECTED Final   Streptococcus agalactiae NOT DETECTED NOT DETECTED Final   Streptococcus pneumoniae NOT DETECTED NOT DETECTED Final   Streptococcus pyogenes NOT DETECTED NOT DETECTED Final   Acinetobacter baumannii NOT DETECTED NOT DETECTED Final   Enterobacteriaceae species NOT DETECTED NOT DETECTED Final   Enterobacter cloacae complex NOT DETECTED NOT DETECTED Final   Escherichia coli NOT DETECTED NOT DETECTED Final   Klebsiella oxytoca NOT DETECTED NOT DETECTED Final   Klebsiella pneumoniae NOT DETECTED NOT DETECTED Final   Proteus species NOT DETECTED NOT DETECTED Final   Serratia marcescens NOT DETECTED NOT DETECTED Final   Carbapenem resistance NOT DETECTED NOT DETECTED Final   Haemophilus influenzae NOT DETECTED NOT DETECTED Final   Neisseria meningitidis NOT DETECTED NOT DETECTED Final   Pseudomonas aeruginosa DETECTED (A) NOT DETECTED Final    Comment: CRITICAL RESULT CALLED TO, READ BACK BY AND VERIFIED WITH: TO JLEDGFORD(PHARD) BY TCLEVELAND 07/17/2016 AT 3:29    Candida albicans NOT DETECTED NOT DETECTED Final   Candida glabrata NOT DETECTED NOT DETECTED Final   Candida krusei NOT DETECTED NOT DETECTED Final   Candida parapsilosis NOT DETECTED NOT DETECTED Final   Candida tropicalis NOT DETECTED NOT DETECTED Final  MRSA PCR Screening     Status: None     Collection Time: 07/16/16 12:37 AM  Result Value Ref Range Status   MRSA by PCR NEGATIVE NEGATIVE Final    Comment:        The GeneXpert MRSA Assay (FDA approved for NASAL specimens only), is one component of a comprehensive MRSA colonization surveillance program. It is not intended to diagnose MRSA infection nor to guide or monitor treatment for MRSA infections.   Cath Tip Culture     Status: None (Preliminary result)   Collection Time: 07/18/16  9:00 AM  Result Value Ref Range Status   Specimen Description CATH TIP  Final   Special Requests NONE  Final   Culture NO GROWTH < 24 HOURS  Final   Report Status PENDING  Incomplete      Radiology Studies: No results found.   Scheduled Meds: . atorvastatin  10 mg Oral QHS  . budesonide  0.25 mg Nebulization BID  . carvedilol  3.125 mg Oral BID WC  . ceFEPime (MAXIPIME) IV  1 g Intravenous Q24H  . famotidine  20 mg Oral QHS  . heparin  5,000 Units Subcutaneous Q8H  . insulin aspart  0-5 Units Subcutaneous QHS  . insulin aspart  0-9 Units Subcutaneous TID WC  . insulin glargine  10 Units Subcutaneous QHS  . lactose free nutrition  237 mL Oral Q1500  . lidocaine (PF)  10 mL Intradermal Once  . mirtazapine  15 mg Oral QHS  . multivitamin with minerals  1 tablet Oral QHS  . saccharomyces boulardii  250 mg Oral BID  . sertraline  25 mg Oral Daily  . sodium chloride flush  3 mL Intravenous Q12H  . vancomycin  125 mg Oral Q6H   Continuous Infusions:    LOS: 2 days   Time Spent in minutes   30 minutes  Neri Samek D.O. on 07/19/2016  at 12:21 PM  Between 7am to 7pm - Pager - [phone]  After 7pm go to [email or web address] - password [email or web address]  And look for the night coverage person covering for me after hours  [facility] Office  [phone]

## 2016-07-19 NOTE — Progress Notes (Signed)
Occupational Therapy Treatment Patient Details Name: Brandy SchoonerHelen Dudley MRN: [id number] DOB: 1951/08/09 Today's Date: 07/19/2016    History of present illness Brandy SchoonerHelen Dudley is a 65 y.o. female with medical history significant for chronic kidney disease on dialysis, history of recurrent C. Difficile colitis (initial treatment April 2017 with second treatment June 2017), umbilical hernia, OSA, diabetes, AICD in place, physical deconditioning, anemia of chronic kidney disease, Charcot foot who presented to the ER after developing shortness of breath during dialysis treatment and inability to tolerate outpatient dialysis treatment due to same; L femoral HD catheter removed on 8/22 due to infection   OT comments  Progressing slowly towards OT goals. Continue OT per plan of care.  Follow Up Recommendations  SNF    Equipment Recommendations  None recommended by OT    Recommendations for Other Services      Precautions / Restrictions Precautions Precautions: Fall Restrictions Weight Bearing Restrictions: No       Mobility Bed Mobility Overal bed mobility: Needs Assistance Bed Mobility: Rolling;Supine to Sit;Sit to Supine Rolling: Min assist   Supine to sit: Mod assist Sit to supine: Mod assist   General bed mobility comments: got her legs to EOB but needed assistance for trunk elevation; upon return to bed needed help with trunk descent and getting legs into bed.  Transfers Overall transfer level: Needs assistance Equipment used: Rolling walker (2 wheeled) Transfers: Sit to/from Stand Sit to Stand: Mod assist              Balance                                   ADL Overall ADL's : Needs assistance/impaired Eating/Feeding: Independent;Bed level   Grooming: Wash/dry face;Set up;Bed level;Sitting                   Toilet Transfer: Minimal assistance;Moderate assistance;+2 for safety/equipment;Stand-pivot Toilet Transfer Details (indicate cue type and  reason): simulated sit to stand at sidesteps along EOB Toileting- Clothing Manipulation and Hygiene: Total assistance Toileting - Clothing Manipulation Details (indicate cue type and reason): pt reports she can't use BSC so she just "goes on the pad" and the nurses clean her up     Functional mobility during ADLs: Minimal assistance;Moderate assistance;Rolling walker General ADL Comments: Patient limited by pain in her "sides" and self-report of anxiety. Kept saying, "I just want to sleep." Took increased time to perform bed mobility, sit to stand, take sidesteps, then back to bed. Encouraged pt to sit up in recliner, but she refused. Needed max encouragement to perform all tasks during treatment.       Vision                     Perception     Praxis      Cognition   Behavior During Therapy: WFL for tasks assessed/performed Overall Cognitive Status: Within Functional Limits for tasks assessed                       Extremity/Trunk Assessment               Exercises     Shoulder Instructions       General Comments      Pertinent Vitals/ Pain       Pain Assessment: Faces Faces Pain Scale: Hurts even more Pain Location: "my sides" Pain Descriptors / Indicators: Discomfort;Grimacing;Guarding Pain  Intervention(s): Limited activity within patient's tolerance;Monitored during session;Repositioned  Home Living                                          Prior Functioning/Environment              Frequency Min 2X/week     Progress Toward Goals  OT Goals(current goals can now be found in the care plan section)  Progress towards OT goals: Progressing toward goals  Acute Rehab OT Goals Patient Stated Goal: wants to get stronger  Plan Discharge plan remains appropriate    Co-evaluation    PT/OT/SLP Co-Evaluation/Treatment: Yes Reason for Co-Treatment: Complexity of the patient's impairments (multi-system involvement);For  patient/therapist safety PT goals addressed during session: Mobility/safety with mobility OT goals addressed during session: ADL's and self-care      End of Session Equipment Utilized During Treatment: Gait belt;Rolling walker   Activity Tolerance Patient limited by pain   Patient Left in bed;with call [place]/phone within reach   Nurse Communication Mobility status;Other (comment) (c/o pain even though pt adamant she did not want to inquire about pain medications)        Time: 4696-2952 OT Time Calculation (min): 43 min  Charges: OT General Charges $OT Visit: 1 Procedure OT Treatments $Therapeutic Activity: 23-37 mins  Ghina Bittinger A 07/19/2016, 12:25 PM

## 2016-07-19 NOTE — Progress Notes (Addendum)
[facility] Progress Note   Subjective: still having upper abd pain, worse w eating. CT abd showed bilat pleural effusions, "anasarca"  Vitals:   07/18/16 2114 07/19/16 0529 07/19/16 0906 07/19/16 0908  BP:  116/68  112/68  Pulse: 72 78 76 73  Resp: 18 17 18 18   Temp:  98.4 F (36.9 C)  97.8 F (36.6 C)  TempSrc:    Oral  SpO2:  95% 97% 97%  Weight:      Height:        Inpatient medications: . atorvastatin  10 mg Oral QHS  . budesonide  0.25 mg Nebulization BID  . carvedilol  3.125 mg Oral BID WC  . ceFEPime (MAXIPIME) IV  1 g Intravenous Q24H  . famotidine  20 mg Oral QHS  . heparin  5,000 Units Subcutaneous Q8H  . insulin aspart  0-5 Units Subcutaneous QHS  . insulin aspart  0-9 Units Subcutaneous TID WC  . insulin glargine  10 Units Subcutaneous QHS  . lactose free nutrition  237 mL Oral Q1500  . lidocaine (PF)  10 mL Intradermal Once  . mirtazapine  15 mg Oral QHS  . multivitamin with minerals  1 tablet Oral QHS  . saccharomyces boulardii  250 mg Oral BID  . sertraline  25 mg Oral Daily  . sodium chloride flush  3 mL Intravenous Q12H  . vancomycin  125 mg Oral Q6H     sodium chloride, acetaminophen **OR** acetaminophen, albuterol, hydrOXYzine, LORazepam, ondansetron **OR** ondansetron (ZOFRAN) IV, oxyCODONE-acetaminophen, sodium chloride flush  Exam: General: AAF NAD  Head: NCAT sclera not icteric MMM no upper teeth - left dentures at NH Neck: Supple  Lungs: dim bases bilateral crackles L>R Breathing is unlabored. Heart: RRR with S1 S2. No rub Abdomen: obese with umbilical hernia; central and RUQ tenderness; diffuse anasarca with dependent edema in flank/buttocks and upper thighs Lower extremities: + LE edema or ischemic changes, no open wounds  Neuro: A & O  X 3. Moves all extremities spontaneously. Psych:  Responds to questions appropriately with a normal affect; calmer today than on HD yesterday. Dialysis Access: left Fem cath out - dressing  intact left upper AVF maturing post ligation + bruit strongest in antecubital area  [facility] 4h 89kg  3K/ 2.25 bath  L fem cath (removed)/ LUA AVF revised 7/21 Hep 6000  Mircera 225 last given 7/29 due to missed tmts no Fe or calcitriol Recent labs: hgb 10.2 8/11 28% sat July iPTH 182 no binders P 4.4   Assessment: 1. Abdominal pain/nausea - CT abd showed normal liver/ GB/ panc/ bowel, no explanation for abd pain.  Consider EGD/ GI eval if persists, seems to be related to eating. She is losing a lot of body weight and her abd pain seems to be central to this.   2. Volume excess - 5kg under dry wt and still has vol to get off 3. [facility] TTS- HD tomorrow, max UF , get vol down. Await AVF to be ready for use after recent revision /ligation of branches on July 21st 4. Hypotension - BP's better. 110 approx systolic 5. Anemia  - hgb 10.1 - stable - continue ESA - dosing slightly lower at Aranesp 150 given Monday x 1 off schedule 6. Metabolic bone disease -  No VDRA or binders for now P iPTH ok 7. Nutrition - wt loss, as above 8. Hx C diff - risk or relapse - po Vanc started by ID 9. 1/2 + BC  pseudomonas aeruginosa - Day 2 cefepime- TDC out 8/22; ID thinks this is a contaminant (1+ cx from HD cath, peripheral cx neg, no fever/ no ^wbc) 10. Hx AICD 11. Disp - [facility] - she is very debiliated for her age   Plan - new cath and HD tomorrow, have d/w VVS   Vinson Moselleob Ulyses Panico MD [facility] pager [phone]    cell [phone] 07/19/2016, 11:34 AM    Recent Labs Lab 07/15/16 1800 07/16/16 0545 07/17/16 1400 07/19/16 0452  NA 135 135 136 135  K 4.3 4.0 4.1 4.4  CL 97* 97* 100* 102  CO2 25 26 29 25   GLUCOSE 152* 230* 124* 76  BUN 40* 26* 40* 41*  CREATININE 6.36* 4.62* 5.64* 5.70*  CALCIUM 8.5* 8.6* 8.6* 8.4*  PHOS 4.7* 4.3 4.3  --     Recent Labs Lab 07/15/16 1800 07/16/16 0545 07/17/16 1400  ALBUMIN 2.0* 1.9* 1.9*    Recent Labs Lab  07/15/16 1331 07/16/16 0546 07/17/16 1400 07/19/16 0452  WBC 13.3* 8.9 9.8 8.7  NEUTROABS 11.0*  --   --   --   HGB 10.2* 10.0* 10.1* 9.9*  HCT 31.4* 31.5* 31.7* 31.5*  MCV 78.1 77.6* 77.9* 77.4*  PLT 333 220 223 168   Iron/TIBC/Ferritin/ %Sat    Component Value Date/Time   IRON 31 04/04/2016 0748   TIBC 328 04/04/2016 0748   FERRITIN 181 04/04/2016 0748   IRONPCTSAT 9 (L) 04/04/2016 [id number]

## 2016-07-19 NOTE — Progress Notes (Signed)
Physical Therapy Treatment Patient Details Name: Brandy SchoonerHelen Dudley MRN: [id number] DOB: [birth year] Today's Date: 07/19/2016    History of Present Illness Brandy Dudley is a 65 y.o. female with medical history significant for chronic kidney disease on dialysis, history of recurrent C. Difficile colitis (initial treatment April 2017 with second treatment June 2017), umbilical hernia, OSA, diabetes, AICD in place, physical deconditioning, anemia of chronic kidney disease, Charcot foot who presented to the ER after developing shortness of breath during dialysis treatment and inability to tolerate outpatient dialysis treatment due to same; L femoral HD catheter removed on 8/22 due to infection    PT Comments    Very hesitant to get up and work on mobility today, citing being sleepy and having flank pain with moving, both sides (declining pain meds -- they make her feel not like herself)); Was able to perform one sit<>stand transfer and take small sidesteps toward [facility]; declined transfer OOB to recliner   Follow Up Recommendations  SNF     Equipment Recommendations  Other (comment) (deferred to SNF)    Recommendations for Other Services  (Consider Orthotist consult for Charcot foot)     Precautions / Restrictions Precautions Precautions: Fall Restrictions Weight Bearing Restrictions: No    Mobility  Bed Mobility Overal bed mobility: Needs Assistance Bed Mobility: Rolling;Supine to Sit;Sit to Supine Rolling: Min assist   Supine to sit: Mod assist Sit to supine: Mod assist;+2 for safety/equipment;+2 for physical assistance   General bed mobility comments: got her legs to EOB but needed assistance for trunk elevation; upon return to bed needed help with trunk descent and getting legs into bed.  Transfers Overall transfer level: Needs assistance Equipment used: Rolling walker (2 wheeled) Transfers: Sit to/from Stand Sit to Stand: Mod assist         General transfer comment: Mod assist to  power up; cues for hand placement; much encouragement to stand  Ambulation/Gait     Assistive device: Rolling walker (2 wheeled)       General Gait Details: took 5-6 sidesteps toward head of bed   Stairs            Wheelchair Mobility    Modified Rankin (Stroke Patients Only)       Balance Overall balance assessment: Needs assistance   Sitting balance-Leahy Scale: Fair       Standing balance-Leahy Scale: Poor                      Cognition Arousal/Alertness: Awake/alert Behavior During Therapy: WFL for tasks assessed/performed Overall Cognitive Status: Within Functional Limits for tasks assessed                      Exercises      General Comments        Pertinent Vitals/Pain Pain Assessment: Faces Faces Pain Scale: Hurts even more Pain Location: "my sides" Pain Descriptors / Indicators: Discomfort;Grimacing Pain Intervention(s): Limited activity within patient's tolerance;Monitored during session    Home Living                      Prior Function            PT Goals (current goals can now be found in the care plan section) Acute Rehab PT Goals Patient Stated Goal: wants to get stronger PT Goal Formulation: With patient Time For Goal Achievement: 07/30/16 Potential to Achieve Goals: Good Progress towards PT goals: Progressing toward goals (though quite slowly)  Frequency  Min 2X/week    PT Plan Current plan remains appropriate    Co-evaluation PT/OT/SLP Co-Evaluation/Treatment: Yes Reason for Co-Treatment: Complexity of the patient's impairments (multi-system involvement);For patient/therapist safety PT goals addressed during session: Mobility/safety with mobility OT goals addressed during session: ADL's and self-care     End of Session Equipment Utilized During Treatment: Gait belt Activity Tolerance: Patient tolerated treatment well Patient left: in bed;with call Marulanda/phone within reach     Time:  0939-1022 PT Time Calculation (min) (ACUTE ONLY): 43 min  Charges:  $Therapeutic Activity: 8-22 mins                    G Codes:      Olen Pel 07/19/2016, 12:54 PM   Van Clines, [place]  Acute Rehabilitation Services Pager [phone] Office [phone]

## 2016-07-19 NOTE — Clinical Social Work Note (Signed)
Clinical Social Work Assessment  Patient Details  Name: Brandy Dudley MRN: [id number] Date of Birth: 1951/09/17  Date of referral:  07/17/16               Reason for consult:  Facility Placement (Patient from [facility])                Permission sought to share information with:  Family Supports Permission granted to share information::  Yes, Verbal Permission Granted  Name::     [name]  Agency::     Relationship::  Sister  Contact Information:  [phone]  Housing/Transportation Living arrangements for the past 2 months:  Skilled Nursing Facility Source of Information:  Patient Patient Interpreter Needed:  None Criminal Activity/Legal Involvement Pertinent to Current Situation/Hospitalization:  No - Comment as needed Significant Relationships:  Siblings (Patient has 3 sister listed in contact information) Lives with:  Facility Resident [facility]) Do you feel safe going back to the place where you live?  Yes Need for family participation in patient care:  Yes (Comment)  Care giving concerns:  Patient expressed no concerns regarding care received at skilled facility prior to admission back to hospital.   Social Worker assessment / plan:  CSW talked with patient on 8/22 regarding discharge plan. Ms. Wynes was sitting up in bed and was alert, oriented and open to talking with CSW.  Patient confirmed that she came from [facility] where she was receiving rehab. When asked, Ms. Daigrepont indicated that she does plan to return to SNF and continue her rehab.  Ms. Diglio also gave CSW permission to contact her sister [name] when she is ready for discharge.   Employment status:  Retired Health and safety inspector:  Medicaid In [place], Teacher, English as a foreign language ([employer] P[employer] Medicare) PT Recommendations:  Skilled Nursing Facility Information / Referral to community resources:  Skilled Nursing Facility  Patient/Family's Response to care:  No concerns expressed regarding care during  hospitalization.  Patient/Family's Understanding of and Emotional Response to Diagnosis, Current Treatment, and Prognosis:  Not discussed.  Emotional Assessment Appearance:  Appears stated age Attitude/Demeanor/Rapport:  Other (Appropriate) Affect (typically observed):  Pleasant, Appropriate, Other (Engaged in conversations with CSW) Orientation:  Oriented to Self, Oriented to Place, Oriented to  Time, Oriented to Situation Alcohol / Substance use:  Tobacco Use, Alcohol Use, Illicit Drugs (Patient reports that she quit smoking on 03/03/06 and does not drink or use illicit drugs) Psych involvement (Current and /or in the community):  No (Comment)  Discharge Needs  Concerns to be addressed:  Discharge Planning Concerns Readmission within the last 30 days:  Yes Current discharge risk:  None Barriers to Discharge:  No Barriers Identified   Cristobal Goldmann, LCSW 07/19/2016, 12:38 PM

## 2016-07-19 NOTE — Consult Note (Addendum)
[facility] Reason for consult: abdominal pain nausea and vomiting in history of C. difficile Referring Physician: [facility] hospitalist. PCP: Dr. Bubba Camp. Primary G.I.: Dr. Jeannette Corpus is an 65 y.o. female.  HPI: an extremely complicated woman with multiple medical problems. She has end-stage renal disease and is on dialysis. 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 She is diabetic, has COPD, history congestive heart failure, history of DVT normally is anticoagulated, OSA and most recently has had C. difficile colitis with at least 3 treatments and is felt to have refractory C. difficile. She has been seen recently by my partner, Dr. Paulita Fujita for recurrent C. difficile as well as elevated alkaline phosphatase, societies and a history of gallstones on ultrasound according to the patient's history. This ultrasound was done through [facility] and I cannot find a record of it in the [facility] system. The ultrasound is referenced by her referral notes indicating biliary sludge and abnormal liver test. She notes 2 to 3 week history of nausea and right upper quadrant pain. She was admitted with the symptoms and has been seen by ID who started her own cefepime for positive blood cultures for Pseudomonas and resumed oral vancomycin for treatment of her refractory C. difficile. She reports that she is not chronically having any diarrhea. Were asked to see her regarding her continued ongoing pain which seems to be present all the time but waxes and wanes in intensity. She is unable to located any particular part in her abdomen. It's not clear that food or bowel movement really seems to help. She notes that she's had this pain now for only for approximately 2 to 3 weeks although it appears that she has had a long history of similar symptoms according to the records. She also has had problems with  dialysis access and is going tomorrow to have a dialysis catheter replaced the timing of the surgery is unclear. The patient was seen by Dr. Paulita Fujita last week for abdominal pain and nausea and vomiting. The ultrasound that had been reference could not be found or obtained from [facility] said Dr. Paulita Fujita ordered another ultrasound is an outpatient to evaluate her biliary system. This was ordered but does not appear yet to have been done. Labs obtain last week revealed a normal white count, creatinine 5.6, elevated ALP and slight elevation of total bilirubin. Lipase and transaminases were normal. The patient had been on famotidine and this was increased. The patient was sent to the emergency room after becoming short of breath on dialysis and work up so far his determined that she has a positive blood culture for Pseudomonas that she was volume overload. Blood culture was negative for other pathogens.  Past Medical History:  Diagnosis Date  . AICD (automatic cardioverter/defibrillator) present   . Anemia   . C. difficile colitis   . CHF (congestive heart failure) (HCC)    systolic  . COPD (chronic obstructive pulmonary disease) ([place])   . Coronary artery disease   . Diabetes mellitus ([place])   . DVT (deep venous thrombosis) (HCC)    on coumadin  . ESRD (end stage renal disease) ([place])    tues/thurs/sat dialysis  . Hypertension   . Obstructive sleep apnea    no cpap  . Renal disorder     Past Surgical History:  Procedure Laterality Date  . AV FISTULA PLACEMENT Left 04/03/2016   Procedure:  ARTERIOVENOUS (AV) FISTULA CREATION;  Surgeon: Rosetta Posner, MD;  Location: [place];  Service: Vascular;  Laterality: Left;  . FISTULA SUPERFICIALIZATION Left 06/16/2016   Procedure: SUPERFICIALIZATION OF LEFT ARM BRACHIOCEPHALIC ARTERIOVENOUS FISTULA;  Surgeon: Angelia Mould, MD;  Location: [place];  Service: Vascular;  Laterality: Left;  . INSERTION OF DIALYSIS CATHETER Left 04/03/2016   Procedure: INSERTION  OF DIALYSIS CATHETER;  Surgeon: Rosetta Posner, MD;  Location: [place];  Service: Vascular;  Laterality: Left;  . LIGATION OF COMPETING BRANCHES OF ARTERIOVENOUS FISTULA Left 06/16/2016   Procedure: LIGATION OF COMPETING BRANCHES OF left brachiocephalic ARTERIOVENOUS FISTULA;  Surgeon: Angelia Mould, MD;  Location: [place];  Service: Vascular;  Laterality: Left;  . REVISION OF ARTERIOVENOUS GORETEX GRAFT Left 06/16/2016   Procedure: REVISION OF LEFT BRACHIOCEPHALIC ARTERIOVENOUS FISTULA WITH RESECTION OF REDUNDANT SECTION;  Surgeon: Angelia Mould, MD;  Location: [place];  Service: Vascular;  Laterality: Left;    No family history on file.  Social History:  reports that she quit smoking about 10 years ago. Her smoking use included Cigarettes. She has never used smokeless tobacco. She reports that she does not drink alcohol or use drugs.  Allergies:  Allergies  Allergen Reactions  . Entresto [Sacubitril-Valsartan] Other (See Comments)     unknown reaction  . Sulfa Antibiotics Itching    Medications; Prior to Admission medications   Medication Sig Start Date End Date Taking? Authorizing Provider  acetaminophen (TYLENOL) 325 MG tablet Take 325 mg by mouth every 6 (six) hours as needed for mild pain or fever (For fever >99.5).    Yes Historical Provider, MD  albuterol (PROAIR HFA) 108 (90 Base) MCG/ACT inhaler Inhale 2 puffs into the lungs every 6 (six) hours as needed for wheezing or shortness of breath.    Yes Historical Provider, MD  Amino Acids-Protein Hydrolys (FEEDING SUPPLEMENT, PRO-STAT SUGAR FREE 64,) LIQD Take 30 mLs by mouth daily.   Yes Historical Provider, MD  atorvastatin (LIPITOR) 10 MG tablet Take 10 mg by mouth at bedtime.    Yes Historical Provider, MD  Budesonide (PULMICORT FLEXHALER) 90 MCG/ACT inhaler Inhale 2 puffs into the lungs 2 (two) times daily.   Yes Historical Provider, MD  carvedilol (COREG) 3.125 MG tablet Take 3.125 mg by mouth 2 (two) times daily with a  meal. Hold for SBP <119 or HR <60   Yes Historical Provider, MD  famotidine (PEPCID) 20 MG tablet Take 20 mg by mouth 2 (two) times daily.    Yes Historical Provider, MD  hydrOXYzine (ATARAX/VISTARIL) 50 MG tablet Take 50 mg by mouth every 6 (six) hours as needed for itching.   Yes Historical Provider, MD  insulin glargine (LANTUS) 100 unit/mL SOPN Inject 0.1 mLs (10 Units total) into the skin at bedtime. 05/09/16  Yes Orson Eva, MD  insulin lispro (HUMALOG) 100 UNIT/ML injection Inject 0-10 Units into the skin 3 (three) times daily with meals. CBG 0-59 Hypoglycemic Protocol, 60-149 0 units 150-250 5 units, 251-300 8 units, 301-350 10 units, >350 call MD   Yes Historical Provider, MD  Inulin (FIBER CHOICE PO) Take 2 tablets by mouth daily as needed (constipation).   Yes Historical Provider, MD  LORazepam (ATIVAN) 0.5 MG tablet Take 0.5 mg by mouth 2 (two) times daily as needed for anxiety.   Yes Historical Provider, MD  mirtazapine (REMERON) 15 MG tablet Take 1 tablet (15 mg total) by mouth at bedtime. 07/05/16  Yes Dinah C Ngetich, NP  Multiple Vitamins-Minerals (CERTAVITE/ANTIOXIDANTS)  TABS Take 1 tablet by mouth at bedtime.   Yes Historical Provider, MD  Nutritional Supplements (NUTRITIONAL SUPPLEMENT PO) Take 90 mLs by mouth 2 (two) times daily. [employer]   Yes Historical Provider, MD  oxyCODONE-acetaminophen (ROXICET) 5-325 MG tablet Take 1-2 tablets by mouth every 4 (four) hours as needed. Patient taking differently: Take 1-2 tablets by mouth every 4 (four) hours as needed (mild pain (1 tablet )/ severe pain (2 tablets)).  06/19/16  Yes Lauree Chandler, NP  OXYGEN Inhale 2 L into the lungs as needed.    Yes Historical Provider, MD  saccharomyces boulardii (FLORASTOR) 250 MG capsule Take 250 mg by mouth 2 (two) times daily. For recurrent C. Diff.  Take 1 tablet (250 mg) by mouth twice daily until 2 weeks after completion of antibiotics (stop date 08/05/16   Yes Historical Provider, MD  sertraline  (ZOLOFT) 25 MG tablet Take 25 mg by mouth at bedtime.    Yes Historical Provider, MD  vancomycin (VANCOCIN) 50 mg/mL oral solution Take 125 mg by mouth See admin instructions. Tapered course started 06/25/16: take 125 mg daily for 7 days, then take 125 mg every other day for 7 days, then take 125 mg every 3 days at 8am for 14 days, then stop   Yes Historical Provider, MD   . atorvastatin  10 mg Oral QHS  . budesonide  0.25 mg Nebulization BID  . carvedilol  3.125 mg Oral BID WC  . ceFEPime (MAXIPIME) IV  1 g Intravenous Q24H  . famotidine  20 mg Oral QHS  . heparin  5,000 Units Subcutaneous Q8H  . insulin aspart  0-5 Units Subcutaneous QHS  . insulin aspart  0-9 Units Subcutaneous TID WC  . insulin glargine  10 Units Subcutaneous QHS  . lactose free nutrition  237 mL Oral Q1500  . lidocaine (PF)  10 mL Intradermal Once  . mirtazapine  15 mg Oral QHS  . multivitamin with minerals  1 tablet Oral QHS  . saccharomyces boulardii  250 mg Oral BID  . sertraline  25 mg Oral Daily  . sodium chloride flush  3 mL Intravenous Q12H  . vancomycin  125 mg Oral Q6H   PRN Meds sodium chloride, acetaminophen **OR** acetaminophen, albuterol, hydrOXYzine, LORazepam, ondansetron **OR** ondansetron (ZOFRAN) IV, oxyCODONE-acetaminophen, sodium chloride flush Results for orders placed or performed during the hospital encounter of 07/15/16 (from the past 48 hour(s))  Glucose, capillary     Status: Abnormal   Collection Time: 07/17/16  5:49 PM  Result Value Ref Range   Glucose-Capillary 127 (H) 65 - 99 mg/dL  Glucose, capillary     Status: Abnormal   Collection Time: 07/17/16  9:24 PM  Result Value Ref Range   Glucose-Capillary 119 (H) 65 - 99 mg/dL  Glucose, capillary     Status: None   Collection Time: 07/18/16  7:46 AM  Result Value Ref Range   Glucose-Capillary 96 65 - 99 mg/dL  Cath Tip Culture     Status: None (Preliminary result)   Collection Time: 07/18/16  9:00 AM  Result Value Ref Range    Specimen Description CATH TIP    Special Requests NONE    Culture NO GROWTH < 24 HOURS    Report Status PENDING   Glucose, capillary     Status: None   Collection Time: 07/18/16 11:42 AM  Result Value Ref Range   Glucose-Capillary 91 65 - 99 mg/dL  Culture, blood (Routine X 2) w Reflex to ID Panel  Status: None (Preliminary result)   Collection Time: 07/18/16  1:15 PM  Result Value Ref Range   Specimen Description BLOOD RIGHT HAND    Special Requests BOTTLES DRAWN AEROBIC ONLY 5CC    Culture NO GROWTH < 24 HOURS    Report Status PENDING   Culture, blood (Routine X 2) w Reflex to ID Panel     Status: None (Preliminary result)   Collection Time: 07/18/16  1:21 PM  Result Value Ref Range   Specimen Description BLOOD RIGHT HAND    Special Requests BOTTLES DRAWN AEROBIC AND ANAEROBIC 4CC EA    Culture NO GROWTH < 24 HOURS    Report Status PENDING   Glucose, capillary     Status: None   Collection Time: 07/18/16  4:11 PM  Result Value Ref Range   Glucose-Capillary 90 65 - 99 mg/dL  Glucose, capillary     Status: Abnormal   Collection Time: 07/18/16  8:20 PM  Result Value Ref Range   Glucose-Capillary 115 (H) 65 - 99 mg/dL  Basic metabolic panel     Status: Abnormal   Collection Time: 07/19/16  4:52 AM  Result Value Ref Range   Sodium 135 135 - 145 mmol/L   Potassium 4.4 3.5 - 5.1 mmol/L   Chloride 102 101 - 111 mmol/L   CO2 25 22 - 32 mmol/L   Glucose, Bld 76 65 - 99 mg/dL   BUN 41 (H) 6 - 20 mg/dL   Creatinine, Ser 5.70 (H) 0.44 - 1.00 mg/dL   Calcium 8.4 (L) 8.9 - 10.3 mg/dL   GFR calc non Af Amer 7 (L) >60 mL/min   GFR calc Af Amer 8 (L) >60 mL/min    Comment: (NOTE) The eGFR has been calculated using the CKD EPI equation. This calculation has not been validated in all clinical situations. eGFR's persistently <60 mL/min signify possible Chronic Kidney Disease.    Anion gap 8 5 - 15  CBC     Status: Abnormal   Collection Time: 07/19/16  4:52 AM  Result Value Ref  Range   WBC 8.7 4.0 - 10.5 K/uL   RBC 4.07 3.87 - 5.11 MIL/uL   Hemoglobin 9.9 (L) 12.0 - 15.0 g/dL   HCT 31.5 (L) 36.0 - 46.0 %   MCV 77.4 (L) 78.0 - 100.0 fL   MCH 24.3 (L) 26.0 - 34.0 pg   MCHC 31.4 30.0 - 36.0 g/dL   RDW 18.7 (H) 11.5 - 15.5 %   Platelets 168 150 - 400 K/uL  Glucose, capillary     Status: None   Collection Time: 07/19/16  7:40 AM  Result Value Ref Range   Glucose-Capillary 74 65 - 99 mg/dL  Glucose, capillary     Status: None   Collection Time: 07/19/16 11:29 AM  Result Value Ref Range   Glucose-Capillary 80 65 - 99 mg/dL    No results found. ROS: Constitutional:Positive for fatigue and weight loss and inability to eat.  HEENT:negative  Cardiovascular:no chest pain  Respiratory:Mark shortness of breath due to buy vascular overload as noted above  KG:URKYHC nausea and diffuse abdominal paiin GU:no dysuria  Musculoskeletal:negative  Neuro/Psychiatric:negative  Endocrine/Heme:negative            Blood pressure 112/68, pulse 73, temperature 97.8 F (36.6 C), temperature source Oral, resp. rate 18, height '5\' 4"'  (1.626 m), weight 83 kg (182 lb 15.7 oz), SpO2 97 %.  Physical exam:   General--talkative African-American female no distress  ENT--nonicteric   Heart--no  M/G Lungs--rales  Abdomen--unbilical hernia somewhat tender, +ascites, mild diffuse tenderness few BSs Psych--patient is alert has somewhat rambling speech.   Assessment: 1. Diffuse abdominal pain. She has a tender umbilical hernia and may have some sludge in her gallbladder that is been reported in the records on previous ultrasound which at this point in time is not available. I have requested it from [facility]. This could be from the ascitic fluid. She is on antibiotics. Always concern for mesenteric ischemia 2. Positive blood culture for Pseudomonas source unclear 3. Refractory C. difficile colitis. Back on oral vancomycin, ID on board 3. ESRD. On hemodialysis going tomorrow  to have her dialysis catheter replaced 4. History congestive heart failure has AICD in place  Plan: 1. Have requested ultrasound report from [facility]. 2. She is going for graft replacement tomorrow. If her pain is still present to the same degree tomorrow we may need to do a CT of her abdomen. She may need EGD etc. We will follow with you.   Maigen Mozingo JR,Jaxon Flatt L 07/19/2016, 3:25 PM   This note was created using voice recognition software and minor errors may Have occurred unintentionally. Pager: [phone] If no answer or after hours call [phone]   Note: I got the [place] report from Dr Bubba Camp and the [place] showed sludge in GB w/o dilated ducts and ascites. Copy in paper binder.

## 2016-07-19 NOTE — Progress Notes (Signed)
Patient ID: Brandy Dudley, female   DOB: [birth year], 65 y.o.   MRN: [id number]          [facility]    Date of Admission:  07/15/2016           Day 4 cefepime        Day 3 oral vancomycin  Principal Problem:   Positive blood culture Active Problems:   History of Clostridium difficile colitis   RUQ pain   Physical deconditioning   Coronary artery disease involving native coronary artery of native heart without angina pectoris   Essential hypertension, malignant   Diabetes mellitus type 2 in obese (HCC)   COPD (chronic obstructive pulmonary disease) (HCC)   Obstructive sleep apnea   AICD (automatic cardioverter/defibrillator) present   ESRD on dialysis (HCC)   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 atorvastatin  10 mg Oral QHS  . budesonide  0.25 mg Nebulization BID  . carvedilol  3.125 mg Oral BID WC  . ceFEPime (MAXIPIME) IV  1 g Intravenous Q24H  . famotidine  20 mg Oral QHS  . heparin  5,000 Units Subcutaneous Q8H  . insulin aspart  0-5 Units Subcutaneous QHS  . insulin aspart  0-9 Units Subcutaneous TID WC  . insulin glargine  10 Units Subcutaneous QHS  . lactose free nutrition  237 mL Oral Q1500  . lidocaine (PF)  10 mL Intradermal Once  . mirtazapine  15 mg Oral QHS  . multivitamin with minerals  1 tablet Oral QHS  . saccharomyces boulardii  250 mg Oral BID  . sertraline  25 mg Oral Daily  . sodium chloride flush  3 mL Intravenous Q12H  . vancomycin  125 mg Oral Q6H    SUBJECTIVE: She is feeling a little bit better but is still having persistent abdominal pain. She rates the pain  as 7 out of 10. She says that it is constant. It can get worse sometimes when she tries to eat. She has been having some nausea with no vomiting or diarrhea.  Review of Systems: Review of Systems  Constitutional: Negative for chills, diaphoresis and fever.  Gastrointestinal: Positive for abdominal pain and nausea. Negative for constipation, diarrhea and vomiting.    Past Medical History:  Diagnosis Date  . AICD (automatic cardioverter/defibrillator) present   . Anemia   . C. difficile colitis   . CHF (congestive heart failure) (HCC)    systolic  . COPD (chronic obstructive pulmonary disease) (HCC)   . Coronary artery disease   . Diabetes mellitus (HCC)   . DVT (deep venous thrombosis) (HCC)    on coumadin  . ESRD (end stage renal disease) (HCC)    tues/thurs/sat dialysis  . Hypertension   . Obstructive sleep apnea    no cpap  . Renal disorder     Social History  Substance Use Topics  . Smoking status: Former Smoker    Types: Cigarettes    Quit date: 03/03/2006  . Smokeless tobacco: Never Used  . Alcohol use No    No family history on file. Allergies  Allergen Reactions  . Entresto [Sacubitril-Valsartan] Other (  See Comments)     unknown reaction  . Sulfa Antibiotics Itching    OBJECTIVE: Vitals:   07/18/16 2114 07/19/16 0529 07/19/16 0906 07/19/16 0908  BP:  116/68  112/68  Pulse: 72 78 76 73  Resp: 18 17 18 18   Temp:  98.4 F (36.9 C)  97.8 F (36.6 C)  TempSrc:    Oral  SpO2:  95% 97% 97%  Weight:      Height:       Body mass index is 31.41 kg/m.  Physical Exam  Constitutional: She is oriented to person, place, and time.  She is alert and in no distress.  Abdominal: Soft.  Mild diffuse tenderness with palpation.  Neurological: She is alert and oriented to person, place, and time.  Psychiatric: Mood and affect normal.    Lab Results Lab Results  Component Value Date   WBC 8.7 07/19/2016   HGB 9.9 (L) 07/19/2016   HCT 31.5 (L) 07/19/2016   MCV  77.4 (L) 07/19/2016   PLT 168 07/19/2016    Lab Results  Component Value Date   CREATININE 5.70 (H) 07/19/2016   BUN 41 (H) 07/19/2016   NA 135 07/19/2016   K 4.4 07/19/2016   CL 102 07/19/2016   CO2 25 07/19/2016    Lab Results  Component Value Date   ALT 8 06/01/2016   AST 15 06/01/2016   ALKPHOS 234 (A) 06/01/2016   BILITOT 1.5 (H) 05/06/2016     Microbiology: Recent Results (from the past 240 hour(s))  Urine culture     Status: None   Collection Time: 07/15/16  4:36 PM  Result Value Ref Range Status   Specimen Description URINE, CATHETERIZED  Final   Special Requests NONE  Final   Culture NO GROWTH  Final   Report Status 07/16/2016 FINAL  Final  Culture, blood (Routine X 2) w Reflex to ID Panel     Status: Abnormal   Collection Time: 07/15/16  6:20 PM  Result Value Ref Range Status   Specimen Description BLOOD VENOUS CATHETER  Final   Special Requests BOTTLES DRAWN AEROBIC AND ANAEROBIC 5CC  Final   Culture  Setup Time   Final    AEROBIC BOTTLE ONLY GRAM NEGATIVE RODS CRITICAL RESULT CALLED TO, READ BACK BY AND VERIFIED WITH: TO JLEDGFORD(PHARD) BY TCLEVELAND 07/17/2016 AT 3:29    Culture PSEUDOMONAS AERUGINOSA (A)  Final   Report Status 07/19/2016 FINAL  Final   Organism ID, Bacteria PSEUDOMONAS AERUGINOSA  Final      Susceptibility   Pseudomonas aeruginosa - MIC*    CEFTAZIDIME 4 SENSITIVE Sensitive     CIPROFLOXACIN <=0.25 SENSITIVE Sensitive     GENTAMICIN <=1 SENSITIVE Sensitive     IMIPENEM 2 SENSITIVE Sensitive     PIP/TAZO 8 SENSITIVE Sensitive     CEFEPIME 2 SENSITIVE Sensitive     * PSEUDOMONAS AERUGINOSA  Culture, blood (Routine X 2) w Reflex to ID Panel     Status: None (Preliminary result)   Collection Time: 07/15/16  6:20 PM  Result Value Ref Range Status   Specimen Description BLOOD ARTERIAL CATHETER  Final   Special Requests BOTTLES DRAWN AEROBIC AND ANAEROBIC 5CC  Final   Culture NO GROWTH 4 DAYS  Final   Report Status PENDING  Incomplete    Blood Culture ID Panel (Reflexed)     Status: Abnormal   Collection Time: 07/15/16  6:20 PM  Result Value Ref Range Status   Enterococcus species NOT DETECTED NOT DETECTED  Final   Vancomycin resistance NOT DETECTED NOT DETECTED Final   Listeria monocytogenes NOT DETECTED NOT DETECTED Final   Staphylococcus species NOT DETECTED NOT DETECTED Final   Staphylococcus aureus NOT DETECTED NOT DETECTED Final   Methicillin resistance NOT DETECTED NOT DETECTED Final   Streptococcus species NOT DETECTED NOT DETECTED Final   Streptococcus agalactiae NOT DETECTED NOT DETECTED Final   Streptococcus pneumoniae NOT DETECTED NOT DETECTED Final   Streptococcus pyogenes NOT DETECTED NOT DETECTED Final   Acinetobacter baumannii NOT DETECTED NOT DETECTED Final   Enterobacteriaceae species NOT DETECTED NOT DETECTED Final   Enterobacter cloacae complex NOT DETECTED NOT DETECTED Final   Escherichia coli NOT DETECTED NOT DETECTED Final   Klebsiella oxytoca NOT DETECTED NOT DETECTED Final   Klebsiella pneumoniae NOT DETECTED NOT DETECTED Final   Proteus species NOT DETECTED NOT DETECTED Final   Serratia marcescens NOT DETECTED NOT DETECTED Final   Carbapenem resistance NOT DETECTED NOT DETECTED Final   Haemophilus influenzae NOT DETECTED NOT DETECTED Final   Neisseria meningitidis NOT DETECTED NOT DETECTED Final   Pseudomonas aeruginosa DETECTED (A) NOT DETECTED Final    Comment: CRITICAL RESULT CALLED TO, READ BACK BY AND VERIFIED WITH: TO JLEDGFORD(PHARD) BY TCLEVELAND 07/17/2016 AT 3:29    Candida albicans NOT DETECTED NOT DETECTED Final   Candida glabrata NOT DETECTED NOT DETECTED Final   Candida krusei NOT DETECTED NOT DETECTED Final   Candida parapsilosis NOT DETECTED NOT DETECTED Final   Candida tropicalis NOT DETECTED NOT DETECTED Final  MRSA PCR Screening     Status: None   Collection Time: 07/16/16 12:37 AM  Result Value Ref Range Status   MRSA by PCR NEGATIVE NEGATIVE Final    Comment:         The GeneXpert MRSA Assay (FDA approved for NASAL specimens only), is one component of a comprehensive MRSA colonization surveillance program. It is not intended to diagnose MRSA infection nor to guide or monitor treatment for MRSA infections.   Cath Tip Culture     Status: None (Preliminary result)   Collection Time: 07/18/16  9:00 AM  Result Value Ref Range Status   Specimen Description CATH TIP  Final   Special Requests NONE  Final   Culture NO GROWTH < 24 HOURS  Final   Report Status PENDING  Incomplete  Culture, blood (Routine X 2) w Reflex to ID Panel     Status: None (Preliminary result)   Collection Time: 07/18/16  1:15 PM  Result Value Ref Range Status   Specimen Description BLOOD RIGHT HAND  Final   Special Requests BOTTLES DRAWN AEROBIC ONLY 5CC  Final   Culture NO GROWTH < 24 HOURS  Final   Report Status PENDING  Incomplete  Culture, blood (Routine X 2) w Reflex to ID Panel     Status: None (Preliminary result)   Collection Time: 07/18/16  1:21 PM  Result Value Ref Range Status   Specimen Description BLOOD RIGHT HAND  Final   Special Requests BOTTLES DRAWN AEROBIC AND ANAEROBIC 4CC EA  Final   Culture NO GROWTH < 24 HOURS  Final   Report Status PENDING  Incomplete     ASSESSMENT: I suspect that the one positive blood culture that grew Pseudomonas is an insignificant contaminant given that it was drawn through her old femoral hemodialysis catheter and her peripheral stick blood culture was negative. She is very worried about developing recurrent C. difficile colitis. I will stop cefepime now and plan on continuing oral vancomycin for  1 more week. I do not know the cause of her current abdominal pain.  PLAN: 1. Discontinue cefepime 2. Continue oral vancomycin for 1 more week and attempt to prevent a recurrence of C. difficile colitis  Cliffton Asters, MD [facility] [phone] pager   [phone] cell 07/19/2016,  4:58 PM

## 2016-07-19 NOTE — Progress Notes (Signed)
  Progress Note    07/19/2016 7:59 AM * No surgery found *  Subjective:  No issues overnight, remains disoriented to time  Vitals:   07/18/16 2114 07/19/16 0529  BP:  116/68  Pulse: 72 78  Resp: 18 17  Temp:  98.4 F (36.9 C)    Physical Exam: Left upper arm avf palpable at antecubitum Fain bruit on proximal arm  CBC    Component Value Date/Time   WBC 8.7 07/19/2016 0452   RBC 4.07 07/19/2016 0452   HGB 9.9 (L) 07/19/2016 0452   HCT 31.5 (L) 07/19/2016 0452   PLT 168 07/19/2016 0452   MCV 77.4 (L) 07/19/2016 0452   MCH 24.3 (L) 07/19/2016 0452   MCHC 31.4 07/19/2016 0452   RDW 18.7 (H) 07/19/2016 0452   LYMPHSABS 1.1 07/15/2016 1331   MONOABS 0.9 07/15/2016 1331   EOSABS 0.3 07/15/2016 1331   BASOSABS 0.0 07/15/2016 1331    BMET    Component Value Date/Time   NA 135 07/19/2016 0452   NA 143 06/01/2016   K 4.4 07/19/2016 0452   CL 102 07/19/2016 0452   CO2 25 07/19/2016 0452   GLUCOSE 76 07/19/2016 0452   BUN 41 (H) 07/19/2016 0452   BUN 52 (A) 06/01/2016   CREATININE 5.70 (H) 07/19/2016 0452   CALCIUM 8.4 (L) 07/19/2016 0452   GFRNONAA 7 (L) 07/19/2016 0452   GFRAA 8 (L) 07/19/2016 0452    INR    Component Value Date/Time   INR 1.85 07/15/2016 1331     Intake/Output Summary (Last 24 hours) at 07/19/16 0759 Last data filed at 07/19/16 0500  Gross per 24 hour  Intake              650 ml  Output                0 ml  Net              650 ml     Assessment:  65 y.o. female is s/p tunneled catheter removal for + blood cultures, recent revision of left arm av fistula with faint bruit on proximal arm  Plan: Will plan shuntogram and replace tunneled catheter when ok per id and nephrology F/u duplex from yesterday   Amma Crear C. Randie Heinz, MD [facility] Office: [phone] Pager: [phone]  07/19/2016 7:59 AM

## 2016-07-20 ENCOUNTER — Encounter (HOSPITAL_COMMUNITY)
Admission: EM | Disposition: A | Discharge status: Skilled Nursing Facility | Payer: Self-pay | Source: Home / Self Care | Attending: Internal Medicine

## 2016-07-20 ENCOUNTER — Encounter (HOSPITAL_COMMUNITY): Payer: Self-pay | Admitting: Vascular Surgery

## 2016-07-20 ENCOUNTER — Inpatient Hospital Stay (HOSPITAL_COMMUNITY): Payer: Medicare PPO

## 2016-07-20 DIAGNOSIS — N185 Chronic kidney disease, stage 5: Secondary | ICD-10-CM

## 2016-07-20 DIAGNOSIS — K429 Umbilical hernia without obstruction or gangrene: Secondary | ICD-10-CM

## 2016-07-20 HISTORY — PX: PERIPHERAL VASCULAR CATHETERIZATION: SHX172C

## 2016-07-20 LAB — GLUCOSE, CAPILLARY
GLUCOSE-CAPILLARY: 49 mg/dL — AB (ref 65–99)
GLUCOSE-CAPILLARY: 51 mg/dL — AB (ref 65–99)
Glucose-Capillary: 75 mg/dL (ref 65–99)
Glucose-Capillary: 121 mg/dL — ABNORMAL HIGH (ref 65–99)
Glucose-Capillary: 83 mg/dL (ref 65–99)

## 2016-07-20 LAB — BASIC METABOLIC PANEL
ANION GAP: 10 (ref 5–15)
BUN: 48 mg/dL — ABNORMAL HIGH (ref 6–20)
CALCIUM: 8.6 mg/dL — AB (ref 8.9–10.3)
CO2: 25 mmol/L (ref 22–32)
Chloride: 101 mmol/L (ref 101–111)
Creatinine, Ser: 6.06 mg/dL — ABNORMAL HIGH (ref 0.44–1.00)
GFR, EST AFRICAN AMERICAN: 8 mL/min — AB (ref >60)
GFR, EST NON AFRICAN AMERICAN: 7 mL/min — AB (ref >60)
GLUCOSE: 54 mg/dL — AB (ref 65–99)
Potassium: 4 mmol/L (ref 3.5–5.1)
SODIUM: 136 mmol/L (ref 135–145)

## 2016-07-20 LAB — CULTURE, BLOOD (ROUTINE X 2): CULTURE: NO GROWTH

## 2016-07-20 LAB — CBC
HCT: 32.5 % — ABNORMAL LOW (ref 36.0–46.0)
Hemoglobin: 10.2 g/dL — ABNORMAL LOW (ref 12.0–15.0)
MCH: 24.8 pg — ABNORMAL LOW (ref 26.0–34.0)
MCHC: 31.4 g/dL (ref 30.0–36.0)
MCV: 78.9 fL (ref 78.0–100.0)
PLATELETS: 192 10*3/uL (ref 150–400)
RBC: 4.12 MIL/uL (ref 3.87–5.11)
RDW: 19.2 % — AB (ref 11.5–15.5)
WBC: 9.5 10*3/uL (ref 4.0–10.5)

## 2016-07-20 SURGERY — A/V SHUNTOGRAM/FISTULAGRAM
Anesthesia: LOCAL

## 2016-07-20 MED ORDER — HEPARIN SODIUM (PORCINE) 1000 UNIT/ML IJ SOLN
INTRAMUSCULAR | Status: AC
  Filled 2016-07-20: qty 1

## 2016-07-20 MED ORDER — HEPARIN SODIUM (PORCINE) 1000 UNIT/ML DIALYSIS: 3000.0000 [IU] | INTRAMUSCULAR | Status: DC | PRN

## 2016-07-20 MED ORDER — IODIXANOL 320 MG/ML IV SOLN
INTRAVENOUS | Status: DC | PRN
  Administered 2016-07-20: 30 mL via INTRAVENOUS

## 2016-07-20 MED ORDER — LIDOCAINE HCL (PF) 1 % IJ SOLN
INTRAMUSCULAR | Status: AC
  Filled 2016-07-20: qty 30

## 2016-07-20 MED ORDER — LIDOCAINE HCL (PF) 1 % IJ SOLN
INTRAMUSCULAR | Status: DC | PRN
  Administered 2016-07-20: 30 mL
  Administered 2016-07-20: 2 mL

## 2016-07-20 MED ORDER — DEXTROSE 50 % IV SOLN
INTRAVENOUS | Status: AC
Start: 2016-07-20 — End: 2016-07-20
  Administered 2016-07-20 (×2): 25 mL
  Filled 2016-07-20: qty 50

## 2016-07-20 MED ORDER — PENTAFLUOROPROP-TETRAFLUOROETH EX AERO: 1.0000 "application " | INHALATION_SPRAY | CUTANEOUS | Status: DC | PRN

## 2016-07-20 MED ORDER — SODIUM CHLORIDE 0.9 % IV SOLN: 100.0000 mL | INTRAVENOUS | Status: DC | PRN

## 2016-07-20 MED ORDER — LIDOCAINE-PRILOCAINE 2.5-2.5 % EX CREA: 1.0000 "application " | TOPICAL_CREAM | CUTANEOUS | Status: DC | PRN

## 2016-07-20 MED ORDER — HEPARIN SODIUM (PORCINE) 1000 UNIT/ML IJ SOLN
INTRAMUSCULAR | Status: DC | PRN
  Administered 2016-07-20: 3000 [IU] via INTRAVENOUS

## 2016-07-20 MED ORDER — ALTEPLASE 2 MG IJ SOLR: 2.0000 mg | Freq: Once | INTRAMUSCULAR | Status: DC | PRN

## 2016-07-20 MED ORDER — HEPARIN (PORCINE) IN NACL 2-0.9 UNIT/ML-% IJ SOLN
INTRAMUSCULAR | Status: AC
  Filled 2016-07-20: qty 500

## 2016-07-20 MED ORDER — HEPARIN (PORCINE) IN NACL 2-0.9 UNIT/ML-% IJ SOLN
INTRAMUSCULAR | Status: DC | PRN
  Administered 2016-07-20: 1000 mL

## 2016-07-20 MED ORDER — LIDOCAINE HCL (PF) 1 % IJ SOLN: 5.0000 mL | INTRAMUSCULAR | Status: DC | PRN

## 2016-07-20 MED ORDER — HEPARIN SODIUM (PORCINE) 1000 UNIT/ML DIALYSIS: 1000.0000 [IU] | INTRAMUSCULAR | Status: DC | PRN

## 2016-07-20 SURGICAL SUPPLY — 16 items
BAG SNAP BAND KOVER 36X36 (MISCELLANEOUS) ×2 IMPLANT
BALLN MUSTANG 6X100X135 (BALLOONS) ×2
BALLOON MUSTANG 6X100X135 (BALLOONS) ×1 IMPLANT
CATH PALINDROME RT-P 15FX55CM (CATHETERS) ×2 IMPLANT
COVER DOME SNAP 22 D (MISCELLANEOUS) ×2 IMPLANT
COVER PRB 48X5XTLSCP FOLD TPE (BAG) ×1 IMPLANT
COVER PROBE 5X48 (BAG) ×1
KIT MICROINTRODUCER STIFF 5F (SHEATH) ×2 IMPLANT
PROTECTION STATION PRESSURIZED (MISCELLANEOUS) ×2
SHEATH PINNACLE R/O II 6F 4CM (SHEATH) ×2 IMPLANT
STATION PROTECTION PRESSURIZED (MISCELLANEOUS) ×1 IMPLANT
STOPCOCK MORSE 400PSI 3WAY (MISCELLANEOUS) ×4 IMPLANT
TRAY PV CATH (CUSTOM PROCEDURE TRAY) ×2 IMPLANT
TUBING CIL FLEX 10 FLL-RA (TUBING) ×4 IMPLANT
WIRE BENTSON .035X145CM (WIRE) ×2 IMPLANT
WIRE HI TORQ VERSACORE J 260CM (WIRE) ×2 IMPLANT

## 2016-07-20 NOTE — Op Note (Addendum)
OPERATIVE NOTE  PROCEDURE: 1. Right femoral vein tunneled dialysis catheter placement 2. Right femoral vein cannulation under ultrasound guidance 3. Left brachiocephalic arteriovenous fistula cannulation 4. Left arm fistulogram 5. Angioplasty of left cephalic vein (6 mm x 093 mm)  PRE-OPERATIVE DIAGNOSIS: end-stage renal failure  POST-OPERATIVE DIAGNOSIS: same as above  SURGEON: Adele Barthel, MD  ANESTHESIA: local  ESTIMATED BLOOD LOSS: 30 cc  CONTRAST: 30 cc  FINDING(S): 1.  Tips of the catheter in the superior vena cava/right atrium on fluoroscopy 2.  50% stenosis in mid-segment of cephalic: near total resolution after venoplasty 3.  Improved diameter (6 mm) after venoplasty 4.  Patent fistula throughout 5.  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  Patent left subclavian and innominate vein 7.  Patent superior vena cava   SPECIMEN(S):  none  INDICATIONS:   Brandy Dudley is a 65 y.o. female who presents with end stage renal disease.  The patient has a recent superficialization and side branch ligation of left brachiocephalic arteriovenous fistula.  Her left femoral tunneled dialysis catheter recently needed removal for infection.  The patient presents for tunneled dialysis catheter placement and left brachiocephalic arteriovenous fistula fistulogram.  I discussed with the patient's sister the nature of angiographic procedures, especially the limited patencies of any endovascular intervention.  The patient's sister is aware of that the risks of an angiographic procedure include but are not limited to: bleeding, infection, access site complications, renal failure, embolization, rupture of vessel, dissection, arteriovenous fistula, possible need for emergent surgical intervention, possible need for surgical procedures to treat the patient's pathology, anaphylactic reaction to contrast, and stroke and death.  The  patient's sister is aware the risks of tunneled dialysis catheter placement include but are not limited to: bleeding, infection, central venous injury, pneumothorax, possible venous stenosis, possible malpositioning in the venous system, and possible infections related to long-term catheter presence.  The patient's sister was aware of these risks and agreed to proceed.   DESCRIPTION: After written full informed consent was obtained from the patient, the patient was taken back to the angiographic suite.  She was attached to monitoring equipment throughout the case.  The patient was prepped and draped in the standard fashion for a femoral vein tunneled dialysis catheter placement and left arm fistulogram.  The right groin cannulation site, the catheter exit site, and tract for the subcutaneous tunnel were then anesthesized with a total of 30 cc of a 1% lidocaine with epinephrine.  Under ultrasound guidance, the right femoral vein was cannulated with the 18 gauge needle.  A J-wire was then placed into the right atrium under fluroscopic guidance.  The wire was then secured in place with a clamp to the drapes.  I then made stab incisions at the cannulation and exit sites.  I dissected from the exit site to the cannulation site with a metal dissected and dilated the subcutaneous tunnel with a plastic dilator.  The wire was then unclamped and I removed the needle.  The skin tract and venotomy was dilated serially with dilators.  Finally, the dilator-sheath was placed under fluroscopic guidance into the right iliac vein.  The dilator and wire were removed.  A 55 cm Palindrome catheter was placed under fluoroscopic guidance into the right atrium.  The sheath was broken and peeled away while holding the catheter cuff at the level of the skin.  The back of the Palindrome was connected to the metal dilator and delivered  through the subcutaneous tunnel.  The back end of this catheter was transected, revealing the two lumens  of this catheter.  The ports were docked onto these two lumens.  The catheter hub was then clicked into place.  Each port was tested by aspirating and flushing.  No resistance was noted.  Each port was then thoroughly flushed with heparinized saline.  The catheter was secured in placed with two interrupted stitches of 3-0 Nylon tied to the catheter.  The cannulation incision was closed with a U-stitch of 4-0 Monocryl.  The cannulation and exit incisions were cleaned and sterile bandages applied.  Each port was then loaded with concentrated heparin (1000 Units/mL) at the manufacturer recommended volumes to each port.  Sterile caps were applied to each port.  On completion fluoroscopy, the tips of the catheter was in the superior vena cava/right atrium boundary, and there was no evidence of pneumothorax.  At this point, I turned my attention to left arm.  The left upper arm was prepped and draped in the standard fashion for a percutaneous access intervention.  Under ultrasound guidance, the left brachiocephalic arteriovenous fistula was cannulated with a micropuncture needle.  The microwire was advanced into the fistula and the needle was exchanged for the a microsheath, which was lodged 2 cm into the access.  The wire was removed and the sheath was connected to the IV extension tubing.  Hand injections were completed to image the access from the antecubitum up to the level of superior vena cava .  Based on the images, this patient will need: venoplasty.  A Bentson wire was advanced into the axillary vein and the sheath was exchanged for a short 6-Fr sheath.    Based on the imaging, a 6 mm x 100 mm angioplasty balloon was selected.  I decided to do a balloon assisted maturation procedure to improve the maturation rate of this fistula.  The balloon was inflated at 18 atm for 2 minutes along the entire cephalic vein proximal to the sheath.  On completion imaging, there was near complete resolution of the stenosis in  the mid-segment, suggesting a frozen valve.  The areas of relative narrowing also appeared to be resolved.    Based on the completion imaging, no further intervention is necessary.  The wire and balloon were removed from the sheath.  A 4-0 Monocryl purse-string suture was sewn around the sheath.  The sheath was removed while tying down the suture.  A sterile bandage was applied to the puncture site.   COMPLICATIONS: none  CONDITION: stable   Adele Barthel, MD [facility] Office: [phone] Pager: [phone]  07/20/2016, 1:59 PM

## 2016-07-20 NOTE — H&P (View-Only) (Signed)
Hospital Consult    Reason for Consult:  +blood cultures with tunneled dialysis catheter Referring Physician:  Arlean Hopping, MD MRN #:  [id number]  History of Present Illness: This is a 65 y.o. female on HD T-Th-Sa via left femoral tdc since last May. She also has a left arm brachiocephalic avf since May that was revised in July. Now admitted with abdominal pain and soa and is physically deconditioned with blood cultures + for pseudomonas. She will dialyze today via catheter and we are consulted for removal and evaluation of left arm avf.   Past Medical History:  Diagnosis Date  . AICD (automatic cardioverter/defibrillator) present   . Anemia   . C. difficile colitis   . CHF (congestive heart failure) (HCC)    systolic  . COPD (chronic obstructive pulmonary disease) (HCC)   . Coronary artery disease   . Diabetes mellitus (HCC)   . DVT (deep venous thrombosis) (HCC)    on coumadin  . ESRD (end stage renal disease) (HCC)    tues/thurs/sat dialysis  . Hypertension   . Obstructive sleep apnea    no cpap  . Renal disorder     Past Surgical History:  Procedure Laterality Date  . AV FISTULA PLACEMENT Left 04/03/2016   Procedure: ARTERIOVENOUS (AV) FISTULA CREATION;  Surgeon: Larina Earthly, MD;  Location: [facility] OR;  Service: Vascular;  Laterality: Left;  . FISTULA SUPERFICIALIZATION Left 06/16/2016   Procedure: SUPERFICIALIZATION OF LEFT ARM BRACHIOCEPHALIC ARTERIOVENOUS FISTULA;  Surgeon: Chuck Hint, MD;  Location: [facility] OR;  Service: Vascular;  Laterality: Left;  . INSERTION OF DIALYSIS CATHETER Left 04/03/2016   Procedure: INSERTION OF DIALYSIS CATHETER;  Surgeon: Larina Earthly, MD;  Location: [facility] OR;  Service: Vascular;  Laterality: Left;  . LIGATION OF COMPETING BRANCHES OF ARTERIOVENOUS FISTULA Left 06/16/2016   Procedure: LIGATION OF COMPETING BRANCHES OF left brachiocephalic ARTERIOVENOUS FISTULA;  Surgeon: Chuck Hint, MD;  Location: [facility] OR;  Service: Vascular;  Laterality:  Left;  . REVISION OF ARTERIOVENOUS GORETEX GRAFT Left 06/16/2016   Procedure: REVISION OF LEFT BRACHIOCEPHALIC ARTERIOVENOUS FISTULA WITH RESECTION OF REDUNDANT SECTION;  Surgeon: Chuck Hint, MD;  Location: [facility] OR;  Service: Vascular;  Laterality: Left;    Allergies  Allergen Reactions  . Entresto [Sacubitril-Valsartan] Other (See Comments)     unknown reaction  . Sulfa Antibiotics Itching    Prior to Admission medications   Medication Sig Start Date End Date Taking? Authorizing Provider  acetaminophen (TYLENOL) 325 MG tablet Take 325 mg by mouth every 6 (six) hours as needed for mild pain or fever (For fever >99.5).    Yes Historical Provider, MD  albuterol (PROAIR HFA) 108 (90 Base) MCG/ACT inhaler Inhale 2 puffs into the lungs every 6 (six) hours as needed for wheezing or shortness of breath.    Yes Historical Provider, MD  Amino Acids-Protein Hydrolys (FEEDING SUPPLEMENT, PRO-STAT SUGAR FREE 64,) LIQD Take 30 mLs by mouth daily.   Yes Historical Provider, MD  atorvastatin (LIPITOR) 10 MG tablet Take 10 mg by mouth at bedtime.    Yes Historical Provider, MD  Budesonide (PULMICORT FLEXHALER) 90 MCG/ACT inhaler Inhale 2 puffs into the lungs 2 (two) times daily.   Yes Historical Provider, MD  carvedilol (COREG) 3.125 MG tablet Take 3.125 mg by mouth 2 (two) times daily with a meal. Hold for SBP <119 or HR <60   Yes Historical Provider, MD  famotidine (PEPCID) 20 MG tablet Take 20 mg by mouth 2 (two) times daily.  Yes Historical Provider, MD  hydrOXYzine (ATARAX/VISTARIL) 50 MG tablet Take 50 mg by mouth every 6 (six) hours as needed for itching.   Yes Historical Provider, MD  insulin glargine (LANTUS) 100 unit/mL SOPN Inject 0.1 mLs (10 Units total) into the skin at bedtime. 05/09/16  Yes Catarina Hartshornavid Tat, MD  insulin lispro (HUMALOG) 100 UNIT/ML injection Inject 0-10 Units into the skin 3 (three) times daily with meals. CBG 0-59 Hypoglycemic Protocol, 60-149 0 units 150-250 5 units,  251-300 8 units, 301-350 10 units, >350 call MD   Yes Historical Provider, MD  Inulin (FIBER CHOICE PO) Take 2 tablets by mouth daily as needed (constipation).   Yes Historical Provider, MD  LORazepam (ATIVAN) 0.5 MG tablet Take 0.5 mg by mouth 2 (two) times daily as needed for anxiety.   Yes Historical Provider, MD  mirtazapine (REMERON) 15 MG tablet Take 1 tablet (15 mg total) by mouth at bedtime. 07/05/16  Yes Dinah C Ngetich, NP  Multiple Vitamins-Minerals (CERTAVITE/ANTIOXIDANTS) TABS Take 1 tablet by mouth at bedtime.   Yes Historical Provider, MD  Nutritional Supplements (NUTRITIONAL SUPPLEMENT PO) Take 90 mLs by mouth 2 (two) times daily. [employer]   Yes Historical Provider, MD  oxyCODONE-acetaminophen (ROXICET) 5-325 MG tablet Take 1-2 tablets by mouth every 4 (four) hours as needed. Patient taking differently: Take 1-2 tablets by mouth every 4 (four) hours as needed (mild pain (1 tablet )/ severe pain (2 tablets)).  06/19/16  Yes Sharon SellerJessica K Eubanks, NP  OXYGEN Inhale 2 L into the lungs as needed.    Yes Historical Provider, MD  saccharomyces boulardii (FLORASTOR) 250 MG capsule Take 250 mg by mouth 2 (two) times daily. For recurrent C. Diff.  Take 1 tablet (250 mg) by mouth twice daily until 2 weeks after completion of antibiotics (stop date 08/05/16   Yes Historical Provider, MD  sertraline (ZOLOFT) 25 MG tablet Take 25 mg by mouth at bedtime.    Yes Historical Provider, MD  vancomycin (VANCOCIN) 50 mg/mL oral solution Take 125 mg by mouth See admin instructions. Tapered course started 06/25/16: take 125 mg daily for 7 days, then take 125 mg every other day for 7 days, then take 125 mg every 3 days at 8am for 14 days, then stop   Yes Historical Provider, MD    Social History   Social History  . Marital status: Single    Spouse name: N/A  . Number of children: N/A  . Years of education: N/A   Occupational History  . Not on file.   Social History Main Topics  . Smoking status: Former Smoker     Types: Cigarettes    Quit date: 03/03/2006  . Smokeless tobacco: Never Used  . Alcohol use No  . Drug use: No  . Sexual activity: No   Other Topics Concern  . Not on file   Social History Narrative  . No narrative on file    No family history on file.  ROS: [x]  Positive   [ ]  Negative   [ ]  All sytems reviewed and are negative Cardiovascular: []  chest pain/pressure []  palpitations [x]  SOB lying flat []  DOE []  pain in legs while walking []  pain in legs at rest []  pain in legs at night []  non-healing ulcers [x]  hx of DVT []  swelling in legs  Pulmonary: []  productive cough []  asthma/wheezing []  home O2  Neurologic: []  weakness in []  arms []  legs []  numbness in []  arms []  legs []  hx of CVA []  mini  stroke   Endocrine:  [x]  diabetes []  thyroid disease  GI []  vomiting blood []  blood in stool  GU: [x]  CKD/renal failure []  HD--[]  M/W/F or []  T/T/S  Psychiatric: []  anxiety []  depression   Constitutional: []  fever []  chills   Physical Examination  Vitals:   07/17/16 0900 07/17/16 0922  BP: 115/69   Pulse: 85 85  Resp: 20 20  Temp: 98.3 F (36.8 C)    Body mass index is 32.51 kg/m.  General:  WDWN in NAD, evidence of temporal wasting Gait: Not observed HENT: WNL, normocephalic Pulmonary: normal non-labored breathing Cardiac: rrr Abdomen:  soft, NT/ND, no masses Extremities: left arm palpable radial pulse, thrill noted at antecubitum, doppler of avf to axilla with good flow   CBC    Component Value Date/Time   WBC 8.9 07/16/2016 0546   RBC 4.06 07/16/2016 0546   HGB 10.0 (L) 07/16/2016 0546   HCT 31.5 (L) 07/16/2016 0546   PLT 220 07/16/2016 0546   MCV 77.6 (L) 07/16/2016 0546   MCH 24.6 (L) 07/16/2016 0546   MCHC 31.7 07/16/2016 0546   RDW 18.5 (H) 07/16/2016 0546   LYMPHSABS 1.1 07/15/2016 1331   MONOABS 0.9 07/15/2016 1331   EOSABS 0.3 07/15/2016 1331   BASOSABS 0.0 07/15/2016 1331    BMET    Component Value Date/Time   NA  135 07/16/2016 0545   NA 143 06/01/2016   K 4.0 07/16/2016 0545   CL 97 (L) 07/16/2016 0545   CO2 26 07/16/2016 0545   GLUCOSE 230 (H) 07/16/2016 0545   BUN 26 (H) 07/16/2016 0545   BUN 52 (A) 06/01/2016   CREATININE 4.62 (H) 07/16/2016 0545   CALCIUM 8.6 (L) 07/16/2016 0545   GFRNONAA 9 (L) 07/16/2016 0545   GFRAA 11 (L) 07/16/2016 0545    COAGS: Lab Results  Component Value Date   INR 1.85 07/15/2016   INR 2.62 (H) 05/08/2016   INR 2.65 (H) 05/07/2016      ASSESSMENT/PLAN: This is a 65 y.o. female with esrd on dialysis via L groin tdc since last May. Also has left arm avf that has been revised 1 months ago.  -tdc to be pulled after hd today -duplex lue avf  -further recs to follow regarding temporary access  Ethanjames Fontenot C. Randie Heinz, MD [facility] Office: [phone] Pager: [phone]

## 2016-07-20 NOTE — Progress Notes (Signed)
PROGRESS NOTE    Brandy Dudley  ZOX:[id number] DOB: 07-04-1951 DOA: 07/15/2016 PCP: [name], MD   Chief Complaint  Patient presents with  . Shortness of Breath    Brief Narrative:  HPI On 07/15/2016 by Ms. Brandy Silk, NP Nakema Fake is a 65 y.o. female with medical history significant for chronic kidney disease on dialysis, history of recurrent C. Difficile colitis (initial treatment April 2017 with second treatment June 2017), umbilical hernia, OSA, diabetes, AICD in place, physical deconditioning, anemia of chronic kidney disease, Charcot foot who presented to the ER after developing shortness of breath during dialysis treatment and inability to tolerate outpatient dialysis treatment due to same. Patient reports that this past Thursday she missed a dialysis treatment to have an outpatient abdominal ultrasound obtained. It is noted she was unable to complete treatment today. Patient has already been evaluated by the nephrology team on the ER and despite having a negative chest x-ray clinical exam is consistent with volume overload so plans are to pursue dialysis as soon as possible today.  In addition to the above complaints patient has also been having issues with postprandial right upper quadrant abdominal pain associated with nausea and vomiting, very limited oral intake and subsequent 14 pound weight loss. This is documented in the nursing home. Patient confirms. Patient reports he still makes urine. She denies recently being told that she has had fevers and she denies chills. She is not having any diarrhea. She reports nonambulatory secondary to difficulty ambulating on right Charcot foot.  Assessment & Plan   Bacteremia -Pseudomonas bacteremia 1/2 blood cultures 07/15/16 -patient initially presented with RUQ abdominal pain and vague generalized symptoms. Blood cultures from 07/18/2016 show no growth to date -Infectious disease consulted and appreciated, discontinued cefepime,  continue PO vancomycin.  Felt pseudomonas is a contaminent -Patient getting dialysis through left femoral line, this is removed.  Volume overload/ESRD on dialysis  -Appears to be improving -Patient presents with shortness of breath in setting of missed dialysis with clinical exam consistent with volume overload -Had urgent dialysis admission with removal of 3 L of fluids -Oxygen prn and nightly at at bedtime -Vascular surgery planning for placement of new cath today  History of Clostridium difficile colitis/RUQ pain -Patient has been having issues with recurrent nausea and vomiting and right upper quadrant abdominal pain with weight loss -Had abdominal ultrasound as an outpatient-results unknown -No diarrhea, CT scan of abdomen/pelvis was done showed no colitis. -Gastroenterology consulted and appreciated, pending further recommendations  Leukocytosis -Resolved, See above regarding possible colitis -Not sure what it was but probably stress de-margination, this is resolved overnight.  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  -Continue Lantus, sliding scale insulin, CBG monitoring   Weight loss -As above -Nutrition consultation  Physical deconditioning -PT and OT evaluation- recommended SNF -May benefit from some sort of boot for offloading device for right Charcot foot to aid in improving mobilization  Essential hypertension -Blood pressure currently controlled  AICD (automatic cardioverter/defibrillator) present/history of systolic heart failure -Management with regular dialysis treatments -Continue other medical therapy with carvedilol  Anemia of chronic renal failure, stage 5  -Baseline hemoglobin between 9.6 and 10.2, currently 10.2  Umbilical hernia without obstruction or gangrene -Hernia unremarkable on today's exam  DVT Prophylaxis  Heparin  Code Status:  Full  Family Communication: None at bedside  Disposition Plan: Admitted. Pending cath placement today.  Consultants Nephrology Infectious disease Gastroenterology  Procedures  None  Antibiotics   Anti-infectives    Start     Dose/Rate Route Frequency Ordered Stop   07/18/16 1800  ceFEPIme (MAXIPIME) 2 g in dextrose 5 % 50 mL IVPB  Status:  Discontinued     2 g 100 mL/hr over 30 Minutes Intravenous Every T-Th-Sa (1800) 07/17/16 0339 07/18/16 0932   07/18/16 1000  ceFEPIme (MAXIPIME) 1 g in dextrose 5 % 50 mL IVPB  Status:  Discontinued     1 g 100 mL/hr over 30 Minutes Intravenous Every 24 hours 07/18/16 0932 07/19/16 1703   07/17/16 1800  [MAR Hold]  vancomycin (VANCOCIN) 50 mg/mL oral solution 125 mg     (MAR Hold since 07/20/16 1218)   125 mg Oral Every 6 hours 07/17/16 1615     07/17/16 0345  ceFEPIme (MAXIPIME) 2 g in dextrose 5 % 50 mL IVPB     2 g 100 mL/hr over 30 Minutes Intravenous  Once 07/17/16 0339 07/17/16 0544      Subjective:   Damaris SchoonerHelen Escoe seen and examined today.  Continues to complain of abdominal pain and nausea but feels it has improved mildly. She denies chest pain, feels her shortness of breath is improved. Patient states she is ready for her surgery today.  Objective:   Vitals:   07/19/16 2028 07/20/16 0420 07/20/16 0815 07/20/16 1018  BP: 114/71 105/63 120/66   Pulse: 73 68 71   Resp: 18 17 16    Temp: 98 F (36.7 C) 98.2 F (36.8 C) 97.8 F (36.6 C)   TempSrc: Oral Oral Oral   SpO2: 98% 99% 99% 94%  Weight: 83.9 kg (184 lb 14.4 oz)     Height: 5\' 4"  (1.626 m)       Intake/Output Summary (Last 24 hours) at 07/20/16 1251 Last data filed at 07/20/16 1049  Gross per 24 hour  Intake              240 ml  Output                0 ml  Net              240 ml   Filed Weights   07/17/16 2131 07/18/16 2025 07/19/16 2028  Weight: 83.5 kg (184 lb 1.4 oz) 83 kg (182 lb 15.7 oz) 83.9 kg (184 lb 14.4 oz)    Exam  General: Well developed, well  nourished, NAD, appears stated age  HEENT: NCAT,  mucous membranes moist.   Cardiovascular: S1 S2 auscultated, RRR  Respiratory: Diminished breath sounds, mild crackles noted in the lung bases.  Abdomen: Soft,RUQ TTP, nondistended, + bowel sounds, umbilical hernia  Extremities: warm dry without cyanosis clubbing   Neuro: AAOx3, nonfocal  Psych: Normal affect and demeanor with intact judgement and insight, pleasant   Data Reviewed: I have personally reviewed following labs and imaging studies  CBC:  Recent Labs Lab 07/15/16 1331 07/16/16 0546 07/17/16 1400 07/19/16 0452 07/20/16 0659  WBC 13.3* 8.9 9.8 8.7 9.5  NEUTROABS 11.0*  --   --   --   --   HGB 10.2* 10.0* 10.1* 9.9* 10.2*  HCT 31.4* 31.5* 31.7* 31.5* 32.5*  MCV 78.1 77.6* 77.9* 77.4* 78.9  PLT 333 220 223 168 192   Basic Metabolic Panel:  Recent Labs Lab 07/15/16 1800 07/16/16 0545 07/17/16 1400 07/19/16 0452 07/20/16 0659  NA 135 135 136 135 136  K 4.3 4.0 4.1 4.4 4.0  CL 97* 97* 100* 102 101  CO2 25 26 29 25 25   GLUCOSE 152* 230* 124* 76 54*  BUN 40* 26* 40* 41* 48*  CREATININE 6.36* 4.62* 5.64* 5.70* 6.06*  CALCIUM 8.5* 8.6* 8.6* 8.4* 8.6*  MG 2.2  --   --   --   --   PHOS 4.7* 4.3 4.3  --   --    GFR: Estimated Creatinine Clearance: 9.7 mL/min (by C-G formula based on SCr of 6.06 mg/dL). Liver Function Tests:  Recent Labs Lab 07/15/16 1800 07/16/16 0545 07/17/16 1400  ALBUMIN 2.0* 1.9* 1.9*   No results for input(s): LIPASE, AMYLASE in the last 168 hours. No results for input(s): AMMONIA in the last 168 hours. Coagulation Profile:  Recent Labs Lab 07/15/16 1331  INR 1.85   Cardiac Enzymes: No results for input(s): CKTOTAL, CKMB, CKMBINDEX, TROPONINI in the last 168 hours. BNP (last 3 results) No results for input(s): PROBNP in the last 8760 hours. HbA1C: No results for input(s): HGBA1C in the last 72 hours. CBG:  Recent Labs Lab 07/19/16 2131 07/20/16 0740 07/20/16 0824  07/20/16 1138 07/20/16 1208  GLUCAP 84 49* 75 51* 121*   Lipid Profile: No results for input(s): CHOL, HDL, LDLCALC, TRIG, CHOLHDL, LDLDIRECT in the last 72 hours. Thyroid Function Tests: No results for input(s): TSH, T4TOTAL, FREET4, T3FREE, THYROIDAB in the last 72 hours. Anemia Panel: No results for input(s): VITAMINB12, FOLATE, FERRITIN, TIBC, IRON, RETICCTPCT in the last 72 hours. Urine analysis:    Component Value Date/Time   COLORURINE AMBER (A) 07/15/2016 1636   APPEARANCEUR TURBID (A) 07/15/2016 1636   LABSPEC 1.025 07/15/2016 1636   PHURINE 5.0 07/15/2016 1636   GLUCOSEU NEGATIVE 07/15/2016 1636   HGBUR MODERATE (A) 07/15/2016 1636   BILIRUBINUR MODERATE (A) 07/15/2016 1636   KETONESUR 15 (A) 07/15/2016 1636   PROTEINUR 100 (A) 07/15/2016 1636   NITRITE NEGATIVE 07/15/2016 1636   LEUKOCYTESUR MODERATE (A) 07/15/2016 1636   Sepsis Labs: @LABRCNTIP (procalcitonin:4,lacticidven:4)  ) Recent Results (from the past 240 hour(s))  Urine culture     Status: None   Collection Time: 07/15/16  4:36 PM  Result Value Ref Range Status   Specimen Description URINE, CATHETERIZED  Final   Special Requests NONE  Final   Culture NO GROWTH  Final   Report Status 07/16/2016 FINAL  Final  Culture, blood (Routine X 2) w Reflex to ID Panel     Status: Abnormal   Collection Time: 07/15/16  6:20 PM  Result Value Ref Range Status   Specimen Description BLOOD VENOUS CATHETER  Final   Special Requests BOTTLES DRAWN AEROBIC AND ANAEROBIC 5CC  Final   Culture  Setup Time   Final    AEROBIC BOTTLE ONLY GRAM NEGATIVE RODS CRITICAL RESULT CALLED TO, READ BACK BY AND VERIFIED WITH: TO JLEDGFORD(PHARD) BY TCLEVELAND 07/17/2016 AT 3:29    Culture PSEUDOMONAS AERUGINOSA (A)  Final   Report Status 07/19/2016 FINAL  Final   Organism ID, Bacteria PSEUDOMONAS AERUGINOSA  Final      Susceptibility   Pseudomonas aeruginosa - MIC*    CEFTAZIDIME 4 SENSITIVE Sensitive     CIPROFLOXACIN <=0.25  SENSITIVE Sensitive     GENTAMICIN <=1 SENSITIVE Sensitive     IMIPENEM 2 SENSITIVE Sensitive     PIP/TAZO 8 SENSITIVE Sensitive     CEFEPIME 2 SENSITIVE Sensitive     * PSEUDOMONAS AERUGINOSA  Culture, blood (Routine X 2) w Reflex to ID Panel     Status: None (Preliminary result)   Collection Time: 07/15/16  6:20 PM  Result Value Ref Range Status   Specimen Description BLOOD ARTERIAL CATHETER  Final   Special Requests BOTTLES DRAWN AEROBIC AND ANAEROBIC 5CC  Final   Culture NO GROWTH 4 DAYS  Final   Report Status PENDING  Incomplete  Blood Culture ID Panel (Reflexed)     Status: Abnormal   Collection Time: 07/15/16  6:20 PM  Result Value Ref Range Status   Enterococcus species NOT DETECTED NOT DETECTED Final   Vancomycin resistance NOT DETECTED NOT DETECTED Final   Listeria monocytogenes NOT DETECTED NOT DETECTED Final   Staphylococcus species NOT DETECTED NOT DETECTED Final   Staphylococcus aureus NOT DETECTED NOT DETECTED Final   Methicillin resistance NOT DETECTED NOT DETECTED Final   Streptococcus species NOT DETECTED NOT DETECTED Final   Streptococcus agalactiae NOT DETECTED NOT DETECTED Final   Streptococcus pneumoniae NOT DETECTED NOT DETECTED Final   Streptococcus pyogenes NOT DETECTED NOT DETECTED Final   Acinetobacter baumannii NOT DETECTED NOT DETECTED Final   Enterobacteriaceae species NOT DETECTED NOT DETECTED Final   Enterobacter cloacae complex NOT DETECTED NOT DETECTED Final   Escherichia coli NOT DETECTED NOT DETECTED Final   Klebsiella oxytoca NOT DETECTED NOT DETECTED Final   Klebsiella pneumoniae NOT DETECTED NOT DETECTED Final   Proteus species NOT DETECTED NOT DETECTED Final   Serratia marcescens NOT DETECTED NOT DETECTED Final   Carbapenem resistance NOT DETECTED NOT DETECTED Final   Haemophilus influenzae NOT DETECTED NOT DETECTED Final   Neisseria meningitidis NOT DETECTED NOT DETECTED Final   Pseudomonas aeruginosa DETECTED (A) NOT DETECTED Final     Comment: CRITICAL RESULT CALLED TO, READ BACK BY AND VERIFIED WITH: TO JLEDGFORD(PHARD) BY TCLEVELAND 07/17/2016 AT 3:29    Candida albicans NOT DETECTED NOT DETECTED Final   Candida glabrata NOT DETECTED NOT DETECTED Final   Candida krusei NOT DETECTED NOT DETECTED Final   Candida parapsilosis NOT DETECTED NOT DETECTED Final   Candida tropicalis NOT DETECTED NOT DETECTED Final  MRSA PCR Screening     Status: None   Collection Time: 07/16/16 12:37 AM  Result Value Ref Range Status   MRSA by PCR NEGATIVE NEGATIVE Final    Comment:        The GeneXpert MRSA Assay (FDA approved for NASAL specimens only), is one component of a comprehensive MRSA colonization surveillance program. It is not intended to diagnose MRSA infection nor to guide or monitor treatment for MRSA infections.   Cath Tip Culture     Status: None (Preliminary result)   Collection Time: 07/18/16  9:00 AM  Result Value Ref Range Status   Specimen Description CATH TIP  Final   Special Requests NONE  Final   Culture NO GROWTH 2 DAYS  Final   Report Status PENDING  Incomplete  Culture, blood (Routine X 2) w Reflex to ID Panel     Status: None (Preliminary result)   Collection Time: 07/18/16  1:15 PM  Result Value Ref Range Status   Specimen Description BLOOD RIGHT HAND  Final   Special Requests BOTTLES DRAWN AEROBIC ONLY 5CC  Final   Culture NO GROWTH < 24 HOURS  Final   Report Status PENDING  Incomplete  Culture, blood (Routine X 2) w Reflex to ID Panel     Status: None (Preliminary result)   Collection Time: 07/18/16  1:21 PM  Result Value Ref Range Status   Specimen Description BLOOD RIGHT HAND  Final   Special Requests BOTTLES DRAWN AEROBIC AND ANAEROBIC 4CC EA  Final  Culture NO GROWTH < 24 HOURS  Final   Report Status PENDING  Incomplete      Radiology Studies: No results found.   Scheduled Meds: . [MAR Hold] atorvastatin  10 mg Oral QHS  . [MAR Hold] budesonide  0.25 mg Nebulization BID  . [MAR  Hold] carvedilol  3.125 mg Oral BID WC  . [MAR Hold] famotidine  20 mg Oral QHS  . [MAR Hold] heparin  5,000 Units Subcutaneous Q8H  . [MAR Hold] insulin aspart  0-5 Units Subcutaneous QHS  . [MAR Hold] insulin aspart  0-9 Units Subcutaneous TID WC  . [MAR Hold] insulin glargine  10 Units Subcutaneous QHS  . [MAR Hold] lactose free nutrition  237 mL Oral Q1500  . [MAR Hold] lidocaine (PF)  10 mL Intradermal Once  . [MAR Hold] mirtazapine  15 mg Oral QHS  . [MAR Hold] multivitamin with minerals  1 tablet Oral QHS  . [MAR Hold] saccharomyces boulardii  250 mg Oral BID  . [MAR Hold] sertraline  25 mg Oral Daily  . [MAR Hold] sodium chloride flush  3 mL Intravenous Q12H  . [MAR Hold] vancomycin  125 mg Oral Q6H   Continuous Infusions:    LOS: 3 days   Time Spent in minutes   30 minutes  Jamariah Tony D.O. on 07/20/2016 at 12:51 PM  Between 7am to 7pm - Pager - [phone]  After 7pm go to [email or web address] - password [email or web address]  And look for the night coverage person covering for me after hours  [facility] Office  [phone]

## 2016-07-20 NOTE — Progress Notes (Signed)
Inpatient Diabetes Program Recommendations  AACE/ADA: New Consensus Statement on Inpatient Glycemic Control (2015)  Target Ranges:  Prepandial:   less than 140 mg/dL      Peak postprandial:   less than 180 mg/dL (1-2 hours)      Critically ill patients:  140 - 180 mg/dL   Lab Results  Component Value Date   GLUCAP 75 07/20/2016   HGBA1C 5.6 07/15/2016    Review of Glycemic Control:  Results for Brandy Dudley, COLOM (MRN [id number]) as of 07/20/2016 11:02  Ref. Range 07/19/2016 07:40 07/19/2016 11:29 07/19/2016 16:25 07/19/2016 21:31 07/20/2016 07:40 07/20/2016 08:24  Glucose-Capillary Latest Ref Range: 65 - 99 mg/dL 74 80 77 84 49 (L) 75   Diabetes history: Type 2 diabetes Outpatient Diabetes medications: Lantus 10 units daily, Humalog 0-10 units tid with meals Current orders for Inpatient glycemic control:  Novolog sensitive tid with meals and HS, Lantus 10 units daily  Inpatient Diabetes Program Recommendations:    Blood sugars low.  Please consider holding Lantus insulin until blood sugar greater than 150 mg/dL.  Thanks, Beryl Meager, RN, BC-ADM Inpatient Diabetes Coordinator Pager [phone] (8a-5p)

## 2016-07-20 NOTE — Interval H&P Note (Signed)
[facility] [place]  History and Physical Update  The patient was interviewed and re-examined.  The patient's previous History and Physical has been reviewed and is unchanged from Dr. Darcella Cheshire consult.  There is no change in the plan of care: placement of femoral tunneled dialysis catheter, left arm fistulogram.   I discussed with the patient's sister the nature of angiographic procedures, especially the limited patencies of any endovascular intervention.    The patient's sister is aware of that the risks of an angiographic procedure include but are not limited to: bleeding, infection, access site complications, renal failure, embolization, rupture of vessel, dissection, arteriovenous fistula, possible need for emergent surgical intervention, possible need for surgical procedures to treat the patient's pathology, anaphylactic reaction to contrast, and stroke and death.    The patient'sister is aware the risks of tunneled dialysis catheter placement include but are not limited to: bleeding, infection, central venous injury, pneumothorax, possible venous stenosis, possible malpositioning in the venous system, and possible infections related to long-term catheter presence.   The patient's sister was aware of these risks and agreed to proceed.   Leonides Sake, MD [facility] [place] Office: [phone] Pager: [phone]  07/20/2016, 12:55 PM

## 2016-07-20 NOTE — Care Management Important Message (Signed)
Important Message  Patient Details  Name: Brandy Dudley MRN: [id number] Date of Birth: 07/10/1951   Medicare Important Message Given:  Yes    [name] 07/20/2016, 10:09 AM

## 2016-07-20 NOTE — Progress Notes (Signed)
[facility] Progress Note   Subjective: no c/o Vitals:   07/19/16 0908 07/19/16 1716 07/19/16 2028 07/20/16 0420  BP: 112/68 125/77 114/71 105/63  Pulse: 73 74 73 68  Resp: 18 17 18 17   Temp: 97.8 F (36.6 C) 97.9 F (36.6 C) 98 F (36.7 C) 98.2 F (36.8 C)  TempSrc: Oral Oral Oral Oral  SpO2: 97% 100% 98% 99%  Weight:   83.9 kg (184 lb 14.4 oz)   Height:   5\' 4"  (1.626 m)     Inpatient medications: . atorvastatin  10 mg Oral QHS  . budesonide  0.25 mg Nebulization BID  . carvedilol  3.125 mg Oral BID WC  . famotidine  20 mg Oral QHS  . heparin  5,000 Units Subcutaneous Q8H  . insulin aspart  0-5 Units Subcutaneous QHS  . insulin aspart  0-9 Units Subcutaneous TID WC  . insulin glargine  10 Units Subcutaneous QHS  . lactose free nutrition  237 mL Oral Q1500  . lidocaine (PF)  10 mL Intradermal Once  . mirtazapine  15 mg Oral QHS  . multivitamin with minerals  1 tablet Oral QHS  . saccharomyces boulardii  250 mg Oral BID  . sertraline  25 mg Oral Daily  . sodium chloride flush  3 mL Intravenous Q12H  . vancomycin  125 mg Oral Q6H     sodium chloride, acetaminophen **OR** acetaminophen, albuterol, hydrOXYzine, LORazepam, ondansetron **OR** ondansetron (ZOFRAN) IV, oxyCODONE-acetaminophen, sodium chloride flush  Exam: General: AAF NAD  Head: NCAT sclera not icteric MMM no upper teeth - left dentures at NH Neck: Supple  Lungs: dim bases bilateral crackles L>R Breathing is unlabored. Heart: RRR with S1 S2. No rub Abdomen: obese with umbilical hernia; central and RUQ tenderness; diffuse anasarca with dependent edema in flank/buttocks and upper thighs Lower extremities: + LE edema or ischemic changes, no open wounds  Neuro: A & O  X 3. Moves all extremities spontaneously. Psych:  Responds to questions appropriately with a normal affect; calmer today than on HD yesterday. Dialysis Access: left Fem cath out - dressing intact left upper AVF maturing post  ligation + bruit strongest in antecubital area  [facility]  4h 89kg  3K/ 2.25 bath  L fem cath (removed)/ LUA AVF revised 7/21 Hep 6000  Mircera 225 last given 7/29 due to missed tmts no Fe or calcitriol Recent labs: hgb 10.2 8/11 28% sat July iPTH 182 no binders P 4.4   Assessment: 1. Abdominal pain/nausea/ wt loss- CT abd showed enlarged liver, normal GB/ panc/ bowel, some ascites/ anasarca, marked cardiomegaly.  She is losing a lot of body weight.  GI is evaluating.  2. Volume excess - 5kg under dry wt and still has significant vol to get off 3. ESRD TTS- HD today after new cath placed, max UF , get vol down. Await AVF to be ready for use after recent revision /ligation of branches on July 21st 4. Hypotension - BP's better. 110 approx systolic 5. Anemia  - hgb 10.1 - stable - continue ESA - dosing slightly lower at Aranesp 150 given Monday x 1 off schedule 6. Metabolic bone disease -  No VDRA or binders for now P iPTH ok 7. Nutrition - wt loss, as above 8. Recurrent C diff - risk or relapse - po Vanc started by ID 9. 1/2 + BC pseudomonas aeruginosa - Day 2 cefepime- TDC out 8/22, to be replaced today; ID thinks this is a contaminant (1+ cx  from HD cath, peripheral cx neg, no fever/ no ^wbc) 10. Hx AICD 11. CM EF 35-40% - on low dose coreg, may have to hold if BP's drop w lower dry wt 12. Disp - [facility]   Plan - new cath and HD today, extra HD Friday for volume   Vinson Moselleob Maliah Pyles MD [facility] pager [phone]    cell [phone] 07/20/2016, 9:46 AM    Recent Labs Lab 07/15/16 1800 07/16/16 0545 07/17/16 1400 07/19/16 0452 07/20/16 0659  NA 135 135 136 135 136  K 4.3 4.0 4.1 4.4 4.0  CL 97* 97* 100* 102 101  CO2 25 26 29 25 25   GLUCOSE 152* 230* 124* 76 54*  BUN 40* 26* 40* 41* 48*  CREATININE 6.36* 4.62* 5.64* 5.70* 6.06*  CALCIUM 8.5* 8.6* 8.6* 8.4* 8.6*  PHOS 4.7* 4.3 4.3  --   --     Recent Labs Lab 07/15/16 1800  07/16/16 0545 07/17/16 1400  ALBUMIN 2.0* 1.9* 1.9*    Recent Labs Lab 07/15/16 1331  07/17/16 1400 07/19/16 0452 07/20/16 0659  WBC 13.3*  < > 9.8 8.7 9.5  NEUTROABS 11.0*  --   --   --   --   HGB 10.2*  < > 10.1* 9.9* 10.2*  HCT 31.4*  < > 31.7* 31.5* 32.5*  MCV 78.1  < > 77.9* 77.4* 78.9  PLT 333  < > 223 168 192  < > = values in this interval not displayed. Iron/TIBC/Ferritin/ %Sat    Component Value Date/Time   IRON 31 04/04/2016 0748   TIBC 328 04/04/2016 0748   FERRITIN 181 04/04/2016 0748   IRONPCTSAT 9 (L) 04/04/2016 [id number]

## 2016-07-20 NOTE — Progress Notes (Signed)
  Progress Note    07/20/2016 7:45 AM * No surgery date entered *  Subjective:  No issues overnight  Vitals:   07/19/16 2028 07/20/16 0420  BP: 114/71 105/63  Pulse: 73 68  Resp: 18 17  Temp: 98 F (36.7 C) 98.2 F (36.8 C)    Physical Exam: RRR Non labored left upper arm avf palpable at antecubitum Faint bruit on proximal arm   CBC    Component Value Date/Time   WBC 9.5 07/20/2016 0659   RBC 4.12 07/20/2016 0659   HGB 10.2 (L) 07/20/2016 0659   HCT 32.5 (L) 07/20/2016 0659   PLT 192 07/20/2016 0659   MCV 78.9 07/20/2016 0659   MCH 24.8 (L) 07/20/2016 0659   MCHC 31.4 07/20/2016 0659   RDW 19.2 (H) 07/20/2016 0659   LYMPHSABS 1.1 07/15/2016 1331   MONOABS 0.9 07/15/2016 1331   EOSABS 0.3 07/15/2016 1331   BASOSABS 0.0 07/15/2016 1331    BMET    Component Value Date/Time   NA 135 07/19/2016 0452   NA 143 06/01/2016   K 4.4 07/19/2016 0452   CL 102 07/19/2016 0452   CO2 25 07/19/2016 0452   GLUCOSE 76 07/19/2016 0452   BUN 41 (H) 07/19/2016 0452   BUN 52 (A) 06/01/2016   CREATININE 5.70 (H) 07/19/2016 0452   CALCIUM 8.4 (L) 07/19/2016 0452   GFRNONAA 7 (L) 07/19/2016 0452   GFRAA 8 (L) 07/19/2016 0452    INR    Component Value Date/Time   INR 1.85 07/15/2016 1331     Intake/Output Summary (Last 24 hours) at 07/20/16 0745 Last data filed at 07/19/16 1841  Gross per 24 hour  Intake              540 ml  Output                0 ml  Net              540 ml     Assessment:  65 y.o. female is here with infected tdc now removed  Plan: pv lab today for left arm shuntogram and tunneled catheter placement   Mashell Sieben C. Randie Heinz, MD [facility] [place] Office: [phone] Pager: [phone]  07/20/2016 7:45 AM

## 2016-07-20 NOTE — Progress Notes (Signed)
Patient ID: Brandy Dudley, female   DOB: [birth year], 65 y.o.   MRN: [id number]          [facility]    Date of Admission:  07/15/2016           Day 4 oral vancomycin  Principal Problem:   Positive blood culture Active Problems:   History of Clostridium difficile colitis   RUQ pain   Physical deconditioning   Coronary artery disease involving native coronary artery of native heart without angina pectoris   Essential hypertension, malignant   Diabetes mellitus type 2 in obese (HCC)   COPD (chronic obstructive pulmonary disease) (HCC)   Obstructive sleep apnea   AICD (automatic cardioverter/defibrillator) present   ESRD on dialysis (HCC)   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 atorvastatin  10 mg Oral QHS  . budesonide  0.25 mg Nebulization BID  . carvedilol  3.125 mg Oral BID WC  . famotidine  20 mg Oral QHS  . heparin  5,000 Units Subcutaneous Q8H  . insulin aspart  0-5 Units Subcutaneous QHS  . insulin aspart  0-9 Units Subcutaneous TID WC  . insulin glargine  10 Units Subcutaneous QHS  . lactose free nutrition  237 mL Oral Q1500  . lidocaine (PF)  10 mL Intradermal Once  . mirtazapine  15 mg Oral QHS  . multivitamin with minerals  1 tablet Oral QHS  . saccharomyces boulardii  250 mg Oral BID  . sertraline  25 mg Oral Daily  . sodium chloride flush  3 mL Intravenous Q12H  . vancomycin  125 mg Oral Q6H    SUBJECTIVE: She is feeling better with less abdominal pain. She now tells me that she believes her pain is due to her umbilical hernia. She has not had any diarrhea.  Review of  Systems: Review of Systems  Constitutional: Negative for chills, diaphoresis and fever.  Gastrointestinal: Positive for abdominal pain. Negative for constipation, diarrhea, heartburn, nausea and vomiting.    Past Medical History:  Diagnosis Date  . AICD (automatic cardioverter/defibrillator) present   . Anemia   . C. difficile colitis   . CHF (congestive heart failure) (HCC)    systolic  . COPD (chronic obstructive pulmonary disease) (HCC)   . Coronary artery disease   . Diabetes mellitus (HCC)   . DVT (deep venous thrombosis) (HCC)    on coumadin  . ESRD (end stage renal disease) (HCC)    tues/thurs/sat dialysis  . Hypertension   . Obstructive sleep apnea    no cpap  . Renal disorder     Social History  Substance Use Topics  . Smoking status: Former Smoker    Types: Cigarettes    Quit date: 03/03/2006  . Smokeless tobacco: Never Used  . Alcohol use No    No family history on file. Allergies  Allergen Reactions  . Entresto [Sacubitril-Valsartan] Other (See Comments)     unknown reaction  . Sulfa Antibiotics Itching    OBJECTIVE: Vitals:   07/20/16 1347 07/20/16 1352 07/20/16 1357 07/20/16 1402  BP: 116/75 116/71 113/76 110/74  Pulse: 65 67 68 66  Resp: 17 19 19 16   Temp:      TempSrc:      SpO2: 99% 100% 100% 100%  Weight:      Height:       Body mass index is 31.74 kg/m.  Physical Exam  Constitutional:  She is resting quietly in bed. She is smiling and in good spirits.  Abdominal: Soft. Bowel sounds are normal. She exhibits no distension. There is tenderness.  She has mild diffuse tenderness.    Lab Results Lab Results  Component Value Date   WBC 9.5 07/20/2016   HGB 10.2 (L) 07/20/2016   HCT 32.5 (L) 07/20/2016   MCV 78.9 07/20/2016   PLT 192 07/20/2016    Lab Results  Component Value Date   CREATININE 6.06 (H) 07/20/2016   BUN 48 (H) 07/20/2016   NA 136 07/20/2016   K 4.0 07/20/2016   CL 101 07/20/2016   CO2 25 07/20/2016    Lab  Results  Component Value Date   ALT 8 06/01/2016   AST 15 06/01/2016   ALKPHOS 234 (A) 06/01/2016   BILITOT 1.5 (H) 05/06/2016     Microbiology: Recent Results (from the past 240 hour(s))  Urine culture     Status: None   Collection Time: 07/15/16  4:36 PM  Result Value Ref Range Status   Specimen Description URINE, CATHETERIZED  Final   Special Requests NONE  Final   Culture NO GROWTH  Final   Report Status 07/16/2016 FINAL  Final  Culture, blood (Routine X 2) w Reflex to ID Panel     Status: Abnormal   Collection Time: 07/15/16  6:20 PM  Result Value Ref Range Status   Specimen Description BLOOD VENOUS CATHETER  Final   Special Requests BOTTLES DRAWN AEROBIC AND ANAEROBIC 5CC  Final   Culture  Setup Time   Final    AEROBIC BOTTLE ONLY GRAM NEGATIVE RODS CRITICAL RESULT CALLED TO, READ BACK BY AND VERIFIED WITH: TO JLEDGFORD(PHARD) BY TCLEVELAND 07/17/2016 AT 3:29    Culture PSEUDOMONAS AERUGINOSA (A)  Final   Report Status 07/19/2016 FINAL  Final   Organism ID, Bacteria PSEUDOMONAS AERUGINOSA  Final      Susceptibility   Pseudomonas aeruginosa - MIC*    CEFTAZIDIME 4 SENSITIVE Sensitive     CIPROFLOXACIN <=0.25 SENSITIVE Sensitive     GENTAMICIN <=1 SENSITIVE Sensitive     IMIPENEM 2 SENSITIVE Sensitive     PIP/TAZO 8 SENSITIVE Sensitive     CEFEPIME 2 SENSITIVE Sensitive     * PSEUDOMONAS AERUGINOSA  Culture, blood (Routine X 2) w Reflex to ID Panel     Status: None   Collection Time: 07/15/16  6:20 PM  Result Value Ref Range Status   Specimen Description BLOOD ARTERIAL CATHETER  Final   Special Requests BOTTLES DRAWN AEROBIC AND ANAEROBIC 5CC  Final   Culture NO GROWTH 5 DAYS  Final   Report Status 07/20/2016 FINAL  Final  Blood Culture ID Panel (Reflexed)     Status: Abnormal   Collection Time: 07/15/16  6:20 PM  Result Value Ref Range Status   Enterococcus species NOT DETECTED NOT DETECTED Final   Vancomycin resistance NOT DETECTED NOT DETECTED Final   Listeria  monocytogenes NOT DETECTED NOT DETECTED Final   Staphylococcus species NOT DETECTED NOT DETECTED Final   Staphylococcus aureus NOT DETECTED NOT DETECTED Final   Methicillin resistance NOT DETECTED NOT DETECTED Final   Streptococcus species NOT DETECTED NOT DETECTED Final   Streptococcus agalactiae  NOT DETECTED NOT DETECTED Final   Streptococcus pneumoniae NOT DETECTED NOT DETECTED Final   Streptococcus pyogenes NOT DETECTED NOT DETECTED Final   Acinetobacter baumannii NOT DETECTED NOT DETECTED Final   Enterobacteriaceae species NOT DETECTED NOT DETECTED Final   Enterobacter cloacae complex NOT DETECTED NOT DETECTED Final   Escherichia coli NOT DETECTED NOT DETECTED Final   Klebsiella oxytoca NOT DETECTED NOT DETECTED Final   Klebsiella pneumoniae NOT DETECTED NOT DETECTED Final   Proteus species NOT DETECTED NOT DETECTED Final   Serratia marcescens NOT DETECTED NOT DETECTED Final   Carbapenem resistance NOT DETECTED NOT DETECTED Final   Haemophilus influenzae NOT DETECTED NOT DETECTED Final   Neisseria meningitidis NOT DETECTED NOT DETECTED Final   Pseudomonas aeruginosa DETECTED (A) NOT DETECTED Final    Comment: CRITICAL RESULT CALLED TO, READ BACK BY AND VERIFIED WITH: TO JLEDGFORD(PHARD) BY TCLEVELAND 07/17/2016 AT 3:29    Candida albicans NOT DETECTED NOT DETECTED Final   Candida glabrata NOT DETECTED NOT DETECTED Final   Candida krusei NOT DETECTED NOT DETECTED Final   Candida parapsilosis NOT DETECTED NOT DETECTED Final   Candida tropicalis NOT DETECTED NOT DETECTED Final  MRSA PCR Screening     Status: None   Collection Time: 07/16/16 12:37 AM  Result Value Ref Range Status   MRSA by PCR NEGATIVE NEGATIVE Final    Comment:        The GeneXpert MRSA Assay (FDA approved for NASAL specimens only), is one component of a comprehensive MRSA colonization surveillance program. It is not intended to diagnose MRSA infection nor to guide or monitor treatment for MRSA  infections.   Cath Tip Culture     Status: None (Preliminary result)   Collection Time: 07/18/16  9:00 AM  Result Value Ref Range Status   Specimen Description CATH TIP  Final   Special Requests NONE  Final   Culture NO GROWTH 2 DAYS  Final   Report Status PENDING  Incomplete  Culture, blood (Routine X 2) w Reflex to ID Panel     Status: None (Preliminary result)   Collection Time: 07/18/16  1:15 PM  Result Value Ref Range Status   Specimen Description BLOOD RIGHT HAND  Final   Special Requests BOTTLES DRAWN AEROBIC ONLY 5CC  Final   Culture NO GROWTH < 24 HOURS  Final   Report Status PENDING  Incomplete  Culture, blood (Routine X 2) w Reflex to ID Panel     Status: None (Preliminary result)   Collection Time: 07/18/16  1:21 PM  Result Value Ref Range Status   Specimen Description BLOOD RIGHT HAND  Final   Special Requests BOTTLES DRAWN AEROBIC AND ANAEROBIC 4CC EA  Final   Culture NO GROWTH < 24 HOURS  Final   Report Status PENDING  Incomplete     ASSESSMENT: I'm not sure what is causing her abdominal pain. She does have an umbilical hernia but I doubt that is causing all of her diffuse discomfort. I do not think she has pain due to a recurrence of C. difficile colitis. I will continue oral vancomycin for 6 more days based on current guidelines to extend therapy for one week after finishing other systemic antibiotic treatment.  PLAN: 1. Continue oral vancomycin for 6 more days through 07/26/2016 2. I will sign off now but please call if I can be of further assistance while she is here  Cliffton AstersJohn Milta Croson, MD [facility] [phone] pager   [phone]  cell 07/20/2016, 2:53 PM

## 2016-07-21 ENCOUNTER — Other Ambulatory Visit: Payer: Medicare PPO

## 2016-07-21 ENCOUNTER — Encounter (HOSPITAL_COMMUNITY): Payer: Self-pay | Admitting: *Deleted

## 2016-07-21 ENCOUNTER — Other Ambulatory Visit: Payer: Self-pay

## 2016-07-21 ENCOUNTER — Inpatient Hospital Stay (HOSPITAL_COMMUNITY): Payer: Medicare PPO

## 2016-07-21 DIAGNOSIS — Z01810 Encounter for preprocedural cardiovascular examination: Secondary | ICD-10-CM

## 2016-07-21 DIAGNOSIS — I2510 Atherosclerotic heart disease of native coronary artery without angina pectoris: Secondary | ICD-10-CM

## 2016-07-21 LAB — BASIC METABOLIC PANEL
ANION GAP: 11 (ref 5–15)
BUN: 30 mg/dL — ABNORMAL HIGH (ref 6–20)
CALCIUM: 8.2 mg/dL — AB (ref 8.9–10.3)
CO2: 27 mmol/L (ref 22–32)
CREATININE: 4.49 mg/dL — AB (ref 0.44–1.00)
Chloride: 97 mmol/L — ABNORMAL LOW (ref 101–111)
GFR, EST AFRICAN AMERICAN: 11 mL/min — AB (ref >60)
GFR, EST NON AFRICAN AMERICAN: 9 mL/min — AB (ref >60)
Glucose, Bld: 82 mg/dL (ref 65–99)
Potassium: 3.6 mmol/L (ref 3.5–5.1)
SODIUM: 135 mmol/L (ref 135–145)

## 2016-07-21 LAB — CATH TIP CULTURE: CULTURE: NO GROWTH

## 2016-07-21 LAB — CBC
HCT: 30.8 % — ABNORMAL LOW (ref 36.0–46.0)
Hemoglobin: 9.7 g/dL — ABNORMAL LOW (ref 12.0–15.0)
MCH: 24.7 pg — ABNORMAL LOW (ref 26.0–34.0)
MCHC: 31.5 g/dL (ref 30.0–36.0)
MCV: 78.4 fL (ref 78.0–100.0)
PLATELETS: 125 10*3/uL — AB (ref 150–400)
RBC: 3.93 MIL/uL (ref 3.87–5.11)
RDW: 19.1 % — AB (ref 11.5–15.5)
WBC: 12.5 10*3/uL — AB (ref 4.0–10.5)

## 2016-07-21 LAB — GLUCOSE, CAPILLARY
GLUCOSE-CAPILLARY: 120 mg/dL — AB (ref 65–99)
GLUCOSE-CAPILLARY: 125 mg/dL — AB (ref 65–99)
Glucose-Capillary: 121 mg/dL — ABNORMAL HIGH (ref 65–99)

## 2016-07-21 MED ORDER — PRO-STAT SUGAR FREE PO LIQD
30.0000 mL | Freq: Two times a day (BID) | ORAL | Status: DC
  Filled 2016-07-21: qty 30

## 2016-07-21 NOTE — Clinical Social Work Note (Signed)
Patient from [facility] and has [facility] PPO and Medicaid. CSW submitted clinicals to [employer] for insurance auth. On 8/24 CSW attending MD received call from insurance representative for peer-to-peer and was informed that patient denied for ST rehab as she is LTC. CSW communicated with Albin Felling, admissions director at [facility] regarding situation and CSW advised that patient can return LTC as she has Medicaid. Patient will discharge this Saturday and weekend CSW will be advised.  Genelle Bal, MSW, LCSW Licensed Clinical Social Worker Clinical Social Work Department [employer] [phone]

## 2016-07-21 NOTE — Progress Notes (Addendum)
VASCULAR LAB PRELIMINARY  PRELIMINARY  PRELIMINARY  PRELIMINARY  Right  Upper Extremity Vein Map    Cephalic  Segment Diameter Depth Comment  1. Axilla 2.36mm 10.69mm   2. Mid upper arm 2.24mm 6.25mm tortuous  3. Above [facility] 1.21mm 6.31mm branch  4. In [facility] 2.20mm 2.75mm   5. Below AC 1.57mm 7.74mm   6. Mid forearm 1.16mm 4.46mm   7. Wrist mm mm    mm mm    mm mm    mm mm    Basilic  Segment Diameter Depth Comment  1. Axilla mm mm   2. Mid upper arm 2.64mm 17.14mm   3. Above [facility] 2.52mm 9.57mm branch  4. In AC 2.76mm 10.39mm   5. Below AC mm mm   6. Mid forearm mm mm   7. Wrist mm mm    mm mm    mm mm    mm mm     Farrel Demark, RDMS, RVT  07/21/2016, 4:21 PM

## 2016-07-21 NOTE — Progress Notes (Signed)
[facility] Progress Note   Subjective:  "Do you have any chocolate?" Denies SOB at present. No C/Os.   Objective Vitals:   07/21/16 0746 07/21/16 0800 07/21/16 0830 07/21/16 0900  BP: 116/73 105/66 103/62 (!) 102/59  Pulse: 76 77 78 77  Resp:      Temp:      TempSrc:      SpO2:      Weight:      Height:       Physical Exam General: Pleasant, NAD  Heart: S1,S2, RRR Lungs:Bilateral breath sounds sl dec in bases CTA Abdomen: Soft, nontender, active bs Extremities: No LE edema today Dialysis Access: New R femoral HD catheter-blood lines connected-no issues. Maturing LUA AVF + bruit  Dialysis Orders:[facility] 4h 89kg  3K/ 2.25 bath  L fem cath (removed)/ LUA AVF revised 7/21 Hep 6000  Mircera 225 last given 7/29 due to missed tmts no Fe or calcitriol Recent labs: hgb 10.2 8/11 28% sat July iPTH 182 no binders P 4.4  Additional Objective Labs: Basic Metabolic Panel:  Recent Labs Lab 07/15/16 1800 07/16/16 0545 07/17/16 1400 07/19/16 0452 07/20/16 0659 07/21/16 0444  NA 135 135 136 135 136 135  K 4.3 4.0 4.1 4.4 4.0 3.6  CL 97* 97* 100* 102 101 97*  CO2 25 26 29 25 25 27   GLUCOSE 152* 230* 124* 76 54* 82  BUN 40* 26* 40* 41* 48* 30*  CREATININE 6.36* 4.62* 5.64* 5.70* 6.06* 4.49*  CALCIUM 8.5* 8.6* 8.6* 8.4* 8.6* 8.2*  PHOS 4.7* 4.3 4.3  --   --   --    Liver Function Tests:  Recent Labs Lab 07/15/16 1800 07/16/16 0545 07/17/16 1400  ALBUMIN 2.0* 1.9* 1.9*  CBC:  Recent Labs Lab 07/15/16 1331 07/16/16 0546 07/17/16 1400 07/19/16 0452 07/20/16 0659 07/21/16 0444  WBC 13.3* 8.9 9.8 8.7 9.5 12.5*  NEUTROABS 11.0*  --   --   --   --   --   HGB 10.2* 10.0* 10.1* 9.9* 10.2* 9.7*  HCT 31.4* 31.5* 31.7* 31.5* 32.5* 30.8*  MCV 78.1 77.6* 77.9* 77.4* 78.9 78.4  PLT 333 220 223 168 192 125*   Blood Culture    Component Value Date/Time   SDES BLOOD RIGHT HAND 07/18/2016 1321   SPECREQUEST BOTTLES DRAWN AEROBIC AND ANAEROBIC 4CC EA  07/18/2016 1321   CULT NO GROWTH 2 DAYS 07/18/2016 1321   REPTSTATUS PENDING 07/18/2016 1321    Cardiac Enzymes: No results for input(s): CKTOTAL, CKMB, CKMBINDEX, TROPONINI in the last 168 hours. CBG:  Recent Labs Lab 07/20/16 0740 07/20/16 0824 07/20/16 1138 07/20/16 1208 07/20/16 2126  GLUCAP 49* 75 51* 121* 83   Iron Studies: No results for input(s): IRON, TIBC, TRANSFERRIN, FERRITIN in the last 72 hours. @lablastinr3 @ Studies/Results: Dg Chest Port 1 View  Result Date: 07/20/2016 CLINICAL DATA:  End-stage renal disease, dialysis catheter placement. EXAM: PORTABLE CHEST 1 VIEW COMPARISON:  Chest radiograph July 15, 2016 FINDINGS: Large for dialysis catheter via inferior vena cava, distal tip projecting at brachiocephalic confluence, advanced from prior imaging. Cardiac silhouette is mildly enlarged. Mediastinal silhouette is nonsuspicious. Pulmonary vascular congestion. RIGHT midlung zone atelectasis . Bones. Single lead LEFT cardiac defibrillator. Moderate degenerative change of bilateral shoulders. IMPRESSION: Dialysis catheter distal tip projects at brachiocephalic confluence. Mild cardiomegaly and pulmonary vascular congestion. Electronically Signed   By: Awilda Metroourtnay  Bloomer M.D.   On: 07/20/2016 18:51   Medications:   . atorvastatin  10 mg Oral QHS  . budesonide  0.25 mg Nebulization BID  . carvedilol  3.125 mg Oral BID WC  . famotidine  20 mg Oral QHS  . heparin  5,000 Units Subcutaneous Q8H  . insulin aspart  0-5 Units Subcutaneous QHS  . insulin aspart  0-9 Units Subcutaneous TID WC  . insulin glargine  10 Units Subcutaneous QHS  . lactose free nutrition  237 mL Oral Q1500  . lidocaine (PF)  10 mL Intradermal Once  . mirtazapine  15 mg Oral QHS  . multivitamin with minerals  1 tablet Oral QHS  . saccharomyces boulardii  250 mg Oral BID  . sertraline  25 mg Oral Daily  . sodium chloride flush  3 mL Intravenous Q12H  . vancomycin  125 mg Oral Q6H      Assessment/Plan: 1. Abdominal pain/nausea/ wt loss-CT abd showed enlarged liver, normal GB/ panc/ bowel, some ascites/ anasarca, marked cardiomegaly.  GI seen, Cdif felt to be the issue.  May need fecal Tx.  2. Vol excess - sig vol overload, chipping away w daily HD. BP's on the low side, on low dose coreg for CM 3. ESRDTTS- HD today off schedule. Will have short HD tomorrow to get back on schedule tomorrow. K+ 3.6. 4.0 K bath.   4.  Anemia- hgb 9.7 - stable - continue ESA - dosing slightly lower at Aranesp 150 given Monday x 1 off schedule 5.  Metabolic bone disease- No VDRA or binders for now P iPTH ok 6.  Nutrition- Albumin 1.9 renal diet/prostat/renal vit.  7.  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  1/2 + BC pseudomonas aeruginosa- TDC replaced 07/20/16 per Dr. Imogene Burn. ID believes this is a contaminant (1+ cx from HD cath, peripheral cx neg, no fever/ no ^wbc) 9.  CM EF 35-40% - on low dose coreg, may have to hold if BP's drop w lower dry wt. Has AICD. Formerly followed by  10. Debility: Has difficulty walking/transferring  D/T Charcot foot/has issues getting into HD chair. Will contact MSW at clinic.    Disp- Plant to DC back to [facility] tomorrow after HD   [employer]. Brown NP-C 07/21/2016, 9:20 AM  [facility] [phone]  Pt seen, examined and agree w A/P as above.  Brandy Moselle MD [employer] pager [phone]    cell [phone] 07/21/2016, 10:12 AM

## 2016-07-21 NOTE — Progress Notes (Addendum)
PROGRESS NOTE    Brandy Dudley  ZOX:[id number] DOB: 10/26/1951 DOA: 07/15/2016 PCP: [name], MD   Chief Complaint  Patient presents with  . Shortness of Breath    Brief Narrative:  HPI On 07/15/2016 by Ms. [name], NP Brandy Dudley is a 65 y.o. female with medical history significant for chronic kidney disease on dialysis, history of recurrent C. Difficile colitis (initial treatment April 2017 with second treatment June 2017), umbilical hernia, OSA, diabetes, AICD in place, physical deconditioning, anemia of chronic kidney disease, Charcot foot who presented to the ER after developing shortness of breath during dialysis treatment and inability to tolerate outpatient dialysis treatment due to same. Patient reports that this past Thursday she missed a dialysis treatment to have an outpatient abdominal ultrasound obtained. It is noted she was unable to complete treatment today. Patient has already been evaluated by the nephrology team on the ER and despite having a negative chest x-ray clinical exam is consistent with volume overload so plans are to pursue dialysis as soon as possible today.  In addition to the above complaints patient has also been having issues with postprandial right upper quadrant abdominal pain associated with nausea and vomiting, very limited oral intake and subsequent 14 pound weight loss. This is documented in the nursing home. Patient confirms. Patient reports he still makes urine. She denies recently being told that she has had fevers and she denies chills. She is not having any diarrhea. She reports nonambulatory secondary to difficulty ambulating on right Charcot foot.  Assessment & Plan   Bacteremia -Pseudomonas bacteremia 1/2 blood cultures 07/15/16 -patient initially presented with RUQ abdominal pain and vague generalized symptoms. Blood cultures from 07/18/2016 show no growth to date -Infectious disease consulted and appreciated, discontinued cefepime,  continue PO vancomycin.  Felt pseudomonas is a contaminent  Volume overload/ESRD on dialysis  -Appears to be improving -Patient presents with shortness of breath in setting of missed dialysis with clinical exam consistent with volume overload -Had urgent dialysis admission with removal of 3 L of fluids -Oxygen prn and nightly at at bedtime -Vascular surgery placed Right tunneled dialysis catheter -Vascular surgery ordered RUE vein mapping and will follow up set up further as an outpatient -patient dialyzed today, will dialyze tomorrow to get back on TTS schedule  History of Clostridium difficile colitis/RUQ pain -Patient has been having issues with recurrent nausea and vomiting and right upper quadrant abdominal pain with weight loss -Had abdominal ultrasound as an outpatient-results unknown -No diarrhea, CT scan of abdomen/pelvis was done showed no colitis. -Gastroenterology consulted and appreciated, agreed with PO vancomycin and outpatient follow up with Dr. Dulce Sellar in one month. If Cdiff reoccurs, may need fecal transplant.  Leukocytosis -See above regarding possible colitis vs recent surgery -Not sure what it was but probably stress de-margination, this is resolved overnight. 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6 and 10.2, currently 9.7  Umbilical hernia without obstruction or gangrene -Hernia unremarkable on today's exam  DVT Prophylaxis  Heparin  Code Status: Full  Family Communication: None at bedside  Disposition Plan: Admitted. Plan for HD tomorrow, discharge to SNF on 8/26  Consultants Nephrology Infectious disease Gastroenterology Vascular surgery   Procedures  Placement of right femoral vein tunneled dialysis catheter  Antibiotics   Anti-infectives    Start     Dose/Rate Route Frequency Ordered Stop   07/18/16 1800  ceFEPIme (MAXIPIME) 2 g in dextrose 5 % 50 mL IVPB  Status:  Discontinued     2 g 100 mL/hr over 30 Minutes Intravenous Every T-Th-Sa (1800) 07/17/16 0339 07/18/16 0932   07/18/16 1000  ceFEPIme (MAXIPIME) 1 g in dextrose 5 % 50 mL IVPB  Status:  Discontinued     1 g 100 mL/hr over 30 Minutes Intravenous Every 24 hours 07/18/16 0932 07/19/16 1703   07/17/16 1800  vancomycin (VANCOCIN) 50 mg/mL oral solution 125 mg     125 mg Oral Every 6 hours 07/17/16 1615     07/17/16 0345  ceFEPIme (MAXIPIME) 2 g in dextrose 5 % 50 mL IVPB     2 g 100 mL/hr over 30 Minutes Intravenous  Once 07/17/16 0339 07/17/16 0544      Subjective:   Brandy Dudley seen and examined today.  Patient would like some chocolate. Feels abdominal pain and nausea are improving.  Feels shortness of breath has improved.  Denies chest pain, dizziness, headache.   Objective:   Vitals:   07/21/16 1000 07/21/16 1030 07/21/16 1100 07/21/16 1116  BP: 103/63 98/62 (!) 91/56 (!) 99/57  Pulse: 81 79 77 79  Resp:    18  Temp:    97.9 F (36.6 C)  TempSrc:    Oral  SpO2:    95%  Weight:    76.6 kg (168 lb 14 oz)  Height:        Intake/Output Summary (Last 24 hours) at 07/21/16 1245 Last data filed at 07/21/16 1116  Gross per 24 hour  Intake                0 ml  Output             5883 ml  Net            -5883 ml   Filed  Weights   07/20/16 2129 07/21/16 0740 07/21/16 1116  Weight: 81.3 kg (179 lb 3.2 oz) 80 kg (176 lb 5.9 oz) 76.6 kg (168 lb 14 oz)    Exam  General: Well developed, well nourished, No distress  HEENT: NCAT,  mucous membranes moist.   Cardiovascular: S1 S2 auscultated, RRR  Respiratory: Diminished breath sounds, mild crackles noted in the lung bases.  Abdomen: Soft,RUQ TTP, nondistended, + bowel sounds, umbilical hernia  Extremities: warm dry without cyanosis clubbing   Neuro: AAOx3, nonfocal  Psych: Appropriate mood and affect, pleasant   Data Reviewed: I have personally reviewed following labs and imaging studies  CBC:  Recent Labs Lab 07/15/16 1331 07/16/16 0546 07/17/16 1400 07/19/16 0452 07/20/16 0659 07/21/16 0444  WBC 13.3* 8.9 9.8 8.7 9.5 12.5*  NEUTROABS 11.0*  --   --   --   --   --   HGB 10.2* 10.0* 10.1* 9.9* 10.2* 9.7*  HCT 31.4* 31.5* 31.7* 31.5* 32.5* 30.8*  MCV 78.1 77.6* 77.9* 77.4* 78.9 78.4  PLT 333 220 223 168 192 125*   Basic Metabolic Panel:  Recent Labs Lab  07/15/16 1800 07/16/16 0545 07/17/16 1400 07/19/16 0452 07/20/16 0659 07/21/16 0444  NA 135 135 136 135 136 135  K 4.3 4.0 4.1 4.4 4.0 3.6  CL 97* 97* 100* 102 101 97*  CO2 25 26 29 25 25 27   GLUCOSE 152* 230* 124* 76 54* 82  BUN 40* 26* 40* 41* 48* 30*  CREATININE 6.36* 4.62* 5.64* 5.70* 6.06* 4.49*  CALCIUM 8.5* 8.6* 8.6* 8.4* 8.6* 8.2*  MG 2.2  --   --   --   --   --   PHOS 4.7* 4.3 4.3  --   --   --    GFR: Estimated Creatinine Clearance: 12.5 mL/min (by C-G formula based on SCr of 4.49 mg/dL). Liver Function Tests:  Recent Labs Lab 07/15/16 1800 07/16/16 0545 07/17/16 1400  ALBUMIN 2.0* 1.9* 1.9*   No results for input(s): LIPASE, AMYLASE in the last 168 hours. No results for input(s): AMMONIA in the last 168 hours. Coagulation Profile:  Recent Labs Lab 07/15/16 1331  INR 1.85   Cardiac Enzymes: No results for input(s): CKTOTAL, CKMB, CKMBINDEX,  TROPONINI in the last 168 hours. BNP (last 3 results) No results for input(s): PROBNP in the last 8760 hours. HbA1C: No results for input(s): HGBA1C in the last 72 hours. CBG:  Recent Labs Lab 07/20/16 0824 07/20/16 1138 07/20/16 1208 07/20/16 2126 07/21/16 1202  GLUCAP 75 51* 121* 83 120*   Lipid Profile: No results for input(s): CHOL, HDL, LDLCALC, TRIG, CHOLHDL, LDLDIRECT in the last 72 hours. Thyroid Function Tests: No results for input(s): TSH, T4TOTAL, FREET4, T3FREE, THYROIDAB in the last 72 hours. Anemia Panel: No results for input(s): VITAMINB12, FOLATE, FERRITIN, TIBC, IRON, RETICCTPCT in the last 72 hours. Urine analysis:    Component Value Date/Time   COLORURINE AMBER (A) 07/15/2016 1636   APPEARANCEUR TURBID (A) 07/15/2016 1636   LABSPEC 1.025 07/15/2016 1636   PHURINE 5.0 07/15/2016 1636   GLUCOSEU NEGATIVE 07/15/2016 1636   HGBUR MODERATE (A) 07/15/2016 1636   BILIRUBINUR MODERATE (A) 07/15/2016 1636   KETONESUR 15 (A) 07/15/2016 1636   PROTEINUR 100 (A) 07/15/2016 1636   NITRITE NEGATIVE 07/15/2016 1636   LEUKOCYTESUR MODERATE (A) 07/15/2016 1636   Sepsis Labs: @LABRCNTIP (procalcitonin:4,lacticidven:4)  ) Recent Results (from the past 240 hour(s))  Urine culture     Status: None   Collection Time: 07/15/16  4:36 PM  Result Value Ref Range Status   Specimen Description URINE, CATHETERIZED  Final   Special Requests NONE  Final   Culture NO GROWTH  Final   Report Status 07/16/2016 FINAL  Final  Culture, blood (Routine X 2) w Reflex to ID Panel     Status: Abnormal   Collection Time: 07/15/16  6:20 PM  Result Value Ref Range Status   Specimen Description BLOOD VENOUS CATHETER  Final   Special Requests BOTTLES DRAWN AEROBIC AND ANAEROBIC 5CC  Final   Culture  Setup Time   Final    AEROBIC BOTTLE ONLY GRAM NEGATIVE RODS CRITICAL RESULT CALLED TO, READ BACK BY AND VERIFIED WITH: TO JLEDGFORD(PHARD) BY TCLEVELAND 07/17/2016 AT 3:29    Culture  PSEUDOMONAS AERUGINOSA (A)  Final   Report Status 07/19/2016 FINAL  Final   Organism ID, Bacteria PSEUDOMONAS AERUGINOSA  Final      Susceptibility   Pseudomonas aeruginosa - MIC*    CEFTAZIDIME 4 SENSITIVE Sensitive     CIPROFLOXACIN <=0.25 SENSITIVE Sensitive     GENTAMICIN <=1 SENSITIVE Sensitive     IMIPENEM 2 SENSITIVE Sensitive  PIP/TAZO 8 SENSITIVE Sensitive     CEFEPIME 2 SENSITIVE Sensitive     * PSEUDOMONAS AERUGINOSA  Culture, blood (Routine X 2) w Reflex to ID Panel     Status: None   Collection Time: 07/15/16  6:20 PM  Result Value Ref Range Status   Specimen Description BLOOD ARTERIAL CATHETER  Final   Special Requests BOTTLES DRAWN AEROBIC AND ANAEROBIC 5CC  Final   Culture NO GROWTH 5 DAYS  Final   Report Status 07/20/2016 FINAL  Final  Blood Culture ID Panel (Reflexed)     Status: Abnormal   Collection Time: 07/15/16  6:20 PM  Result Value Ref Range Status   Enterococcus species NOT DETECTED NOT DETECTED Final   Vancomycin resistance NOT DETECTED NOT DETECTED Final   Listeria monocytogenes NOT DETECTED NOT DETECTED Final   Staphylococcus species NOT DETECTED NOT DETECTED Final   Staphylococcus aureus NOT DETECTED NOT DETECTED Final   Methicillin resistance NOT DETECTED NOT DETECTED Final   Streptococcus species NOT DETECTED NOT DETECTED Final   Streptococcus agalactiae NOT DETECTED NOT DETECTED Final   Streptococcus pneumoniae NOT DETECTED NOT DETECTED Final   Streptococcus pyogenes NOT DETECTED NOT DETECTED Final   Acinetobacter baumannii NOT DETECTED NOT DETECTED Final   Enterobacteriaceae species NOT DETECTED NOT DETECTED Final   Enterobacter cloacae complex NOT DETECTED NOT DETECTED Final   Escherichia coli NOT DETECTED NOT DETECTED Final   Klebsiella oxytoca NOT DETECTED NOT DETECTED Final   Klebsiella pneumoniae NOT DETECTED NOT DETECTED Final   Proteus species NOT DETECTED NOT DETECTED Final   Serratia marcescens NOT DETECTED NOT DETECTED Final    Carbapenem resistance NOT DETECTED NOT DETECTED Final   Haemophilus influenzae NOT DETECTED NOT DETECTED Final   Neisseria meningitidis NOT DETECTED NOT DETECTED Final   Pseudomonas aeruginosa DETECTED (A) NOT DETECTED Final    Comment: CRITICAL RESULT CALLED TO, READ BACK BY AND VERIFIED WITH: TO JLEDGFORD(PHARD) BY TCLEVELAND 07/17/2016 AT 3:29    Candida albicans NOT DETECTED NOT DETECTED Final   Candida glabrata NOT DETECTED NOT DETECTED Final   Candida krusei NOT DETECTED NOT DETECTED Final   Candida parapsilosis NOT DETECTED NOT DETECTED Final   Candida tropicalis NOT DETECTED NOT DETECTED Final  MRSA PCR Screening     Status: None   Collection Time: 07/16/16 12:37 AM  Result Value Ref Range Status   MRSA by PCR NEGATIVE NEGATIVE Final    Comment:        The GeneXpert MRSA Assay (FDA approved for NASAL specimens only), is one component of a comprehensive MRSA colonization surveillance program. It is not intended to diagnose MRSA infection nor to guide or monitor treatment for MRSA infections.   Cath Tip Culture     Status: None (Preliminary result)   Collection Time: 07/18/16  9:00 AM  Result Value Ref Range Status   Specimen Description CATH TIP  Final   Special Requests NONE  Final   Culture NO GROWTH 2 DAYS  Final   Report Status PENDING  Incomplete  Culture, blood (Routine X 2) w Reflex to ID Panel     Status: None (Preliminary result)   Collection Time: 07/18/16  1:15 PM  Result Value Ref Range Status   Specimen Description BLOOD RIGHT HAND  Final   Special Requests BOTTLES DRAWN AEROBIC ONLY 5CC  Final   Culture NO GROWTH 2 DAYS  Final   Report Status PENDING  Incomplete  Culture, blood (Routine X 2) w Reflex to ID Panel  Status: None (Preliminary result)   Collection Time: 07/18/16  1:21 PM  Result Value Ref Range Status   Specimen Description BLOOD RIGHT HAND  Final   Special Requests BOTTLES DRAWN AEROBIC AND ANAEROBIC 4CC EA  Final   Culture NO GROWTH 2  DAYS  Final   Report Status PENDING  Incomplete      Radiology Studies: Dg Chest Port 1 View  Result Date: 07/20/2016 CLINICAL DATA:  End-stage renal disease, dialysis catheter placement. EXAM: PORTABLE CHEST 1 VIEW COMPARISON:  Chest radiograph July 15, 2016 FINDINGS: Large for dialysis catheter via inferior vena cava, distal tip projecting at brachiocephalic confluence, advanced from prior imaging. Cardiac silhouette is mildly enlarged. Mediastinal silhouette is nonsuspicious. Pulmonary vascular congestion. RIGHT midlung zone atelectasis . Bones. Single lead LEFT cardiac defibrillator. Moderate degenerative change of bilateral shoulders. IMPRESSION: Dialysis catheter distal tip projects at brachiocephalic confluence. Mild cardiomegaly and pulmonary vascular congestion. Electronically Signed   By: Awilda Metroourtnay  Bloomer M.D.   On: 07/20/2016 18:51     Scheduled Meds: . atorvastatin  10 mg Oral QHS  . budesonide  0.25 mg Nebulization BID  . carvedilol  3.125 mg Oral BID WC  . famotidine  20 mg Oral QHS  . feeding supplement (PRO-STAT SUGAR FREE 64)  30 mL Oral BID  . heparin  5,000 Units Subcutaneous Q8H  . insulin aspart  0-5 Units Subcutaneous QHS  . insulin aspart  0-9 Units Subcutaneous TID WC  . insulin glargine  10 Units Subcutaneous QHS  . lactose free nutrition  237 mL Oral Q1500  . lidocaine (PF)  10 mL Intradermal Once  . mirtazapine  15 mg Oral QHS  . multivitamin with minerals  1 tablet Oral QHS  . saccharomyces boulardii  250 mg Oral BID  . sertraline  25 mg Oral Daily  . sodium chloride flush  3 mL Intravenous Q12H  . vancomycin  125 mg Oral Q6H   Continuous Infusions:    LOS: 4 days   Time Spent in minutes   30 minutes  Nicki Furlan D.O. on 07/21/2016 at 12:45 PM  Between 7am to 7pm - Pager - [phone]  After 7pm go to [email or web address] - password [email or web address]  And look for the night coverage person covering for me after hours  [facility] Office   [phone]

## 2016-07-21 NOTE — Progress Notes (Signed)
Nutrition Follow-up  DOCUMENTATION CODES:   Severe malnutrition in context of chronic illness, Obesity unspecified  INTERVENTION:  Continue Boost Plus po once daily, each supplement provides 360 kcal and 14 grams of protein.  Continue 30 ml Prostat po BID, each supplement provides 100 kcal and 15 grams of protein.   Encourage adequate PO intake.   NUTRITION DIAGNOSIS:   Malnutrition related to chronic illness as evidenced by percent weight loss, energy intake < or equal to 75% for > or equal to 1 month; ongoing  GOAL:   Patient will meet greater than or equal to 90% of their needs; met  MONITOR:   PO intake, Supplement acceptance, Labs, Weight trends, Skin, I & O's  REASON FOR ASSESSMENT:   Consult Assessment of nutrition requirement/status  ASSESSMENT:   65 y.o. female with medical history significant for chronic kidney disease on dialysis, history of recurrent C. Difficile colitis (initial treatment April 2017 with second treatment June 2202), umbilical hernia, OSA, diabetes, AICD in place, physical deconditioning, anemia of chronic kidney disease, Charcot foot who presented to the ER after developing shortness of breath during dialysis treatment and inability to tolerate outpatient dialysis treatment   Meal completion has been 65-100%. Nausea and abdominal pain has been improving. Pt currently has Boost Plus and Prostat ordered. RD to continue with orders to aid in caloric and protein needs. Encouraged adequate PO intake.   Labs and medications reviewed.   Diet Order:  Diet renal with fluid restriction Fluid restriction: 1200 mL Fluid; Room service appropriate? Yes; Fluid consistency: Thin  Skin:  Reviewed, no issues  Last BM:  8/23  Height:   Ht Readings from Last 1 Encounters:  07/19/16 _0  (1.626 m)    Weight:   Wt Readings from Last 1 Encounters:  07/21/16 168 lb 14 oz (76.6 kg)    Ideal Body Weight:  54.5 kg  BMI:  Body mass index is 28.99  kg/m.  Estimated Nutritional Needs:   Kcal:  5427-0623  Protein:  95-105 grams  Fluid:  1.2 L/day  EDUCATION NEEDS:   Education needs addressed  Corrin Parker, MS, RD, LDN Pager # [phone] After hours/ weekend pager # [phone]

## 2016-07-21 NOTE — Progress Notes (Signed)
  Progress Note    07/21/2016 10:34 AM 1 Day Post-Op  Subjective:  Feeling ok on hd this a.m.  Vitals:   07/21/16 0930 07/21/16 1000  BP: 97/67 103/63  Pulse: Brandy 81  Resp:    Temp:      Physical Exam: Weak thrill in proximal avf only in lue R fem tdc site cdi  CBC    Component Value Date/Time   WBC 12.5 (H) 07/21/2016 0444   RBC 3.93 07/21/2016 0444   HGB 9.7 (L) 07/21/2016 0444   HCT 30.8 (L) 07/21/2016 0444   PLT 125 (L) 07/21/2016 0444   MCV 78.4 07/21/2016 0444   MCH 24.7 (L) 07/21/2016 0444   MCHC 31.5 07/21/2016 0444   RDW 19.1 (H) 07/21/2016 0444   LYMPHSABS 1.1 07/15/2016 1331   MONOABS 0.9 07/15/2016 1331   EOSABS 0.3 07/15/2016 1331   BASOSABS 0.0 07/15/2016 1331    BMET    Component Value Date/Time   NA 135 07/21/2016 0444   NA 143 06/01/2016   K 3.6 07/21/2016 0444   CL 97 (L) 07/21/2016 0444   CO2 27 07/21/2016 0444   GLUCOSE 82 07/21/2016 0444   BUN 30 (H) 07/21/2016 0444   BUN 52 (A) 06/01/2016   CREATININE 4.49 (H) 07/21/2016 0444   CALCIUM 8.2 (L) 07/21/2016 0444   GFRNONAA 9 (L) 07/21/2016 0444   GFRAA 11 (L) 07/21/2016 0444    INR    Component Value Date/Time   INR 1.85 07/15/2016 1331     Intake/Output Summary (Last 24 hours) at 07/21/16 1034 Last data filed at 07/21/16 0606  Gross per 24 hour  Intake                0 ml  Output             2356 ml  Net            -2356 ml     Assessment:  65 y.o. Dudley is here with bacteremia from left femoral tdc now on hd via newly placed R fem tdc. Left arm avf appears non-functional despite multiple interventions.  Plan: -will get RUE vein mapping  -noted plans for discharge home and will set up surgery as outpatient   Zander Ingham C. Randie Heinz, MD [facility] Office: [phone] Pager: [phone]  07/21/2016 10:34 AM

## 2016-07-21 NOTE — Progress Notes (Signed)
[facility] PROGRESS NOTE Subjective patient's systemic antibiotics stopped by ID. Blood culture felt to be contaminated. Patient has been on oral vancomycin. She notes that he is not having diarrhea but did have several bowel movements noted yesterday. Her abdominal pain has gotten persistently better since resuming vancomycin according to the patient she had her graft revise yesterday as well as hemodialysis  Objective: Vital signs in last 24 hours: Temp:  [97.5 F (36.4 C)-98.4 F (36.9 C)] 98.4 F (36.9 C) (08/25 0432) Pulse Rate:  [64-99] 74 (08/25 0432) Resp:  [0-22] 16 (08/25 0432) BP: (82-120)/(47-76) 106/47 (08/25 0432) SpO2:  [0 %-100 %] 96 % (08/25 0432) Weight:  [81.3 kg (179 lb 3.2 oz)-85.2 kg (187 lb 13.3 oz)] 81.3 kg (179 lb 3.2 oz) (08/24 2129) Last BM Date: 07/19/16  Intake/Output from previous day: 08/24 0701 - 08/25 0700 In: 0  Out: 2356  Intake/Output this shift: No intake/output data recorded.  PE: General-- no acute distress watching television  Abdomen-- nondistended probable ascites excellent bowel sounds minimal tender  Lab Results:  Recent Labs  07/19/16 0452 07/20/16 0659 07/21/16 0444  WBC 8.7 9.5 12.5*  HGB 9.9* 10.2* 9.7*  HCT 31.5* 32.5* 30.8*  PLT 168 192 125*   BMET  Recent Labs  07/19/16 0452 07/20/16 0659 07/21/16 0444  NA 135 136 135  K 4.4 4.0 3.6  CL 102 101 97*  CO2 25 25 27   CREATININE 5.70* 6.06* 4.49*   LFT No results for input(s): PROT, AST, ALT, ALKPHOS, BILITOT, BILIDIR, IBILI in the last 72 hours. PT/INR No results for input(s): LABPROT, INR in the last 72 hours. PANCREAS No results for input(s): LIPASE in the last 72 hours.       Studies/Results: Dg Chest Port 1 View  Result Date: 07/20/2016 CLINICAL DATA:  End-stage renal disease, dialysis catheter placement. EXAM: PORTABLE CHEST 1 VIEW COMPARISON:  Chest radiograph July 15, 2016 FINDINGS: Large for dialysis catheter via inferior vena  cava, distal tip projecting at brachiocephalic confluence, advanced from prior imaging. Cardiac silhouette is mildly enlarged. Mediastinal silhouette is nonsuspicious. Pulmonary vascular congestion. RIGHT midlung zone atelectasis . Bones. Single lead LEFT cardiac defibrillator. Moderate degenerative change of bilateral shoulders. IMPRESSION: Dialysis catheter distal tip projects at brachiocephalic confluence. Mild cardiomegaly and pulmonary vascular congestion. Electronically Signed   By: Awilda Metro M.D.   On: 07/20/2016 18:51    Medications: I have reviewed the patient's current medications.  Assessment/Plan: 1. Abdominal pain/recurrent C. difficile. She does have a somewhat tender umbilical hernia and this could be contributing. I would recommend as did Dr. Orvan Falconer a complete course of vancomycin and then would recommend a taper over the next several weeks. 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 From our viewpoint she could go home on the vancomycin and follow-up with Dr. Dulce Sellar in one month is an outpatient.   Mariapaula Krist JR,Jamelah Sitzer L 07/21/2016, 7:21 AM  This note was created using voice recognition software. Minor errors may Have occurred unintentionally.  Pager: [phone] If no answer or after hours call [phone]

## 2016-07-22 ENCOUNTER — Encounter (HOSPITAL_COMMUNITY): Payer: Self-pay | Admitting: *Deleted

## 2016-07-22 LAB — GLUCOSE, CAPILLARY
GLUCOSE-CAPILLARY: 67 mg/dL (ref 65–99)
GLUCOSE-CAPILLARY: 90 mg/dL (ref 65–99)
Glucose-Capillary: 66 mg/dL (ref 65–99)

## 2016-07-22 LAB — RENAL FUNCTION PANEL
Albumin: 2 g/dL — ABNORMAL LOW (ref 3.5–5.0)
Anion gap: 9 (ref 5–15)
BUN: 12 mg/dL (ref 6–20)
CO2: 24 mmol/L (ref 22–32)
Calcium: 8.2 mg/dL — ABNORMAL LOW (ref 8.9–10.3)
Chloride: 102 mmol/L (ref 101–111)
Creatinine, Ser: 2.74 mg/dL — ABNORMAL HIGH (ref 0.44–1.00)
GFR calc Af Amer: 20 mL/min — ABNORMAL LOW (ref >60)
GFR calc non Af Amer: 17 mL/min — ABNORMAL LOW (ref >60)
Glucose, Bld: 64 mg/dL — ABNORMAL LOW (ref 65–99)
Phosphorus: 1.7 mg/dL — ABNORMAL LOW (ref 2.5–4.6)
Potassium: 3.5 mmol/L (ref 3.5–5.1)
Sodium: 135 mmol/L (ref 135–145)

## 2016-07-22 LAB — BASIC METABOLIC PANEL
Anion gap: 8 (ref 5–15)
BUN: 18 mg/dL (ref 6–20)
CALCIUM: 8.2 mg/dL — AB (ref 8.9–10.3)
CHLORIDE: 100 mmol/L — AB (ref 101–111)
CO2: 29 mmol/L (ref 22–32)
CREATININE: 3.82 mg/dL — AB (ref 0.44–1.00)
GFR calc non Af Amer: 11 mL/min — ABNORMAL LOW (ref >60)
GFR, EST AFRICAN AMERICAN: 13 mL/min — AB (ref >60)
GLUCOSE: 68 mg/dL (ref 65–99)
Potassium: 5 mmol/L (ref 3.5–5.1)
Sodium: 137 mmol/L (ref 135–145)

## 2016-07-22 LAB — CBC
HCT: 32.8 % — ABNORMAL LOW (ref 36.0–46.0)
HEMATOCRIT: 32.6 % — AB (ref 36.0–46.0)
HEMOGLOBIN: 10.1 g/dL — AB (ref 12.0–15.0)
Hemoglobin: 10.4 g/dL — ABNORMAL LOW (ref 12.0–15.0)
MCH: 24.5 pg — ABNORMAL LOW (ref 26.0–34.0)
MCH: 25.1 pg — ABNORMAL LOW (ref 26.0–34.0)
MCHC: 31 g/dL (ref 30.0–36.0)
MCHC: 31.7 g/dL (ref 30.0–36.0)
MCV: 79.1 fL (ref 78.0–100.0)
MCV: 79 fL (ref 78.0–100.0)
Platelets: 128 10*3/uL — ABNORMAL LOW (ref 150–400)
Platelets: 90 10*3/uL — ABNORMAL LOW (ref 150–400)
RBC: 4.12 MIL/uL (ref 3.87–5.11)
RBC: 4.15 MIL/uL (ref 3.87–5.11)
RDW: 19.4 % — AB (ref 11.5–15.5)
RDW: 19.4 % — ABNORMAL HIGH (ref 11.5–15.5)
WBC: 9.8 10*3/uL (ref 4.0–10.5)
WBC: 11.5 10*3/uL — ABNORMAL HIGH (ref 4.0–10.5)

## 2016-07-22 MED ORDER — VANCOMYCIN 50 MG/ML ORAL SOLUTION: 125.0000 mg | Freq: Four times a day (QID) | ORAL | 0 refills | Status: DC

## 2016-07-22 MED ORDER — GLUCOSE 40 % PO GEL
ORAL | Status: AC
  Administered 2016-07-22: 12:00:00
  Filled 2016-07-22: qty 1

## 2016-07-22 MED ORDER — OXYCODONE-ACETAMINOPHEN 5-325 MG PO TABS: 1.0000 | ORAL_TABLET | ORAL | 0 refills | Status: DC | PRN

## 2016-07-22 MED ORDER — FAMOTIDINE 20 MG PO TABS: 20.0000 mg | ORAL_TABLET | Freq: Every day | ORAL | Status: DC

## 2016-07-22 MED ORDER — BOOST PLUS PO LIQD: 237.0000 mL | Freq: Every day | ORAL | 0 refills | Status: DC

## 2016-07-22 MED ORDER — LORAZEPAM 0.5 MG PO TABS: 0.5000 mg | ORAL_TABLET | Freq: Two times a day (BID) | ORAL | 0 refills | Status: DC | PRN

## 2016-07-22 NOTE — Progress Notes (Signed)
Contacted ambulance service to transport patient to SN[facility]).  Peri Maris, MBA, BSN, RN

## 2016-07-22 NOTE — Discharge Summary (Signed)
Physician Discharge Summary  Brandy Dudley ZOX:[id number] DOB: 18-Jul-1951 DOA: 07/15/2016  PCP: [name], MD  Admit date: 07/15/2016 Discharge date: 07/22/2016  Time spent: 45 minutes  Recommendations for Outpatient Follow-up:  Patient will be discharged to [facility]  Patient will need to follow up with primary care provider within one week of discharge.  Continue dialysis as scheduled. Follow up with Dr. Dulce Sellar, gastroenterologist in one week.  Follow up with Dr. Imogene Burn, vascular surgeon. Patient should continue medications as prescribed.  Patient should follow a Renal diet with fluid restriction per day diet.   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)   Anemia of chronic renal failure, stage 5 (HCC)   RUQ pain   Chronic combined systolic and diastolic CHF (congestive heart failure) (HCC)   Umbilical hernia without obstruction or gangrene   Loss of weight   Volume overload   Leukocytosis   Atherosclerotic peripheral vascular disease (HCC)   Bacteremia   Protein-calorie malnutrition, severe   Discharge Condition: Stable  Diet recommendation: Renal diet with fluid restriction per day  [facility] Weights   07/21/16 2038 07/22/16 0700 07/22/16 0938  Weight: 77.1 kg (169 lb 15.6 oz) 75.7 kg (166 lb 14.2 oz) 73.4 kg (161 lb 13.1 oz)    History of present illness:  On 07/15/2016 by Ms. [name], NP Hurman Horn a 65 y.o.femalewith medical history  significant for chronic kidney disease on dialysis, history of recurrent C. Difficile colitis (initial treatment April 2017 with second treatment June 2017), umbilical hernia, OSA, diabetes, AICD in place, physical deconditioning, anemia of chronic kidney disease, Charcot foot who presented to the ER after developing shortness of breath during dialysis treatment and inability to tolerate outpatient dialysis treatment due to same. Patient reports that this past Thursday she missed a dialysis treatment to have an outpatient abdominal ultrasound obtained. It is noted she was unable to complete treatment today. Patient has already been evaluated by the nephrology team on the ER and despite having a negative chest x-ray clinical exam is consistent with volume overload so plans are to pursue dialysis as soon as possible today.  In addition to the above complaints patient has also been having issues with postprandial right upper quadrant abdominal pain associated with nausea and vomiting, very limited oral intake and subsequent 14 pound weight loss. This is documented in the nursing home. Patient confirms. Patient reports he still makes urine. She denies recently being told that she has had fevers and she denies chills. She is not having any diarrhea. She reports nonambulatory secondary to difficulty ambulating on right Charcot foot.  Hospital Course:  Bacteremia -Pseudomonas bacteremia 1/2 blood cultures 07/15/16 -patient initially presented with RUQ abdominal pain and vague generalized symptoms. Blood cultures from 07/18/2016 show no growth to date -Infectious disease consulted and appreciated, discontinued cefepime, continue PO vancomycin.  Felt pseudomonas is a contaminent  Volume overload/ESRD on dialysis  -Appears to be improving -Patient presents with shortness of breath in setting of missed dialysis with clinical exam consistent with  volume overload -Had urgent dialysis admissionwith removal of 3 L  of fluids -Oxygen prn and nightly at at bedtime -Vascular surgery placed Right tunneled dialysis catheter -Vascular surgery ordered RUE vein mapping and will follow up set up further as an outpatient -patient dialyzed today, resume TTS schedule  History of Clostridium difficile colitis/RUQ pain -Patient has been having issues with recurrent nausea and vomiting and right upper quadrant abdominal pain with weight loss -ID recommended Vancomycin for additional week -Had abdominal ultrasound as an outpatient-results unknown -No diarrhea, CT scan of abdomen/pelvis was done showed no colitis. -Gastroenterology consulted and appreciated, agreed with PO vancomycin and outpatient follow up with Dr. Dulce Sellar in one month. If Cdiff reoccurs, may need fecal transplant.  Leukocytosis -Resolved, See above regarding possible colitis vs recent surgery -Not sure what it was but probably stress de-margination, this is resolved overnight.  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  -Was placed on Lantus, sliding scale insulin, CBG monitoring during hospitalization -Continue home regimen at discharge  Weight loss/Severe malnutrition -As above -Nutrition consultation -Continue feeding supplements  Physical deconditioning -PT and OT evaluation- recommended SNF -May benefit from some sort of boot for offloading device for right Charcot foot to aid in improving mobilization  Essential hypertension -Blood pressure currently controlled  AICD (automatic cardioverter/defibrillator) present/history of systolic heart failure -Management with regular dialysis treatments -Continue other medical therapy with carvedilol  Anemia of chronic renal failure, stage 5  -Baseline hemoglobin between 9.6 and 10.2, currently 10.1  Umbilical hernia without obstruction or gangrene -Hernia unremarkable on today's  exam  Consultants Nephrology Infectious disease Gastroenterology Vascular surgery   Procedures  Placement of right femoral vein tunneled dialysis catheter  Discharge Exam: Vitals:   07/22/16 0938 07/22/16 1021  BP: 106/60 108/60  Pulse: 79 77  Resp: 18 18  Temp: 98.2 F (36.8 C) 98 F (36.7 C)    Exam  General: Well developed, well nourished, No distress  HEENT: NCAT,  mucous membranes moist.   Cardiovascular: S1 S2 auscultated, RRR  Respiratory: Diminished breath sounds, mild crackles noted in the lung bases.  Abdomen: Soft,RUQ TTP, nondistended, + bowel sounds, umbilical hernia  Extremities: warm dry without cyanosis clubbing   Neuro: AAOx3, nonfocal  Psych: Appropriate mood and affect, pleasant  Discharge Instructions Discharge Instructions    Discharge instructions    Complete by:  As directed   Patient will be discharged to [facility].  Patient will need to follow up with primary care provider within one week of discharge.  Continue dialysis as scheduled. Follow up with Dr. Dulce Sellar, gastroenterologist in one week.  Patient should continue medications as prescribed.  Patient should follow a Renal diet with fluid restriction per day diet.     Current Discharge Medication List    CONTINUE these medications which have CHANGED   Details  famotidine (PEPCID) 20 MG tablet Take 1 tablet (20 mg total) by mouth at bedtime.    lactose free nutrition (BOOST PLUS) LIQD Take 237 mLs by mouth daily at 3 pm. Refills: 0    LORazepam (ATIVAN) 0.5 MG tablet Take 1 tablet (0.5 mg total) by mouth 2 (two) times daily as needed for anxiety. Qty: 30 tablet, Refills: 0    oxyCODONE-acetaminophen (ROXICET) 5-325 MG tablet Take 1-2 tablets by mouth every 4 (four) hours as needed. Qty: 30 tablet, Refills: 0    vancomycin (VANCOCIN) 50 mg/mL oral solution Take 2.5  mLs (125 mg total) by mouth every 6 (six) hours. Take for one additional week Qty: 3500 mL, Refills: 0       CONTINUE these medications which have NOT CHANGED   Details  acetaminophen (TYLENOL) 325 MG tablet Take 325 mg by mouth every 6 (six) hours as needed for mild pain or fever (For fever >99.5).     albuterol (PROAIR HFA) 108 (90 Base) MCG/ACT inhaler Inhale 2 puffs into the lungs every 6 (six) hours as needed for wheezing or shortness of breath.     Amino Acids-Protein Hydrolys (FEEDING SUPPLEMENT, PRO-STAT SUGAR FREE 64,) LIQD Take 30 mLs by mouth daily.    atorvastatin (LIPITOR) 10 MG tablet Take 10 mg by mouth at bedtime.     Budesonide (PULMICORT FLEXHALER) 90 MCG/ACT inhaler Inhale 2 puffs into the lungs 2 (two) times daily.    carvedilol (COREG) 3.125 MG tablet Take 3.125 mg by mouth 2 (two) times daily with a meal. Hold for SBP <119 or HR <60    hydrOXYzine (ATARAX/VISTARIL) 50 MG tablet Take 50 mg by mouth every 6 (six) hours as needed for itching.    insulin glargine (LANTUS) 100 unit/mL SOPN Inject 0.1 mLs (10 Units total) into the skin at bedtime. Qty: 15 mL, Refills: 0    insulin lispro (HUMALOG) 100 UNIT/ML injection Inject 0-10 Units into the skin 3 (three) times daily with meals. CBG 0-59 Hypoglycemic Protocol, 60-149 0 units 150-250 5 units, 251-300 8 units, 301-350 10 units, >350 call MD    Inulin (FIBER CHOICE PO) Take 2 tablets by mouth daily as needed (constipation).    mirtazapine (REMERON) 15 MG tablet Take 1 tablet (15 mg total) by mouth at bedtime.    Multiple Vitamins-Minerals (CERTAVITE/ANTIOXIDANTS) TABS Take 1 tablet by mouth at bedtime.    OXYGEN Inhale 2 L into the lungs as needed.     saccharomyces boulardii (FLORASTOR) 250 MG capsule Take 250 mg by mouth 2 (two) times daily. For recurrent C. Diff.  Take 1 tablet (250 mg) by mouth twice daily until 2 weeks after completion of antibiotics (stop date 08/05/16    sertraline (ZOLOFT) 25 MG tablet Take 25 mg by mouth at bedtime.        Allergies  Allergen Reactions  . Entresto [Sacubitril-Valsartan] Other  (See Comments)     unknown reaction  . Sulfa Antibiotics Itching   Follow-up Information    PANDEY, MAHIMA, MD. Schedule an appointment as soon as possible for a visit in 1 week(s).   Specialty:  Internal Medicine Why:  Hospital follow up Contact information: [address] [place] [address] [phone]        Leonides SakeBrian Chen, MD. Call in 1 week(s).   Specialties:  Vascular Surgery, Cardiology Why:  For vasicular surgical follow up. Contact information: [address] [place] [address] [phone]            The results of significant diagnostics from this hospitalization (including imaging, microbiology, ancillary and laboratory) are listed below for reference.    Significant Diagnostic Studies: Ct Abdomen Pelvis Wo Contrast  Result Date: 07/16/2016 CLINICAL DATA:  Right upper quadrant pain and nausea EXAM: CT ABDOMEN AND PELVIS WITHOUT CONTRAST TECHNIQUE: Multidetector CT imaging of the abdomen and pelvis was performed following the standard protocol without IV contrast. COMPARISON:  CT abdomen/ pelvis 07/06/2016 FINDINGS: The examination is degraded by motion and the lack of intravenous contrast. Lower chest: There is massive cardiomegaly. Moderate right and small left pleural effusions. No  pulmonary nodules are identified. There is a calcified granuloma in the left lung base. Bilateral basilar atelectasis. Left femoral approach dialysis catheter terminates beyond the field of view. Hepatobiliary: The liver is enlarged with small volume perihepatic ascites. No liver lesions identified on this noncontrast study. Gallbladder appears normal. Pancreas: Normal unenhanced appearance of the pancreas. No pancreatic ductal dilatation. No peripancreatic fluid collection. Spleen: Normal spleen. Adrenals/Urinary Tract: Adrenal glands are normal. No hydronephrosis. No nephrolithiasis. Renal contours are normal. Urinary bladder is incompletely distended. Stomach/Bowel: No dilated small  bowel or other evidence of obstruction. No enteric or colonic inflammation. The appendix is not clearly visualized. The stomach is moderately distended. Fat-containing ventral abdominal hernia. Vascular/Lymphatic: There is atherosclerotic calcification within the abdominal aorta and its branch vessels. There is a left femoral vein approach central venous catheter that terminates beyond the field of view. Reproductive: There is fluid within the endometrial cavity. The ovaries are unremarkable. Other: Diffuse anasarca. Musculoskeletal: Multilevel facet arthrosis and osteophytosis. IMPRESSION: 1. No acute intra-abdominal process. No evidence of pseudomembranous colitis. 2. Hepatomegaly small volume perihepatic ascites. 3. Massive cardiomegaly with bilateral pleural effusions, right-greater-than-left, which worsened prior study. 4. Aortic atherosclerosis. Electronically Signed   By: Deatra Robinson M.D.   On: 07/16/2016 04:16   Dg Chest Port 1 View  Result Date: 07/20/2016 CLINICAL DATA:  End-stage renal disease, dialysis catheter placement. EXAM: PORTABLE CHEST 1 VIEW COMPARISON:  Chest radiograph July 15, 2016 FINDINGS: Large for dialysis catheter via inferior vena cava, distal tip projecting at brachiocephalic confluence, advanced from prior imaging. Cardiac silhouette is mildly enlarged. Mediastinal silhouette is nonsuspicious. Pulmonary vascular congestion. RIGHT midlung zone atelectasis . Bones. Single lead LEFT cardiac defibrillator. Moderate degenerative change of bilateral shoulders. IMPRESSION: Dialysis catheter distal tip projects at brachiocephalic confluence. Mild cardiomegaly and pulmonary vascular congestion. Electronically Signed   By: Awilda Metro M.D.   On: 07/20/2016 18:51   Dg Chest Port 1 View  Result Date: 07/15/2016 CLINICAL DATA:  Shortness of Breath EXAM: PORTABLE CHEST 1 VIEW COMPARISON:  March 23, 2016 FINDINGS: There is no edema or consolidation. There is cardiomegaly with  pulmonary vascularity within normal limits. Pacemaker lead is attached to the right ventricle. There is atherosclerotic calcification in the aorta. No adenopathy. There is degenerative change in each shoulder. IMPRESSION: Cardiomegaly.  Aortic atherosclerosis.  No edema or consolidation. Electronically Signed   By: Bretta Bang III M.D.   On: 07/15/2016 14:34    Microbiology: Recent Results (from the past 240 hour(s))  Urine culture     Status: None   Collection Time: 07/15/16  4:36 PM  Result Value Ref Range Status   Specimen Description URINE, CATHETERIZED  Final   Special Requests NONE  Final   Culture NO GROWTH  Final   Report Status 07/16/2016 FINAL  Final  Culture, blood (Routine X 2) w Reflex to ID Panel     Status: Abnormal   Collection Time: 07/15/16  6:20 PM  Result Value Ref Range Status   Specimen Description BLOOD VENOUS CATHETER  Final   Special Requests BOTTLES DRAWN AEROBIC AND ANAEROBIC 5CC  Final   Culture  Setup Time   Final    AEROBIC BOTTLE ONLY GRAM NEGATIVE RODS CRITICAL RESULT CALLED TO, READ BACK BY AND VERIFIED WITH: TO JLEDGFORD(PHARD) BY TCLEVELAND 07/17/2016 AT 3:29    Culture PSEUDOMONAS AERUGINOSA (A)  Final   Report Status 07/19/2016 FINAL  Final   Organism ID, Bacteria PSEUDOMONAS AERUGINOSA  Final      Susceptibility  Pseudomonas aeruginosa - MIC*    CEFTAZIDIME 4 SENSITIVE Sensitive     CIPROFLOXACIN <=0.25 SENSITIVE Sensitive     GENTAMICIN <=1 SENSITIVE Sensitive     IMIPENEM 2 SENSITIVE Sensitive     PIP/TAZO 8 SENSITIVE Sensitive     CEFEPIME 2 SENSITIVE Sensitive     * PSEUDOMONAS AERUGINOSA  Culture, blood (Routine X 2) w Reflex to ID Panel     Status: None   Collection Time: 07/15/16  6:20 PM  Result Value Ref Range Status   Specimen Description BLOOD ARTERIAL CATHETER  Final   Special Requests BOTTLES DRAWN AEROBIC AND ANAEROBIC 5CC  Final   Culture NO GROWTH 5 DAYS  Final   Report Status 07/20/2016 FINAL  Final  Blood Culture ID  Panel (Reflexed)     Status: Abnormal   Collection Time: 07/15/16  6:20 PM  Result Value Ref Range Status   Enterococcus species NOT DETECTED NOT DETECTED Final   Vancomycin resistance NOT DETECTED NOT DETECTED Final   Listeria monocytogenes NOT DETECTED NOT DETECTED Final   Staphylococcus species NOT DETECTED NOT DETECTED Final   Staphylococcus aureus NOT DETECTED NOT DETECTED Final   Methicillin resistance NOT DETECTED NOT DETECTED Final   Streptococcus species NOT DETECTED NOT DETECTED Final   Streptococcus agalactiae NOT DETECTED NOT DETECTED Final   Streptococcus pneumoniae NOT DETECTED NOT DETECTED Final   Streptococcus pyogenes NOT DETECTED NOT DETECTED Final   Acinetobacter baumannii NOT DETECTED NOT DETECTED Final   Enterobacteriaceae species NOT DETECTED NOT DETECTED Final   Enterobacter cloacae complex NOT DETECTED NOT DETECTED Final   Escherichia coli NOT DETECTED NOT DETECTED Final   Klebsiella oxytoca NOT DETECTED NOT DETECTED Final   Klebsiella pneumoniae NOT DETECTED NOT DETECTED Final   Proteus species NOT DETECTED NOT DETECTED Final   Serratia marcescens NOT DETECTED NOT DETECTED Final   Carbapenem resistance NOT DETECTED NOT DETECTED Final   Haemophilus influenzae NOT DETECTED NOT DETECTED Final   Neisseria meningitidis NOT DETECTED NOT DETECTED Final   Pseudomonas aeruginosa DETECTED (A) NOT DETECTED Final    Comment: CRITICAL RESULT CALLED TO, READ BACK BY AND VERIFIED WITH: TO JLEDGFORD(PHARD) BY TCLEVELAND 07/17/2016 AT 3:29    Candida albicans NOT DETECTED NOT DETECTED Final   Candida glabrata NOT DETECTED NOT DETECTED Final   Candida krusei NOT DETECTED NOT DETECTED Final   Candida parapsilosis NOT DETECTED NOT DETECTED Final   Candida tropicalis NOT DETECTED NOT DETECTED Final  MRSA PCR Screening     Status: None   Collection Time: 07/16/16 12:37 AM  Result Value Ref Range Status   MRSA by PCR NEGATIVE NEGATIVE Final    Comment:        The GeneXpert MRSA  Assay (FDA approved for NASAL specimens only), is one component of a comprehensive MRSA colonization surveillance program. It is not intended to diagnose MRSA infection nor to guide or monitor treatment for MRSA infections.   Cath Tip Culture     Status: None   Collection Time: 07/18/16  9:00 AM  Result Value Ref Range Status   Specimen Description CATH TIP  Final   Special Requests NONE  Final   Culture NO GROWTH 3 DAYS  Final   Report Status 07/21/2016 FINAL  Final  Culture, blood (Routine X 2) w Reflex to ID Panel     Status: None (Preliminary result)   Collection Time: 07/18/16  1:15 PM  Result Value Ref Range Status   Specimen Description BLOOD RIGHT HAND  Final  Special Requests BOTTLES DRAWN AEROBIC ONLY 5CC  Final   Culture NO GROWTH 3 DAYS  Final   Report Status PENDING  Incomplete  Culture, blood (Routine X 2) w Reflex to ID Panel     Status: None (Preliminary result)   Collection Time: 07/18/16  1:21 PM  Result Value Ref Range Status   Specimen Description BLOOD RIGHT HAND  Final   Special Requests BOTTLES DRAWN AEROBIC AND ANAEROBIC 4CC EA  Final   Culture NO GROWTH 3 DAYS  Final   Report Status PENDING  Incomplete     Labs: Basic Metabolic Panel:  Recent Labs Lab 07/15/16 1800 07/16/16 0545 07/17/16 1400 07/19/16 0452 07/20/16 0659 07/21/16 0444 07/22/16 0718  NA 135 135 136 135 136 135 137  K 4.3 4.0 4.1 4.4 4.0 3.6 5.0  CL 97* 97* 100* 102 101 97* 100*  CO2 25 26 29 25 25 27 29   GLUCOSE 152* 230* 124* 76 54* 82 68  BUN 40* 26* 40* 41* 48* 30* 18  CREATININE 6.36* 4.62* 5.64* 5.70* 6.06* 4.49* 3.82*  CALCIUM 8.5* 8.6* 8.6* 8.4* 8.6* 8.2* 8.2*  MG 2.2  --   --   --   --   --   --   PHOS 4.7* 4.3 4.3  --   --   --   --    Liver Function Tests:  Recent Labs Lab 07/15/16 1800 07/16/16 0545 07/17/16 1400  ALBUMIN 2.0* 1.9* 1.9*   No results for input(s): LIPASE, AMYLASE in the last 168 hours. No results for input(s): AMMONIA in the last 168  hours. CBC:  Recent Labs Lab 07/15/16 1331  07/17/16 1400 07/19/16 0452 07/20/16 0659 07/21/16 0444 07/22/16 0718  WBC 13.3*  < > 9.8 8.7 9.5 12.5* 9.8  NEUTROABS 11.0*  --   --   --   --   --   --   HGB 10.2*  < > 10.1* 9.9* 10.2* 9.7* 10.1*  HCT 31.4*  < > 31.7* 31.5* 32.5* 30.8* 32.6*  MCV 78.1  < > 77.9* 77.4* 78.9 78.4 79.1  PLT 333  < > 223 168 192 125* 128*  < > = values in this interval not displayed. Cardiac Enzymes: No results for input(s): CKTOTAL, CKMB, CKMBINDEX, TROPONINI in the last 168 hours. BNP: BNP (last 3 results)  Recent Labs  03/14/16 1457 03/20/16 1227 07/15/16 1331  BNP 1,509.6* 1,489.8* 1,715.6*    ProBNP (last 3 results) No results for input(s): PROBNP in the last 8760 hours.  CBG:  Recent Labs Lab 07/20/16 2126 07/21/16 1202 07/21/16 1712 07/21/16 2159 07/22/16 1020  GLUCAP 83 120* 121* 125* 66       Signed:  Haizlee Henton  [facility] 07/22/2016, 10:43 AM

## 2016-07-22 NOTE — Progress Notes (Signed)
Pt prepared for d/c to SNF. IV d/c'd. Skin intact except as charted in most recent assessments. Vitals are stable. Report called to Abby at [facility] (receiving) facility. Pt to be transported by ambulance service.  Peri Maris, MBA, BSN, RN

## 2016-07-22 NOTE — Clinical Social Work Note (Signed)
Per MD patient ready to DC back to [facility]. RN, patient/family (notified by RN Mardelle Matte), and facility notified of patient's DC. RN given number for report. DC packet on patient's chart. Ambulance transport requested for patient by RN Mardelle Matte. CSW signing off at this time.   Roddie Mc MSW, [place], [place], [phone]

## 2016-07-22 NOTE — Progress Notes (Addendum)
[facility] Progress Note   Subjective:  "Do you have any chocolate?" Denies SOB at present. No C/Os.   Objective Vitals:   07/22/16 0900 07/22/16 0930 07/22/16 0938 07/22/16 1021  BP: (!) 105/57 (!) 102/54 106/60 108/60  Pulse: 82 77 79 77  Resp:   18 18  Temp:   98.2 F (36.8 C) 98 F (36.7 C)  TempSrc:   Oral Oral  SpO2:   100% 100%  Weight:   73.4 kg (161 lb 13.1 oz)   Height:       Physical Exam General: Pleasant, NAD  Heart: S1,S2, RRR Lungs:Bilateral breath sounds sl dec in bases CTA Abdomen: Soft, nontender, active bs Extremities: No LE edema today Dialysis Access: New R femoral HD catheter-blood lines connected-no issues. Maturing LUA AVF + bruit  Dialysis Orders:[facility] 4h 89kg  3K/ 2.25 bath  L fem cath (removed)/ LUA AVF revised 7/21 Hep 6000  Mircera 225 last given 7/29 due to missed tmts no Fe or calcitriol Recent labs: hgb 10.2 8/11 28% sat July iPTH 182 no binders P 4.4  Additional Objective Labs: Basic Metabolic Panel:  Recent Labs Lab 07/15/16 1800 07/16/16 0545 07/17/16 1400  07/20/16 0659 07/21/16 0444 07/22/16 0718  NA 135 135 136  < > 136 135 137  K 4.3 4.0 4.1  < > 4.0 3.6 5.0  CL 97* 97* 100*  < > 101 97* 100*  CO2 25 26 29   < > 25 27 29   GLUCOSE 152* 230* 124*  < > 54* 82 68  BUN 40* 26* 40*  < > 48* 30* 18  CREATININE 6.36* 4.62* 5.64*  < > 6.06* 4.49* 3.82*  CALCIUM 8.5* 8.6* 8.6*  < > 8.6* 8.2* 8.2*  PHOS 4.7* 4.3 4.3  --   --   --   --   < > = values in this interval not displayed. Liver Function Tests:  Recent Labs Lab 07/15/16 1800 07/16/16 0545 07/17/16 1400  ALBUMIN 2.0* 1.9* 1.9*  CBC:  Recent Labs Lab 07/15/16 1331  07/17/16 1400 07/19/16 0452 07/20/16 0659 07/21/16 0444 07/22/16 0718  WBC 13.3*  < > 9.8 8.7 9.5 12.5* 9.8  NEUTROABS 11.0*  --   --   --   --   --   --   HGB 10.2*  < > 10.1* 9.9* 10.2* 9.7* 10.1*  HCT 31.4*  < > 31.7* 31.5* 32.5* 30.8* 32.6*  MCV 78.1  < > 77.9* 77.4* 78.9  78.4 79.1  PLT 333  < > 223 168 192 125* 128*  < > = values in this interval not displayed. Blood Culture    Component Value Date/Time   SDES BLOOD RIGHT HAND 07/18/2016 1321   SPECREQUEST BOTTLES DRAWN AEROBIC AND ANAEROBIC 4CC EA 07/18/2016 1321   CULT NO GROWTH 3 DAYS 07/18/2016 1321   REPTSTATUS PENDING 07/18/2016 1321    Cardiac Enzymes: No results for input(s): CKTOTAL, CKMB, CKMBINDEX, TROPONINI in the last 168 hours. CBG:  Recent Labs Lab 07/21/16 1202 07/21/16 1712 07/21/16 2159 07/22/16 1020 07/22/16 1121  GLUCAP 120* 121* 125* 66 67   Iron Studies: No results for input(s): IRON, TIBC, TRANSFERRIN, FERRITIN in the last 72 hours. @lablastinr3 @ Studies/Results: Dg Chest Port 1 View  Result Date: 07/20/2016 CLINICAL DATA:  End-stage renal disease, dialysis catheter placement. EXAM: PORTABLE CHEST 1 VIEW COMPARISON:  Chest radiograph July 15, 2016 FINDINGS: Large for dialysis catheter via inferior vena cava, distal tip projecting at brachiocephalic confluence, advanced from  prior imaging. Cardiac silhouette is mildly enlarged. Mediastinal silhouette is nonsuspicious. Pulmonary vascular congestion. RIGHT midlung zone atelectasis . Bones. Single lead LEFT cardiac defibrillator. Moderate degenerative change of bilateral shoulders. IMPRESSION: Dialysis catheter distal tip projects at brachiocephalic confluence. Mild cardiomegaly and pulmonary vascular congestion. Electronically Signed   By: Awilda Metro M.D.   On: 07/20/2016 18:51   Medications:   . atorvastatin  10 mg Oral QHS  . budesonide  0.25 mg Nebulization BID  . carvedilol  3.125 mg Oral BID WC  . famotidine  20 mg Oral QHS  . feeding supplement (PRO-STAT SUGAR FREE 64)  30 mL Oral BID  . heparin  5,000 Units Subcutaneous Q8H  . insulin aspart  0-5 Units Subcutaneous QHS  . insulin aspart  0-9 Units Subcutaneous TID WC  . insulin glargine  10 Units Subcutaneous QHS  . lactose free nutrition  237 mL Oral  Q1500  . lidocaine (PF)  10 mL Intradermal Once  . mirtazapine  15 mg Oral QHS  . multivitamin with minerals  1 tablet Oral QHS  . saccharomyces boulardii  250 mg Oral BID  . sertraline  25 mg Oral Daily  . sodium chloride flush  3 mL Intravenous Q12H  . vancomycin  125 mg Oral Q6H     Assessment/Plan: 1. Abdominal pain/nausea/ wt loss-CT abd showed enlarged liver, normal GB/ panc/ bowel, some ascites/ anasarca, marked cardiomegaly.  GI seen, Cdif felt to be the main issue.  Started back on po vanc, if fails , may need fecal Tx.  2. Vol excess - resolved, dry wt down 15 kg now to 74kg 3. ESRDTTS HD 4.  Anemia- hgb 9.7 - stable - continue ESA - dosing slightly lower at Aranesp 150 given Monday x 1 off schedule 5.  Metabolic bone disease- No VDRA or binders for now P iPTH ok 6.  Nutrition- Albumin 1.9 renal diet/prostat/renal vit.  7.  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  1/2 + BC pseudomonas aeruginosa- TDC replaced 07/20/16 per Dr. Imogene Burn. ID believed this is a contaminant and stopped abx (got 3 days Maxipime and cath removal/ replacement). No further Rx needed.  9.  CM EF 35-40% - on low dose coreg, may have to hold if BP's drop w lower dry wt. Has AICD. Formerly followed by  10. Debility: Has difficulty walking/transferring  D/T Charcot foot/has issues getting into HD chair. Will contact MSW at clinic.    Disp- for DC back to SNF today after HD   Vinson Moselle MD [facility] pager [phone]    cell [phone] 07/22/2016, 11:58 AM

## 2016-07-23 LAB — CULTURE, BLOOD (ROUTINE X 2)
CULTURE: NO GROWTH
Culture: NO GROWTH

## 2016-07-24 ENCOUNTER — Other Ambulatory Visit: Payer: Self-pay

## 2016-07-24 ENCOUNTER — Non-Acute Institutional Stay (SKILLED_NURSING_FACILITY): Payer: Medicare PPO | Admitting: Internal Medicine

## 2016-07-24 DIAGNOSIS — D72829 Elevated white blood cell count, unspecified: Secondary | ICD-10-CM | POA: Diagnosis not present

## 2016-07-24 DIAGNOSIS — Z794 Long term (current) use of insulin: Secondary | ICD-10-CM

## 2016-07-24 DIAGNOSIS — J449 Chronic obstructive pulmonary disease, unspecified: Secondary | ICD-10-CM

## 2016-07-24 DIAGNOSIS — E1122 Type 2 diabetes mellitus with diabetic chronic kidney disease: Secondary | ICD-10-CM

## 2016-07-24 DIAGNOSIS — A047 Enterocolitis due to Clostridium difficile: Secondary | ICD-10-CM

## 2016-07-24 DIAGNOSIS — R748 Abnormal levels of other serum enzymes: Secondary | ICD-10-CM

## 2016-07-24 DIAGNOSIS — A0472 Enterocolitis due to Clostridium difficile, not specified as recurrent: Secondary | ICD-10-CM

## 2016-07-24 DIAGNOSIS — N186 End stage renal disease: Secondary | ICD-10-CM

## 2016-07-24 DIAGNOSIS — D638 Anemia in other chronic diseases classified elsewhere: Secondary | ICD-10-CM | POA: Diagnosis not present

## 2016-07-24 DIAGNOSIS — I5042 Chronic combined systolic (congestive) and diastolic (congestive) heart failure: Secondary | ICD-10-CM

## 2016-07-24 DIAGNOSIS — R7881 Bacteremia: Secondary | ICD-10-CM | POA: Diagnosis not present

## 2016-07-24 DIAGNOSIS — E46 Unspecified protein-calorie malnutrition: Secondary | ICD-10-CM | POA: Diagnosis not present

## 2016-07-24 DIAGNOSIS — K429 Umbilical hernia without obstruction or gangrene: Secondary | ICD-10-CM

## 2016-07-24 DIAGNOSIS — Z992 Dependence on renal dialysis: Secondary | ICD-10-CM

## 2016-07-24 DIAGNOSIS — E785 Hyperlipidemia, unspecified: Secondary | ICD-10-CM

## 2016-07-24 DIAGNOSIS — R531 Weakness: Secondary | ICD-10-CM

## 2016-07-24 DIAGNOSIS — F419 Anxiety disorder, unspecified: Secondary | ICD-10-CM

## 2016-07-24 DIAGNOSIS — K219 Gastro-esophageal reflux disease without esophagitis: Secondary | ICD-10-CM

## 2016-07-24 NOTE — Progress Notes (Signed)
LOCATION: Facility[facility]    PCP: Oneal GroutPANDEY, Dreanna Kyllo, MD   Code Status: full code   Goals of care: Advanced Directive information Advanced Directives 07/15/2016  Does patient have an advance directive? No  Type of Advance Directive -  Does patient want to make changes to advanced directive? -  Copy of advanced directive(s) in chart? -  Would patient like information on creating an advanced directive? No - patient declined information       Extended Emergency Contact Information Primary Emergency Contact: Haith,[name] Address: [address]          [place], [place] [address] [place] Home Phone: [phone] Relation: Sister Secondary Emergency Contact: Marcello MooresBell,Laurene          [place] Home Phone: [phone] Relation: Sister   Allergies  Allergen Reactions  . Entresto [Sacubitril-Valsartan] Other (See Comments)     unknown reaction  . Sulfa Antibiotics Itching    Chief Complaint  Patient presents with  . Readmit To SNF     HPI:  Patient is a 65 y.o. female seen today for long term care post hospital re- admission from 07/15/16-07/22/16 with dyspnea and pseudomonas bacteremia. She required dialysis for volume overload. She was started on antibiotics and ID was consulted. This was thought to be contaminant growth with bacteria growing in only 1 bottle of the blood culture and with repeat culture showing no growth of bacteria. She had right tunneled dialysis catheter placed by vascular surgery this admission. She was continued on po vancomycin for her c.diff colitis.  She was also seen by GI team for her right upper quadrant abdominal pain thought to be from umbilical hernia and biliary sludge noted on her previous ultrasound done at [facility]. She has PMH of ESRD on HD, c.diff colitis, umbilical hernia, DM, AICD, OSA among others. She is seen in her room today.    Review of Systems:    Constitutional: Negative for fever, chills, diaphoresis. positive for easy fatigue.  HENT: Negative for headache, congestion, nasal discharge. Eyes: Negative for blurred vision, double vision and discharge.  Respiratory: Negative for cough, shortness of breath and wheezing.   Cardiovascular: Negative for chest pain, palpitations, leg swelling.  Gastrointestinal: Negative for heartburn, nausea, vomiting. Had a bowel movement yesterday that was semi formed. Denies blood in stool. Positive for abdominal pain. Genitourinary: Negative for dysuria and flank pain.  Musculoskeletal: Negative for fall.  Skin: Negative for itching, rash.  Neurological: Negative for dizziness. Psychiatric/Behavioral: Negative for depression   Past Medical History:  Diagnosis Date  . AICD (automatic cardioverter/defibrillator) present   . Anemia   . C. difficile colitis   . CHF (congestive heart failure) (HCC)    systolic  . COPD (chronic obstructive pulmonary disease) (HCC)   . Coronary artery disease   . Diabetes mellitus (HCC)   . DVT (deep venous thrombosis) (HCC)    on coumadin  . ESRD (end stage renal disease) (HCC)    tues/thurs/sat dialysis  . Hypertension   . Obstructive sleep apnea    no cpap  . Renal disorder    Past Surgical History:  Procedure Laterality Date  . AV FISTULA PLACEMENT Left 04/03/2016   Procedure: ARTERIOVENOUS (AV) FISTULA CREATION;  Surgeon: Larina Earthlyodd F Early, MD;  [facility];  Service: Vascular;  Laterality: Left;  . FISTULA SUPERFICIALIZATION Left 06/16/2016   Procedure: SUPERFICIALIZATION OF LEFT ARM BRACHIOCEPHALIC ARTERIOVENOUS FISTULA;  Surgeon: Chuck Hinthristopher S Dickson,  MD;  Location: [facility] OR;  Service: Vascular;  Laterality: Left;  . INSERTION OF DIALYSIS CATHETER Left 04/03/2016   Procedure: INSERTION OF DIALYSIS CATHETER;  Surgeon: Larina Earthly, MD;  Location: [facility] OR;  Service: Vascular;  Laterality: Left;  . LIGATION OF COMPETING BRANCHES OF ARTERIOVENOUS FISTULA Left  06/16/2016   Procedure: LIGATION OF COMPETING BRANCHES OF left brachiocephalic ARTERIOVENOUS FISTULA;  Surgeon: Chuck Hint, MD;  Location: [facility] OR;  Service: Vascular;  Laterality: Left;  . PERIPHERAL VASCULAR CATHETERIZATION N/A 07/20/2016   Procedure: A/V Shuntogram/Fistulagram;  Surgeon: Fransisco Hertz, MD;  Location: [facility] INVASIVE CV LAB;  Service: Cardiovascular;  Laterality: N/A;  . PERIPHERAL VASCULAR CATHETERIZATION N/A 07/20/2016   Procedure: Dialysis/Perma Catheter Insertion;  Surgeon: Fransisco Hertz, MD;  Location: [facility] INVASIVE CV LAB;  Service: Cardiovascular;  Laterality: N/A;  . REVISION OF ARTERIOVENOUS GORETEX GRAFT Left 06/16/2016   Procedure: REVISION OF LEFT BRACHIOCEPHALIC ARTERIOVENOUS FISTULA WITH RESECTION OF REDUNDANT SECTION;  Surgeon: Chuck Hint, MD;  Location: [facility] OR;  Service: Vascular;  Laterality: Left;   Social History:   reports that she quit smoking about 10 years ago. Her smoking use included Cigarettes. She has never used smokeless tobacco. She reports that she does not drink alcohol or use drugs.  No family history on file.  Medications:   Medication List       Accurate as of 07/24/16 11:26 AM. Always use your most recent med list.          acetaminophen 325 MG tablet Commonly known as:  TYLENOL Take 325 mg by mouth every 6 (six) hours as needed for mild pain or fever (For fever >99.5).   atorvastatin 10 MG tablet Commonly known as:  LIPITOR Take 10 mg by mouth at bedtime.   carvedilol 3.125 MG tablet Commonly known as:  COREG Take 3.125 mg by mouth 2 (two) times daily with a meal. Hold for SBP <119 or HR <60   CERTAVITE/ANTIOXIDANTS Tabs Take 1 tablet by mouth at bedtime.   famotidine 20 MG tablet Commonly known as:  PEPCID Take 1 tablet (20 mg total) by mouth at bedtime.   feeding supplement (PRO-STAT SUGAR FREE 64) Liqd Take 30 mLs by mouth daily.   FIBER CHOICE PO Take 2 tablets by mouth daily as needed (constipation).     hydrOXYzine 50 MG tablet Commonly known as:  ATARAX/VISTARIL Take 50 mg by mouth every 6 (six) hours as needed for itching.   insulin glargine 100 unit/mL Sopn Commonly known as:  LANTUS Inject 0.1 mLs (10 Units total) into the skin at bedtime.   insulin lispro 100 UNIT/ML injection Commonly known as:  HUMALOG Inject 0-10 Units into the skin 3 (three) times daily with meals. CBG 0-59 Hypoglycemic Protocol, 60-149 0 units 150-250 5 units, 251-300 8 units, 301-350 10 units, >350 call MD   lactose free nutrition Liqd Take 237 mLs by mouth daily at 3 pm.   LORazepam 0.5 MG tablet Commonly known as:  ATIVAN Take 1 tablet (0.5 mg total) by mouth 2 (two) times daily as needed for anxiety.   mirtazapine 15 MG tablet Commonly known as:  REMERON Take 1 tablet (15 mg total) by mouth at bedtime.   oxyCODONE-acetaminophen 5-325 MG tablet Commonly known as:  ROXICET Take 1-2 tablets by mouth every 4 (four) hours as needed.   OXYGEN Inhale 2 L into the lungs as needed.   PROAIR HFA 108 (90 Base) MCG/ACT inhaler Generic drug:  albuterol Inhale 2 puffs  into the lungs every 6 (six) hours as needed for wheezing or shortness of breath.   PULMICORT FLEXHALER 90 MCG/ACT inhaler Generic drug:  Budesonide Inhale 2 puffs into the lungs 2 (two) times daily.   saccharomyces boulardii 250 MG capsule Commonly known as:  FLORASTOR Take 250 mg by mouth 2 (two) times daily. For recurrent C. Diff.  Take 1 tablet (250 mg) by mouth twice daily until 2 weeks after completion of antibiotics (stop date 08/05/16   sertraline 25 MG tablet Commonly known as:  ZOLOFT Take 25 mg by mouth at bedtime.   vancomycin 50 mg/mL oral solution Commonly known as:  VANCOCIN Take 2.5 mLs (125 mg total) by mouth every 6 (six) hours. Take for one additional week       Immunizations: Immunization History  Administered Date(s) Administered  . PPD Test 03/01/2016, 04/10/2016     Physical Exam: Vitals:   07/24/16  1124  BP: 92/62  Pulse: 79  Resp: 18  Temp: 98 F (36.7 C)  SpO2: 98%  Weight: 166 lb (75.3 kg)  Height: 5\' 8"  (1.727 m)   Body mass index is 25.24 kg/m.  General- elderly female, well built, chronically ill appearing Head- normocephalic, atraumatic Nose- no maxillary or frontal sinus tenderness, no nasal discharge Throat- moist mucus membrane  Eyes- PERRLA, EOMI, no pallor, no icterus, no discharge, normal conjunctiva, normal sclera Neck- no cervical lymphadenopathy Cardiovascular- normal s1,s2, no murmur, no leg edema Respiratory- bilateral clear to auscultation, no wheeze, no rhonchi, no crackles, no use of accessory muscles Abdomen- bowel sounds present, soft, mild diffuse tenderness on palpation, umbilical hernia with some tenderness, + ascites Musculoskeletal- able to move all 4 extremities, generalized weakness Neurological- alert and oriented to person, place and time Skin- warm and dry, right thigh has tunneled dialysis catheter Psychiatry- normal mood and affect    Labs reviewed: Basic Metabolic Panel:  Recent Labs  16/10/96 1423  05/08/16 0436  07/15/16 1800 07/16/16 0545 07/17/16 1400  07/21/16 0444 07/22/16 0718 07/22/16 1109  NA  --   < > 137  < > 135 135 136  < > 135 137 135  K  --   < > 3.5  < > 4.3 4.0 4.1  < > 3.6 5.0 3.5  CL  --   < > 103  < > 97* 97* 100*  < > 97* 100* 102  CO2  --   < > 25  < > 25 26 29   < > 27 29 24   GLUCOSE  --   < > 76  < > 152* 230* 124*  < > 82 68 64*  BUN  --   < > 28*  < > 40* 26* 40*  < > 30* 18 12  CREATININE  --   < > 6.52*  < > 6.36* 4.62* 5.64*  < > 4.49* 3.82* 2.74*  CALCIUM  --   < > 8.0*  < > 8.5* 8.6* 8.6*  < > 8.2* 8.2* 8.2*  MG 2.1  --  1.9  --  2.2  --   --   --   --   --   --   PHOS  --   < >  --   < > 4.7* 4.3 4.3  --   --   --  1.7*  < > = values in this interval not displayed. Liver Function Tests:  Recent Labs  03/04/16 0000  03/21/16 0454  05/06/16 1240  05/16/16 05/23/16 06/01/16  07/16/16 0545  07/17/16 1400 07/22/16 1109  AST 43*  < > 22  < > 18  --  18 25 15   --   --   --   --   ALT 15  < > 11*  < > 11*  --  12 13 8   --   --   --   --   ALKPHOS 207*  < > 110  < > 180*  --  196* 222* 234*  --   --   --   --   BILITOT 2.3*  --  1.5*  --  1.5*  --   --   --   --   --   --   --   --   PROT 6.9  --  5.7*  --  7.3  --   --   --   --   --   --   --   --   ALBUMIN 2.9*  --  2.1*  < > 2.4*  < >  --   --   --   < > 1.9* 1.9* 2.0*  < > = values in this interval not displayed.  Recent Labs  03/04/16 0000 05/06/16 1240  LIPASE 44 37   No results for input(s): AMMONIA in the last 8760 hours. CBC:  Recent Labs  03/20/16 1227  05/06/16 1240  07/15/16 1331  07/21/16 0444 07/22/16 0718 07/22/16 1109  WBC 7.5  < > 5.3  < > 13.3*  < > 12.5* 9.8 11.5*  NEUTROABS 5.7  --  3.3  --  11.0*  --   --   --   --   HGB 9.0*  < > 9.3*  < > 10.2*  < > 9.7* 10.1* 10.4*  HCT 28.4*  < > 29.8*  < > 31.4*  < > 30.8* 32.6* 32.8*  MCV 85.5  < > 85.4  < > 78.1  < > 78.4 79.1 79.0  PLT 165  < > 268  < > 333  < > 125* 128* 90*  < > = values in this interval not displayed. Cardiac Enzymes: No results for input(s): CKTOTAL, CKMB, CKMBINDEX, TROPONINI in the last 8760 hours. BNP: Invalid input(s): POCBNP CBG:  Recent Labs  07/22/16 1020 07/22/16 1121 07/22/16 1203  GLUCAP 66 67 90    Radiological Exams: Ct Abdomen Pelvis Wo Contrast  Result Date: 07/16/2016 CLINICAL DATA:  Right upper quadrant pain and nausea EXAM: CT ABDOMEN AND PELVIS WITHOUT CONTRAST TECHNIQUE: Multidetector CT imaging of the abdomen and pelvis was performed following the standard protocol without IV contrast. COMPARISON:  CT abdomen/ pelvis 07/06/2016 FINDINGS: The examination is degraded by motion and the lack of intravenous contrast. Lower chest: There is massive cardiomegaly. Moderate right and small left pleural effusions. No pulmonary nodules are identified. There is a calcified granuloma in the left lung base. Bilateral  basilar atelectasis. Left femoral approach dialysis catheter terminates beyond the field of view. Hepatobiliary: The liver is enlarged with small volume perihepatic ascites. No liver lesions identified on this noncontrast study. Gallbladder appears normal. Pancreas: Normal unenhanced appearance of the pancreas. No pancreatic ductal dilatation. No peripancreatic fluid collection. Spleen: Normal spleen. Adrenals/Urinary Tract: Adrenal glands are normal. No hydronephrosis. No nephrolithiasis. Renal contours are normal. Urinary bladder is incompletely distended. Stomach/Bowel: No dilated small bowel or other evidence of obstruction. No enteric or colonic inflammation. The appendix is not clearly visualized. The stomach is moderately distended. Fat-containing ventral abdominal hernia. Vascular/Lymphatic: There  is atherosclerotic calcification within the abdominal aorta and its branch vessels. There is a left femoral vein approach central venous catheter that terminates beyond the field of view. Reproductive: There is fluid within the endometrial cavity. The ovaries are unremarkable. Other: Diffuse anasarca. Musculoskeletal: Multilevel facet arthrosis and osteophytosis. IMPRESSION: 1. No acute intra-abdominal process. No evidence of pseudomembranous colitis. 2. Hepatomegaly small volume perihepatic ascites. 3. Massive cardiomegaly with bilateral pleural effusions, right-greater-than-left, which worsened prior study. 4. Aortic atherosclerosis. Electronically Signed   By: Deatra Robinson M.D.   On: 07/16/2016 04:16   Dg Chest Port 1 View  Result Date: 07/20/2016 CLINICAL DATA:  End-stage renal disease, dialysis catheter placement. EXAM: PORTABLE CHEST 1 VIEW COMPARISON:  Chest radiograph July 15, 2016 FINDINGS: Large for dialysis catheter via inferior vena cava, distal tip projecting at brachiocephalic confluence, advanced from prior imaging. Cardiac silhouette is mildly enlarged. Mediastinal silhouette is  nonsuspicious. Pulmonary vascular congestion. RIGHT midlung zone atelectasis . Bones. Single lead LEFT cardiac defibrillator. Moderate degenerative change of bilateral shoulders. IMPRESSION: Dialysis catheter distal tip projects at brachiocephalic confluence. Mild cardiomegaly and pulmonary vascular congestion. Electronically Signed   By: Awilda Metro M.D.   On: 07/20/2016 18:51   Dg Chest Port 1 View  Result Date: 07/15/2016 CLINICAL DATA:  Shortness of Breath EXAM: PORTABLE CHEST 1 VIEW COMPARISON:  March 23, 2016 FINDINGS: There is no edema or consolidation. There is cardiomegaly with pulmonary vascularity within normal limits. Pacemaker lead is attached to the right ventricle. There is atherosclerotic calcification in the aorta. No adenopathy. There is degenerative change in each shoulder. IMPRESSION: Cardiomegaly.  Aortic atherosclerosis.  No edema or consolidation. Electronically Signed   By: Bretta Bang III M.D.   On: 07/15/2016 14:34    Assessment/Plan  Generalized weakness Will have her work with physical therapy and occupational therapy team to help with gait training and muscle strengthening exercises.fall precautions. Skin care. Encourage to be out of bed.   Bacteremia Reviewed repeat blood culture with no bacterial growth. Monitor clinically for now  C.diff colitis Currently on vancomycin 2.5 ml/ 125 mg q6h x 1 week. Reviewed ID and GI notes. Per GI note, recommends slow taper after completion of 1 week course and GI follow up. Fecal transplant is recommended if patient has another c.diff colitis episode. Thus will have her on vancomycin 125 mg qid x 1 week, then 125 mg tid x 1 week, 125 mg bid x 1 week and 125 mg daily x 1 week. Maintain hydration and check bmp  Umbilical hernia Pain present. Will get general surgery referral for evaluation for surgery. Monitor clinically. Currently on roxicet 5-325 mg 1-2 tab q4h prn pain.  Elevated ALP Her biliary sludge could be  contributing some. Monitor LFT  Leukocytosis Afebrile. Currently being treated for c.diff colitis. Monitor wbc and temp curve  Anemia of chronic disease Monitor cbc  ESRD Continue her hemodialysis and monitor  gerd Stable, continue famotidine 20 mg daily  Protein calorie malnutrition Continue boost supplement and feeding supplement, monitor weight. RD to follow  Anxiety Stable, continue ativan 0.5 mg bid prn anxiety  CHF Monitor fluid intake and clinical symptom. On coreg 3.125 mg bid at present. With her soft BP reading, d/c coreg. Start metoprolol succinate 12.5 mg daily and monitor BP  HLD Continue atorvastatin 10 mg daily  COPD Breathing is stable. Continue breathing treatment  DM Lab Results  Component Value Date   HGBA1C 6.4 03/17/2016   Monitor cbg. Check a1c. Continue lantus 10 u  daily and SSI humalog    Goals of care: long term care   Labs/tests ordered: cbc, cmp, a1c   Family/ staff Communication: reviewed care plan with patient and nursing supervisor    [name], MD Internal Medicine [facility] [address] [place], [place] [address] Cell Phone (Monday-Friday 8 am - 5 pm): [phone] On Call: [phone] and follow prompts after 5 pm and on weekends Office Phone: [phone] Office Fax: [phone]

## 2016-07-26 ENCOUNTER — Encounter (HOSPITAL_COMMUNITY): Payer: Self-pay | Admitting: Vascular Surgery

## 2016-07-26 ENCOUNTER — Encounter: Payer: Medicare PPO | Admitting: Vascular Surgery

## 2016-07-26 ENCOUNTER — Encounter (HOSPITAL_COMMUNITY): Payer: Self-pay | Admitting: *Deleted

## 2016-07-26 ENCOUNTER — Encounter (HOSPITAL_COMMUNITY): Payer: Medicare PPO

## 2016-07-26 LAB — BASIC METABOLIC PANEL
BUN: 29 mg/dL — AB (ref 4–21)
CREATININE: 4.2 mg/dL — AB (ref 0.5–1.1)
Glucose: 105 mg/dL
Potassium: 4 mmol/L (ref 3.4–5.3)
SODIUM: 140 mmol/L (ref 137–147)

## 2016-07-26 LAB — HEPATIC FUNCTION PANEL
ALK PHOS: 179 U/L — AB (ref 25–125)
ALT: 12 U/L (ref 7–35)
AST: 17 U/L (ref 13–35)
Bilirubin, Total: 1.1 mg/dL

## 2016-07-26 LAB — CBC AND DIFFERENTIAL
HCT: 32 % — AB (ref 36–46)
Hemoglobin: 10.1 g/dL — AB (ref 12.0–16.0)
Platelets: 172 10*3/uL (ref 150–399)
WBC: 9.7 10^3/mL

## 2016-07-26 LAB — HEMOGLOBIN A1C: Hemoglobin A1C: 5.9

## 2016-07-26 NOTE — Progress Notes (Signed)
Pt SDW-pre-op call completed by pt nurse Roe Coombs, LPN at [facility]. Nurse denies that pt C/O SOB and chest pain. Nurse stated that pt is under the care of Dr. Gifford Shave, Cardiology, who also takes care of her ICD; peri-op prescription for ICD faxed to MD. Nurse to fax current [facility]. Please complete anesthesia assessment on DOS. Nurse made aware to administer half of HS Lantus Insulin tonight ( 5 units instead of 10). Nurse made aware of diabetes protocol to check pt BS every 2 hours prior to arrival, interventions for a BS <70 and >220 ( half of correction dose) and phone # to SS. Nurse made aware to stop vitamins, fish oil, herbal medications and NSAID's. Nurse verbalized understanding of all pre-op instructions.

## 2016-07-26 NOTE — Progress Notes (Addendum)
Anesthesia Chart Review: SAME DAY WORK-UP.  Patient is a 65 year old female scheduled for RUE AVF versus AVGG on 07/27/16 by Dr. Randie Heinz.  History includes ESRD (s/p left groin dialysis catheter and left brachiocephalic AVF creation 04/03/16 s/p revision 06/16/16; s/p left cephalic venoplasty and insertion of right femoral vein dialysis catheter 07/20/16), C. Difficile colitis 02/2016 and 05/06/16, CAD/MI s/p PCI (details unknown), ischemic cardiomyopathy, s/p Biotronik ICD 04/12/15, chronic systolic CHF, DM2, OSA (No CPAP), COPD (home O2 PRN), anemia, anxiety, right IJ DVT 01/2016 (at site of previous HD catheter), right breast abscess s/p I&D 02/22/16 ([facility]). - Admission 07/15/16-07/22/16 for pseudomonas bacteremia (1 or 2 positive blood cultures from left femoral catheter; negative 07/18/16 blood culture). ID and vascular surgery consulted. Left femoral catheter removed and right femoral catheter placed 07/20/16. She was discharged to [facility], last visit evaluation on 07/24/16 by Dr. Glade Lloyd. - Admission 05/06/16-05/12/16 with C. Difficile colitis following clindamycin for breast abscess. Started on vancomycin.  - Admission 03/20/16-04/10/16 (from [facility]) for acute on chronic CHF,  acute on chronic renal failure, and C. Difficile colitis. Seen by HF cardiologist Dr. Shirlee Latch for systolic CHF (new patient). His notes mention patient had prior 02/09/16-03/01/16 admission to [facility] in [place] also for acute on chronic systolic CHF and AKI requiring temporary dialysis (discharge summary scanned under Media tab). EF 22% at that time (saw cardiologist Dr. Barth Kirks). She had known CAD/ischemic CM with ICD, but otherwise specific details known. Renal and vascular surgery also consulted. She required hemodialysis access and re-initiation of hemodialysis. She had right IJ DVT diagnosed 01/2016 and was on warfarin. Had been on ASA/Plavix for CAD, but held due to hematuria, thrombocytopenia and  warfarin therapy. (It appears warfarin was discontinued during 04/2016 admission since she had completed 3 months of anticoagulation theapay for right IJ DVT.)   EP cardiologist is Dr. Roger Kill in [place] [phone]). Records requested, but are pending. Primary GI (in [place]) is Dr. Dulce Sellar.  Meds include albuterol, Lipitor, Pulmicort, Coreg, Pepcid, Lantus, Humalog, Ativan, Remeron, oxygen, Zoloft, vancomycin.  07/15/16 EKG: SR, low voltage, extremity, borderline prolonged QT interval.   Based on records scanned under Media tab, it looks like her Biotronik ICD was last interrogated on 06/16/16, prior to her left AVF revision.   03/21/16 Echo: Study Conclusions - Left ventricle: The cavity size was mildly dilated. Wall   thickness was normal. Systolic function was moderately reduced.   The estimated ejection fraction was in the range of 35% to 40%.   Diffuse hypokinesis. There is akinesis of the inferolateral and   inferior myocardium. Features are consistent with a pseudonormal   left ventricular filling pattern, with concomitant abnormal   relaxation and increased filling pressure (grade 2 diastolic   dysfunction). - Ascending aorta: The ascending aorta was mildly dilated. - Mitral valve: Calcified annulus. Mildly thickened leaflets .   There was mild regurgitation. - Left atrium: The atrium was mildly dilated. - Right ventricle: The cavity size was moderately dilated. - Right atrium: The atrium was moderately dilated. - Atrial septum: The septum bowed from right to left, consistent   with increased right atrial pressure. - Tricuspid valve: There was severe regurgitation. - Pulmonary arteries: PA peak pressure: 38 mm Hg (S). - Pericardium, extracardiac: A small pericardial effusion was   identified. Impressions: - Global hypokinesis with inferior and inferior lateral akinesis;   overall moderately reduced LV function; grade 2 diastolic   dysfunction; mild LAE; moderate  RAE/RVE; small pericardial  effusion; mild MR; severe TR.  07/20/16 1V CXR: IMPRESSION: Dialysis catheter distal tip projects at brachiocephalic confluence. Presence of single lead LEFT cardiac defibrillator. Mild cardiomegaly and pulmonary vascular congestion.  She is for labs on arrival.   Patient with significant cardiac history including ischemic CM, CHF, ICD. Complete details regarding CAD/PCI are unknown, but ICD was placed in 2016. She had a recent echo showing EF 35-40%. She was started on long-term HD following several admission for acute on chronic CHF/volume overload. She underwent two hemodialysis access procedures within the last 3-4 months. Discussed with anesthesiologist Dr. Okey Dupreose. Further evaluation on the day of surgery to ensure labs are acceptable and no acute CV/CHF symptoms prior to proceeding. She has a Biotronik ICD.   Velna Ochsllison Deirdra Heumann, PA-C [facility]Anesthesiology Phone [phone] 07/26/2016 4:12 PM  Addendum: 02/02/16 office note received from Darlyn ReadAngela Whitlow, NP with [facility]. Notes states that patient has mixed ischemic and non-ischemic cardiomyopathy, NYHA Class III. History of PCI of the LCX in 2010. She has a Biotronik single lead ICD and a history of atrial tachycardia. Cardiac cath 03/2014 showed: "minor luminal irregularities in the LAD. Patent stent in the first marginal branch of the circumflex. RCA with mild luminal irregularities." 03/2014 RHC: "RA 11, PA pressure 58/22 with a mean of 39, a mean wedge pressure of 19, LVEDP of 27. PVR of 3.9. Cardiac output 5.1. Cardiac index 2.5."  Velna Ochsllison Jakel Alphin, PA-C [facility] Phone [phone] 07/27/2016 9:41 AM

## 2016-07-27 ENCOUNTER — Encounter (HOSPITAL_COMMUNITY): Payer: Self-pay | Admitting: *Deleted

## 2016-07-27 ENCOUNTER — Encounter (HOSPITAL_COMMUNITY)
Admission: RE | Disposition: A | Discharge status: Home / Self Care | Payer: Self-pay | Source: Ambulatory Visit | Attending: Vascular Surgery

## 2016-07-27 ENCOUNTER — Ambulatory Visit (HOSPITAL_COMMUNITY)
Admission: RE | Admit: 2016-07-27 | Discharge: 2016-07-27 | Disposition: A | Discharge status: Home / Self Care | Payer: Medicare PPO | Source: Ambulatory Visit | Attending: Vascular Surgery | Admitting: Vascular Surgery

## 2016-07-27 DIAGNOSIS — I255 Ischemic cardiomyopathy: Secondary | ICD-10-CM | POA: Diagnosis not present

## 2016-07-27 DIAGNOSIS — I5022 Chronic systolic (congestive) heart failure: Secondary | ICD-10-CM | POA: Diagnosis not present

## 2016-07-27 DIAGNOSIS — I2510 Atherosclerotic heart disease of native coronary artery without angina pectoris: Secondary | ICD-10-CM | POA: Insufficient documentation

## 2016-07-27 DIAGNOSIS — N186 End stage renal disease: Secondary | ICD-10-CM | POA: Insufficient documentation

## 2016-07-27 DIAGNOSIS — Z992 Dependence on renal dialysis: Secondary | ICD-10-CM | POA: Insufficient documentation

## 2016-07-27 DIAGNOSIS — Z95810 Presence of automatic (implantable) cardiac defibrillator: Secondary | ICD-10-CM | POA: Diagnosis not present

## 2016-07-27 DIAGNOSIS — I132 Hypertensive heart and chronic kidney disease with heart failure and with stage 5 chronic kidney disease, or end stage renal disease: Secondary | ICD-10-CM | POA: Diagnosis not present

## 2016-07-27 DIAGNOSIS — Z538 Procedure and treatment not carried out for other reasons: Secondary | ICD-10-CM | POA: Diagnosis not present

## 2016-07-27 DIAGNOSIS — I252 Old myocardial infarction: Secondary | ICD-10-CM | POA: Diagnosis not present

## 2016-07-27 HISTORY — DX: Cardiomyopathy, unspecified: I42.9

## 2016-07-27 HISTORY — DX: Presence of spectacles and contact lenses: Z97.3

## 2016-07-27 HISTORY — DX: Anxiety disorder, unspecified: F41.9

## 2016-07-27 LAB — POCT I-STAT 4, (NA,K, GLUC, HGB,HCT)
GLUCOSE: 109 mg/dL — AB (ref 65–99)
HEMATOCRIT: 39 % (ref 36.0–46.0)
HEMOGLOBIN: 13.3 g/dL (ref 12.0–15.0)
Potassium: 4.5 mmol/L (ref 3.5–5.1)
Sodium: 141 mmol/L (ref 135–145)

## 2016-07-27 LAB — GLUCOSE, CAPILLARY
GLUCOSE-CAPILLARY: 109 mg/dL — AB (ref 65–99)
GLUCOSE-CAPILLARY: 115 mg/dL — AB (ref 65–99)

## 2016-07-27 SURGERY — ARTERIOVENOUS (AV) FISTULA CREATION
Anesthesia: Monitor Anesthesia Care | Laterality: Right

## 2016-07-27 MED ORDER — DEXTROSE 5 % IV SOLN
1.5000 g | INTRAVENOUS | Status: DC
  Filled 2016-07-27: qty 1.5

## 2016-07-27 MED ORDER — SODIUM CHLORIDE 0.9 % IV SOLN: INTRAVENOUS | Status: DC

## 2016-07-27 MED ORDER — FENTANYL CITRATE (PF) 100 MCG/2ML IJ SOLN
INTRAMUSCULAR | Status: AC
  Filled 2016-07-27: qty 2

## 2016-07-27 MED ORDER — METOPROLOL TARTRATE 12.5 MG HALF TABLET
ORAL_TABLET | ORAL | Status: AC
Start: 2016-07-27 — End: 2016-07-27
  Filled 2016-07-27: qty 1

## 2016-07-27 MED ORDER — METOPROLOL TARTRATE 12.5 MG HALF TABLET
12.5000 mg | ORAL_TABLET | Freq: Once | ORAL | Status: AC
  Administered 2016-07-27: 12.5 mg via ORAL
  Filled 2016-07-27: qty 1

## 2016-07-27 MED ORDER — MIDAZOLAM HCL 2 MG/2ML IJ SOLN
INTRAMUSCULAR | Status: AC
  Filled 2016-07-27: qty 2

## 2016-07-27 MED ORDER — PROPOFOL 10 MG/ML IV BOLUS
INTRAVENOUS | Status: AC
  Filled 2016-07-27: qty 20

## 2016-07-27 MED ORDER — CHLORHEXIDINE GLUCONATE CLOTH 2 % EX PADS: 6.0000 | MEDICATED_PAD | Freq: Once | CUTANEOUS | Status: DC

## 2016-07-27 NOTE — Progress Notes (Signed)
   Lauralyn Heart has requested to not have surgery today. I attempted to call her sister [name]) to discuss but was unable to reach her. Ms. Eichel states she will discuss with her family and have surgery in a week or two. She can continue to dialyze via her R femoral tdc and office will contact her to reschedule.   Karina Nofsinger C. Randie Heinz, MD [facility] [place] Office: [phone] Pager: [phone]

## 2016-07-27 NOTE — Progress Notes (Signed)
Pt refused to let me start IV in left arm,states it has to go on right. Refused to sign consent until she speaks with Dr.Cain bc she understood surgery to be on left arm.Called OR that Dr.Cain is in to let him know. Awaiting return call

## 2016-08-09 ENCOUNTER — Encounter: Payer: Medicare PPO | Admitting: Vascular Surgery

## 2016-08-11 ENCOUNTER — Other Ambulatory Visit: Payer: Self-pay

## 2016-08-17 ENCOUNTER — Encounter (HOSPITAL_COMMUNITY): Payer: Self-pay | Admitting: *Deleted

## 2016-08-17 ENCOUNTER — Other Ambulatory Visit (HOSPITAL_COMMUNITY): Payer: Self-pay | Admitting: *Deleted

## 2016-08-17 NOTE — Progress Notes (Signed)
Called [employer] and spoke with Olegario Messier, gave instructions about medications and verified history  Patient has an ICD, paperwork faxed, will send to anesthesia for review

## 2016-08-18 NOTE — Progress Notes (Signed)
Anesthesia follow-up: See my 07/26/16 note with 07/26/16 addendum. She initially came in for RUE AVF versus AVGG on 07/27/16; however patient refused to have surgery that day. She at at first thought they were operating on the other arm and then she wanted to think about the procedure a few more weeks before deciding whether or not to proceed. No surgery has been rescheduled for 08/21/16. Further evaluation on the day of surgery. She has a Biotronik single lead ICD. Patent OM1 stent with otherwise minor luminal irregularities by 2015 cath.  Brandy Dudley [facility] Short Stay Center/Anesthesiology Phone [phone] 08/18/2016 11:36 AM

## 2016-08-21 ENCOUNTER — Encounter (HOSPITAL_COMMUNITY): Payer: Self-pay | Admitting: *Deleted

## 2016-08-21 ENCOUNTER — Ambulatory Visit (HOSPITAL_COMMUNITY): Payer: Medicare PPO | Admitting: Vascular Surgery

## 2016-08-21 ENCOUNTER — Encounter (HOSPITAL_COMMUNITY)
Admission: RE | Disposition: A | Discharge status: Skilled Nursing Facility | Payer: Self-pay | Source: Ambulatory Visit | Attending: Vascular Surgery

## 2016-08-21 ENCOUNTER — Telehealth: Payer: Self-pay | Admitting: *Deleted

## 2016-08-21 ENCOUNTER — Ambulatory Visit (HOSPITAL_COMMUNITY)
Admission: RE | Admit: 2016-08-21 | Discharge: 2016-08-21 | Disposition: A | Discharge status: Skilled Nursing Facility | Payer: Medicare PPO | Source: Ambulatory Visit | Attending: Vascular Surgery | Admitting: Vascular Surgery

## 2016-08-21 DIAGNOSIS — I5022 Chronic systolic (congestive) heart failure: Secondary | ICD-10-CM | POA: Diagnosis not present

## 2016-08-21 DIAGNOSIS — Z794 Long term (current) use of insulin: Secondary | ICD-10-CM | POA: Insufficient documentation

## 2016-08-21 DIAGNOSIS — E1122 Type 2 diabetes mellitus with diabetic chronic kidney disease: Secondary | ICD-10-CM | POA: Diagnosis not present

## 2016-08-21 DIAGNOSIS — N185 Chronic kidney disease, stage 5: Secondary | ICD-10-CM

## 2016-08-21 DIAGNOSIS — E1140 Type 2 diabetes mellitus with diabetic neuropathy, unspecified: Secondary | ICD-10-CM | POA: Insufficient documentation

## 2016-08-21 DIAGNOSIS — Z7951 Long term (current) use of inhaled steroids: Secondary | ICD-10-CM | POA: Insufficient documentation

## 2016-08-21 DIAGNOSIS — Z87891 Personal history of nicotine dependence: Secondary | ICD-10-CM | POA: Diagnosis not present

## 2016-08-21 DIAGNOSIS — J449 Chronic obstructive pulmonary disease, unspecified: Secondary | ICD-10-CM | POA: Diagnosis not present

## 2016-08-21 DIAGNOSIS — Z79899 Other long term (current) drug therapy: Secondary | ICD-10-CM | POA: Diagnosis not present

## 2016-08-21 DIAGNOSIS — Z955 Presence of coronary angioplasty implant and graft: Secondary | ICD-10-CM | POA: Diagnosis not present

## 2016-08-21 DIAGNOSIS — N186 End stage renal disease: Secondary | ICD-10-CM | POA: Insufficient documentation

## 2016-08-21 DIAGNOSIS — Z992 Dependence on renal dialysis: Secondary | ICD-10-CM | POA: Diagnosis not present

## 2016-08-21 DIAGNOSIS — I429 Cardiomyopathy, unspecified: Secondary | ICD-10-CM | POA: Insufficient documentation

## 2016-08-21 DIAGNOSIS — Z86718 Personal history of other venous thrombosis and embolism: Secondary | ICD-10-CM | POA: Insufficient documentation

## 2016-08-21 DIAGNOSIS — I2510 Atherosclerotic heart disease of native coronary artery without angina pectoris: Secondary | ICD-10-CM | POA: Insufficient documentation

## 2016-08-21 DIAGNOSIS — F419 Anxiety disorder, unspecified: Secondary | ICD-10-CM | POA: Diagnosis not present

## 2016-08-21 DIAGNOSIS — I132 Hypertensive heart and chronic kidney disease with heart failure and with stage 5 chronic kidney disease, or end stage renal disease: Secondary | ICD-10-CM | POA: Insufficient documentation

## 2016-08-21 DIAGNOSIS — Z95810 Presence of automatic (implantable) cardiac defibrillator: Secondary | ICD-10-CM | POA: Diagnosis not present

## 2016-08-21 DIAGNOSIS — E1151 Type 2 diabetes mellitus with diabetic peripheral angiopathy without gangrene: Secondary | ICD-10-CM | POA: Insufficient documentation

## 2016-08-21 DIAGNOSIS — G4733 Obstructive sleep apnea (adult) (pediatric): Secondary | ICD-10-CM | POA: Insufficient documentation

## 2016-08-21 HISTORY — PX: AV FISTULA PLACEMENT: SHX1204

## 2016-08-21 HISTORY — DX: Polyneuropathy, unspecified: G62.9

## 2016-08-21 LAB — POCT I-STAT 4, (NA,K, GLUC, HGB,HCT)
Glucose, Bld: 106 mg/dL — ABNORMAL HIGH (ref 65–99)
HCT: 34 % — ABNORMAL LOW (ref 36.0–46.0)
Hemoglobin: 11.6 g/dL — ABNORMAL LOW (ref 12.0–15.0)
POTASSIUM: 3.9 mmol/L (ref 3.5–5.1)
SODIUM: 141 mmol/L (ref 135–145)

## 2016-08-21 LAB — GLUCOSE, CAPILLARY
GLUCOSE-CAPILLARY: 102 mg/dL — AB (ref 65–99)
GLUCOSE-CAPILLARY: 93 mg/dL (ref 65–99)

## 2016-08-21 SURGERY — ARTERIOVENOUS (AV) FISTULA CREATION
Anesthesia: General | Site: Arm Upper | Laterality: Right

## 2016-08-21 MED ORDER — SODIUM CHLORIDE 0.9 % IV SOLN
INTRAVENOUS | Status: DC | PRN
  Administered 2016-08-21: 500 mL

## 2016-08-21 MED ORDER — PROPOFOL 500 MG/50ML IV EMUL
INTRAVENOUS | Status: DC | PRN
  Administered 2016-08-21: 50 ug/kg/min via INTRAVENOUS
  Administered 2016-08-21: 70 ug/kg/min via INTRAVENOUS

## 2016-08-21 MED ORDER — FENTANYL CITRATE (PF) 100 MCG/2ML IJ SOLN
INTRAMUSCULAR | Status: AC
  Filled 2016-08-21: qty 2

## 2016-08-21 MED ORDER — DEXTROSE 5 % IV SOLN
1.5000 g | INTRAVENOUS | Status: AC
  Administered 2016-08-21: 1.5 g via INTRAVENOUS

## 2016-08-21 MED ORDER — HEPARIN SODIUM (PORCINE) 1000 UNIT/ML IJ SOLN
2.6000 mL | Freq: Once | INTRAMUSCULAR | Status: AC
  Administered 2016-08-21: 1000 [IU] via INTRAVENOUS
  Filled 2016-08-21: qty 2.6

## 2016-08-21 MED ORDER — PHENYLEPHRINE HCL 10 MG/ML IJ SOLN
INTRAMUSCULAR | Status: DC | PRN
  Administered 2016-08-21 (×4): 80 ug via INTRAVENOUS

## 2016-08-21 MED ORDER — HEMOSTATIC AGENTS (NO CHARGE) OPTIME
TOPICAL | Status: DC | PRN
  Administered 2016-08-21: 1 via TOPICAL

## 2016-08-21 MED ORDER — SODIUM CHLORIDE 0.9 % IV SOLN
INTRAVENOUS | Status: DC
  Administered 2016-08-21: 10:00:00 via INTRAVENOUS

## 2016-08-21 MED ORDER — LIDOCAINE HCL (PF) 1 % IJ SOLN
INTRAMUSCULAR | Status: AC
  Filled 2016-08-21: qty 30

## 2016-08-21 MED ORDER — PHENYLEPHRINE 40 MCG/ML (10ML) SYRINGE FOR IV PUSH (FOR BLOOD PRESSURE SUPPORT)
PREFILLED_SYRINGE | INTRAVENOUS | Status: AC
  Filled 2016-08-21: qty 10

## 2016-08-21 MED ORDER — CHLORHEXIDINE GLUCONATE CLOTH 2 % EX PADS: 6.0000 | MEDICATED_PAD | Freq: Once | CUTANEOUS | Status: DC

## 2016-08-21 MED ORDER — FENTANYL CITRATE (PF) 100 MCG/2ML IJ SOLN
INTRAMUSCULAR | Status: DC | PRN
  Administered 2016-08-21 (×4): 25 ug via INTRAVENOUS

## 2016-08-21 MED ORDER — FENTANYL CITRATE (PF) 100 MCG/2ML IJ SOLN: 25.0000 ug | INTRAMUSCULAR | Status: DC | PRN

## 2016-08-21 MED ORDER — SUCCINYLCHOLINE CHLORIDE 200 MG/10ML IV SOSY
PREFILLED_SYRINGE | INTRAVENOUS | Status: AC
  Filled 2016-08-21: qty 20

## 2016-08-21 MED ORDER — MIDAZOLAM HCL 5 MG/5ML IJ SOLN
INTRAMUSCULAR | Status: DC | PRN
  Administered 2016-08-21 (×2): 1 mg via INTRAVENOUS

## 2016-08-21 MED ORDER — ONDANSETRON HCL 4 MG/2ML IJ SOLN
4.0000 mg | Freq: Once | INTRAMUSCULAR | Status: AC
  Administered 2016-08-21: 4 mg via INTRAVENOUS

## 2016-08-21 MED ORDER — LIDOCAINE-EPINEPHRINE (PF) 1 %-1:200000 IJ SOLN
INTRAMUSCULAR | Status: DC | PRN
  Administered 2016-08-21: 30 mL

## 2016-08-21 MED ORDER — LIDOCAINE-EPINEPHRINE (PF) 1 %-1:200000 IJ SOLN
INTRAMUSCULAR | Status: AC
  Filled 2016-08-21: qty 30

## 2016-08-21 MED ORDER — LIDOCAINE 2% (20 MG/ML) 5 ML SYRINGE
INTRAMUSCULAR | Status: AC
  Filled 2016-08-21: qty 10

## 2016-08-21 MED ORDER — METOPROLOL SUCCINATE 12.5 MG HALF TABLET
12.5000 mg | ORAL_TABLET | ORAL | Status: AC
  Administered 2016-08-21: 12.5 mg via ORAL
  Filled 2016-08-21: qty 1

## 2016-08-21 MED ORDER — ONDANSETRON HCL 4 MG/2ML IJ SOLN
INTRAMUSCULAR | Status: AC
  Filled 2016-08-21: qty 2

## 2016-08-21 MED ORDER — OXYCODONE-ACETAMINOPHEN 5-325 MG PO TABS: 1.0000 | ORAL_TABLET | Freq: Four times a day (QID) | ORAL | 0 refills | Status: DC | PRN

## 2016-08-21 MED ORDER — PROPOFOL 10 MG/ML IV BOLUS
INTRAVENOUS | Status: AC
  Filled 2016-08-21: qty 20

## 2016-08-21 MED ORDER — HEPARIN SODIUM (PORCINE) 1000 UNIT/ML IJ SOLN
INTRAMUSCULAR | Status: DC | PRN
  Administered 2016-08-21: 5000 [IU] via INTRAVENOUS

## 2016-08-21 MED ORDER — DEXTROSE 5 % IV SOLN
INTRAVENOUS | Status: AC
  Filled 2016-08-21: qty 1.5

## 2016-08-21 MED ORDER — MIDAZOLAM HCL 2 MG/2ML IJ SOLN
INTRAMUSCULAR | Status: AC
  Filled 2016-08-21: qty 2

## 2016-08-21 MED ORDER — ROCURONIUM BROMIDE 10 MG/ML (PF) SYRINGE
PREFILLED_SYRINGE | INTRAVENOUS | Status: AC
  Filled 2016-08-21: qty 10

## 2016-08-21 MED ORDER — 0.9 % SODIUM CHLORIDE (POUR BTL) OPTIME
TOPICAL | Status: DC | PRN
  Administered 2016-08-21: 1000 mL

## 2016-08-21 SURGICAL SUPPLY — 32 items
ARMBAND PINK RESTRICT EXTREMIT (MISCELLANEOUS) ×3 IMPLANT
CANISTER SUCTION 2500CC (MISCELLANEOUS) ×3 IMPLANT
CLIP TI MEDIUM 6 (CLIP) ×3 IMPLANT
CLIP TI WIDE RED SMALL 6 (CLIP) ×3 IMPLANT
COVER PROBE W GEL 5X96 (DRAPES) IMPLANT
ELECT REM PT RETURN 9FT ADLT (ELECTROSURGICAL) ×3
ELECTRODE REM PT RTRN 9FT ADLT (ELECTROSURGICAL) ×1 IMPLANT
GLOVE BIO SURGEON STRL SZ7.5 (GLOVE) IMPLANT
GLOVE BIOGEL PI IND STRL 6.5 (GLOVE) ×3 IMPLANT
GLOVE BIOGEL PI INDICATOR 6.5 (GLOVE) ×6
GLOVE SURG SS PI 6.5 STRL IVOR (GLOVE) ×9 IMPLANT
GLOVE SURG SS PI 7.5 STRL IVOR (GLOVE) ×3 IMPLANT
GOWN STRL REUS W/ TWL LRG LVL3 (GOWN DISPOSABLE) ×2 IMPLANT
GOWN STRL REUS W/ TWL XL LVL3 (GOWN DISPOSABLE) ×1 IMPLANT
GOWN STRL REUS W/TWL LRG LVL3 (GOWN DISPOSABLE) ×4
GOWN STRL REUS W/TWL XL LVL3 (GOWN DISPOSABLE) ×2
GRAFT VASC ACUSEAL 4-7X45 (Vascular Products) ×3 IMPLANT
HEMOSTAT SNOW SURGICEL 2X4 (HEMOSTASIS) ×3 IMPLANT
KIT BASIN OR (CUSTOM PROCEDURE TRAY) ×3 IMPLANT
KIT ROOM TURNOVER OR (KITS) ×3 IMPLANT
LIQUID BAND (GAUZE/BANDAGES/DRESSINGS) ×3 IMPLANT
NS IRRIG 1000ML POUR BTL (IV SOLUTION) ×3 IMPLANT
PACK CV ACCESS (CUSTOM PROCEDURE TRAY) ×3 IMPLANT
PAD ARMBOARD 7.5X6 YLW CONV (MISCELLANEOUS) ×6 IMPLANT
SUT GORETEX 6.0 TH-9 30 IN (SUTURE) ×6 IMPLANT
SUT MNCRL AB 4-0 PS2 18 (SUTURE) ×3 IMPLANT
SUT PROLENE 6 0 BV (SUTURE) ×3 IMPLANT
SUT SILK 2 0 SH (SUTURE) ×3 IMPLANT
SUT VIC AB 3-0 SH 27 (SUTURE) ×2
SUT VIC AB 3-0 SH 27X BRD (SUTURE) ×1 IMPLANT
UNDERPAD 30X30 (UNDERPADS AND DIAPERS) ×3 IMPLANT
WATER STERILE IRR 1000ML POUR (IV SOLUTION) ×3 IMPLANT

## 2016-08-21 NOTE — Telephone Encounter (Signed)
Adam at [facility] will contact Novella Rob, [employer] rep [phone])  for information on cannulating this new AVG.

## 2016-08-21 NOTE — Anesthesia Procedure Notes (Signed)
Procedure Name: MAC Date/Time: 08/21/2016 10:40 AM Performed by: Margaree Mackintosh Pre-anesthesia Checklist: Patient identified, Emergency Drugs available, Suction available, Patient being monitored and Timeout performed Patient Re-evaluated:Patient Re-evaluated prior to inductionOxygen Delivery Method: Simple face mask Preoxygenation: Pre-oxygenation with 100% oxygen Intubation Type: IV induction Number of attempts: 1 Placement Confirmation: positive ETCO2 Dental Injury: Teeth and Oropharynx as per pre-operative assessment

## 2016-08-21 NOTE — Progress Notes (Signed)
Report called to [facility] place, spoke with Cynde, answered all questions, people's choice called for transportation, awaiting IV team to de-access HD cath, sister at bedside

## 2016-08-21 NOTE — Anesthesia Preprocedure Evaluation (Signed)
Anesthesia Evaluation  Patient identified by MRN, date of birth, ID band Patient awake    Reviewed: Allergy & Precautions, NPO status , Patient's Chart, lab work & pertinent test results  Airway Mallampati: II  TM Distance: >3 FB     Dental   Pulmonary sleep apnea , COPD, former smoker,    breath sounds clear to auscultation       Cardiovascular hypertension, + CAD, + Peripheral Vascular Disease and +CHF  + Cardiac Defibrillator  Rhythm:Regular Rate:Normal     Neuro/Psych    GI/Hepatic negative GI ROS, Neg liver ROS,   Endo/Other  diabetes  Renal/GU Renal disease     Musculoskeletal   Abdominal   Peds  Hematology  (+) anemia ,   Anesthesia Other Findings   Reproductive/Obstetrics                             Anesthesia Physical Anesthesia Plan  ASA: III  Anesthesia Plan: General   Post-op Pain Management:    Induction: Intravenous  Airway Management Planned:   Additional Equipment:   Intra-op Plan:   Post-operative Plan: Possible Post-op intubation/ventilation  Informed Consent: I have reviewed the patients History and Physical, chart, labs and discussed the procedure including the risks, benefits and alternatives for the proposed anesthesia with the patient or authorized representative who has indicated his/her understanding and acceptance.   Dental advisory given  Plan Discussed with: CRNA and Anesthesiologist  Anesthesia Plan Comments:         Anesthesia Quick Evaluation

## 2016-08-21 NOTE — Progress Notes (Signed)
Dr. Randa Evens stated ok to access diateck catheter for IV fluids.  Got in touch with [employer], Leota Jacobsen [phone], to deactivate the ICD per Dr. Randa Evens.

## 2016-08-21 NOTE — Transfer of Care (Signed)
Immediate Anesthesia Transfer of Care Note  Patient: Brandy Dudley  Procedure(s) Performed: Procedure(s): INSERTION RIGHT ARM ARTERIOVENOUS GRAFT USING 4-7MM X45  CM ACUSEAL GRAFT (Right)  Patient Location: PACU  Anesthesia Type:MAC  Level of Consciousness: awake, alert  and oriented  Airway & Oxygen Therapy: Patient Spontanous Breathing  Post-op Assessment: Report given to RN and Post -op Vital signs reviewed and stable  Post vital signs: Reviewed and stable  Last Vitals:  Vitals:   08/21/16 0820  BP: 113/67  Pulse: 80  Resp: 18  Temp: 36.8 C    Last Pain:  Vitals:   08/21/16 0928  TempSrc:   PainSc: 6       Patients Stated Pain Goal: 5 (08/21/16 0928)  Complications: No apparent anesthesia complications

## 2016-08-21 NOTE — Anesthesia Postprocedure Evaluation (Signed)
Anesthesia Post Note  Patient: Brandy Dudley  Procedure(s) Performed: Procedure(s) (LRB): INSERTION RIGHT ARM ARTERIOVENOUS GRAFT USING 4-7MM X45  CM ACUSEAL GRAFT (Right)  Patient location during evaluation: PACU Level of consciousness: awake Pain management: pain level controlled Vital Signs Assessment: post-procedure vital signs reviewed and stable Respiratory status: spontaneous breathing Cardiovascular status: stable Anesthetic complications: no    Last Vitals:  Vitals:   08/21/16 1213 08/21/16 1230  BP: (!) 138/56 (!) 152/62  Pulse: 79 79  Resp: 14 14  Temp: 36.6 C     Last Pain:  Vitals:   08/21/16 1315  TempSrc:   PainSc: 0-No pain                 EDWARDS,Kushi Kun

## 2016-08-21 NOTE — Discharge Instructions (Signed)
° ° °  08/21/2016 Maegen Senft [id number] August 15, 1951  Surgeon(s): Maeola Harman, MD  Procedure(s): INSERTION RIGHT ARM ARTERIOVENOUS GRAFT USING 4-7MM X45  CM ACUSEAL GRAFT  x Do not stick graft for 1 week; Do not stick directly under incisions

## 2016-08-21 NOTE — Op Note (Signed)
    Patient name: Brandy Dudley MRN: [id number] DOB: [birth year] Sex: female  08/21/2016 Pre-operative Diagnosis: esrd Post-operative diagnosis:  Same Surgeon:  Apolinar Junes C. Randie Heinz, MD Assistant: Beatrice Lecher, [place] Procedure Performed: R arm brachio-axillary 4-63mm Accuseal graft  Indications:  65yo AAF with esrd on dialysis via R femoral tdc. She is in need of permanent access.  Findings: Suitable caliber of the artery at the level of the antecubitum and suitable vein without thickening in the axilla. At the completion of graft placement there was Doppler signals at the right radial artery as well as continuous flow in the runoff vein.   Procedure:  Patient was correctly identified and taken to the operative wishes placed supine on the operating table given antibiotics sterilely prepped and draped in the usual fashion Mac anesthesia induced. Timeout was called we started by using 1% lidocaine with epinephrine to anesthetize above the antecubitum and in the right axilla. We then made 2 incisions and first dissected out our brachial artery above the antecubitum. This was encircled with vessel loop. We then turned our attention to the axilla and dissected down to the skin fascia divided and identified the vein encircled this with vessel loop. We then tunneled between the 2 areas and placed a 4-7 mm tapered graft between them. Patient was given 5000 units of heparin. Attention first towards the vein. It was clamped proximally and distally and opened longitudinally. The graft was trimmed to size and sewn end to side with CV 6 Gore suture. Following this we then flushed and clamped the graft allowed the vein to remain open. We then trimmed our graft to size clamped our artery proximally and distally opened that longitudinally and sewed the graft end-to-side with CV 6 suture as well. We then released our clamps and way to prevent embolism distal to the arm. We had expected Doppler runoff signals in the axillary vein  and radial artery pulse signal that did augment with compression of the graft. Satisfied we obtained hemostasis of both wounds closed in 2 layers with 3-0 Vicryl for Monocryl. The patient laterally from anesthesia and transferred to PACU in stable condition.    Damonie Furney C. Randie Heinz, MD [facility] [place] Office: [phone] Pager: [phone]

## 2016-08-21 NOTE — H&P (Signed)
H+P    History of Present Illness: This is a 65 y.o. female with esrd. She is need of permanent access for T-Th-Sat dialysis. She is currently dialyzing via R fem tdc. No complaints today.  Past Medical History:  Diagnosis Date  . AICD (automatic cardioverter/defibrillator) present    Biotronik Inventra 7 VR-T DX 04/12/15  . Anemia   . Anxiety   . C. difficile colitis   . Cardiomyopathy (HCC)    02/02/16 ([facility]): Mixed ischemic and non-ischemic cardiomyopathy  . CHF (congestive heart failure) (HCC)    systolic  . COPD (chronic obstructive pulmonary disease) (HCC)   . Coronary artery disease   . Diabetes mellitus (HCC)   . DVT (deep venous thrombosis) (HCC)    on coumadin  . ESRD (end stage renal disease) (HCC)    tues/thurs/sat dialysis  . Hypertension   . Neuropathy (HCC)   . Obstructive sleep apnea    no cpap  . Renal disorder   . Wears glasses     Past Surgical History:  Procedure Laterality Date  . AV FISTULA PLACEMENT Left 04/03/2016   Procedure: ARTERIOVENOUS (AV) FISTULA CREATION;  Surgeon: Larina Earthly, MD;  Location: [facility] OR;  Service: Vascular;  Laterality: Left;  . CORONARY ANGIOPLASTY     LCX stent 2010; by [facility] 01/2016 note: 03/2014 LHC/RHC: minor luminal irregularities LAD and RCA with patent stent first marginal branch of CX. RA 11, PAP 58/22 with mean 39, mean wedge pressure 19, LVEDP 27, PVR 3.9, CO 5.1, Cardiac index 2.5.   . FISTULA SUPERFICIALIZATION Left 06/16/2016   Procedure: SUPERFICIALIZATION OF LEFT ARM BRACHIOCEPHALIC ARTERIOVENOUS FISTULA;  Surgeon: Chuck Hint, MD;  Location: [facility] OR;  Service: Vascular;  Laterality: Left;  . INSERTION OF DIALYSIS CATHETER Left 04/03/2016   Procedure: INSERTION OF DIALYSIS CATHETER;  Surgeon: Larina Earthly, MD;  Location: [facility] OR;  Service: Vascular;  Laterality: Left;  . LIGATION OF COMPETING BRANCHES OF ARTERIOVENOUS FISTULA Left 06/16/2016   Procedure: LIGATION OF COMPETING BRANCHES OF left  brachiocephalic ARTERIOVENOUS FISTULA;  Surgeon: Chuck Hint, MD;  Location: [facility] OR;  Service: Vascular;  Laterality: Left;  . PERIPHERAL VASCULAR CATHETERIZATION N/A 07/20/2016   Procedure: A/V Shuntogram/Fistulagram;  Surgeon: Fransisco Hertz, MD;  Location: [facility] INVASIVE CV LAB;  Service: Cardiovascular;  Laterality: N/A;  . PERIPHERAL VASCULAR CATHETERIZATION N/A 07/20/2016   Procedure: Dialysis/Perma Catheter Insertion;  Surgeon: Fransisco Hertz, MD;  Location: [facility] INVASIVE CV LAB;  Service: Cardiovascular;  Laterality: N/A;  . REVISION OF ARTERIOVENOUS GORETEX GRAFT Left 06/16/2016   Procedure: REVISION OF LEFT BRACHIOCEPHALIC ARTERIOVENOUS FISTULA WITH RESECTION OF REDUNDANT SECTION;  Surgeon: Chuck Hint, MD;  Location: [facility] OR;  Service: Vascular;  Laterality: Left;    Allergies  Allergen Reactions  . Entresto [Sacubitril-Valsartan] Other (See Comments)     unknown reaction  . Latex Itching  . Sulfa Antibiotics Itching    Prior to Admission medications   Medication Sig Start Date End Date Taking? Authorizing Provider  acetaminophen (TYLENOL) 325 MG tablet Take 325 mg by mouth every 6 (six) hours as needed for mild pain or fever (For fever >99.5).    Yes Historical Provider, MD  albuterol (PROAIR HFA) 108 (90 Base) MCG/ACT inhaler Inhale 2 puffs into the lungs every 6 (six) hours as needed for wheezing or shortness of breath.    Yes Historical Provider, MD  atorvastatin (LIPITOR) 10 MG tablet Take 10 mg by mouth at bedtime.    Yes  Historical Provider, MD  Budesonide (PULMICORT FLEXHALER) 90 MCG/ACT inhaler Inhale 2 puffs into the lungs 2 (two) times daily.   Yes Historical Provider, MD  famotidine (PEPCID) 20 MG tablet Take 1 tablet (20 mg total) by mouth at bedtime. 07/22/16  Yes Maryann Mikhail, DO  hydrOXYzine (ATARAX/VISTARIL) 50 MG tablet Take 50 mg by mouth every 6 (six) hours as needed for itching.   Yes Historical Provider, MD  insulin glargine (LANTUS) 100 unit/mL SOPN  Inject 0.1 mLs (10 Units total) into the skin at bedtime. 05/09/16  Yes Catarina Hartshorn, MD  insulin lispro (HUMALOG) 100 UNIT/ML injection Inject 0-10 Units into the skin 3 (three) times daily with meals. CBG 0-59 Hypoglycemic Protocol, 60-149 0 units 150-250 5 units, 251-300 8 units, 301-350 10 units, >350 call MD   Yes Historical Provider, MD  Inulin (FIBER CHOICE PO) Take 2 tablets by mouth daily as needed (constipation).   Yes Historical Provider, MD  LORazepam (ATIVAN) 0.5 MG tablet Take 1 tablet (0.5 mg total) by mouth 2 (two) times daily as needed for anxiety. 07/22/16  Yes Maryann Mikhail, DO  metoprolol succinate (TOPROL-XL) 25 MG 24 hr tablet Take 12.5 mg by mouth daily.   Yes Historical Provider, MD  mirtazapine (REMERON) 15 MG tablet Take 1 tablet (15 mg total) by mouth at bedtime. 07/05/16  Yes Dinah C Ngetich, NP  Multiple Vitamins-Minerals (CERTAVITE/ANTIOXIDANTS) TABS Take 1 tablet by mouth at bedtime.   Yes Historical Provider, MD  NON FORMULARY Take 120 mLs by mouth 2 (two) times daily. SF Med Pass -WT Loss   Yes Historical Provider, MD  oxyCODONE-acetaminophen (ROXICET) 5-325 MG tablet Take 1-2 tablets by mouth every 4 (four) hours as needed. Patient taking differently: Take 1-2 tablets by mouth every 4 (four) hours as needed for severe pain (mild pain). One tab for mild pain, and 2 tablets for severe pain 07/22/16  Yes Maryann Mikhail, DO  OXYGEN Inhale 2 L into the lungs as needed.    Yes Historical Provider, MD  saccharomyces boulardii (FLORASTOR) 250 MG capsule Take 250 mg by mouth 2 (two) times daily. For recurrent C. Diff.  Take 1 tablet (250 mg) by mouth twice daily until 2 weeks after completion of antibiotics   Yes Historical Provider, MD  sertraline (ZOLOFT) 25 MG tablet Take 25 mg by mouth at bedtime.    Yes Historical Provider, MD  lactose free nutrition (BOOST PLUS) LIQD Take 237 mLs by mouth daily at 3 pm. Patient taking differently: Take 237 mLs by mouth daily.  07/22/16    Edsel Petrin, DO    Social History   Social History  . Marital status: Single    Spouse name: N/A  . Number of children: N/A  . Years of education: N/A   Occupational History  . Not on file.   Social History Main Topics  . Smoking status: Former Smoker    Types: Cigarettes    Quit date: 03/03/2006  . Smokeless tobacco: Never Used  . Alcohol use No  . Drug use: No  . Sexual activity: No   Other Topics Concern  . Not on file   Social History Narrative  . No narrative on file     History reviewed. No pertinent family history.  ROS:  No complaints related to todays visit    Physical Examination  Vitals:   08/21/16 0820  BP: 113/67  Pulse: 80  Resp: 18  Temp: 98.2 F (36.8 C)   Body mass index is 24.6 kg/m.  General:  WDWN in NAD Pulmonary: normal non-labored breathing, without Rales, rhonchi,  wheezing Cardiac: rrr Abdomen: soft, NT/ND, no masses Vascular Exam/Pulses: palpable r radial pulse Musculoskeletal: no muscle wasting or atrophy  Neurologic: A&O X 3; Appropriate Affect ; SENSATION: normal; MOTOR FUNCTION:  moving all extremities equally. Speech is fluent/normal   CBC    Component Value Date/Time   WBC 11.5 (H) 07/22/2016 1109   RBC 4.15 07/22/2016 1109   HGB 13.3 07/27/2016 0913   HCT 39.0 07/27/2016 0913   PLT 90 (L) 07/22/2016 1109   MCV 79.0 07/22/2016 1109   MCH 25.1 (L) 07/22/2016 1109   MCHC 31.7 07/22/2016 1109   RDW 19.4 (H) 07/22/2016 1109   LYMPHSABS 1.1 07/15/2016 1331   MONOABS 0.9 07/15/2016 1331   EOSABS 0.3 07/15/2016 1331   BASOSABS 0.0 07/15/2016 1331    BMET    Component Value Date/Time   NA 141 07/27/2016 0913   NA 143 06/01/2016   K 4.5 07/27/2016 0913   CL 102 07/22/2016 1109   CO2 24 07/22/2016 1109   GLUCOSE 109 (H) 07/27/2016 0913   BUN 12 07/22/2016 1109   BUN 52 (A) 06/01/2016   CREATININE 2.74 (H) 07/22/2016 1109   CALCIUM 8.2 (L) 07/22/2016 1109   GFRNONAA 17 (L) 07/22/2016 1109   GFRAA 20 (L)  07/22/2016 1109    COAGS: Lab Results  Component Value Date   INR 1.85 07/15/2016   INR 2.62 (H) 05/08/2016   INR 2.65 (H) 05/07/2016       ASSESSMENT/PLAN: This is a 65 y.o. female with esrd on dialysis via R fem tdc in need of permanent access. Will plan RUE av graft. Discussed risks and benefits and will proceed.  Seab Axel C. Randie Heinzain, MD [facility] [place] Office: [phone] Pager: [phone]

## 2016-08-22 ENCOUNTER — Encounter (HOSPITAL_COMMUNITY): Payer: Self-pay | Admitting: Vascular Surgery

## 2016-08-23 LAB — HM DIABETES FOOT EXAM

## 2016-08-24 ENCOUNTER — Encounter: Payer: Self-pay | Admitting: Family

## 2016-08-24 ENCOUNTER — Non-Acute Institutional Stay (SKILLED_NURSING_FACILITY): Payer: Medicare PPO | Admitting: Family

## 2016-08-24 DIAGNOSIS — Z992 Dependence on renal dialysis: Secondary | ICD-10-CM | POA: Diagnosis not present

## 2016-08-24 DIAGNOSIS — N186 End stage renal disease: Secondary | ICD-10-CM

## 2016-08-24 DIAGNOSIS — R634 Abnormal weight loss: Secondary | ICD-10-CM

## 2016-08-24 DIAGNOSIS — J449 Chronic obstructive pulmonary disease, unspecified: Secondary | ICD-10-CM

## 2016-08-24 DIAGNOSIS — I5042 Chronic combined systolic (congestive) and diastolic (congestive) heart failure: Secondary | ICD-10-CM

## 2016-08-24 DIAGNOSIS — E1122 Type 2 diabetes mellitus with diabetic chronic kidney disease: Secondary | ICD-10-CM

## 2016-08-24 NOTE — Progress Notes (Signed)
[facility]:  [facility] Room Number: [phone] Place of Service:  SNF (31) Provider:  Richarda Bladeinah Bryndle Corredor, FNP-C  Oneal GroutPANDEY, MAHIMA, MD  Patient Care Team: Oneal GroutMahima Pandey, MD as PCP - General (Internal Medicine) Laurey Moralealton S McLean, MD as Consulting Physician (Cardiology) Bobbye Riggsavid T Outlaw, MD (Specialist)  Extended Emergency Contact Information Primary Emergency Contact: Haith,[name] Address: [address]          [place], [place] [address] [place] Home Phone: [phone] Relation: Sister Secondary Emergency Contact: Marcello MooresBell,Laurene          [place] Home Phone: [phone] Relation: Sister  Code Status:  Full code Goals of care: Advanced Directive information Advanced Directives 08/24/2016  Does patient have an advance directive? No  Type of Advance Directive -  Does patient want to make changes to advanced directive? -  Copy of advanced directive(s) in chart? -  Would patient like information on creating an advanced directive? -     Chief Complaint  Patient presents with  . Medical Management of Chronic Issues    routine    HPI:  Pt is a 65 y.o. [facility]. She has a medical history of CHF, COPD, ESRD on dialysis, Type 2 DM among other conditions. She is seen in her room today. She denies any acute issues this visit. No recent fall episode or hospital admission since previous visit. She has had another additional 3 pounds weight loss over one month. Facility staff reports no new concerns.     Past Medical History:  Diagnosis Date  . AICD (automatic cardioverter/defibrillator) present    Biotronik Inventra 7 VR-T DX 04/12/15  . Anemia   . Anxiety   . C. difficile colitis   . Cardiomyopathy (HCC)    02/02/16 ([facility]): Mixed ischemic and non-ischemic cardiomyopathy  . CHF (congestive heart failure) (HCC)    systolic  . COPD  (chronic obstructive pulmonary disease) (HCC)   . Coronary artery disease   . Diabetes mellitus (HCC)   . DVT (deep venous thrombosis) (HCC)    on coumadin  . ESRD (end stage renal disease) (HCC)    tues/thurs/sat dialysis  . Hypertension   . Neuropathy (HCC)   . Obstructive sleep apnea    no cpap  . Renal disorder   . Wears glasses    Past Surgical History:  Procedure Laterality Date  . AV FISTULA PLACEMENT Left 04/03/2016   Procedure: ARTERIOVENOUS (AV) FISTULA CREATION;  Surgeon: Larina Earthlyodd F Early, MD;  [facility];  Service: Vascular;  Laterality: Left;  . AV FISTULA PLACEMENT Right 08/21/2016   Procedure: INSERTION RIGHT ARM ARTERIOVENOUS GRAFT USING 4-7MM X45  CM ACUSEAL GRAFT;  Surgeon: Maeola HarmanBrandon Christopher Cain, MD;  [facility];  Service: Vascular;  Laterality: Right;  . CORONARY ANGIOPLASTY     LCX stent 2010; by [facility] 01/2016 note: 03/2014 LHC/RHC: minor luminal irregularities LAD and RCA with patent stent first marginal branch of CX. RA 11, PAP 58/22 with mean 39, mean wedge pressure 19, LVEDP 27, PVR 3.9, CO 5.1, Cardiac index 2.5.   . FISTULA SUPERFICIALIZATION Left 06/16/2016   Procedure: SUPERFICIALIZATION OF LEFT ARM BRACHIOCEPHALIC ARTERIOVENOUS FISTULA;  Surgeon: Chuck Hinthristopher S Dickson, MD;  [facility];  Service: Vascular;  Laterality: Left;  . INSERTION OF DIALYSIS CATHETER Left 04/03/2016   Procedure: INSERTION OF DIALYSIS CATHETER;  Surgeon: Larina Earthlyodd F Early, MD;  Location: [facility] OR;  Service: Vascular;  Laterality: Left;  . LIGATION OF COMPETING BRANCHES OF ARTERIOVENOUS FISTULA Left 06/16/2016   Procedure: LIGATION OF COMPETING BRANCHES OF left brachiocephalic ARTERIOVENOUS FISTULA;  Surgeon: Chuck Hint, MD;  Location: [facility] OR;  Service: Vascular;  Laterality: Left;  . PERIPHERAL VASCULAR CATHETERIZATION N/A 07/20/2016   Procedure: A/V Shuntogram/Fistulagram;  Surgeon: Fransisco Hertz, MD;  Location: [facility] INVASIVE CV LAB;  Service: Cardiovascular;  Laterality:  N/A;  . PERIPHERAL VASCULAR CATHETERIZATION N/A 07/20/2016   Procedure: Dialysis/Perma Catheter Insertion;  Surgeon: Fransisco Hertz, MD;  Location: [facility] INVASIVE CV LAB;  Service: Cardiovascular;  Laterality: N/A;  . REVISION OF ARTERIOVENOUS GORETEX GRAFT Left 06/16/2016   Procedure: REVISION OF LEFT BRACHIOCEPHALIC ARTERIOVENOUS FISTULA WITH RESECTION OF REDUNDANT SECTION;  Surgeon: Chuck Hint, MD;  Location: [facility] OR;  Service: Vascular;  Laterality: Left;    Allergies  Allergen Reactions  . Entresto [Sacubitril-Valsartan] Other (See Comments)     unknown reaction  . Latex Itching  . Sulfa Antibiotics Itching      Medication List       Accurate as of 08/24/16 11:56 AM. Always use your most recent med list.          acetaminophen 325 MG tablet Commonly known as:  TYLENOL Take 325 mg by mouth every 6 (six) hours as needed for mild pain or fever (For fever >99.5).   atorvastatin 10 MG tablet Commonly known as:  LIPITOR Take 10 mg by mouth at bedtime.   CERTAVITE/ANTIOXIDANTS Tabs Take 1 tablet by mouth at bedtime.   famotidine 20 MG tablet Commonly known as:  PEPCID Take 1 tablet (20 mg total) by mouth at bedtime.   FIBER CHOICE PO Take 2 tablets by mouth daily as needed (constipation).   hydrOXYzine 50 MG tablet Commonly known as:  ATARAX/VISTARIL Take 50 mg by mouth every 6 (six) hours as needed for itching.   insulin glargine 100 unit/mL Sopn Commonly known as:  LANTUS Inject 0.1 mLs (10 Units total) into the skin at bedtime.   insulin lispro 100 UNIT/ML injection Commonly known as:  HUMALOG Inject 0-10 Units into the skin 3 (three) times daily with meals. CBG 0-59 Hypoglycemic Protocol, 60-149 0 units 150-250 5 units, 251-300 8 units, 301-350 10 units, >350 call MD   LORazepam 0.5 MG tablet Commonly known as:  ATIVAN Take 1 tablet (0.5 mg total) by mouth 2 (two) times daily as needed for anxiety.   metoprolol succinate 25 MG 24 hr tablet Commonly known  as:  TOPROL-XL Take 12.5 mg by mouth daily.   mirtazapine 15 MG tablet Commonly known as:  REMERON Take 1 tablet (15 mg total) by mouth at bedtime.   oxyCODONE-acetaminophen 5-325 MG tablet Commonly known as:  PERCOCET/ROXICET Take by mouth. Take one tablet every 4 hours for mild pain; take two tablets every 4 hours fo severe pain.   OXYGEN Inhale 2 L into the lungs as needed.   PROAIR HFA 108 (90 Base) MCG/ACT inhaler Generic drug:  albuterol Inhale 2 puffs into the lungs every 6 (six) hours as needed for wheezing or shortness of breath.   PULMICORT FLEXHALER 90 MCG/ACT inhaler Generic drug:  Budesonide Inhale 2 puffs into the lungs 2 (two) times daily.   sertraline 25 MG tablet Commonly known as:  ZOLOFT Take 25 mg by mouth at bedtime.       Review of Systems  Constitutional: Negative for activity change, appetite change, chills, fatigue and fever.  HENT:  Negative for congestion, rhinorrhea, sinus pressure, sneezing and sore throat.   Eyes: Negative.   Respiratory: Negative for cough, chest tightness, shortness of breath and wheezing.   Cardiovascular: Negative for chest pain and palpitations.  Gastrointestinal: Negative for abdominal distention, constipation, diarrhea, nausea and vomiting.       Abdominal Hernia.   Endocrine: Negative.   Genitourinary: Negative for dysuria, frequency and urgency.  Musculoskeletal: Positive for gait problem.  Skin: Negative for color change, pallor and rash.       Left arm AV fistula.   Neurological: Negative for dizziness, seizures, syncope, light-headedness, numbness and headaches.  Psychiatric/Behavioral: Negative for agitation, confusion, hallucinations, sleep disturbance and suicidal ideas. The patient is not nervous/anxious.     Immunization History  Administered Date(s) Administered  . PPD Test 03/01/2016, 04/10/2016   Pertinent  Health Maintenance Due  Topic Date Due  . INFLUENZA VACCINE  06/27/2016  . FOOT EXAM   11/27/2016 (Originally 07/23/1961)  . MAMMOGRAM  11/27/2016 (Originally 07/23/2000)  . OPHTHALMOLOGY EXAM  11/27/2016 (Originally 07/23/1961)  . URINE MICROALBUMIN  11/27/2016 (Originally 07/23/1961)  . DEXA SCAN  11/27/2016 (Originally 07/24/2015)  . COLONOSCOPY  11/27/2016 (Originally 07/23/2000)  . PNA vac Low Risk Adult (1 of 2 - PCV13) 11/27/2016 (Originally 07/24/2015)  . HEMOGLOBIN A1C  01/15/2017   No flowsheet data found. Functional Status Survey:    Vitals:   08/24/16 1132  BP: (!) 110/58  Pulse: 83  Resp: 20  Temp: 97.4 F (36.3 C)  SpO2: 95%  Weight: 163 lb (73.9 kg)  Height: 5\' 8"  (1.727 m)   Body mass index is 24.78 kg/m. Physical Exam  Constitutional: She is oriented to person, place, and time. She appears well-developed and well-nourished. No distress.  HENT:  Head: Normocephalic.  Mouth/Throat: Oropharynx is clear and moist. No oropharyngeal exudate.  Eyes: Conjunctivae and EOM are normal. Pupils are equal, round, and reactive to light. Right eye exhibits no discharge. Left eye exhibits no discharge. No scleral icterus.  Neck: Normal range of motion. No JVD present. No thyromegaly present.  Cardiovascular: Normal rate, regular rhythm, normal heart sounds and intact distal pulses.  Exam reveals no gallop and no friction rub.   No murmur heard. Pulmonary/Chest: Effort normal and breath sounds normal. No respiratory distress. She has no wheezes. She has no rales.  Abdominal: Soft. Bowel sounds are normal. She exhibits no distension. There is no rebound and no guarding.  Umbilical Hernia reducible but tender to touch.   Genitourinary:  Genitourinary Comments: On Hemo dialysis x 3 per week   Musculoskeletal: She exhibits no edema, tenderness or deformity.  Unsteady gait   Lymphadenopathy:    She has no cervical adenopathy.  Neurological: She is oriented to person, place, and time.  Skin: Skin is warm and dry. No rash noted. No erythema. No pallor.  Left AV fistula  incision site dry clean and intact. Right ischium dime size skin lesion raw redness noted. No drainage noted. Surrounding skin without any signs of infections.    Psychiatric: She has a normal mood and affect.    Labs reviewed:  Recent Labs  04/03/16 1423  05/08/16 0436  07/15/16 1800 07/16/16 0545 07/17/16 1400  07/21/16 0444 07/22/16 0718 07/22/16 1109 07/27/16 0913 08/21/16 0928  NA  --   < > 137  < > 135 135 136  < > 135 137 135 141 141  K  --   < > 3.5  < > 4.3 4.0 4.1  < > 3.6  5.0 3.5 4.5 3.9  CL  --   < > 103  < > 97* 97* 100*  < > 97* 100* 102  --   --   CO2  --   < > 25  < > 25 26 29   < > 27 29 24   --   --   GLUCOSE  --   < > 76  < > 152* 230* 124*  < > 82 68 64* 109* 106*  BUN  --   < > 28*  < > 40* 26* 40*  < > 30* 18 12  --   --   CREATININE  --   < > 6.52*  < > 6.36* 4.62* 5.64*  < > 4.49* 3.82* 2.74*  --   --   CALCIUM  --   < > 8.0*  < > 8.5* 8.6* 8.6*  < > 8.2* 8.2* 8.2*  --   --   MG 2.1  --  1.9  --  2.2  --   --   --   --   --   --   --   --   PHOS  --   < >  --   < > 4.7* 4.3 4.3  --   --   --  1.7*  --   --   < > = values in this interval not displayed.  Recent Labs  03/04/16 0000  03/21/16 0807  05/06/16 1240  05/16/16 05/23/16 06/01/16  07/16/16 0545 07/17/16 1400 07/22/16 1109  AST 43*  < > 22  < > 18  --  18 25 15   --   --   --   --   ALT 15  < > 11*  < > 11*  --  12 13 8   --   --   --   --   ALKPHOS 207*  < > 110  < > 180*  --  196* 222* 234*  --   --   --   --   BILITOT 2.3*  --  1.5*  --  1.5*  --   --   --   --   --   --   --   --   PROT 6.9  --  5.7*  --  7.3  --   --   --   --   --   --   --   --   ALBUMIN 2.9*  --  2.1*  < > 2.4*  < >  --   --   --   < > 1.9* 1.9* 2.0*  < > = values in this interval not displayed.  Recent Labs  03/20/16 1227  05/06/16 1240  07/15/16 1331  07/21/16 0444 07/22/16 0718 07/22/16 1109 07/27/16 0913 08/21/16 0928  WBC 7.5  < > 5.3  < > 13.3*  < > 12.5* 9.8 11.5*  --   --   NEUTROABS 5.7  --  3.3  --   11.0*  --   --   --   --   --   --   HGB 9.0*  < > 9.3*  < > 10.2*  < > 9.7* 10.1* 10.4* 13.3 11.6*  HCT 28.4*  < > 29.8*  < > 31.4*  < > 30.8* 32.6* 32.8* 39.0 34.0*  MCV 85.5  < > 85.4  < > 78.1  < > 78.4 79.1 79.0  --   --   PLT 165  < >  268  < > 333  < > 125* 128* 90*  --   --   < > = values in this interval not displayed. Lab Results  Component Value Date   TSH 1.993 03/22/2016   Lab Results  Component Value Date   HGBA1C 5.6 07/15/2016   Lab Results  Component Value Date   CHOL 49 03/17/2016   HDL 14 (A) 03/17/2016   LDLCALC 21 03/17/2016   TRIG 68 03/17/2016    Assessment/Plan CHF Stable. Exam findings negative. Continue to monitor weight.    COPD Stable. Continue Pulmicort and  ProAir.   Type 2 DM  CBG's ranging in the 90's-190's. Continue on Lantus  And Humalog per SSI  ERSD On dialysis x 3 per week. Continue to monitor BMP  Weight loss  Has had additional 3 pounds weight loss since previous visit. Refused protein supplements.    Family/ staff Communication: Reviewed plan of care with patient and facility Nurse supervisor  Labs/tests ordered:  None

## 2016-08-25 ENCOUNTER — Other Ambulatory Visit (HOSPITAL_COMMUNITY): Payer: Self-pay | Admitting: Gastroenterology

## 2016-08-25 DIAGNOSIS — R1011 Right upper quadrant pain: Secondary | ICD-10-CM

## 2016-08-25 DIAGNOSIS — K828 Other specified diseases of gallbladder: Secondary | ICD-10-CM

## 2016-08-26 ENCOUNTER — Encounter (HOSPITAL_COMMUNITY): Payer: Self-pay

## 2016-08-26 ENCOUNTER — Emergency Department (HOSPITAL_COMMUNITY): Payer: Medicare PPO

## 2016-08-26 ENCOUNTER — Emergency Department (HOSPITAL_COMMUNITY)
Admission: EM | Admit: 2016-08-26 | Discharge: 2016-08-27 | Disposition: A | Discharge status: Skilled Nursing Facility | Payer: Medicare PPO | Attending: Emergency Medicine | Admitting: Emergency Medicine

## 2016-08-26 DIAGNOSIS — I2510 Atherosclerotic heart disease of native coronary artery without angina pectoris: Secondary | ICD-10-CM | POA: Diagnosis not present

## 2016-08-26 DIAGNOSIS — N186 End stage renal disease: Secondary | ICD-10-CM | POA: Insufficient documentation

## 2016-08-26 DIAGNOSIS — I132 Hypertensive heart and chronic kidney disease with heart failure and with stage 5 chronic kidney disease, or end stage renal disease: Secondary | ICD-10-CM | POA: Diagnosis not present

## 2016-08-26 DIAGNOSIS — Z794 Long term (current) use of insulin: Secondary | ICD-10-CM | POA: Insufficient documentation

## 2016-08-26 DIAGNOSIS — E1140 Type 2 diabetes mellitus with diabetic neuropathy, unspecified: Secondary | ICD-10-CM | POA: Insufficient documentation

## 2016-08-26 DIAGNOSIS — Z87891 Personal history of nicotine dependence: Secondary | ICD-10-CM | POA: Insufficient documentation

## 2016-08-26 DIAGNOSIS — Z79899 Other long term (current) drug therapy: Secondary | ICD-10-CM | POA: Diagnosis not present

## 2016-08-26 DIAGNOSIS — Z91040 Latex allergy status: Secondary | ICD-10-CM | POA: Insufficient documentation

## 2016-08-26 DIAGNOSIS — E1122 Type 2 diabetes mellitus with diabetic chronic kidney disease: Secondary | ICD-10-CM | POA: Diagnosis not present

## 2016-08-26 DIAGNOSIS — R1011 Right upper quadrant pain: Secondary | ICD-10-CM

## 2016-08-26 DIAGNOSIS — J449 Chronic obstructive pulmonary disease, unspecified: Secondary | ICD-10-CM | POA: Diagnosis not present

## 2016-08-26 DIAGNOSIS — I509 Heart failure, unspecified: Secondary | ICD-10-CM | POA: Insufficient documentation

## 2016-08-26 LAB — CBC WITH DIFFERENTIAL/PLATELET
BASOS PCT: 0 %
Basophils Absolute: 0 10*3/uL (ref 0.0–0.1)
EOS ABS: 0.2 10*3/uL (ref 0.0–0.7)
EOS PCT: 1 %
HEMATOCRIT: 30.5 % — AB (ref 36.0–46.0)
Hemoglobin: 9.2 g/dL — ABNORMAL LOW (ref 12.0–15.0)
LYMPHS PCT: 8 %
Lymphs Abs: 1.6 10*3/uL (ref 0.7–4.0)
MCH: 25.1 pg — AB (ref 26.0–34.0)
MCHC: 30.2 g/dL (ref 30.0–36.0)
MCV: 83.3 fL (ref 78.0–100.0)
Monocytes Absolute: 1.2 10*3/uL — ABNORMAL HIGH (ref 0.1–1.0)
Monocytes Relative: 6 %
NEUTROS ABS: 16.4 10*3/uL — AB (ref 1.7–7.7)
NEUTROS PCT: 85 %
Platelets: 258 10*3/uL (ref 150–400)
RBC: 3.66 MIL/uL — ABNORMAL LOW (ref 3.87–5.11)
RDW: 18.7 % — ABNORMAL HIGH (ref 11.5–15.5)
WBC: 19.4 10*3/uL — ABNORMAL HIGH (ref 4.0–10.5)

## 2016-08-26 LAB — LIPASE, BLOOD: LIPASE: 22 U/L (ref 11–51)

## 2016-08-26 LAB — COMPREHENSIVE METABOLIC PANEL
ALT: 12 U/L — AB (ref 14–54)
AST: 25 U/L (ref 15–41)
Albumin: 1.8 g/dL — ABNORMAL LOW (ref 3.5–5.0)
Alkaline Phosphatase: 194 U/L — ABNORMAL HIGH (ref 38–126)
Anion gap: 11 (ref 5–15)
BUN: 14 mg/dL (ref 6–20)
CHLORIDE: 100 mmol/L — AB (ref 101–111)
CO2: 26 mmol/L (ref 22–32)
CREATININE: 3.02 mg/dL — AB (ref 0.44–1.00)
Calcium: 8.1 mg/dL — ABNORMAL LOW (ref 8.9–10.3)
GFR, EST AFRICAN AMERICAN: 17 mL/min — AB (ref >60)
GFR, EST NON AFRICAN AMERICAN: 15 mL/min — AB (ref >60)
Glucose, Bld: 118 mg/dL — ABNORMAL HIGH (ref 65–99)
Potassium: 3.6 mmol/L (ref 3.5–5.1)
Sodium: 137 mmol/L (ref 135–145)
TOTAL PROTEIN: 8 g/dL (ref 6.5–8.1)
Total Bilirubin: 0.7 mg/dL (ref 0.3–1.2)

## 2016-08-26 MED ORDER — ONDANSETRON HCL 4 MG/2ML IJ SOLN: 4.0000 mg | Freq: Once | INTRAMUSCULAR | Status: DC

## 2016-08-26 MED ORDER — GI COCKTAIL ~~LOC~~
30.0000 mL | Freq: Once | ORAL | Status: DC
  Filled 2016-08-26: qty 30

## 2016-08-26 MED ORDER — HYDROMORPHONE HCL 1 MG/ML IJ SOLN: 0.5000 mg | Freq: Once | INTRAMUSCULAR | Status: DC

## 2016-08-26 MED ORDER — ONDANSETRON 4 MG PO TBDP
4.0000 mg | ORAL_TABLET | Freq: Once | ORAL | Status: AC
  Administered 2016-08-26: 4 mg via ORAL
  Filled 2016-08-26: qty 1

## 2016-08-26 MED ORDER — OXYCODONE HCL 5 MG PO TABS
5.0000 mg | ORAL_TABLET | Freq: Once | ORAL | Status: AC
  Administered 2016-08-26: 5 mg via ORAL
  Filled 2016-08-26: qty 1

## 2016-08-26 NOTE — ED Notes (Signed)
Patient transported to Ultrasound 

## 2016-08-26 NOTE — Discharge Instructions (Signed)
Follow up with your family doc in a week.  Return for sudden worsening pain, fever, uncontrolled vomiting.

## 2016-08-26 NOTE — ED Triage Notes (Signed)
To triage via EMS.  Pt had dialysis today.  While there ate a piece of pizza and then started having abd cramping and nausea.  Pt takes dialysis in right upper thigh, has grafts in bilateral arms.  BP taken on left LE.

## 2016-08-26 NOTE — ED Provider Notes (Signed)
[facility]-EMERGENCY DEPT Provider Note   CSN: [id number] Arrival date & time: 08/26/16  1745     History   Chief Complaint Chief Complaint  Patient presents with  . Abdominal Pain    HPI Brandy Dudley is a 65 y.o. female.  65 yo F with a chief complaint of right upper quadrant abdominal pain. This been going on for quite some time has a known chronic issue for her. She states that she had a slice of salty pizza while she was getting dialysis today and started having onset of the pain. Typical of her chronic pain. Denies fevers denies vomiting denies nausea. She states that her gallbladder has a problem with it and it needs to be lasered because she is not a candidate for surgery.   The history is provided by the patient.  Abdominal Pain   This is a chronic problem. The current episode started 1 to 2 hours ago. The problem occurs constantly. The problem has not changed since onset.The pain is associated with eating. The pain is located in the RUQ. The pain is at a severity of 6/10. The pain is mild. Pertinent negatives include anorexia, fever, nausea, vomiting, dysuria, headaches, arthralgias and myalgias. The symptoms are aggravated by eating. Nothing relieves the symptoms. Past workup includes GI consult, CT scan and ultrasound.    Past Medical History:  Diagnosis Date  . AICD (automatic cardioverter/defibrillator) present    Biotronik Inventra 7 VR-T DX 04/12/15  . Anemia   . Anxiety   . C. difficile colitis   . Cardiomyopathy (HCC)    02/02/16 ([facility]): Mixed ischemic and non-ischemic cardiomyopathy  . CHF (congestive heart failure) (HCC)    systolic  . COPD (chronic obstructive pulmonary disease) (HCC)   . Coronary artery disease   . Diabetes mellitus (HCC)   . DVT (deep venous thrombosis) (HCC)    on coumadin  . ESRD (end stage renal disease) (HCC)    tues/thurs/sat dialysis  . Hypertension   . Neuropathy (HCC)   . Obstructive sleep apnea    no cpap  . Renal disorder    . Wears glasses     Patient Active Problem List   Diagnosis Date Noted  . Protein-calorie malnutrition, severe 07/18/2016  . Positive blood culture 07/17/2016  . Atherosclerotic peripheral vascular disease (HCC) 07/17/2016  . Bacteremia 07/17/2016  . Volume overload 07/15/2016  . Leukocytosis 07/15/2016  . Depression 07/05/2016  . Umbilical hernia without obstruction or gangrene 07/05/2016  . Generalized anxiety disorder 07/05/2016  . Loss of weight 07/05/2016  . Chronic combined systolic and diastolic CHF (congestive heart failure) (HCC) 05/07/2016  . RUQ pain 05/06/2016  . 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 Anemia of chronic renal failure, stage 5 (HCC) 04/14/2016  . ESRD on dialysis (HCC)   . History of Clostridium difficile colitis 03/20/2016  . COPD (chronic obstructive pulmonary disease) (HCC) 03/20/2016  . Obstructive sleep apnea 03/20/2016  . AICD (automatic cardioverter/defibrillator) present 03/20/2016  . Physical deconditioning 03/02/2016  . DVT (deep venous thrombosis), right 03/02/2016  . Cardiomyopathy, ischemic 03/02/2016  . Coronary artery disease involving native coronary artery of native heart without angina pectoris 03/02/2016  . Diabetes mellitus type 2 in obese (HCC) 03/02/2016    Past Surgical History:  Procedure Laterality Date  . AV FISTULA PLACEMENT Left 04/03/2016   Procedure: ARTERIOVENOUS (AV) FISTULA CREATION;  Surgeon: Larina Earthlyodd F Early, MD;  [facility];  Service: Vascular;  Laterality: Left;  .  AV FISTULA PLACEMENT Right 08/21/2016   Procedure: INSERTION RIGHT ARM ARTERIOVENOUS GRAFT USING 4-7MM X45  CM ACUSEAL GRAFT;  Surgeon: Maeola Harman, MD;  Location: [facility] OR;  Service: Vascular;  Laterality: Right;  . CORONARY ANGIOPLASTY     LCX stent 2010; by [facility] 01/2016 note: 03/2014 LHC/RHC: minor luminal irregularities LAD and RCA with patent stent first marginal branch of CX. RA 11, PAP 58/22  with mean 39, mean wedge pressure 19, LVEDP 27, PVR 3.9, CO 5.1, Cardiac index 2.5.   . FISTULA SUPERFICIALIZATION Left 06/16/2016   Procedure: SUPERFICIALIZATION OF LEFT ARM BRACHIOCEPHALIC ARTERIOVENOUS FISTULA;  Surgeon: Chuck Hint, MD;  Location: [facility] OR;  Service: Vascular;  Laterality: Left;  . INSERTION OF DIALYSIS CATHETER Left 04/03/2016   Procedure: INSERTION OF DIALYSIS CATHETER;  Surgeon: Larina Earthly, MD;  Location: [facility] OR;  Service: Vascular;  Laterality: Left;  . LIGATION OF COMPETING BRANCHES OF ARTERIOVENOUS FISTULA Left 06/16/2016   Procedure: LIGATION OF COMPETING BRANCHES OF left brachiocephalic ARTERIOVENOUS FISTULA;  Surgeon: Chuck Hint, MD;  Location: [facility] OR;  Service: Vascular;  Laterality: Left;  . PERIPHERAL VASCULAR CATHETERIZATION N/A 07/20/2016   Procedure: A/V Shuntogram/Fistulagram;  Surgeon: Fransisco Hertz, MD;  Location: [facility] INVASIVE CV LAB;  Service: Cardiovascular;  Laterality: N/A;  . PERIPHERAL VASCULAR CATHETERIZATION N/A 07/20/2016   Procedure: Dialysis/Perma Catheter Insertion;  Surgeon: Fransisco Hertz, MD;  Location: [facility] INVASIVE CV LAB;  Service: Cardiovascular;  Laterality: N/A;  . REVISION OF ARTERIOVENOUS GORETEX GRAFT Left 06/16/2016   Procedure: REVISION OF LEFT BRACHIOCEPHALIC ARTERIOVENOUS FISTULA WITH RESECTION OF REDUNDANT SECTION;  Surgeon: Chuck Hint, MD;  Location: [facility] OR;  Service: Vascular;  Laterality: Left;    OB History    No data available       Home Medications    Prior to Admission medications   Medication Sig Start Date End Date Taking? Authorizing Provider  acetaminophen (TYLENOL) 325 MG tablet Take 325 mg by mouth every 6 (six) hours as needed for mild pain or fever (For fever >99.5).    Yes Historical Provider, MD  albuterol (PROAIR HFA) 108 (90 Base) MCG/ACT inhaler Inhale 2 puffs into the lungs every 6 (six) hours as needed for wheezing or shortness of breath.    Yes Historical Provider, MD  albuterol  (PROVENTIL) (2.5 MG/3ML) 0.083% nebulizer solution Take 2.5 mg by nebulization every 6 (six) hours as needed for wheezing or shortness of breath.   Yes Historical Provider, MD  atorvastatin (LIPITOR) 10 MG tablet Take 10 mg by mouth at bedtime.    Yes Historical Provider, MD  budesonide (PULMICORT) 0.5 MG/2ML nebulizer solution Take 0.5 mg by nebulization 2 (two) times daily.   Yes Historical Provider, MD  carvedilol (COREG) 3.125 MG tablet Take 3.125 mg by mouth 3 (three) times a week. Twice on Monday, Wednesday and Friday   Yes Historical Provider, MD  famotidine (PEPCID) 20 MG tablet Take 1 tablet (20 mg total) by mouth at bedtime. 07/22/16  Yes Maryann Mikhail, DO  hydrOXYzine (ATARAX/VISTARIL) 50 MG tablet Take 50 mg by mouth every 6 (six) hours as needed for itching.   Yes Historical Provider, MD  insulin glargine (LANTUS) 100 unit/mL SOPN Inject 0.1 mLs (10 Units total) into the skin at bedtime. 05/09/16  Yes Catarina Hartshorn, MD  insulin lispro (HUMALOG) 100 UNIT/ML injection Inject 0-10 Units into the skin 3 (three) times daily as needed for high blood sugar (CBG >180). CBG 0-59 Hypoglycemic Protocol, 60-149 0  units 150-250 5 units, 251-300 8 units, 301-350 10 units, >350 call MD   Yes Historical Provider, MD  LORazepam (ATIVAN) 0.5 MG tablet Take 1 tablet (0.5 mg total) by mouth 2 (two) times daily as needed for anxiety. 07/22/16  Yes Maryann Mikhail, DO  Multiple Vitamins-Minerals (CERTAVITE/ANTIOXIDANTS) TABS Take 1 tablet by mouth at bedtime.   Yes Historical Provider, MD  saccharomyces boulardii (FLORASTOR) 250 MG capsule Take 250 mg by mouth 2 (two) times daily.   Yes Historical Provider, MD  vancomycin (VANCOCIN) 125 MG capsule Take 125 mg by mouth 4 (four) times daily.   Yes Historical Provider, MD    Family History History reviewed. No pertinent family history.  Social History Social History  Substance Use Topics  . Smoking status: Former Smoker    Types: Cigarettes    Quit date:  03/03/2006  . Smokeless tobacco: Never Used  . Alcohol use No     Allergies   Entresto [sacubitril-valsartan]; Latex; and Sulfa antibiotics   Review of Systems Review of Systems  Constitutional: Negative for chills and fever.  HENT: Negative for congestion and rhinorrhea.   Eyes: Negative for redness and visual disturbance.  Respiratory: Negative for shortness of breath and wheezing.   Cardiovascular: Negative for chest pain and palpitations.  Gastrointestinal: Positive for abdominal pain. Negative for anorexia, nausea and vomiting.  Genitourinary: Negative for dysuria and urgency.  Musculoskeletal: Negative for arthralgias and myalgias.  Skin: Negative for pallor and wound.  Neurological: Negative for dizziness and headaches.     Physical Exam Updated Vital Signs BP 98/62 (BP Location: Other (Comment)) Comment (BP Location): R calf  Pulse 86   Temp 97.7 F (36.5 C) (Oral)   Resp 18   SpO2 96%   Physical Exam  Constitutional: She is oriented to person, place, and time. She appears well-developed and well-nourished. No distress.  HENT:  Head: Normocephalic and atraumatic.  Eyes: EOM are normal. Pupils are equal, round, and reactive to light.  Neck: Normal range of motion. Neck supple.  Cardiovascular: Normal rate and regular rhythm.  Exam reveals no gallop and no friction rub.   No murmur heard. Pulmonary/Chest: Effort normal. She has no wheezes. She has no rales.  Abdominal: Soft. She exhibits no distension and no mass. There is tenderness (RUQ, mild, negative murphys). There is no guarding.  Musculoskeletal: She exhibits no edema or tenderness.  Neurological: She is alert and oriented to person, place, and time.  Skin: Skin is warm and dry. She is not diaphoretic.  Psychiatric: She has a normal mood and affect. Her behavior is normal.  Nursing note and vitals reviewed.    ED Treatments / Results  Labs (all labs ordered are listed, but only abnormal results are  displayed) Labs Reviewed  CBC WITH DIFFERENTIAL/PLATELET - Abnormal; Notable for the following:       Result Value   WBC 19.4 (*)    RBC 3.66 (*)    Hemoglobin 9.2 (*)    HCT 30.5 (*)    MCH 25.1 (*)    RDW 18.7 (*)    Neutro Abs 16.4 (*)    Monocytes Absolute 1.2 (*)    All other components within normal limits  COMPREHENSIVE METABOLIC PANEL - Abnormal; Notable for the following:    Chloride 100 (*)    Glucose, Bld 118 (*)    Creatinine, Ser 3.02 (*)    Calcium 8.1 (*)    Albumin 1.8 (*)    ALT 12 (*)  Alkaline Phosphatase 194 (*)    GFR calc non Af Amer 15 (*)    GFR calc Af Amer 17 (*)    All other components within normal limits  LIPASE, BLOOD    EKG  EKG Interpretation  Date/Time:  Saturday August 26 2016 19:38:33 EDT Ventricular Rate:  90 PR Interval:    QRS Duration: 117 QT Interval:  399 QTC Calculation: 489 R Axis:   138 Text Interpretation:  Sinus rhythm Atrial premature complex Short PR interval Nonspecific intraventricular conduction delay Borderline low voltage, extremity leads No significant change since last tracing Confirmed by Evamae Rowen MD, Reuel Boom ([id number]) on 08/26/2016 8:04:44 PM Also confirmed by Adela Lank MD, DANIEL [phone]), editor [place] CT, Jola Babinski [phone])  on 08/27/2016 7:49:58 AM       Radiology Ct Abdomen Pelvis Wo Contrast  Result Date: 08/26/2016 CLINICAL DATA:  Abdominal pain.  History of colitis. EXAM: CT ABDOMEN AND PELVIS WITHOUT CONTRAST TECHNIQUE: Multidetector CT imaging of the abdomen and pelvis was performed following the standard protocol without IV contrast. COMPARISON:  CT 08/ 20/2017 FINDINGS: Lower chest: Improved effusions at the lung bases. Linear atelectasis remains. Heart is enlarged. Hepatobiliary: No focal hepatic lesion on noncontrast exam. Pancreas: Pancreas is normal. No ductal dilatation. No pancreatic inflammation. Spleen: Normal spleen Adrenals/Urinary Tract: Adrenal glands and kidneys are normal. Ureters and bladder normal.  Stomach/Bowel: Stomach, small bowel, appendix and cecum normal. Colon and rectosigmoid colon are normal. Large stool ball in the rectum measuring 8.5 cm. Vascular/Lymphatic: Vascular calcification aorta. Large bore central venous line from a RIGHT femoral approach with tip in the RIGHT atrium. Reproductive: Uterus appears normal.  Large amount of gas in vagina. Other: No free fluid or abscess Musculoskeletal: No aggressive osseous lesion. IMPRESSION: 1. No clear acute findings the abdomen pelvis. No abscess or infection evident. 2. Large stool ball in the rectum. 3. Prominent gas within the vagina. 4. Improvement in bilateral pleural effusions. Electronically Signed   By: Genevive Bi M.D.   On: 08/26/2016 23:53   US Abdomen Limited Ruq  Result Date: 08/26/2016 CLINICAL DATA:  Acute onset of generalized abdominal pain. Nausea. Initial encounter. EXAM: US ABDOMEN LIMITED - RIGHT UPPER QUADRANT COMPARISON:  CT of the abdomen and pelvis performed 07/16/2016 FINDINGS: Gallbladder: Likely mildly echogenic sludge and tiny stones are seen within the gallbladder. The gallbladder is relatively contracted and otherwise unremarkable in appearance. No gallbladder wall thickening or pericholecystic fluid is seen. No ultrasonographic Murphy's sign is elicited. Common bile duct: Diameter: 0.4 cm, within normal limits in caliber. Liver: No focal lesion identified. Within normal limits in parenchymal echogenicity. IMPRESSION: 1. No acute abnormality seen at the right upper quadrant. 2. Likely mildly echogenic sludge and tiny stones within the gallbladder. Gallbladder relatively contracted and otherwise unremarkable. Electronically Signed   By: Roanna Raider M.D.   On: 08/26/2016 22:44    Procedures Procedures (including critical care time)  Medications Ordered in ED Medications  ondansetron (ZOFRAN-ODT) disintegrating tablet 4 mg (4 mg Oral Given 08/26/16 2109)  oxyCODONE (Oxy IR/ROXICODONE) immediate release tablet  5 mg (5 mg Oral Given 08/26/16 2113)     Initial Impression / Assessment and Plan / ED Course  I have reviewed the triage vital signs and the nursing notes.  Pertinent labs & imaging results that were available during my care of the patient were reviewed by me and considered in my medical decision making (see chart for details).  Clinical Course    65 yo F With chronic right upper quadrant  abdominal pain. Will treat the patient's pain and nausea obtain abdominal labs reassessed. Found to have a significant leukocytosis, RUQ [place] negative, will CT.   CT negative, patient with significant stool burden.  D/c home, follow up with PCP, GI  3:07 PM:  I have discussed the diagnosis/risks/treatment options with the patient and family and believe the pt to be eligible for discharge home to follow-up with PCP. We also discussed returning to the ED immediately if new or worsening sx occur. We discussed the sx which are most concerning (e.g., sudden worsening pain, fever, inability to tolerate by mouth) that necessitate immediate return. Medications administered to the patient during their visit and any new prescriptions provided to the patient are listed below.  Medications given during this visit Medications  ondansetron (ZOFRAN-ODT) disintegrating tablet 4 mg (4 mg Oral Given 08/26/16 2109)  oxyCODONE (Oxy IR/ROXICODONE) immediate release tablet 5 mg (5 mg Oral Given 08/26/16 2113)     The patient appears reasonably screen and/or stabilized for discharge and I doubt any other medical condition or other [facility] requiring further screening, evaluation, or treatment in the ED at this time prior to discharge.    Final Clinical Impressions(s) / ED Diagnoses   Final diagnoses:  RUQ abdominal pain    New Prescriptions Discharge Medication List as of 08/26/2016 11:57 PM       Melene Plan, DO 08/27/16 1507

## 2016-08-26 NOTE — ED Notes (Signed)
MD at bedside. 

## 2016-08-26 NOTE — ED Notes (Signed)
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  I advised pt but she refused.  Notified nurse

## 2016-08-26 NOTE — ED Notes (Signed)
Patient transported to CT 

## 2016-08-27 NOTE — ED Notes (Signed)
Pt departed in NAD, in care of [facility] 

## 2016-09-06 ENCOUNTER — Ambulatory Visit (HOSPITAL_COMMUNITY)
Admission: RE | Admit: 2016-09-06 | Discharge: 2016-09-06 | Disposition: A | Discharge status: Home / Self Care | Payer: Medicare PPO | Source: Ambulatory Visit | Attending: Gastroenterology | Admitting: Gastroenterology

## 2016-09-06 DIAGNOSIS — R1011 Right upper quadrant pain: Secondary | ICD-10-CM | POA: Insufficient documentation

## 2016-09-06 DIAGNOSIS — K828 Other specified diseases of gallbladder: Secondary | ICD-10-CM

## 2016-09-06 MED ORDER — TECHNETIUM TC 99M MEBROFENIN IV KIT
5.0000 | PACK | Freq: Once | INTRAVENOUS | Status: AC | PRN
  Administered 2016-09-06: 5 via INTRAVENOUS

## 2016-09-12 ENCOUNTER — Telehealth: Payer: Self-pay | Admitting: *Deleted

## 2016-09-12 NOTE — Telephone Encounter (Signed)
Called patients sister [name] to confirm contact number for patient as [facility] had received call from Amy with [facility].  She needed to confirm that ICD was being checked.  Sister states that patients cell number could be given to [facility].

## 2016-09-16 ENCOUNTER — Encounter (HOSPITAL_COMMUNITY): Payer: Self-pay

## 2016-09-16 ENCOUNTER — Emergency Department (HOSPITAL_COMMUNITY)
Admission: EM | Admit: 2016-09-16 | Discharge: 2016-09-16 | Disposition: A | Discharge status: Home / Self Care | Payer: Medicare PPO | Attending: Emergency Medicine | Admitting: Emergency Medicine

## 2016-09-16 ENCOUNTER — Emergency Department (HOSPITAL_COMMUNITY): Payer: Medicare PPO

## 2016-09-16 DIAGNOSIS — Z95810 Presence of automatic (implantable) cardiac defibrillator: Secondary | ICD-10-CM | POA: Diagnosis not present

## 2016-09-16 DIAGNOSIS — J449 Chronic obstructive pulmonary disease, unspecified: Secondary | ICD-10-CM | POA: Insufficient documentation

## 2016-09-16 DIAGNOSIS — Z87891 Personal history of nicotine dependence: Secondary | ICD-10-CM | POA: Insufficient documentation

## 2016-09-16 DIAGNOSIS — I5042 Chronic combined systolic (congestive) and diastolic (congestive) heart failure: Secondary | ICD-10-CM | POA: Diagnosis not present

## 2016-09-16 DIAGNOSIS — Z794 Long term (current) use of insulin: Secondary | ICD-10-CM | POA: Insufficient documentation

## 2016-09-16 DIAGNOSIS — Z992 Dependence on renal dialysis: Secondary | ICD-10-CM | POA: Insufficient documentation

## 2016-09-16 DIAGNOSIS — N186 End stage renal disease: Secondary | ICD-10-CM | POA: Diagnosis not present

## 2016-09-16 DIAGNOSIS — E1140 Type 2 diabetes mellitus with diabetic neuropathy, unspecified: Secondary | ICD-10-CM | POA: Diagnosis not present

## 2016-09-16 DIAGNOSIS — Z91040 Latex allergy status: Secondary | ICD-10-CM | POA: Diagnosis not present

## 2016-09-16 DIAGNOSIS — E1122 Type 2 diabetes mellitus with diabetic chronic kidney disease: Secondary | ICD-10-CM | POA: Diagnosis not present

## 2016-09-16 DIAGNOSIS — R197 Diarrhea, unspecified: Secondary | ICD-10-CM | POA: Diagnosis present

## 2016-09-16 DIAGNOSIS — I132 Hypertensive heart and chronic kidney disease with heart failure and with stage 5 chronic kidney disease, or end stage renal disease: Secondary | ICD-10-CM | POA: Diagnosis not present

## 2016-09-16 DIAGNOSIS — I2510 Atherosclerotic heart disease of native coronary artery without angina pectoris: Secondary | ICD-10-CM | POA: Insufficient documentation

## 2016-09-16 LAB — COMPREHENSIVE METABOLIC PANEL
ALT: 12 U/L — AB (ref 14–54)
ANION GAP: 11 (ref 5–15)
AST: 17 U/L (ref 15–41)
Albumin: 1.9 g/dL — ABNORMAL LOW (ref 3.5–5.0)
Alkaline Phosphatase: 182 U/L — ABNORMAL HIGH (ref 38–126)
BUN: 47 mg/dL — ABNORMAL HIGH (ref 6–20)
CALCIUM: 8.8 mg/dL — AB (ref 8.9–10.3)
CHLORIDE: 97 mmol/L — AB (ref 101–111)
CO2: 27 mmol/L (ref 22–32)
CREATININE: 4.9 mg/dL — AB (ref 0.44–1.00)
GFR, EST AFRICAN AMERICAN: 10 mL/min — AB (ref >60)
GFR, EST NON AFRICAN AMERICAN: 8 mL/min — AB (ref >60)
Glucose, Bld: 73 mg/dL (ref 65–99)
Potassium: 3.5 mmol/L (ref 3.5–5.1)
Sodium: 135 mmol/L (ref 135–145)
Total Bilirubin: 0.8 mg/dL (ref 0.3–1.2)
Total Protein: 8.7 g/dL — ABNORMAL HIGH (ref 6.5–8.1)

## 2016-09-16 LAB — CBC WITH DIFFERENTIAL/PLATELET
BASOS PCT: 0 %
Basophils Absolute: 0 10*3/uL (ref 0.0–0.1)
EOS ABS: 0.3 10*3/uL (ref 0.0–0.7)
Eosinophils Relative: 2 %
HCT: 32.5 % — ABNORMAL LOW (ref 36.0–46.0)
HEMOGLOBIN: 10 g/dL — AB (ref 12.0–15.0)
LYMPHS ABS: 1.9 10*3/uL (ref 0.7–4.0)
Lymphocytes Relative: 14 %
MCH: 25.1 pg — ABNORMAL LOW (ref 26.0–34.0)
MCHC: 30.8 g/dL (ref 30.0–36.0)
MCV: 81.7 fL (ref 78.0–100.0)
Monocytes Absolute: 1.1 10*3/uL — ABNORMAL HIGH (ref 0.1–1.0)
Monocytes Relative: 8 %
NEUTROS PCT: 76 %
Neutro Abs: 10.1 10*3/uL — ABNORMAL HIGH (ref 1.7–7.7)
Platelets: 342 10*3/uL (ref 150–400)
RBC: 3.98 MIL/uL (ref 3.87–5.11)
RDW: 17.7 % — ABNORMAL HIGH (ref 11.5–15.5)
WBC: 13.3 10*3/uL — AB (ref 4.0–10.5)

## 2016-09-16 MED ORDER — LOPERAMIDE HCL 2 MG PO CAPS: 2.0000 mg | ORAL_CAPSULE | Freq: Four times a day (QID) | ORAL | 0 refills | Status: DC | PRN

## 2016-09-16 MED ORDER — SODIUM CHLORIDE 0.9 % IV SOLN: INTRAVENOUS | Status: DC

## 2016-09-16 MED ORDER — SODIUM CHLORIDE 0.9 % IV BOLUS (SEPSIS)
1000.0000 mL | Freq: Once | INTRAVENOUS | Status: AC
  Administered 2016-09-16: 1000 mL via INTRAVENOUS

## 2016-09-16 NOTE — ED Triage Notes (Signed)
Pt. Coming from dialysis today via [facility] for diarrhea x5 days and nausea. Pt. Hx of C. Difficile. Pt. Lives at [facility]. Pt. Did not receive dialysis treatment today due to her symptoms. 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 Pt. Also c/o abd. Pain and inability to pass gas. Pt. Aox4. EDP at bedside.

## 2016-09-16 NOTE — ED Notes (Signed)
Pt arrived to [facility] via stretcher to wait for [facility]. Pt has on her personal clothing, alert.

## 2016-09-16 NOTE — ED Provider Notes (Signed)
[facility]-EMERGENCY DEPT Provider Note   CSN: [id number] Arrival date & time: 09/16/16  1159     History   Chief Complaint Chief Complaint  Patient presents with  . Diarrhea    HPI Brandy Dudley is a 65 y.o. female.  Pt presents to the ED with diarrhea.  She lives at [facility] and has had diarrhea for the past few days.  She said they took a stool sample to check for c.diff, but she does not know the result.  The pt went for dialysis today, but told dialysis that she did not think she could do dialysis due to the diarrhea.  She was sent here.  I called [name] and the nurse said that her c.diff test was still pending.  She said that pt finished all of her abx (?vancomycin) for the c.diff.  Pt has no new abdominal pain.  She denies f/c or vomiting.      Past Medical History:  Diagnosis Date  . AICD (automatic cardioverter/defibrillator) present    Biotronik Inventra 7 VR-T DX 04/12/15  . Anemia   . Anxiety   . C. difficile colitis   . Cardiomyopathy (HCC)    02/02/16 ([facility]): Mixed ischemic and non-ischemic cardiomyopathy  . CHF (congestive heart failure) (HCC)    systolic  . COPD (chronic obstructive pulmonary disease) (HCC)   . Coronary artery disease   . Diabetes mellitus (HCC)   . DVT (deep venous thrombosis) (HCC)    on coumadin  . ESRD (end stage renal disease) (HCC)    tues/thurs/sat dialysis  . Hypertension   . Neuropathy (HCC)   . Obstructive sleep apnea    no cpap  . Renal disorder   . Wears glasses     Patient Active Problem List   Diagnosis Date Noted  . Protein-calorie malnutrition, severe 07/18/2016  . Positive blood culture 07/17/2016  . Atherosclerotic peripheral vascular disease (HCC) 07/17/2016  . Bacteremia 07/17/2016  . Volume overload 07/15/2016  . Leukocytosis 07/15/2016  . Depression 07/05/2016  . Umbilical hernia without obstruction or gangrene 07/05/2016  . Generalized anxiety disorder 07/05/2016  . Loss of weight 07/05/2016    . Chronic combined systolic and diastolic CHF (congestive heart failure) (HCC) 05/07/2016  . RUQ pain 05/06/2016  . 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 Anemia of chronic renal failure, stage 5 (HCC) 04/14/2016  . ESRD on dialysis (HCC)   . History of Clostridium difficile colitis 03/20/2016  . COPD (chronic obstructive pulmonary disease) (HCC) 03/20/2016  . Obstructive sleep apnea 03/20/2016  . AICD (automatic cardioverter/defibrillator) present 03/20/2016  . Physical deconditioning 03/02/2016  . DVT (deep venous thrombosis), right 03/02/2016  . Cardiomyopathy, ischemic 03/02/2016  . Coronary artery disease involving native coronary artery of native heart without angina pectoris 03/02/2016  . Diabetes mellitus type 2 in obese (HCC) 03/02/2016    Past Surgical History:  Procedure Laterality Date  . AV FISTULA PLACEMENT Left 04/03/2016   Procedure: ARTERIOVENOUS (AV) FISTULA CREATION;  Surgeon: Larina Earthly, MD;  Location: [facility] OR;  Service: Vascular;  Laterality: Left;  . AV FISTULA PLACEMENT Right 08/21/2016   Procedure: INSERTION RIGHT ARM ARTERIOVENOUS GRAFT USING 4-7MM X45  CM ACUSEAL GRAFT;  Surgeon: Maeola Harman, MD;  Location: [facility] OR;  Service: Vascular;  Laterality: Right;  . CORONARY ANGIOPLASTY     LCX stent 2010; by [facility] 01/2016 note: 03/2014 LHC/RHC: minor luminal irregularities LAD and RCA with patent stent first marginal  branch of CX. RA 11, PAP 58/22 with mean 39, mean wedge pressure 19, LVEDP 27, PVR 3.9, CO 5.1, Cardiac index 2.5.   . FISTULA SUPERFICIALIZATION Left 06/16/2016   Procedure: SUPERFICIALIZATION OF LEFT ARM BRACHIOCEPHALIC ARTERIOVENOUS FISTULA;  Surgeon: Chuck Hint, MD;  Location: [facility] OR;  Service: Vascular;  Laterality: Left;  . INSERTION OF DIALYSIS CATHETER Left 04/03/2016   Procedure: INSERTION OF DIALYSIS CATHETER;  Surgeon: Larina Earthly, MD;  Location: [facility] OR;  Service: Vascular;  Laterality:  Left;  . LIGATION OF COMPETING BRANCHES OF ARTERIOVENOUS FISTULA Left 06/16/2016   Procedure: LIGATION OF COMPETING BRANCHES OF left brachiocephalic ARTERIOVENOUS FISTULA;  Surgeon: Chuck Hint, MD;  Location: [facility] OR;  Service: Vascular;  Laterality: Left;  . PERIPHERAL VASCULAR CATHETERIZATION N/A 07/20/2016   Procedure: A/V Shuntogram/Fistulagram;  Surgeon: Fransisco Hertz, MD;  Location: [facility] INVASIVE CV LAB;  Service: Cardiovascular;  Laterality: N/A;  . PERIPHERAL VASCULAR CATHETERIZATION N/A 07/20/2016   Procedure: Dialysis/Perma Catheter Insertion;  Surgeon: Fransisco Hertz, MD;  Location: [facility] INVASIVE CV LAB;  Service: Cardiovascular;  Laterality: N/A;  . REVISION OF ARTERIOVENOUS GORETEX GRAFT Left 06/16/2016   Procedure: REVISION OF LEFT BRACHIOCEPHALIC ARTERIOVENOUS FISTULA WITH RESECTION OF REDUNDANT SECTION;  Surgeon: Chuck Hint, MD;  Location: [facility] OR;  Service: Vascular;  Laterality: Left;    OB History    No data available       Home Medications    Prior to Admission medications   Medication Sig Start Date End Date Taking? Authorizing Provider  acetaminophen (TYLENOL) 325 MG tablet Take 325 mg by mouth every 6 (six) hours as needed for mild pain or fever (For fever >99.5).     Historical Provider, MD  albuterol (PROAIR HFA) 108 (90 Base) MCG/ACT inhaler Inhale 2 puffs into the lungs every 6 (six) hours as needed for wheezing or shortness of breath.     Historical Provider, MD  albuterol (PROVENTIL) (2.5 MG/3ML) 0.083% nebulizer solution Take 2.5 mg by nebulization every 6 (six) hours as needed for wheezing or shortness of breath.    Historical Provider, MD  atorvastatin (LIPITOR) 10 MG tablet Take 10 mg by mouth at bedtime.     Historical Provider, MD  budesonide (PULMICORT) 0.5 MG/2ML nebulizer solution Take 0.5 mg by nebulization 2 (two) times daily.    Historical Provider, MD  carvedilol (COREG) 3.125 MG tablet Take 3.125 mg by mouth 3 (three) times a week. Twice on  Monday, Wednesday and Friday    Historical Provider, MD  famotidine (PEPCID) 20 MG tablet Take 1 tablet (20 mg total) by mouth at bedtime. 07/22/16   Maryann Mikhail, DO  hydrOXYzine (ATARAX/VISTARIL) 50 MG tablet Take 50 mg by mouth every 6 (six) hours as needed for itching.    Historical Provider, MD  insulin glargine (LANTUS) 100 unit/mL SOPN Inject 0.1 mLs (10 Units total) into the skin at bedtime. 05/09/16   Catarina Hartshorn, MD  insulin lispro (HUMALOG) 100 UNIT/ML injection Inject 0-10 Units into the skin 3 (three) times daily as needed for high blood sugar (CBG >180). CBG 0-59 Hypoglycemic Protocol, 60-149 0 units 150-250 5 units, 251-300 8 units, 301-350 10 units, >350 call MD    Historical Provider, MD  loperamide (IMODIUM) 2 MG capsule Take 1 capsule (2 mg total) by mouth 4 (four) times daily as needed for diarrhea or loose stools. 09/16/16   Jacalyn Lefevre, MD  LORazepam (ATIVAN) 0.5 MG tablet Take 1 tablet (0.5 mg total) by mouth 2 (two)  times daily as needed for anxiety. 07/22/16   Maryann Mikhail, DO  Multiple Vitamins-Minerals (CERTAVITE/ANTIOXIDANTS) TABS Take 1 tablet by mouth at bedtime.    Historical Provider, MD  saccharomyces boulardii (FLORASTOR) 250 MG capsule Take 250 mg by mouth 2 (two) times daily.    Historical Provider, MD  vancomycin (VANCOCIN) 125 MG capsule Take 125 mg by mouth 4 (four) times daily.    Historical Provider, MD    Family History History reviewed. No pertinent family history.  Social History Social History  Substance Use Topics  . Smoking status: Former Smoker    Types: Cigarettes    Quit date: 03/03/2006  . Smokeless tobacco: Never Used  . Alcohol use No     Allergies   Entresto [sacubitril-valsartan]; Latex; and Sulfa antibiotics   Review of Systems Review of Systems  Gastrointestinal: Positive for diarrhea.  All other systems reviewed and are negative.    Physical Exam Updated Vital Signs BP 129/76 (BP Location: Left Leg)   Pulse 91   Temp  98.1 F (36.7 C) (Oral)   Resp 17   Ht 5\' 4"  (1.626 m)   Wt 167 lb (75.8 kg)   SpO2 100%   BMI 28.67 kg/m   Physical Exam  Constitutional: She appears well-developed and well-nourished.  HENT:  Head: Normocephalic and atraumatic.  Right Ear: External ear normal.  Left Ear: External ear normal.  Nose: Nose normal.  Mouth/Throat: Oropharynx is clear and moist.  Eyes: Conjunctivae and EOM are normal. Pupils are equal, round, and reactive to light.  Neck: Normal range of motion. Neck supple.  Cardiovascular: Regular rhythm, normal heart sounds and intact distal pulses.  Tachycardia present.   Pulmonary/Chest: Effort normal.  Abdominal: Soft. Bowel sounds are normal.  Musculoskeletal: She exhibits edema.  Maturing avf right arm.  Old one left arm.  Dialysis cath right thigh.  Neurological: She is alert.  Skin: Skin is warm.  Psychiatric: She has a normal mood and affect. Her behavior is normal. Judgment and thought content normal.  Nursing note and vitals reviewed.    ED Treatments / Results  Labs (all labs ordered are listed, but only abnormal results are displayed) Labs Reviewed  COMPREHENSIVE METABOLIC PANEL - Abnormal; Notable for the following:       Result Value   Chloride 97 (*)    BUN 47 (*)    Creatinine, Ser 4.90 (*)    Calcium 8.8 (*)    Total Protein 8.7 (*)    Albumin 1.9 (*)    ALT 12 (*)    Alkaline Phosphatase 182 (*)    GFR calc non Af Amer 8 (*)    GFR calc Af Amer 10 (*)    All other components within normal limits  CBC WITH DIFFERENTIAL/PLATELET - Abnormal; Notable for the following:    WBC 13.3 (*)    Hemoglobin 10.0 (*)    HCT 32.5 (*)    MCH 25.1 (*)    RDW 17.7 (*)    Neutro Abs 10.1 (*)    Monocytes Absolute 1.1 (*)    All other components within normal limits    EKG  EKG Interpretation  Date/Time:  Saturday September 16 2016 13:56:46 EDT Ventricular Rate:  88 PR Interval:    QRS Duration: 116 QT Interval:  386 QTC  Calculation: 467 R Axis:   -35 Text Interpretation:  Sinus rhythm Atrial premature complexes Nonspecific intraventricular conduction delay Low voltage, extremity leads Confirmed by Shalia Bartko MD, Giordan Fordham ([id number]) on 09/16/2016 2:02:10  PM       Radiology Dg Abd Acute W/chest  Result Date: 09/16/2016 CLINICAL DATA:  Right abdominal pain. Patient is on oxygen at nursing home. Patient is on routine dialysis. EXAM: DG ABDOMEN ACUTE W/ 1V CHEST COMPARISON:  Chest radiograph 07/20/2016 FINDINGS: Chest radiographic again demonstrates a left single lead cardiac ICD. There is a right femoral dialysis catheter that terminates near the superior cavoatrial junction. Stable enlargement of the cardiac silhouette. Slightly prominent interstitial lung markings are probably chronic. No evidence for free air. Bowel gas throughout the abdomen with a nonobstructive process. IMPRESSION: No acute abdominal findings. Right femoral dialysis catheter as described. Stable cardiomegaly. Prominent interstitial lung markings may be chronic but cannot exclude vascular congestion. No focal lung disease. Electronically Signed   By: Richarda Overlie M.D.   On: 09/16/2016 13:38    Procedures Procedures (including critical care time)  Medications Ordered in ED Medications  sodium chloride 0.9 % bolus 1,000 mL (1,000 mLs Intravenous New Bag/Given 09/16/16 1452)    And  0.9 %  sodium chloride infusion (not administered)     Initial Impression / Assessment and Plan / ED Course  I have reviewed the triage vital signs and the nursing notes.  Pertinent labs & imaging results that were available during my care of the patient were reviewed by me and considered in my medical decision making (see chart for details).  Clinical Course    Pt has been here for a few hours.  She has not had any diarrhea while she's been here.  She is not in any acute need of dialysis.  She knows to return if worse and to f/u with her pcp.  Final Clinical  Impressions(s) / ED Diagnoses   Final diagnoses:  Diarrhea, unspecified type  ESRD on hemodialysis (HCC)    New Prescriptions New Prescriptions   LOPERAMIDE (IMODIUM) 2 MG CAPSULE    Take 1 capsule (2 mg total) by mouth 4 (four) times daily as needed for diarrhea or loose stools.     Jacalyn Lefevre, MD 09/16/16 [phone]

## 2016-09-16 NOTE — ED Notes (Signed)
Pt on phone. Pt refusing to get off phone to do orthostatic vital signs. Will try again in a few min.

## 2016-09-16 NOTE — ED Notes (Signed)
Patient transported to X-ray 

## 2016-09-16 NOTE — ED Notes (Signed)
RN attempted IV 2x unsuccessfully.  

## 2016-09-16 NOTE — ED Notes (Signed)
RN contacted lab about missing lab results from blood collected ~1hour ago. Lab reports they never received blood. EDP made aware.

## 2016-09-18 ENCOUNTER — Non-Acute Institutional Stay (SKILLED_NURSING_FACILITY): Payer: Medicare PPO | Admitting: Internal Medicine

## 2016-09-18 ENCOUNTER — Encounter: Payer: Self-pay | Admitting: Internal Medicine

## 2016-09-18 DIAGNOSIS — Z221 Carrier of other intestinal infectious diseases: Secondary | ICD-10-CM

## 2016-09-18 NOTE — Progress Notes (Signed)
LOCATION: Facility: [facility]    PCP: [name], MD   Code Status: full code   Goals of care: Advanced Directive information Advanced Directives 09/16/2016  Does patient have an advance directive? No  Type of Advance Directive -  Does patient want to make changes to advanced directive? -  Copy of advanced directive(s) in chart? -  Would patient like information on creating an advanced directive? -       Extended Emergency Contact Information Primary Emergency Contact: Haith,[name] Address: [address]          [place], [place] [address] [place] of [place] Home Phone: [phone] Relation: Sister Secondary Emergency Contact: Marcello Moores, [place] Home Phone: [phone] Relation: Sister   Allergies  Allergen Reactions  . Entresto [Sacubitril-Valsartan] Other (See Comments)     unknown reaction  . Latex Itching  . Sulfa Antibiotics Itching    Chief Complaint  Patient presents with  . Acute Visit    Diarrhea     HPI:  Patient is a 65 y.o. female seen today for acute visit. She had 2 episode of loose stools last week in a day and given her hx of c.diff, c.diff PCR sent on 09/14/16. This resulted positive. she is seen in her room today. She has had one episode of soft stool this Saturday and today. She did not have a bowel movement on Sunday. She mentions getting miralax last week for being constipated. She denies any abdominal pain, nausea or vomiting. No fever reported.    Review of Systems:  Constitutional: Negative for fever, chills HENT: Negative for headache Gastrointestinal: Negative for heartburn, nausea, vomiting, abdominal pain. Had a bowel movement this am that was soft stool per pt. Denies blood in stool.  Genitourinary: Negative for dysuria Skin: Negative for itching, rash.  Neurological: Negative for dizziness.   Past Medical History:  Diagnosis Date  . AICD  (automatic cardioverter/defibrillator) present    Biotronik Inventra 7 VR-T DX 04/12/15  . Anemia   . Anxiety   . C. difficile colitis   . Cardiomyopathy (HCC)    02/02/16 ([facility]): Mixed ischemic and non-ischemic cardiomyopathy  . CHF (congestive heart failure) (HCC)    systolic  . COPD (chronic obstructive pulmonary disease) (HCC)   . Coronary artery disease   . Diabetes mellitus (HCC)   . DVT (deep venous thrombosis) (HCC)    on coumadin  . ESRD (end stage renal disease) (HCC)    tues/thurs/sat dialysis  . Hypertension   . Neuropathy (HCC)   . Obstructive sleep apnea    no cpap  . Renal disorder   . Wears glasses    Past Surgical History:  Procedure Laterality Date  . AV FISTULA PLACEMENT Left 04/03/2016   Procedure: ARTERIOVENOUS (AV) FISTULA CREATION;  Surgeon: Larina Earthly, MD;  Location: [facility] OR;  Service: Vascular;  Laterality: Left;  . AV FISTULA PLACEMENT Right 08/21/2016   Procedure: INSERTION RIGHT ARM ARTERIOVENOUS GRAFT USING 4-7MM X45  CM ACUSEAL GRAFT;  Surgeon: Maeola Harman, MD;  Location: [facility] OR;  Service: Vascular;  Laterality: Right;  . CORONARY ANGIOPLASTY     LCX stent 2010; by [facility] 01/2016 note: 03/2014 LHC/RHC: minor luminal irregularities LAD and RCA with patent stent first marginal branch of CX. RA 11, PAP 58/22 with mean 39, mean wedge pressure 19, LVEDP 27, PVR 3.9, CO 5.1, Cardiac index  2.5.   Marland Kitchen FISTULA SUPERFICIALIZATION Left 06/16/2016   Procedure: SUPERFICIALIZATION OF LEFT ARM BRACHIOCEPHALIC ARTERIOVENOUS FISTULA;  Surgeon: Chuck Hint, MD;  Location: [facility] OR;  Service: Vascular;  Laterality: Left;  . INSERTION OF DIALYSIS CATHETER Left 04/03/2016   Procedure: INSERTION OF DIALYSIS CATHETER;  Surgeon: Larina Earthly, MD;  Location: [facility] OR;  Service: Vascular;  Laterality: Left;  . LIGATION OF COMPETING BRANCHES OF ARTERIOVENOUS FISTULA Left 06/16/2016   Procedure: LIGATION OF COMPETING BRANCHES OF left brachiocephalic  ARTERIOVENOUS FISTULA;  Surgeon: Chuck Hint, MD;  Location: [facility] OR;  Service: Vascular;  Laterality: Left;  . PERIPHERAL VASCULAR CATHETERIZATION N/A 07/20/2016   Procedure: A/V Shuntogram/Fistulagram;  Surgeon: Fransisco Hertz, MD;  Location: [facility] INVASIVE CV LAB;  Service: Cardiovascular;  Laterality: N/A;  . PERIPHERAL VASCULAR CATHETERIZATION N/A 07/20/2016   Procedure: Dialysis/Perma Catheter Insertion;  Surgeon: Fransisco Hertz, MD;  Location: [facility] INVASIVE CV LAB;  Service: Cardiovascular;  Laterality: N/A;  . REVISION OF ARTERIOVENOUS GORETEX GRAFT Left 06/16/2016   Procedure: REVISION OF LEFT BRACHIOCEPHALIC ARTERIOVENOUS FISTULA WITH RESECTION OF REDUNDANT SECTION;  Surgeon: Chuck Hint, MD;  Location: [facility] OR;  Service: Vascular;  Laterality: Left;   Social History:   reports that she quit smoking about 10 years ago. Her smoking use included Cigarettes. She has never used smokeless tobacco. She reports that she does not drink alcohol or use drugs.  No family history on file.  Medications:   Medication List       Accurate as of 09/18/16  1:15 PM. Always use your most recent med list.          acetaminophen 325 MG tablet Commonly known as:  TYLENOL Take 325 mg by mouth every 6 (six) hours as needed for mild pain or fever (For fever >99.5).   atorvastatin 10 MG tablet Commonly known as:  LIPITOR Take 10 mg by mouth at bedtime.   budesonide 0.5 MG/2ML nebulizer solution Commonly known as:  PULMICORT Take 0.5 mg by nebulization 2 (two) times daily.   CERTAVITE/ANTIOXIDANTS Tabs Take 1 tablet by mouth at bedtime.   famotidine 20 MG tablet Commonly known as:  PEPCID Take 1 tablet (20 mg total) by mouth at bedtime.   FIBER CHOICE PO Take 2 tablets by mouth daily as needed.   hydrOXYzine 50 MG tablet Commonly known as:  ATARAX/VISTARIL Take 50 mg by mouth every 6 (six) hours as needed for itching.   insulin glargine 100 unit/mL Sopn Commonly known as:   LANTUS Inject 0.1 mLs (10 Units total) into the skin at bedtime.   insulin lispro 100 UNIT/ML injection Commonly known as:  HUMALOG Inject 0-10 Units into the skin 3 (three) times daily as needed for high blood sugar (CBG >180). CBG 0-59 Hypoglycemic Protocol, 60-149 0 units 150-250 5 units, 251-300 8 units, 301-350 10 units, >350 call MD   loperamide 2 MG capsule Commonly known as:  IMODIUM Take 1 capsule (2 mg total) by mouth 4 (four) times daily as needed for diarrhea or loose stools.   LORazepam 0.5 MG tablet Commonly known as:  ATIVAN Take 1 tablet (0.5 mg total) by mouth 2 (two) times daily as needed for anxiety.   metoprolol succinate 25 MG 24 hr tablet Commonly known as:  TOPROL-XL Take 12.5 mg by mouth daily.   mirtazapine 15 MG tablet Commonly known as:  REMERON Take 15 mg by mouth at bedtime.   oxyCODONE-acetaminophen 5-325 MG tablet Commonly known as:  PERCOCET/ROXICET Take 1-2  tablets by mouth every 6 (six) hours as needed for severe pain.   OXYGEN Inhale 2 L into the lungs as needed.   PROAIR HFA 108 (90 Base) MCG/ACT inhaler Generic drug:  albuterol Inhale 2 puffs into the lungs every 6 (six) hours as needed for wheezing or shortness of breath.   sertraline 25 MG tablet Commonly known as:  ZOLOFT Take 25 mg by mouth daily.       Immunizations: Immunization History  Administered Date(s) Administered  . PPD Test 03/01/2016, 04/10/2016     Physical Exam: Vitals:   09/18/16 1304  BP: 116/60  Pulse: 70  Resp: 18  Temp: 98.9 F (37.2 C)  TempSrc: Oral  SpO2: 97%  Weight: 163 lb (73.9 kg)  Height: 5\' 8"  (1.727 m)   Body mass index is 24.78 kg/m.  General- elderly female, well built, chronically ill appearing Head- normocephalic, atraumatic Neck- no cervical lymphadenopathy Cardiovascular- normal s1,s2, no murmur, no leg edema Respiratory- bilateral clear to auscultation Abdomen- bowel sounds present, soft, no abdominal tenderness on  palpation, umbilical hernia +, no guarding or rigidity Musculoskeletal- able to move all 4 extremities, generalized weakness Neurological- alert and oriented to person, place and time Skin- warm and dry    Labs reviewed: Basic Metabolic Panel:  Recent Labs  76/19/50 1423  05/08/16 0436  07/15/16 1800 07/16/16 0545 07/17/16 1400  07/22/16 1109 07/26/16  08/21/16 0928 08/26/16 2046 09/16/16 1232  NA  --   < > 137  < > 135 135 136  < > 135 140  < > 141 137 135  K  --   < > 3.5  < > 4.3 4.0 4.1  < > 3.5 4.0  < > 3.9 3.6 3.5  CL  --   < > 103  < > 97* 97* 100*  < > 102  --   --   --  100* 97*  CO2  --   < > 25  < > 25 26 29   < > 24  --   --   --  26 27  GLUCOSE  --   < > 76  < > 152* 230* 124*  < > 64*  --   < > 106* 118* 73  BUN  --   < > 28*  < > 40* 26* 40*  < > 12 29*  --   --  14 47*  CREATININE  --   < > 6.52*  < > 6.36* 4.62* 5.64*  < > 2.74* 4.2*  --   --  3.02* 4.90*  CALCIUM  --   < > 8.0*  < > 8.5* 8.6* 8.6*  < > 8.2*  --   --   --  8.1* 8.8*  MG 2.1  --  1.9  --  2.2  --   --   --   --   --   --   --   --   --   PHOS  --   < >  --   < > 4.7* 4.3 4.3  --  1.7*  --   --   --   --   --   < > = values in this interval not displayed. Liver Function Tests:  Recent Labs  05/06/16 1240  07/22/16 1109 07/26/16 08/26/16 2046 09/16/16 1232  AST 18  < >  --  17 25 17   ALT 11*  < >  --  12 12* 12*  ALKPHOS 180*  < >  --  179* 194* 182*  BILITOT 1.5*  --   --   --  0.7 0.8  PROT 7.3  --   --   --  8.0 8.7*  ALBUMIN 2.4*  < > 2.0*  --  1.8* 1.9*  < > = values in this interval not displayed.  Recent Labs  03/04/16 0000 05/06/16 1240 08/26/16 2046  LIPASE 44 37 22   No results for input(s): AMMONIA in the last 8760 hours. CBC:  Recent Labs  07/15/16 1331  07/22/16 1109 07/26/16  08/21/16 0928 08/26/16 2046 09/16/16 1232  WBC 13.3*  < > 11.5* 9.7  --   --  19.4* 13.3*  NEUTROABS 11.0*  --   --   --   --   --  16.4* 10.1*  HGB 10.2*  < > 10.4* 10.1*  < > 11.6*  9.2* 10.0*  HCT 31.4*  < > 32.8* 32*  < > 34.0* 30.5* 32.5*  MCV 78.1  < > 79.0  --   --   --  83.3 81.7  PLT 333  < > 90* 172  --   --  258 342  < > = values in this interval not displayed. Cardiac Enzymes: No results for input(s): CKTOTAL, CKMB, CKMBINDEX, TROPONINI in the last 8760 hours. BNP: Invalid input(s): POCBNP CBG:  Recent Labs  07/27/16 1112 08/21/16 0833 08/21/16 1219  GLUCAP 115* 102* 93    Radiological Exams: Ct Abdomen Pelvis Wo Contrast  Result Date: 08/26/2016 CLINICAL DATA:  Abdominal pain.  History of colitis. EXAM: CT ABDOMEN AND PELVIS WITHOUT CONTRAST TECHNIQUE: Multidetector CT imaging of the abdomen and pelvis was performed following the standard protocol without IV contrast. COMPARISON:  CT 08/ 20/2017 FINDINGS: Lower chest: Improved effusions at the lung bases. Linear atelectasis remains. Heart is enlarged. Hepatobiliary: No focal hepatic lesion on noncontrast exam. Pancreas: Pancreas is normal. No ductal dilatation. No pancreatic inflammation. Spleen: Normal spleen Adrenals/Urinary Tract: Adrenal glands and kidneys are normal. Ureters and bladder normal. Stomach/Bowel: Stomach, small bowel, appendix and cecum normal. Colon and rectosigmoid colon are normal. Large stool ball in the rectum measuring 8.5 cm. Vascular/Lymphatic: Vascular calcification aorta. Large bore central venous line from a RIGHT femoral approach with tip in the RIGHT atrium. Reproductive: Uterus appears normal.  Large amount of gas in vagina. Other: No free fluid or abscess Musculoskeletal: No aggressive osseous lesion. IMPRESSION: 1. No clear acute findings the abdomen pelvis. No abscess or infection evident. 2. Large stool ball in the rectum. 3. Prominent gas within the vagina. 4. Improvement in bilateral pleural effusions. Electronically Signed   By: Genevive BiStewart  Edmunds M.D.   On: 08/26/2016 23:53   Nm Hepato W/eject Fract  Addendum Date: 09/08/2016   ADDENDUM REPORT: 09/08/2016 12:34  ADDENDUM: Patient was asymptomatic after drinking Ensure. Electronically Signed   By: Marnee SpringJonathon  Watts M.D.   On: 09/08/2016 12:34   Result Date: 09/08/2016 CLINICAL DATA:  Right upper quadrant pain and nausea for 1 month EXAM: NUCLEAR MEDICINE HEPATOBILIARY IMAGING WITH GALLBLADDER EF TECHNIQUE: Sequential images of the abdomen were obtained out to 60 minutes following intravenous administration of radiopharmaceutical. After oral ingestion of Ensure, gallbladder ejection fraction was determined. At 60 min, normal ejection fraction is greater than 33%. RADIOPHARMACEUTICALS:  Five mCi [id number]  Choletec IV COMPARISON:  Sonography 08/26/2016 FINDINGS: Prompt uptake and biliary excretion of activity by the liver is seen. Gallbladder activity is visualized, consistent with patency of cystic duct. Biliary activity passes into small bowel, consistent with patent common  bile duct. Calculated gallbladder ejection fraction is 62%, which was achieved at approximately 20 minutes. Thereafter, the gallbladder began to refill (no motion artifact on source emptying images)%. (Normal gallbladder ejection fraction with Ensure is greater than 33%.) IMPRESSION: Normal scan, including ejection fraction of 62%. Electronically Signed: By: Marnee Spring M.D. On: 09/06/2016 14:14   Dg Abd Acute W/chest  Result Date: 09/16/2016 CLINICAL DATA:  Right abdominal pain. Patient is on oxygen at nursing home. Patient is on routine dialysis. EXAM: DG ABDOMEN ACUTE W/ 1V CHEST COMPARISON:  Chest radiograph 07/20/2016 FINDINGS: Chest radiographic again demonstrates a left single lead cardiac ICD. There is a right femoral dialysis catheter that terminates near the superior cavoatrial junction. Stable enlargement of the cardiac silhouette. Slightly prominent interstitial lung markings are probably chronic. No evidence for free air. Bowel gas throughout the abdomen with a nonobstructive process. IMPRESSION: No acute abdominal findings. Right  femoral dialysis catheter as described. Stable cardiomegaly. Prominent interstitial lung markings may be chronic but cannot exclude vascular congestion. No focal lung disease. Electronically Signed   By: Richarda Overlie M.D.   On: 09/16/2016 13:38   US Abdomen Limited Ruq  Result Date: 08/26/2016 CLINICAL DATA:  Acute onset of generalized abdominal pain. Nausea. Initial encounter. EXAM: US ABDOMEN LIMITED - RIGHT UPPER QUADRANT COMPARISON:  CT of the abdomen and pelvis performed 07/16/2016 FINDINGS: Gallbladder: Likely mildly echogenic sludge and tiny stones are seen within the gallbladder. The gallbladder is relatively contracted and otherwise unremarkable in appearance. No gallbladder wall thickening or pericholecystic fluid is seen. No ultrasonographic Murphy's sign is elicited. Common bile duct: Diameter: 0.4 cm, within normal limits in caliber. Liver: No focal lesion identified. Within normal limits in parenchymal echogenicity. IMPRESSION: 1. No acute abnormality seen at the right upper quadrant. 2. Likely mildly echogenic sludge and tiny stones within the gallbladder. Gallbladder relatively contracted and otherwise unremarkable. Electronically Signed   By: Roanna Raider M.D.   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 Patient denies any fever, abdominal pain, nausea and vomiting. She had loose stool x 1 on Saturday, none yesterday and soft stool this am. No blood in stool. Will monitor her clinically for now. with her being asymptomatic, check cbc with diff to rule out leukocytosis. If has clinical symptom will treat her with antibiotic. Monitor for now. Will send repeat c.diff pcr if has > 3 loose stool in 24 hrs or has symptom of abdominal pain, nausea, vomiting and/ or fever.   Family/ staff Communication: reviewed care plan with patient and nursing supervisor    [name], MD Internal Medicine [facility] [address] [place], [place] [address] Cell Phone (Monday-Friday 8 am - 5 pm): [phone] On Call: [phone] and follow prompts after 5 pm and on weekends Office Phone: [phone] Office Fax: [phone]

## 2016-09-20 LAB — CBC AND DIFFERENTIAL
HEMATOCRIT: 31 % — AB (ref 36–46)
Hemoglobin: 9.5 g/dL — AB (ref 12.0–16.0)
NEUTROS ABS: 8 /uL
PLATELETS: 284 10*3/uL (ref 150–399)
WBC: 11.5 10*3/mL

## 2016-09-21 ENCOUNTER — Non-Acute Institutional Stay (SKILLED_NURSING_FACILITY): Payer: Medicare PPO | Admitting: Family

## 2016-09-21 DIAGNOSIS — F411 Generalized anxiety disorder: Secondary | ICD-10-CM | POA: Diagnosis not present

## 2016-09-21 DIAGNOSIS — E1122 Type 2 diabetes mellitus with diabetic chronic kidney disease: Secondary | ICD-10-CM | POA: Diagnosis not present

## 2016-09-21 DIAGNOSIS — R634 Abnormal weight loss: Secondary | ICD-10-CM | POA: Diagnosis not present

## 2016-09-21 DIAGNOSIS — J449 Chronic obstructive pulmonary disease, unspecified: Secondary | ICD-10-CM | POA: Diagnosis not present

## 2016-09-21 DIAGNOSIS — I5042 Chronic combined systolic (congestive) and diastolic (congestive) heart failure: Secondary | ICD-10-CM | POA: Diagnosis not present

## 2016-09-21 DIAGNOSIS — N186 End stage renal disease: Secondary | ICD-10-CM | POA: Diagnosis not present

## 2016-09-21 DIAGNOSIS — F329 Major depressive disorder, single episode, unspecified: Secondary | ICD-10-CM

## 2016-09-21 DIAGNOSIS — F32A Depression, unspecified: Secondary | ICD-10-CM

## 2016-09-21 NOTE — Progress Notes (Signed)
Location:  [facility] Room Number: [phone]  Place of Service:  SNF (31) Provider:  Swayzee Wadley FNP-C   [name], MD  Patient Care Team: [name], MD as PCP - General (Internal Medicine) Laurey Morale, MD as Consulting Physician (Cardiology) Bobbye Riggs, MD (Specialist)  Extended Emergency Contact Information Primary Emergency Contact: Haith,[name] Address: [address]          [place], [place] [address] [place] Home Phone: [phone] Relation: Sister Secondary Emergency Contact: Marcello Moores, [place] Home Phone: [phone] Relation: Sister  Code Status: Full code  Goals of care: Advanced Directive information Advanced Directives 09/16/2016  Does patient have an advance directive? No  Type of Advance Directive -  Does patient want to make changes to advanced directive? -  Copy of advanced directive(s) in chart? -  Would patient like information on creating an advanced directive? -     Chief Complaint  Patient presents with  . Medical Management of Chronic Issues    HPI:  Pt is a 65 y.o. female seen today at [facility] for medical management of chronic diseases. She has a medical history of  COPD, Type 2 DM, CAD, DVT, Depression, ESRD on hemodialysis among other conditions. She is seen her room today. She denies any acute issues this visit. Facility nurse reports patient refuses her lantus at times. Patient states afraid her CBG's will drop at night used to take Lantus in the morning at home. Her weight is stable since one month ago continues to refuses protein supplements.    Past Medical History:  Diagnosis Date  . AICD (automatic cardioverter/defibrillator) present    Biotronik Inventra 7 VR-T DX 04/12/15  . Anemia   . Anxiety   . C. difficile colitis   . Cardiomyopathy (HCC)    02/02/16 ([facility]): Mixed ischemic and non-ischemic  cardiomyopathy  . CHF (congestive heart failure) (HCC)    systolic  . COPD (chronic obstructive pulmonary disease) (HCC)   . Coronary artery disease   . Diabetes mellitus (HCC)   . DVT (deep venous thrombosis) (HCC)    on coumadin  . ESRD (end stage renal disease) (HCC)    tues/thurs/sat dialysis  . Hypertension   . Neuropathy (HCC)   . Obstructive sleep apnea    no cpap  . Renal disorder   . Wears glasses    Past Surgical History:  Procedure Laterality Date  . AV FISTULA PLACEMENT Left 04/03/2016   Procedure: ARTERIOVENOUS (AV) FISTULA CREATION;  Surgeon: Larina Earthly, MD;  Location: [facility] OR;  Service: Vascular;  Laterality: Left;  . AV FISTULA PLACEMENT Right 08/21/2016   Procedure: INSERTION RIGHT ARM ARTERIOVENOUS GRAFT USING 4-7MM X45  CM ACUSEAL GRAFT;  Surgeon: Maeola Harman, MD;  Location: [facility] OR;  Service: Vascular;  Laterality: Right;  . CORONARY ANGIOPLASTY     LCX stent 2010; by [facility] 01/2016 note: 03/2014 LHC/RHC: minor luminal irregularities LAD and RCA with patent stent first marginal branch of CX. RA 11, PAP 58/22 with mean 39, mean wedge pressure 19, LVEDP 27, PVR 3.9, CO 5.1, Cardiac index 2.5.   . FISTULA SUPERFICIALIZATION Left 06/16/2016   Procedure: SUPERFICIALIZATION OF LEFT ARM BRACHIOCEPHALIC ARTERIOVENOUS FISTULA;  Surgeon: Chuck Hint, MD;  Location: [facility] OR;  Service: Vascular;  Laterality: Left;  . INSERTION OF DIALYSIS CATHETER Left 04/03/2016  Procedure: INSERTION OF DIALYSIS CATHETER;  Surgeon: Larina Earthlyodd F Early, MD;  Location: [facility] OR;  Service: Vascular;  Laterality: Left;  . LIGATION OF COMPETING BRANCHES OF ARTERIOVENOUS FISTULA Left 06/16/2016   Procedure: LIGATION OF COMPETING BRANCHES OF left brachiocephalic ARTERIOVENOUS FISTULA;  Surgeon: Chuck Hinthristopher S Dickson, MD;  [facility];  Service: Vascular;  Laterality: Left;  . PERIPHERAL VASCULAR CATHETERIZATION N/A 07/20/2016   Procedure: A/V Shuntogram/Fistulagram;  Surgeon: Fransisco HertzBrian L  Chen, MD;  [facility];  Service: Cardiovascular;  Laterality: N/A;  . PERIPHERAL VASCULAR CATHETERIZATION N/A 07/20/2016   Procedure: Dialysis/Perma Catheter Insertion;  Surgeon: Fransisco HertzBrian L Chen, MD;  Location: [facility] INVASIVE CV LAB;  Service: Cardiovascular;  Laterality: N/A;  . REVISION OF ARTERIOVENOUS GORETEX GRAFT Left 06/16/2016   Procedure: REVISION OF LEFT BRACHIOCEPHALIC ARTERIOVENOUS FISTULA WITH RESECTION OF REDUNDANT SECTION;  Surgeon: Chuck Hinthristopher S Dickson, MD;  [facility];  Service: Vascular;  Laterality: Left;    Allergies  Allergen Reactions  . Entresto [Sacubitril-Valsartan] Other (See Comments)     unknown reaction  . Latex Itching  . Sulfa Antibiotics Itching      Medication List       Accurate as of 09/21/16  2:25 PM. Always use your most recent med list.          acetaminophen 325 MG tablet Commonly known as:  TYLENOL Take 325 mg by mouth every 6 (six) hours as needed for mild pain or fever (For fever >99.5).   atorvastatin 10 MG tablet Commonly known as:  LIPITOR Take 10 mg by mouth at bedtime.   budesonide 0.5 MG/2ML nebulizer solution Commonly known as:  PULMICORT Take 0.5 mg by nebulization 2 (two) times daily.   CERTAVITE/ANTIOXIDANTS Tabs Take 1 tablet by mouth at bedtime.   famotidine 20 MG tablet Commonly known as:  PEPCID Take 1 tablet (20 mg total) by mouth at bedtime.   FIBER CHOICE PO Take 2 tablets by mouth daily as needed.   hydrOXYzine 50 MG tablet Commonly known as:  ATARAX/VISTARIL Take 50 mg by mouth every 6 (six) hours as needed for itching.   insulin glargine 100 unit/mL Sopn Commonly known as:  LANTUS Inject 0.1 mLs (10 Units total) into the skin at bedtime.   insulin lispro 100 UNIT/ML injection Commonly known as:  HUMALOG Inject 0-10 Units into the skin 3 (three) times daily as needed for high blood sugar (CBG >180). CBG 0-59 Hypoglycemic Protocol, 60-149 0 units 150-250 5 units, 251-300 8 units,  301-350 10 units, >350 call MD   loperamide 2 MG capsule Commonly known as:  IMODIUM Take 1 capsule (2 mg total) by mouth 4 (four) times daily as needed for diarrhea or loose stools.   LORazepam 0.5 MG tablet Commonly known as:  ATIVAN Take 1 tablet (0.5 mg total) by mouth 2 (two) times daily as needed for anxiety.   metoprolol succinate 25 MG 24 hr tablet Commonly known as:  TOPROL-XL Take 12.5 mg by mouth daily.   mirtazapine 15 MG tablet Commonly known as:  REMERON Take 15 mg by mouth at bedtime.   oxyCODONE-acetaminophen 5-325 MG tablet Commonly known as:  PERCOCET/ROXICET Take 1-2 tablets by mouth every 6 (six) hours as needed for severe pain.   OXYGEN Inhale 2 L into the lungs as needed.   PROAIR HFA 108 (90 Base) MCG/ACT inhaler Generic drug:  albuterol Inhale 2 puffs into the lungs every 6 (six) hours as needed for wheezing or shortness of breath.   sertraline  25 MG tablet Commonly known as:  ZOLOFT Take 25 mg by mouth daily.       Review of Systems  Constitutional: Negative for activity change, appetite change, chills, fatigue and fever.  HENT: Negative for congestion, rhinorrhea, sinus pressure, sneezing and sore throat.   Eyes: Negative.   Respiratory: Negative for cough, chest tightness, shortness of breath and wheezing.   Cardiovascular: Negative for chest pain and palpitations.  Gastrointestinal: Negative for abdominal distention, abdominal pain, constipation, diarrhea, nausea and vomiting.       Abdominal Hernia.   Endocrine: Negative.   Genitourinary: Negative for dysuria, frequency and urgency.  Musculoskeletal: Positive for gait problem.  Skin: Negative for color change, pallor and rash.          Neurological: Negative for dizziness, seizures, syncope, light-headedness, numbness and headaches.  Hematological: Does not bruise/bleed easily.  Psychiatric/Behavioral: Negative for agitation, confusion, hallucinations, sleep disturbance and suicidal  ideas. The patient is not nervous/anxious.     Immunization History  Administered Date(s) Administered  . PPD Test 03/01/2016, 04/10/2016   Pertinent  Health Maintenance Due  Topic Date Due  . INFLUENZA VACCINE  06/27/2016  . FOOT EXAM  11/27/2016 (Originally 07/23/1961)  . MAMMOGRAM  11/27/2016 (Originally 07/23/2000)  . OPHTHALMOLOGY EXAM  11/27/2016 (Originally 07/23/1961)  . URINE MICROALBUMIN  11/27/2016 (Originally 07/23/1961)  . DEXA SCAN  11/27/2016 (Originally 07/24/2015)  . COLONOSCOPY  11/27/2016 (Originally 07/23/2000)  . PNA vac Low Risk Adult (1 of 2 - PCV13) 11/27/2016 (Originally 07/24/2015)  . HEMOGLOBIN A1C  01/24/2017      Vitals:   09/21/16 1015  BP: 130/70  Pulse: 87  Resp: 18  Temp: 98.3 F (36.8 C)  SpO2: 98%  Weight: 163 lb (73.9 kg)  Height: 5\' 8"  (1.727 m)   Body mass index is 24.78 kg/m. Physical Exam  Constitutional: She is oriented to person, place, and time. She appears well-developed and well-nourished. No distress.  HENT:  Head: Normocephalic.  Mouth/Throat: Oropharynx is clear and moist. No oropharyngeal exudate.  Eyes: Conjunctivae and EOM are normal. Pupils are equal, round, and reactive to light. Right eye exhibits no discharge. Left eye exhibits no discharge. No scleral icterus.  Neck: Normal range of motion. No JVD present. No thyromegaly present.  Cardiovascular: Normal rate, regular rhythm, normal heart sounds and intact distal pulses.  Exam reveals no gallop and no friction rub.   No murmur heard. Pulmonary/Chest: Effort normal and breath sounds normal. No respiratory distress. She has no wheezes. She has no rales.  Abdominal: Soft. Bowel sounds are normal. She exhibits no distension. There is no rebound and no guarding.  Umbilical Hernia reducible.   Genitourinary:  Genitourinary Comments: On Hemo dialysis x 3 per week   Musculoskeletal: She exhibits no edema, tenderness or deformity.  Unsteady gait uses wheelchair.     Lymphadenopathy:    She has no cervical adenopathy.  Neurological: She is oriented to person, place, and time.  Skin: Skin is warm and dry. No rash noted. No erythema. No pallor.  AV fistula  Psychiatric: She has a normal mood and affect.    Labs reviewed:  Recent Labs  04/03/16 1423  05/08/16 0436  07/15/16 1800 07/16/16 0545 07/17/16 1400  07/22/16 1109 07/26/16  08/21/16 0928 08/26/16 2046 09/16/16 1232  NA  --   < > 137  < > 135 135 136  < > 135 140  < > 141 137 135  K  --   < > 3.5  < >  4.3 4.0 4.1  < > 3.5 4.0  < > 3.9 3.6 3.5  CL  --   < > 103  < > 97* 97* 100*  < > 102  --   --   --  100* 97*  CO2  --   < > 25  < > 25 26 29   < > 24  --   --   --  26 27  GLUCOSE  --   < > 76  < > 152* 230* 124*  < > 64*  --   < > 106* 118* 73  BUN  --   < > 28*  < > 40* 26* 40*  < > 12 29*  --   --  14 47*  CREATININE  --   < > 6.52*  < > 6.36* 4.62* 5.64*  < > 2.74* 4.2*  --   --  3.02* 4.90*  CALCIUM  --   < > 8.0*  < > 8.5* 8.6* 8.6*  < > 8.2*  --   --   --  8.1* 8.8*  MG 2.1  --  1.9  --  2.2  --   --   --   --   --   --   --   --   --   PHOS  --   < >  --   < > 4.7* 4.3 4.3  --  1.7*  --   --   --   --   --   < > = values in this interval not displayed.  Recent Labs  05/06/16 1240  07/22/16 1109 07/26/16 08/26/16 2046 09/16/16 1232  AST 18  < >  --  17 25 17   ALT 11*  < >  --  12 12* 12*  ALKPHOS 180*  < >  --  179* 194* 182*  BILITOT 1.5*  --   --   --  0.7 0.8  PROT 7.3  --   --   --  8.0 8.7*  ALBUMIN 2.4*  < > 2.0*  --  1.8* 1.9*  < > = values in this interval not displayed.  Recent Labs  07/15/16 1331  07/22/16 1109 07/26/16  08/21/16 0928 08/26/16 2046 09/16/16 1232  WBC 13.3*  < > 11.5* 9.7  --   --  19.4* 13.3*  NEUTROABS 11.0*  --   --   --   --   --  16.4* 10.1*  HGB 10.2*  < > 10.4* 10.1*  < > 11.6* 9.2* 10.0*  HCT 31.4*  < > 32.8* 32*  < > 34.0* 30.5* 32.5*  MCV 78.1  < > 79.0  --   --   --  83.3 81.7  PLT 333  < > 90* 172  --   --  258 342  < > =  values in this interval not displayed. Lab Results  Component Value Date   TSH 1.993 03/22/2016   Lab Results  Component Value Date   HGBA1C 5.9 07/26/2016   Lab Results  Component Value Date   CHOL 49 03/17/2016   HDL 14 (A) 03/17/2016   LDLCALC 21 03/17/2016   TRIG 68 03/17/2016   Assessment/Plan 1. 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 Refuses Lantus at bedtime at times.Continue on Humalog per SSI. Change Lantus to 8 units SQ in the morning.   2. Chronic obstructive pulmonary  disease, unspecified COPD type (HCC) Stable. Continue on Pulmicort and ProAir. Continue to monitor.   3. Chronic combined systolic and diastolic CHF (congestive heart failure) (HCC) Stable. No weight gain. Exam findings negative for shortness of breath, rales or wheezes. Continue to monitor weight.   4. Depression, unspecified depression type Stable. Continue on Remeron.   5. Generalized anxiety disorder Continue on Lorazepam.   6. Loss of weight Has had 39 pounds weight loss over the last two months though weight stable since prior visit one month ago.she continues to follow up with RD though she refused to take protein supplements. She states appetite has improved.continue to encourage oral intake. Monitor weight.      Family/ staff Communication: Reviewed plan of care with patient and facility Nurse supervisor.   Labs/tests ordered: None

## 2016-09-23 ENCOUNTER — Emergency Department (HOSPITAL_COMMUNITY)
Admission: EM | Admit: 2016-09-23 | Discharge: 2016-09-23 | Disposition: A | Discharge status: Home / Self Care | Payer: Medicare PPO | Attending: Emergency Medicine | Admitting: Emergency Medicine

## 2016-09-23 ENCOUNTER — Encounter (HOSPITAL_COMMUNITY): Payer: Self-pay | Admitting: Neurology

## 2016-09-23 DIAGNOSIS — Z87891 Personal history of nicotine dependence: Secondary | ICD-10-CM | POA: Diagnosis not present

## 2016-09-23 DIAGNOSIS — I5042 Chronic combined systolic (congestive) and diastolic (congestive) heart failure: Secondary | ICD-10-CM | POA: Diagnosis not present

## 2016-09-23 DIAGNOSIS — Z91040 Latex allergy status: Secondary | ICD-10-CM | POA: Insufficient documentation

## 2016-09-23 DIAGNOSIS — Z955 Presence of coronary angioplasty implant and graft: Secondary | ICD-10-CM | POA: Insufficient documentation

## 2016-09-23 DIAGNOSIS — Z992 Dependence on renal dialysis: Secondary | ICD-10-CM | POA: Insufficient documentation

## 2016-09-23 DIAGNOSIS — Z95810 Presence of automatic (implantable) cardiac defibrillator: Secondary | ICD-10-CM | POA: Insufficient documentation

## 2016-09-23 DIAGNOSIS — I132 Hypertensive heart and chronic kidney disease with heart failure and with stage 5 chronic kidney disease, or end stage renal disease: Secondary | ICD-10-CM | POA: Diagnosis not present

## 2016-09-23 DIAGNOSIS — E1122 Type 2 diabetes mellitus with diabetic chronic kidney disease: Secondary | ICD-10-CM | POA: Insufficient documentation

## 2016-09-23 DIAGNOSIS — E860 Dehydration: Secondary | ICD-10-CM | POA: Insufficient documentation

## 2016-09-23 DIAGNOSIS — N186 End stage renal disease: Secondary | ICD-10-CM | POA: Diagnosis not present

## 2016-09-23 DIAGNOSIS — I2510 Atherosclerotic heart disease of native coronary artery without angina pectoris: Secondary | ICD-10-CM | POA: Diagnosis not present

## 2016-09-23 DIAGNOSIS — E1140 Type 2 diabetes mellitus with diabetic neuropathy, unspecified: Secondary | ICD-10-CM | POA: Insufficient documentation

## 2016-09-23 DIAGNOSIS — J449 Chronic obstructive pulmonary disease, unspecified: Secondary | ICD-10-CM | POA: Diagnosis not present

## 2016-09-23 DIAGNOSIS — R42 Dizziness and giddiness: Secondary | ICD-10-CM

## 2016-09-23 DIAGNOSIS — Z794 Long term (current) use of insulin: Secondary | ICD-10-CM | POA: Insufficient documentation

## 2016-09-23 LAB — BASIC METABOLIC PANEL
ANION GAP: 12 (ref 5–15)
BUN: 13 mg/dL (ref 6–20)
CALCIUM: 8.1 mg/dL — AB (ref 8.9–10.3)
CO2: 25 mmol/L (ref 22–32)
CREATININE: 2.03 mg/dL — AB (ref 0.44–1.00)
Chloride: 98 mmol/L — ABNORMAL LOW (ref 101–111)
GFR, EST AFRICAN AMERICAN: 28 mL/min — AB (ref >60)
GFR, EST NON AFRICAN AMERICAN: 24 mL/min — AB (ref >60)
Glucose, Bld: 89 mg/dL (ref 65–99)
Potassium: 3 mmol/L — ABNORMAL LOW (ref 3.5–5.1)
SODIUM: 135 mmol/L (ref 135–145)

## 2016-09-23 LAB — CBC WITH DIFFERENTIAL/PLATELET
BASOS ABS: 0.1 10*3/uL (ref 0.0–0.1)
Basophils Relative: 1 %
EOS ABS: 0.5 10*3/uL (ref 0.0–0.7)
Eosinophils Relative: 6 %
HCT: 32.7 % — ABNORMAL LOW (ref 36.0–46.0)
Hemoglobin: 9.9 g/dL — ABNORMAL LOW (ref 12.0–15.0)
LYMPHS ABS: 1.6 10*3/uL (ref 0.7–4.0)
Lymphocytes Relative: 18 %
MCH: 25.2 pg — ABNORMAL LOW (ref 26.0–34.0)
MCHC: 30.3 g/dL (ref 30.0–36.0)
MCV: 83.2 fL (ref 78.0–100.0)
MONO ABS: 0.7 10*3/uL (ref 0.1–1.0)
MONOS PCT: 8 %
NEUTROS ABS: 5.9 10*3/uL (ref 1.7–7.7)
Neutrophils Relative %: 67 %
PLATELETS: ADEQUATE 10*3/uL (ref 150–400)
RBC: 3.93 MIL/uL (ref 3.87–5.11)
RDW: 17.3 % — AB (ref 11.5–15.5)
WBC: 8.8 10*3/uL (ref 4.0–10.5)

## 2016-09-23 MED ORDER — SODIUM CHLORIDE 0.9 % IV BOLUS (SEPSIS)
500.0000 mL | Freq: Once | INTRAVENOUS | Status: AC
  Administered 2016-09-23: 500 mL via INTRAVENOUS

## 2016-09-23 MED ORDER — ACETAMINOPHEN 325 MG PO TABS
650.0000 mg | ORAL_TABLET | Freq: Once | ORAL | Status: AC
  Administered 2016-09-23: 650 mg via ORAL
  Filled 2016-09-23: qty 2

## 2016-09-23 NOTE — ED Notes (Signed)
[facility] notified by secretary for transport.  

## 2016-09-23 NOTE — ED Notes (Signed)
[place] sandwich , apple sauce and juice given to pt.

## 2016-09-23 NOTE — ED Provider Notes (Signed)
[facility]-EMERGENCY DEPT Provider Note   CSN: [id number] Arrival date & time: 09/23/16  1705     History   Chief Complaint Chief Complaint  Patient presents with  . Dizziness    HPI Brandy Dudley is a 65 y.o. female.  Patient presents after a near-syncopal episode at dialysis. She finished her session and went to get into her wheelchair, then reported near-syncope. Felt lightheaded following this. Denies any head injury. She states "I think they took off too much fluid". Feels asymptomatic now. Denies chest pain, dyspnea, recent fevers, shortness of breath, vertigo, weakness or numbness.   The history is provided by the patient. No language interpreter was used.  Loss of Consciousness   This is a new problem. The current episode started less than 1 hour ago. The problem occurs rarely. The problem has been resolved. There was no loss of consciousness. The problem is associated with standing up. Associated symptoms include dizziness and light-headedness. Pertinent negatives include abdominal pain, bladder incontinence, chest pain, diaphoresis, nausea, palpitations, seizures, vertigo and weakness. She has tried bed rest for the symptoms. The treatment provided significant relief. Past medical history comments: ESRD on dialysis.    Past Medical History:  Diagnosis Date  . AICD (automatic cardioverter/defibrillator) present    Biotronik Inventra 7 VR-T DX 04/12/15  . Anemia   . Anxiety   . C. difficile colitis   . Cardiomyopathy (HCC)    02/02/16 ([facility]): Mixed ischemic and non-ischemic cardiomyopathy  . CHF (congestive heart failure) (HCC)    systolic  . COPD (chronic obstructive pulmonary disease) (HCC)   . Coronary artery disease   . Diabetes mellitus (HCC)   . DVT (deep venous thrombosis) (HCC)    on coumadin  . ESRD (end stage renal disease) (HCC)    tues/thurs/sat dialysis  . Hypertension   . Neuropathy (HCC)   . Obstructive sleep apnea    no cpap  . Renal disorder   .  Wears glasses     Patient Active Problem List   Diagnosis Date Noted  . Protein-calorie malnutrition, severe 07/18/2016  . Positive blood culture 07/17/2016  . Atherosclerotic peripheral vascular disease (HCC) 07/17/2016  . Bacteremia 07/17/2016  . Depression 07/05/2016  . Umbilical hernia without obstruction or gangrene 07/05/2016  . Generalized anxiety disorder 07/05/2016  . Loss of weight 07/05/2016  . Chronic combined systolic and diastolic CHF (congestive heart failure) (HCC) 05/07/2016  . 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 Anemia of chronic renal failure, stage 5 (HCC) 04/14/2016  . ESRD on dialysis (HCC)   . History of Clostridium difficile colitis 03/20/2016  . COPD (chronic obstructive pulmonary disease) (HCC) 03/20/2016  . Obstructive sleep apnea 03/20/2016  . AICD (automatic cardioverter/defibrillator) present 03/20/2016  . Physical deconditioning 03/02/2016  . DVT (deep venous thrombosis), right 03/02/2016  . Cardiomyopathy, ischemic 03/02/2016  . Coronary artery disease involving native coronary artery of native heart without angina pectoris 03/02/2016  . Diabetes mellitus type 2 in obese (HCC) 03/02/2016    Past Surgical History:  Procedure Laterality Date  . AV FISTULA PLACEMENT Left 04/03/2016   Procedure: ARTERIOVENOUS (AV) FISTULA CREATION;  Surgeon: Larina Earthly, MD;  Location: [facility] OR;  Service: Vascular;  Laterality: Left;  . AV FISTULA PLACEMENT Right 08/21/2016   Procedure: INSERTION RIGHT ARM ARTERIOVENOUS GRAFT USING 4-7MM X45  CM ACUSEAL GRAFT;  Surgeon: Maeola Harman, MD;  Location: [facility] OR;  Service: Vascular;  Laterality: Right;  . CORONARY ANGIOPLASTY  LCX stent 2010; by [facility] 01/2016 note: 03/2014 LHC/RHC: minor luminal irregularities LAD and RCA with patent stent first marginal branch of CX. RA 11, PAP 58/22 with mean 39, mean wedge pressure 19, LVEDP 27, PVR 3.9, CO 5.1, Cardiac index 2.5.   .  FISTULA SUPERFICIALIZATION Left 06/16/2016   Procedure: SUPERFICIALIZATION OF LEFT ARM BRACHIOCEPHALIC ARTERIOVENOUS FISTULA;  Surgeon: Chuck Hint, MD;  Location: [facility] OR;  Service: Vascular;  Laterality: Left;  . INSERTION OF DIALYSIS CATHETER Left 04/03/2016   Procedure: INSERTION OF DIALYSIS CATHETER;  Surgeon: Larina Earthly, MD;  Location: [facility] OR;  Service: Vascular;  Laterality: Left;  . LIGATION OF COMPETING BRANCHES OF ARTERIOVENOUS FISTULA Left 06/16/2016   Procedure: LIGATION OF COMPETING BRANCHES OF left brachiocephalic ARTERIOVENOUS FISTULA;  Surgeon: Chuck Hint, MD;  Location: [facility] OR;  Service: Vascular;  Laterality: Left;  . PERIPHERAL VASCULAR CATHETERIZATION N/A 07/20/2016   Procedure: A/V Shuntogram/Fistulagram;  Surgeon: Fransisco Hertz, MD;  Location: [facility] INVASIVE CV LAB;  Service: Cardiovascular;  Laterality: N/A;  . PERIPHERAL VASCULAR CATHETERIZATION N/A 07/20/2016   Procedure: Dialysis/Perma Catheter Insertion;  Surgeon: Fransisco Hertz, MD;  Location: [facility] INVASIVE CV LAB;  Service: Cardiovascular;  Laterality: N/A;  . REVISION OF ARTERIOVENOUS GORETEX GRAFT Left 06/16/2016   Procedure: REVISION OF LEFT BRACHIOCEPHALIC ARTERIOVENOUS FISTULA WITH RESECTION OF REDUNDANT SECTION;  Surgeon: Chuck Hint, MD;  Location: [facility] OR;  Service: Vascular;  Laterality: Left;    OB History    No data available       Home Medications    Prior to Admission medications   Medication Sig Start Date End Date Taking? Authorizing Provider  acetaminophen (TYLENOL) 325 MG tablet Take 325 mg by mouth every 6 (six) hours as needed for mild pain or fever (For fever >99.5).     Historical Provider, MD  albuterol (PROAIR HFA) 108 (90 Base) MCG/ACT inhaler Inhale 2 puffs into the lungs every 6 (six) hours as needed for wheezing or shortness of breath.     Historical Provider, MD  atorvastatin (LIPITOR) 10 MG tablet Take 10 mg by mouth at bedtime.     Historical Provider, MD  budesonide  (PULMICORT) 0.5 MG/2ML nebulizer solution Take 0.5 mg by nebulization 2 (two) times daily.    Historical Provider, MD  famotidine (PEPCID) 20 MG tablet Take 1 tablet (20 mg total) by mouth at bedtime. 07/22/16   Maryann Mikhail, DO  hydrOXYzine (ATARAX/VISTARIL) 50 MG tablet Take 50 mg by mouth every 6 (six) hours as needed for itching.    Historical Provider, MD  insulin glargine (LANTUS) 100 unit/mL SOPN Inject 0.1 mLs (10 Units total) into the skin at bedtime. 05/09/16   Catarina Hartshorn, MD  insulin lispro (HUMALOG) 100 UNIT/ML injection Inject 0-10 Units into the skin 3 (three) times daily as needed for high blood sugar (CBG >180). CBG 0-59 Hypoglycemic Protocol, 60-149 0 units 150-250 5 units, 251-300 8 units, 301-350 10 units, >350 call MD    Historical Provider, MD  Inulin (FIBER CHOICE PO) Take 2 tablets by mouth daily as needed.    Historical Provider, MD  loperamide (IMODIUM) 2 MG capsule Take 1 capsule (2 mg total) by mouth 4 (four) times daily as needed for diarrhea or loose stools. 09/16/16   Jacalyn Lefevre, MD  LORazepam (ATIVAN) 0.5 MG tablet Take 1 tablet (0.5 mg total) by mouth 2 (two) times daily as needed for anxiety. 07/22/16   Maryann Mikhail, DO  metoprolol succinate (TOPROL-XL) 25 MG  24 hr tablet Take 12.5 mg by mouth daily.    Historical Provider, MD  mirtazapine (REMERON) 15 MG tablet Take 15 mg by mouth at bedtime.    Historical Provider, MD  Multiple Vitamins-Minerals (CERTAVITE/ANTIOXIDANTS) TABS Take 1 tablet by mouth at bedtime.    Historical Provider, MD  oxyCODONE-acetaminophen (PERCOCET/ROXICET) 5-325 MG tablet Take 1-2 tablets by mouth every 6 (six) hours as needed for severe pain.    Historical Provider, MD  OXYGEN Inhale 2 L into the lungs as needed.    Historical Provider, MD  sertraline (ZOLOFT) 25 MG tablet Take 25 mg by mouth daily.    Historical Provider, MD    Family History No family history on file.  Social History Social History  Substance Use Topics  .  Smoking status: Former Smoker    Types: Cigarettes    Quit date: 03/03/2006  . Smokeless tobacco: Never Used  . Alcohol use No     Allergies   Entresto [sacubitril-valsartan]; Latex; and Sulfa antibiotics   Review of Systems Review of Systems  Constitutional: Negative for diaphoresis.  HENT: Negative.   Respiratory: Negative for shortness of breath.   Cardiovascular: Positive for syncope. Negative for chest pain and palpitations.  Gastrointestinal: Negative for abdominal pain and nausea.  Genitourinary: Negative.  Negative for bladder incontinence.  Musculoskeletal: Negative.   Skin: Negative.   Allergic/Immunologic: Negative for immunocompromised state.  Neurological: Positive for dizziness and light-headedness. Negative for vertigo, seizures and weakness.  Psychiatric/Behavioral: Negative.      Physical Exam Updated Vital Signs BP 105/55   Pulse 79   Temp 97.5 F (36.4 C) (Oral)   Resp 16   SpO2 96%   Physical Exam  Constitutional: She is oriented to person, place, and time. She appears well-developed and well-nourished. No distress.  HENT:  Head: Normocephalic and atraumatic.  Eyes: Conjunctivae and EOM are normal. Pupils are equal, round, and reactive to light.  Neck: Normal range of motion. Neck supple. No JVD present.  Cardiovascular: Normal rate, regular rhythm, normal heart sounds and intact distal pulses.  Exam reveals no gallop and no friction rub.   No murmur heard. Pulmonary/Chest: Effort normal and breath sounds normal. No respiratory distress. She has no wheezes. She has no rales.  Abdominal: Soft. She exhibits no distension. There is no tenderness. There is no rebound and no guarding.  Neurological: She is alert and oriented to person, place, and time.  CN II-XII intact. No facial droop. PERRL, EOMI. Normal finger-to-nose bilaterally. 5/5 strength of shoulder flexion/extension, elbow flexion/extension, and grip strength bilaterally. 5/5 strength of hip  flexion/extension and dorsiflexion/plantarflexion bilaterally. Sensation intact in all extremities.  Skin: Skin is warm and dry. Capillary refill takes less than 2 seconds. No rash noted. She is not diaphoretic. No pallor.  Psychiatric: She has a normal mood and affect. Her behavior is normal. Judgment and thought content normal.     ED Treatments / Results  Labs (all labs ordered are listed, but only abnormal results are displayed) Labs Reviewed  CBC WITH DIFFERENTIAL/PLATELET - Abnormal; Notable for the following:       Result Value   Hemoglobin 9.9 (*)    HCT 32.7 (*)    MCH 25.2 (*)    RDW 17.3 (*)    All other components within normal limits  BASIC METABOLIC PANEL - Abnormal; Notable for the following:    Potassium 3.0 (*)    Chloride 98 (*)    Creatinine, Ser 2.03 (*)    Calcium  8.1 (*)    GFR calc non Af Amer 24 (*)    GFR calc Af Amer 28 (*)    All other components within normal limits    EKG  EKG Interpretation  Date/Time:  Saturday September 23 2016 17:26:58 EDT Ventricular Rate:  85 PR Interval:    QRS Duration: 98 QT Interval:  468 QTC Calculation: 557 R Axis:   -103 Text Interpretation:  Sinus rhythm Left anterior fascicular block Low voltage, extremity leads Prolonged QT interval Baseline wander in lead(s) II III aVF No significant change since last tracing Confirmed by KNOTT MD, DANIEL [id number]) on 09/23/2016 5:29:38 PM       Radiology No results found.  Procedures Procedures (including critical care time)  Medications Ordered in ED Medications  sodium chloride 0.9 % bolus 500 mL (0 mLs Intravenous Stopped 09/23/16 1937)  acetaminophen (TYLENOL) tablet 650 mg (650 mg Oral Given 09/23/16 1731)     Initial Impression / Assessment and Plan / ED Course  I have reviewed the triage vital signs and the nursing notes.  Pertinent labs & imaging results that were available during my care of the patient were reviewed by me and considered in my medical  decision making (see chart for details).  Clinical Course    Patient presents with a near-syncopal episode and lightheadedness following her dialysis session today. Denies head injury. Reports symptoms are now resolved. She is overall well-appearing, alert and oriented, with a normal non-focal neurological examination. Denies vertigo and I do not suspect acute CVA as the cause of her symptoms. She has an unremarkable EKG with no signs of acute ischemic changes, and has no chest pain or dyspnea. Do not suspect arrhythmia or ACS as the cause of her lightheadedness as she also had no palpitations. Labs reveal normal electrolytes, no signs of worsening anemia as her hemoglobin is stable. She was given 500 cc's of NS as it is likely that her symptoms were due to volume being taken off during dialysis. She improved after receiving fluid resuscitation. She was given return precautions for worsening symptoms and expressed understanding. She is in good condition for discharge home.  Final Clinical Impressions(s) / ED Diagnoses   Final diagnoses:  Dizziness  Dehydration    New Prescriptions Discharge Medication List as of 09/23/2016  7:16 PM       Preston FleetingAnna Damontae Loppnow, MD 09/24/16 1502    Lyndal Pulleyaniel Knott, MD 09/25/16 [phone]

## 2016-09-23 NOTE — ED Notes (Signed)
Cleaned patient up patient is resting 

## 2016-09-23 NOTE — ED Triage Notes (Signed)
Per ems- pt comes from dialysis center when she just finished her dialysis treatment when she had a dizziness episode and staff reports she didn't have LOC but wasn't all the way there. BP at that time was 90 systolic. When EMS arrived pt was a x 4. BP 145/70 and she didn't want to come b/c she felt good. Isn't sure why they sent her here and wants to go back to [facility]. CBG 92, 82 HR, 100% 3 L. Negative stroke scale.

## 2016-09-23 NOTE — ED Notes (Addendum)
ED resident reports okay to use patient's left hand for IV access. Also, pt wants staff to call [facility] and let them know she is here. Secretary will call and make aware.

## 2016-09-25 ENCOUNTER — Non-Acute Institutional Stay (SKILLED_NURSING_FACILITY): Payer: Medicare PPO | Admitting: Family

## 2016-09-25 DIAGNOSIS — Z992 Dependence on renal dialysis: Secondary | ICD-10-CM | POA: Diagnosis not present

## 2016-09-25 DIAGNOSIS — D631 Anemia in chronic kidney disease: Secondary | ICD-10-CM | POA: Diagnosis not present

## 2016-09-25 DIAGNOSIS — N186 End stage renal disease: Secondary | ICD-10-CM

## 2016-09-25 DIAGNOSIS — R42 Dizziness and giddiness: Secondary | ICD-10-CM | POA: Diagnosis not present

## 2016-09-25 LAB — TSH: TSH: 1.19 u[IU]/mL (ref 0.41–5.90)

## 2016-09-25 NOTE — Progress Notes (Signed)
Location:  [facility] Room Number: [phone] Place of Service:  SNF (31) Provider: Allure Greaser FNP-C   [name], MD  Patient Care Team: [name], MD as PCP - General (Internal Medicine) Laurey Morale, MD as Consulting Physician (Cardiology) Bobbye Riggs, MD (Specialist)  Extended Emergency Contact Information Primary Emergency Contact: Haith,[name] Address: [address]          [place], [place] [address] [place] of [place] Home Phone: [phone] Relation: Sister Secondary Emergency Contact: Marcello Moores, [place] Home Phone: [phone] Relation: Sister  Code Status:  Full Code  Goals of care: Advanced Directive information Advanced Directives 09/16/2016  Does patient have an advance directive? No  Type of Advance Directive -  Does patient want to make changes to advanced directive? -  Copy of advanced directive(s) in chart? -  Would patient like information on creating an advanced directive? -     Chief Complaint  Patient presents with  . Acute Visit    ED follow up     HPI:  Pt is a 65 y.o. female seen today at [facility] for an acute visit for Hospital follow up. She has a medical history of Type 2 DM, COPD, CAD, CHF, ESRD, PVD, Depression among other conditions. She is seen in her room today. She was seen in the ED 09/23/2016 for near syncope episode after dialysis. Her symptoms resolved after getting 500 Normal Saline bolus.She denies any acute issues this visit states " I think they took off too much fluid during dialysis". Facility staff reports no new concerns.    Past Medical History:  Diagnosis Date  . AICD (automatic cardioverter/defibrillator) present    Biotronik Inventra 7 VR-T DX 04/12/15  . Anemia   . Anxiety   . C. difficile colitis   . Cardiomyopathy (HCC)    02/02/16 ([facility]): Mixed ischemic and non-ischemic cardiomyopathy  . CHF  (congestive heart failure) (HCC)    systolic  . COPD (chronic obstructive pulmonary disease) (HCC)   . Coronary artery disease   . Diabetes mellitus (HCC)   . DVT (deep venous thrombosis) (HCC)    on coumadin  . ESRD (end stage renal disease) (HCC)    tues/thurs/sat dialysis  . Hypertension   . Neuropathy (HCC)   . Obstructive sleep apnea    no cpap  . Renal disorder   . Wears glasses    Past Surgical History:  Procedure Laterality Date  . AV FISTULA PLACEMENT Left 04/03/2016   Procedure: ARTERIOVENOUS (AV) FISTULA CREATION;  Surgeon: Larina Earthly, MD;  Location: [facility] OR;  Service: Vascular;  Laterality: Left;  . AV FISTULA PLACEMENT Right 08/21/2016   Procedure: INSERTION RIGHT ARM ARTERIOVENOUS GRAFT USING 4-7MM X45  CM ACUSEAL GRAFT;  Surgeon: Maeola Harman, MD;  Location: [facility] OR;  Service: Vascular;  Laterality: Right;  . CORONARY ANGIOPLASTY     LCX stent 2010; by [facility] 01/2016 note: 03/2014 LHC/RHC: minor luminal irregularities LAD and RCA with patent stent first marginal branch of CX. RA 11, PAP 58/22 with mean 39, mean wedge pressure 19, LVEDP 27, PVR 3.9, CO 5.1, Cardiac index 2.5.   . FISTULA SUPERFICIALIZATION Left 06/16/2016   Procedure: SUPERFICIALIZATION OF LEFT ARM BRACHIOCEPHALIC ARTERIOVENOUS FISTULA;  Surgeon: Chuck Hint, MD;  Location[facility] OR;  Service: Vascular;  Laterality: Left;  . INSERTION OF DIALYSIS CATHETER  Left 04/03/2016   Procedure: INSERTION OF DIALYSIS CATHETER;  Surgeon: Larina Earthlyodd F Early, MD;  [facility];  Service: Vascular;  Laterality: Left;  . LIGATION OF COMPETING BRANCHES OF ARTERIOVENOUS FISTULA Left 06/16/2016   Procedure: LIGATION OF COMPETING BRANCHES OF left brachiocephalic ARTERIOVENOUS FISTULA;  Surgeon: Chuck Hinthristopher S Dickson, MD;  [facility];  Service: Vascular;  Laterality: Left;  . PERIPHERAL VASCULAR CATHETERIZATION N/A 07/20/2016   Procedure: A/V Shuntogram/Fistulagram;  Surgeon: Fransisco HertzBrian L Chen, MD;  [facility]  INVASIVE CV LAB;  Service: Cardiovascular;  Laterality: N/A;  . PERIPHERAL VASCULAR CATHETERIZATION N/A 07/20/2016   Procedure: Dialysis/Perma Catheter Insertion;  Surgeon: Fransisco HertzBrian L Chen, MD;  Location: [facility] INVASIVE CV LAB;  Service: Cardiovascular;  Laterality: N/A;  . REVISION OF ARTERIOVENOUS GORETEX GRAFT Left 06/16/2016   Procedure: REVISION OF LEFT BRACHIOCEPHALIC ARTERIOVENOUS FISTULA WITH RESECTION OF REDUNDANT SECTION;  Surgeon: Chuck Hinthristopher S Dickson, MD;  [facility];  Service: Vascular;  Laterality: Left;    Allergies  Allergen Reactions  . Entresto [Sacubitril-Valsartan] Other (See Comments)     unknown reaction  . Latex Itching  . Sulfa Antibiotics Itching      Medication List       Accurate as of 09/25/16  3:01 PM. Always use your most recent med list.          acetaminophen 325 MG tablet Commonly known as:  TYLENOL Take 325 mg by mouth every 6 (six) hours as needed for mild pain or fever (For fever >99.5).   atorvastatin 10 MG tablet Commonly known as:  LIPITOR Take 10 mg by mouth at bedtime.   budesonide 0.5 MG/2ML nebulizer solution Commonly known as:  PULMICORT Take 0.5 mg by nebulization 2 (two) times daily.   CERTAVITE/ANTIOXIDANTS Tabs Take 1 tablet by mouth at bedtime.   famotidine 20 MG tablet Commonly known as:  PEPCID Take 1 tablet (20 mg total) by mouth at bedtime.   FIBER CHOICE PO Take 2 tablets by mouth daily as needed.   hydrOXYzine 50 MG tablet Commonly known as:  ATARAX/VISTARIL Take 50 mg by mouth every 6 (six) hours as needed for itching.   insulin glargine 100 unit/mL Sopn Commonly known as:  LANTUS Inject 0.1 mLs (10 Units total) into the skin at bedtime.   insulin lispro 100 UNIT/ML injection Commonly known as:  HUMALOG Inject 0-10 Units into the skin 3 (three) times daily as needed for high blood sugar (CBG >180). CBG 0-59 Hypoglycemic Protocol, 60-149 0 units 150-250 5 units, 251-300 8 units, 301-350 10 units, >350 call  MD   loperamide 2 MG capsule Commonly known as:  IMODIUM Take 1 capsule (2 mg total) by mouth 4 (four) times daily as needed for diarrhea or loose stools.   LORazepam 0.5 MG tablet Commonly known as:  ATIVAN Take 1 tablet (0.5 mg total) by mouth 2 (two) times daily as needed for anxiety.   metoprolol succinate 25 MG 24 hr tablet Commonly known as:  TOPROL-XL Take 12.5 mg by mouth daily.   mirtazapine 15 MG tablet Commonly known as:  REMERON Take 15 mg by mouth at bedtime.   oxyCODONE-acetaminophen 5-325 MG tablet Commonly known as:  PERCOCET/ROXICET Take 1-2 tablets by mouth every 6 (six) hours as needed for severe pain.   OXYGEN Inhale 2 L into the lungs as needed.   PROAIR HFA 108 (90 Base) MCG/ACT inhaler Generic drug:  albuterol Inhale 2 puffs into the lungs every 6 (six) hours as needed for wheezing or shortness of  breath.   sertraline 25 MG tablet Commonly known as:  ZOLOFT Take 25 mg by mouth daily.       Review of Systems  Constitutional: Negative for activity change, appetite change, chills, fatigue and fever.  HENT: Negative for congestion, rhinorrhea, sinus pressure, sneezing and sore throat.   Eyes: Negative.   Respiratory: Negative for cough, chest tightness, shortness of breath and wheezing.   Cardiovascular: Negative for chest pain and palpitations.  Gastrointestinal: Negative for abdominal distention, abdominal pain, constipation, diarrhea, nausea and vomiting.       Abdominal Hernia.   Endocrine: Negative.   Genitourinary: Negative for dysuria, frequency and urgency.  Musculoskeletal: Positive for gait problem.  Skin: Negative for color change, pallor and rash.          Neurological: Negative for dizziness, seizures, syncope, light-headedness, numbness and headaches.  Hematological: Does not bruise/bleed easily.  Psychiatric/Behavioral: Negative for agitation, confusion, hallucinations, sleep disturbance and suicidal ideas. The patient is not  nervous/anxious.     Immunization History  Administered Date(s) Administered  . PPD Test 03/01/2016, 04/10/2016   Pertinent  Health Maintenance Due  Topic Date Due  . INFLUENZA VACCINE  06/27/2016  . FOOT EXAM  11/27/2016 (Originally 07/23/1961)  . MAMMOGRAM  11/27/2016 (Originally 07/23/2000)  . OPHTHALMOLOGY EXAM  11/27/2016 (Originally 07/23/1961)  . URINE MICROALBUMIN  11/27/2016 (Originally 07/23/1961)  . DEXA SCAN  11/27/2016 (Originally 07/24/2015)  . COLONOSCOPY  11/27/2016 (Originally 07/23/2000)  . PNA vac Low Risk Adult (1 of 2 - PCV13) 11/27/2016 (Originally 07/24/2015)  . HEMOGLOBIN A1C  01/24/2017      Vitals:   09/25/16 1100  BP: (!) 144/71  Pulse: 80  Resp: 18  Temp: 97.5 F (36.4 C)  SpO2: 98%  Weight: 163 lb (73.9 kg)  Height: 5\' 8"  (1.727 m)   Body mass index is 24.78 kg/m. Physical Exam  Constitutional: She is oriented to person, place, and time. She appears well-developed and well-nourished. No distress.  HENT:  Head: Normocephalic.  Mouth/Throat: Oropharynx is clear and moist. No oropharyngeal exudate.  Eyes: Conjunctivae and EOM are normal. Pupils are equal, round, and reactive to light. Right eye exhibits no discharge. Left eye exhibits no discharge. No scleral icterus.  Neck: Normal range of motion. No JVD present. No thyromegaly present.  Cardiovascular: Normal rate, regular rhythm, normal heart sounds and intact distal pulses.  Exam reveals no gallop and no friction rub.   No murmur heard. Pulmonary/Chest: Effort normal and breath sounds normal. No respiratory distress. She has no wheezes. She has no rales.  Abdominal: Soft. Bowel sounds are normal. She exhibits no distension. There is no rebound and no guarding.  Umbilical Hernia reducible.   Genitourinary:  Genitourinary Comments: On Hemo dialysis x 3 per week   Musculoskeletal: She exhibits no edema, tenderness or deformity.  Unsteady gait uses wheelchair.   Lymphadenopathy:    She has no  cervical adenopathy.  Neurological: She is oriented to person, place, and time.  Skin: Skin is warm and dry. No rash noted. No erythema. No pallor.  AV fistula. Right groin Hemodialysis access site dry clean and intact.   Psychiatric: She has a normal mood and affect.    Labs reviewed:  Recent Labs  04/03/16 1423  05/08/16 0436  07/15/16 1800 07/16/16 0545 07/17/16 1400  07/22/16 1109  08/26/16 2046 09/16/16 1232 09/23/16 1749  NA  --   < > 137  < > 135 135 136  < > 135  < > 137 135  135  K  --   < > 3.5  < > 4.3 4.0 4.1  < > 3.5  < > 3.6 3.5 3.0*  CL  --   < > 103  < > 97* 97* 100*  < > 102  --  100* 97* 98*  CO2  --   < > 25  < > 25 26 29   < > 24  --  26 27 25   GLUCOSE  --   < > 76  < > 152* 230* 124*  < > 64*  < > 118* 73 89  BUN  --   < > 28*  < > 40* 26* 40*  < > 12  < > 14 47* 13  CREATININE  --   < > 6.52*  < > 6.36* 4.62* 5.64*  < > 2.74*  < > 3.02* 4.90* 2.03*  CALCIUM  --   < > 8.0*  < > 8.5* 8.6* 8.6*  < > 8.2*  --  8.1* 8.8* 8.1*  MG 2.1  --  1.9  --  2.2  --   --   --   --   --   --   --   --   PHOS  --   < >  --   < > 4.7* 4.3 4.3  --  1.7*  --   --   --   --   < > = values in this interval not displayed.  Recent Labs  05/06/16 1240  07/22/16 1109 07/26/16 08/26/16 2046 09/16/16 1232  AST 18  < >  --  17 25 17   ALT 11*  < >  --  12 12* 12*  ALKPHOS 180*  < >  --  179* 194* 182*  BILITOT 1.5*  --   --   --  0.7 0.8  PROT 7.3  --   --   --  8.0 8.7*  ALBUMIN 2.4*  < > 2.0*  --  1.8* 1.9*  < > = values in this interval not displayed.  Recent Labs  08/26/16 2046 09/16/16 1232 09/23/16 1749  WBC 19.4* 13.3* 8.8  NEUTROABS 16.4* 10.1* 5.9  HGB 9.2* 10.0* 9.9*  HCT 30.5* 32.5* 32.7*  MCV 83.3 81.7 83.2  PLT 258 342 PLATELET CLUMPS NOTED ON SMEAR, COUNT APPEARS ADEQUATE   Lab Results  Component Value Date   TSH 1.993 03/22/2016   Lab Results  Component Value Date   HGBA1C 5.9 07/26/2016   Lab Results  Component Value Date   CHOL 49 03/17/2016     HDL 14 (A) 03/17/2016   LDLCALC 21 03/17/2016   TRIG 68 03/17/2016   Assessment/Plan Dizziness  Symptoms have resolved. Had 500 NS bolus in ED. Has had no symptoms since then. Will obtain CBC/diff  Anemia  Possible due to ESRD. CBC as above.   Family/ staff Communication: Reviewed plan of care with patient and facility Nurse supervisor.   Labs/tests ordered: CBC/diff  09/26/2016

## 2016-09-26 LAB — CBC AND DIFFERENTIAL
HCT: 31 % — AB (ref 36–46)
HEMOGLOBIN: 9.4 g/dL — AB (ref 12.0–16.0)
Neutrophils Absolute: 6 /uL
WBC: 9.2 10^3/mL

## 2016-10-02 ENCOUNTER — Encounter: Payer: Self-pay | Admitting: Family

## 2016-10-02 ENCOUNTER — Other Ambulatory Visit: Payer: Self-pay | Admitting: *Deleted

## 2016-10-02 ENCOUNTER — Non-Acute Institutional Stay (SKILLED_NURSING_FACILITY): Payer: Medicare PPO | Admitting: Family

## 2016-10-02 DIAGNOSIS — Z95810 Presence of automatic (implantable) cardiac defibrillator: Secondary | ICD-10-CM | POA: Diagnosis not present

## 2016-10-02 DIAGNOSIS — R079 Chest pain, unspecified: Secondary | ICD-10-CM

## 2016-10-02 MED ORDER — OXYCODONE-ACETAMINOPHEN 5-325 MG PO TABS: ORAL_TABLET | ORAL | 0 refills | Status: DC

## 2016-10-02 NOTE — Progress Notes (Signed)
Location:  [facility] Room Number: 602-P Place of Service:  SNF (31) Provider:  Alexandr Oehler FNP-C   [name], MD  Patient Care Team: [name], MD as PCP - General (Internal Medicine) Laurey Morale, MD as Consulting Physician (Cardiology) Bobbye Riggs, MD (Specialist)  Extended Emergency Contact Information Primary Emergency Contact: Haith,[name] Address: [address]          [place], [place] [address] [place] of [place] Home Phone: [phone] Relation: Sister Secondary Emergency Contact: Marcello Moores, [place] Home Phone: [phone] Relation: Sister  Code Status:  Full Code  Goals of care: Advanced Directive information Advanced Directives 09/16/2016  Does patient have an advance directive? No  Type of Advance Directive -  Does patient want to make changes to advanced directive? -  Copy of advanced directive(s) in chart? -  Would patient like information on creating an advanced directive? -     Chief Complaint  Patient presents with  . Acute Visit    follow chest pain     HPI:  Pt is a 65 y.o. female seen today at [facility] for an acute visit for follow up chest pain. She has a medical history of ESRD on hemodialysis, CHF, COPD,PVD, Type 2 DM,GAD, AICD among other conditions. She is seen in her room today. She states had chest pain three days ago while on the facility hallway felt shock from her AICD. She received nitroglycerine X 3 dose with relief. Patient's previous Cardiologist from [place] called to notify Dr. Glade Lloyd AICD administered a shock need evaluation.She denies any acute issues this visit.    Past Medical History:  Diagnosis Date  . AICD (automatic cardioverter/defibrillator) present    Biotronik Inventra 7 VR-T DX 04/12/15  . Anemia   . Anxiety   . C. difficile colitis   . Cardiomyopathy (HCC)    02/02/16 ([facility]): Mixed ischemic and  non-ischemic cardiomyopathy  . CHF (congestive heart failure) (HCC)    systolic  . COPD (chronic obstructive pulmonary disease) (HCC)   . Coronary artery disease   . Diabetes mellitus (HCC)   . DVT (deep venous thrombosis) (HCC)    on coumadin  . ESRD (end stage renal disease) (HCC)    tues/thurs/sat dialysis  . Hypertension   . Neuropathy (HCC)   . Obstructive sleep apnea    no cpap  . Renal disorder   . Wears glasses    Past Surgical History:  Procedure Laterality Date  . AV FISTULA PLACEMENT Left 04/03/2016   Procedure: ARTERIOVENOUS (AV) FISTULA CREATION;  Surgeon: Larina Earthly, MD;  Location: [facility] OR;  Service: Vascular;  Laterality: Left;  . AV FISTULA PLACEMENT Right 08/21/2016   Procedure: INSERTION RIGHT ARM ARTERIOVENOUS GRAFT USING 4-7MM X45  CM ACUSEAL GRAFT;  Surgeon: Maeola Harman, MD;  Location: [facility] OR;  Service: Vascular;  Laterality: Right;  . CORONARY ANGIOPLASTY     LCX stent 2010; by [facility] 01/2016 note: 03/2014 LHC/RHC: minor luminal irregularities LAD and RCA with patent stent first marginal branch of CX. RA 11, PAP 58/22 with mean 39, mean wedge pressure 19, LVEDP 27, PVR 3.9, CO 5.1, Cardiac index 2.5.   . FISTULA SUPERFICIALIZATION Left 06/16/2016   Procedure: SUPERFICIALIZATION OF LEFT ARM BRACHIOCEPHALIC ARTERIOVENOUS FISTULA;  Surgeon: Chuck Hint, MD;  Location: [facility] OR;  Service: Vascular;  Laterality: Left;  . INSERTION  OF DIALYSIS CATHETER Left 04/03/2016   Procedure: INSERTION OF DIALYSIS CATHETER;  Surgeon: Larina Earthly, MD;  Location: [facility] OR;  Service: Vascular;  Laterality: Left;  . LIGATION OF COMPETING BRANCHES OF ARTERIOVENOUS FISTULA Left 06/16/2016   Procedure: LIGATION OF COMPETING BRANCHES OF left brachiocephalic ARTERIOVENOUS FISTULA;  Surgeon: Chuck Hint, MD;  Location: [facility] OR;  Service: Vascular;  Laterality: Left;  . PERIPHERAL VASCULAR CATHETERIZATION N/A 07/20/2016   Procedure: A/V Shuntogram/Fistulagram;   Surgeon: Fransisco Hertz, MD;  Location: [facility] INVASIVE CV LAB;  Service: Cardiovascular;  Laterality: N/A;  . PERIPHERAL VASCULAR CATHETERIZATION N/A 07/20/2016   Procedure: Dialysis/Perma Catheter Insertion;  Surgeon: Fransisco Hertz, MD;  Location: [facility] INVASIVE CV LAB;  Service: Cardiovascular;  Laterality: N/A;  . REVISION OF ARTERIOVENOUS GORETEX GRAFT Left 06/16/2016   Procedure: REVISION OF LEFT BRACHIOCEPHALIC ARTERIOVENOUS FISTULA WITH RESECTION OF REDUNDANT SECTION;  Surgeon: Chuck Hint, MD;  Location: [facility] OR;  Service: Vascular;  Laterality: Left;    Allergies  Allergen Reactions  . Entresto [Sacubitril-Valsartan] Other (See Comments)     unknown reaction  . Latex Itching  . Sulfa Antibiotics Itching      Medication List       Accurate as of 10/02/16  9:11 PM. Always use your most recent med list.          acetaminophen 325 MG tablet Commonly known as:  TYLENOL Take 650 mg by mouth every 6 (six) hours as needed for mild pain or fever (For fever >99.5).   atorvastatin 10 MG tablet Commonly known as:  LIPITOR Take 10 mg by mouth at bedtime.   budesonide 0.5 MG/2ML nebulizer solution Commonly known as:  PULMICORT Take 0.5 mg by nebulization 2 (two) times daily.   CERTAVITE/ANTIOXIDANTS Tabs Take 1 tablet by mouth at bedtime.   famotidine 20 MG tablet Commonly known as:  PEPCID Take 1 tablet (20 mg total) by mouth at bedtime.   FIBER CHOICE PO Take 2 tablets by mouth daily as needed.   hydrOXYzine 50 MG tablet Commonly known as:  ATARAX/VISTARIL Take 50 mg by mouth every 6 (six) hours as needed for itching.   insulin glargine 100 UNIT/ML injection Commonly known as:  LANTUS Inject 8 Units into the skin daily.   insulin lispro 100 UNIT/ML injection Commonly known as:  HUMALOG Inject 0-10 Units into the skin 3 (three) times daily as needed for high blood sugar (CBG >180). CBG 0-59 Hypoglycemic Protocol, 60-149 0 units 150-250 5 units, 251-300 8 units, 301-350  10 units, >350 call MD   loperamide 2 MG capsule Commonly known as:  IMODIUM Take 1 capsule (2 mg total) by mouth 4 (four) times daily as needed for diarrhea or loose stools.   LORazepam 0.5 MG tablet Commonly known as:  ATIVAN Take 1 tablet (0.5 mg total) by mouth 2 (two) times daily as needed for anxiety.   metoprolol succinate 25 MG 24 hr tablet Commonly known as:  TOPROL-XL Take 12.5 mg by mouth daily.   mirtazapine 15 MG tablet Commonly known as:  REMERON Take 15 mg by mouth at bedtime.   nitroGLYCERIN 0.4 MG SL tablet Commonly known as:  NITROSTAT Place 0.4 mg under the tongue every 5 (five) minutes as needed for chest pain.   oxyCODONE-acetaminophen 5-325 MG tablet Commonly known as:  PERCOCET/ROXICET Take one tablet by mouth every 6 hours as needed for mild pain; Take two tablets by mouth every 6 hours as needed for severe pain. Do not exceed  4gm of Tylenol in 24 hours   OXYGEN Inhale 2 L into the lungs as needed.   PROAIR HFA 108 (90 Base) MCG/ACT inhaler Generic drug:  albuterol Inhale 2 puffs into the lungs every 6 (six) hours as needed for wheezing or shortness of breath.   sertraline 25 MG tablet Commonly known as:  ZOLOFT Take 25 mg by mouth daily.       Review of Systems  Constitutional: Negative for activity change, appetite change, chills, fatigue and fever.  HENT: Negative for congestion, rhinorrhea, sinus pressure, sneezing and sore throat.   Eyes: Negative.   Respiratory: Negative for cough, chest tightness, shortness of breath and wheezing.   Cardiovascular: Negative for chest pain, palpitations and leg swelling.       Chest pain per HPI   Gastrointestinal: Negative for abdominal distention, abdominal pain, constipation, diarrhea, nausea and vomiting.       Abdominal Hernia.   Endocrine: Negative.   Genitourinary: Negative for dysuria, frequency and urgency.  Musculoskeletal: Positive for gait problem.  Skin: Negative for color change, pallor  and rash.          Neurological: Negative for dizziness, seizures, syncope, light-headedness, numbness and headaches.  Hematological: Does not bruise/bleed easily.  Psychiatric/Behavioral: Negative for agitation, confusion, hallucinations, sleep disturbance and suicidal ideas. The patient is not nervous/anxious.     Immunization History  Administered Date(s) Administered  . Influenza-Unspecified 08/17/2016  . PPD Test 03/01/2016, 04/10/2016   Pertinent  Health Maintenance Due  Topic Date Due  . FOOT EXAM  11/27/2016 (Originally 07/23/1961)  . MAMMOGRAM  11/27/2016 (Originally 07/23/2000)  . OPHTHALMOLOGY EXAM  11/27/2016 (Originally 07/23/1961)  . URINE MICROALBUMIN  11/27/2016 (Originally 07/23/1961)  . DEXA SCAN  11/27/2016 (Originally 07/24/2015)  . COLONOSCOPY  11/27/2016 (Originally 07/23/2000)  . PNA vac Low Risk Adult (1 of 2 - PCV13) 11/27/2016 (Originally 07/24/2015)  . HEMOGLOBIN A1C  01/24/2017  . INFLUENZA VACCINE  Completed      Vitals:   10/02/16 1531  BP: 127/64  Pulse: 81  Resp: 18  Temp: 97 F (36.1 C)  TempSrc: Oral  SpO2: 97%  Weight: 163 lb (73.9 kg)  Height: 5\' 8"  (1.727 m)   Body mass index is 24.78 kg/m. Physical Exam  Constitutional: She is oriented to person, place, and time. She appears well-developed and well-nourished. No distress.  HENT:  Head: Normocephalic.  Mouth/Throat: Oropharynx is clear and moist. No oropharyngeal exudate.  Eyes: Conjunctivae and EOM are normal. Pupils are equal, round, and reactive to light. Right eye exhibits no discharge. Left eye exhibits no discharge. No scleral icterus.  Neck: Normal range of motion. No JVD present. No thyromegaly present.  Cardiovascular: Normal rate, regular rhythm, normal heart sounds and intact distal pulses.  Exam reveals no gallop and no friction rub.   No murmur heard. Pulmonary/Chest: Effort normal and breath sounds normal. No respiratory distress. She has no wheezes. She has no rales.    Abdominal: Soft. Bowel sounds are normal. She exhibits no distension. There is no rebound and no guarding.  Umbilical Hernia reducible.   Genitourinary:  Genitourinary Comments: On Hemo dialysis x 3 per week   Musculoskeletal: She exhibits no edema, tenderness or deformity.  Unsteady gait uses wheelchair.   Lymphadenopathy:    She has no cervical adenopathy.  Neurological: She is oriented to person, place, and time.  Skin: Skin is warm and dry. No rash noted. No erythema. No pallor.  AV fistula. Right groin Hemodialysis access site dry clean  and intact.   Psychiatric: She has a normal mood and affect.    Labs reviewed:  Recent Labs  04/03/16 1423  05/08/16 0436  07/15/16 1800 07/16/16 0545 07/17/16 1400  07/22/16 1109  08/26/16 2046 09/16/16 1232 09/23/16 1749  NA  --   < > 137  < > 135 135 136  < > 135  < > 137 135 135  K  --   < > 3.5  < > 4.3 4.0 4.1  < > 3.5  < > 3.6 3.5 3.0*  CL  --   < > 103  < > 97* 97* 100*  < > 102  --  100* 97* 98*  CO2  --   < > 25  < > 25 26 29   < > 24  --  26 27 25   GLUCOSE  --   < > 76  < > 152* 230* 124*  < > 64*  < > 118* 73 89  BUN  --   < > 28*  < > 40* 26* 40*  < > 12  < > 14 47* 13  CREATININE  --   < > 6.52*  < > 6.36* 4.62* 5.64*  < > 2.74*  < > 3.02* 4.90* 2.03*  CALCIUM  --   < > 8.0*  < > 8.5* 8.6* 8.6*  < > 8.2*  --  8.1* 8.8* 8.1*  MG 2.1  --  1.9  --  2.2  --   --   --   --   --   --   --   --   PHOS  --   < >  --   < > 4.7* 4.3 4.3  --  1.7*  --   --   --   --   < > = values in this interval not displayed.  Recent Labs  05/06/16 1240  07/22/16 1109 07/26/16 08/26/16 2046 09/16/16 1232  AST 18  < >  --  17 25 17   ALT 11*  < >  --  12 12* 12*  ALKPHOS 180*  < >  --  179* 194* 182*  BILITOT 1.5*  --   --   --  0.7 0.8  PROT 7.3  --   --   --  8.0 8.7*  ALBUMIN 2.4*  < > 2.0*  --  1.8* 1.9*  < > = values in this interval not displayed.  Recent Labs  08/26/16 2046 09/16/16 1232 09/23/16 1749  WBC 19.4* 13.3* 8.8   NEUTROABS 16.4* 10.1* 5.9  HGB 9.2* 10.0* 9.9*  HCT 30.5* 32.5* 32.7*  MCV 83.3 81.7 83.2  PLT 258 342 PLATELET CLUMPS NOTED ON SMEAR, COUNT APPEARS ADEQUATE   Lab Results  Component Value Date   TSH 1.993 03/22/2016   Lab Results  Component Value Date   HGBA1C 5.9 07/26/2016   Lab Results  Component Value Date   CHOL 49 03/17/2016   HDL 14 (A) 03/17/2016   LDLCALC 21 03/17/2016   TRIG 68 03/17/2016   Assessment/Plan 1. Chest pain, unspecified type Had chest pain three days ago while on the facility hallway felt shock from her AICD. She received nitroglycerine X 3 dose with relief. Patient's previous Cardiologist from [place] called to notify Dr. Glade Lloyd AICD administered a shock need evaluation.Refer to cardiology for evaluation.   2. AICD (automatic cardioverter/defibrillator) present Per Patient's previous Cardiologist from [place] AICD  administered a shock. Refer to Cardiology for evaluation of  AICD ASAP.      Family/ staff Communication: Reviewed plan with patient, DR. Glade LloydPandey and Ecologistfacility Nurse supervisor.   Labs/tests ordered: None

## 2016-10-02 NOTE — Telephone Encounter (Signed)
[facility] [phone] Fax: [phone]  

## 2016-10-11 ENCOUNTER — Ambulatory Visit (INDEPENDENT_AMBULATORY_CARE_PROVIDER_SITE_OTHER): Payer: Medicare PPO | Admitting: Internal Medicine

## 2016-10-11 ENCOUNTER — Encounter: Payer: Self-pay | Admitting: Internal Medicine

## 2016-10-11 VITALS — BP 110/78 | HR 67 | Ht 64.0 in

## 2016-10-11 DIAGNOSIS — Z95810 Presence of automatic (implantable) cardiac defibrillator: Secondary | ICD-10-CM | POA: Diagnosis not present

## 2016-10-11 MED ORDER — AMIODARONE HCL 200 MG PO TABS: 200.0000 mg | ORAL_TABLET | Freq: Every day | ORAL | 6 refills | Status: DC

## 2016-10-11 NOTE — Patient Instructions (Addendum)
Medication Instructions:  Your physician has recommended you make the following change in your medication: 1) START Amiodarone 200 mg daily   Labwork: None Ordered   Testing/Procedures: None Ordered   Follow-Up: Your physician wants you to follow-up in: 6 months with Dr. Ladona Ridgel.  You will receive a reminder letter in the mail two months in advance. If you don't receive a letter, please call our office to schedule the follow-up appointment.    Any Other Special Instructions Will Be Listed Below (If Applicable).     If you need a refill on your cardiac medications before your next appointment, please call your pharmacy.

## 2016-10-11 NOTE — Progress Notes (Signed)
HPI Brandy Dudley is referred today for ongoing evaluation and management of her ICD. She is pleasant 65 yo woman with multiple medical problems including ESRD on HD, chronic systolic heart failure, COPD, s/p ICD implantation.  Allergies  Allergen Reactions  . Entresto [Sacubitril-Valsartan] Other (See Comments)     unknown reaction  . Latex Itching  . Sulfa Antibiotics Itching     Current Outpatient Prescriptions  Medication Sig Dispense Refill  . acetaminophen (TYLENOL) 325 MG tablet Take 650 mg by mouth every 6 (six) hours as needed for mild pain or fever (For fever >99.5).     Marland Kitchen albuterol (PROAIR HFA) 108 (90 Base) MCG/ACT inhaler Inhale 2 puffs into the lungs every 6 (six) hours as needed for wheezing or shortness of breath.     Marland Kitchen atorvastatin (LIPITOR) 10 MG tablet Take 10 mg by mouth at bedtime.     . budesonide (PULMICORT) 0.5 MG/2ML nebulizer solution Take 0.5 mg by nebulization 2 (two) times daily.    . famotidine (PEPCID) 20 MG tablet Take 1 tablet (20 mg total) by mouth at bedtime.    . hydrOXYzine (ATARAX/VISTARIL) 50 MG tablet Take 50 mg by mouth every 6 (six) hours as needed for itching.    . insulin glargine (LANTUS) 100 UNIT/ML injection Inject 8 Units into the skin daily.    . insulin lispro (HUMALOG) 100 UNIT/ML injection Inject 0-10 Units into the skin 3 (three) times daily as needed for high blood sugar (CBG >180). CBG 0-59 Hypoglycemic Protocol, 60-149 0 units 150-250 5 units, 251-300 8 units, 301-350 10 units, >350 call MD    . Inulin (FIBER CHOICE PO) Take 2 tablets by mouth daily as needed.    . loperamide (IMODIUM) 2 MG capsule Take 1 capsule (2 mg total) by mouth 4 (four) times daily as needed for diarrhea or loose stools. 12 capsule 0  . LORazepam (ATIVAN) 0.5 MG tablet Take 1 tablet (0.5 mg total) by mouth 2 (two) times daily as needed for anxiety. 30 tablet 0  . metoprolol succinate (TOPROL-XL) 25 MG 24 hr tablet Take 12.5 mg by mouth 2 (two) times daily.  HOLD for SBP <110 and HP <60    . mirtazapine (REMERON) 15 MG tablet Take 15 mg by mouth at bedtime.    . Multiple Vitamins-Minerals (CERTAVITE/ANTIOXIDANTS) TABS Take 1 tablet by mouth at bedtime.    . nitroGLYCERIN (NITROSTAT) 0.4 MG SL tablet Place 0.4 mg under the tongue every 5 (five) minutes as needed for chest pain.    Marland Kitchen oxyCODONE-acetaminophen (PERCOCET/ROXICET) 5-325 MG tablet Take one tablet by mouth every 6 hours as needed for mild pain; Take two tablets by mouth every 6 hours as needed for severe pain. Do not exceed 4gm of Tylenol in 24 hours 240 tablet 0  . OXYGEN Inhale 2 L into the lungs as needed.    . sertraline (ZOLOFT) 25 MG tablet Take 25 mg by mouth daily.     No current facility-administered medications for this visit.      Past Medical History:  Diagnosis Date  . AICD (automatic cardioverter/defibrillator) present    Biotronik Inventra 7 VR-T DX 04/12/15  . Anemia   . Anxiety   . C. difficile colitis   . Cardiomyopathy (HCC)    02/02/16 ([facility]): Mixed ischemic and non-ischemic cardiomyopathy  . CHF (congestive heart failure) (HCC)    systolic  . COPD (chronic obstructive pulmonary disease) (HCC)   . Coronary artery disease   .  Diabetes mellitus (HCC)   . DVT (deep venous thrombosis) (HCC)    on coumadin  . ESRD (end stage renal disease) (HCC)    tues/thurs/sat dialysis  . Hypertension   . Neuropathy (HCC)   . Obstructive sleep apnea    no cpap  . Renal disorder   . Wears glasses     ROS:   All systems reviewed and negative except as noted in the HPI.   Past Surgical History:  Procedure Laterality Date  . AV FISTULA PLACEMENT Left 04/03/2016   Procedure: ARTERIOVENOUS (AV) FISTULA CREATION;  Surgeon: Larina Earthly, MD;  Location: [facility] OR;  Service: Vascular;  Laterality: Left;  . AV FISTULA PLACEMENT Right 08/21/2016   Procedure: INSERTION RIGHT ARM ARTERIOVENOUS GRAFT USING 4-7MM X45  CM ACUSEAL GRAFT;  Surgeon: Maeola Harman, MD;   Location: [facility] OR;  Service: Vascular;  Laterality: Right;  . CORONARY ANGIOPLASTY     LCX stent 2010; by [facility] 01/2016 note: 03/2014 LHC/RHC: minor luminal irregularities LAD and RCA with patent stent first marginal branch of CX. RA 11, PAP 58/22 with mean 39, mean wedge pressure 19, LVEDP 27, PVR 3.9, CO 5.1, Cardiac index 2.5.   . FISTULA SUPERFICIALIZATION Left 06/16/2016   Procedure: SUPERFICIALIZATION OF LEFT ARM BRACHIOCEPHALIC ARTERIOVENOUS FISTULA;  Surgeon: Chuck Hint, MD;  Location: [facility] OR;  Service: Vascular;  Laterality: Left;  . INSERTION OF DIALYSIS CATHETER Left 04/03/2016   Procedure: INSERTION OF DIALYSIS CATHETER;  Surgeon: Larina Earthly, MD;  Location: [facility] OR;  Service: Vascular;  Laterality: Left;  . LIGATION OF COMPETING BRANCHES OF ARTERIOVENOUS FISTULA Left 06/16/2016   Procedure: LIGATION OF COMPETING BRANCHES OF left brachiocephalic ARTERIOVENOUS FISTULA;  Surgeon: Chuck Hint, MD;  Location: [facility] OR;  Service: Vascular;  Laterality: Left;  . PERIPHERAL VASCULAR CATHETERIZATION N/A 07/20/2016   Procedure: A/V Shuntogram/Fistulagram;  Surgeon: Fransisco Hertz, MD;  Location: [facility] INVASIVE CV LAB;  Service: Cardiovascular;  Laterality: N/A;  . PERIPHERAL VASCULAR CATHETERIZATION N/A 07/20/2016   Procedure: Dialysis/Perma Catheter Insertion;  Surgeon: Fransisco Hertz, MD;  Location: [facility] INVASIVE CV LAB;  Service: Cardiovascular;  Laterality: N/A;  . REVISION OF ARTERIOVENOUS GORETEX GRAFT Left 06/16/2016   Procedure: REVISION OF LEFT BRACHIOCEPHALIC ARTERIOVENOUS FISTULA WITH RESECTION OF REDUNDANT SECTION;  Surgeon: Chuck Hint, MD;  Location: [facility] OR;  Service: Vascular;  Laterality: Left;     No family history on file.   Social History   Social History  . Marital status: Single    Spouse name: N/A  . Number of children: N/A  . Years of education: N/A   Occupational History  . Not on file.   Social History Main Topics  . Smoking status: Former  Smoker    Types: Cigarettes    Quit date: 03/03/2006  . Smokeless tobacco: Never Used  . Alcohol use No  . Drug use: No  . Sexual activity: No   Other Topics Concern  . Not on file   Social History Narrative   Admit to [employer] 04/10/16   Never married   Former smoker - 2007   Alcohol none   Full Code     BP 110/78   Pulse 67   Ht 5\' 4"  (1.626 m)   Physical Exam:  Well appearing NAD HEENT: Unremarkable Neck:  No JVD, no thyromegally Lymphatics:  No adenopathy Back:  No CVA tenderness Lungs:  Clear HEART:  Regular rate rhythm, no murmurs, no rubs, no clicks Abd:  soft, positive bowel sounds, no organomegally, no rebound, no guarding Ext:  2 plus pulses, no edema, no cyanosis, no clubbing Skin:  No rashes no nodules Neuro:  CN II through XII intact, motor grossly intact  DEVICE  Normal device function.  See PaceArt for details.   Assess/Plan: 1. Chronic systolic heart failure - her symptoms are class 2. She will continue her current meds. 2. PAF - we will start her amiodarone. She is not a good candidate for systemic anti-coagulation.  3. ICD - her device was reprogrammed VDD with a long AV delay. 4. VT - she has had appropriate therapy. Will start amiodarone.  Lewayne BuntingGregg Kagan Mutchler, MD

## 2016-10-26 ENCOUNTER — Non-Acute Institutional Stay (SKILLED_NURSING_FACILITY): Payer: Medicare PPO | Admitting: Family

## 2016-10-26 DIAGNOSIS — N186 End stage renal disease: Secondary | ICD-10-CM

## 2016-10-26 DIAGNOSIS — I5042 Chronic combined systolic (congestive) and diastolic (congestive) heart failure: Secondary | ICD-10-CM | POA: Diagnosis not present

## 2016-10-26 DIAGNOSIS — E1122 Type 2 diabetes mellitus with diabetic chronic kidney disease: Secondary | ICD-10-CM | POA: Diagnosis not present

## 2016-10-26 DIAGNOSIS — F329 Major depressive disorder, single episode, unspecified: Secondary | ICD-10-CM

## 2016-10-26 DIAGNOSIS — F411 Generalized anxiety disorder: Secondary | ICD-10-CM | POA: Diagnosis not present

## 2016-10-26 DIAGNOSIS — F32A Depression, unspecified: Secondary | ICD-10-CM

## 2016-10-26 DIAGNOSIS — J449 Chronic obstructive pulmonary disease, unspecified: Secondary | ICD-10-CM | POA: Diagnosis not present

## 2016-10-26 NOTE — Progress Notes (Signed)
Location:  [facility] Room Number: 305 B Place of Service:  SNF (31) Provider: Jaelene Garciagarcia FNP-C   [name], MD  Patient Care Team: [name], MD as PCP - General (Internal Medicine) Laurey Morale, MD as Consulting Physician (Cardiology) Bobbye Riggs, MD (Specialist)  Extended Emergency Contact Information Primary Emergency Contact: Haith,[name] Address: [address]          [place], [place] [address] [place] Home Phone: [phone] Relation: Sister Secondary Emergency Contact: Marcello Moores, [place] Home Phone: [phone] Relation: Sister  Code Status: Full Code  Goals of care: Advanced Directive information Advanced Directives 09/16/2016  Does Patient Have a Medical Advance Directive? No  Type of Advance Directive -  Does patient want to make changes to medical advance directive? -  Copy of Healthcare Power of Attorney in Chart? -  Would patient like information on creating a medical advance directive? -     Chief Complaint  Patient presents with  . Medical Management of Chronic Issues    HPI:  Pt is a 65 y.o. female seen today at [facility]  for medical management of chronic diseases.She has a medical history of Type 2 DM, COPD, CAD, CHF, ESRD on dialysis, PVD, Depression among other conditions. She is seen in her room today.she continues with three times per week dialysis. She denies any acute issues this visit. She continues to require assistance with her ADL's. She has had no recent hospitalization since last visit though she very high risk for readmission. Her SBP stable in the 90's on dialysis.she was [facility] Dr[facility] Ladona Ridgel on 10/11/2016 ICD evaluation follow up appointment in 6 months. Facility Nurse reports no new concerns.        Past Medical History:  Diagnosis Date  . AICD (automatic cardioverter/defibrillator) present    Biotronik Inventra 7 VR-T DX 04/12/15  . Anemia   . Anxiety   . C. difficile colitis   . Cardiomyopathy (HCC)    02/02/16 ([facility]): Mixed ischemic and non-ischemic cardiomyopathy  . CHF (congestive heart failure) (HCC)    systolic  . COPD (chronic obstructive pulmonary disease) (HCC)   . Coronary artery disease   . Diabetes mellitus (HCC)   . DVT (deep venous thrombosis) (HCC)    on coumadin  . ESRD (end stage renal disease) (HCC)    tues/thurs/sat dialysis  . Hypertension   . Neuropathy (HCC)   . Obstructive sleep apnea    no cpap  . Renal disorder   . Wears glasses    Past Surgical History:  Procedure Laterality Date  . AV FISTULA PLACEMENT Left 04/03/2016   Procedure: ARTERIOVENOUS (AV) FISTULA CREATION;  Surgeon: Larina Earthly, MD;  Location: [facility] OR;  Service: Vascular;  Laterality: Left;  . AV FISTULA PLACEMENT Right 08/21/2016   Procedure: INSERTION RIGHT ARM ARTERIOVENOUS GRAFT USING 4-7MM X45  CM ACUSEAL GRAFT;  Surgeon: Maeola Harman, MD;  Location: [facility] OR;  Service: Vascular;  Laterality: Right;  . CORONARY ANGIOPLASTY     LCX stent 2010; by [facility] 01/2016 note: 03/2014 LHC/RHC: minor luminal irregularities LAD and RCA with patent stent first marginal branch of CX. RA 11, PAP 58/22 with mean 39, mean wedge pressure 19, LVEDP 27, PVR 3.9, CO 5.1, Cardiac index 2.5.   . FISTULA SUPERFICIALIZATION Left 06/16/2016   Procedure: SUPERFICIALIZATION OF  LEFT ARM BRACHIOCEPHALIC ARTERIOVENOUS FISTULA;  Surgeon: Chuck Hinthristopher S Dickson, MD;  [facility];  Service: Vascular;  Laterality: Left;  . INSERTION OF DIALYSIS CATHETER Left 04/03/2016   Procedure: INSERTION OF DIALYSIS CATHETER;  Surgeon: Larina Earthlyodd F Early, MD;  [facility];  Service: Vascular;  Laterality: Left;  . LIGATION OF COMPETING BRANCHES OF ARTERIOVENOUS FISTULA Left 06/16/2016   Procedure: LIGATION OF COMPETING BRANCHES OF left brachiocephalic ARTERIOVENOUS FISTULA;  Surgeon: Chuck Hinthristopher S Dickson, MD;   [facility];  Service: Vascular;  Laterality: Left;  . PERIPHERAL VASCULAR CATHETERIZATION N/A 07/20/2016   Procedure: A/V Shuntogram/Fistulagram;  Surgeon: Fransisco HertzBrian L Chen, MD;  [facility];  Service: Cardiovascular;  Laterality: N/A;  . PERIPHERAL VASCULAR CATHETERIZATION N/A 07/20/2016   Procedure: Dialysis/Perma Catheter Insertion;  Surgeon: Fransisco HertzBrian L Chen, MD;  Location: [facility] INVASIVE CV LAB;  Service: Cardiovascular;  Laterality: N/A;  . REVISION OF ARTERIOVENOUS GORETEX GRAFT Left 06/16/2016   Procedure: REVISION OF LEFT BRACHIOCEPHALIC ARTERIOVENOUS FISTULA WITH RESECTION OF REDUNDANT SECTION;  Surgeon: Chuck Hinthristopher S Dickson, MD;  [facility];  Service: Vascular;  Laterality: Left;    Allergies  Allergen Reactions  . Entresto [Sacubitril-Valsartan] Other (See Comments)     unknown reaction  . Latex Itching  . Sulfa Antibiotics Itching      Medication List       Accurate as of 10/26/16  5:23 PM. Always use your most recent med list.          acetaminophen 325 MG tablet Commonly known as:  TYLENOL Take 650 mg by mouth every 6 (six) hours as needed for mild pain or fever (For fever >99.5).   amiodarone 200 MG tablet Commonly known as:  PACERONE Take 1 tablet (200 mg total) by mouth daily.   atorvastatin 10 MG tablet Commonly known as:  LIPITOR Take 10 mg by mouth at bedtime.   CERTAVITE/ANTIOXIDANTS Tabs Take 1 tablet by mouth at bedtime.   famotidine 20 MG tablet Commonly known as:  PEPCID Take 1 tablet (20 mg total) by mouth at bedtime.   FIBER CHOICE PO Take 2 tablets by mouth daily as needed.   fluticasone 110 MCG/ACT inhaler Commonly known as:  FLOVENT HFA Inhale 2 puffs into the lungs 2 (two) times daily.   hydrOXYzine 50 MG tablet Commonly known as:  ATARAX/VISTARIL Take 50 mg by mouth every 6 (six) hours as needed for itching.   insulin glargine 100 UNIT/ML injection Commonly known as:  LANTUS Inject 8 Units into the skin daily.    insulin lispro 100 UNIT/ML injection Commonly known as:  HUMALOG Inject 0-10 Units into the skin 3 (three) times daily as needed for high blood sugar (CBG >180). CBG 0-59 Hypoglycemic Protocol, 60-149 0 units 150-250 5 units, 251-300 8 units, 301-350 10 units, >350 call MD   loperamide 2 MG capsule Commonly known as:  IMODIUM Take 1 capsule (2 mg total) by mouth 4 (four) times daily as needed for diarrhea or loose stools.   LORazepam 0.5 MG tablet Commonly known as:  ATIVAN Take 1 tablet (0.5 mg total) by mouth 2 (two) times daily as needed for anxiety.   metoprolol succinate 25 MG 24 hr tablet Commonly known as:  TOPROL-XL Take 12.5 mg by mouth 2 (two) times daily. HOLD for SBP <110 and HP <60   mirtazapine 15 MG tablet Commonly known as:  REMERON Take 15 mg by mouth at bedtime.   nitroGLYCERIN 0.4 MG SL tablet Commonly known as:  NITROSTAT  Place 0.4 mg under the tongue every 5 (five) minutes as needed for chest pain.   oxyCODONE-acetaminophen 5-325 MG tablet Commonly known as:  PERCOCET/ROXICET Take one tablet by mouth every 6 hours as needed for mild pain; Take two tablets by mouth every 6 hours as needed for severe pain. Do not exceed 4gm of Tylenol in 24 hours   OXYGEN Inhale 2 L into the lungs as needed.   PROAIR HFA 108 (90 Base) MCG/ACT inhaler Generic drug:  albuterol Inhale 2 puffs into the lungs every 6 (six) hours as needed for wheezing or shortness of breath.   sertraline 25 MG tablet Commonly known as:  ZOLOFT Take 25 mg by mouth daily.       Review of Systems  Constitutional: Negative for activity change, appetite change, chills, fatigue and fever.  HENT: Negative for congestion, rhinorrhea, sinus pressure, sneezing and sore throat.   Eyes: Negative.   Respiratory: Negative for cough, chest tightness, shortness of breath and wheezing.   Cardiovascular: Negative for chest pain, palpitations and leg swelling.  Gastrointestinal: Negative for abdominal  distention, abdominal pain, constipation, diarrhea, nausea and vomiting.       Abdominal Hernia.   Endocrine: Negative.   Genitourinary: Negative for dysuria, frequency and urgency.  Musculoskeletal: Positive for gait problem.  Skin: Negative for color change, pallor and rash.       AV fistula   Neurological: Negative for dizziness, seizures, syncope, light-headedness, numbness and headaches.  Hematological: Does not bruise/bleed easily.  Psychiatric/Behavioral: Negative for agitation, confusion, hallucinations, sleep disturbance and suicidal ideas. The patient is not nervous/anxious.     Immunization History  Administered Date(s) Administered  . Influenza-Unspecified 08/17/2016  . PPD Test 03/01/2016, 04/10/2016   Pertinent  Health Maintenance Due  Topic Date Due  . FOOT EXAM  11/27/2016 (Originally 07/23/1961)  . MAMMOGRAM  11/27/2016 (Originally 07/23/2000)  . OPHTHALMOLOGY EXAM  11/27/2016 (Originally 07/23/1961)  . URINE MICROALBUMIN  11/27/2016 (Originally 07/23/1961)  . DEXA SCAN  11/27/2016 (Originally 07/24/2015)  . COLONOSCOPY  11/27/2016 (Originally 07/23/2000)  . PNA vac Low Risk Adult (1 of 2 - PCV13) 11/27/2016 (Originally 07/24/2015)  . HEMOGLOBIN A1C  01/24/2017  . INFLUENZA VACCINE  Completed      Vitals:   10/26/16 1716  BP: (!) 95/50  Pulse: 72  Resp: 18  SpO2: 93%  Weight: 163 lb 11.2 oz (74.3 kg)  Height: 5\' 4"  (1.626 m)   Body mass index is 28.1 kg/m. Physical Exam  Constitutional: She is oriented to person, place, and time. She appears well-developed and well-nourished. No distress.  HENT:  Head: Normocephalic.  Mouth/Throat: Oropharynx is clear and moist. No oropharyngeal exudate.  Eyes: Conjunctivae and EOM are normal. Pupils are equal, round, and reactive to light. Right eye exhibits no discharge. Left eye exhibits no discharge. No scleral icterus.  Neck: Normal range of motion. No JVD present. No thyromegaly present.  Cardiovascular: Normal rate,  regular rhythm, normal heart sounds and intact distal pulses.  Exam reveals no gallop and no friction rub.   No murmur heard. Pulmonary/Chest: Effort normal and breath sounds normal. No respiratory distress. She has no wheezes. She has no rales.  Abdominal: Soft. Bowel sounds are normal. She exhibits no distension. There is no rebound and no guarding.  Reducible umbilical Hernia .   Genitourinary:  Genitourinary Comments: On Hemo dialysis x 3 per week via AV fistula. Site without any signs of infections.    Musculoskeletal: She exhibits no edema, tenderness or deformity.  Spends most time in the bed. Uses wheelchair with assist.   Lymphadenopathy:    She has no cervical adenopathy.  Neurological: She is oriented to person, place, and time.  Skin: Skin is warm and dry. No rash noted. No erythema. No pallor.  AV fistula. Right groin Hemodialysis access site dry clean and intact.   Psychiatric: She has a normal mood and affect.    Labs reviewed:  Recent Labs  04/03/16 1423  05/08/16 0436  07/15/16 1800 07/16/16 0545 07/17/16 1400  07/22/16 1109  08/26/16 2046 09/16/16 1232 09/23/16 1749  NA  --   < > 137  < > 135 135 136  < > 135  < > 137 135 135  K  --   < > 3.5  < > 4.3 4.0 4.1  < > 3.5  < > 3.6 3.5 3.0*  CL  --   < > 103  < > 97* 97* 100*  < > 102  --  100* 97* 98*  CO2  --   < > 25  < > 25 26 29   < > 24  --  26 27 25   GLUCOSE  --   < > 76  < > 152* 230* 124*  < > 64*  < > 118* 73 89  BUN  --   < > 28*  < > 40* 26* 40*  < > 12  < > 14 47* 13  CREATININE  --   < > 6.52*  < > 6.36* 4.62* 5.64*  < > 2.74*  < > 3.02* 4.90* 2.03*  CALCIUM  --   < > 8.0*  < > 8.5* 8.6* 8.6*  < > 8.2*  --  8.1* 8.8* 8.1*  MG 2.1  --  1.9  --  2.2  --   --   --   --   --   --   --   --   PHOS  --   < >  --   < > 4.7* 4.3 4.3  --  1.7*  --   --   --   --   < > = values in this interval not displayed.  Recent Labs  05/06/16 1240  07/22/16 1109 07/26/16 08/26/16 2046 09/16/16 1232  AST 18  < >   --  17 25 17   ALT 11*  < >  --  12 12* 12*  ALKPHOS 180*  < >  --  179* 194* 182*  BILITOT 1.5*  --   --   --  0.7 0.8  PROT 7.3  --   --   --  8.0 8.7*  ALBUMIN 2.4*  < > 2.0*  --  1.8* 1.9*  < > = values in this interval not displayed.  Recent Labs  08/26/16 2046 09/16/16 1232 09/23/16 1749  WBC 19.4* 13.3* 8.8  NEUTROABS 16.4* 10.1* 5.9  HGB 9.2* 10.0* 9.9*  HCT 30.5* 32.5* 32.7*  MCV 83.3 81.7 83.2  PLT 258 342 PLATELET CLUMPS NOTED ON SMEAR, COUNT APPEARS ADEQUATE   Lab Results  Component Value Date   TSH 1.993 03/22/2016   Lab Results  Component Value Date   HGBA1C 5.9 07/26/2016   Lab Results  Component Value Date   CHOL 49 03/17/2016   HDL 14 (A) 03/17/2016   LDLCALC 21 03/17/2016   TRIG 68 03/17/2016    Assessment/Plan 1. Chronic combined systolic and diastolic CHF (congestive heart failure) Stable.continue to manage fluid with dialysis. Monitor  weight. Continue on fluid restrictions.   2. Chronic obstructive pulmonary disease, unspecified COPD type  Breathing stable. Continue on Albuterol, fluticasone,flovent and Spiriva. Continue on oxygen 2 liters via [place].    3. 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     4. Depression Stable. Continue on Zoloft and Remeron. Continue to follow up with Psych services.   5. Generalized anxiety disorder Continue on Ativan.   Family/ staff Communication: Reviewed plan of care with patient and facility Nurse supervisor.   Labs/tests ordered: None

## 2016-11-16 LAB — CBC AND DIFFERENTIAL
HCT: 35 % — AB (ref 36–46)
WBC: 8.6 10^3/mL

## 2016-11-16 LAB — HEPATIC FUNCTION PANEL: Alkaline Phosphatase: 172 U/L — AB (ref 25–125)

## 2016-11-16 LAB — BASIC METABOLIC PANEL
Glucose: 102 mg/dL
POTASSIUM: 4.1 mmol/L (ref 3.4–5.3)
Sodium: 139 mmol/L (ref 137–147)

## 2016-11-22 ENCOUNTER — Telehealth: Payer: Self-pay | Admitting: Vascular Surgery

## 2016-11-22 NOTE — Telephone Encounter (Signed)
Sched appt 11/24/16 at 11:45. Lm w/ [name]. Spoke to the pt's sister [name] to let them know the pt needs a family member with her.

## 2016-11-22 NOTE — Telephone Encounter (Signed)
-----   Message from Phillips Odor, RN sent at 11/22/2016 12:14 PM EST ----- Regarding: needs appt with Dr. Randie Heinz Per Dr. Randie Heinz, schedule OV within next 2 weeks for clotted Right AVG; referred by Dr. Juel Burrow @ [facility].  Due to a previous challenge in explaining need for specific treatment, Dr. Randie Heinz would like to have a family member come with her, to the appt.

## 2016-11-23 ENCOUNTER — Non-Acute Institutional Stay (SKILLED_NURSING_FACILITY): Payer: Medicare PPO | Admitting: Family

## 2016-11-23 ENCOUNTER — Encounter: Payer: Self-pay | Admitting: Family

## 2016-11-23 DIAGNOSIS — J449 Chronic obstructive pulmonary disease, unspecified: Secondary | ICD-10-CM

## 2016-11-23 DIAGNOSIS — I5042 Chronic combined systolic (congestive) and diastolic (congestive) heart failure: Secondary | ICD-10-CM | POA: Diagnosis not present

## 2016-11-23 DIAGNOSIS — N186 End stage renal disease: Secondary | ICD-10-CM

## 2016-11-23 DIAGNOSIS — E1122 Type 2 diabetes mellitus with diabetic chronic kidney disease: Secondary | ICD-10-CM

## 2016-11-23 MED ORDER — IPRATROPIUM-ALBUTEROL 0.5-2.5 (3) MG/3ML IN SOLN: 3.0000 mL | Freq: Four times a day (QID) | RESPIRATORY_TRACT | Status: DC

## 2016-11-23 NOTE — Progress Notes (Signed)
Location:  [facility] Room Number: 305-B Place of Service:  SNF (31) Provider: Dimitrius Steedman FNP-C   [name], MD  Patient Care Team: [name], MD as PCP - General (Internal Medicine) Laurey Morale, MD as Consulting Physician (Cardiology) Bobbye Riggs, MD (Specialist)  Extended Emergency Contact Information Primary Emergency Contact: Haith,[name] Address: [address]          [place], [place] [address] [place] Home Phone: [phone] Relation: Sister Secondary Emergency Contact: Marcello Moores, [place] Home Phone: [phone] Relation: Sister  Code Status:  Full Code  Goals of care: Advanced Directive information Advanced Directives 09/16/2016  Does Patient Have a Medical Advance Directive? No  Type of Advance Directive -  Does patient want to make changes to medical advance directive? -  Copy of Healthcare Power of Attorney in Chart? -  Would patient like information on creating a medical advance directive? -     Chief Complaint  Patient presents with  . Medical Management of Chronic Issues    HPI:  Pt is a 65 y.o. female seen today at [facility] for medical management of chronic issues.She has a medical history of Type 2 DM, COPD, CAD, CHF, ESRD, PVD, Depression among other conditions. She is seen in her room today. She complains of worsening productive cough for the past one week. CXR done in the past week was negative for pneumonia.She skipped her dialysis due to not feeling well. She denies any fever, chills or shortness of breath. She has used Robitussin DM and her ventolin inhaler without any relief.She was seen by Vascular surgery 11/22/2016 had a Thrombectomy of her right upper arm AVG dialysis site without any complications.Rigth upper arm sutures to be removed with her next dialysis day. Dialysis place notified by Nurse supervisor.Facility staff  reports no new concerns.    Past Medical History:  Diagnosis Date  . AICD (automatic cardioverter/defibrillator) present    Biotronik Inventra 7 VR-T DX 04/12/15  . Anemia   . Anxiety   . C. difficile colitis   . Cardiomyopathy (HCC)    02/02/16 ([facility]): Mixed ischemic and non-ischemic cardiomyopathy  . CHF (congestive heart failure) (HCC)    systolic  . COPD (chronic obstructive pulmonary disease) (HCC)   . Coronary artery disease   . Diabetes mellitus (HCC)   . DVT (deep venous thrombosis) (HCC)    on coumadin  . ESRD (end stage renal disease) (HCC)    tues/thurs/sat dialysis  . Hypertension   . Neuropathy (HCC)   . Obstructive sleep apnea    no cpap  . Renal disorder   . Wears glasses    Past Surgical History:  Procedure Laterality Date  . AV FISTULA PLACEMENT Left 04/03/2016   Procedure: ARTERIOVENOUS (AV) FISTULA CREATION;  Surgeon: Larina Earthly, MD;  Location: [facility] OR;  Service: Vascular;  Laterality: Left;  . AV FISTULA PLACEMENT Right 08/21/2016   Procedure: INSERTION RIGHT ARM ARTERIOVENOUS GRAFT USING 4-7MM X45  CM ACUSEAL GRAFT;  Surgeon: Maeola Harman, MD;  Location: [facility] OR;  Service: Vascular;  Laterality: Right;  . CORONARY ANGIOPLASTY     LCX stent 2010; by [facility] 01/2016 note: 03/2014 LHC/RHC: minor luminal irregularities LAD and RCA with patent stent first marginal branch of CX. RA 11, PAP 58/22 with mean 39, mean wedge pressure 19, LVEDP 27, PVR 3.9,  CO 5.1, Cardiac index 2.5.   . FISTULA SUPERFICIALIZATION Left 06/16/2016   Procedure: SUPERFICIALIZATION OF LEFT ARM BRACHIOCEPHALIC ARTERIOVENOUS FISTULA;  Surgeon: Chuck Hinthristopher S Dickson, MD;  [facility];  Service: Vascular;  Laterality: Left;  . INSERTION OF DIALYSIS CATHETER Left 04/03/2016   Procedure: INSERTION OF DIALYSIS CATHETER;  Surgeon: Larina Earthlyodd F Early, MD;  [facility];  Service: Vascular;  Laterality: Left;  . LIGATION OF COMPETING BRANCHES OF ARTERIOVENOUS FISTULA Left  06/16/2016   Procedure: LIGATION OF COMPETING BRANCHES OF left brachiocephalic ARTERIOVENOUS FISTULA;  Surgeon: Chuck Hinthristopher S Dickson, MD;  [facility];  Service: Vascular;  Laterality: Left;  . PERIPHERAL VASCULAR CATHETERIZATION N/A 07/20/2016   Procedure: A/V Shuntogram/Fistulagram;  Surgeon: Fransisco HertzBrian L Chen, MD;  [facility];  Service: Cardiovascular;  Laterality: N/A;  . PERIPHERAL VASCULAR CATHETERIZATION N/A 07/20/2016   Procedure: Dialysis/Perma Catheter Insertion;  Surgeon: Fransisco HertzBrian L Chen, MD;  Location: [facility] INVASIVE CV LAB;  Service: Cardiovascular;  Laterality: N/A;  . REVISION OF ARTERIOVENOUS GORETEX GRAFT Left 06/16/2016   Procedure: REVISION OF LEFT BRACHIOCEPHALIC ARTERIOVENOUS FISTULA WITH RESECTION OF REDUNDANT SECTION;  Surgeon: Chuck Hinthristopher S Dickson, MD;  [facility];  Service: Vascular;  Laterality: Left;    Allergies  Allergen Reactions  . Entresto [Sacubitril-Valsartan] Other (See Comments)     unknown reaction  . Latex Itching  . Sulfa Antibiotics Itching    Allergies as of 11/23/2016      Reactions   Entresto [sacubitril-valsartan] Other (See Comments)    unknown reaction   Latex Itching   Sulfa Antibiotics Itching      Medication List       Accurate as of 11/23/16  1:51 PM. Always use your most recent med list.          acetaminophen 325 MG tablet Commonly known as:  TYLENOL Take 650 mg by mouth every 6 (six) hours as needed for mild pain or fever (For fever >99.5).   amiodarone 200 MG tablet Commonly known as:  PACERONE Take 1 tablet (200 mg total) by mouth daily.   atorvastatin 10 MG tablet Commonly known as:  LIPITOR Take 10 mg by mouth at bedtime.   CERTAVITE/ANTIOXIDANTS Tabs Take 1 tablet by mouth at bedtime.   famotidine 20 MG tablet Commonly known as:  PEPCID Take 1 tablet (20 mg total) by mouth at bedtime.   FIBER CHOICE PO Take 2 tablets by mouth daily as needed (Constipation).   fluticasone 110 MCG/ACT  inhaler Commonly known as:  FLOVENT HFA Inhale 2 puffs into the lungs 2 (two) times daily.   hydrOXYzine 50 MG tablet Commonly known as:  ATARAX/VISTARIL Take 50 mg by mouth every 6 (six) hours as needed for itching.   insulin glargine 100 UNIT/ML injection Commonly known as:  LANTUS Inject 8 Units into the skin daily.   insulin lispro 100 UNIT/ML injection Commonly known as:  HUMALOG Inject 0-10 Units into the skin 3 (three) times daily as needed for high blood sugar (CBG >180). CBG 0-59 Hypoglycemic Protocol, 60-149 0 units 150-250 5 units, 251-300 8 units, 301-350 10 units, >350 call MD   lactose free nutrition Liqd Take 237 mLs by mouth daily. At 9PM   loperamide 2 MG capsule Commonly known as:  IMODIUM Take 1 capsule (2 mg total) by mouth 4 (four) times daily as needed for diarrhea or loose stools.   LORazepam 0.5 MG tablet Commonly known as:  ATIVAN Take 0.5 mg by mouth daily as needed for anxiety.  Discontinue after 2 weeks (GDR)   metoprolol succinate 25 MG 24 hr tablet Commonly known as:  TOPROL-XL Take 12.5 mg by mouth 2 (two) times daily. HOLD for SBP <110 and HP <60   mirtazapine 15 MG tablet Commonly known as:  REMERON Take 15 mg by mouth at bedtime.   nitroGLYCERIN 0.4 MG SL tablet Commonly known as:  NITROSTAT Place 0.4 mg under the tongue every 5 (five) minutes as needed for chest pain.   oxyCODONE-acetaminophen 5-325 MG tablet Commonly known as:  PERCOCET/ROXICET Take one tablet by mouth every 6 hours as needed for mild pain; Take two tablets by mouth every 6 hours as needed for severe pain. Do not exceed 4gm of Tylenol in 24 hours   OXYGEN Inhale 2 L into the lungs as needed.   PROAIR HFA 108 (90 Base) MCG/ACT inhaler Generic drug:  albuterol Inhale 2 puffs into the lungs every 6 (six) hours as needed for wheezing or shortness of breath.   ROBITUSSIN DM 10-100 MG/5ML liquid Generic drug:  Dextromethorphan-Guaifenesin Take 10 mLs by mouth every 6  (six) hours.   sertraline 25 MG tablet Commonly known as:  ZOLOFT Take 25 mg by mouth daily.       Review of Systems  Constitutional: Negative for activity change, appetite change, chills, fatigue and fever.  HENT: Negative for congestion, rhinorrhea, sinus pressure, sneezing and sore throat.   Eyes: Negative.   Respiratory: Positive for cough. Negative for chest tightness, shortness of breath and wheezing.   Cardiovascular: Negative for chest pain and palpitations.  Gastrointestinal: Negative for abdominal distention, abdominal pain, constipation, diarrhea, nausea and vomiting.       Abdominal Hernia.   Endocrine: Negative for polydipsia, polyphagia and polyuria.  Genitourinary: Negative for dysuria, frequency and urgency.  Musculoskeletal: Positive for gait problem.  Skin: Negative for color change, pallor and rash.          Neurological: Negative for dizziness, seizures, syncope, light-headedness, numbness and headaches.  Hematological: Does not bruise/bleed easily.  Psychiatric/Behavioral: Negative for agitation, confusion, hallucinations, sleep disturbance and suicidal ideas. The patient is not nervous/anxious.     Immunization History  Administered Date(s) Administered  . Influenza-Unspecified 08/17/2016  . PPD Test 03/01/2016, 04/10/2016   Pertinent  Health Maintenance Due  Topic Date Due  . FOOT EXAM  11/27/2016 (Originally 07/23/1961)  . MAMMOGRAM  11/27/2016 (Originally 07/23/2000)  . OPHTHALMOLOGY EXAM  11/27/2016 (Originally 07/23/1961)  . URINE MICROALBUMIN  11/27/2016 (Originally 07/23/1961)  . DEXA SCAN  11/27/2016 (Originally 07/24/2015)  . COLONOSCOPY  11/27/2016 (Originally 07/23/2000)  . PNA vac Low Risk Adult (1 of 2 - PCV13) 11/27/2016 (Originally 07/24/2015)  . HEMOGLOBIN A1C  01/24/2017  . INFLUENZA VACCINE  Completed      Vitals:   11/23/16 1019  BP: 122/61  Pulse: 68  Resp: 18  Temp: 97.8 F (36.6 C)  TempSrc: Oral  SpO2: 95%  Weight: 163 lb  (73.9 kg)  Height: 5\' 8"  (1.727 m)   Body mass index is 24.78 kg/m. Physical Exam  Constitutional: She is oriented to person, place, and time. She appears well-developed and well-nourished. No distress.  HENT:  Head: Normocephalic.  Mouth/Throat: Oropharynx is clear and moist. No oropharyngeal exudate.  Eyes: Conjunctivae and EOM are normal. Pupils are equal, round, and reactive to light. Right eye exhibits no discharge. Left eye exhibits no discharge. No scleral icterus.  Neck: Normal range of motion. No JVD present. No thyromegaly present.  Cardiovascular: Normal rate, regular rhythm, normal heart  sounds and intact distal pulses.  Exam reveals no gallop and no friction rub.   No murmur heard. Pulmonary/Chest: Effort normal and breath sounds normal. No respiratory distress. She has no wheezes. She has no rales.  Abdominal: Soft. Bowel sounds are normal. She exhibits no distension. There is no rebound and no guarding.  Umbilical Hernia reducible.   Genitourinary:  Genitourinary Comments: On Hemo dialysis x 3 per week   Musculoskeletal: She exhibits no edema, tenderness or deformity.  Unsteady gait uses wheelchair.   Lymphadenopathy:    She has no cervical adenopathy.  Neurological: She is oriented to person, place, and time.  Skin: Skin is warm and dry. No rash noted. No erythema. No pallor.  Right upper arm AVG site positive for bruit but negative for Thrill. S/p thrombectomy. Sutures clean dry and intact.   Psychiatric: She has a normal mood and affect.    Labs reviewed:  Recent Labs  04/03/16 1423  05/08/16 0436  07/15/16 1800 07/16/16 0545 07/17/16 1400  07/22/16 1109  08/26/16 2046 09/16/16 1232 09/23/16 1749  NA  --   < > 137  < > 135 135 136  < > 135  < > 137 135 135  K  --   < > 3.5  < > 4.3 4.0 4.1  < > 3.5  < > 3.6 3.5 3.0*  CL  --   < > 103  < > 97* 97* 100*  < > 102  --  100* 97* 98*  CO2  --   < > 25  < > 25 26 29   < > 24  --  26 27 25   GLUCOSE  --   < > 76   < > 152* 230* 124*  < > 64*  < > 118* 73 89  BUN  --   < > 28*  < > 40* 26* 40*  < > 12  < > 14 47* 13  CREATININE  --   < > 6.52*  < > 6.36* 4.62* 5.64*  < > 2.74*  < > 3.02* 4.90* 2.03*  CALCIUM  --   < > 8.0*  < > 8.5* 8.6* 8.6*  < > 8.2*  --  8.1* 8.8* 8.1*  MG 2.1  --  1.9  --  2.2  --   --   --   --   --   --   --   --   PHOS  --   < >  --   < > 4.7* 4.3 4.3  --  1.7*  --   --   --   --   < > = values in this interval not displayed.  Recent Labs  05/06/16 1240  07/22/16 1109 07/26/16 08/26/16 2046 09/16/16 1232  AST 18  < >  --  17 25 17   ALT 11*  < >  --  12 12* 12*  ALKPHOS 180*  < >  --  179* 194* 182*  BILITOT 1.5*  --   --   --  0.7 0.8  PROT 7.3  --   --   --  8.0 8.7*  ALBUMIN 2.4*  < > 2.0*  --  1.8* 1.9*  < > = values in this interval not displayed.  Recent Labs  08/26/16 2046 09/16/16 1232 09/20/16 09/23/16 1749 09/26/16  WBC 19.4* 13.3* 11.5 8.8 9.2  NEUTROABS 16.4* 10.1* 8 5.9 6  HGB 9.2* 10.0* 9.5* 9.9* 9.4*  HCT 30.5* 32.5* 31*  32.7* 31*  MCV 83.3 81.7  --  83.2  --   PLT 258 342 284 PLATELET CLUMPS NOTED ON SMEAR, COUNT APPEARS ADEQUATE  --    Lab Results  Component Value Date   TSH 1.19 09/25/2016   Lab Results  Component Value Date   HGBA1C 5.9 07/26/2016   Lab Results  Component Value Date   CHOL 49 03/17/2016   HDL 14 (A) 03/17/2016   LDLCALC 21 03/17/2016   TRIG 68 03/17/2016   Assessment/Plan COPD Has had worsening productive cough for th past one week.CXR done one week was negative. Afebrile. Bilateral lung fields wheezing with diminished bases noted. Will obtain portable CXR Pa/Lat rule out pneumonia. Add DuoNebs every 6 hours X 5 days then D/C. Continue cough syrup. Continue to monitor.   CHF No weight gain or edema. Has had worsening cough. CXR as above. Monitor weight.   Type 2 DM   CBG stable ranging in the 80's-190's with occasional 200's. Continue on Lantus and Humalog. Monitor Hgb A1C .   Family/ staff Communication:  Reviewed plan of care with patient and facility Nurse supervisor.   Labs/tests ordered: None

## 2016-11-24 ENCOUNTER — Ambulatory Visit: Payer: Medicare PPO | Admitting: Vascular Surgery

## 2016-11-30 LAB — CBC AND DIFFERENTIAL: HEMOGLOBIN: 10.3 g/dL — AB (ref 12.0–16.0)

## 2016-12-05 ENCOUNTER — Other Ambulatory Visit: Payer: Self-pay

## 2016-12-05 DIAGNOSIS — T82858S Stenosis of vascular prosthetic devices, implants and grafts, sequela: Secondary | ICD-10-CM

## 2016-12-21 ENCOUNTER — Non-Acute Institutional Stay (SKILLED_NURSING_FACILITY): Payer: Medicare PPO | Admitting: Family

## 2016-12-21 DIAGNOSIS — N186 End stage renal disease: Secondary | ICD-10-CM

## 2016-12-21 DIAGNOSIS — J449 Chronic obstructive pulmonary disease, unspecified: Secondary | ICD-10-CM | POA: Diagnosis not present

## 2016-12-21 DIAGNOSIS — E1122 Type 2 diabetes mellitus with diabetic chronic kidney disease: Secondary | ICD-10-CM

## 2016-12-21 DIAGNOSIS — E782 Mixed hyperlipidemia: Secondary | ICD-10-CM | POA: Diagnosis not present

## 2016-12-21 NOTE — Progress Notes (Signed)
Location:  [facility] Room Number: 305 B  Place of Service:  SNF (31) Provider: Loc Feinstein FNP-C   [name], MD  Patient Care Team: [name], MD as PCP - General (Internal Medicine) Laurey Morale, MD as Consulting Physician (Cardiology) Bobbye Riggs, MD (Specialist)  Extended Emergency Contact Information Primary Emergency Contact: Haith,[name] Address: [address]          [place], [place] [address] [place] Home Phone: [phone] Relation: Sister Secondary Emergency Contact: Marcello Moores, [place] Home Phone: [phone] Relation: Sister  Code Status:  Full Code  Goals of care: Advanced Directive information Advanced Directives 09/16/2016  Does Patient Have a Medical Advance Directive? No  Type of Advance Directive -  Does patient want to make changes to medical advance directive? -  Copy of Healthcare Power of Attorney in Chart? -  Would patient like information on creating a medical advance directive? -     Chief Complaint  Patient presents with  . Medical Management of Chronic Issues    HPI:  Pt is a 66 y.o. female seen today at [facility] for medical management of chronic issues.She has a medical history of Type 2 DM, COPD, CAD, CHF, ESRD, PVD, Depression among other conditions. She is seen in her room today. She complains of persistent productive cough for several weeks.She was given cough syrup.She skipped her dialysis due to not feeling well on Tuesday but states going to dialysis today.she had episodes of nausea yesterday and was treated with phenergan.this was possible due to missing dialysis.She denies any fever, chills, nausea, vomiting or shortness of breath.Facility staff reports no new concerns.    Past Medical History:  Diagnosis Date  . AICD (automatic cardioverter/defibrillator) present    Biotronik Inventra 7 VR-T DX 04/12/15    . Anemia   . Anxiety   . C. difficile colitis   . Cardiomyopathy (HCC)    02/02/16 ([facility]): Mixed ischemic and non-ischemic cardiomyopathy  . CHF (congestive heart failure) (HCC)    systolic  . COPD (chronic obstructive pulmonary disease) (HCC)   . Coronary artery disease   . Diabetes mellitus (HCC)   . DVT (deep venous thrombosis) (HCC)    on coumadin  . ESRD (end stage renal disease) (HCC)    tues/thurs/sat dialysis  . Hypertension   . Neuropathy (HCC)   . Obstructive sleep apnea    no cpap  . Renal disorder   . Wears glasses    Past Surgical History:  Procedure Laterality Date  . AV FISTULA PLACEMENT Left 04/03/2016   Procedure: ARTERIOVENOUS (AV) FISTULA CREATION;  Surgeon: Larina Earthly, MD;  Location: [facility] OR;  Service: Vascular;  Laterality: Left;  . AV FISTULA PLACEMENT Right 08/21/2016   Procedure: INSERTION RIGHT ARM ARTERIOVENOUS GRAFT USING 4-7MM X45  CM ACUSEAL GRAFT;  Surgeon: Maeola Harman, MD;  Location: [facility] OR;  Service: Vascular;  Laterality: Right;  . CORONARY ANGIOPLASTY     LCX stent 2010; by [facility] 01/2016 note: 03/2014 LHC/RHC: minor luminal irregularities LAD and RCA with patent stent first marginal branch of CX. RA 11, PAP 58/22 with mean 39, mean wedge pressure 19, LVEDP 27, PVR 3.9, CO 5.1, Cardiac index 2.5.   . FISTULA SUPERFICIALIZATION Left 06/16/2016   Procedure: SUPERFICIALIZATION OF LEFT ARM BRACHIOCEPHALIC ARTERIOVENOUS FISTULA;  Surgeon: Chuck Hint, MD;  Location: [facility] OR;  Service: Vascular;  Laterality: Left;  . INSERTION OF DIALYSIS CATHETER Left 04/03/2016   Procedure: INSERTION OF DIALYSIS CATHETER;  Surgeon: Larina Earthly, MD;  Location: [facility] OR;  Service: Vascular;  Laterality: Left;  . LIGATION OF COMPETING BRANCHES OF ARTERIOVENOUS FISTULA Left 06/16/2016   Procedure: LIGATION OF COMPETING BRANCHES OF left brachiocephalic ARTERIOVENOUS FISTULA;  Surgeon: Chuck Hint, MD;  Location: [facility] OR;  Service:  Vascular;  Laterality: Left;  . PERIPHERAL VASCULAR CATHETERIZATION N/A 07/20/2016   Procedure: A/V Shuntogram/Fistulagram;  Surgeon: Fransisco Hertz, MD;  Location: [facility] INVASIVE CV LAB;  Service: Cardiovascular;  Laterality: N/A;  . PERIPHERAL VASCULAR CATHETERIZATION N/A 07/20/2016   Procedure: Dialysis/Perma Catheter Insertion;  Surgeon: Fransisco Hertz, MD;  Location: [facility] INVASIVE CV LAB;  Service: Cardiovascular;  Laterality: N/A;  . REVISION OF ARTERIOVENOUS GORETEX GRAFT Left 06/16/2016   Procedure: REVISION OF LEFT BRACHIOCEPHALIC ARTERIOVENOUS FISTULA WITH RESECTION OF REDUNDANT SECTION;  Surgeon: Chuck Hint, MD;  Location: [facility] OR;  Service: Vascular;  Laterality: Left;    Allergies  Allergen Reactions  . Entresto [Sacubitril-Valsartan] Other (See Comments)     unknown reaction  . Latex Itching  . Sulfa Antibiotics Itching    Allergies as of 12/21/2016      Reactions   Entresto [sacubitril-valsartan] Other (See Comments)    unknown reaction   Latex Itching   Sulfa Antibiotics Itching      Medication List       Accurate as of 12/21/16  2:01 PM. Always use your most recent med list.          acetaminophen 325 MG tablet Commonly known as:  TYLENOL Take 650 mg by mouth every 6 (six) hours as needed for mild pain or fever (For fever >99.5).   amiodarone 200 MG tablet Commonly known as:  PACERONE Take 1 tablet (200 mg total) by mouth daily.   atorvastatin 10 MG tablet Commonly known as:  LIPITOR Take 10 mg by mouth at bedtime.   CERTAVITE/ANTIOXIDANTS Tabs Take 1 tablet by mouth at bedtime.   famotidine 20 MG tablet Commonly known as:  PEPCID Take 1 tablet (20 mg total) by mouth at bedtime.   FIBER CHOICE PO Take 2 tablets by mouth daily as needed (Constipation).   fluticasone 110 MCG/ACT inhaler Commonly known as:  FLOVENT HFA Inhale 2 puffs into the lungs 2 (two) times daily.   hydrOXYzine 50 MG tablet Commonly known as:  ATARAX/VISTARIL Take 50 mg by  mouth every 6 (six) hours as needed for itching.   insulin glargine 100 UNIT/ML injection Commonly known as:  LANTUS Inject 8 Units into the skin daily.   insulin lispro 100 UNIT/ML injection Commonly known as:  HUMALOG Inject 0-10 Units into the skin 3 (three) times daily as needed for high blood sugar (CBG >180). CBG 0-59 Hypoglycemic Protocol, 60-149 0 units 150-250 5 units, 251-300 8 units, 301-350 10 units, >350 call MD   lactose free nutrition Liqd Take 237 mLs by mouth daily. At 9PM   loperamide 2 MG capsule Commonly known as:  IMODIUM Take 1 capsule (2 mg total) by mouth 4 (four) times daily as needed for diarrhea or loose stools.   metoprolol succinate 25 MG 24 hr tablet Commonly known as:  TOPROL-XL Take 12.5 mg by mouth 2 (two) times daily. HOLD for SBP <110 and HP <60   mirtazapine 15 MG tablet Commonly known as:  REMERON Take 15 mg by mouth at bedtime.  nitroGLYCERIN 0.4 MG SL tablet Commonly known as:  NITROSTAT Place 0.4 mg under the tongue every 5 (five) minutes as needed for chest pain.   oxyCODONE-acetaminophen 5-325 MG tablet Commonly known as:  PERCOCET/ROXICET Take one tablet by mouth every 6 hours as needed for mild pain; Take two tablets by mouth every 6 hours as needed for severe pain. Do not exceed 4gm of Tylenol in 24 hours   OXYGEN Inhale 2 L into the lungs as needed.   PROAIR HFA 108 (90 Base) MCG/ACT inhaler Generic drug:  albuterol Inhale 2 puffs into the lungs every 6 (six) hours as needed for wheezing or shortness of breath.   sertraline 25 MG tablet Commonly known as:  ZOLOFT Take 25 mg by mouth daily.       Review of Systems  Constitutional: Negative for activity change, appetite change, chills, fatigue and fever.  HENT: Negative for congestion, rhinorrhea, sinus pressure, sneezing and sore throat.   Eyes: Negative.   Respiratory: Positive for cough. Negative for chest tightness, shortness of breath and wheezing.   Cardiovascular:  Negative for chest pain and palpitations.  Gastrointestinal: Negative for abdominal distention, abdominal pain, constipation, diarrhea, nausea and vomiting.          Endocrine: Negative for polydipsia, polyphagia and polyuria.  Genitourinary: Negative for dysuria, frequency and urgency.  Musculoskeletal: Positive for gait problem.  Skin: Negative for color change, pallor and rash.          Neurological: Negative for dizziness, seizures, syncope, light-headedness, numbness and headaches.  Hematological: Does not bruise/bleed easily.  Psychiatric/Behavioral: Negative for agitation, confusion, hallucinations, sleep disturbance and suicidal ideas. The patient is not nervous/anxious.     Immunization History  Administered Date(s) Administered  . Influenza-Unspecified 08/17/2016  . PPD Test 03/01/2016, 04/10/2016   Pertinent  Health Maintenance Due  Topic Date Due  . FOOT EXAM  07/23/1960  . OPHTHALMOLOGY EXAM  07/23/1960  . URINE MICROALBUMIN  07/23/1960  . MAMMOGRAM  07/23/2000  . COLONOSCOPY  07/23/2000  . DEXA SCAN  07/24/2015  . PNA vac Low Risk Adult (1 of 2 - PCV13) 07/24/2015  . HEMOGLOBIN A1C  01/24/2017  . INFLUENZA VACCINE  Completed      Vitals:   12/21/16 1000  BP: 112/72  Pulse: 69  Resp: 18  Temp: 97 F (36.1 C)  SpO2: 96%  Weight: 165 lb (74.8 kg)  Height: 5\' 8"  (1.727 m)   Body mass index is 25.09 kg/m. Physical Exam  Constitutional: She is oriented to person, place, and time. She appears well-developed and well-nourished. No distress.  HENT:  Head: Normocephalic.  Mouth/Throat: Oropharynx is clear and moist. No oropharyngeal exudate.  Eyes: Conjunctivae and EOM are normal. Pupils are equal, round, and reactive to light. Right eye exhibits no discharge. Left eye exhibits no discharge. No scleral icterus.  Neck: Normal range of motion. No JVD present. No thyromegaly present.  Cardiovascular: Normal rate, regular rhythm, normal heart sounds and intact  distal pulses.  Exam reveals no gallop and no friction rub.   No murmur heard. Pulmonary/Chest: Effort normal. No respiratory distress. She has no wheezes.  Right lobe rales noted.   Abdominal: Soft. Bowel sounds are normal. She exhibits no distension. There is no rebound and no guarding.  Umbilical Hernia reducible.   Genitourinary:  Genitourinary Comments: On Hemo dialysis x 3 per week   Musculoskeletal: She exhibits no edema, tenderness or deformity.  Unsteady gait   Lymphadenopathy:    She has no cervical  adenopathy.  Neurological: She is oriented to person, place, and time.  Skin: Skin is warm and dry. No rash noted. No erythema. No pallor.     Psychiatric: She has a normal mood and affect.    Labs reviewed:  Recent Labs  04/03/16 1423  05/08/16 0436  07/15/16 1800 07/16/16 0545 07/17/16 1400  07/22/16 1109  08/26/16 2046 09/16/16 1232 09/23/16 1749  NA  --   < > 137  < > 135 135 136  < > 135  < > 137 135 135  K  --   < > 3.5  < > 4.3 4.0 4.1  < > 3.5  < > 3.6 3.5 3.0*  CL  --   < > 103  < > 97* 97* 100*  < > 102  --  100* 97* 98*  CO2  --   < > 25  < > 25 26 29   < > 24  --  26 27 25   GLUCOSE  --   < > 76  < > 152* 230* 124*  < > 64*  < > 118* 73 89  BUN  --   < > 28*  < > 40* 26* 40*  < > 12  < > 14 47* 13  CREATININE  --   < > 6.52*  < > 6.36* 4.62* 5.64*  < > 2.74*  < > 3.02* 4.90* 2.03*  CALCIUM  --   < > 8.0*  < > 8.5* 8.6* 8.6*  < > 8.2*  --  8.1* 8.8* 8.1*  MG 2.1  --  1.9  --  2.2  --   --   --   --   --   --   --   --   PHOS  --   < >  --   < > 4.7* 4.3 4.3  --  1.7*  --   --   --   --   < > = values in this interval not displayed.  Recent Labs  05/06/16 1240  07/22/16 1109 07/26/16 08/26/16 2046 09/16/16 1232  AST 18  < >  --  17 25 17   ALT 11*  < >  --  12 12* 12*  ALKPHOS 180*  < >  --  179* 194* 182*  BILITOT 1.5*  --   --   --  0.7 0.8  PROT 7.3  --   --   --  8.0 8.7*  ALBUMIN 2.4*  < > 2.0*  --  1.8* 1.9*  < > = values in this interval not  displayed.  Recent Labs  08/26/16 2046 09/16/16 1232 09/20/16 09/23/16 1749 09/26/16  WBC 19.4* 13.3* 11.5 8.8 9.2  NEUTROABS 16.4* 10.1* 8 5.9 6  HGB 9.2* 10.0* 9.5* 9.9* 9.4*  HCT 30.5* 32.5* 31* 32.7* 31*  MCV 83.3 81.7  --  83.2  --   PLT 258 342 284 PLATELET CLUMPS NOTED ON SMEAR, COUNT APPEARS ADEQUATE  --    Lab Results  Component Value Date   TSH 1.19 09/25/2016   Lab Results  Component Value Date   HGBA1C 5.9 07/26/2016   Lab Results  Component Value Date   CHOL 49 03/17/2016   HDL 14 (A) 03/17/2016   LDLCALC 21 03/17/2016   TRIG 68 03/17/2016   Assessment/Plan Type 2 DM   CBG ranging in the 80's-150's. Continue on Lantus and Humalog. Will recheck Hgb A1C  12/25/18 with dialysis.   COPD Afebrile. Has  had persistent  productive cough for several weeks.Exam findings negative for wheezing or shortness of breath. Right lobe rales noted. Will obtain portable CXR Pa/Lat rule out pneumonia. Continue on Albuterol and Flovent. Continue cough syrup.  Hyperlipidemia Continue on Atorvastatin. Obtain lipid panel 12/25/2016 with dialysis  Family/ staff Communication: Reviewed plan of care with patient and facility Nurse supervisor.   Labs/tests ordered: Hgb A1C, lipid panel 12/25/2016 with dialysis

## 2016-12-25 ENCOUNTER — Encounter: Payer: Self-pay | Admitting: Vascular Surgery

## 2016-12-25 LAB — LIPID PANEL
Cholesterol: 147 mg/dL (ref 0–200)
HDL: 40 mg/dL (ref 35–70)
LDL CALC: 90 mg/dL
TRIGLYCERIDES: 87 mg/dL (ref 40–160)

## 2016-12-25 LAB — HEMOGLOBIN A1C: HEMOGLOBIN A1C: 5

## 2016-12-29 ENCOUNTER — Ambulatory Visit: Payer: Medicare PPO | Admitting: Vascular Surgery

## 2017-01-03 ENCOUNTER — Ambulatory Visit (INDEPENDENT_AMBULATORY_CARE_PROVIDER_SITE_OTHER): Payer: Medicare PPO | Admitting: *Deleted

## 2017-01-03 DIAGNOSIS — Z95810 Presence of automatic (implantable) cardiac defibrillator: Secondary | ICD-10-CM | POA: Diagnosis not present

## 2017-01-03 NOTE — Progress Notes (Signed)
Remote ICD transmission.   

## 2017-01-05 ENCOUNTER — Encounter: Payer: Self-pay | Admitting: Cardiology

## 2017-01-05 LAB — CUP PACEART REMOTE DEVICE CHECK
Date Time Interrogation Session: 20180209103511
HIGH POWER IMPEDANCE MEASURED VALUE: 52 Ohm
Implantable Lead Location: 753860
Implantable Lead Serial Number: 10593199
MDC IDC LEAD IMPLANT DT: 20160516
MDC IDC MSMT LEADCHNL RA SENSING INTR AMPL: 0.6 mV
MDC IDC MSMT LEADCHNL RV IMPEDANCE VALUE: 447 Ohm
MDC IDC MSMT LEADCHNL RV PACING THRESHOLD AMPLITUDE: 0.9 V
MDC IDC MSMT LEADCHNL RV PACING THRESHOLD PULSEWIDTH: 0.4 ms
MDC IDC MSMT LEADCHNL RV SENSING INTR AMPL: 8.1 mV
MDC IDC PG IMPLANT DT: 20160516
MDC IDC STAT BRADY RV PERCENT PACED: 8 %
Pulse Gen Model: 399437
Pulse Gen Serial Number: 60859573

## 2017-01-08 LAB — HM DIABETES EYE EXAM

## 2017-01-17 ENCOUNTER — Encounter: Payer: Self-pay | Admitting: Family

## 2017-01-17 ENCOUNTER — Non-Acute Institutional Stay (SKILLED_NURSING_FACILITY): Payer: Medicare PPO | Admitting: Family

## 2017-01-17 DIAGNOSIS — R112 Nausea with vomiting, unspecified: Secondary | ICD-10-CM | POA: Diagnosis not present

## 2017-01-17 DIAGNOSIS — R1011 Right upper quadrant pain: Secondary | ICD-10-CM | POA: Diagnosis not present

## 2017-01-17 NOTE — Progress Notes (Signed)
Location:  [facility] Room Number: 305B Place of Service:  SNF (31) Provider: Martena Emanuele FNP-C   [name], MD  Patient Care Team: [name], MD as PCP - General (Internal Medicine) Laurey Morale, MD as Consulting Physician (Cardiology) Bobbye Riggs, MD (Specialist)  Extended Emergency Contact Information Primary Emergency Contact: Haith,[name] Address: [address]          [place], [place] [address] [place] of [place] Home Phone: [phone] Relation: Sister Secondary Emergency Contact: Marcello Moores, [place] Home Phone: [phone] Relation: Sister  Code Status:  Full Code  Goals of care: Advanced Directive information Advanced Directives 01/17/2017  Does Patient Have a Medical Advance Directive? No  Type of Advance Directive -  Does patient want to make changes to medical advance directive? -  Copy of Healthcare Power of Attorney in Chart? -  Would patient like information on creating a medical advance directive? -     Chief Complaint  Patient presents with  . Medical Management of Chronic Issues    Routine Visit    HPI:  Pt is a 66 y.o. female seen today at [facility] for medical management of chronic issues.She has a medical history of Type 2 DM, COPD, CAD, CHF, ESRD, PVD, Depression among other conditions. She is seen in her room today per facility Nurse request. Nurse reports patient having nausea and vomiting and complaining of right upper abdominal pain. She states vomiting yellow greenish emesis. She had Phergan suppository with relief. She was able to eat spaghhtti brought by her sister for lunch.She denies any fever, chills or diarrhea.    Past Medical History:  Diagnosis Date  . AICD (automatic cardioverter/defibrillator) present    Biotronik Inventra 7 VR-T DX 04/12/15  . Anemia   . Anxiety   . C. difficile colitis   . Cardiomyopathy (HCC)    02/02/16 ([facility]): Mixed ischemic and non-ischemic cardiomyopathy  . CHF (congestive heart failure) (HCC)    systolic  . COPD (chronic obstructive pulmonary disease) (HCC)   . Coronary artery disease   . Diabetes mellitus (HCC)   . DVT (deep venous thrombosis) (HCC)    on coumadin  . ESRD (end stage renal disease) (HCC)    tues/thurs/sat dialysis  . Hypertension   . Neuropathy (HCC)   . Obstructive sleep apnea    no cpap  . Renal disorder   . Wears glasses    Past Surgical History:  Procedure Laterality Date  . AV FISTULA PLACEMENT Left 04/03/2016   Procedure: ARTERIOVENOUS (AV) FISTULA CREATION;  Surgeon: Larina Earthly, MD;  Location: [facility] OR;  Service: Vascular;  Laterality: Left;  . AV FISTULA PLACEMENT Right 08/21/2016   Procedure: INSERTION RIGHT ARM ARTERIOVENOUS GRAFT USING 4-7MM X45  CM ACUSEAL GRAFT;  Surgeon: Maeola Harman, MD;  Location: [facility] OR;  Service: Vascular;  Laterality: Right;  . CORONARY ANGIOPLASTY     LCX stent 2010; by [facility] 01/2016 note: 03/2014 LHC/RHC: minor luminal irregularities LAD and RCA with patent stent first marginal branch of CX. RA 11, PAP 58/22 with mean 39, mean wedge pressure 19, LVEDP 27, PVR 3.9, CO 5.1, Cardiac index 2.5.   . FISTULA SUPERFICIALIZATION Left 06/16/2016   Procedure: SUPERFICIALIZATION OF LEFT ARM BRACHIOCEPHALIC ARTERIOVENOUS FISTULA;  Surgeon: Chuck Hint, MD;  Location: [facility] OR;  Service: Vascular;  Laterality: Left;  .  INSERTION OF DIALYSIS CATHETER Left 04/03/2016   Procedure: INSERTION OF DIALYSIS CATHETER;  Surgeon: Larina Earthly, MD;  Location: [facility] OR;  Service: Vascular;  Laterality: Left;  . LIGATION OF COMPETING BRANCHES OF ARTERIOVENOUS FISTULA Left 06/16/2016   Procedure: LIGATION OF COMPETING BRANCHES OF left brachiocephalic ARTERIOVENOUS FISTULA;  Surgeon: Chuck Hint, MD;  Location: [facility] OR;  Service: Vascular;  Laterality: Left;  . PERIPHERAL VASCULAR CATHETERIZATION N/A 07/20/2016    Procedure: A/V Shuntogram/Fistulagram;  Surgeon: Fransisco Hertz, MD;  Location: [facility] INVASIVE CV LAB;  Service: Cardiovascular;  Laterality: N/A;  . PERIPHERAL VASCULAR CATHETERIZATION N/A 07/20/2016   Procedure: Dialysis/Perma Catheter Insertion;  Surgeon: Fransisco Hertz, MD;  Location: [facility] INVASIVE CV LAB;  Service: Cardiovascular;  Laterality: N/A;  . REVISION OF ARTERIOVENOUS GORETEX GRAFT Left 06/16/2016   Procedure: REVISION OF LEFT BRACHIOCEPHALIC ARTERIOVENOUS FISTULA WITH RESECTION OF REDUNDANT SECTION;  Surgeon: Chuck Hint, MD;  Location: [facility] OR;  Service: Vascular;  Laterality: Left;    Allergies  Allergen Reactions  . Entresto [Sacubitril-Valsartan] Other (See Comments)     unknown reaction  . Latex Itching  . Sulfa Antibiotics Itching    Allergies as of 01/17/2017      Reactions   Entresto [sacubitril-valsartan] Other (See Comments)    unknown reaction   Latex Itching   Sulfa Antibiotics Itching      Medication List       Accurate as of 01/17/17 11:15 AM. Always use your most recent med list.          acetaminophen 325 MG tablet Commonly known as:  TYLENOL Take 650 mg by mouth every 6 (six) hours as needed for mild pain or fever (For fever >99.5).   amiodarone 200 MG tablet Commonly known as:  PACERONE Take 1 tablet (200 mg total) by mouth daily.   atorvastatin 10 MG tablet Commonly known as:  LIPITOR Take 10 mg by mouth at bedtime.   CERTAVITE/ANTIOXIDANTS Tabs Take 1 tablet by mouth at bedtime.   famotidine 20 MG tablet Commonly known as:  PEPCID Take 1 tablet (20 mg total) by mouth at bedtime.   FIBER CHOICE PO Take 2 tablets by mouth daily as needed (Constipation).   fluticasone 110 MCG/ACT inhaler Commonly known as:  FLOVENT HFA Inhale 2 puffs into the lungs 2 (two) times daily.   hydrOXYzine 50 MG tablet Commonly known as:  ATARAX/VISTARIL Take 50 mg by mouth every 6 (six) hours as needed for itching.   insulin glargine 100 UNIT/ML  injection Commonly known as:  LANTUS Inject 8 Units into the skin every morning.   insulin lispro 100 UNIT/ML injection Commonly known as:  HUMALOG Inject 0-10 Units into the skin 3 (three) times daily as needed for high blood sugar (CBG >180). CBG 0-59 Hypoglycemic Protocol, 60-149 0 units 150-250 5 units, 251-300 8 units, 301-350 10 units, >350 call MD   lactose free nutrition Liqd Take 237 mLs by mouth daily. At 9PM   loperamide 2 MG capsule Commonly known as:  IMODIUM Take 1 capsule (2 mg total) by mouth 4 (four) times daily as needed for diarrhea or loose stools.   metoprolol succinate 25 MG 24 hr tablet Commonly known as:  TOPROL-XL Take 12.5 mg by mouth 2 (two) times daily. HOLD for SBP <110 and HP <60   mirtazapine 15 MG tablet Commonly known as:  REMERON Take 15 mg by mouth at bedtime.   nitroGLYCERIN 0.4 MG SL tablet Commonly known as:  NITROSTAT Place  0.4 mg under the tongue every 5 (five) minutes as needed for chest pain.   oxyCODONE-acetaminophen 5-325 MG tablet Commonly known as:  PERCOCET/ROXICET Take one tablet by mouth every 6 hours as needed for mild pain; Take two tablets by mouth every 6 hours as needed for severe pain. Do not exceed 4gm of Tylenol in 24 hours   OXYGEN Inhale 2 L into the lungs as needed.   PROAIR HFA 108 (90 Base) MCG/ACT inhaler Generic drug:  albuterol Inhale 2 puffs into the lungs every 6 (six) hours as needed for wheezing or shortness of breath.   sertraline 25 MG tablet Commonly known as:  ZOLOFT Take 25 mg by mouth daily.   SYSTANE BALANCE OP Place 1 drop into both eyes 2 (two) times daily.       Review of Systems  Constitutional: Negative for activity change, appetite change, chills, fatigue and fever.  HENT: Negative for congestion, rhinorrhea, sinus pressure, sneezing and sore throat.   Eyes: Negative.   Respiratory: Negative for cough, chest tightness, shortness of breath and wheezing.   Cardiovascular: Negative for  chest pain and palpitations.  Gastrointestinal: Positive for nausea and vomiting. Negative for abdominal distention, constipation and diarrhea.       Right upper quadrant abdominal pain    Endocrine: Negative for polydipsia, polyphagia and polyuria.  Genitourinary: Negative for dysuria, frequency and urgency.  Musculoskeletal: Positive for gait problem.  Skin: Negative for color change, pallor and rash.          Neurological: Negative for dizziness, seizures, syncope, light-headedness, numbness and headaches.  Hematological: Does not bruise/bleed easily.  Psychiatric/Behavioral: Negative for agitation, confusion, hallucinations, sleep disturbance and suicidal ideas. The patient is not nervous/anxious.     Immunization History  Administered Date(s) Administered  . Influenza, High Dose Seasonal PF 08/17/2016  . PPD Test 03/01/2016, 04/10/2016  . Pneumococcal-Unspecified 01/16/2017   Pertinent  Health Maintenance Due  Topic Date Due  . URINE MICROALBUMIN  07/23/1960  . MAMMOGRAM  07/23/2000  . COLONOSCOPY  07/23/2000  . DEXA SCAN  07/24/2015  . PNA vac Low Risk Adult (1 of 2 - PCV13) 07/24/2015  . HEMOGLOBIN A1C  01/24/2017  . FOOT EXAM  08/23/2017  . OPHTHALMOLOGY EXAM  01/08/2018  . INFLUENZA VACCINE  Completed      Vitals:   01/17/17 1026  BP: (!) 143/68  Pulse: 81  Resp: 20  Temp: 97.9 F (36.6 C)  Weight: 163 lb 2.3 oz (74 kg)  Height: 5\' 8"  (1.727 m)   Body mass index is 24.81 kg/m. Physical Exam  Constitutional: She is oriented to person, place, and time. She appears well-developed and well-nourished. No distress.  HENT:  Head: Normocephalic.  Mouth/Throat: Oropharynx is clear and moist. No oropharyngeal exudate.  Eyes: Conjunctivae and EOM are normal. Pupils are equal, round, and reactive to light. Right eye exhibits no discharge. Left eye exhibits no discharge. No scleral icterus.  Neck: Normal range of motion. No JVD present. No thyromegaly present.    Cardiovascular: Normal rate, regular rhythm, normal heart sounds and intact distal pulses.  Exam reveals no gallop and no friction rub.   No murmur heard. Pulmonary/Chest: Effort normal and breath sounds normal. No respiratory distress. She has no wheezes.     Abdominal: Soft. Bowel sounds are normal. She exhibits no distension. There is no rebound and no guarding.  Umbilical Hernia reducible. Right upper quadrant tender to deep palpation.   Genitourinary:  Genitourinary Comments: On Hemo dialysis x 3  per week   Musculoskeletal: She exhibits no edema, tenderness or deformity.  Unsteady gait   Lymphadenopathy:    She has no cervical adenopathy.  Neurological: She is oriented to person, place, and time.  Skin: Skin is warm and dry. No rash noted. No erythema. No pallor.     Psychiatric: She has a normal mood and affect.    Labs reviewed:  Recent Labs  04/03/16 1423  05/08/16 0436  07/15/16 1800 07/16/16 0545 07/17/16 1400  07/22/16 1109  08/26/16 2046 09/16/16 1232 09/23/16 1749 11/16/16  NA  --   < > 137  < > 135 135 136  < > 135  < > 137 135 135 139  K  --   < > 3.5  < > 4.3 4.0 4.1  < > 3.5  < > 3.6 3.5 3.0* 4.1  CL  --   < > 103  < > 97* 97* 100*  < > 102  --  100* 97* 98*  --   CO2  --   < > 25  < > 25 26 29   < > 24  --  26 27 25   --   GLUCOSE  --   < > 76  < > 152* 230* 124*  < > 64*  < > 118* 73 89  --   BUN  --   < > 28*  < > 40* 26* 40*  < > 12  < > 14 47* 13  --   CREATININE  --   < > 6.52*  < > 6.36* 4.62* 5.64*  < > 2.74*  < > 3.02* 4.90* 2.03*  --   CALCIUM  --   < > 8.0*  < > 8.5* 8.6* 8.6*  < > 8.2*  --  8.1* 8.8* 8.1*  --   MG 2.1  --  1.9  --  2.2  --   --   --   --   --   --   --   --   --   PHOS  --   < >  --   < > 4.7* 4.3 4.3  --  1.7*  --   --   --   --   --   < > = values in this interval not displayed.  Recent Labs  05/06/16 1240  07/22/16 1109 07/26/16 08/26/16 2046 09/16/16 1232 11/16/16  AST 18  < >  --  17 25 17   --   ALT 11*  < >  --  12  12* 12*  --   ALKPHOS 180*  < >  --  179* 194* 182* 172*  BILITOT 1.5*  --   --   --  0.7 0.8  --   PROT 7.3  --   --   --  8.0 8.7*  --   ALBUMIN 2.4*  < > 2.0*  --  1.8* 1.9*  --   < > = values in this interval not displayed.  Recent Labs  08/26/16 2046 09/16/16 1232 09/20/16 09/23/16 1749 09/26/16 11/16/16 11/30/16  WBC 19.4* 13.3* 11.5 8.8 9.2 8.6  --   NEUTROABS 16.4* 10.1* 8 5.9 6  --   --   HGB 9.2* 10.0* 9.5* 9.9* 9.4*  --  10.3*  HCT 30.5* 32.5* 31* 32.7* 31* 35*  --   MCV 83.3 81.7  --  83.2  --   --   --   PLT 258 342 284 PLATELET CLUMPS  NOTED ON SMEAR, COUNT APPEARS ADEQUATE  --   --   --    Lab Results  Component Value Date   TSH 1.19 09/25/2016   Lab Results  Component Value Date   HGBA1C 5 12/25/2016   Lab Results  Component Value Date   CHOL 147 12/25/2016   HDL 40 12/25/2016   LDLCALC 90 12/25/2016   TRIG 87 12/25/2016   Assessment/Plan  Right upper quadrant abdominal pain  RUQ tender to deep palpation .RUQ Abdominal ultra sound ordered to rule out gall stones. 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 Discussed with patient ultrasound results with option to be send to ED for evaluation. Patient preferred to follow up with GI for evaluation but if pain worsen she will go to the ED. Will continue to monitor. Refer to GI specialist ASAP for evaluation. CBC/diff, CMP 01/18/2017 with dialysis.  Nausea and vomiting  Afebrile. Has had X 1 episode of yellow greenish emesis. Phenergan suppository effective. Tolerated Spaghetti for lunch brought by her sister. 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 Discussed with patient ultrasound results with option to be send to ED for evaluation. Patient preferred to follow up with GI for evaluation but if pain worsen she will go to the ED. Refer to GI as above. Monitor vital signs every shift x 5 days. CBC/diff, CMP 01/18/2017 with dialysis.   Family/ staff Communication: Reviewed  plan of care with patient and facility Nurse supervisor.   Labs/tests ordered: CBC/diff, CMP 01/18/2017 with dialysis.

## 2017-01-19 ENCOUNTER — Encounter: Payer: Self-pay | Admitting: Vascular Surgery

## 2017-01-19 LAB — CBC AND DIFFERENTIAL
HCT: 31 % — AB (ref 36–46)
Hemoglobin: 9.1 g/dL — AB (ref 12.0–16.0)
PLATELETS: 317 10*3/uL (ref 150–399)
WBC: 7.1 10*3/mL

## 2017-01-19 LAB — HEPATIC FUNCTION PANEL
ALT: 6 U/L — AB (ref 7–35)
AST: 11 U/L — AB (ref 13–35)
Alkaline Phosphatase: 198 U/L — AB (ref 25–125)
Bilirubin, Total: 0.5 mg/dL

## 2017-01-19 LAB — BASIC METABOLIC PANEL
BUN: 30 mg/dL — AB (ref 4–21)
Creatinine: 4 mg/dL — AB (ref 0.5–1.1)
Glucose: 89 mg/dL
POTASSIUM: 5.2 mmol/L (ref 3.4–5.3)
SODIUM: 140 mmol/L (ref 137–147)

## 2017-01-25 DIAGNOSIS — K8020 Calculus of gallbladder without cholecystitis without obstruction: Secondary | ICD-10-CM

## 2017-01-25 HISTORY — DX: Calculus of gallbladder without cholecystitis without obstruction: K80.20

## 2017-01-26 ENCOUNTER — Ambulatory Visit (INDEPENDENT_AMBULATORY_CARE_PROVIDER_SITE_OTHER): Payer: Medicare PPO | Admitting: Vascular Surgery

## 2017-01-26 ENCOUNTER — Other Ambulatory Visit: Payer: Self-pay

## 2017-01-26 ENCOUNTER — Encounter: Payer: Self-pay | Admitting: Vascular Surgery

## 2017-01-26 VITALS — BP 141/81 | HR 61 | Temp 97.1°F | Resp 16 | Ht 64.0 in | Wt 160.0 lb

## 2017-01-26 DIAGNOSIS — N186 End stage renal disease: Secondary | ICD-10-CM

## 2017-01-26 DIAGNOSIS — Z992 Dependence on renal dialysis: Secondary | ICD-10-CM

## 2017-01-26 NOTE — Progress Notes (Signed)
Patient ID: Brandy Dudley, female   DOB: November 16, 1951, 66 y.o.   MRN: [id number]  Reason for Consult: Re-evaluation   Referred by [name], MD  Subjective:     HPI:  Brandy Dudley is a 66 y.o. female here for evaluation of new access. She previously had a left upper extremity AV fistula that failed with pacemaker in place. I then placed a right upper she'll be AV graft that was never used and underwent multiple right upper extremity declots by Dr. Juel Burrow. She has missed several follow-up appointments but is now much better health and is here to discuss AV access. She currently dialyzes via right IJ tunneled dialysis catheter. She has never had lower extremity accesses but is concerned because of her Charcot foot on the right. She is opened access on the left side. She does not have fever chills or other constitutional symptoms and is eating well and gaining weight and dialyzing well through her catheter.  Past Medical History:  Diagnosis Date  . AICD (automatic cardioverter/defibrillator) present    Biotronik Inventra 7 VR-T DX 04/12/15  . Anemia   . Anxiety   . C. difficile colitis   . Cardiomyopathy (HCC)    02/02/16 ([facility]): Mixed ischemic and non-ischemic cardiomyopathy  . CHF (congestive heart failure) (HCC)    systolic  . COPD (chronic obstructive pulmonary disease) (HCC)   . Coronary artery disease   . Diabetes mellitus (HCC)   . DVT (deep venous thrombosis) (HCC)    on coumadin  . ESRD (end stage renal disease) (HCC)    tues/thurs/sat dialysis  . Hypertension   . Neuropathy (HCC)   . Obstructive sleep apnea    no cpap  . Renal disorder   . Wears glasses    No family history on file. Past Surgical History:  Procedure Laterality Date  . AV FISTULA PLACEMENT Left 04/03/2016   Procedure: ARTERIOVENOUS (AV) FISTULA CREATION;  Surgeon: Larina Earthly, MD;  Location: [facility] OR;  Service: Vascular;  Laterality: Left;  . AV FISTULA PLACEMENT Right 08/21/2016   Procedure: INSERTION  RIGHT ARM ARTERIOVENOUS GRAFT USING 4-7MM X45  CM ACUSEAL GRAFT;  Surgeon: Maeola Harman, MD;  Location: [facility] OR;  Service: Vascular;  Laterality: Right;  . CORONARY ANGIOPLASTY     LCX stent 2010; by [facility] 01/2016 note: 03/2014 LHC/RHC: minor luminal irregularities LAD and RCA with patent stent first marginal branch of CX. RA 11, PAP 58/22 with mean 39, mean wedge pressure 19, LVEDP 27, PVR 3.9, CO 5.1, Cardiac index 2.5.   . FISTULA SUPERFICIALIZATION Left 06/16/2016   Procedure: SUPERFICIALIZATION OF LEFT ARM BRACHIOCEPHALIC ARTERIOVENOUS FISTULA;  Surgeon: Chuck Hint, MD;  Location: [facility] OR;  Service: Vascular;  Laterality: Left;  . INSERTION OF DIALYSIS CATHETER Left 04/03/2016   Procedure: INSERTION OF DIALYSIS CATHETER;  Surgeon: Larina Earthly, MD;  Location: [facility] OR;  Service: Vascular;  Laterality: Left;  . LIGATION OF COMPETING BRANCHES OF ARTERIOVENOUS FISTULA Left 06/16/2016   Procedure: LIGATION OF COMPETING BRANCHES OF left brachiocephalic ARTERIOVENOUS FISTULA;  Surgeon: Chuck Hint, MD;  Location: [facility] OR;  Service: Vascular;  Laterality: Left;  . PERIPHERAL VASCULAR CATHETERIZATION N/A 07/20/2016   Procedure: A/V Shuntogram/Fistulagram;  Surgeon: Fransisco Hertz, MD;  Location: [facility] INVASIVE CV LAB;  Service: Cardiovascular;  Laterality: N/A;  . PERIPHERAL VASCULAR CATHETERIZATION N/A 07/20/2016   Procedure: Dialysis/Perma Catheter Insertion;  Surgeon: Fransisco Hertz, MD;  Location: [facility] INVASIVE CV LAB;  Service: Cardiovascular;  Laterality: N/A;  . REVISION OF ARTERIOVENOUS GORETEX GRAFT Left 06/16/2016   Procedure: REVISION OF LEFT BRACHIOCEPHALIC ARTERIOVENOUS FISTULA WITH RESECTION OF REDUNDANT SECTION;  Surgeon: Chuck Hint, MD;  Location: [facility] OR;  Service: Vascular;  Laterality: Left;    Short Social History:  Social History  Substance Use Topics  . Smoking status: Former Smoker    Types: Cigarettes    Quit date: 03/03/2006  . Smokeless tobacco:  Never Used  . Alcohol use No    Allergies  Allergen Reactions  . Entresto [Sacubitril-Valsartan] Other (See Comments)     unknown reaction  . Latex Itching  . Sulfa Antibiotics Itching    Current Outpatient Prescriptions  Medication Sig Dispense Refill  . acetaminophen (TYLENOL) 325 MG tablet Take 650 mg by mouth every 6 (six) hours as needed for mild pain or fever (For fever >99.5).     Marland Kitchen albuterol (PROAIR HFA) 108 (90 Base) MCG/ACT inhaler Inhale 2 puffs into the lungs every 6 (six) hours as needed for wheezing or shortness of breath.     Marland Kitchen amiodarone (PACERONE) 200 MG tablet Take 1 tablet (200 mg total) by mouth daily. 30 tablet 6  . atorvastatin (LIPITOR) 10 MG tablet Take 10 mg by mouth at bedtime.     . famotidine (PEPCID) 20 MG tablet Take 1 tablet (20 mg total) by mouth at bedtime.    . fluticasone (FLOVENT HFA) 110 MCG/ACT inhaler Inhale 2 puffs into the lungs 2 (two) times daily.     . insulin glargine (LANTUS) 100 UNIT/ML injection Inject 8 Units into the skin every morning.     . insulin lispro (HUMALOG) 100 UNIT/ML injection Inject 0-10 Units into the skin 3 (three) times daily as needed for high blood sugar (CBG >180). CBG 0-59 Hypoglycemic Protocol, 60-149 0 units 150-250 5 units, 251-300 8 units, 301-350 10 units, >350 call MD    . Inulin (FIBER CHOICE PO) Take 2 tablets by mouth daily as needed (Constipation).     . lactose free nutrition (BOOST PLUS) LIQD Take 237 mLs by mouth daily. At 9PM    . loperamide (IMODIUM) 2 MG capsule Take 1 capsule (2 mg total) by mouth 4 (four) times daily as needed for diarrhea or loose stools. 12 capsule 0  . metoprolol succinate (TOPROL-XL) 25 MG 24 hr tablet Take 12.5 mg by mouth 2 (two) times daily. HOLD for SBP <110 and HP <60    . mirtazapine (REMERON) 15 MG tablet Take 15 mg by mouth at bedtime.    . Multiple Vitamins-Minerals (CERTAVITE/ANTIOXIDANTS) TABS Take 1 tablet by mouth at bedtime.    . nitroGLYCERIN (NITROSTAT) 0.4 MG SL  tablet Place 0.4 mg under the tongue every 5 (five) minutes as needed for chest pain.    Marland Kitchen oxyCODONE-acetaminophen (PERCOCET/ROXICET) 5-325 MG tablet Take one tablet by mouth every 6 hours as needed for mild pain; Take two tablets by mouth every 6 hours as needed for severe pain. Do not exceed 4gm of Tylenol in 24 hours 240 tablet 0  . OXYGEN Inhale 2 L into the lungs as needed.    Marland Kitchen Propylene Glycol (SYSTANE BALANCE OP) Place 1 drop into both eyes 2 (two) times daily.    . sertraline (ZOLOFT) 25 MG tablet Take 25 mg by mouth daily.     . hydrOXYzine (ATARAX/VISTARIL) 50 MG tablet Take 50 mg by mouth every 6 (six) hours as needed for itching.     No current facility-administered medications for this visit.  Review of Systems  Constitutional:  Constitutional negative. HENT: HENT negative.  Eyes: Eyes negative.  Respiratory: Respiratory negative.  Cardiovascular: Cardiovascular negative.  GI: Gastrointestinal negative.  Musculoskeletal: Musculoskeletal negative.  Skin: Skin negative.  Neurological: Neurological negative. Hematologic: Hematologic/lymphatic negative.  Psychiatric: Psychiatric negative.        Objective:  Objective   Vitals:   01/26/17 1407  BP: (!) 141/81  Pulse: 61  Resp: 16  Temp: 97.1 F (36.2 C)  SpO2: 96%  Weight: 160 lb (72.6 kg)  Height: 5\' 4"  (1.626 m)   Body mass index is 27.46 kg/m.  Physical Exam  Constitutional: She is oriented to person, place, and time. She appears well-developed.  HENT:  Head: Normocephalic.  Eyes: Pupils are equal, round, and reactive to light.  Neck: Normal range of motion.  Cardiovascular: Normal rate.   Pulses:      Femoral pulses are 2+ on the right side, and 2+ on the left side.      Popliteal pulses are 2+ on the right side, and 2+ on the left side.  Pulmonary/Chest: Effort normal.  Abdominal: Soft.  Musculoskeletal: Normal range of motion. She exhibits no edema.  Neurological: She is alert and oriented to  person, place, and time.  Skin: Skin is warm and dry.  Psychiatric: She has a normal mood and affect. Her behavior is normal. Judgment and thought content normal.         Assessment/Plan:     Since his initial female here with occluded right upper extremity AV graft now with dialysis via right IJ tunneled dialysis catheter. We will plan for left thigh AV graft she has a popliteal pulse on the left side. We discussed the risks of blood vessel or nerve injury , steal, primary nonfunctioning and need for further procedures to maintain the graft. She demonstrated good understanding we will proceed in the next possible date.     Maeola Harman MD [facility]

## 2017-02-01 ENCOUNTER — Encounter (HOSPITAL_COMMUNITY): Payer: Self-pay

## 2017-02-01 ENCOUNTER — Emergency Department (HOSPITAL_COMMUNITY): Payer: Medicare PPO

## 2017-02-01 ENCOUNTER — Inpatient Hospital Stay (HOSPITAL_COMMUNITY)
Admission: EM | Admit: 2017-02-01 | Discharge: 2017-02-07 | DRG: 444 | Disposition: A | Discharge status: Skilled Nursing Facility | Payer: Medicare PPO | Attending: Internal Medicine | Admitting: Internal Medicine

## 2017-02-01 DIAGNOSIS — E1151 Type 2 diabetes mellitus with diabetic peripheral angiopathy without gangrene: Secondary | ICD-10-CM | POA: Diagnosis present

## 2017-02-01 DIAGNOSIS — I2510 Atherosclerotic heart disease of native coronary artery without angina pectoris: Secondary | ICD-10-CM | POA: Diagnosis present

## 2017-02-01 DIAGNOSIS — K810 Acute cholecystitis: Secondary | ICD-10-CM | POA: Diagnosis not present

## 2017-02-01 DIAGNOSIS — Z87891 Personal history of nicotine dependence: Secondary | ICD-10-CM

## 2017-02-01 DIAGNOSIS — I480 Paroxysmal atrial fibrillation: Secondary | ICD-10-CM | POA: Diagnosis present

## 2017-02-01 DIAGNOSIS — N185 Chronic kidney disease, stage 5: Secondary | ICD-10-CM

## 2017-02-01 DIAGNOSIS — Z91040 Latex allergy status: Secondary | ICD-10-CM

## 2017-02-01 DIAGNOSIS — Z955 Presence of coronary angioplasty implant and graft: Secondary | ICD-10-CM

## 2017-02-01 DIAGNOSIS — N2581 Secondary hyperparathyroidism of renal origin: Secondary | ICD-10-CM | POA: Diagnosis present

## 2017-02-01 DIAGNOSIS — Z7951 Long term (current) use of inhaled steroids: Secondary | ICD-10-CM

## 2017-02-01 DIAGNOSIS — K8000 Calculus of gallbladder with acute cholecystitis without obstruction: Secondary | ICD-10-CM

## 2017-02-01 DIAGNOSIS — I132 Hypertensive heart and chronic kidney disease with heart failure and with stage 5 chronic kidney disease, or end stage renal disease: Secondary | ICD-10-CM | POA: Diagnosis present

## 2017-02-01 DIAGNOSIS — I5042 Chronic combined systolic (congestive) and diastolic (congestive) heart failure: Secondary | ICD-10-CM | POA: Diagnosis present

## 2017-02-01 DIAGNOSIS — I252 Old myocardial infarction: Secondary | ICD-10-CM

## 2017-02-01 DIAGNOSIS — I255 Ischemic cardiomyopathy: Secondary | ICD-10-CM | POA: Diagnosis present

## 2017-02-01 DIAGNOSIS — Z794 Long term (current) use of insulin: Secondary | ICD-10-CM

## 2017-02-01 DIAGNOSIS — Z01818 Encounter for other preprocedural examination: Secondary | ICD-10-CM

## 2017-02-01 DIAGNOSIS — N186 End stage renal disease: Secondary | ICD-10-CM | POA: Diagnosis present

## 2017-02-01 DIAGNOSIS — J449 Chronic obstructive pulmonary disease, unspecified: Secondary | ICD-10-CM | POA: Diagnosis present

## 2017-02-01 DIAGNOSIS — G4733 Obstructive sleep apnea (adult) (pediatric): Secondary | ICD-10-CM | POA: Diagnosis present

## 2017-02-01 DIAGNOSIS — Z888 Allergy status to other drugs, medicaments and biological substances status: Secondary | ICD-10-CM

## 2017-02-01 DIAGNOSIS — R109 Unspecified abdominal pain: Secondary | ICD-10-CM | POA: Diagnosis not present

## 2017-02-01 DIAGNOSIS — Z882 Allergy status to sulfonamides status: Secondary | ICD-10-CM

## 2017-02-01 DIAGNOSIS — K429 Umbilical hernia without obstruction or gangrene: Secondary | ICD-10-CM | POA: Diagnosis present

## 2017-02-01 DIAGNOSIS — E785 Hyperlipidemia, unspecified: Secondary | ICD-10-CM | POA: Diagnosis present

## 2017-02-01 DIAGNOSIS — Z992 Dependence on renal dialysis: Secondary | ICD-10-CM

## 2017-02-01 DIAGNOSIS — D631 Anemia in chronic kidney disease: Secondary | ICD-10-CM | POA: Diagnosis present

## 2017-02-01 DIAGNOSIS — Z95810 Presence of automatic (implantable) cardiac defibrillator: Secondary | ICD-10-CM

## 2017-02-01 DIAGNOSIS — K5641 Fecal impaction: Secondary | ICD-10-CM | POA: Diagnosis present

## 2017-02-01 DIAGNOSIS — E1122 Type 2 diabetes mellitus with diabetic chronic kidney disease: Secondary | ICD-10-CM | POA: Diagnosis present

## 2017-02-01 HISTORY — DX: Calculus of gallbladder without cholecystitis without obstruction: K80.20

## 2017-02-01 LAB — COMPREHENSIVE METABOLIC PANEL
ALK PHOS: 199 U/L — AB (ref 38–126)
ALT: 9 U/L — ABNORMAL LOW (ref 14–54)
AST: 15 U/L (ref 15–41)
Albumin: 1.8 g/dL — ABNORMAL LOW (ref 3.5–5.0)
Anion gap: 9 (ref 5–15)
BILIRUBIN TOTAL: 0.6 mg/dL (ref 0.3–1.2)
BUN: 18 mg/dL (ref 6–20)
CALCIUM: 7.8 mg/dL — AB (ref 8.9–10.3)
CO2: 28 mmol/L (ref 22–32)
Chloride: 98 mmol/L — ABNORMAL LOW (ref 101–111)
Creatinine, Ser: 2.91 mg/dL — ABNORMAL HIGH (ref 0.44–1.00)
GFR calc Af Amer: 18 mL/min — ABNORMAL LOW (ref >60)
GFR, EST NON AFRICAN AMERICAN: 16 mL/min — AB (ref >60)
GLUCOSE: 80 mg/dL (ref 65–99)
Potassium: 3 mmol/L — ABNORMAL LOW (ref 3.5–5.1)
Sodium: 135 mmol/L (ref 135–145)
TOTAL PROTEIN: 8.3 g/dL — AB (ref 6.5–8.1)

## 2017-02-01 LAB — CBC WITH DIFFERENTIAL/PLATELET
BASOS ABS: 0 10*3/uL (ref 0.0–0.1)
BASOS PCT: 0 %
EOS ABS: 0.2 10*3/uL (ref 0.0–0.7)
Eosinophils Relative: 2 %
HCT: 30.3 % — ABNORMAL LOW (ref 36.0–46.0)
HEMOGLOBIN: 9.2 g/dL — AB (ref 12.0–15.0)
Lymphocytes Relative: 11 %
Lymphs Abs: 1.2 10*3/uL (ref 0.7–4.0)
MCH: 24.8 pg — ABNORMAL LOW (ref 26.0–34.0)
MCHC: 30.4 g/dL (ref 30.0–36.0)
MCV: 81.7 fL (ref 78.0–100.0)
Monocytes Absolute: 0.7 10*3/uL (ref 0.1–1.0)
Monocytes Relative: 6 %
NEUTROS PCT: 81 %
Neutro Abs: 8.6 10*3/uL — ABNORMAL HIGH (ref 1.7–7.7)
Platelets: 287 10*3/uL (ref 150–400)
RBC: 3.71 MIL/uL — ABNORMAL LOW (ref 3.87–5.11)
RDW: 18.1 % — ABNORMAL HIGH (ref 11.5–15.5)
WBC: 10.7 10*3/uL — AB (ref 4.0–10.5)

## 2017-02-01 LAB — LIPASE, BLOOD: LIPASE: 19 U/L (ref 11–51)

## 2017-02-01 MED ORDER — MORPHINE SULFATE (PF) 4 MG/ML IV SOLN
4.0000 mg | Freq: Once | INTRAVENOUS | Status: AC
  Administered 2017-02-01: 4 mg via INTRAVENOUS
  Filled 2017-02-01: qty 1

## 2017-02-01 MED ORDER — IOPAMIDOL (ISOVUE-300) INJECTION 61%
80.0000 mL | Freq: Once | INTRAVENOUS | Status: AC | PRN
  Administered 2017-02-01: 100 mL via INTRAVENOUS

## 2017-02-01 MED ORDER — MORPHINE SULFATE (PF) 4 MG/ML IV SOLN
4.0000 mg | Freq: Once | INTRAVENOUS | Status: AC
Start: 2017-02-01 — End: 2017-02-01
  Administered 2017-02-01: 4 mg via INTRAVENOUS
  Filled 2017-02-01: qty 1

## 2017-02-01 NOTE — ED Notes (Signed)
ED Provider at bedside. 

## 2017-02-01 NOTE — ED Provider Notes (Signed)
Receive sign out from Dr. Winfred Leeds.  Pt has 1 month long of R abd pain.  Currently awaits CT scan.  11:36 PM Pt has mildly elevated WBC of 10.7.  Elevated alk phos of 199.  Normal lipase of 19.  Abd/pelvis CT scan showing mild mucosal enhancement with small volume free pericholecystic fluid and subtle hazing stranding about the gallbladder.  Findings may reflect sequelae of acute cholecystitis.    Given increase post prandial pain for the past 1-2 weeks, active RUQ pain currently, plan to consult surgery. Anticipate medicine admission.    12:07 AM Appreciate consultation from on call surgeon Dr. Grandville Silos who request medicine for admission and will be available for consultation.    Pt's Lyndel Safe Perioperative score is 1.8 due to being nursing home pt with multiple comorbilities including ESRD, pacer, etc...  12:36 AM Appreciate consultation from [facility] Dr. Hal Hope who agrees to see and admit pt for further care.   BP 127/64   Pulse 62   Temp 98.3 F (36.8 C) (Oral)   Resp 19   Ht '5\' 4"'  (1.626 m)   Wt 72.6 kg   SpO2 96%   BMI 27.46 kg/m   Results for orders placed or performed during the hospital encounter of 02/01/17  Comprehensive metabolic panel  Result Value Ref Range   Sodium 135 135 - 145 mmol/L   Potassium 3.0 (L) 3.5 - 5.1 mmol/L   Chloride 98 (L) 101 - 111 mmol/L   CO2 28 22 - 32 mmol/L   Glucose, Bld 80 65 - 99 mg/dL   BUN 18 6 - 20 mg/dL   Creatinine, Ser 2.91 (H) 0.44 - 1.00 mg/dL   Calcium 7.8 (L) 8.9 - 10.3 mg/dL   Total Protein 8.3 (H) 6.5 - 8.1 g/dL   Albumin 1.8 (L) 3.5 - 5.0 g/dL   AST 15 15 - 41 U/L   ALT 9 (L) 14 - 54 U/L   Alkaline Phosphatase 199 (H) 38 - 126 U/L   Total Bilirubin 0.6 0.3 - 1.2 mg/dL   GFR calc non Af Amer 16 (L) >60 mL/min   GFR calc Af Amer 18 (L) >60 mL/min   Anion gap 9 5 - 15  CBC with Differential/Platelet  Result Value Ref Range   WBC 10.7 (H) 4.0 - 10.5 K/uL   RBC 3.71 (L) 3.87 - 5.11 MIL/uL   Hemoglobin 9.2  (L) 12.0 - 15.0 g/dL   HCT 30.3 (L) 36.0 - 46.0 %   MCV 81.7 78.0 - 100.0 fL   MCH 24.8 (L) 26.0 - 34.0 pg   MCHC 30.4 30.0 - 36.0 g/dL   RDW 18.1 (H) 11.5 - 15.5 %   Platelets 287 150 - 400 K/uL   Neutrophils Relative % 81 %   Neutro Abs 8.6 (H) 1.7 - 7.7 K/uL   Lymphocytes Relative 11 %   Lymphs Abs 1.2 0.7 - 4.0 K/uL   Monocytes Relative 6 %   Monocytes Absolute 0.7 0.1 - 1.0 K/uL   Eosinophils Relative 2 %   Eosinophils Absolute 0.2 0.0 - 0.7 K/uL   Basophils Relative 0 %   Basophils Absolute 0.0 0.0 - 0.1 K/uL  Lipase, blood  Result Value Ref Range   Lipase 19 11 - 51 U/L   Ct Abdomen Pelvis W Contrast  Result Date: 02/01/2017 CLINICAL DATA:  Initial evaluation for right-sided abdominal pain for 1 month. EXAM: CT ABDOMEN AND PELVIS WITH CONTRAST TECHNIQUE: Multidetector CT imaging of the abdomen and pelvis was  performed using the standard protocol following bolus administration of intravenous contrast. CONTRAST:  15m ISOVUE-300 IOPAMIDOL (ISOVUE-300) INJECTION 61% COMPARISON:  Prior CT from 08/26/2016. FINDINGS: Lower chest: Small layering bilateral pleural effusions with associated bibasilar atelectasis. Cardiomegaly with pacemaker electrodes partially visualized. Hepatobiliary: Few scattered subcentimeter hypodensities noted within the liver, too small the characterize, but may reflect small cyst. Liver otherwise unremarkable. Mucosal edema with mild fuzzy stranding seen about the gallbladder, which may reflect sequelae of acute cholecystitis. Question trace free pericholecystic fluid. No definite internal stones identified. No appreciable biliary dilatation. Pancreas: Pancreas within normal limits. Spleen: Spleen within normal limits. Adrenals/Urinary Tract: Adrenal glands are normal. Kidneys equal in size with symmetric enhancement. Few scattered subcentimeter hypodensities noted within the right kidney, too small the characterize, but statistically likely reflects small cyst.  Kidneys otherwise unremarkable without nephrolithiasis, hydronephrosis, or focal enhancing renal mass. No hydroureter. Bladder largely decompressed. Circumferential bladder wall thickening likely related incomplete distension. Stomach/Bowel: Stomach within normal limits. No evidence for bowel obstruction. Appendix well visualized within the right lower quadrant and is within normal limits without associated inflammatory changes to suggest acute appendicitis. No acute inflammatory changes seen about the bowels. Large amount of impacted stool within the rectal vault, suggesting constipation, similar to previous. Vascular/Lymphatic: Moderate aorto bi-iliac atherosclerotic disease. No aneurysm. No pathologically enlarged intra-abdominal or pelvic lymph nodes. Reproductive: Small amount of fluid present within the endometrial canal. Uterus otherwise unremarkable. Ovaries within normal limits. Other: No free air or fluid. Fat containing paraumbilical hernia noted. With without closely approximated at the base of the hernia without associated inflammation or obstruction. Musculoskeletal: Mild diffuse anasarca noted, likely related to overall volume status. Progressive height loss with endplate irregularity within the visualized vertebral bodies of the thoracic spine, suspected to be related to dialysis/renal osteodystrophy given patient history. No definite acute osseous abnormality. Bilateral facet arthrosis noted within the lower lumbar spine. No other worrisome lytic or blastic osseous lesions. IMPRESSION: 1. Mild mucosal enhancement with small volume free pericholecystic fluid and subtle hazy stranding about the gallbladder. Findings may reflect sequelae of acute cholecystitis. Correlation were laboratory values recommended. Additionally, this could be further assessed with dedicated right upper quadrant ultrasound as indicated. 2. Large amount of impacted stool within the rectal vault, similar to previous. 3.  Cardiomegaly with bilateral pleural effusions and diffuse anasarca. Associated bibasilar atelectasis within the lung bases. 4. 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 Electronically Signed   By: BJeannine BogaM.D.   On: 02/01/2017 23:28        BDomenic Moras PA-C 02/02/17 0037    CGwenyth AllegraTegeler, MD 02/02/17 1050

## 2017-02-01 NOTE — ED Provider Notes (Signed)
[facility]-EMERGENCY DEPT Provider Note   CSN: [id number] Arrival date & time: 02/01/17  1912     History   Chief Complaint Chief Complaint  Patient presents with  . Flank Pain    HPI Brandy Dudley is a 66 y.o. female.Complains of right-sided abdominal pain for one month pain is constant made worse with eating certain foods but is improved with eating ice cream or with eating ice. Her last bowel movement was yesterday, a small amount. Patient is an uric. She denies fever. She reports she was nauseated earlier but not presently. She was treated with oxycodone earlier today, without adequate relief. Pain is presently an 8 on a scale of 1-10.  HPI  Past Medical History:  Diagnosis Date  . AICD (automatic cardioverter/defibrillator) present    Biotronik Inventra 7 VR-T DX 04/12/15  . Anemia   . Anxiety   . C. difficile colitis   . Cardiomyopathy (HCC)    02/02/16 ([facility]): Mixed ischemic and non-ischemic cardiomyopathy  . CHF (congestive heart failure) (HCC)    systolic  . COPD (chronic obstructive pulmonary disease) (HCC)   . Coronary artery disease   . Diabetes mellitus (HCC)   . DVT (deep venous thrombosis) (HCC)    on coumadin  . ESRD (end stage renal disease) (HCC)    tues/thurs/sat dialysis  . Hypertension   . Neuropathy (HCC)   . Obstructive sleep apnea    no cpap  . Renal disorder   . Wears glasses     Patient Active Problem List   Diagnosis Date Noted  . Protein-calorie malnutrition, severe 07/18/2016  . Positive blood culture 07/17/2016  . Atherosclerotic peripheral vascular disease (HCC) 07/17/2016  . Bacteremia 07/17/2016  . Depression 07/05/2016  . Umbilical hernia without obstruction or gangrene 07/05/2016  . Generalized anxiety disorder 07/05/2016  . Loss of weight 07/05/2016  . Chronic combined systolic and diastolic CHF (congestive heart failure) (HCC) 05/07/2016  . 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 Anemia of  chronic renal failure, stage 5 (HCC) 04/14/2016  . ESRD on dialysis (HCC)   . History of Clostridium difficile colitis 03/20/2016  . COPD (chronic obstructive pulmonary disease) (HCC) 03/20/2016  . Obstructive sleep apnea 03/20/2016  . AICD (automatic cardioverter/defibrillator) present 03/20/2016  . Physical deconditioning 03/02/2016  . DVT (deep venous thrombosis), right 03/02/2016  . Cardiomyopathy, ischemic 03/02/2016  . Coronary artery disease involving native coronary artery of native heart without angina pectoris 03/02/2016  . Diabetes mellitus type 2 in obese (HCC) 03/02/2016    Past Surgical History:  Procedure Laterality Date  . AV FISTULA PLACEMENT Left 04/03/2016   Procedure: ARTERIOVENOUS (AV) FISTULA CREATION;  Surgeon: Larina Earthly, MD;  Location: [facility] OR;  Service: Vascular;  Laterality: Left;  . AV FISTULA PLACEMENT Right 08/21/2016   Procedure: INSERTION RIGHT ARM ARTERIOVENOUS GRAFT USING 4-7MM X45  CM ACUSEAL GRAFT;  Surgeon: Maeola Harman, MD;  Location: [facility] OR;  Service: Vascular;  Laterality: Right;  . CORONARY ANGIOPLASTY     LCX stent 2010; by [facility] 01/2016 note: 03/2014 LHC/RHC: minor luminal irregularities LAD and RCA with patent stent first marginal branch of CX. RA 11, PAP 58/22 with mean 39, mean wedge pressure 19, LVEDP 27, PVR 3.9, CO 5.1, Cardiac index 2.5.   . FISTULA SUPERFICIALIZATION Left 06/16/2016   Procedure: SUPERFICIALIZATION OF LEFT ARM BRACHIOCEPHALIC ARTERIOVENOUS FISTULA;  Surgeon: Chuck Hint, MD;  Location: [facility] OR;  Service: Vascular;  Laterality: Left;  .  INSERTION OF DIALYSIS CATHETER Left 04/03/2016   Procedure: INSERTION OF DIALYSIS CATHETER;  Surgeon: Larina Earthly, MD;  Location: [facility] OR;  Service: Vascular;  Laterality: Left;  . LIGATION OF COMPETING BRANCHES OF ARTERIOVENOUS FISTULA Left 06/16/2016   Procedure: LIGATION OF COMPETING BRANCHES OF left brachiocephalic ARTERIOVENOUS FISTULA;  Surgeon: Chuck Hint,  MD;  Location: [facility] OR;  Service: Vascular;  Laterality: Left;  . PERIPHERAL VASCULAR CATHETERIZATION N/A 07/20/2016   Procedure: A/V Shuntogram/Fistulagram;  Surgeon: Fransisco Hertz, MD;  Location: [facility] INVASIVE CV LAB;  Service: Cardiovascular;  Laterality: N/A;  . PERIPHERAL VASCULAR CATHETERIZATION N/A 07/20/2016   Procedure: Dialysis/Perma Catheter Insertion;  Surgeon: Fransisco Hertz, MD;  Location: [facility] INVASIVE CV LAB;  Service: Cardiovascular;  Laterality: N/A;  . REVISION OF ARTERIOVENOUS GORETEX GRAFT Left 06/16/2016   Procedure: REVISION OF LEFT BRACHIOCEPHALIC ARTERIOVENOUS FISTULA WITH RESECTION OF REDUNDANT SECTION;  Surgeon: Chuck Hint, MD;  Location: [facility] OR;  Service: Vascular;  Laterality: Left;    OB History    No data available       Home Medications    Prior to Admission medications   Medication Sig Start Date End Date Taking? Authorizing Provider  acetaminophen (TYLENOL) 325 MG tablet Take 650 mg by mouth every 6 (six) hours as needed for mild pain or fever (For fever >99.5).     Historical Provider, MD  albuterol (PROAIR HFA) 108 (90 Base) MCG/ACT inhaler Inhale 2 puffs into the lungs every 6 (six) hours as needed for wheezing or shortness of breath.     Historical Provider, MD  amiodarone (PACERONE) 200 MG tablet Take 1 tablet (200 mg total) by mouth daily. 10/11/16   Marinus Maw, MD  atorvastatin (LIPITOR) 10 MG tablet Take 10 mg by mouth at bedtime.     Historical Provider, MD  famotidine (PEPCID) 20 MG tablet Take 1 tablet (20 mg total) by mouth at bedtime. 07/22/16   Maryann Mikhail, DO  fluticasone (FLOVENT HFA) 110 MCG/ACT inhaler Inhale 2 puffs into the lungs 2 (two) times daily.     Historical Provider, MD  hydrOXYzine (ATARAX/VISTARIL) 50 MG tablet Take 50 mg by mouth every 6 (six) hours as needed for itching.    Historical Provider, MD  insulin glargine (LANTUS) 100 UNIT/ML injection Inject 8 Units into the skin every morning.     Historical Provider, MD    insulin lispro (HUMALOG) 100 UNIT/ML injection Inject 0-10 Units into the skin 3 (three) times daily as needed for high blood sugar (CBG >180). CBG 0-59 Hypoglycemic Protocol, 60-149 0 units 150-250 5 units, 251-300 8 units, 301-350 10 units, >350 call MD    Historical Provider, MD  Inulin (FIBER CHOICE PO) Take 2 tablets by mouth daily as needed (Constipation).     Historical Provider, MD  lactose free nutrition (BOOST PLUS) LIQD Take 237 mLs by mouth daily. At 9PM    Historical Provider, MD  loperamide (IMODIUM) 2 MG capsule Take 1 capsule (2 mg total) by mouth 4 (four) times daily as needed for diarrhea or loose stools. 09/16/16   Jacalyn Lefevre, MD  metoprolol succinate (TOPROL-XL) 25 MG 24 hr tablet Take 12.5 mg by mouth 2 (two) times daily. HOLD for SBP <110 and HP <60    Historical Provider, MD  mirtazapine (REMERON) 15 MG tablet Take 15 mg by mouth at bedtime.    Historical Provider, MD  Multiple Vitamins-Minerals (CERTAVITE/ANTIOXIDANTS) TABS Take 1 tablet by mouth at bedtime.    Historical Provider, MD  nitroGLYCERIN (NITROSTAT) 0.4 MG SL tablet Place 0.4 mg under the tongue every 5 (five) minutes as needed for chest pain.    Historical Provider, MD  oxyCODONE-acetaminophen (PERCOCET/ROXICET) 5-325 MG tablet Take one tablet by mouth every 6 hours as needed for mild pain; Take two tablets by mouth every 6 hours as needed for severe pain. Do not exceed 4gm of Tylenol in 24 hours 10/02/16   Tiffany L Reed, DO  OXYGEN Inhale 2 L into the lungs as needed.    Historical Provider, MD  Propylene Glycol (SYSTANE BALANCE OP) Place 1 drop into both eyes 2 (two) times daily.    Historical Provider, MD  sertraline (ZOLOFT) 25 MG tablet Take 25 mg by mouth daily.     Historical Provider, MD    Family History No family history on file.  Social History Social History  Substance Use Topics  . Smoking status: Former Smoker    Types: Cigarettes    Quit date: 03/03/2006  . Smokeless tobacco: Never Used   . Alcohol use No     Allergies   Entresto [sacubitril-valsartan]; Latex; and Sulfa antibiotics   Review of Systems Review of Systems  Gastrointestinal: Positive for abdominal pain and nausea. Negative for vomiting.       No nausea at present  Genitourinary:       Anuric . Hemodialysis patient  Musculoskeletal:       Has dialysis fistulas in bilateral arms, neither of which are functional. Receives hemodialysis through a catheter at right subclavian area  Allergic/Immunologic: Positive for immunocompromised state.       Hemodialysis patient  All other systems reviewed and are negative.    Physical Exam Updated Vital Signs BP 132/72 (BP Location: Right Leg)   Pulse 61   Temp 98.3 F (36.8 C) (Oral)   Resp 16   Ht 5\' 4"  (1.626 m)   Wt 160 lb (72.6 kg)   SpO2 96%   BMI 27.46 kg/m   Physical Exam  Constitutional:  Chronically ill-appearing  HENT:  Head: Normocephalic and atraumatic.  Eyes: Conjunctivae are normal. Pupils are equal, round, and reactive to light.  Neck: Neck supple. No tracheal deviation present. No thyromegaly present.  Cardiovascular: Normal rate and regular rhythm.   No murmur heard. Dialysis catheter at right subclavian area  Pulmonary/Chest: Effort normal and breath sounds normal.  Abdominal: Soft. Bowel sounds are normal. She exhibits no distension and no mass. There is tenderness. There is no rebound and no guarding.  Tender at right upper quadrant and right lower quadrant  Musculoskeletal: Normal range of motion. She exhibits no edema or tenderness.  Dialysis fistulas at both arms, neither of which has thrill.  Neurological: She is alert. Coordination normal.  Skin: Skin is warm and dry. No rash noted.  Psychiatric: She has a normal mood and affect.  Nursing note and vitals reviewed.    ED Treatments / Results  Labs (all labs ordered are listed, but only abnormal results are displayed) Labs Reviewed  COMPREHENSIVE METABOLIC PANEL  CBC  WITH DIFFERENTIAL/PLATELET  LIPASE, BLOOD    EKG  EKG Interpretation None       Radiology No results found.  Procedures Procedures (including critical care time)  Medications Ordered in ED Medications  morphine 4 MG/ML injection 4 mg (not administered)   Results for orders placed or performed in visit on 01/17/17  Hemoglobin A1c  Result Value Ref Range   Hemoglobin A1C 5   Lipid panel  Result Value Ref Range  Triglycerides 87 40 - 160 mg/dL   Cholesterol 191 0 - 478 mg/dL   HDL 40 35 - 70 mg/dL   LDL Cholesterol 90 mg/dL  CBC and differential  Result Value Ref Range   HCT 35 (A) 36 - 46 %   WBC 8.6 10^3/mL  Basic metabolic panel  Result Value Ref Range   Glucose 102 mg/dL   Potassium 4.1 3.4 - 5.3 mmol/L   Sodium 139 137 - 147 mmol/L  Hepatic function panel  Result Value Ref Range   Alkaline Phosphatase 172 (A) 25 - 125 U/L  CBC and differential  Result Value Ref Range   Hemoglobin 10.3 (A) 12.0 - 16.0 g/dL  HM DIABETES EYE EXAM  Result Value Ref Range   HM Diabetic Eye Exam No Retinopathy No Retinopathy  HM DIABETES FOOT EXAM  Result Value Ref Range   HM Diabetic Foot Exam completed by [employer] [place]    No results found.   Initial Impression / Assessment and Plan / ED Course  I have reviewed the triage vital signs and the nursing notes.  Pertinent labs & imaging results that were available during my care of the patient were reviewed by me and considered in my medical decision making (see chart for details).     Patient signed out to Dr.Tegeler in conjunction with Mr.Tran, PA at 8:10 PM  Final Clinical Impressions(s) / ED Diagnoses  Diagnosis right-sided abdominal pain Final diagnoses:  None    New Prescriptions New Prescriptions   No medications on file     Doug Sou, MD 02/01/17 2014

## 2017-02-01 NOTE — ED Triage Notes (Addendum)
Pt via EMS with complaint of R side/flank pain x 1 month. Pt diagnosed with gallbladder polyps last week. Per EMS, pt completed full session of dialysis today. Pt has one old fistula in each arm, an active dialysis in R chest, and pacemaker in the L chest. Pt denies N/V/D or fever. Pt unable to make urine. A&O x4. CBG 79, 96.2 degrees, HR 62, RR 18, 135/70, 95% on RA. Pt reports she took an oxycodone at 1730 which did not help her pain.

## 2017-02-01 NOTE — ED Notes (Signed)
Multiple attempts done for IV access with no success, 2 RN tried. EDP to be notified.

## 2017-02-01 NOTE — ED Notes (Signed)
Patient transported to CT 

## 2017-02-02 ENCOUNTER — Encounter (HOSPITAL_COMMUNITY): Payer: Self-pay | Admitting: Internal Medicine

## 2017-02-02 ENCOUNTER — Inpatient Hospital Stay (HOSPITAL_COMMUNITY): Payer: Medicare PPO

## 2017-02-02 DIAGNOSIS — Z95810 Presence of automatic (implantable) cardiac defibrillator: Secondary | ICD-10-CM | POA: Diagnosis not present

## 2017-02-02 DIAGNOSIS — I5042 Chronic combined systolic (congestive) and diastolic (congestive) heart failure: Secondary | ICD-10-CM

## 2017-02-02 DIAGNOSIS — K810 Acute cholecystitis: Secondary | ICD-10-CM | POA: Diagnosis present

## 2017-02-02 DIAGNOSIS — Z992 Dependence on renal dialysis: Secondary | ICD-10-CM | POA: Diagnosis not present

## 2017-02-02 DIAGNOSIS — Z01818 Encounter for other preprocedural examination: Secondary | ICD-10-CM | POA: Diagnosis not present

## 2017-02-02 DIAGNOSIS — J449 Chronic obstructive pulmonary disease, unspecified: Secondary | ICD-10-CM | POA: Diagnosis present

## 2017-02-02 DIAGNOSIS — Z91040 Latex allergy status: Secondary | ICD-10-CM | POA: Diagnosis not present

## 2017-02-02 DIAGNOSIS — Z882 Allergy status to sulfonamides status: Secondary | ICD-10-CM | POA: Diagnosis not present

## 2017-02-02 DIAGNOSIS — I252 Old myocardial infarction: Secondary | ICD-10-CM | POA: Diagnosis not present

## 2017-02-02 DIAGNOSIS — I2510 Atherosclerotic heart disease of native coronary artery without angina pectoris: Secondary | ICD-10-CM | POA: Diagnosis present

## 2017-02-02 DIAGNOSIS — E1122 Type 2 diabetes mellitus with diabetic chronic kidney disease: Secondary | ICD-10-CM | POA: Diagnosis not present

## 2017-02-02 DIAGNOSIS — I255 Ischemic cardiomyopathy: Secondary | ICD-10-CM

## 2017-02-02 DIAGNOSIS — N186 End stage renal disease: Secondary | ICD-10-CM

## 2017-02-02 DIAGNOSIS — E1151 Type 2 diabetes mellitus with diabetic peripheral angiopathy without gangrene: Secondary | ICD-10-CM | POA: Diagnosis present

## 2017-02-02 DIAGNOSIS — Z87891 Personal history of nicotine dependence: Secondary | ICD-10-CM | POA: Diagnosis not present

## 2017-02-02 DIAGNOSIS — Z888 Allergy status to other drugs, medicaments and biological substances status: Secondary | ICD-10-CM | POA: Diagnosis not present

## 2017-02-02 DIAGNOSIS — Z7951 Long term (current) use of inhaled steroids: Secondary | ICD-10-CM | POA: Diagnosis not present

## 2017-02-02 DIAGNOSIS — D631 Anemia in chronic kidney disease: Secondary | ICD-10-CM | POA: Diagnosis present

## 2017-02-02 DIAGNOSIS — I132 Hypertensive heart and chronic kidney disease with heart failure and with stage 5 chronic kidney disease, or end stage renal disease: Secondary | ICD-10-CM | POA: Diagnosis present

## 2017-02-02 DIAGNOSIS — I480 Paroxysmal atrial fibrillation: Secondary | ICD-10-CM | POA: Diagnosis present

## 2017-02-02 DIAGNOSIS — Z794 Long term (current) use of insulin: Secondary | ICD-10-CM | POA: Diagnosis not present

## 2017-02-02 DIAGNOSIS — N2581 Secondary hyperparathyroidism of renal origin: Secondary | ICD-10-CM | POA: Diagnosis present

## 2017-02-02 DIAGNOSIS — T8859XA Other complications of anesthesia, initial encounter: Secondary | ICD-10-CM

## 2017-02-02 DIAGNOSIS — Z955 Presence of coronary angioplasty implant and graft: Secondary | ICD-10-CM | POA: Diagnosis not present

## 2017-02-02 DIAGNOSIS — K429 Umbilical hernia without obstruction or gangrene: Secondary | ICD-10-CM | POA: Diagnosis present

## 2017-02-02 DIAGNOSIS — K5641 Fecal impaction: Secondary | ICD-10-CM | POA: Diagnosis present

## 2017-02-02 DIAGNOSIS — E785 Hyperlipidemia, unspecified: Secondary | ICD-10-CM | POA: Diagnosis present

## 2017-02-02 DIAGNOSIS — R109 Unspecified abdominal pain: Secondary | ICD-10-CM | POA: Diagnosis present

## 2017-02-02 HISTORY — PX: IR GENERIC HISTORICAL: IMG1180011

## 2017-02-02 HISTORY — DX: Other complications of anesthesia, initial encounter: T88.59XA

## 2017-02-02 HISTORY — PX: OTHER SURGICAL HISTORY: SHX169

## 2017-02-02 LAB — GLUCOSE, CAPILLARY
GLUCOSE-CAPILLARY: 146 mg/dL — AB (ref 65–99)
GLUCOSE-CAPILLARY: 47 mg/dL — AB (ref 65–99)
Glucose-Capillary: 187 mg/dL — ABNORMAL HIGH (ref 65–99)
Glucose-Capillary: 58 mg/dL — ABNORMAL LOW (ref 65–99)
Glucose-Capillary: 57 mg/dL — ABNORMAL LOW (ref 65–99)
Glucose-Capillary: 79 mg/dL (ref 65–99)
Glucose-Capillary: 106 mg/dL — ABNORMAL HIGH (ref 65–99)
Glucose-Capillary: 57 mg/dL — ABNORMAL LOW (ref 65–99)
Glucose-Capillary: 95 mg/dL (ref 65–99)

## 2017-02-02 LAB — PROTIME-INR
INR: 1.43
PROTHROMBIN TIME: 17.6 s — AB (ref 11.4–15.2)

## 2017-02-02 LAB — SURGICAL PCR SCREEN
MRSA, PCR: NEGATIVE
Staphylococcus aureus: NEGATIVE

## 2017-02-02 MED ORDER — SERTRALINE HCL 25 MG PO TABS
25.0000 mg | ORAL_TABLET | Freq: Every day | ORAL | Status: DC
  Administered 2017-02-02 – 2017-02-07 (×5): 25 mg via ORAL
  Filled 2017-02-02 (×5): qty 1

## 2017-02-02 MED ORDER — FENTANYL CITRATE (PF) 100 MCG/2ML IJ SOLN
INTRAMUSCULAR | Status: AC | PRN
  Administered 2017-02-02 (×2): 50 ug via INTRAVENOUS

## 2017-02-02 MED ORDER — DEXTROSE 50 % IV SOLN
INTRAVENOUS | Status: AC
Start: 2017-02-02 — End: 2017-02-02
  Administered 2017-02-02: 50 mL
  Filled 2017-02-02: qty 50

## 2017-02-02 MED ORDER — METOPROLOL TARTRATE 5 MG/5ML IV SOLN: 5.0000 mg | Freq: Four times a day (QID) | INTRAVENOUS | Status: DC | PRN

## 2017-02-02 MED ORDER — SODIUM CHLORIDE 0.9 % IV SOLN: 100.0000 mL | INTRAVENOUS | Status: DC | PRN

## 2017-02-02 MED ORDER — INSULIN ASPART 100 UNIT/ML ~~LOC~~ SOLN
0.0000 [IU] | Freq: Three times a day (TID) | SUBCUTANEOUS | Status: DC
  Administered 2017-02-06 – 2017-02-07 (×2): 1 [IU] via SUBCUTANEOUS

## 2017-02-02 MED ORDER — DEXTROSE 50 % IV SOLN
INTRAVENOUS | Status: AC
  Administered 2017-02-02: 16:00:00
  Filled 2017-02-02: qty 50

## 2017-02-02 MED ORDER — DEXTROSE 50 % IV SOLN
INTRAVENOUS | Status: AC
  Administered 2017-02-02: 12:00:00
  Filled 2017-02-02: qty 50

## 2017-02-02 MED ORDER — MORPHINE SULFATE (PF) 2 MG/ML IV SOLN
2.0000 mg | INTRAVENOUS | Status: DC | PRN
  Administered 2017-02-02 – 2017-02-07 (×3): 2 mg via INTRAVENOUS
  Filled 2017-02-02 (×3): qty 1

## 2017-02-02 MED ORDER — HYDROMORPHONE HCL 1 MG/ML IJ SOLN
INTRAMUSCULAR | Status: AC
  Filled 2017-02-02: qty 1

## 2017-02-02 MED ORDER — ATORVASTATIN CALCIUM 10 MG PO TABS
10.0000 mg | ORAL_TABLET | Freq: Every day | ORAL | Status: DC
  Administered 2017-02-02 – 2017-02-06 (×5): 10 mg via ORAL
  Filled 2017-02-02 (×5): qty 1

## 2017-02-02 MED ORDER — ONDANSETRON HCL 4 MG PO TABS: 4.0000 mg | ORAL_TABLET | Freq: Four times a day (QID) | ORAL | Status: DC | PRN

## 2017-02-02 MED ORDER — DEXTROSE 5 % IV SOLN
INTRAVENOUS | Status: AC
  Administered 2017-02-02: 1 mL via INTRAVENOUS

## 2017-02-02 MED ORDER — ACETAMINOPHEN 650 MG RE SUPP: 650.0000 mg | Freq: Four times a day (QID) | RECTAL | Status: DC | PRN

## 2017-02-02 MED ORDER — BUDESONIDE 0.25 MG/2ML IN SUSP
0.2500 mg | Freq: Two times a day (BID) | RESPIRATORY_TRACT | Status: DC
  Administered 2017-02-02 – 2017-02-07 (×8): 0.25 mg via RESPIRATORY_TRACT
  Filled 2017-02-02 (×11): qty 2

## 2017-02-02 MED ORDER — INSULIN ASPART 100 UNIT/ML ~~LOC~~ SOLN: 0.0000 [IU] | SUBCUTANEOUS | Status: DC

## 2017-02-02 MED ORDER — METOPROLOL SUCCINATE ER 25 MG PO TB24
12.5000 mg | ORAL_TABLET | Freq: Two times a day (BID) | ORAL | Status: DC
  Administered 2017-02-02 – 2017-02-07 (×7): 12.5 mg via ORAL
  Filled 2017-02-02 (×9): qty 1

## 2017-02-02 MED ORDER — ALTEPLASE 2 MG IJ SOLR: 2.0000 mg | Freq: Once | INTRAMUSCULAR | Status: DC | PRN

## 2017-02-02 MED ORDER — MIDAZOLAM HCL 2 MG/2ML IJ SOLN
INTRAMUSCULAR | Status: AC | PRN
  Administered 2017-02-02: 1 mg via INTRAVENOUS

## 2017-02-02 MED ORDER — LIDOCAINE HCL (PF) 1 % IJ SOLN
INTRAMUSCULAR | Status: AC | PRN
  Administered 2017-02-02: 5 mL

## 2017-02-02 MED ORDER — ALBUTEROL SULFATE (2.5 MG/3ML) 0.083% IN NEBU: 2.5000 mg | INHALATION_SOLUTION | Freq: Four times a day (QID) | RESPIRATORY_TRACT | Status: DC | PRN

## 2017-02-02 MED ORDER — ONDANSETRON HCL 4 MG/2ML IJ SOLN: 4.0000 mg | Freq: Four times a day (QID) | INTRAMUSCULAR | Status: DC | PRN

## 2017-02-02 MED ORDER — MIDAZOLAM HCL 2 MG/2ML IJ SOLN
INTRAMUSCULAR | Status: AC
  Filled 2017-02-02: qty 6

## 2017-02-02 MED ORDER — LIDOCAINE HCL (PF) 1 % IJ SOLN
INTRAMUSCULAR | Status: AC
  Filled 2017-02-02: qty 10

## 2017-02-02 MED ORDER — PIPERACILLIN-TAZOBACTAM 3.375 G IVPB
3.3750 g | Freq: Two times a day (BID) | INTRAVENOUS | Status: DC
  Administered 2017-02-02 – 2017-02-05 (×7): 3.375 g via INTRAVENOUS
  Filled 2017-02-02 (×8): qty 50

## 2017-02-02 MED ORDER — ACETAMINOPHEN 325 MG PO TABS
650.0000 mg | ORAL_TABLET | Freq: Four times a day (QID) | ORAL | Status: DC | PRN
Start: 2017-02-02 — End: 2017-02-05
  Administered 2017-02-03: 650 mg via ORAL
  Filled 2017-02-02: qty 2

## 2017-02-02 MED ORDER — METOPROLOL TARTRATE 5 MG/5ML IV SOLN
2.5000 mg | Freq: Three times a day (TID) | INTRAVENOUS | Status: DC
  Filled 2017-02-02: qty 5

## 2017-02-02 MED ORDER — AMIODARONE HCL 200 MG PO TABS
200.0000 mg | ORAL_TABLET | Freq: Every day | ORAL | Status: DC
  Administered 2017-02-02 – 2017-02-07 (×5): 200 mg via ORAL
  Filled 2017-02-02 (×5): qty 1

## 2017-02-02 MED ORDER — PRO-STAT SUGAR FREE PO LIQD
30.0000 mL | Freq: Two times a day (BID) | ORAL | Status: DC
  Administered 2017-02-05 – 2017-02-06 (×2): 30 mL via ORAL
  Filled 2017-02-02 (×8): qty 30

## 2017-02-02 MED ORDER — DEXTROSE 50 % IV SOLN
INTRAVENOUS | Status: AC
  Administered 2017-02-02: 50 mL
  Filled 2017-02-02: qty 50

## 2017-02-02 MED ORDER — HEPARIN SODIUM (PORCINE) 1000 UNIT/ML DIALYSIS
1000.0000 [IU] | INTRAMUSCULAR | Status: DC | PRN
  Filled 2017-02-02: qty 1

## 2017-02-02 MED ORDER — HEPARIN SODIUM (PORCINE) 5000 UNIT/ML IJ SOLN
5000.0000 [IU] | Freq: Three times a day (TID) | INTRAMUSCULAR | Status: DC
  Administered 2017-02-02 – 2017-02-07 (×14): 5000 [IU] via SUBCUTANEOUS
  Filled 2017-02-02 (×14): qty 1

## 2017-02-02 MED ORDER — FENTANYL CITRATE (PF) 100 MCG/2ML IJ SOLN
INTRAMUSCULAR | Status: AC
  Filled 2017-02-02: qty 4

## 2017-02-02 MED ORDER — NITROGLYCERIN 0.4 MG SL SUBL: 0.4000 mg | SUBLINGUAL_TABLET | SUBLINGUAL | Status: DC | PRN

## 2017-02-02 MED ORDER — HYDROMORPHONE HCL 1 MG/ML IJ SOLN
INTRAMUSCULAR | Status: AC | PRN
  Administered 2017-02-02: 1 mg via INTRAVENOUS

## 2017-02-02 MED ORDER — HEPARIN SODIUM (PORCINE) 1000 UNIT/ML DIALYSIS
6000.0000 [IU] | INTRAMUSCULAR | Status: DC | PRN
  Filled 2017-02-02: qty 6

## 2017-02-02 MED ORDER — IOPAMIDOL (ISOVUE-300) INJECTION 61%
INTRAVENOUS | Status: AC
  Administered 2017-02-02: 10 mL
  Filled 2017-02-02: qty 50

## 2017-02-02 NOTE — Procedures (Signed)
Interventional Radiology Procedure Note  Procedure: Transhepatic percutaneous cholecystostomy placement, 102F.  Complications: None  Estimated Blood Loss: None  Recommendations: - Bag to drainage - Follow up in drain clinic in 2-4 weeks  Signed,  Sterling Big, MD

## 2017-02-02 NOTE — H&P (Signed)
History and Physical    Glennys Schorsch ZOX:[id number] DOB: 26-Jan-1951 DOA: 02/01/2017  PCP: [name], MD  Patient coming from: Home.  Chief Complaint: Abdominal pain.  HPI: Brandy Dudley is a 66 y.o. female with history of chronic systolic heart failure status post AICD placement, CAD status post stenting, ESRD on hemodialysis on Tuesdays and Thursdays and Saturday, diabetes mellitus presents to the ER with complaints of persistent abdominal pain mostly postprandial. Denies nausea vomiting or diarrhea. Patient did not go for dialysis on Tuesday because of the pain but did go to the dialysis yesterday. Denies any chest pain or shortness of breath.   ED Course: CT scan in the ER shows features concerning for acute cholecystitis. Dr. Janee Morn on-call general surgeon was consulted and patient being admitted for further management of acute cholecystitis. Patient was started on IV Zosyn.  Review of Systems: As per HPI, rest all negative.   Past Medical History:  Diagnosis Date  . AICD (automatic cardioverter/defibrillator) present    Biotronik Inventra 7 VR-T DX 04/12/15  . Anemia   . Anxiety   . C. difficile colitis   . Cardiomyopathy (HCC)    02/02/16 ([facility]): Mixed ischemic and non-ischemic cardiomyopathy  . CHF (congestive heart failure) (HCC)    systolic  . COPD (chronic obstructive pulmonary disease) (HCC)   . Coronary artery disease   . Diabetes mellitus (HCC)   . DVT (deep venous thrombosis) (HCC)    on coumadin  . ESRD (end stage renal disease) (HCC)    tues/thurs/sat dialysis  . Hypertension   . Neuropathy (HCC)   . Obstructive sleep apnea    no cpap  . Renal disorder   . Wears glasses     Past Surgical History:  Procedure Laterality Date  . AV FISTULA PLACEMENT Left 04/03/2016   Procedure: ARTERIOVENOUS (AV) FISTULA CREATION;  Surgeon: Larina Earthly, MD;  Location: [facility] OR;  Service: Vascular;  Laterality: Left;  . AV FISTULA PLACEMENT Right 08/21/2016   Procedure:  INSERTION RIGHT ARM ARTERIOVENOUS GRAFT USING 4-7MM X45  CM ACUSEAL GRAFT;  Surgeon: Maeola Harman, MD;  Location: [facility] OR;  Service: Vascular;  Laterality: Right;  . CORONARY ANGIOPLASTY     LCX stent 2010; by [facility] 01/2016 note: 03/2014 LHC/RHC: minor luminal irregularities LAD and RCA with patent stent first marginal branch of CX. RA 11, PAP 58/22 with mean 39, mean wedge pressure 19, LVEDP 27, PVR 3.9, CO 5.1, Cardiac index 2.5.   . FISTULA SUPERFICIALIZATION Left 06/16/2016   Procedure: SUPERFICIALIZATION OF LEFT ARM BRACHIOCEPHALIC ARTERIOVENOUS FISTULA;  Surgeon: Chuck Hint, MD;  Location: [facility] OR;  Service: Vascular;  Laterality: Left;  . INSERTION OF DIALYSIS CATHETER Left 04/03/2016   Procedure: INSERTION OF DIALYSIS CATHETER;  Surgeon: Larina Earthly, MD;  Location: [facility] OR;  Service: Vascular;  Laterality: Left;  . LIGATION OF COMPETING BRANCHES OF ARTERIOVENOUS FISTULA Left 06/16/2016   Procedure: LIGATION OF COMPETING BRANCHES OF left brachiocephalic ARTERIOVENOUS FISTULA;  Surgeon: Chuck Hint, MD;  Location: [facility] OR;  Service: Vascular;  Laterality: Left;  . PERIPHERAL VASCULAR CATHETERIZATION N/A 07/20/2016   Procedure: A/V Shuntogram/Fistulagram;  Surgeon: Fransisco Hertz, MD;  Location: [facility] INVASIVE CV LAB;  Service: Cardiovascular;  Laterality: N/A;  . PERIPHERAL VASCULAR CATHETERIZATION N/A 07/20/2016   Procedure: Dialysis/Perma Catheter Insertion;  Surgeon: Fransisco Hertz, MD;  Location: [facility] INVASIVE CV LAB;  Service: Cardiovascular;  Laterality: N/A;  . REVISION OF ARTERIOVENOUS GORETEX GRAFT Left 06/16/2016  Procedure: REVISION OF LEFT BRACHIOCEPHALIC ARTERIOVENOUS FISTULA WITH RESECTION OF REDUNDANT SECTION;  Surgeon: Chuck Hint, MD;  Location: [facility] OR;  Service: Vascular;  Laterality: Left;     reports that she quit smoking about 10 years ago. Her smoking use included Cigarettes. She has never used smokeless tobacco. She reports that she does not  drink alcohol or use drugs.  Allergies  Allergen Reactions  . Entresto [Sacubitril-Valsartan] Other (See Comments)     unknown reaction  . Latex Itching  . Sulfa Antibiotics Itching    History reviewed. No pertinent family history.  Prior to Admission medications   Medication Sig Start Date End Date Taking? Authorizing Provider  acetaminophen (TYLENOL) 325 MG tablet Take 650 mg by mouth every 6 (six) hours as needed for mild pain or fever (For fever >99.5).    Yes Historical Provider, MD  albuterol (PROAIR HFA) 108 (90 Base) MCG/ACT inhaler Inhale 2 puffs into the lungs every 6 (six) hours as needed for wheezing or shortness of breath.    Yes Historical Provider, MD  amiodarone (PACERONE) 200 MG tablet Take 1 tablet (200 mg total) by mouth daily. 10/11/16  Yes Marinus Maw, MD  atorvastatin (LIPITOR) 10 MG tablet Take 10 mg by mouth at bedtime.    Yes Historical Provider, MD  famotidine (PEPCID) 20 MG tablet Take 1 tablet (20 mg total) by mouth at bedtime. 07/22/16  Yes Maryann Mikhail, DO  fluticasone (FLOVENT HFA) 110 MCG/ACT inhaler Inhale 2 puffs into the lungs 2 (two) times daily.    Yes Historical Provider, MD  hydrOXYzine (ATARAX/VISTARIL) 50 MG tablet Take 50 mg by mouth every 6 (six) hours as needed for itching.   Yes Historical Provider, MD  insulin glargine (LANTUS) 100 UNIT/ML injection Inject 8 Units into the skin every morning.    Yes Historical Provider, MD  insulin lispro (HUMALOG) 100 UNIT/ML injection Inject 0-10 Units into the skin 3 (three) times daily as needed for high blood sugar (CBG >180). CBG 0-59 Hypoglycemic Protocol, 60-149 0 units 150-250 5 units, 251-300 8 units, 301-350 10 units, >350 call MD   Yes Historical Provider, MD  Inulin (FIBER CHOICE PO) Take 2 tablets by mouth daily as needed (Constipation).    Yes Historical Provider, MD  lactose free nutrition (BOOST PLUS) LIQD Take 237 mLs by mouth daily. At 9PM   Yes Historical Provider, MD  loperamide  (IMODIUM) 2 MG capsule Take 1 capsule (2 mg total) by mouth 4 (four) times daily as needed for diarrhea or loose stools. 09/16/16  Yes Jacalyn Lefevre, MD  metoprolol succinate (TOPROL-XL) 25 MG 24 hr tablet Take 12.5 mg by mouth 2 (two) times daily. HOLD for SBP <110 and HP <60   Yes Historical Provider, MD  mirtazapine (REMERON) 15 MG tablet Take 15 mg by mouth at bedtime.   Yes Historical Provider, MD  Multiple Vitamins-Minerals (CERTAVITE/ANTIOXIDANTS) TABS Take 1 tablet by mouth at bedtime.   Yes Historical Provider, MD  nitroGLYCERIN (NITROSTAT) 0.4 MG SL tablet Place 0.4 mg under the tongue every 5 (five) minutes as needed for chest pain.   Yes Historical Provider, MD  oxyCODONE-acetaminophen (PERCOCET/ROXICET) 5-325 MG tablet Take one tablet by mouth every 6 hours as needed for mild pain; Take two tablets by mouth every 6 hours as needed for severe pain. Do not exceed 4gm of Tylenol in 24 hours 10/02/16  Yes Tiffany L Reed, DO  OXYGEN Inhale 2 L into the lungs as needed.   Yes Historical Provider,  MD  Propylene Glycol (SYSTANE BALANCE OP) Place 1 drop into both eyes 2 (two) times daily.   Yes Historical Provider, MD  sertraline (ZOLOFT) 25 MG tablet Take 25 mg by mouth daily.    Yes Historical Provider, MD    Physical Exam: Vitals:   02/02/17 0115 02/02/17 0130 02/02/17 0145 02/02/17 0216  BP: 110/67 95/69 101/58 135/69  Pulse: (!) 52 (!) 48 (!) 55 60  Resp: 11 11 11 18   Temp:    97.9 F (36.6 C)  TempSrc:    Oral  SpO2: 94% 92% 91% 98%  Weight:      Height:          Constitutional: Moderately built and nourished. Vitals:   02/02/17 0115 02/02/17 0130 02/02/17 0145 02/02/17 0216  BP: 110/67 95/69 101/58 135/69  Pulse: (!) 52 (!) 48 (!) 55 60  Resp: 11 11 11 18   Temp:    97.9 F (36.6 C)  TempSrc:    Oral  SpO2: 94% 92% 91% 98%  Weight:      Height:       Eyes: Anicteric no pallor. ENMT: No discharge from the ears eyes nose and mouth. Neck: No mass felt. No neck  rigidity. No JVD appreciated. Respiratory: No rhonchi or crepitations. Cardiovascular: S1-S2 heard no murmurs appreciated. Abdomen: Tenderness in epigastric area. No guarding or rigidity. Musculoskeletal: No edema. No joint effusion. Skin: No rash. Skin appears warm. Neurologic: Alert awake oriented to time place and person. Moves all extremities. Psychiatric: Appears normal. Normal affect.   Labs on Admission: I have personally reviewed following labs and imaging studies  CBC:  Recent Labs Lab 02/01/17 1924  WBC 10.7*  NEUTROABS 8.6*  HGB 9.2*  HCT 30.3*  MCV 81.7  PLT 287   Basic Metabolic Panel:  Recent Labs Lab 02/01/17 1924  NA 135  K 3.0*  CL 98*  CO2 28  GLUCOSE 80  BUN 18  CREATININE 2.91*  CALCIUM 7.8*   GFR: Estimated Creatinine Clearance: 18.6 mL/min (by C-G formula based on SCr of 2.91 mg/dL (H)). Liver Function Tests:  Recent Labs Lab 02/01/17 1924  AST 15  ALT 9*  ALKPHOS 199*  BILITOT 0.6  PROT 8.3*  ALBUMIN 1.8*    Recent Labs Lab 02/01/17 1924  LIPASE 19   No results for input(s): AMMONIA in the last 168 hours. Coagulation Profile: No results for input(s): INR, PROTIME in the last 168 hours. Cardiac Enzymes: No results for input(s): CKTOTAL, CKMB, CKMBINDEX, TROPONINI in the last 168 hours. BNP (last 3 results) No results for input(s): PROBNP in the last 8760 hours. HbA1C: No results for input(s): HGBA1C in the last 72 hours. CBG: No results for input(s): GLUCAP in the last 168 hours. Lipid Profile: No results for input(s): CHOL, HDL, LDLCALC, TRIG, CHOLHDL, LDLDIRECT in the last 72 hours. Thyroid Function Tests: No results for input(s): TSH, T4TOTAL, FREET4, T3FREE, THYROIDAB in the last 72 hours. Anemia Panel: No results for input(s): VITAMINB12, FOLATE, FERRITIN, TIBC, IRON, RETICCTPCT in the last 72 hours. Urine analysis:    Component Value Date/Time   COLORURINE AMBER (A) 07/15/2016 1636   APPEARANCEUR TURBID (A)  07/15/2016 1636   LABSPEC 1.025 07/15/2016 1636   PHURINE 5.0 07/15/2016 1636   GLUCOSEU NEGATIVE 07/15/2016 1636   HGBUR MODERATE (A) 07/15/2016 1636   BILIRUBINUR MODERATE (A) 07/15/2016 1636   KETONESUR 15 (A) 07/15/2016 1636   PROTEINUR 100 (A) 07/15/2016 1636   NITRITE NEGATIVE 07/15/2016 1636   LEUKOCYTESUR MODERATE (  A) 07/15/2016 1636   Sepsis Labs: @LABRCNTIP (procalcitonin:4,lacticidven:4) )No results found for this or any previous visit (from the past 240 hour(s)).   Radiological Exams on Admission: Ct Abdomen Pelvis W Contrast  Result Date: 02/01/2017 CLINICAL DATA:  Initial evaluation for right-sided abdominal pain for 1 month. EXAM: CT ABDOMEN AND PELVIS WITH CONTRAST TECHNIQUE: Multidetector CT imaging of the abdomen and pelvis was performed using the standard protocol following bolus administration of intravenous contrast. CONTRAST:  ISOVUE-300 IOPAMIDOL (ISOVUE-300) INJECTION 61% COMPARISON:  Prior CT from 08/26/2016. FINDINGS: Lower chest: Small layering bilateral pleural effusions with associated bibasilar atelectasis. Cardiomegaly with pacemaker electrodes partially visualized. Hepatobiliary: Few scattered subcentimeter hypodensities noted within the liver, too small the characterize, but may reflect small cyst. Liver otherwise unremarkable. Mucosal edema with mild fuzzy stranding seen about the gallbladder, which may reflect sequelae of acute cholecystitis. Question trace free pericholecystic fluid. No definite internal stones identified. No appreciable biliary dilatation. Pancreas: Pancreas within normal limits. Spleen: Spleen within normal limits. Adrenals/Urinary Tract: Adrenal glands are normal. Kidneys equal in size with symmetric enhancement. Few scattered subcentimeter hypodensities noted within the right kidney, too small the characterize, but statistically likely reflects small cyst. Kidneys otherwise unremarkable without nephrolithiasis, hydronephrosis, or focal  enhancing renal mass. No hydroureter. Bladder largely decompressed. Circumferential bladder wall thickening likely related incomplete distension. Stomach/Bowel: Stomach within normal limits. No evidence for bowel obstruction. Appendix well visualized within the right lower quadrant and is within normal limits without associated inflammatory changes to suggest acute appendicitis. No acute inflammatory changes seen about the bowels. Large amount of impacted stool within the rectal vault, suggesting constipation, similar to previous. Vascular/Lymphatic: Moderate aorto bi-iliac atherosclerotic disease. No aneurysm. No pathologically enlarged intra-abdominal or pelvic lymph nodes. Reproductive: Small amount of fluid present within the endometrial canal. Uterus otherwise unremarkable. Ovaries within normal limits. Other: No free air or fluid. Fat containing paraumbilical hernia noted. With without closely approximated at the base of the hernia without associated inflammation or obstruction. Musculoskeletal: Mild diffuse anasarca noted, likely related to overall volume status. Progressive height loss with endplate irregularity within the visualized vertebral bodies of the thoracic spine, suspected to be related to dialysis/renal osteodystrophy given patient history. No definite acute osseous abnormality. Bilateral facet arthrosis noted within the lower lumbar spine. No other worrisome lytic or blastic osseous lesions. IMPRESSION: 1. Mild mucosal enhancement with small volume free pericholecystic fluid and subtle hazy stranding about the gallbladder. Findings may reflect sequelae of acute cholecystitis. Correlation were laboratory values recommended. Additionally, this could be further assessed with dedicated right upper quadrant ultrasound as indicated. 2. Large amount of impacted stool within the rectal vault, similar to previous. 3. Cardiomegaly with bilateral pleural effusions and diffuse anasarca. Associated bibasilar  atelectasis within the lung bases. 4. 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 Electronically Signed   By: Rise Mu M.D.   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 Acute cholecystitis - appreciate general surgery consult. Patient is on IV Zosyn. Patient will be kept nothing by mouth. General surgery is planning either cholecystostomy or cholecystectomy. Will need cardiology clearance. Please consult cardiology  in a.m. 2. ESRD on hemodialysis on Tuesday Thursday and Saturday - nephrology consult for dialysis. Patient appears euvolemic. 3. Diabetes mellitus type 2 - will place patient on sliding scale coverage. 4. Hypertension and history of paroxysmal atrial fibrillation - I have placed patient on scheduled IV metoprolol. 5. History of CAD status post stenting - denies any chest pain. 6. Chronic systolic heart failure status post AICD placement - volume management per nephrology. 7. Anemia probably from ESRD - follow CBC.   DVT prophylaxis: SCDs. Code Status: Full code.  Family Communication: Discussed with patient.  Disposition Plan: Home.  Consults called: General surgery.  Admission status: Inpatient.    Eduard Clos MD [facility] Pager [phone].  If 7PM-7AM, please contact night-coverage [email or web address] Password [email or web address]  02/02/2017, 3:19 AM

## 2017-02-02 NOTE — Progress Notes (Addendum)
Pharmacy Antibiotic Consult  Brandy Dudley is a 66 y.o. female admitted on 02/01/2017 with cholecystitis.  Pharmacy has been consulted for Zosyn dosing.  Plan: Zosyn 3.375g IV q12h (4 hour infusion).  Height: 5\' 4"  (162.6 cm) Weight: 160 lb (72.6 kg) IBW/kg (Calculated) : 54.7  Temp (24hrs), Avg:98.1 F (36.7 C), Min:97.9 F (36.6 C), Max:98.3 F (36.8 C)   Recent Labs Lab 02/01/17 1924  WBC 10.7*  CREATININE 2.91*    Estimated Creatinine Clearance: 18.6 mL/min (by C-G formula based on SCr of 2.91 mg/dL (H)).    Allergies  Allergen Reactions  . Entresto [Sacubitril-Valsartan] Other (See Comments)     unknown reaction  . Latex Itching  . Sulfa Antibiotics Itching     Thank you for allowing pharmacy to be a part of this patient's care.  Vernard Gambles, PharmD, BCPS    ================   Addendum:  Pharmacy will sign off as dosage adjustment is unnecessary.  Thank you for the consult!  Linford Quintela D. Laney Potash, PharmD, BCPS Pager:  [phone] 02/02/2017, 9:01 AM

## 2017-02-02 NOTE — Progress Notes (Signed)
Hypoglycemic Event  CBG: 47  Treatment: D50 IV 50 mL  Symptoms: Hungry  Follow-up CBG: [id number] CBG Result:146  Possible Reasons for Event: Inadequate meal intake  Comments/MD notified:Kirby, NP notified.    Jamel Holzmann N [place] Hagger

## 2017-02-02 NOTE — ED Notes (Signed)
Dr. Thompson at bedside. 

## 2017-02-02 NOTE — Progress Notes (Signed)
Initial Nutrition Assessment  DOCUMENTATION CODES:   Not applicable  INTERVENTION:   -RD will follow for diet advancement and supplement as appropriate  NUTRITION DIAGNOSIS:   Inadequate oral intake related to altered GI function as evidenced by NPO status.  GOAL:   Patient will meet greater than or equal to 90% of their needs  MONITOR:   Diet advancement, Labs, Weight trends, Skin, I & O's  REASON FOR ASSESSMENT:   Malnutrition Screening Tool    ASSESSMENT:   Brandy Dudley is a 66 y.o. female with history of chronic systolic heart failure status post AICD placement, CAD status post stenting, ESRD on hemodialysis on Tuesdays and Thursdays and Saturday, diabetes mellitus presents to the ER with complaints of persistent abdominal pain mostly postprandial. Denies nausea vomiting or diarrhea. Patient did not go for dialysis on Tuesday because of the pain but did go to the dialysis yesterday. Denies any chest pain or shortness of breath.   Pt admitted with acute cholecystitis.   Spoke with pt at bedside, who is a long term resident of [facility]. She shares that she has experienced poor appetite over the 2-3 related to pneumonia and, more recently, stomach pain over the past week ("it hurts when I eat"). Pt reports appetite is generally fair and consumes 2-3 meals and 3 snacks between meals. Intake can very based upon what is served at [facility]. Pt reports she does not take supplements regularly, but has taken Glucerna in the past.   Pt endorses wt loss, however, unable to quantify amount or time frame. Per wt hx, wt has been stable over the past 6 months.   Nutrition-Focused physical exam completed. Findings are no fat depletion, no muscle depletion, and no edema. Pt reports she has recently started working with PT PTA for increasing mobility.   Discussed importance of good meal intake once diet is advanced to promote healing, preserve lean body mass, and prevent weight loss. Pt had very  specific questions regarding the renal diet, such as the ability to eat prunes to help with BMs. Pt understanding that prunes are a high K food; deferred to RD at HD center or SNF, as allowed foods on HD diet can vary based upon lab work. Also encouraged pt to discuss concerns about bowel function with RN and MD.  Labs reviewed: CBGS: 57-187.   Diet Order:  Diet NPO time specified Except for: Sips with Meds  Skin:  Reviewed, no issues  Last BM:  02/01/17  Height:   Ht Readings from Last 1 Encounters:  02/01/17 5\' 4"  (1.626 m)    Weight:   Wt Readings from Last 1 Encounters:  02/01/17 160 lb (72.6 kg)    Ideal Body Weight:  54.5 kg  BMI:  Body mass index is 27.46 kg/m.  Estimated Nutritional Needs:   Kcal:  1600-1800  Protein:  80-95 grams  Fluid:  per MD  EDUCATION NEEDS:   Education needs addressed  Lamira Borin A. Mayford Knife, RD, LDN, CDE Pager: [phone] After hours Pager: [phone]

## 2017-02-02 NOTE — Sedation Documentation (Signed)
Patient is resting comfortably. 

## 2017-02-02 NOTE — Consult Note (Signed)
CARDIOLOGY CONSULT NOTE   Patient ID: Brandy [place] MRN: [id number] DOB/AGE: 1951/01/02 66 y.o.  Admit date: 02/01/2017  Requesting Physician: Dr. Thedore Mins Primary Physician:   [name], MD Primary Cardiologist:  Previously followed in [place]. Was seen by Dr. Shirlee Latch / Dr. Anne Fu in 02/2016 Electrophysiology: Dr. Ladona Ridgel Reason for Consultation:  Pre op clearance - acute cholecystitis   HPI: Brandy [place] is a 66 y.o. 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 She was found to have acute cholecystitis and cardiology asked for clearance for cholecystectomy.   Was admitted to [facility] in [place] with prolonged admission from at least 3/15 to 03/01/16 after a complicated admission which included A/C systolic HF and AKI. She had required dialysis during a previous admission. Paperwork states Echo 02/09/16 with EF 22% with RV enlargement, LAE, RAE, severe TR, and mild to mod MR.   She was admitted to [facility] in 02/2016 for C. Diff colitis with significant diarrhea. Dr. Shirlee Latch saw during this admission for help with IVFs given cardiomyopathy. 2D ECHO during that admission showed EF 35-40%, diffuse HK, G2DD, mild MR, mild R/LAE, mod RV dilation, severe TR, small pericardial effusion.   She has since been followed by Dr Ladona Ridgel for her ICD. She has been found to have PAF and VT through device interrogation. She was placed on amiodarone for both. She was felt not to be a good candidate for oral anticoagulation. She was last seen in 09/2016.  She presented to [facility] on 02/02/17 with abdominal pain and found to have acute cholecystitis. She was made NPO and placed on IV zosyn. Cardiology asked for pre op clearance. No CP. No LE edema, orthopnea or PND. No  dizziness or syncope. No blood in stool or urine. No palpitations. She is very feeble and walks with a walker but stays in a wheelchair most often given leg weakness and SOB. She continues to have abdominal pain and wants to eat.     Past Medical History:  Diagnosis Date  . AICD (automatic cardioverter/defibrillator) present    Biotronik Inventra 7 VR-T DX 04/12/15  . Anemia   . Anxiety   . C. difficile colitis   . Cardiomyopathy (HCC)    02/02/16 ([facility]): Mixed ischemic and non-ischemic cardiomyopathy  . CHF (congestive heart failure) (HCC)    systolic  . COPD (chronic obstructive pulmonary disease) (HCC)   . Coronary artery disease   . Diabetes mellitus (HCC)   . DVT (deep venous thrombosis) (HCC)    on coumadin  . ESRD (end stage renal disease) (HCC)    tues/thurs/sat dialysis  . Hypertension   . Neuropathy (HCC)   . Obstructive sleep apnea    no cpap  . Renal disorder   . Wears glasses      Past Surgical History:  Procedure Laterality Date  . AV FISTULA PLACEMENT Left 04/03/2016   Procedure: ARTERIOVENOUS (AV) FISTULA CREATION;  Surgeon: Larina Earthly, MD;  Location: [facility] OR;  Service: Vascular;  Laterality: Left;  . AV FISTULA PLACEMENT Right 08/21/2016   Procedure: INSERTION RIGHT ARM ARTERIOVENOUS GRAFT USING 4-7MM X45  CM ACUSEAL GRAFT;  Surgeon: Maeola Harman, MD;  Location: [facility] OR;  Service: Vascular;  Laterality: Right;  .  CORONARY ANGIOPLASTY     LCX stent 2010; by [facility] 01/2016 note: 03/2014 LHC/RHC: minor luminal irregularities LAD and RCA with patent stent first marginal branch of CX. RA 11, PAP 58/22 with mean 39, mean wedge pressure 19, LVEDP 27, PVR 3.9, CO 5.1, Cardiac index 2.5.   . FISTULA SUPERFICIALIZATION Left 06/16/2016   Procedure: SUPERFICIALIZATION OF LEFT ARM BRACHIOCEPHALIC ARTERIOVENOUS FISTULA;  Surgeon: Chuck Hint, MD;  Location: [facility] OR;  Service: Vascular;  Laterality: Left;  . INSERTION OF DIALYSIS CATHETER Left  04/03/2016   Procedure: INSERTION OF DIALYSIS CATHETER;  Surgeon: Larina Earthly, MD;  Location: [facility] OR;  Service: Vascular;  Laterality: Left;  . LIGATION OF COMPETING BRANCHES OF ARTERIOVENOUS FISTULA Left 06/16/2016   Procedure: LIGATION OF COMPETING BRANCHES OF left brachiocephalic ARTERIOVENOUS FISTULA;  Surgeon: Chuck Hint, MD;  Location: [facility] OR;  Service: Vascular;  Laterality: Left;  . PERIPHERAL VASCULAR CATHETERIZATION N/A 07/20/2016   Procedure: A/V Shuntogram/Fistulagram;  Surgeon: Fransisco Hertz, MD;  Location: [facility] INVASIVE CV LAB;  Service: Cardiovascular;  Laterality: N/A;  . PERIPHERAL VASCULAR CATHETERIZATION N/A 07/20/2016   Procedure: Dialysis/Perma Catheter Insertion;  Surgeon: Fransisco Hertz, MD;  Location: [facility] INVASIVE CV LAB;  Service: Cardiovascular;  Laterality: N/A;  . REVISION OF ARTERIOVENOUS GORETEX GRAFT Left 06/16/2016   Procedure: REVISION OF LEFT BRACHIOCEPHALIC ARTERIOVENOUS FISTULA WITH RESECTION OF REDUNDANT SECTION;  Surgeon: Chuck Hint, MD;  Location: [facility] OR;  Service: Vascular;  Laterality: Left;    Allergies  Allergen Reactions  . Entresto [Sacubitril-Valsartan] Other (See Comments)     unknown reaction  . Latex Itching  . Sulfa Antibiotics Itching    I have reviewed the patient's current medications . budesonide  0.25 mg Nebulization BID  . dextrose      . insulin aspart  0-9 Units Subcutaneous Q8H  . metoprolol  2.5 mg Intravenous Q8H  . piperacillin-tazobactam (ZOSYN)  IV  3.375 g Intravenous Q12H   . dextrose     acetaminophen **OR** acetaminophen, albuterol, morphine injection, nitroGLYCERIN, ondansetron **OR** ondansetron (ZOFRAN) IV  Prior to Admission medications   Medication Sig Start Date End Date Taking? Authorizing Provider  acetaminophen (TYLENOL) 325 MG tablet Take 650 mg by mouth every 6 (six) hours as needed for mild pain or fever (For fever >99.5).    Yes Historical Provider, MD  albuterol (PROAIR HFA) 108 (90 Base)  MCG/ACT inhaler Inhale 2 puffs into the lungs every 6 (six) hours as needed for wheezing or shortness of breath.    Yes Historical Provider, MD  amiodarone (PACERONE) 200 MG tablet Take 1 tablet (200 mg total) by mouth daily. 10/11/16  Yes Marinus Maw, MD  atorvastatin (LIPITOR) 10 MG tablet Take 10 mg by mouth at bedtime.    Yes Historical Provider, MD  famotidine (PEPCID) 20 MG tablet Take 1 tablet (20 mg total) by mouth at bedtime. 07/22/16  Yes Maryann Mikhail, DO  fluticasone (FLOVENT HFA) 110 MCG/ACT inhaler Inhale 2 puffs into the lungs 2 (two) times daily.    Yes Historical Provider, MD  hydrOXYzine (ATARAX/VISTARIL) 50 MG tablet Take 50 mg by mouth every 6 (six) hours as needed for itching.   Yes Historical Provider, MD  insulin glargine (LANTUS) 100 UNIT/ML injection Inject 8 Units into the skin every morning.    Yes Historical Provider, MD  insulin lispro (HUMALOG) 100 UNIT/ML injection Inject 0-10 Units into the skin 3 (three) times daily as needed for high blood sugar (CBG >180). CBG  0-59 Hypoglycemic Protocol, 60-149 0 units 150-250 5 units, 251-300 8 units, 301-350 10 units, >350 call MD   Yes Historical Provider, MD  Inulin (FIBER CHOICE PO) Take 2 tablets by mouth daily as needed (Constipation).    Yes Historical Provider, MD  lactose free nutrition (BOOST PLUS) LIQD Take 237 mLs by mouth daily. At 9PM   Yes Historical Provider, MD  loperamide (IMODIUM) 2 MG capsule Take 1 capsule (2 mg total) by mouth 4 (four) times daily as needed for diarrhea or loose stools. 09/16/16  Yes Jacalyn Lefevre, MD  metoprolol succinate (TOPROL-XL) 25 MG 24 hr tablet Take 12.5 mg by mouth 2 (two) times daily. HOLD for SBP <110 and HP <60   Yes Historical Provider, MD  mirtazapine (REMERON) 15 MG tablet Take 15 mg by mouth at bedtime.   Yes Historical Provider, MD  Multiple Vitamins-Minerals (CERTAVITE/ANTIOXIDANTS) TABS Take 1 tablet by mouth at bedtime.   Yes Historical Provider, MD  nitroGLYCERIN  (NITROSTAT) 0.4 MG SL tablet Place 0.4 mg under the tongue every 5 (five) minutes as needed for chest pain.   Yes Historical Provider, MD  oxyCODONE-acetaminophen (PERCOCET/ROXICET) 5-325 MG tablet Take one tablet by mouth every 6 hours as needed for mild pain; Take two tablets by mouth every 6 hours as needed for severe pain. Do not exceed 4gm of Tylenol in 24 hours 10/02/16  Yes Tiffany L Reed, DO  OXYGEN Inhale 2 L into the lungs as needed.   Yes Historical Provider, MD  Propylene Glycol (SYSTANE BALANCE OP) Place 1 drop into both eyes 2 (two) times daily.   Yes Historical Provider, MD  sertraline (ZOLOFT) 25 MG tablet Take 25 mg by mouth daily.    Yes Historical Provider, MD     Social History   Social History  . Marital status: Single    Spouse name: N/A  . Number of children: N/A  . Years of education: N/A   Occupational History  . Not on file.   Social History Main Topics  . Smoking status: Former Smoker    Types: Cigarettes    Quit date: 03/03/2006  . Smokeless tobacco: Never Used  . Alcohol use No  . Drug use: No  . Sexual activity: No   Other Topics Concern  . Not on file   Social History Narrative   Admit to [facility] 04/10/16   Never married   Former smoker - 2007   Alcohol none   Full Code    Family Status  Relation Status  . Sister Alive  . Sister Alive  . Sister Alive   History reviewed. No pertinent family history.     ROS:  Full 14 point review of systems complete and found to be negative unless listed above.  Physical Exam: Blood pressure (!) 121/58, pulse (!) 58, temperature 97.7 F (36.5 C), temperature source Oral, resp. rate 18, height 5\' 4"  (1.626 m), weight 160 lb (72.6 kg), SpO2 95 %.  General: Well developed, well nourished, female in no acute distress, appears older than stated age and chronically ill Head: Eyes PERRLA, No xanthomas.   Normocephalic and atraumatic, oropharynx without edema or exudate.  Lungs: CTAB Heart: HRRR S1 S2, no  rub/gallop, Heart regular rate and rhythm with S1, S2 no murmur. pulses are 2+ extrem.   Neck: No carotid bruits. No lymphadenopathy.  No JVD. Abdomen: Bowel sounds present, abdomen soft and non-tender without masses or hernias noted. Msk:  No spine or cva tenderness. No weakness, no  joint deformities or effusions. Extremities: No clubbing or cyanosis.  1+ bilateral LE edema.  Neuro: Alert and oriented X 3. No focal deficits noted. Psych:  Good affect, responds appropriately Skin: No rashes or lesions noted.  Labs:   Lab Results  Component Value Date   WBC 10.7 (H) 02/01/2017   HGB 9.2 (L) 02/01/2017   HCT 30.3 (L) 02/01/2017   MCV 81.7 02/01/2017   PLT 287 02/01/2017   No results for input(s): INR in the last 72 hours.   Recent Labs Lab 02/01/17 1924  NA 135  K 3.0*  CL 98*  CO2 28  BUN 18  CREATININE 2.91*  CALCIUM 7.8*  PROT 8.3*  BILITOT 0.6  ALKPHOS 199*  ALT 9*  AST 15  GLUCOSE 80  ALBUMIN 1.8*   Magnesium  Date Value Ref Range Status  07/15/2016 2.2 1.7 - 2.4 mg/dL Final   No results for input(s): CKTOTAL, CKMB, TROPONINI in the last 72 hours. No results for input(s): TROPIPOC in the last 72 hours. No results found for: PROBNP Lab Results  Component Value Date   CHOL 147 12/25/2016   HDL 40 12/25/2016   LDLCALC 90 12/25/2016   TRIG 87 12/25/2016   No results found for: DDIMER Lipase  Date/Time Value Ref Range Status  02/01/2017 07:24 PM 19 11 - 51 U/L Final   TSH  Date/Time Value Ref Range Status  09/25/2016 1.19 0.41 - 5.90 uIU/mL Final  03/22/2016 05:10 AM 1.993 0.350 - 4.500 uIU/mL Final   Ferritin  Date/Time Value Ref Range Status  04/04/2016 07:48 AM 181 11 - 307 ng/mL Final   TIBC  Date/Time Value Ref Range Status  04/04/2016 07:48 AM 328 250 - 450 ug/dL Final   Iron  Date/Time Value Ref Range Status  04/04/2016 07:48 AM 31 28 - 170 ug/dL Final    Echo: 16/08/9603 LV EF: 35% -   40% Study Conclusions - Left ventricle: The  cavity size was mildly dilated. Wall   thickness was normal. Systolic function was moderately reduced.   The estimated ejection fraction was in the range of 35% to 40%.   Diffuse hypokinesis. There is akinesis of the inferolateral and   inferior myocardium. Features are consistent with a pseudonormal   left ventricular filling pattern, with concomitant abnormal   relaxation and increased filling pressure (grade 2 diastolic   dysfunction). - Ascending aorta: The ascending aorta was mildly dilated. - Mitral valve: Calcified annulus. Mildly thickened leaflets .   There was mild regurgitation. - Left atrium: The atrium was mildly dilated. - Right ventricle: The cavity size was moderately dilated. - Right atrium: The atrium was moderately dilated. - Atrial septum: The septum bowed from right to left, consistent   with increased right atrial pressure. - Tricuspid valve: There was severe regurgitation. - Pulmonary arteries: PA peak pressure: 38 mm Hg (S). - Pericardium, extracardiac: A small pericardial effusion was   identified. Impressions: - Global hypokinesis with inferior and inferior lateral akinesis;   overall moderately reduced LV function; grade 2 diastolic   dysfunction; mild LAE; moderate RAE/RVE; small pericardial   effusion; mild MR; severe TR.   ECG:  Sinus HR 85- personally reviewed  TELE: sinus with some sinus brady, PVCs and paced beats - personally reviewed  Radiology:  Ct Abdomen Pelvis W Contrast  Result Date: 02/01/2017 CLINICAL DATA:  Initial evaluation for right-sided abdominal pain for 1 month. EXAM: CT ABDOMEN AND PELVIS WITH CONTRAST TECHNIQUE: Multidetector CT imaging of the abdomen  and pelvis was performed using the standard protocol following bolus administration of intravenous contrast. CONTRAST:  ISOVUE-300 IOPAMIDOL (ISOVUE-300) INJECTION 61% COMPARISON:  Prior CT from 08/26/2016. FINDINGS: Lower chest: Small layering bilateral pleural effusions with  associated bibasilar atelectasis. Cardiomegaly with pacemaker electrodes partially visualized. Hepatobiliary: Few scattered subcentimeter hypodensities noted within the liver, too small the characterize, but may reflect small cyst. Liver otherwise unremarkable. Mucosal edema with mild fuzzy stranding seen about the gallbladder, which may reflect sequelae of acute cholecystitis. Question trace free pericholecystic fluid. No definite internal stones identified. No appreciable biliary dilatation. Pancreas: Pancreas within normal limits. Spleen: Spleen within normal limits. Adrenals/Urinary Tract: Adrenal glands are normal. Kidneys equal in size with symmetric enhancement. Few scattered subcentimeter hypodensities noted within the right kidney, too small the characterize, but statistically likely reflects small cyst. Kidneys otherwise unremarkable without nephrolithiasis, hydronephrosis, or focal enhancing renal mass. No hydroureter. Bladder largely decompressed. Circumferential bladder wall thickening likely related incomplete distension. Stomach/Bowel: Stomach within normal limits. No evidence for bowel obstruction. Appendix well visualized within the right lower quadrant and is within normal limits without associated inflammatory changes to suggest acute appendicitis. No acute inflammatory changes seen about the bowels. Large amount of impacted stool within the rectal vault, suggesting constipation, similar to previous. Vascular/Lymphatic: Moderate aorto bi-iliac atherosclerotic disease. No aneurysm. No pathologically enlarged intra-abdominal or pelvic lymph nodes. Reproductive: Small amount of fluid present within the endometrial canal. Uterus otherwise unremarkable. Ovaries within normal limits. Other: No free air or fluid. Fat containing paraumbilical hernia noted. With without closely approximated at the base of the hernia without associated inflammation or obstruction. Musculoskeletal: Mild diffuse anasarca  noted, likely related to overall volume status. Progressive height loss with endplate irregularity within the visualized vertebral bodies of the thoracic spine, suspected to be related to dialysis/renal osteodystrophy given patient history. No definite acute osseous abnormality. Bilateral facet arthrosis noted within the lower lumbar spine. No other worrisome lytic or blastic osseous lesions. IMPRESSION: 1. Mild mucosal enhancement with small volume free pericholecystic fluid and subtle hazy stranding about the gallbladder. Findings may reflect sequelae of acute cholecystitis. Correlation were laboratory values recommended. Additionally, this could be further assessed with dedicated right upper quadrant ultrasound as indicated. 2. Large amount of impacted stool within the rectal vault, similar to previous. 3. Cardiomegaly with bilateral pleural effusions and diffuse anasarca. Associated bibasilar atelectasis within the lung bases. 4. 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 Electronically Signed   By: Rise Mu M.D.   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 female with a history of systolic CHF (reported EF 10-15%) s/p ICD placement, ICM s/p MI with PCI per records, COPD, HTN, CKD stage III, VT on amiodarone, PAF (not on OAC), anemia of Chronic disease, ESRD s/p HD (T,T,S) who presented to [facility] on 02/02/17 with abdominal pain. She was found to have acute cholecystitis and cardiology asked for clearance for cholecystectomy.   Acute cholecystitis: cardiology asked for  pre op clearance. She does not appear to have any active chest pain or CHF. She likely cannot perform  4 METS. She is high risk for surgery given her chronic debilitation and multiple medication problems. Her LEEs Criteria Cardiac Risk Index score is >11% risk of perioperative MI, pulmonary edema, VF, cardiac arrest or CHB. Would recommend drain over surgery if possible.   ESRD on HD (TTS): nephrology consulted for dialysis. Patient appears euvolemic despite missing a HD treatment due to belly pain  Diabetes mellitus type 2: per primary team   HTN: BP currently well controlled. Continue IV BB  PAF: continue amiodarone. Per Dr. Ladona Ridgel, not a good candidate for [facility] despite CHADSVASC of at least 6 (CHF, HTN, age, DM, vasc dz, F sex)  VT: as above, continue amiodarone.   History of CAD s/p stenting:  denies any chest pain. Continue statin and BB. Would add ASA back after surgery  Chronic combined S/D CHF s/p AICD placement: volume management per nephrology. Dr. Ladona Ridgel follows her ICD.   Signed: Cline Crock, PA-C 02/02/2017 1:12 PM  Pager [phone]  Co-Sign MD

## 2017-02-02 NOTE — Consult Note (Signed)
Reason for Consult:Cholecystitis Referring Physician: Domenic Moras, [facility]  Brandy Dudley is an 66 y.o. female.  HPI: Brandy Dudley is a nursing home patient with a history of multiple medical problems including CHF with AICD, COPD, diabetes mellitus, and end-stage renal disease. She has been having episodic right upper quadrant pain over the past month. It's often exacerbated by eating. She also reports that it bothers her during dialysis. She was evaluated in the emergency department this evening. CT scan of the abdomen and pelvis demonstrates inflammatory changes around the gallbladder consistent with cholecystitis. Her alkaline phosphatase is elevated, white blood cell count is mildly elevated. I was asked to see her in consultation.  Past Medical History:  Diagnosis Date  . AICD (automatic cardioverter/defibrillator) present    Biotronik Inventra 7 VR-T DX 04/12/15  . Anemia   . Anxiety   . C. difficile colitis   . Cardiomyopathy ([place])    02/02/16 ([facility]): Mixed ischemic and non-ischemic cardiomyopathy  . CHF (congestive heart failure) (HCC)    systolic  . COPD (chronic obstructive pulmonary disease) ([place])   . Coronary artery disease   . Diabetes mellitus ([place])   . DVT (deep venous thrombosis) (HCC)    on coumadin  . ESRD (end stage renal disease) ([place])    tues/thurs/sat dialysis  . Hypertension   . Neuropathy ([place])   . Obstructive sleep apnea    no cpap  . Renal disorder   . Wears glasses     Past Surgical History:  Procedure Laterality Date  . AV FISTULA PLACEMENT Left 04/03/2016   Procedure: ARTERIOVENOUS (AV) FISTULA CREATION;  Surgeon: Rosetta Posner, MD;  Location: [place];  Service: Vascular;  Laterality: Left;  . AV FISTULA PLACEMENT Right 08/21/2016   Procedure: INSERTION RIGHT ARM ARTERIOVENOUS GRAFT USING 4-7MM X45  CM ACUSEAL GRAFT;  Surgeon: Waynetta Sandy, MD;  Location: [place];  Service: Vascular;  Laterality: Right;  . CORONARY ANGIOPLASTY     LCX stent 2010; by  [facility] 01/2016 note: 03/2014 LHC/RHC: minor luminal irregularities LAD and RCA with patent stent first marginal branch of CX. RA 11, PAP 58/22 with mean 39, mean wedge pressure 19, LVEDP 27, PVR 3.9, CO 5.1, Cardiac index 2.5.   . FISTULA SUPERFICIALIZATION Left 06/16/2016   Procedure: SUPERFICIALIZATION OF LEFT ARM BRACHIOCEPHALIC ARTERIOVENOUS FISTULA;  Surgeon: Angelia Mould, MD;  Location: [place];  Service: Vascular;  Laterality: Left;  . INSERTION OF DIALYSIS CATHETER Left 04/03/2016   Procedure: INSERTION OF DIALYSIS CATHETER;  Surgeon: Rosetta Posner, MD;  Location: [place];  Service: Vascular;  Laterality: Left;  . LIGATION OF COMPETING BRANCHES OF ARTERIOVENOUS FISTULA Left 06/16/2016   Procedure: LIGATION OF COMPETING BRANCHES OF left brachiocephalic ARTERIOVENOUS FISTULA;  Surgeon: Angelia Mould, MD;  Location: [place];  Service: Vascular;  Laterality: Left;  . PERIPHERAL VASCULAR CATHETERIZATION N/A 07/20/2016   Procedure: A/V Shuntogram/Fistulagram;  Surgeon: Conrad [place], MD;  Location: [place] [facility];  Service: Cardiovascular;  Laterality: N/A;  . PERIPHERAL VASCULAR CATHETERIZATION N/A 07/20/2016   Procedure: Dialysis/Perma Catheter Insertion;  Surgeon: Conrad [place], MD;  Location: [place] [facility];  Service: Cardiovascular;  Laterality: N/A;  . REVISION OF ARTERIOVENOUS GORETEX GRAFT Left 06/16/2016   Procedure: REVISION OF LEFT BRACHIOCEPHALIC ARTERIOVENOUS FISTULA WITH RESECTION OF REDUNDANT SECTION;  Surgeon: Angelia Mould, MD;  Location: [place];  Service: Vascular;  Laterality: Left;    No family history on file.  Social History:  reports that  she quit smoking about 10 years ago. Her smoking use included Cigarettes. She has never used smokeless tobacco. She reports that she does not drink alcohol or use drugs.  Allergies:  Allergies  Allergen Reactions  . Entresto [Sacubitril-Valsartan] Other (See Comments)     unknown reaction  . Latex Itching  .  Sulfa Antibiotics Itching    Medications: Prior to Admission:  (Not in a hospital admission)  Results for orders placed or performed during the hospital encounter of 02/01/17 (from the past 48 hour(s))  Comprehensive metabolic panel     Status: Abnormal   Collection Time: 02/01/17  7:24 PM  Result Value Ref Range   Sodium 135 135 - 145 mmol/L   Potassium 3.0 (L) 3.5 - 5.1 mmol/L   Chloride 98 (L) 101 - 111 mmol/L   CO2 28 22 - 32 mmol/L   Glucose, Bld 80 65 - 99 mg/dL   BUN 18 6 - 20 mg/dL   Creatinine, Ser 2.91 (H) 0.44 - 1.00 mg/dL   Calcium 7.8 (L) 8.9 - 10.3 mg/dL   Total Protein 8.3 (H) 6.5 - 8.1 g/dL   Albumin 1.8 (L) 3.5 - 5.0 g/dL   AST 15 15 - 41 U/L   ALT 9 (L) 14 - 54 U/L   Alkaline Phosphatase 199 (H) 38 - 126 U/L   Total Bilirubin 0.6 0.3 - 1.2 mg/dL   GFR calc non Af Amer 16 (L) >60 mL/min   GFR calc Af Amer 18 (L) >60 mL/min    Comment: (NOTE) The eGFR has been calculated using the CKD EPI equation. This calculation has not been validated in all clinical situations. eGFR's persistently <60 mL/min signify possible Chronic Kidney Disease.    Anion gap 9 5 - 15  CBC with Differential/Platelet     Status: Abnormal   Collection Time: 02/01/17  7:24 PM  Result Value Ref Range   WBC 10.7 (H) 4.0 - 10.5 K/uL   RBC 3.71 (L) 3.87 - 5.11 MIL/uL   Hemoglobin 9.2 (L) 12.0 - 15.0 g/dL   HCT 30.3 (L) 36.0 - 46.0 %   MCV 81.7 78.0 - 100.0 fL   MCH 24.8 (L) 26.0 - 34.0 pg   MCHC 30.4 30.0 - 36.0 g/dL   RDW 18.1 (H) 11.5 - 15.5 %   Platelets 287 150 - 400 K/uL   Neutrophils Relative % 81 %   Neutro Abs 8.6 (H) 1.7 - 7.7 K/uL   Lymphocytes Relative 11 %   Lymphs Abs 1.2 0.7 - 4.0 K/uL   Monocytes Relative 6 %   Monocytes Absolute 0.7 0.1 - 1.0 K/uL   Eosinophils Relative 2 %   Eosinophils Absolute 0.2 0.0 - 0.7 K/uL   Basophils Relative 0 %   Basophils Absolute 0.0 0.0 - 0.1 K/uL  Lipase, blood     Status: None   Collection Time: 02/01/17  7:24 PM  Result Value  Ref Range   Lipase 19 11 - 51 U/L    Ct Abdomen Pelvis W Contrast  Result Date: 02/01/2017 CLINICAL DATA:  Initial evaluation for right-sided abdominal pain for 1 month. EXAM: CT ABDOMEN AND PELVIS WITH CONTRAST TECHNIQUE: Multidetector CT imaging of the abdomen and pelvis was performed using the standard protocol following bolus administration of intravenous contrast. CONTRAST:  16m ISOVUE-300 IOPAMIDOL (ISOVUE-300) INJECTION 61% COMPARISON:  Prior CT from 08/26/2016. FINDINGS: Lower chest: Small layering bilateral pleural effusions with associated bibasilar atelectasis. Cardiomegaly with pacemaker electrodes partially visualized. Hepatobiliary: Few scattered subcentimeter hypodensities  noted within the liver, too small the characterize, but may reflect small cyst. Liver otherwise unremarkable. Mucosal edema with mild fuzzy stranding seen about the gallbladder, which may reflect sequelae of acute cholecystitis. Question trace free pericholecystic fluid. No definite internal stones identified. No appreciable biliary dilatation. Pancreas: Pancreas within normal limits. Spleen: Spleen within normal limits. Adrenals/Urinary Tract: Adrenal glands are normal. Kidneys equal in size with symmetric enhancement. Few scattered subcentimeter hypodensities noted within the right kidney, too small the characterize, but statistically likely reflects small cyst. Kidneys otherwise unremarkable without nephrolithiasis, hydronephrosis, or focal enhancing renal mass. No hydroureter. Bladder largely decompressed. Circumferential bladder wall thickening likely related incomplete distension. Stomach/Bowel: Stomach within normal limits. No evidence for bowel obstruction. Appendix well visualized within the right lower quadrant and is within normal limits without associated inflammatory changes to suggest acute appendicitis. No acute inflammatory changes seen about the bowels. Large amount of impacted stool within the rectal vault,  suggesting constipation, similar to previous. Vascular/Lymphatic: Moderate aorto bi-iliac atherosclerotic disease. No aneurysm. No pathologically enlarged intra-abdominal or pelvic lymph nodes. Reproductive: Small amount of fluid present within the endometrial canal. Uterus otherwise unremarkable. Ovaries within normal limits. Other: No free air or fluid. Fat containing paraumbilical hernia noted. With without closely approximated at the base of the hernia without associated inflammation or obstruction. Musculoskeletal: Mild diffuse anasarca noted, likely related to overall volume status. Progressive height loss with endplate irregularity within the visualized vertebral bodies of the thoracic spine, suspected to be related to dialysis/renal osteodystrophy given patient history. No definite acute osseous abnormality. Bilateral facet arthrosis noted within the lower lumbar spine. No other worrisome lytic or blastic osseous lesions. IMPRESSION: 1. Mild mucosal enhancement with small volume free pericholecystic fluid and subtle hazy stranding about the gallbladder. Findings may reflect sequelae of acute cholecystitis. Correlation were laboratory values recommended. Additionally, this could be further assessed with dedicated right upper quadrant ultrasound as indicated. 2. Large amount of impacted stool within the rectal vault, similar to previous. 3. Cardiomegaly with bilateral pleural effusions and diffuse anasarca. Associated bibasilar atelectasis within the lung bases. 4. 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 Electronically Signed   By: Jeannine Boga M.D.   On: 02/01/2017 23:28    Review of Systems  Constitutional: Negative for chills and fever.  HENT: Negative.   Eyes: Negative for blurred vision.  Respiratory: Negative for cough and shortness of breath.    Cardiovascular: Negative for chest pain.  Gastrointestinal: Positive for abdominal pain.  Genitourinary:       ESRD  Musculoskeletal: Negative.   Skin: Negative.   Neurological: Negative.   Endo/Heme/Allergies: Negative.   Psychiatric/Behavioral: Negative.    Blood pressure 128/62, pulse (!) 57, temperature 98.3 F (36.8 C), temperature source Oral, resp. rate 11, height '5\' 4"'  (1.626 m), weight 72.6 kg (160 lb), SpO2 95 %. Physical Exam  Constitutional: She is oriented to person, place, and time. She appears well-developed and well-nourished.  HENT:  Right Ear: External ear normal.  Left Ear: External ear normal.  Nose: Nose normal.  Mouth/Throat: Oropharynx is clear and moist.  Eyes: EOM are normal. Pupils are equal, round, and reactive to light.  Neck: Neck supple. No tracheal deviation present.  Cardiovascular:  Reg with occasional ectopy  Respiratory: Effort normal. No stridor. No respiratory distress. She has no wheezes. She has no rales.  HD catheter R chest  GI: Soft. She exhibits no  distension. There is tenderness. There is guarding. There is no rebound.  Tenderness right upper quadrant with voluntary guarding, no generalized tenderness, no peritonitis, small reducible umbilical hernia  Musculoskeletal:  Mild peripheral edema  Neurological: She is alert and oriented to person, place, and time.  Skin: Skin is warm.  Psychiatric: She has a normal mood and affect.    Assessment/Plan: Cholecystitis in setting of multiple medical problems - agree with medical admission for IV antibiotics and bowel rest. Recommend cardiology evaluation to consider perioperative risks. May need percutaneous cholecystostomy tube versus cholecystectomy. I will discuss further with Dr. Georgette Dover as well.  Claudia Greenley E 02/02/2017, 12:38 AM

## 2017-02-02 NOTE — Progress Notes (Signed)
Subjective: Continues have pain in the right upper quadrant, complaints of being hungry.  Objective: Vital signs in last 24 hours: Temp:  [97.7 F (36.5 C)-98.3 F (36.8 C)] 97.7 F (36.5 C) (03/09 0603) Pulse Rate:  [48-62] 58 (03/09 0603) Resp:  [11-19] 18 (03/09 0603) BP: (95-135)/(58-72) 121/58 (03/09 0603) SpO2:  [91 %-98 %] 95 % (03/09 0855) Weight:  [72.6 kg (160 lb)] 72.6 kg (160 lb) (03/08 1921) Last BM Date: 02/01/17 Nothing by mouth/no IV fluids Afebrile vital signs are stable  No labs this a.m.  CT scan 02/01/17:Mild mucosal enhancement with small volume free pericholecystic fluid and subtle hazy stranding about the gallbladder. Findings may reflect sequelae of acute cholecystitis. Correlation were laboratory values recommended. Additionally, this could be further assessed with dedicated right upper quadrant ultrasound as indicated. 2. Large amount of impacted stool within the rectal vault, similar to previous. 3. Cardiomegaly with bilateral pleural effusions and diffuse anasarca. Associated bibasilar atelectasis within the lung bases. 4. 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   Intake/Output from previous day: 03/08 0701 - 03/09 0700 In: 50 [IV Piggyback:50] Out: -  Intake/Output this shift: No intake/output data recorded.  General appearance: alert, cooperative and no distress GI: Very tender right upper quadrant., Few bowel sounds no peritonitis.  Lab Results:   Recent Labs  02/01/17 1924  WBC 10.7*  HGB 9.2*  HCT 30.3*  PLT 287    BMET  Recent Labs  02/01/17 1924  NA 135  K 3.0*  CL 98*  CO2 28  GLUCOSE 80  BUN 18  CREATININE 2.91*  CALCIUM 7.8*   PT/INR No results for input(s): LABPROT, INR in the last 72 hours.   Recent Labs Lab 02/01/17 1924  AST 15  ALT 9*  ALKPHOS 199*  BILITOT 0.6  PROT  8.3*  ALBUMIN 1.8*     Lipase     Component Value Date/Time   LIPASE 19 02/01/2017 1924     Studies/Results: Ct Abdomen Pelvis W Contrast  Result Date: 02/01/2017 CLINICAL DATA:  Initial evaluation for right-sided abdominal pain for 1 month. EXAM: CT ABDOMEN AND PELVIS WITH CONTRAST TECHNIQUE: Multidetector CT imaging of the abdomen and pelvis was performed using the standard protocol following bolus administration of intravenous contrast. CONTRAST:  ISOVUE-300 IOPAMIDOL (ISOVUE-300) INJECTION 61% COMPARISON:  Prior CT from 08/26/2016. FINDINGS: Lower chest: Small layering bilateral pleural effusions with associated bibasilar atelectasis. Cardiomegaly with pacemaker electrodes partially visualized. Hepatobiliary: Few scattered subcentimeter hypodensities noted within the liver, too small the characterize, but may reflect small cyst. Liver otherwise unremarkable. Mucosal edema with mild fuzzy stranding seen about the gallbladder, which may reflect sequelae of acute cholecystitis. Question trace free pericholecystic fluid. No definite internal stones identified. No appreciable biliary dilatation. Pancreas: Pancreas within normal limits. Spleen: Spleen within normal limits. Adrenals/Urinary Tract: Adrenal glands are normal. Kidneys equal in size with symmetric enhancement. Few scattered subcentimeter hypodensities noted within the right kidney, too small the characterize, but statistically likely reflects small cyst. Kidneys otherwise unremarkable without nephrolithiasis, hydronephrosis, or focal enhancing renal mass. No hydroureter. Bladder largely decompressed. Circumferential bladder wall thickening likely related incomplete distension. Stomach/Bowel: Stomach within normal limits. No evidence for bowel obstruction. Appendix well visualized within the right lower quadrant and is within normal limits without associated inflammatory changes to suggest acute appendicitis. No acute inflammatory changes  seen about the bowels. Large amount of impacted stool within  the rectal vault, suggesting constipation, similar to previous. Vascular/Lymphatic: Moderate aorto bi-iliac atherosclerotic disease. No aneurysm. No pathologically enlarged intra-abdominal or pelvic lymph nodes. Reproductive: Small amount of fluid present within the endometrial canal. Uterus otherwise unremarkable. Ovaries within normal limits. Other: No free air or fluid. Fat containing paraumbilical hernia noted. With without closely approximated at the base of the hernia without associated inflammation or obstruction. Musculoskeletal: Mild diffuse anasarca noted, likely related to overall volume status. Progressive height loss with endplate irregularity within the visualized vertebral bodies of the thoracic spine, suspected to be related to dialysis/renal osteodystrophy given patient history. No definite acute osseous abnormality. Bilateral facet arthrosis noted within the lower lumbar spine. No other worrisome lytic or blastic osseous lesions. IMPRESSION: 1. Mild mucosal enhancement with small volume free pericholecystic fluid and subtle hazy stranding about the gallbladder. Findings may reflect sequelae of acute cholecystitis. Correlation were laboratory values recommended. Additionally, this could be further assessed with dedicated right upper quadrant ultrasound as indicated. 2. Large amount of impacted stool within the rectal vault, similar to previous. 3. Cardiomegaly with bilateral pleural effusions and diffuse anasarca. Associated bibasilar atelectasis within the lung bases. 4. 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 Electronically Signed   By: Rise Mu M.D.   On: 02/01/2017 23:28    Medications: . budesonide  0.25 mg Nebulization BID  . insulin aspart  0-9 Units Subcutaneous Q4H  . metoprolol  2.5  mg Intravenous Q8H  . piperacillin-tazobactam (ZOSYN)  IV  3.375 g Intravenous Q12H     Assessment/Plan Cholecystitis ESRD HD TTS Type 2 diabetes Hypertension CAD with prior stenting Chronic systolic heart failure with AICD placement. Anemia. FEN: Nothing by mouth/no IV fluids ID: Zosyn 02/01/17=>> day 2 DVT: SCDs.  Plan: Awaiting cardiology clearance.      LOS: 0 days    Brandy Dudley 02/02/2017 [phone]

## 2017-02-02 NOTE — Care Management Note (Signed)
Case Management Note  Patient Details  Name: Brandy Dudley MRN: [id number] Date of Birth: 1951-07-20  Subjective/Objective:         Admitted with abd pain, hx heart failure / AICD placement, CAD/ stenting, ESRD on hemodialysis on Tuesdays and Thursdays and Saturday, diabetes mellitus. Pt Is from [facility]. Pt states she is primary wheelchair bound and walks short distances with walker and assistance.      [name] (Sister) [name] (Sister)    [phone]      PCP: [name]  Action/Plan:  General surgery planning cholecystostomy or cholecystectomy, awaiting  Cardiac clearance.  Plan is to d/c back to SNF/ Rehab.when medically stable. CSW to manage disposition back to SNF. CM to follow as needs presents.  Expected Discharge Date:                  Expected Discharge Plan:  Skilled Nursing Facility  In-House Referral:  Clinical Social Work  Discharge planning Services  CM Consult  Post Acute Care Choice:    Choice offered to:     DME Arranged:    DME Agency:     HH Arranged:    HH Agency:     Status of Service:  In process, will continue to follow  If discussed at Long Length of Stay Meetings, dates discussed:    Additional Comments:  Epifanio Lesches, RN 02/02/2017, 11:03 AM

## 2017-02-02 NOTE — Consult Note (Signed)
Chief Complaint: Patient was seen in consultation today for abdominal pain  Referring Physician(s):  Dr. Manus Rudd  Supervising Physician: Malachy Moan  Patient Status: [facility] - In-pt  History of Present Illness: Brandy Dudley is a 66 y.o. female with past medial history of cardiomyopathy, CHF, COPD, DVT not on anticoagulation, CAD, DM2, and ESRD on HD who presented to [facility] ED with complaint of abdominal pain.   CT Abd/Pelvis 02/01/17: 1. Mild mucosal enhancement with small volume free pericholecystic fluid and subtle hazy stranding about the gallbladder. Findings may reflect sequelae of acute cholecystitis. Correlation were laboratory values recommended. Additionally, this could be further assessed with dedicated right upper quadrant ultrasound as indicated. 2. Large amount of impacted stool within the rectal vault, similar to previous. 3. Cardiomegaly with bilateral pleural effusions and diffuse anasarca. Associated bibasilar atelectasis within the lung bases. 4. 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  Patient found to have acute cholecystitis.  Patient was evaluated by cardiology to assess cardiac risk for surgery and was determined to be at high risk. IR consulted for percutaneous cholecystostomy placement at the request of Dr. Harlon Flor.  Case reviewed by Dr. Archer Asa who feels patient is appropriate for procedure.   She has been NPO today.  She does not take blood thinners.   Past Medical History:  Diagnosis Date  . AICD (automatic cardioverter/defibrillator) present    Biotronik Inventra 7 VR-T DX 04/12/15  . Anemia   . Anxiety   . C. difficile colitis   . Cardiomyopathy (HCC)    02/02/16 ([facility]): Mixed ischemic and non-ischemic cardiomyopathy  . CHF (congestive heart failure) (HCC)    systolic  . COPD (chronic obstructive  pulmonary disease) (HCC)   . Coronary artery disease   . Diabetes mellitus (HCC)   . DVT (deep venous thrombosis) (HCC)    on coumadin  . ESRD (end stage renal disease) (HCC)    tues/thurs/sat dialysis  . Hypertension   . Neuropathy (HCC)   . Obstructive sleep apnea    no cpap  . Renal disorder   . Wears glasses     Past Surgical History:  Procedure Laterality Date  . AV FISTULA PLACEMENT Left 04/03/2016   Procedure: ARTERIOVENOUS (AV) FISTULA CREATION;  Surgeon: Larina Earthly, MD;  Location: [facility] OR;  Service: Vascular;  Laterality: Left;  . AV FISTULA PLACEMENT Right 08/21/2016   Procedure: INSERTION RIGHT ARM ARTERIOVENOUS GRAFT USING 4-7MM X45  CM ACUSEAL GRAFT;  Surgeon: Maeola Harman, MD;  Location: [facility] OR;  Service: Vascular;  Laterality: Right;  . CORONARY ANGIOPLASTY     LCX stent 2010; by [facility] 01/2016 note: 03/2014 LHC/RHC: minor luminal irregularities LAD and RCA with patent stent first marginal branch of CX. RA 11, PAP 58/22 with mean 39, mean wedge pressure 19, LVEDP 27, PVR 3.9, CO 5.1, Cardiac index 2.5.   . FISTULA SUPERFICIALIZATION Left 06/16/2016   Procedure: SUPERFICIALIZATION OF LEFT ARM BRACHIOCEPHALIC ARTERIOVENOUS FISTULA;  Surgeon: Chuck Hint, MD;  Location: [facility] OR;  Service: Vascular;  Laterality: Left;  . INSERTION OF DIALYSIS CATHETER Left 04/03/2016   Procedure: INSERTION OF DIALYSIS CATHETER;  Surgeon: Larina Earthly, MD;  Location: [facility] OR;  Service: Vascular;  Laterality: Left;  . LIGATION OF COMPETING BRANCHES OF ARTERIOVENOUS FISTULA Left 06/16/2016   Procedure: LIGATION OF COMPETING BRANCHES OF left brachiocephalic ARTERIOVENOUS FISTULA;  Surgeon: Chuck Hint,  MD;  Location: [facility] OR;  Service: Vascular;  Laterality: Left;  . PERIPHERAL VASCULAR CATHETERIZATION N/A 07/20/2016   Procedure: A/V Shuntogram/Fistulagram;  Surgeon: Fransisco Hertz, MD;  Location: [facility] INVASIVE CV LAB;  Service: Cardiovascular;  Laterality: N/A;  .  PERIPHERAL VASCULAR CATHETERIZATION N/A 07/20/2016   Procedure: Dialysis/Perma Catheter Insertion;  Surgeon: Fransisco Hertz, MD;  Location: [facility] INVASIVE CV LAB;  Service: Cardiovascular;  Laterality: N/A;  . REVISION OF ARTERIOVENOUS GORETEX GRAFT Left 06/16/2016   Procedure: REVISION OF LEFT BRACHIOCEPHALIC ARTERIOVENOUS FISTULA WITH RESECTION OF REDUNDANT SECTION;  Surgeon: Chuck Hint, MD;  Location: [facility];  Service: Vascular;  Laterality: Left;    Allergies: Entresto [sacubitril-valsartan]; Latex; and Sulfa antibiotics  Medications: Prior to Admission medications   Medication Sig Start Date End Date Taking? Authorizing Provider  acetaminophen (TYLENOL) 325 MG tablet Take 650 mg by mouth every 6 (six) hours as needed for mild pain or fever (For fever >99.5).    Yes Historical Provider, MD  albuterol (PROAIR HFA) 108 (90 Base) MCG/ACT inhaler Inhale 2 puffs into the lungs every 6 (six) hours as needed for wheezing or shortness of breath.    Yes Historical Provider, MD  amiodarone (PACERONE) 200 MG tablet Take 1 tablet (200 mg total) by mouth daily. 10/11/16  Yes Marinus Maw, MD  atorvastatin (LIPITOR) 10 MG tablet Take 10 mg by mouth at bedtime.    Yes Historical Provider, MD  famotidine (PEPCID) 20 MG tablet Take 1 tablet (20 mg total) by mouth at bedtime. 07/22/16  Yes Maryann Mikhail, DO  fluticasone (FLOVENT HFA) 110 MCG/ACT inhaler Inhale 2 puffs into the lungs 2 (two) times daily.    Yes Historical Provider, MD  hydrOXYzine (ATARAX/VISTARIL) 50 MG tablet Take 50 mg by mouth every 6 (six) hours as needed for itching.   Yes Historical Provider, MD  insulin glargine (LANTUS) 100 UNIT/ML injection Inject 8 Units into the skin every morning.    Yes Historical Provider, MD  insulin lispro (HUMALOG) 100 UNIT/ML injection Inject 0-10 Units into the skin 3 (three) times daily as needed for high blood sugar (CBG >180). CBG 0-59 Hypoglycemic Protocol, 60-149 0 units 150-250 5 units, 251-300 8  units, 301-350 10 units, >350 call MD   Yes Historical Provider, MD  Inulin (FIBER CHOICE PO) Take 2 tablets by mouth daily as needed (Constipation).    Yes Historical Provider, MD  lactose free nutrition (BOOST PLUS) LIQD Take 237 mLs by mouth daily. At 9PM   Yes Historical Provider, MD  loperamide (IMODIUM) 2 MG capsule Take 1 capsule (2 mg total) by mouth 4 (four) times daily as needed for diarrhea or loose stools. 09/16/16  Yes Jacalyn Lefevre, MD  metoprolol succinate (TOPROL-XL) 25 MG 24 hr tablet Take 12.5 mg by mouth 2 (two) times daily. HOLD for SBP <110 and HP <60   Yes Historical Provider, MD  mirtazapine (REMERON) 15 MG tablet Take 15 mg by mouth at bedtime.   Yes Historical Provider, MD  Multiple Vitamins-Minerals (CERTAVITE/ANTIOXIDANTS) TABS Take 1 tablet by mouth at bedtime.   Yes Historical Provider, MD  nitroGLYCERIN (NITROSTAT) 0.4 MG SL tablet Place 0.4 mg under the tongue every 5 (five) minutes as needed for chest pain.   Yes Historical Provider, MD  oxyCODONE-acetaminophen (PERCOCET/ROXICET) 5-325 MG tablet Take one tablet by mouth every 6 hours as needed for mild pain; Take two tablets by mouth every 6 hours as needed for severe pain. Do not exceed 4gm of Tylenol in 24  hours 10/02/16  Yes Tiffany L Reed, DO  OXYGEN Inhale 2 L into the lungs as needed.   Yes Historical Provider, MD  Propylene Glycol (SYSTANE BALANCE OP) Place 1 drop into both eyes 2 (two) times daily.   Yes Historical Provider, MD  sertraline (ZOLOFT) 25 MG tablet Take 25 mg by mouth daily.    Yes Historical Provider, MD     History reviewed. No pertinent family history.  Social History   Social History  . Marital status: Single    Spouse name: N/A  . Number of children: N/A  . Years of education: N/A   Social History Main Topics  . Smoking status: Former Smoker    Types: Cigarettes    Quit date: 03/03/2006  . Smokeless tobacco: Never Used  . Alcohol use No  . Drug use: No  . Sexual activity: No    Other Topics Concern  . None   Social History Narrative   Admit to [facility] 04/10/16   Never married   Former smoker - 2007   Alcohol none   Full Code    Review of Systems  Constitutional: Negative for fatigue and fever.  Respiratory: Negative for cough and shortness of breath.   Cardiovascular: Negative for chest pain.  Gastrointestinal: Positive for abdominal pain.  Psychiatric/Behavioral: Negative for behavioral problems and confusion.    Vital Signs: BP (!) 121/58 (BP Location: Right Leg)   Pulse (!) 58   Temp 97.7 F (36.5 C) (Oral)   Resp 18   Ht 5\' 4"  (1.626 m)   Wt 160 lb (72.6 kg)   SpO2 95%   BMI 27.46 kg/m   Physical Exam  Constitutional: She is oriented to person, place, and time. She appears well-developed.  Cardiovascular: Normal rate and regular rhythm.   Murmur heard. Pulmonary/Chest: Effort normal and breath sounds normal.  Abdominal: Soft. There is tenderness.  Neurological: She is alert and oriented to person, place, and time.  Skin: Skin is warm and dry.  Psychiatric: She has a normal mood and affect. Her behavior is normal. Judgment and thought content normal.  Nursing note and vitals reviewed.   Mallampati Score:  MD Evaluation Airway: WNL Heart: WNL Abdomen: WNL Chest/ Lungs: WNL ASA  Classification: 3 Mallampati/Airway Score: Two  Imaging: Ct Abdomen Pelvis W Contrast  Result Date: 02/01/2017 CLINICAL DATA:  Initial evaluation for right-sided abdominal pain for 1 month. EXAM: CT ABDOMEN AND PELVIS WITH CONTRAST TECHNIQUE: Multidetector CT imaging of the abdomen and pelvis was performed using the standard protocol following bolus administration of intravenous contrast. CONTRAST:  ISOVUE-300 IOPAMIDOL (ISOVUE-300) INJECTION 61% COMPARISON:  Prior CT from 08/26/2016. FINDINGS: Lower chest: Small layering bilateral pleural effusions with associated bibasilar atelectasis. Cardiomegaly with pacemaker electrodes partially  visualized. Hepatobiliary: Few scattered subcentimeter hypodensities noted within the liver, too small the characterize, but may reflect small cyst. Liver otherwise unremarkable. Mucosal edema with mild fuzzy stranding seen about the gallbladder, which may reflect sequelae of acute cholecystitis. Question trace free pericholecystic fluid. No definite internal stones identified. No appreciable biliary dilatation. Pancreas: Pancreas within normal limits. Spleen: Spleen within normal limits. Adrenals/Urinary Tract: Adrenal glands are normal. Kidneys equal in size with symmetric enhancement. Few scattered subcentimeter hypodensities noted within the right kidney, too small the characterize, but statistically likely reflects small cyst. Kidneys otherwise unremarkable without nephrolithiasis, hydronephrosis, or focal enhancing renal mass. No hydroureter. Bladder largely decompressed. Circumferential bladder wall thickening likely related incomplete distension. Stomach/Bowel: Stomach within normal limits. No evidence for  bowel obstruction. Appendix well visualized within the right lower quadrant and is within normal limits without associated inflammatory changes to suggest acute appendicitis. No acute inflammatory changes seen about the bowels. Large amount of impacted stool within the rectal vault, suggesting constipation, similar to previous. Vascular/Lymphatic: Moderate aorto bi-iliac atherosclerotic disease. No aneurysm. No pathologically enlarged intra-abdominal or pelvic lymph nodes. Reproductive: Small amount of fluid present within the endometrial canal. Uterus otherwise unremarkable. Ovaries within normal limits. Other: No free air or fluid. Fat containing paraumbilical hernia noted. With without closely approximated at the base of the hernia without associated inflammation or obstruction. Musculoskeletal: Mild diffuse anasarca noted, likely related to overall volume status. Progressive height loss with endplate  irregularity within the visualized vertebral bodies of the thoracic spine, suspected to be related to dialysis/renal osteodystrophy given patient history. No definite acute osseous abnormality. Bilateral facet arthrosis noted within the lower lumbar spine. No other worrisome lytic or blastic osseous lesions. IMPRESSION: 1. Mild mucosal enhancement with small volume free pericholecystic fluid and subtle hazy stranding about the gallbladder. Findings may reflect sequelae of acute cholecystitis. Correlation were laboratory values recommended. Additionally, this could be further assessed with dedicated right upper quadrant ultrasound as indicated. 2. Large amount of impacted stool within the rectal vault, similar to previous. 3. Cardiomegaly with bilateral pleural effusions and diffuse anasarca. Associated bibasilar atelectasis within the lung bases. 4. 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 Electronically Signed   By: Rise Mu M.D.   On: 02/01/2017 23:28    Labs:  CBC:  Recent Labs  09/16/16 1232 09/20/16 09/23/16 1749 09/26/16 11/16/16 11/30/16 02/01/17 1924  WBC 13.3* 11.5 8.8 9.2 8.6  --  10.7*  HGB 10.0* 9.5* 9.9* 9.4*  --  10.3* 9.2*  HCT 32.5* 31* 32.7* 31* 35*  --  30.3*  PLT 342 284 PLATELET CLUMPS NOTED ON SMEAR, COUNT APPEARS ADEQUATE  --   --   --  287    COAGS:  Recent Labs  03/23/16 0413 03/24/16 0415 03/25/16 0500 03/26/16 0400  05/06/16 1240 05/07/16 0528 05/08/16 0436 07/15/16 1331  INR 3.02* 2.08* 2.06* 2.30*  < > 2.66* 2.65* 2.62* 1.85  APTT 83* 47* 45* 46*  --   --   --   --   --   < > = values in this interval not displayed.  BMP:  Recent Labs  08/26/16 2046 09/16/16 1232 09/23/16 1749 11/16/16 02/01/17 1924  NA 137 135 135 139 135  K 3.6 3.5 3.0* 4.1 3.0*  CL 100* 97* 98*  --  98*  CO2 26 27 25   --  28  GLUCOSE  118* 73 89  --  80  BUN 14 47* 13  --  18  CALCIUM 8.1* 8.8* 8.1*  --  7.8*  CREATININE 3.02* 4.90* 2.03*  --  2.91*  GFRNONAA 15* 8* 24*  --  16*  GFRAA 17* 10* 28*  --  18*    LIVER FUNCTION TESTS:  Recent Labs  05/06/16 1240  07/22/16 1109 07/26/16 08/26/16 2046 09/16/16 1232 11/16/16 02/01/17 1924  BILITOT 1.5*  --   --   --  0.7 0.8  --  0.6  AST 18  < >  --  17 25 17   --  15  ALT 11*  < >  --  12 12* 12*  --  9*  ALKPHOS 180*  < >  --  179* 194* 182* 172* 199*  PROT 7.3  --   --   --  8.0 8.7*  --  8.3*  ALBUMIN 2.4*  < > 2.0*  --  1.8* 1.9*  --  1.8*  < > = values in this interval not displayed.  TUMOR MARKERS: No results for input(s): AFPTM, CEA, CA199, CHROMGRNA in the last 8760 hours.  Assessment and Plan: Acute Cholecystitis Patient with extensive medical history including cardiomyopathy, COPD, and ESRD on HD presents with abdominal pain.  Patient is found to have acute cholecystitis.  She evaluated by cardiology for risk perioperative complications and deemed to be high risk for surgical intervention.  IR consulted for percutaneous cholecystostomy drain placement at the request of Dr. Stevan Born.  Dr. Archer Asa reviewed case and determined patient to be appropriate candidate for procedure.  She has been NPO.  She does not take blood thinners.  Anticipate procedure today. Risks and benefits discussed with the patient including bleeding, infection, damage to adjacent structures, bowel perforation/fistula connection, and sepsis. All of the patient's questions were answered, patient is agreeable to proceed. Consent signed and in chart.  Thank you for this interesting consult.  I greatly enjoyed meeting Brandy Dudley and look forward to participating in their care.  A copy of this report was sent to the requesting provider on this date.  Electronically Signed: Hoyt Koch 02/02/2017, 3:42 PM   I spent a total of 40 Minutes    in face to face in  clinical consultation, greater than 50% of which was counseling/coordinating care for acute cholecystitis

## 2017-02-02 NOTE — Progress Notes (Signed)
Unable to administer zosyn due to loss of IV access. IV team paged, pt difficult stick.

## 2017-02-02 NOTE — Progress Notes (Signed)
Pt arrived to [address] 30. Alert and oriented x 4. VS stable no signs o aute distress.  Pt oriented to room and equipments, instructed to use call light for assistance.. Bed alarm in place, pt advised about valuable policy. Will continue to monitor and treat pt.

## 2017-02-02 NOTE — Progress Notes (Signed)
PROGRESS NOTE                                                                                                                                                                                                             Patient Demographics:    Giavanni Zeitlin, is a 66 y.o. female, DOB - [birth year], RUE:[id number]  Admit date - 02/01/2017   Admitting Physician Eduard Clos, MD  Outpatient Primary MD for the patient is [name], MD  LOS - 0  Chief Complaint  Patient presents with  . Flank Pain       Brief Narrative  Dorna Mallet is a 66 y.o. female with history of chronic systolic heart failure status post AICD placement, CAD status post stenting, ESRD on hemodialysis on Tuesdays and Thursdays and Saturday, diabetes mellitus presents to the ER with complaints of persistent abdominal pain mostly postprandial. Denies nausea vomiting or diarrhea. Patient did not go for dialysis on Tuesday because of the pain but did go to the dialysis yesterday   Subjective:    Memory Heinrichs today has, No headache, No chest pain, +ve RUQ abdominal pain - No Nausea, No new weakness tingling or numbness, No Cough - SOB.    Assessment  & Plan :      1. Acute cholecystitis. Currently on Zosyn with bowel rest, surgery following, will be a moderate to high risk for perioperative complications however with acute cholecystitis and if it's will outweigh the risks, continue oral cardiac medications with a sip of water. Cardiology to evaluate as well. She understands risks and benefits and would like to proceed with surgery if needed.  2. CAD. History of combined systolic and diastolic heart failure. EF 35%. Currently compensated. Continue beta blocker, fluid removal through dialysis, continue oral beta blocker and statin with a sip of water. As needed IV Lopressor and monitor. Note she is on amiodarone and has AICD as well.  3. Dyslipidemia. Replaced  on statin.  4. Hypertension. Continue beta blocker.  5. Anemia of chronic disease. Stable, type screen done monitor.  6. ESRD on Tuesday, Thursday and Saturday schedule, renal consulted.  7.Dm2 - low-dose sliding scale as needed, gentle D5W while nothing by mouth.  CBG (last 3)   Recent Labs  02/02/17 0757 02/02/17 0813 02/02/17 1204  GLUCAP 58* 187* 57*  Diet : Diet NPO time specified Except for: Sips with Meds    Family Communication  :  None  Code Status :  Full  Disposition Plan  :  TBD  Consults  :  CCS, Cards, Renal  Procedures  :     CT Abd - Acute cholecystitis.  DVT Prophylaxis  :    Heparin   Lab Results  Component Value Date   PLT 287 02/01/2017    Inpatient Medications  Scheduled Meds: . amiodarone  200 mg Oral Daily  . atorvastatin  10 mg Oral QHS  . budesonide  0.25 mg Nebulization BID  . dextrose      . insulin aspart  0-9 Units Subcutaneous Q8H  . metoprolol succinate  12.5 mg Oral BID  . piperacillin-tazobactam (ZOSYN)  IV  3.375 g Intravenous Q12H  . sertraline  25 mg Oral Daily   Continuous Infusions: . dextrose     PRN Meds:.acetaminophen **OR** acetaminophen, albuterol, metoprolol, morphine injection, nitroGLYCERIN, ondansetron **OR** ondansetron (ZOFRAN) IV  Antibiotics  :    Anti-infectives    Start     Dose/Rate Route Frequency Ordered Stop   02/02/17 0400  piperacillin-tazobactam (ZOSYN) IVPB 3.375 g     3.375 g 12.5 mL/hr over 240 Minutes Intravenous Every 12 hours 02/02/17 0324           Objective:   Vitals:   02/02/17 0145 02/02/17 0216 02/02/17 0603 02/02/17 0855  BP: 101/58 135/69 (!) 121/58   Pulse: (!) 55 60 (!) 58   Resp: 11 18 18    Temp:  97.9 F (36.6 C) 97.7 F (36.5 C)   TempSrc:  Oral Oral   SpO2: 91% 98% 91% 95%  Weight:      Height:        Wt Readings from Last 3 Encounters:  02/01/17 72.6 kg (160 lb)  01/26/17 72.6 kg (160 lb)  01/17/17 74 kg (163 lb 2.3 oz)     Intake/Output  Summary (Last 24 hours) at 02/02/17 1313 Last data filed at 02/02/17 0600  Gross per 24 hour  Intake               50 ml  Output                0 ml  Net               50 ml     Physical Exam  Awake Alert, Oriented X 3, No new F.N deficits, Normal affect Petronila.AT,PERRAL Supple Neck,No JVD, No cervical lymphadenopathy appriciated.  Symmetrical Chest wall movement, Good air movement bilaterally, CTAB RRR,No Gallops,Rubs or new Murmurs, No Parasternal Heave +ve B.Sounds, Abd Soft, +ve RUQ tenderness, No organomegaly appriciated, No rebound - guarding or rigidity. No Cyanosis, Clubbing or edema, No new Rash or bruise       Data Review:    CBC  Recent Labs Lab 02/01/17 1924  WBC 10.7*  HGB 9.2*  HCT 30.3*  PLT 287  MCV 81.7  MCH 24.8*  MCHC 30.4  RDW 18.1*  LYMPHSABS 1.2  MONOABS 0.7  EOSABS 0.2  BASOSABS 0.0    Chemistries   Recent Labs Lab 02/01/17 1924  NA 135  K 3.0*  CL 98*  CO2 28  GLUCOSE 80  BUN 18  CREATININE 2.91*  CALCIUM 7.8*  AST 15  ALT 9*  ALKPHOS 199*  BILITOT 0.6   ------------------------------------------------------------------------------------------------------------------ No results for input(s): CHOL, HDL, LDLCALC, TRIG, CHOLHDL, LDLDIRECT in the last 72  hours.  Lab Results  Component Value Date   HGBA1C 5 12/25/2016   ------------------------------------------------------------------------------------------------------------------ No results for input(s): TSH, T4TOTAL, T3FREE, THYROIDAB in the last 72 hours.  Invalid input(s): FREET3 ------------------------------------------------------------------------------------------------------------------ No results for input(s): VITAMINB12, FOLATE, FERRITIN, TIBC, IRON, RETICCTPCT in the last 72 hours.  Coagulation profile No results for input(s): INR, PROTIME in the last 168 hours.  No results for input(s): DDIMER in the last 72 hours.  Cardiac Enzymes No results for input(s):  CKMB, TROPONINI, MYOGLOBIN in the last 168 hours.  Invalid input(s): CK ------------------------------------------------------------------------------------------------------------------    Component Value Date/Time   BNP 1,715.6 (H) 07/15/2016 1331    Micro Results Recent Results (from the past 240 hour(s))  Surgical pcr screen     Status: None   Collection Time: 02/02/17  2:28 AM  Result Value Ref Range Status   MRSA, PCR NEGATIVE NEGATIVE Final   Staphylococcus aureus NEGATIVE NEGATIVE Final    Comment:        The Xpert SA Assay (FDA approved for NASAL specimens in patients over 40 years of age), is one component of a comprehensive surveillance program.  Test performance has been validated by [facility] for patients greater than or equal to 75 year old. It is not intended to diagnose infection nor to guide or monitor treatment.     Radiology Reports Ct Abdomen Pelvis W Contrast  Result Date: 02/01/2017 CLINICAL DATA:  Initial evaluation for right-sided abdominal pain for 1 month. EXAM: CT ABDOMEN AND PELVIS WITH CONTRAST TECHNIQUE: Multidetector CT imaging of the abdomen and pelvis was performed using the standard protocol following bolus administration of intravenous contrast. CONTRAST:  ISOVUE-300 IOPAMIDOL (ISOVUE-300) INJECTION 61% COMPARISON:  Prior CT from 08/26/2016. FINDINGS: Lower chest: Small layering bilateral pleural effusions with associated bibasilar atelectasis. Cardiomegaly with pacemaker electrodes partially visualized. Hepatobiliary: Few scattered subcentimeter hypodensities noted within the liver, too small the characterize, but may reflect small cyst. Liver otherwise unremarkable. Mucosal edema with mild fuzzy stranding seen about the gallbladder, which may reflect sequelae of acute cholecystitis. Question trace free pericholecystic fluid. No definite internal stones identified. No appreciable biliary dilatation. Pancreas: Pancreas within normal limits.  Spleen: Spleen within normal limits. Adrenals/Urinary Tract: Adrenal glands are normal. Kidneys equal in size with symmetric enhancement. Few scattered subcentimeter hypodensities noted within the right kidney, too small the characterize, but statistically likely reflects small cyst. Kidneys otherwise unremarkable without nephrolithiasis, hydronephrosis, or focal enhancing renal mass. No hydroureter. Bladder largely decompressed. Circumferential bladder wall thickening likely related incomplete distension. Stomach/Bowel: Stomach within normal limits. No evidence for bowel obstruction. Appendix well visualized within the right lower quadrant and is within normal limits without associated inflammatory changes to suggest acute appendicitis. No acute inflammatory changes seen about the bowels. Large amount of impacted stool within the rectal vault, suggesting constipation, similar to previous. Vascular/Lymphatic: Moderate aorto bi-iliac atherosclerotic disease. No aneurysm. No pathologically enlarged intra-abdominal or pelvic lymph nodes. Reproductive: Small amount of fluid present within the endometrial canal. Uterus otherwise unremarkable. Ovaries within normal limits. Other: No free air or fluid. Fat containing paraumbilical hernia noted. With without closely approximated at the base of the hernia without associated inflammation or obstruction. Musculoskeletal: Mild diffuse anasarca noted, likely related to overall volume status. Progressive height loss with endplate irregularity within the visualized vertebral bodies of the thoracic spine, suspected to be related to dialysis/renal osteodystrophy given patient history. No definite acute osseous abnormality. Bilateral facet arthrosis noted within the lower lumbar spine. No other worrisome lytic or blastic osseous  lesions. IMPRESSION: 1. Mild mucosal enhancement with small volume free pericholecystic fluid and subtle hazy stranding about the gallbladder. Findings may  reflect sequelae of acute cholecystitis. Correlation were laboratory values recommended. Additionally, this could be further assessed with dedicated right upper quadrant ultrasound as indicated. 2. Large amount of impacted stool within the rectal vault, similar to previous. 3. Cardiomegaly with bilateral pleural effusions and diffuse anasarca. Associated bibasilar atelectasis within the lung bases. 4. 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 Electronically Signed   By: Rise Mu M.D.   On: 02/01/2017 23:28    Time Spent in minutes  30   SINGH,PRASHANT K M.D on 02/02/2017 at 1:13 PM  Between 7am to 7pm - Pager - [phone]  After 7pm go to [email or web address] - password [facility]  [facility] -  Office  [phone]

## 2017-02-02 NOTE — Clinical Social Work Note (Signed)
Clinical Social Work Assessment  Patient Details  Name: Brandy Dudley MRN: [id number] Date of Birth: 11/16/1951  Date of referral:  02/02/17               Reason for consult:  Discharge Planning                Permission sought to share information with:  Facility Medical sales representative, Family Supports Permission granted to share information::  Yes, Verbal Permission Granted  Name::     Brandy Dudley  Agency::  [facility]  Relationship::  Sister  Contact Information:  [phone]  Housing/Transportation Living arrangements for the past 2 months:  Skilled Nursing Facility Source of Information:  Patient Patient Interpreter Needed:  None Criminal Activity/Legal Involvement Pertinent to Current Situation/Hospitalization:  No - Comment as needed Significant Relationships:  Siblings Lives with:  Facility Resident Do you feel safe going back to the place where you live?  Yes Need for family participation in patient care:  No (Coment)  Care giving concerns:  CSW received consult regarding discharge plan. CSW spoke with patient who was pleasant. She stated she resides at [facility] and would like to return there at discharge. CSW to continue to follow and assist with discharge planning needs.   Social Worker assessment / plan:  CSW spoke with patient concerning returning to [facility].  Employment status:  Retired Health and safety inspector:  Managed Care PT Recommendations:  Not assessed at this time Information / Referral to community resources:  Skilled Nursing Facility  Patient/Family's Response to care:  Patient and her sister, Brandy Dudley, express agreement with plan to return to [place].   Patient/Family's Understanding of and Emotional Response to Diagnosis, Current Treatment, and Prognosis:  Patient/family is realistic regarding therapy needs and expressed being hopeful that her stomach will stop hurting. Patient expressed understanding of CSW role and discharge process. No questions/concerns  about plan or treatment.    Emotional Assessment Appearance:  Appears stated age Attitude/Demeanor/Rapport:  Other (Appropriate) Affect (typically observed):  Accepting, Appropriate Orientation:  Oriented to Place, Oriented to Self, Oriented to Situation Alcohol / Substance use:  Not Applicable Psych involvement (Current and /or in the community):  No (Comment)  Discharge Needs  Concerns to be addressed:  Care Coordination Readmission within the last 30 days:  No Current discharge risk:  None Barriers to Discharge:  Continued Medical Work up   [employer], LCSWA 02/02/2017, 11:37 AM

## 2017-02-02 NOTE — Consult Note (Signed)
[facility] Renal Consultation Note    Indication for Consultation:  Management of ESRD/hemodialysis; anemia, hypertension/volume and secondary hyperparathyroidism  HPI: Brandy Dudley is a 66 y.o. female with ESRD on hemodialysis TThS. PMH includes DM, CHF s/p AICD placement. Brandy Dudley presented to ED yesterday with severe right-sided abdominal pain. She says the pain has been ongoing for several weeks, but was more than she could stand yesterday. Pain is worse after eating and she reports only one episode of vomiting about 2 weeks ago. Denies diarrhea, hematemesis, melena,  cough, SOB, or chest pain.  Abdominal CT showing signs of acute cholecystitis. Lipase, AST, ALT. Bilirubin WNL. Currently being evaluated for cholecystectomy. NPO at present and c/o of being hungry.   Last HD was yesterday 02/01/17. Completed full treatment, but missed prior treatment with abdominal pain. Left at 72.7kg near her EDW of 72.5kg. No issues on dialysis other than abdominal pain.    Past Medical History:  Diagnosis Date  . AICD (automatic cardioverter/defibrillator) present    Biotronik Inventra 7 VR-T DX 04/12/15  . Anemia   . Anxiety   . C. difficile colitis   . Cardiomyopathy (HCC)    02/02/16 ([facility]): Mixed ischemic and non-ischemic cardiomyopathy  . CHF (congestive heart failure) (HCC)    systolic  . COPD (chronic obstructive pulmonary disease) (HCC)   . Coronary artery disease   . Diabetes mellitus (HCC)   . DVT (deep venous thrombosis) (HCC)    on coumadin  . ESRD (end stage renal disease) (HCC)    tues/thurs/sat dialysis  . Hypertension   . Neuropathy (HCC)   . Obstructive sleep apnea    no cpap  . Renal disorder   . Wears glasses    Past Surgical History:  Procedure Laterality Date  . AV FISTULA PLACEMENT Left 04/03/2016   Procedure: ARTERIOVENOUS (AV) FISTULA CREATION;  Surgeon: Larina Earthly, MD;  Location: [facility] OR;  Service: Vascular;  Laterality: Left;  . AV FISTULA  PLACEMENT Right 08/21/2016   Procedure: INSERTION RIGHT ARM ARTERIOVENOUS GRAFT USING 4-7MM X45  CM ACUSEAL GRAFT;  Surgeon: Maeola Harman, MD;  Location: [facility] OR;  Service: Vascular;  Laterality: Right;  . CORONARY ANGIOPLASTY     LCX stent 2010; by [facility] 01/2016 note: 03/2014 LHC/RHC: minor luminal irregularities LAD and RCA with patent stent first marginal branch of CX. RA 11, PAP 58/22 with mean 39, mean wedge pressure 19, LVEDP 27, PVR 3.9, CO 5.1, Cardiac index 2.5.   . FISTULA SUPERFICIALIZATION Left 06/16/2016   Procedure: SUPERFICIALIZATION OF LEFT ARM BRACHIOCEPHALIC ARTERIOVENOUS FISTULA;  Surgeon: Chuck Hint, MD;  Location: [facility] OR;  Service: Vascular;  Laterality: Left;  . INSERTION OF DIALYSIS CATHETER Left 04/03/2016   Procedure: INSERTION OF DIALYSIS CATHETER;  Surgeon: Larina Earthly, MD;  Location: [facility] OR;  Service: Vascular;  Laterality: Left;  . LIGATION OF COMPETING BRANCHES OF ARTERIOVENOUS FISTULA Left 06/16/2016   Procedure: LIGATION OF COMPETING BRANCHES OF left brachiocephalic ARTERIOVENOUS FISTULA;  Surgeon: Chuck Hint, MD;  Location: [facility] OR;  Service: Vascular;  Laterality: Left;  . PERIPHERAL VASCULAR CATHETERIZATION N/A 07/20/2016   Procedure: A/V Shuntogram/Fistulagram;  Surgeon: Fransisco Hertz, MD;  Location: [facility] INVASIVE CV LAB;  Service: Cardiovascular;  Laterality: N/A;  . PERIPHERAL VASCULAR CATHETERIZATION N/A 07/20/2016   Procedure: Dialysis/Perma Catheter Insertion;  Surgeon: Fransisco Hertz, MD;  Location: [facility] INVASIVE CV LAB;  Service: Cardiovascular;  Laterality: N/A;  . REVISION OF ARTERIOVENOUS GORETEX GRAFT Left 06/16/2016  Procedure: REVISION OF LEFT BRACHIOCEPHALIC ARTERIOVENOUS FISTULA WITH RESECTION OF REDUNDANT SECTION;  Surgeon: Chuck Hint, MD;  Location: [facility] OR;  Service: Vascular;  Laterality: Left;   History reviewed. No pertinent family history. Social History:  reports that she quit smoking about 10 years ago.  Her smoking use included Cigarettes. She has never used smokeless tobacco. She reports that she does not drink alcohol or use drugs. Allergies  Allergen Reactions  . Entresto [Sacubitril-Valsartan] Other (See Comments)     unknown reaction  . Latex Itching  . Sulfa Antibiotics Itching   Prior to Admission medications   Medication Sig Start Date End Date Taking? Authorizing Provider  acetaminophen (TYLENOL) 325 MG tablet Take 650 mg by mouth every 6 (six) hours as needed for mild pain or fever (For fever >99.5).    Yes Historical Provider, MD  albuterol (PROAIR HFA) 108 (90 Base) MCG/ACT inhaler Inhale 2 puffs into the lungs every 6 (six) hours as needed for wheezing or shortness of breath.    Yes Historical Provider, MD  amiodarone (PACERONE) 200 MG tablet Take 1 tablet (200 mg total) by mouth daily. 10/11/16  Yes Marinus Maw, MD  atorvastatin (LIPITOR) 10 MG tablet Take 10 mg by mouth at bedtime.    Yes Historical Provider, MD  famotidine (PEPCID) 20 MG tablet Take 1 tablet (20 mg total) by mouth at bedtime. 07/22/16  Yes Maryann Mikhail, DO  fluticasone (FLOVENT HFA) 110 MCG/ACT inhaler Inhale 2 puffs into the lungs 2 (two) times daily.    Yes Historical Provider, MD  hydrOXYzine (ATARAX/VISTARIL) 50 MG tablet Take 50 mg by mouth every 6 (six) hours as needed for itching.   Yes Historical Provider, MD  insulin glargine (LANTUS) 100 UNIT/ML injection Inject 8 Units into the skin every morning.    Yes Historical Provider, MD  insulin lispro (HUMALOG) 100 UNIT/ML injection Inject 0-10 Units into the skin 3 (three) times daily as needed for high blood sugar (CBG >180). CBG 0-59 Hypoglycemic Protocol, 60-149 0 units 150-250 5 units, 251-300 8 units, 301-350 10 units, >350 call MD   Yes Historical Provider, MD  Inulin (FIBER CHOICE PO) Take 2 tablets by mouth daily as needed (Constipation).    Yes Historical Provider, MD  lactose free nutrition (BOOST PLUS) LIQD Take 237 mLs by mouth daily. At 9PM    Yes Historical Provider, MD  loperamide (IMODIUM) 2 MG capsule Take 1 capsule (2 mg total) by mouth 4 (four) times daily as needed for diarrhea or loose stools. 09/16/16  Yes Jacalyn Lefevre, MD  metoprolol succinate (TOPROL-XL) 25 MG 24 hr tablet Take 12.5 mg by mouth 2 (two) times daily. HOLD for SBP <110 and HP <60   Yes Historical Provider, MD  mirtazapine (REMERON) 15 MG tablet Take 15 mg by mouth at bedtime.   Yes Historical Provider, MD  Multiple Vitamins-Minerals (CERTAVITE/ANTIOXIDANTS) TABS Take 1 tablet by mouth at bedtime.   Yes Historical Provider, MD  nitroGLYCERIN (NITROSTAT) 0.4 MG SL tablet Place 0.4 mg under the tongue every 5 (five) minutes as needed for chest pain.   Yes Historical Provider, MD  oxyCODONE-acetaminophen (PERCOCET/ROXICET) 5-325 MG tablet Take one tablet by mouth every 6 hours as needed for mild pain; Take two tablets by mouth every 6 hours as needed for severe pain. Do not exceed 4gm of Tylenol in 24 hours 10/02/16  Yes Tiffany L Reed, DO  OXYGEN Inhale 2 L into the lungs as needed.   Yes Historical Provider, MD  Propylene Glycol (SYSTANE BALANCE OP) Place 1 drop into both eyes 2 (two) times daily.   Yes Historical Provider, MD  sertraline (ZOLOFT) 25 MG tablet Take 25 mg by mouth daily.    Yes Historical Provider, MD   Current Facility-Administered Medications  Medication Dose Route Frequency Provider Last Rate Last Dose  . acetaminophen (TYLENOL) tablet 650 mg  650 mg Oral Q6H PRN Eduard Clos, MD       Or  . acetaminophen (TYLENOL) suppository 650 mg  650 mg Rectal Q6H PRN Eduard Clos, MD      . albuterol (PROVENTIL) (2.5 MG/3ML) 0.083% nebulizer solution 2.5 mg  2.5 mg Inhalation Q6H PRN Eduard Clos, MD      . amiodarone (PACERONE) tablet 200 mg  200 mg Oral Daily Leroy Sea, MD      . atorvastatin (LIPITOR) tablet 10 mg  10 mg Oral QHS Leroy Sea, MD      . budesonide (PULMICORT) nebulizer solution 0.25 mg  0.25 mg  Nebulization BID Eduard Clos, MD   0.25 mg at 02/02/17 0855  . dextrose 5 % solution   Intravenous Continuous Leroy Sea, MD      . dextrose 50 % solution           . heparin injection 5,000 Units  5,000 Units Subcutaneous Q8H Leroy Sea, MD      . insulin aspart (novoLOG) injection 0-9 Units  0-9 Units Subcutaneous Q8H Leroy Sea, MD      . metoprolol (LOPRESSOR) injection 5 mg  5 mg Intravenous Q6H PRN Leroy Sea, MD      . metoprolol succinate (TOPROL-XL) 24 hr tablet 12.5 mg  12.5 mg Oral BID Leroy Sea, MD      . morphine 2 MG/ML injection 2 mg  2 mg Intravenous Q4H PRN Eduard Clos, MD      . nitroGLYCERIN (NITROSTAT) SL tablet 0.4 mg  0.4 mg Sublingual Q5 min PRN Eduard Clos, MD      . ondansetron [facility]) tablet 4 mg  4 mg Oral Q6H PRN Eduard Clos, MD       Or  . ondansetron [facility]) injection 4 mg  4 mg Intravenous Q6H PRN Eduard Clos, MD      . piperacillin-tazobactam (ZOSYN) IVPB 3.375 g  3.375 g Intravenous Q12H Juliette Mangle, RPH   3.375 g at 02/02/17 0501  . sertraline (ZOLOFT) tablet 25 mg  25 mg Oral Daily Leroy Sea, MD       ROS: As per HPI otherwise negative.  Physical Exam: Vitals:   02/02/17 0145 02/02/17 0216 02/02/17 0603 02/02/17 0855  BP: 101/58 135/69 (!) 121/58   Pulse: (!) 55 60 (!) 58   Resp: 11 18 18    Temp:  97.9 F (36.6 C) 97.7 F (36.5 C)   TempSrc:  Oral Oral   SpO2: 91% 98% 91% 95%  Weight:      Height:         General: Frail appearing elderly AAF NAD Head: NCAT sclera not icteric MMM Neck: Supple. No JVD No masses Lungs: CTA bilaterally without wheezes, rales, or rhonchi. Breathing is unlabored. Heart: RRR with S1 S2 Abdomen: soft RUQ, RLQ tender to mild palpation  Lower extremities:without edema or ischemic changes, no open wounds  Neuro: A & O  X 3. Moves all extremities spontaneously. Psych:  Responds to questions appropriately with a normal affect. Dialysis  Access:  RIJ TDC with dressing   Labs: Basic Metabolic Panel:  Recent Labs Lab 02/01/17 1924  NA 135  K 3.0*  CL 98*  CO2 28  GLUCOSE 80  BUN 18  CREATININE 2.91*  CALCIUM 7.8*   Liver Function Tests:  Recent Labs Lab 02/01/17 1924  AST 15  ALT 9*  ALKPHOS 199*  BILITOT 0.6  PROT 8.3*  ALBUMIN 1.8*    Recent Labs Lab 02/01/17 1924  LIPASE 19   No results for input(s): AMMONIA in the last 168 hours. CBC:  Recent Labs Lab 02/01/17 1924  WBC 10.7*  NEUTROABS 8.6*  HGB 9.2*  HCT 30.3*  MCV 81.7  PLT 287   Cardiac Enzymes: No results for input(s): CKTOTAL, CKMB, CKMBINDEX, TROPONINI in the last 168 hours. CBG:  Recent Labs Lab 02/02/17 0555 02/02/17 0757 02/02/17 0813 02/02/17 1204 02/02/17 1324  GLUCAP 146* 58* 187* 57* 79   Iron Studies: No results for input(s): IRON, TIBC, TRANSFERRIN, FERRITIN in the last 72 hours. Studies/Results: Ct Abdomen Pelvis W Contrast  Result Date: 02/01/2017 CLINICAL DATA:  Initial evaluation for right-sided abdominal pain for 1 month. EXAM: CT ABDOMEN AND PELVIS WITH CONTRAST TECHNIQUE: Multidetector CT imaging of the abdomen and pelvis was performed using the standard protocol following bolus administration of intravenous contrast. CONTRAST:  ISOVUE-300 IOPAMIDOL (ISOVUE-300) INJECTION 61% COMPARISON:  Prior CT from 08/26/2016. FINDINGS: Lower chest: Small layering bilateral pleural effusions with associated bibasilar atelectasis. Cardiomegaly with pacemaker electrodes partially visualized. Hepatobiliary: Few scattered subcentimeter hypodensities noted within the liver, too small the characterize, but may reflect small cyst. Liver otherwise unremarkable. Mucosal edema with mild fuzzy stranding seen about the gallbladder, which may reflect sequelae of acute cholecystitis. Question trace free pericholecystic fluid. No definite internal stones identified. No appreciable biliary dilatation. Pancreas: Pancreas within normal  limits. Spleen: Spleen within normal limits. Adrenals/Urinary Tract: Adrenal glands are normal. Kidneys equal in size with symmetric enhancement. Few scattered subcentimeter hypodensities noted within the right kidney, too small the characterize, but statistically likely reflects small cyst. Kidneys otherwise unremarkable without nephrolithiasis, hydronephrosis, or focal enhancing renal mass. No hydroureter. Bladder largely decompressed. Circumferential bladder wall thickening likely related incomplete distension. Stomach/Bowel: Stomach within normal limits. No evidence for bowel obstruction. Appendix well visualized within the right lower quadrant and is within normal limits without associated inflammatory changes to suggest acute appendicitis. No acute inflammatory changes seen about the bowels. Large amount of impacted stool within the rectal vault, suggesting constipation, similar to previous. Vascular/Lymphatic: Moderate aorto bi-iliac atherosclerotic disease. No aneurysm. No pathologically enlarged intra-abdominal or pelvic lymph nodes. Reproductive: Small amount of fluid present within the endometrial canal. Uterus otherwise unremarkable. Ovaries within normal limits. Other: No free air or fluid. Fat containing paraumbilical hernia noted. With without closely approximated at the base of the hernia without associated inflammation or obstruction. Musculoskeletal: Mild diffuse anasarca noted, likely related to overall volume status. Progressive height loss with endplate irregularity within the visualized vertebral bodies of the thoracic spine, suspected to be related to dialysis/renal osteodystrophy given patient history. No definite acute osseous abnormality. Bilateral facet arthrosis noted within the lower lumbar spine. No other worrisome lytic or blastic osseous lesions. IMPRESSION: 1. Mild mucosal enhancement with small volume free pericholecystic fluid and subtle hazy stranding about the gallbladder.  Findings may reflect sequelae of acute cholecystitis. Correlation were laboratory values recommended. Additionally, this could be further assessed with dedicated right upper quadrant ultrasound as indicated. 2. Large amount of impacted stool within the rectal vault, similar  to previous. 3. Cardiomegaly with bilateral pleural effusions and diffuse anasarca. Associated bibasilar atelectasis within the lung bases. 4. 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 Electronically Signed   By: Rise Mu M.D.   On: 02/01/2017 23:28    Dialysis Orders:  [facility]  TThS 4h BFR 400/800 2k/2.25 Ca EDW 72.5 kg  UF Profile 2 Heparin 6000 U IV q HD Mircera 225 mcg IV q 2 weeks (last dose 3/8) OP Labs Hgb 9.3 Tsat 19% P 3.9 PTH 158   Assessment/Plan: 1.  RUQ Abdominal pain - Ab CT  reflecting  sequelae of acute cholecystitis - per primary surgery consulted awaiting cardiac evaluation  2.  ESRD -  TTS - cont on schedule K+ 3.0 Use 4K bath / Planning new thigh graft as OP next week  3.  Hypertension/volume  - BP controlled cont home meds/ no gross volume on exam  4.  Anemia  - Hgb 9.2 ESA dosed 02/01/17 follow  5.  Metabolic bone disease -  No VDRA or binders  Ca/P ok follow renal panel   6.  Nutrition - NPO currently Renal diet/vitamins when diet resumes  7. PAF - on amiodarone not a good candidate for anticoag  8. DM II - per primary   Tomasa Blase PA-C [facility] Pager [phone] 02/02/2017, 2:02 PM   Pt seen, examined and agree w A/P as above. ESRD pt on HD x 1 year, here with acute cholecystitis. Stable volume and lytes.  Plan for HD tomorrow on schedule.  Vinson Moselle MD [employer] pager [phone]   02/02/2017, 3:48 PM

## 2017-02-03 LAB — GLUCOSE, CAPILLARY
GLUCOSE-CAPILLARY: 72 mg/dL (ref 65–99)
GLUCOSE-CAPILLARY: 85 mg/dL (ref 65–99)
GLUCOSE-CAPILLARY: 86 mg/dL (ref 65–99)
Glucose-Capillary: 82 mg/dL (ref 65–99)
Glucose-Capillary: 85 mg/dL (ref 65–99)

## 2017-02-03 LAB — CBC WITH DIFFERENTIAL/PLATELET
BASOS ABS: 0 10*3/uL (ref 0.0–0.1)
BASOS PCT: 0 %
EOS ABS: 0.1 10*3/uL (ref 0.0–0.7)
Eosinophils Relative: 1 %
HCT: 30.1 % — ABNORMAL LOW (ref 36.0–46.0)
Hemoglobin: 9 g/dL — ABNORMAL LOW (ref 12.0–15.0)
LYMPHS PCT: 15 %
Lymphs Abs: 1.4 10*3/uL (ref 0.7–4.0)
MCH: 24.3 pg — ABNORMAL LOW (ref 26.0–34.0)
MCHC: 29.9 g/dL — ABNORMAL LOW (ref 30.0–36.0)
MCV: 81.4 fL (ref 78.0–100.0)
MONO ABS: 0.8 10*3/uL (ref 0.1–1.0)
Monocytes Relative: 8 %
Neutro Abs: 7.4 10*3/uL (ref 1.7–7.7)
Neutrophils Relative %: 76 %
PLATELETS: 338 10*3/uL (ref 150–400)
RBC: 3.7 MIL/uL — AB (ref 3.87–5.11)
RDW: 18.2 % — AB (ref 11.5–15.5)
WBC: 9.7 10*3/uL (ref 4.0–10.5)

## 2017-02-03 LAB — RENAL FUNCTION PANEL
Albumin: 1.7 g/dL — ABNORMAL LOW (ref 3.5–5.0)
Anion gap: 14 (ref 5–15)
BUN: 35 mg/dL — AB (ref 6–20)
CHLORIDE: 97 mmol/L — AB (ref 101–111)
CO2: 27 mmol/L (ref 22–32)
Calcium: 8.3 mg/dL — ABNORMAL LOW (ref 8.9–10.3)
Creatinine, Ser: 4.89 mg/dL — ABNORMAL HIGH (ref 0.44–1.00)
GFR calc Af Amer: 10 mL/min — ABNORMAL LOW (ref >60)
GFR, EST NON AFRICAN AMERICAN: 8 mL/min — AB (ref >60)
Glucose, Bld: 86 mg/dL (ref 65–99)
POTASSIUM: 3.7 mmol/L (ref 3.5–5.1)
Phosphorus: 5.3 mg/dL — ABNORMAL HIGH (ref 2.5–4.6)
Sodium: 138 mmol/L (ref 135–145)

## 2017-02-03 MED ORDER — HYDROCODONE-ACETAMINOPHEN 5-325 MG PO TABS
1.0000 | ORAL_TABLET | Freq: Four times a day (QID) | ORAL | Status: DC | PRN
  Administered 2017-02-03 – 2017-02-07 (×7): 1 via ORAL
  Filled 2017-02-03 (×7): qty 1

## 2017-02-03 NOTE — Progress Notes (Signed)
0000 cbg 84 and at 0400 cbg 86

## 2017-02-03 NOTE — Progress Notes (Signed)
Subjective: IR drain placed yesterday with significant CAD.  Doing well, just sore after the drain.    Objective: Vital signs in last 24 hours: Temp:  [97.8 F (36.6 C)-98 F (36.7 C)] 97.8 F (36.6 C) (03/10 0443) Pulse Rate:  [54-62] 56 (03/10 0443) Resp:  [13-22] 16 (03/10 0443) BP: (96-134)/(51-69) 96/51 (03/10 0443) SpO2:  [0 %-100 %] 0 % (03/10 0750) Last BM Date: 02/02/17  Intake/Output from previous day: 03/09 0701 - 03/10 0700 In: 0  Out: 350 [Drains:350] Intake/Output this shift: No intake/output data recorded.  General appearance: alert, cooperative and no distress GI: soft, sore, drain is green bile.    Lab Results:   Recent Labs  02/01/17 1924  WBC 10.7*  HGB 9.2*  HCT 30.3*  PLT 287    BMET  Recent Labs  02/01/17 1924  NA 135  K 3.0*  CL 98*  CO2 28  GLUCOSE 80  BUN 18  CREATININE 2.91*  CALCIUM 7.8*   PT/INR  Recent Labs  02/02/17 1558  LABPROT 17.6*  INR 1.43     Recent Labs Lab 02/01/17 1924  AST 15  ALT 9*  ALKPHOS 199*  BILITOT 0.6  PROT 8.3*  ALBUMIN 1.8*     Lipase     Component Value Date/Time   LIPASE 19 02/01/2017 1924     Studies/Results: Ct Abdomen Pelvis W Contrast  Result Date: 02/01/2017 CLINICAL DATA:  Initial evaluation for right-sided abdominal pain for 1 month. EXAM: CT ABDOMEN AND PELVIS WITH CONTRAST TECHNIQUE: Multidetector CT imaging of the abdomen and pelvis was performed using the standard protocol following bolus administration of intravenous contrast. CONTRAST:  ISOVUE-300 IOPAMIDOL (ISOVUE-300) INJECTION 61% COMPARISON:  Prior CT from 08/26/2016. FINDINGS: Lower chest: Small layering bilateral pleural effusions with associated bibasilar atelectasis. Cardiomegaly with pacemaker electrodes partially visualized. Hepatobiliary: Few scattered subcentimeter hypodensities noted within the liver, too small the characterize, but may reflect small cyst. Liver otherwise unremarkable. Mucosal edema  with mild fuzzy stranding seen about the gallbladder, which may reflect sequelae of acute cholecystitis. Question trace free pericholecystic fluid. No definite internal stones identified. No appreciable biliary dilatation. Pancreas: Pancreas within normal limits. Spleen: Spleen within normal limits. Adrenals/Urinary Tract: Adrenal glands are normal. Kidneys equal in size with symmetric enhancement. Few scattered subcentimeter hypodensities noted within the right kidney, too small the characterize, but statistically likely reflects small cyst. Kidneys otherwise unremarkable without nephrolithiasis, hydronephrosis, or focal enhancing renal mass. No hydroureter. Bladder largely decompressed. Circumferential bladder wall thickening likely related incomplete distension. Stomach/Bowel: Stomach within normal limits. No evidence for bowel obstruction. Appendix well visualized within the right lower quadrant and is within normal limits without associated inflammatory changes to suggest acute appendicitis. No acute inflammatory changes seen about the bowels. Large amount of impacted stool within the rectal vault, suggesting constipation, similar to previous. Vascular/Lymphatic: Moderate aorto bi-iliac atherosclerotic disease. No aneurysm. No pathologically enlarged intra-abdominal or pelvic lymph nodes. Reproductive: Small amount of fluid present within the endometrial canal. Uterus otherwise unremarkable. Ovaries within normal limits. Other: No free air or fluid. Fat containing paraumbilical hernia noted. With without closely approximated at the base of the hernia without associated inflammation or obstruction. Musculoskeletal: Mild diffuse anasarca noted, likely related to overall volume status. Progressive height loss with endplate irregularity within the visualized vertebral bodies of the thoracic spine, suspected to be related to dialysis/renal osteodystrophy given patient history. No definite acute osseous abnormality.  Bilateral facet arthrosis noted within the lower lumbar spine. No other worrisome lytic  or blastic osseous lesions. IMPRESSION: 1. Mild mucosal enhancement with small volume free pericholecystic fluid and subtle hazy stranding about the gallbladder. Findings may reflect sequelae of acute cholecystitis. Correlation were laboratory values recommended. Additionally, this could be further assessed with dedicated right upper quadrant ultrasound as indicated. 2. Large amount of impacted stool within the rectal vault, similar to previous. 3. Cardiomegaly with bilateral pleural effusions and diffuse anasarca. Associated bibasilar atelectasis within the lung bases. 4. 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 Electronically Signed   By: Rise Mu M.D.   On: 02/01/2017 23:28   Ir Perc Cholecystostomy  Result Date: 02/02/2017 INDICATION: 66 year old female with numerous medical problems including congestive heart failure, COPD, and end-stage renal disease. She has acute cholecystitis and is not considered a surgical candidate. She presents for placement of a percutaneous cholecystostomy tube. EXAM: CHOLECYSTOSTOMY MEDICATIONS: Patient recently received intravenous Zosyn. No additional antibiotic prophylaxis was administered. ANESTHESIA/SEDATION: Moderate (conscious) sedation was employed during this procedure. A total of Versed 1 mg and Fentanyl 100 mcg was administered intravenously. Moderate Sedation Time: 10 minutes. The patient's level of consciousness and vital signs were monitored continuously by radiology nursing throughout the procedure under my direct supervision. FLUOROSCOPY TIME:  Fluoroscopy Time: 0 minutes 54 seconds (3 mGy). COMPLICATIONS: None immediate. PROCEDURE: Informed written consent was obtained from the patient after a thorough discussion of the procedural risks,  benefits and alternatives. All questions were addressed. Maximal Sterile Barrier Technique was utilized including caps, mask, sterile gowns, sterile gloves, sterile drape, hand hygiene and skin antiseptic. A timeout was performed prior to the initiation of the procedure. The right upper quadrant was interrogated with ultrasound. The distended gallbladder was successfully identified. The colon was also identified and no was made of its location. Local anesthesia was attained by infiltration with 1% lidocaine. A small dermatotomy was made. Under real-time sonographic guidance, the gallbladder lumen was punctured with a 21 gauge micropuncture needle along a short transhepatic course. Care was taken to avoid the nearby hepatic flexure of the colon. There was return of dark brown cloudy bile through the needle hub. A gentle hand injection of contrast material confirmed opacification of the gallbladder lumen. A 0.018 wire was advanced through the needle and coiled in the gallbladder lumen. The needle was exchanged for the Accustick sheath. The micro wire was then exchanged for a short Amplatz wire. The skin tract was then dilated to 10 [place] and a Cook 10.2 [place] all-purpose drainage catheter was advanced over the wire and formed within the gallbladder. A gentle hand injection of contrast material confirms the tube is located within the gallbladder. The cystic duct is occluded. No definite filling defect to suggest acute cholecystitis. The cholecystostomy tube was connected to gravity bag drainage and secured to the skin with 0 Prolene suture. IMPRESSION: 1. Successful placement of a transhepatic percutaneous cholecystostomy tube for acute cholecystitis. No definite gallstones are visualized. PLAN: 1. Maintain drain to gravity drainage and flush daily. 2. Follow-up in interventional radiology drain clinic in 4 weeks. 3. Given the patient's medical history, she is unlikely to ever become a surgical candidate. If her  cystic duct regains patency in the future we may be able to work toward removal of the drainage catheter. Signed, Sterling Big, MD [facility] Electronically Signed   By: Malachy Moan M.D.   On: 02/02/2017 17:41    Medications: .  amiodarone  200 mg Oral Daily  . atorvastatin  10 mg Oral QHS  . budesonide  0.25 mg Nebulization BID  . feeding supplement (PRO-STAT SUGAR FREE 64)  30 mL Oral BID  . heparin subcutaneous  5,000 Units Subcutaneous Q8H  . insulin aspart  0-9 Units Subcutaneous Q8H  . metoprolol succinate  12.5 mg Oral BID  . piperacillin-tazobactam (ZOSYN)  IV  3.375 g Intravenous Q12H  . sertraline  25 mg Oral Daily    Assessment/Plan Cholecystitis ESRD HD TTS Type 2 diabetes Hypertension CAD with prior stenting Chronic systolic heart failure with AICD placement. Anemia FEN: Nothing by mouth/no IV fluids ID: Zosyn 02/01/17=>> day 3 DVT: SCDs.  PLan:  Ongoing medical management and drain.        LOS: 1 day    Harve Spradley 02/03/2017 [phone]

## 2017-02-03 NOTE — Progress Notes (Signed)
  [facility] Progress Note   Subjective: chole tube was placed by IR yest.  Feels a little better today, not as much RUQ pain.   Vitals:   02/02/17 1925 02/02/17 2142 02/03/17 0443 02/03/17 0750  BP:  (!) 115/52 (!) 96/51   Pulse: (!) 57 62 (!) 56   Resp: 17 17 16    Temp:  98 F (36.7 C) 97.8 F (36.6 C)   TempSrc:  Oral Oral   SpO2: 94% 95% 100% (!) 0%  Weight:      Height:        Inpatient medications: . amiodarone  200 mg Oral Daily  . atorvastatin  10 mg Oral QHS  . budesonide  0.25 mg Nebulization BID  . feeding supplement (PRO-STAT SUGAR FREE 64)  30 mL Oral BID  . heparin subcutaneous  5,000 Units Subcutaneous Q8H  . insulin aspart  0-9 Units Subcutaneous Q8H  . metoprolol succinate  12.5 mg Oral BID  . piperacillin-tazobactam (ZOSYN)  IV  3.375 g Intravenous Q12H  . sertraline  25 mg Oral Daily   . dextrose 1 mL (02/02/17 1541)   sodium chloride, sodium chloride, acetaminophen **OR** acetaminophen, albuterol, alteplase, heparin, heparin, metoprolol, morphine injection, nitroGLYCERIN, ondansetron **OR** ondansetron (ZOFRAN) IV  Exam: Pleasant elderly AAF, no distress, WDWN No jvd Chest clear bialt RRR no mrg Abd soft obese ntnd R abd drain with brownish dark liquid Ext no edema NF, Ox 3 R IJ TDC  D[facility]: [facility] 4h  400/800   2/2.25 bath 72.5kg   P2   Hep 6000   R IJ TDC - Mircera 225 ug every 2 wks last 3/8 - labs: Hb 9.3  Tsat 19%   P3.9  PTH 1488      Assessment: 1. Acute cholecysititis sp perc chole tube 3/9 :  Per IR 2. ESRD HD TTS. For HD today.  3. HTN on Toprol-XL at home, only BP med 4. Anemia - as above 5. Volume is at dry wt 6. PAF on amio 7. DM2 per primary  Plan - HD today   Vinson Moselle MD [facility] pager [phone]   02/03/2017, 11:16 AM    Recent Labs Lab 02/01/17 1924  NA 135  K 3.0*  CL 98*  CO2 28  GLUCOSE 80  BUN 18  CREATININE 2.91*  CALCIUM 7.8*    Recent Labs Lab  02/01/17 1924  AST 15  ALT 9*  ALKPHOS 199*  BILITOT 0.6  PROT 8.3*  ALBUMIN 1.8*    Recent Labs Lab 02/01/17 1924  WBC 10.7*  NEUTROABS 8.6*  HGB 9.2*  HCT 30.3*  MCV 81.7  PLT 287   Iron/TIBC/Ferritin/ %Sat    Component Value Date/Time   IRON 31 04/04/2016 0748   TIBC 328 04/04/2016 0748   FERRITIN 181 04/04/2016 0748   IRONPCTSAT 9 (L) 04/04/2016 7290

## 2017-02-03 NOTE — Progress Notes (Signed)
Patient arrived to unit by bed.  Reviewed treatment plan and this RN agrees with plan.  Report received from bedside RN, Nicholaus Bloom.  Consent obtained.  Patient A & O X 4.   Lung sounds diminisehd to ausculation in all fields. Generalized edema. Cardiac:  NSR, BBB.  Removed caps and cleansed RIJ catheter with chlorhedxidine.  Aspirated ports of heparin and flushed them with saline per protocol.  Connected and secured lines, initiated treatment at 1556.  UF Goal of 500 mL and net fluid removal 0 L.  Will continue to monitor.

## 2017-02-03 NOTE — Progress Notes (Signed)
Requested by RN to place PIV.  Pt refuses.  States left arm is sore from being stuck so much- demonstrating difficulty rotating arm out.  Also states that everything she needs can be done or given during dialysis today.  Pt refusing to have PIV started at this time.  Pleasant and cooperative.   Tresa Endo RN notified.

## 2017-02-03 NOTE — Progress Notes (Signed)
Dialysis treatment completed.  500 mL ultrafiltrated.  0 mL net fluid removal.  Patient status unchanged. Lung sounds diminished to ausculation in all fields. Generalized edema. Cardiac: NSR.  Cleansed RIJ catheter with chlorhexidine.  Disconnected lines and flushed ports with saline per protocol.  Ports locked with heparin and capped per protocol.    Report given to bedside, RN [place].

## 2017-02-03 NOTE — Progress Notes (Signed)
PROGRESS NOTE                                                                                                                                                                                                             Patient Demographics:    Brandy Dudley, is a 66 y.o. female, DOB - 11/18/1951, RUE:[id number]  Admit date - 02/01/2017   Admitting Physician Eduard Clos, MD  Outpatient Primary MD for the patient is [name], MD  LOS - 1  Chief Complaint  Patient presents with  . Flank Pain       Brief Narrative  Brandy Dudley is a 66 y.o. female with history of chronic systolic heart failure status post AICD placement, CAD status post stenting, ESRD on hemodialysis on Tuesdays and Thursdays and Saturday, diabetes mellitus presents to the ER with complaints of persistent abdominal pain mostly postprandial. Denies nausea vomiting or diarrhea. Patient did not go for dialysis on Tuesday because of the pain but did go to the dialysis yesterday   Subjective:    Brandy Dudley today has, No headache, No chest pain, mild RUQ abdominal pain - No Nausea, No new weakness tingling or numbness, No Cough - SOB.    Assessment  & Plan :     1. Acute cholecystitis. Currently on Zosyn with bowel rest, surgery following - She underwent gallbladder drain placement by IR per surgery request on 02/02/2017, don't see any culture sent yet, continue empiric antibiotics, clinically stable further management per surgery and IR.  2. CAD. History of combined systolic and diastolic heart failure. EF 35%. Currently compensated. Continue beta blocker, fluid removal through dialysis, continue oral beta blocker and statin . As needed IV Lopressor and monitor. She has AICD cardiology on board.  3. Dyslipidemia. Replaced on statin.  4. Hypertension. Continue beta blocker.  5. Anemia of chronic disease. Stable, type screen done monitor.  6. ESRD on  Tuesday, Thursday and Saturday schedule, renal consulted.  7. HX of paroxysmal atrial fibrillation along with V. tach in the past, has AICD and is on amiodarone as well. Not anticoagulation candidate per her primary cardiologist Dr Reece Agar.Ladona Ridgel. Her [place] vasc 2 score is at least 5.  8. DM 2 - low-dose sliding scale as needed, gentle D5W while nothing by mouth.  CBG (last 3)   Recent Labs  02/02/17  2352 02/03/17 0422 02/03/17 0825  GLUCAP 86 82 72      Diet : Diet regular Room service appropriate? Yes; Fluid consistency: Thin    Family Communication  :  None  Code Status :  Full  Disposition Plan  :  TBD  Consults  :  CCS, Cards, Renal  Procedures  :     CT Abd - Acute cholecystitis.  Gallbladder drain placement by IR on 02/02/2017  DVT Prophylaxis  :    Heparin   Lab Results  Component Value Date   PLT 287 02/01/2017    Inpatient Medications  Scheduled Meds: . amiodarone  200 mg Oral Daily  . atorvastatin  10 mg Oral QHS  . budesonide  0.25 mg Nebulization BID  . feeding supplement (PRO-STAT SUGAR FREE 64)  30 mL Oral BID  . heparin subcutaneous  5,000 Units Subcutaneous Q8H  . insulin aspart  0-9 Units Subcutaneous Q8H  . metoprolol succinate  12.5 mg Oral BID  . piperacillin-tazobactam (ZOSYN)  IV  3.375 g Intravenous Q12H  . sertraline  25 mg Oral Daily   Continuous Infusions: . dextrose 1 mL (02/02/17 1541)   PRN Meds:.sodium chloride, sodium chloride, acetaminophen **OR** acetaminophen, albuterol, alteplase, heparin, heparin, metoprolol, morphine injection, nitroGLYCERIN, ondansetron **OR** ondansetron (ZOFRAN) IV  Antibiotics  :    Anti-infectives    Start     Dose/Rate Route Frequency Ordered Stop   02/02/17 0400  piperacillin-tazobactam (ZOSYN) IVPB 3.375 g     3.375 g 12.5 mL/hr over 240 Minutes Intravenous Every 12 hours 02/02/17 0324           Objective:   Vitals:   02/02/17 1925 02/02/17 2142 02/03/17 0443 02/03/17 0750  BP:  (!)  115/52 (!) 96/51   Pulse: (!) 57 62 (!) 56   Resp: 17 17 16    Temp:  98 F (36.7 C) 97.8 F (36.6 C)   TempSrc:  Oral Oral   SpO2: 94% 95% 100% (!) 0%  Weight:      Height:        Wt Readings from Last 3 Encounters:  02/01/17 72.6 kg (160 lb)  01/26/17 72.6 kg (160 lb)  01/17/17 74 kg (163 lb 2.3 oz)     Intake/Output Summary (Last 24 hours) at 02/03/17 1021 Last data filed at 02/03/17 0450  Gross per 24 hour  Intake                0 ml  Output              350 ml  Net             -350 ml     Physical Exam  Awake Alert, Oriented X 3, No new F.N deficits, Normal affect .AT,PERRAL Supple Neck,No JVD, No cervical lymphadenopathy appriciated.  Symmetrical Chest wall movement, Good air movement bilaterally, CTAB RRR,No Gallops,Rubs or new Murmurs, No Parasternal Heave +ve B.Sounds, Abd Soft, +ve RUQ tenderness, gallbladder drain in place, No organomegaly appriciated, No rebound - guarding or rigidity. No Cyanosis, Clubbing or edema, No new Rash or bruise       Data Review:    CBC  Recent Labs Lab 02/01/17 1924  WBC 10.7*  HGB 9.2*  HCT 30.3*  PLT 287  MCV 81.7  MCH 24.8*  MCHC 30.4  RDW 18.1*  LYMPHSABS 1.2  MONOABS 0.7  EOSABS 0.2  BASOSABS 0.0    Chemistries   Recent Labs Lab 02/01/17 1924  NA 135  K 3.0*  CL 98*  CO2 28  GLUCOSE 80  BUN 18  CREATININE 2.91*  CALCIUM 7.8*  AST 15  ALT 9*  ALKPHOS 199*  BILITOT 0.6   ------------------------------------------------------------------------------------------------------------------ No results for input(s): CHOL, HDL, LDLCALC, TRIG, CHOLHDL, LDLDIRECT in the last 72 hours.  Lab Results  Component Value Date   HGBA1C 5 12/25/2016   ------------------------------------------------------------------------------------------------------------------ No results for input(s): TSH, T4TOTAL, T3FREE, THYROIDAB in the last 72 hours.  Invalid input(s):  FREET3 ------------------------------------------------------------------------------------------------------------------ No results for input(s): VITAMINB12, FOLATE, FERRITIN, TIBC, IRON, RETICCTPCT in the last 72 hours.  Coagulation profile  Recent Labs Lab 02/02/17 1558  INR 1.43    No results for input(s): DDIMER in the last 72 hours.  Cardiac Enzymes No results for input(s): CKMB, TROPONINI, MYOGLOBIN in the last 168 hours.  Invalid input(s): CK ------------------------------------------------------------------------------------------------------------------    Component Value Date/Time   BNP 1,715.6 (H) 07/15/2016 1331    Micro Results Recent Results (from the past 240 hour(s))  Surgical pcr screen     Status: None   Collection Time: 02/02/17  2:28 AM  Result Value Ref Range Status   MRSA, PCR NEGATIVE NEGATIVE Final   Staphylococcus aureus NEGATIVE NEGATIVE Final    Comment:        The Xpert SA Assay (FDA approved for NASAL specimens in patients over 63 years of age), is one component of a comprehensive surveillance program.  Test performance has been validated by [facility] for patients greater than or equal to 17 year old. It is not intended to diagnose infection nor to guide or monitor treatment.     Radiology Reports Ct Abdomen Pelvis W Contrast  Result Date: 02/01/2017 CLINICAL DATA:  Initial evaluation for right-sided abdominal pain for 1 month. EXAM: CT ABDOMEN AND PELVIS WITH CONTRAST TECHNIQUE: Multidetector CT imaging of the abdomen and pelvis was performed using the standard protocol following bolus administration of intravenous contrast. CONTRAST:  ISOVUE-300 IOPAMIDOL (ISOVUE-300) INJECTION 61% COMPARISON:  Prior CT from 08/26/2016. FINDINGS: Lower chest: Small layering bilateral pleural effusions with associated bibasilar atelectasis. Cardiomegaly with pacemaker electrodes partially visualized. Hepatobiliary: Few scattered subcentimeter  hypodensities noted within the liver, too small the characterize, but may reflect small cyst. Liver otherwise unremarkable. Mucosal edema with mild fuzzy stranding seen about the gallbladder, which may reflect sequelae of acute cholecystitis. Question trace free pericholecystic fluid. No definite internal stones identified. No appreciable biliary dilatation. Pancreas: Pancreas within normal limits. Spleen: Spleen within normal limits. Adrenals/Urinary Tract: Adrenal glands are normal. Kidneys equal in size with symmetric enhancement. Few scattered subcentimeter hypodensities noted within the right kidney, too small the characterize, but statistically likely reflects small cyst. Kidneys otherwise unremarkable without nephrolithiasis, hydronephrosis, or focal enhancing renal mass. No hydroureter. Bladder largely decompressed. Circumferential bladder wall thickening likely related incomplete distension. Stomach/Bowel: Stomach within normal limits. No evidence for bowel obstruction. Appendix well visualized within the right lower quadrant and is within normal limits without associated inflammatory changes to suggest acute appendicitis. No acute inflammatory changes seen about the bowels. Large amount of impacted stool within the rectal vault, suggesting constipation, similar to previous. Vascular/Lymphatic: Moderate aorto bi-iliac atherosclerotic disease. No aneurysm. No pathologically enlarged intra-abdominal or pelvic lymph nodes. Reproductive: Small amount of fluid present within the endometrial canal. Uterus otherwise unremarkable. Ovaries within normal limits. Other: No free air or fluid. Fat containing paraumbilical hernia noted. With without closely approximated at the base of the hernia without associated inflammation or obstruction. Musculoskeletal: Mild diffuse anasarca noted, likely related to  overall volume status. Progressive height loss with endplate irregularity within the visualized vertebral bodies of  the thoracic spine, suspected to be related to dialysis/renal osteodystrophy given patient history. No definite acute osseous abnormality. Bilateral facet arthrosis noted within the lower lumbar spine. No other worrisome lytic or blastic osseous lesions. IMPRESSION: 1. Mild mucosal enhancement with small volume free pericholecystic fluid and subtle hazy stranding about the gallbladder. Findings may reflect sequelae of acute cholecystitis. Correlation were laboratory values recommended. Additionally, this could be further assessed with dedicated right upper quadrant ultrasound as indicated. 2. Large amount of impacted stool within the rectal vault, similar to previous. 3. Cardiomegaly with bilateral pleural effusions and diffuse anasarca. Associated bibasilar atelectasis within the lung bases. 4. 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 Electronically Signed   By: Rise Mu M.D.   On: 02/01/2017 23:28   Ir Perc Cholecystostomy  Result Date: 02/02/2017 INDICATION: 66 year old female with numerous medical problems including congestive heart failure, COPD, and end-stage renal disease. She has acute cholecystitis and is not considered a surgical candidate. She presents for placement of a percutaneous cholecystostomy tube. EXAM: CHOLECYSTOSTOMY MEDICATIONS: Patient recently received intravenous Zosyn. No additional antibiotic prophylaxis was administered. ANESTHESIA/SEDATION: Moderate (conscious) sedation was employed during this procedure. A total of Versed 1 mg and Fentanyl 100 mcg was administered intravenously. Moderate Sedation Time: 10 minutes. The patient's level of consciousness and vital signs were monitored continuously by radiology nursing throughout the procedure under my direct supervision. FLUOROSCOPY TIME:  Fluoroscopy Time: 0 minutes 54 seconds (3 mGy).  COMPLICATIONS: None immediate. PROCEDURE: Informed written consent was obtained from the patient after a thorough discussion of the procedural risks, benefits and alternatives. All questions were addressed. Maximal Sterile Barrier Technique was utilized including caps, mask, sterile gowns, sterile gloves, sterile drape, hand hygiene and skin antiseptic. A timeout was performed prior to the initiation of the procedure. The right upper quadrant was interrogated with ultrasound. The distended gallbladder was successfully identified. The colon was also identified and no was made of its location. Local anesthesia was attained by infiltration with 1% lidocaine. A small dermatotomy was made. Under real-time sonographic guidance, the gallbladder lumen was punctured with a 21 gauge micropuncture needle along a short transhepatic course. Care was taken to avoid the nearby hepatic flexure of the colon. There was return of dark brown cloudy bile through the needle hub. A gentle hand injection of contrast material confirmed opacification of the gallbladder lumen. A 0.018 wire was advanced through the needle and coiled in the gallbladder lumen. The needle was exchanged for the Accustick sheath. The micro wire was then exchanged for a short Amplatz wire. The skin tract was then dilated to 10 [place] and a Cook 10.2 [place] all-purpose drainage catheter was advanced over the wire and formed within the gallbladder. A gentle hand injection of contrast material confirms the tube is located within the gallbladder. The cystic duct is occluded. No definite filling defect to suggest acute cholecystitis. The cholecystostomy tube was connected to gravity bag drainage and secured to the skin with 0 Prolene suture. IMPRESSION: 1. Successful placement of a transhepatic percutaneous cholecystostomy tube for acute cholecystitis. No definite gallstones are visualized. PLAN: 1. Maintain drain to gravity drainage and flush daily. 2. Follow-up in  interventional radiology drain clinic in 4 weeks. 3. Given the patient's medical history, she is unlikely to ever become a surgical candidate. If her cystic duct  regains patency in the future we may be able to work toward removal of the drainage catheter. Signed, Sterling Big, MD [facility] Electronically Signed   By: Malachy Moan M.D.   On: 02/02/2017 17:41    Time Spent in minutes  30   SINGH,PRASHANT K M.D on 02/03/2017 at 10:21 AM  Between 7am to 7pm - Pager - [phone]  After 7pm go to [email or web address] - password [facility]  [facility] -  Office  [phone]

## 2017-02-04 LAB — GLUCOSE, CAPILLARY
GLUCOSE-CAPILLARY: 74 mg/dL (ref 65–99)
GLUCOSE-CAPILLARY: 80 mg/dL (ref 65–99)
GLUCOSE-CAPILLARY: 80 mg/dL (ref 65–99)
Glucose-Capillary: 80 mg/dL (ref 65–99)
Glucose-Capillary: 83 mg/dL (ref 65–99)
Glucose-Capillary: 99 mg/dL (ref 65–99)

## 2017-02-04 LAB — CBC
HCT: 31.8 % — ABNORMAL LOW (ref 36.0–46.0)
Hemoglobin: 9.4 g/dL — ABNORMAL LOW (ref 12.0–15.0)
MCH: 24 pg — ABNORMAL LOW (ref 26.0–34.0)
MCHC: 29.6 g/dL — ABNORMAL LOW (ref 30.0–36.0)
MCV: 81.3 fL (ref 78.0–100.0)
PLATELETS: 296 10*3/uL (ref 150–400)
RBC: 3.91 MIL/uL (ref 3.87–5.11)
RDW: 18.2 % — ABNORMAL HIGH (ref 11.5–15.5)
WBC: 8.6 10*3/uL (ref 4.0–10.5)

## 2017-02-04 MED ORDER — HYDROCORTISONE ACETATE 25 MG RE SUPP
25.0000 mg | Freq: Two times a day (BID) | RECTAL | Status: DC
  Administered 2017-02-04 (×2): 25 mg via RECTAL
  Filled 2017-02-04 (×6): qty 1

## 2017-02-04 MED ORDER — DOCUSATE SODIUM 100 MG PO CAPS
200.0000 mg | ORAL_CAPSULE | Freq: Two times a day (BID) | ORAL | Status: DC
  Administered 2017-02-04 – 2017-02-05 (×3): 200 mg via ORAL
  Filled 2017-02-04 (×5): qty 2

## 2017-02-04 NOTE — Progress Notes (Signed)
  [facility] Progress Note   Subjective: stable no c/o  Vitals:   02/03/17 2039 02/03/17 2250 02/04/17 0529 02/04/17 0957  BP:  (!) 114/52 (!) 129/52   Pulse: (!) 58 (!) 58 62   Resp: 18 20 18    Temp:  97.7 F (36.5 C) 97.8 F (36.6 C)   TempSrc:      SpO2: 95% 96% 100% 94%  Weight:      Height:        Inpatient medications: . amiodarone  200 mg Oral Daily  . atorvastatin  10 mg Oral QHS  . budesonide  0.25 mg Nebulization BID  . docusate sodium  200 mg Oral BID  . feeding supplement (PRO-STAT SUGAR FREE 64)  30 mL Oral BID  . heparin subcutaneous  5,000 Units Subcutaneous Q8H  . hydrocortisone  25 mg Rectal BID  . insulin aspart  0-9 Units Subcutaneous Q8H  . metoprolol succinate  12.5 mg Oral BID  . piperacillin-tazobactam (ZOSYN)  IV  3.375 g Intravenous Q12H  . sertraline  25 mg Oral Daily   . dextrose 1 mL (02/02/17 1541)   sodium chloride, sodium chloride, acetaminophen **OR** acetaminophen, albuterol, alteplase, heparin, heparin, HYDROcodone-acetaminophen, metoprolol, morphine injection, nitroGLYCERIN, ondansetron **OR** ondansetron (ZOFRAN) IV  Exam: Pleasant elderly AAF, no distress, WDWN No jvd Chest clear bialt RRR no mrg Abd soft obese ntnd R abd drain with brownish dark liquid Ext no edema NF, Ox 3 R IJ TDC  [facility]  400/800   2/2.25 bath 72.5kg   P2   Hep 6000   R IJ TDC - Mircera 225 ug every 2 wks last 3/8 - labs: Hb 9.3  Tsat 19%   P3.9  PTH 1488      Assessment: 1. Acute cholecysititis sp perc chole tube 3/9 :  Per IR 2. ESRD HD TTS schedule 3. Anemia of CKD - Hb 9's, just got esa on 3/8 at center (max dose) 4. Volume - 3kg under dry wt 5. PAF on amio and metop 6. DM2 per primary 7. Dispo - prob SNF placement per primary notes  Plan - HD TTS   Vinson Moselle MD [facility] pager [phone]   02/04/2017, 10:26 AM    Recent Labs Lab 02/01/17 1924 02/03/17 1600  NA 135 138  K 3.0* 3.7   CL 98* 97*  CO2 28 27  GLUCOSE 80 86  BUN 18 35*  CREATININE 2.91* 4.89*  CALCIUM 7.8* 8.3*  PHOS  --  5.3*    Recent Labs Lab 02/01/17 1924 02/03/17 1600  AST 15  --   ALT 9*  --   ALKPHOS 199*  --   BILITOT 0.6  --   PROT 8.3*  --   ALBUMIN 1.8* 1.7*    Recent Labs Lab 02/01/17 1924 02/03/17 1559 02/04/17 0830  WBC 10.7* 9.7 8.6  NEUTROABS 8.6* 7.4  --   HGB 9.2* 9.0* 9.4*  HCT 30.3* 30.1* 31.8*  MCV 81.7 81.4 81.3  PLT 287 338 296   Iron/TIBC/Ferritin/ %Sat    Component Value Date/Time   IRON 31 04/04/2016 0748   TIBC 328 04/04/2016 0748   FERRITIN 181 04/04/2016 0748   IRONPCTSAT 9 (L) 04/04/2016 7342

## 2017-02-04 NOTE — Progress Notes (Signed)
PROGRESS NOTE                                                                                                                                                                                                             Patient Demographics:    Brandy Dudley, is a 66 y.o. female, DOB - 09/26/1951, RUE:[id number]  Admit date - 02/01/2017   Admitting Physician Eduard Clos, MD  Outpatient Primary MD for the patient is [name], MD  LOS - 2  Chief Complaint  Patient presents with  . Flank Pain       Brief Narrative  Brandy Dudley is a 66 y.o. female with history of chronic systolic heart failure status post AICD placement, CAD status post stenting, ESRD on hemodialysis on Tuesdays and Thursdays and Saturday, diabetes mellitus presents to the ER with complaints of persistent abdominal pain mostly postprandial. Denies nausea vomiting or diarrhea. Patient did not go for dialysis on Tuesday because of the pain but did go to the dialysis yesterday   Subjective:    Brandy Dudley today has, No headache, No chest pain, mild RUQ abdominal pain - No Nausea, No new weakness tingling or numbness, No Cough - SOB.    Assessment  & Plan :     1. Acute cholecystitis.  She underwent gallbladder drain placement by IR per surgery request on 02/02/2017, don't see any cultures from the drain fluid sent yet, continue empiric Zosyn, clinically stable further management per surgery and IR. She had PT. Likely discharge to SNF soon. Surgery was to follow outpatient in 4-5 weeks.  2. CAD. History of combined systolic and diastolic heart failure. EF 35%. Currently compensated. Continue beta blocker, fluid removal through dialysis, continue oral beta blocker and statin . As needed IV Lopressor and monitor. She has AICD, Cardiology on board.  3. Dyslipidemia. Replaced on statin.  4. Hypertension. Continue beta blocker.  5. Anemia of chronic disease.  Stable, type screen done monitor.  6. ESRD on Tuesday, Thursday and Saturday schedule, renal consulted.  7. HX of paroxysmal atrial fibrillation along with V. tach in the past, has AICD and is on amiodarone as well. Not anticoagulation candidate per her primary cardiologist Dr Reece Agar.Ladona Ridgel. Her [place] vasc 2 score is at least 5.  8. Constipation. Placed on bowel regimen.   9. DM 2 - low-dose  sliding scale as needed, gentle D5W while nothing by mouth.  CBG (last 3)   Recent Labs  02/04/17 0018 02/04/17 0625 02/04/17 0825  GLUCAP 99 83 74      Diet : Diet regular Room service appropriate? Yes; Fluid consistency: Thin    Family Communication  :  None  Code Status :  Full  Disposition Plan  :  SNF in 1-2 days  Consults  :  CCS, Cards, Renal  Procedures  :     CT Abd - Acute cholecystitis.  Gallbladder drain placement by IR on 02/02/2017  DVT Prophylaxis  :    Heparin   Lab Results  Component Value Date   PLT 296 02/04/2017    Inpatient Medications  Scheduled Meds: . amiodarone  200 mg Oral Daily  . atorvastatin  10 mg Oral QHS  . budesonide  0.25 mg Nebulization BID  . docusate sodium  200 mg Oral BID  . feeding supplement (PRO-STAT SUGAR FREE 64)  30 mL Oral BID  . heparin subcutaneous  5,000 Units Subcutaneous Q8H  . hydrocortisone  25 mg Rectal BID  . insulin aspart  0-9 Units Subcutaneous Q8H  . metoprolol succinate  12.5 mg Oral BID  . piperacillin-tazobactam (ZOSYN)  IV  3.375 g Intravenous Q12H  . sertraline  25 mg Oral Daily   Continuous Infusions: . dextrose 1 mL (02/02/17 1541)   PRN Meds:.sodium chloride, sodium chloride, acetaminophen **OR** acetaminophen, albuterol, alteplase, heparin, heparin, HYDROcodone-acetaminophen, metoprolol, morphine injection, nitroGLYCERIN, ondansetron **OR** ondansetron (ZOFRAN) IV  Antibiotics  :    Anti-infectives    Start     Dose/Rate Route Frequency Ordered Stop   02/02/17 0400  piperacillin-tazobactam (ZOSYN)  IVPB 3.375 g     3.375 g 12.5 mL/hr over 240 Minutes Intravenous Every 12 hours 02/02/17 0324           Objective:   Vitals:   02/03/17 2039 02/03/17 2250 02/04/17 0529 02/04/17 0957  BP:  (!) 114/52 (!) 129/52   Pulse: (!) 58 (!) 58 62   Resp: 18 20 18    Temp:  97.7 F (36.5 C) 97.8 F (36.6 C)   TempSrc:      SpO2: 95% 96% 100% 94%  Weight:      Height:        Wt Readings from Last 3 Encounters:  02/03/17 69.5 kg (153 lb 3.5 oz)  01/26/17 72.6 kg (160 lb)  01/17/17 74 kg (163 lb 2.3 oz)     Intake/Output Summary (Last 24 hours) at 02/04/17 1006 Last data filed at 02/04/17 0654  Gross per 24 hour  Intake              320 ml  Output              150 ml  Net              170 ml     Physical Exam  Awake Alert, Oriented X 3, No new F.N deficits, Normal affect [place].AT,PERRAL Supple Neck,No JVD, No cervical lymphadenopathy appriciated.  Symmetrical Chest wall movement, Good air movement bilaterally, CTAB RRR,No Gallops,Rubs or new Murmurs, No Parasternal Heave +ve B.Sounds, Abd Soft, +ve RUQ tenderness, gallbladder drain in place, No organomegaly appriciated, No rebound - guarding or rigidity. No Cyanosis, Clubbing or edema, No new Rash or bruise       Data Review:    CBC  Recent Labs Lab 02/01/17 1924 02/03/17 1559 02/04/17 0830  WBC 10.7* 9.7 8.6  HGB 9.2* 9.0* 9.4*  HCT 30.3* 30.1* 31.8*  PLT 287 338 296  MCV 81.7 81.4 81.3  MCH 24.8* 24.3* 24.0*  MCHC 30.4 29.9* 29.6*  RDW 18.1* 18.2* 18.2*  LYMPHSABS 1.2 1.4  --   MONOABS 0.7 0.8  --   EOSABS 0.2 0.1  --   BASOSABS 0.0 0.0  --     Chemistries   Recent Labs Lab 02/01/17 1924 02/03/17 1600  NA 135 138  K 3.0* 3.7  CL 98* 97*  CO2 28 27  GLUCOSE 80 86  BUN 18 35*  CREATININE 2.91* 4.89*  CALCIUM 7.8* 8.3*  AST 15  --   ALT 9*  --   ALKPHOS 199*  --   BILITOT 0.6  --     ------------------------------------------------------------------------------------------------------------------ No results for input(s): CHOL, HDL, LDLCALC, TRIG, CHOLHDL, LDLDIRECT in the last 72 hours.  Lab Results  Component Value Date   HGBA1C 5 12/25/2016   ------------------------------------------------------------------------------------------------------------------ No results for input(s): TSH, T4TOTAL, T3FREE, THYROIDAB in the last 72 hours.  Invalid input(s): FREET3 ------------------------------------------------------------------------------------------------------------------ No results for input(s): VITAMINB12, FOLATE, FERRITIN, TIBC, IRON, RETICCTPCT in the last 72 hours.  Coagulation profile  Recent Labs Lab 02/02/17 1558  INR 1.43    No results for input(s): DDIMER in the last 72 hours.  Cardiac Enzymes No results for input(s): CKMB, TROPONINI, MYOGLOBIN in the last 168 hours.  Invalid input(s): CK ------------------------------------------------------------------------------------------------------------------    Component Value Date/Time   BNP 1,715.6 (H) 07/15/2016 1331    Micro Results Recent Results (from the past 240 hour(s))  Surgical pcr screen     Status: None   Collection Time: 02/02/17  2:28 AM  Result Value Ref Range Status   MRSA, PCR NEGATIVE NEGATIVE Final   Staphylococcus aureus NEGATIVE NEGATIVE Final    Comment:        The Xpert SA Assay (FDA approved for NASAL specimens in patients over 72 years of age), is one component of a comprehensive surveillance program.  Test performance has been validated by [facility] for patients greater than or equal to 39 year old. It is not intended to diagnose infection nor to guide or monitor treatment.     Radiology Reports Ct Abdomen Pelvis W Contrast  Result Date: 02/01/2017 CLINICAL DATA:  Initial evaluation for right-sided abdominal pain for 1 month. EXAM: CT ABDOMEN AND  PELVIS WITH CONTRAST TECHNIQUE: Multidetector CT imaging of the abdomen and pelvis was performed using the standard protocol following bolus administration of intravenous contrast. CONTRAST:  ISOVUE-300 IOPAMIDOL (ISOVUE-300) INJECTION 61% COMPARISON:  Prior CT from 08/26/2016. FINDINGS: Lower chest: Small layering bilateral pleural effusions with associated bibasilar atelectasis. Cardiomegaly with pacemaker electrodes partially visualized. Hepatobiliary: Few scattered subcentimeter hypodensities noted within the liver, too small the characterize, but may reflect small cyst. Liver otherwise unremarkable. Mucosal edema with mild fuzzy stranding seen about the gallbladder, which may reflect sequelae of acute cholecystitis. Question trace free pericholecystic fluid. No definite internal stones identified. No appreciable biliary dilatation. Pancreas: Pancreas within normal limits. Spleen: Spleen within normal limits. Adrenals/Urinary Tract: Adrenal glands are normal. Kidneys equal in size with symmetric enhancement. Few scattered subcentimeter hypodensities noted within the right kidney, too small the characterize, but statistically likely reflects small cyst. Kidneys otherwise unremarkable without nephrolithiasis, hydronephrosis, or focal enhancing renal mass. No hydroureter. Bladder largely decompressed. Circumferential bladder wall thickening likely related incomplete distension. Stomach/Bowel: Stomach within normal limits. No evidence for bowel obstruction. Appendix well visualized within the right lower quadrant and is within  normal limits without associated inflammatory changes to suggest acute appendicitis. No acute inflammatory changes seen about the bowels. Large amount of impacted stool within the rectal vault, suggesting constipation, similar to previous. Vascular/Lymphatic: Moderate aorto bi-iliac atherosclerotic disease. No aneurysm. No pathologically enlarged intra-abdominal or pelvic lymph nodes.  Reproductive: Small amount of fluid present within the endometrial canal. Uterus otherwise unremarkable. Ovaries within normal limits. Other: No free air or fluid. Fat containing paraumbilical hernia noted. With without closely approximated at the base of the hernia without associated inflammation or obstruction. Musculoskeletal: Mild diffuse anasarca noted, likely related to overall volume status. Progressive height loss with endplate irregularity within the visualized vertebral bodies of the thoracic spine, suspected to be related to dialysis/renal osteodystrophy given patient history. No definite acute osseous abnormality. Bilateral facet arthrosis noted within the lower lumbar spine. No other worrisome lytic or blastic osseous lesions. IMPRESSION: 1. Mild mucosal enhancement with small volume free pericholecystic fluid and subtle hazy stranding about the gallbladder. Findings may reflect sequelae of acute cholecystitis. Correlation were laboratory values recommended. Additionally, this could be further assessed with dedicated right upper quadrant ultrasound as indicated. 2. Large amount of impacted stool within the rectal vault, similar to previous. 3. Cardiomegaly with bilateral pleural effusions and diffuse anasarca. Associated bibasilar atelectasis within the lung bases. 4. 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 Electronically Signed   By: Rise Mu M.D.   On: 02/01/2017 23:28   Ir Perc Cholecystostomy  Result Date: 02/02/2017 INDICATION: 66 year old female with numerous medical problems including congestive heart failure, COPD, and end-stage renal disease. She has acute cholecystitis and is not considered a surgical candidate. She presents for placement of a percutaneous cholecystostomy tube. EXAM: CHOLECYSTOSTOMY MEDICATIONS: Patient recently received intravenous  Zosyn. No additional antibiotic prophylaxis was administered. ANESTHESIA/SEDATION: Moderate (conscious) sedation was employed during this procedure. A total of Versed 1 mg and Fentanyl 100 mcg was administered intravenously. Moderate Sedation Time: 10 minutes. The patient's level of consciousness and vital signs were monitored continuously by radiology nursing throughout the procedure under my direct supervision. FLUOROSCOPY TIME:  Fluoroscopy Time: 0 minutes 54 seconds (3 mGy). COMPLICATIONS: None immediate. PROCEDURE: Informed written consent was obtained from the patient after a thorough discussion of the procedural risks, benefits and alternatives. All questions were addressed. Maximal Sterile Barrier Technique was utilized including caps, mask, sterile gowns, sterile gloves, sterile drape, hand hygiene and skin antiseptic. A timeout was performed prior to the initiation of the procedure. The right upper quadrant was interrogated with ultrasound. The distended gallbladder was successfully identified. The colon was also identified and no was made of its location. Local anesthesia was attained by infiltration with 1% lidocaine. A small dermatotomy was made. Under real-time sonographic guidance, the gallbladder lumen was punctured with a 21 gauge micropuncture needle along a short transhepatic course. Care was taken to avoid the nearby hepatic flexure of the colon. There was return of dark brown cloudy bile through the needle hub. A gentle hand injection of contrast material confirmed opacification of the gallbladder lumen. A 0.018 wire was advanced through the needle and coiled in the gallbladder lumen. The needle was exchanged for the Accustick sheath. The micro wire was then exchanged for a short Amplatz wire. The skin tract was then dilated to 10 [place] and a Cook 10.2 [place] all-purpose drainage catheter was advanced over the wire and formed within the gallbladder. A gentle hand injection of  contrast material  confirms the tube is located within the gallbladder. The cystic duct is occluded. No definite filling defect to suggest acute cholecystitis. The cholecystostomy tube was connected to gravity bag drainage and secured to the skin with 0 Prolene suture. IMPRESSION: 1. Successful placement of a transhepatic percutaneous cholecystostomy tube for acute cholecystitis. No definite gallstones are visualized. PLAN: 1. Maintain drain to gravity drainage and flush daily. 2. Follow-up in interventional radiology drain clinic in 4 weeks. 3. Given the patient's medical history, she is unlikely to ever become a surgical candidate. If her cystic duct regains patency in the future we may be able to work toward removal of the drainage catheter. Signed, Sterling Big, MD [facility] Electronically Signed   By: Malachy Moan M.D.   On: 02/02/2017 17:41    Time Spent in minutes  30   Oralia Criger K M.D on 02/04/2017 at 10:06 AM  Between 7am to 7pm - Pager - [phone]  After 7pm go to [email or web address] - password [facility]  [facility] -  Office  [phone]

## 2017-02-04 NOTE — Progress Notes (Signed)
Subjective: Doing well with drain in place. She has a regular breakfast. Drainage from cholecystostomy tube is bilious green.  Objective: Vital signs in last 24 hours: Temp:  [97.7 F (36.5 C)-98 F (36.7 C)] 97.8 F (36.6 C) (03/11 0529) Pulse Rate:  [55-67] 62 (03/11 0529) Resp:  [16-20] 18 (03/11 0529) BP: (101-135)/(52-70) 129/52 (03/11 0529) SpO2:  [95 %-100 %] 100 % (03/11 0529) Weight:  [69.5 kg (153 lb 3.5 oz)] 69.5 kg (153 lb 3.5 oz) (03/10 1956) Last BM Date: 02/03/17  Intake/Output from previous day: 03/10 0701 - 03/11 0700 In: 320 [P.O.:220; IV Piggyback:100] Out: 240 [Drains:240] Intake/Output this shift: No intake/output data recorded.  General appearance: alert, cooperative and no distress GI: Sore in the right upper quadrant around the drain, but otherwise very comfortable.  Lab Results:   Recent Labs  02/03/17 1559 02/04/17 0830  WBC 9.7 8.6  HGB 9.0* 9.4*  HCT 30.1* 31.8*  PLT 338 296    BMET  Recent Labs  02/01/17 1924 02/03/17 1600  NA 135 138  K 3.0* 3.7  CL 98* 97*  CO2 28 27  GLUCOSE 80 86  BUN 18 35*  CREATININE 2.91* 4.89*  CALCIUM 7.8* 8.3*   PT/INR  Recent Labs  02/02/17 1558  LABPROT 17.6*  INR 1.43     Recent Labs Lab 02/01/17 1924 02/03/17 1600  AST 15  --   ALT 9*  --   ALKPHOS 199*  --   BILITOT 0.6  --   PROT 8.3*  --   ALBUMIN 1.8* 1.7*     Lipase     Component Value Date/Time   LIPASE 19 02/01/2017 1924     Studies/Results: Ir Perc Cholecystostomy  Result Date: 02/02/2017 INDICATION: 66 year old female with numerous medical problems including congestive heart failure, COPD, and end-stage renal disease. She has acute cholecystitis and is not considered a surgical candidate. She presents for placement of a percutaneous cholecystostomy tube. EXAM: CHOLECYSTOSTOMY MEDICATIONS: Patient recently received intravenous Zosyn. No additional antibiotic prophylaxis was administered. ANESTHESIA/SEDATION:  Moderate (conscious) sedation was employed during this procedure. A total of Versed 1 mg and Fentanyl 100 mcg was administered intravenously. Moderate Sedation Time: 10 minutes. The patient's level of consciousness and vital signs were monitored continuously by radiology nursing throughout the procedure under my direct supervision. FLUOROSCOPY TIME:  Fluoroscopy Time: 0 minutes 54 seconds (3 mGy). COMPLICATIONS: None immediate. PROCEDURE: Informed written consent was obtained from the patient after a thorough discussion of the procedural risks, benefits and alternatives. All questions were addressed. Maximal Sterile Barrier Technique was utilized including caps, mask, sterile gowns, sterile gloves, sterile drape, hand hygiene and skin antiseptic. A timeout was performed prior to the initiation of the procedure. The right upper quadrant was interrogated with ultrasound. The distended gallbladder was successfully identified. The colon was also identified and no was made of its location. Local anesthesia was attained by infiltration with 1% lidocaine. A small dermatotomy was made. Under real-time sonographic guidance, the gallbladder lumen was punctured with a 21 gauge micropuncture needle along a short transhepatic course. Care was taken to avoid the nearby hepatic flexure of the colon. There was return of dark brown cloudy bile through the needle hub. A gentle hand injection of contrast material confirmed opacification of the gallbladder lumen. A 0.018 wire was advanced through the needle and coiled in the gallbladder lumen. The needle was exchanged for the Accustick sheath. The micro wire was then exchanged for a short Amplatz wire. The skin tract was  then dilated to 10 [place] and a Cook 10.2 [place] all-purpose drainage catheter was advanced over the wire and formed within the gallbladder. A gentle hand injection of contrast material confirms the tube is located within the gallbladder. The cystic duct is occluded.  No definite filling defect to suggest acute cholecystitis. The cholecystostomy tube was connected to gravity bag drainage and secured to the skin with 0 Prolene suture. IMPRESSION: 1. Successful placement of a transhepatic percutaneous cholecystostomy tube for acute cholecystitis. No definite gallstones are visualized. PLAN: 1. Maintain drain to gravity drainage and flush daily. 2. Follow-up in interventional radiology drain clinic in 4 weeks. 3. Given the patient's medical history, she is unlikely to ever become a surgical candidate. If her cystic duct regains patency in the future we may be able to work toward removal of the drainage catheter. Signed, Sterling Big, MD [facility] Electronically Signed   By: Malachy Moan M.D.   On: 02/02/2017 17:41    Medications: . amiodarone  200 mg Oral Daily  . atorvastatin  10 mg Oral QHS  . budesonide  0.25 mg Nebulization BID  . docusate sodium  200 mg Oral BID  . feeding supplement (PRO-STAT SUGAR FREE 64)  30 mL Oral BID  . heparin subcutaneous  5,000 Units Subcutaneous Q8H  . hydrocortisone  25 mg Rectal BID  . insulin aspart  0-9 Units Subcutaneous Q8H  . metoprolol succinate  12.5 mg Oral BID  . piperacillin-tazobactam (ZOSYN)  IV  3.375 g Intravenous Q12H  . sertraline  25 mg Oral Daily    Assessment/Plan Cholecystitis ESRD HD TTS Type 2 diabetes Hypertension CAD with prior stenting Chronic systolic heart failure with AICD placement. Anemia FEN: Nothing by mouth/no IV fluids ID: Zosyn 02/01/17=>>day 2 DVT: SCD/heparin   Plan: Cholecystostomy drain, medical management of her medical issues. We can see her in follow-up, in the office;  in 5- 6 weeks.    LOS: 2 days    Nehemyah Foushee 02/04/2017 [phone]

## 2017-02-05 ENCOUNTER — Encounter (HOSPITAL_COMMUNITY): Payer: Self-pay | Admitting: General Practice

## 2017-02-05 LAB — GLUCOSE, CAPILLARY
GLUCOSE-CAPILLARY: 70 mg/dL (ref 65–99)
GLUCOSE-CAPILLARY: 89 mg/dL (ref 65–99)
GLUCOSE-CAPILLARY: 91 mg/dL (ref 65–99)
Glucose-Capillary: 76 mg/dL (ref 65–99)
Glucose-Capillary: 94 mg/dL (ref 65–99)
Glucose-Capillary: 89 mg/dL (ref 65–99)

## 2017-02-05 MED ORDER — SIMETHICONE 40 MG/0.6ML PO SUSP
80.0000 mg | Freq: Four times a day (QID) | ORAL | Status: DC | PRN
  Administered 2017-02-05: 80 mg via ORAL
  Filled 2017-02-05 (×2): qty 1.2

## 2017-02-05 MED ORDER — AMOXICILLIN-POT CLAVULANATE 500-125 MG PO TABS
1.0000 | ORAL_TABLET | ORAL | Status: DC
  Administered 2017-02-05 – 2017-02-07 (×3): 500 mg via ORAL
  Filled 2017-02-05 (×3): qty 1

## 2017-02-05 NOTE — Progress Notes (Deleted)
Note entered in error

## 2017-02-05 NOTE — Progress Notes (Signed)
Pharmacy Antibiotic Note  Brandy Dudley is a 66 y.o. female admitted on 02/01/2017 with acute cholecystitis.  Patient was initially started on Zosyn.  Pharmacy has been asked to recommend an appropriate dose of Augmentin in anticipation of discharge on oral antibiotics.  Pt has ESRD and dialyzes T, Th, Sat.  Plan: Augmentin 500mg  PO q24h   [employer], Pharm.D., BCPS Clinical Pharmacist Pager [phone] 02/05/2017 10:08 AM

## 2017-02-05 NOTE — Progress Notes (Signed)
Patient ID: Brandy Dudley, female   DOB: Oct 26, 1951, 66 y.o.   MRN: [id number]  [facility] Progress Note    Subjective:   Feels ok but has a little left sided chest wall discomfort, "I think it was the way I slept".   Objective:   BP (!) 124/53 (BP Location: Left Leg)   Pulse (!) 56   Temp 98.3 F (36.8 C)   Resp 16   Ht 5\' 4"  (1.626 m)   Wt 67 kg (147 lb 11.3 oz)   SpO2 95%   BMI 25.35 kg/m   Intake/Output: I/O last 3 completed shifts: In: 640 [P.O.:440; IV Piggyback:200] Out: 650 [Drains:650]   Intake/Output this shift:  No intake/output data recorded. Weight change: -2.5 kg (-5 lb 8.2 oz)  Physical Exam: PBD:HDIXBOE AAF who appears older than stated age CVS:no rub Resp:cta Abd:+BS, soft, tenderness over RUQ, cholecystostomy tube in place Ext:no edema  Labs: BMET  Recent Labs Lab 02/01/17 1924 02/03/17 1600  NA 135 138  K 3.0* 3.7  CL 98* 97*  CO2 28 27  GLUCOSE 80 86  BUN 18 35*  CREATININE 2.91* 4.89*  ALBUMIN 1.8* 1.7*  CALCIUM 7.8* 8.3*  PHOS  --  5.3*   CBC  Recent Labs Lab 02/01/17 1924 02/03/17 1559 02/04/17 0830  WBC 10.7* 9.7 8.6  NEUTROABS 8.6* 7.4  --   HGB 9.2* 9.0* 9.4*  HCT 30.3* 30.1* 31.8*  MCV 81.7 81.4 81.3  PLT 287 338 296    @IMGRELPRIORS @ Medications:    . amiodarone  200 mg Oral Daily  . atorvastatin  10 mg Oral QHS  . budesonide  0.25 mg Nebulization BID  . docusate sodium  200 mg Oral BID  . feeding supplement (PRO-STAT SUGAR FREE 64)  30 mL Oral BID  . heparin subcutaneous  5,000 Units Subcutaneous Q8H  . hydrocortisone  25 mg Rectal BID  . insulin aspart  0-9 Units Subcutaneous Q8H  . metoprolol succinate  12.5 mg Oral BID  . sertraline  25 mg Oral Daily   [facility] 4[facility]  400/800   2/2.25 bath 72.5kg   P2   Hep 6000   R IJ TDC - Mircera 225 ug every 2 wks last 3/8 - labs: Hb 9.3  Tsat 19%   P3.9  PTH 1488  Assessment/ Plan:   1. Acute cholecystitis- s/p cholecystostomy tube 02/02/17  by IR due to poor surgical candidate 2. ESRD continue with HD qTTS.  Will need to be on different antibiotics if needed for prolonged period as we cannot give zosyn at HD center, unless SNF can. 3. 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 Nutrition: renal diet 6. Hypertension:stable 7. Dispo- she lives at [facility] and will requiring ongoing skilled nursing after discharge.  Irena Cords, MD [facility], [facility] Pager [phone] 02/05/2017, 9:37 AM

## 2017-02-05 NOTE — Progress Notes (Signed)
PROGRESS NOTE                                                                                                                                                                                                             Patient Demographics:    Brandy Dudley, is a 66 y.o. female, DOB - 1951/10/29, ZOX:[id number]  Admit date - 02/01/2017   Admitting Physician Eduard Clos, MD  Outpatient Primary MD for the patient is [name], MD  LOS - 3  Chief Complaint  Patient presents with  . Flank Pain       Brief Narrative  Brandy Dudley is a 67 y.o. female with history of chronic systolic heart failure status post AICD placement, CAD status post stenting, ESRD on hemodialysis on Tuesdays and Thursdays and Saturday, diabetes mellitus presents to the ER with complaints of persistent abdominal pain mostly postprandial. Denies nausea vomiting or diarrhea.     Subjective:    Brandy Dudley today has, No headache, No chest pain, improving RUQ abdominal pain - No Nausea, No new weakness tingling or numbness, No Cough - SOB.    Assessment  & Plan :     1. Acute cholecystitis.  She underwent gallbladder drain placement by IR per surgery request on 02/02/2017, don't see any cultures from the drain fluid sent yet, Was on empiric Zosyn will transition to oral Augmentin on 02/05/2017, clinically stable further management per surgery and IR. She had PT. Likely discharge to SNF in am after HD. Surgery was to follow outpatient in 4-5 weeks.  2. CAD. History of combined systolic and diastolic heart failure. EF 35%. Currently compensated. Continue beta blocker, fluid removal through dialysis, continue oral beta blocker and statin . As needed IV Lopressor and monitor. She has AICD, Cardiology on board.  3. Dyslipidemia. Replaced on statin.  4. Hypertension. Continue beta blocker.  5. Anemia of chronic disease. Stable, type screen done  monitor.  6. ESRD on Tuesday, Thursday and Saturday schedule, renal consulted.  7. HX of paroxysmal atrial fibrillation along with V. tach in the past, has AICD and is on amiodarone as well. Not anticoagulation candidate per her primary cardiologist Dr Reece Agar.Ladona Ridgel. Her [place] vasc 2 score is at least 5.  8. Constipation. Placed on bowel regimen.   9. DM 2 - low-dose sliding scale as needed, gentle D5W  while nothing by mouth.  CBG (last 3)   Recent Labs  02/05/17 0012 02/05/17 0540 02/05/17 0832  GLUCAP 89 70 76      Diet : Diet regular Room service appropriate? Yes; Fluid consistency: Thin    Family Communication  :  None  Code Status :  Full  Disposition Plan  :  Likely discharge to SNF in am after HD  Consults  :  CCS, Cards, Renal  Procedures  :     CT Abd - Acute cholecystitis.  Gallbladder drain placement by IR on 02/02/2017  DVT Prophylaxis  :    Heparin   Lab Results  Component Value Date   PLT 296 02/04/2017    Inpatient Medications  Scheduled Meds: . amiodarone  200 mg Oral Daily  . atorvastatin  10 mg Oral QHS  . budesonide  0.25 mg Nebulization BID  . docusate sodium  200 mg Oral BID  . feeding supplement (PRO-STAT SUGAR FREE 64)  30 mL Oral BID  . heparin subcutaneous  5,000 Units Subcutaneous Q8H  . hydrocortisone  25 mg Rectal BID  . insulin aspart  0-9 Units Subcutaneous Q8H  . metoprolol succinate  12.5 mg Oral BID  . piperacillin-tazobactam (ZOSYN)  IV  3.375 g Intravenous Q12H  . sertraline  25 mg Oral Daily   Continuous Infusions:  PRN Meds:.sodium chloride, albuterol, alteplase, heparin, heparin, HYDROcodone-acetaminophen, metoprolol, morphine injection, nitroGLYCERIN, [DISCONTINUED] ondansetron **OR** ondansetron (ZOFRAN) IV  Antibiotics  :    Anti-infectives    Start     Dose/Rate Route Frequency Ordered Stop   02/02/17 0400  piperacillin-tazobactam (ZOSYN) IVPB 3.375 g     3.375 g 12.5 mL/hr over 240 Minutes Intravenous Every 12  hours 02/02/17 0324           Objective:   Vitals:   02/04/17 1359 02/04/17 2143 02/05/17 0532 02/05/17 0839  BP: (!) 121/52 (!) 108/50 (!) 124/53   Pulse: 66 (!) 53 (!) 55 (!) 56  Resp: 18 20 18 16   Temp: 97.8 F (36.6 C)  98.3 F (36.8 C)   TempSrc:      SpO2:  94% 95% 95%  Weight:   67 kg (147 lb 11.3 oz)   Height:        Wt Readings from Last 3 Encounters:  02/05/17 67 kg (147 lb 11.3 oz)  01/26/17 72.6 kg (160 lb)  01/17/17 74 kg (163 lb 2.3 oz)     Intake/Output Summary (Last 24 hours) at 02/05/17 0931 Last data filed at 02/05/17 0602  Gross per 24 hour  Intake              320 ml  Output              500 ml  Net             -180 ml     Physical Exam  Awake Alert, Oriented X 3, No new F.N deficits, Normal affect Fisher.AT,PERRAL Supple Neck,No JVD, No cervical lymphadenopathy appriciated.  Symmetrical Chest wall movement, Good air movement bilaterally, CTAB RRR,No Gallops,Rubs or new Murmurs, No Parasternal Heave +ve B.Sounds, Abd Soft, +ve RUQ tenderness, gallbladder drain in place, No organomegaly appriciated, No rebound - guarding or rigidity. No Cyanosis, Clubbing or edema, No new Rash or bruise       Data Review:    CBC  Recent Labs Lab 02/01/17 1924 02/03/17 1559 02/04/17 0830  WBC 10.7* 9.7 8.6  HGB 9.2* 9.0* 9.4*  HCT  30.3* 30.1* 31.8*  PLT 287 338 296  MCV 81.7 81.4 81.3  MCH 24.8* 24.3* 24.0*  MCHC 30.4 29.9* 29.6*  RDW 18.1* 18.2* 18.2*  LYMPHSABS 1.2 1.4  --   MONOABS 0.7 0.8  --   EOSABS 0.2 0.1  --   BASOSABS 0.0 0.0  --     Chemistries   Recent Labs Lab 02/01/17 1924 02/03/17 1600  NA 135 138  K 3.0* 3.7  CL 98* 97*  CO2 28 27  GLUCOSE 80 86  BUN 18 35*  CREATININE 2.91* 4.89*  CALCIUM 7.8* 8.3*  AST 15  --   ALT 9*  --   ALKPHOS 199*  --   BILITOT 0.6  --    ------------------------------------------------------------------------------------------------------------------ No results for input(s): CHOL, HDL,  LDLCALC, TRIG, CHOLHDL, LDLDIRECT in the last 72 hours.  Lab Results  Component Value Date   HGBA1C 5 12/25/2016   ------------------------------------------------------------------------------------------------------------------ No results for input(s): TSH, T4TOTAL, T3FREE, THYROIDAB in the last 72 hours.  Invalid input(s): FREET3 ------------------------------------------------------------------------------------------------------------------ No results for input(s): VITAMINB12, FOLATE, FERRITIN, TIBC, IRON, RETICCTPCT in the last 72 hours.  Coagulation profile  Recent Labs Lab 02/02/17 1558  INR 1.43    No results for input(s): DDIMER in the last 72 hours.  Cardiac Enzymes No results for input(s): CKMB, TROPONINI, MYOGLOBIN in the last 168 hours.  Invalid input(s): CK ------------------------------------------------------------------------------------------------------------------    Component Value Date/Time   BNP 1,715.6 (H) 07/15/2016 1331    Micro Results Recent Results (from the past 240 hour(s))  Surgical pcr screen     Status: None   Collection Time: 02/02/17  2:28 AM  Result Value Ref Range Status   MRSA, PCR NEGATIVE NEGATIVE Final   Staphylococcus aureus NEGATIVE NEGATIVE Final    Comment:        The Xpert SA Assay (FDA approved for NASAL specimens in patients over 75 years of age), is one component of a comprehensive surveillance program.  Test performance has been validated by [facility] for patients greater than or equal to 17 year old. It is not intended to diagnose infection nor to guide or monitor treatment.     Radiology Reports Ct Abdomen Pelvis W Contrast  Result Date: 02/01/2017 CLINICAL DATA:  Initial evaluation for right-sided abdominal pain for 1 month. EXAM: CT ABDOMEN AND PELVIS WITH CONTRAST TECHNIQUE: Multidetector CT imaging of the abdomen and pelvis was performed using the standard protocol following bolus administration of  intravenous contrast. CONTRAST:  ISOVUE-300 IOPAMIDOL (ISOVUE-300) INJECTION 61% COMPARISON:  Prior CT from 08/26/2016. FINDINGS: Lower chest: Small layering bilateral pleural effusions with associated bibasilar atelectasis. Cardiomegaly with pacemaker electrodes partially visualized. Hepatobiliary: Few scattered subcentimeter hypodensities noted within the liver, too small the characterize, but may reflect small cyst. Liver otherwise unremarkable. Mucosal edema with mild fuzzy stranding seen about the gallbladder, which may reflect sequelae of acute cholecystitis. Question trace free pericholecystic fluid. No definite internal stones identified. No appreciable biliary dilatation. Pancreas: Pancreas within normal limits. Spleen: Spleen within normal limits. Adrenals/Urinary Tract: Adrenal glands are normal. Kidneys equal in size with symmetric enhancement. Few scattered subcentimeter hypodensities noted within the right kidney, too small the characterize, but statistically likely reflects small cyst. Kidneys otherwise unremarkable without nephrolithiasis, hydronephrosis, or focal enhancing renal mass. No hydroureter. Bladder largely decompressed. Circumferential bladder wall thickening likely related incomplete distension. Stomach/Bowel: Stomach within normal limits. No evidence for bowel obstruction. Appendix well visualized within the right lower quadrant and is within normal limits without associated inflammatory changes  to suggest acute appendicitis. No acute inflammatory changes seen about the bowels. Large amount of impacted stool within the rectal vault, suggesting constipation, similar to previous. Vascular/Lymphatic: Moderate aorto bi-iliac atherosclerotic disease. No aneurysm. No pathologically enlarged intra-abdominal or pelvic lymph nodes. Reproductive: Small amount of fluid present within the endometrial canal. Uterus otherwise unremarkable. Ovaries within normal limits. Other: No free air or  fluid. Fat containing paraumbilical hernia noted. With without closely approximated at the base of the hernia without associated inflammation or obstruction. Musculoskeletal: Mild diffuse anasarca noted, likely related to overall volume status. Progressive height loss with endplate irregularity within the visualized vertebral bodies of the thoracic spine, suspected to be related to dialysis/renal osteodystrophy given patient history. No definite acute osseous abnormality. Bilateral facet arthrosis noted within the lower lumbar spine. No other worrisome lytic or blastic osseous lesions. IMPRESSION: 1. Mild mucosal enhancement with small volume free pericholecystic fluid and subtle hazy stranding about the gallbladder. Findings may reflect sequelae of acute cholecystitis. Correlation were laboratory values recommended. Additionally, this could be further assessed with dedicated right upper quadrant ultrasound as indicated. 2. Large amount of impacted stool within the rectal vault, similar to previous. 3. Cardiomegaly with bilateral pleural effusions and diffuse anasarca. Associated bibasilar atelectasis within the lung bases. 4. 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 Electronically Signed   By: Rise Mu M.D.   On: 02/01/2017 23:28   Ir Perc Cholecystostomy  Result Date: 02/02/2017 INDICATION: 66 year old female with numerous medical problems including congestive heart failure, COPD, and end-stage renal disease. She has acute cholecystitis and is not considered a surgical candidate. She presents for placement of a percutaneous cholecystostomy tube. EXAM: CHOLECYSTOSTOMY MEDICATIONS: Patient recently received intravenous Zosyn. No additional antibiotic prophylaxis was administered. ANESTHESIA/SEDATION: Moderate (conscious) sedation was employed during this procedure. A total  of Versed 1 mg and Fentanyl 100 mcg was administered intravenously. Moderate Sedation Time: 10 minutes. The patient's level of consciousness and vital signs were monitored continuously by radiology nursing throughout the procedure under my direct supervision. FLUOROSCOPY TIME:  Fluoroscopy Time: 0 minutes 54 seconds (3 mGy). COMPLICATIONS: None immediate. PROCEDURE: Informed written consent was obtained from the patient after a thorough discussion of the procedural risks, benefits and alternatives. All questions were addressed. Maximal Sterile Barrier Technique was utilized including caps, mask, sterile gowns, sterile gloves, sterile drape, hand hygiene and skin antiseptic. A timeout was performed prior to the initiation of the procedure. The right upper quadrant was interrogated with ultrasound. The distended gallbladder was successfully identified. The colon was also identified and no was made of its location. Local anesthesia was attained by infiltration with 1% lidocaine. A small dermatotomy was made. Under real-time sonographic guidance, the gallbladder lumen was punctured with a 21 gauge micropuncture needle along a short transhepatic course. Care was taken to avoid the nearby hepatic flexure of the colon. There was return of dark brown cloudy bile through the needle hub. A gentle hand injection of contrast material confirmed opacification of the gallbladder lumen. A 0.018 wire was advanced through the needle and coiled in the gallbladder lumen. The needle was exchanged for the Accustick sheath. The micro wire was then exchanged for a short Amplatz wire. The skin tract was then dilated to 10 [place] and a Cook 10.2 [place] all-purpose drainage catheter was advanced over the wire and formed within the gallbladder. A gentle hand injection of contrast material confirms the tube is  located within the gallbladder. The cystic duct is occluded. No definite filling defect to suggest acute cholecystitis. The  cholecystostomy tube was connected to gravity bag drainage and secured to the skin with 0 Prolene suture. IMPRESSION: 1. Successful placement of a transhepatic percutaneous cholecystostomy tube for acute cholecystitis. No definite gallstones are visualized. PLAN: 1. Maintain drain to gravity drainage and flush daily. 2. Follow-up in interventional radiology drain clinic in 4 weeks. 3. Given the patient's medical history, she is unlikely to ever become a surgical candidate. If her cystic duct regains patency in the future we may be able to work toward removal of the drainage catheter. Signed, Sterling Big, MD [facility] Radiology Electronically Signed   By: Malachy Moan M.D.   On: 02/02/2017 17:41    Time Spent in minutes  30   SINGH,PRASHANT K M.D on 02/05/2017 at 9:31 AM  Between 7am to 7pm - Pager - [phone]  After 7pm go to [email or web address] - password [email or web address]  [facility] -  Office  [phone]

## 2017-02-06 LAB — RENAL FUNCTION PANEL
ALBUMIN: 1.7 g/dL — AB (ref 3.5–5.0)
ANION GAP: 9 (ref 5–15)
BUN: 19 mg/dL (ref 6–20)
CHLORIDE: 100 mmol/L — AB (ref 101–111)
CO2: 27 mmol/L (ref 22–32)
Calcium: 8.2 mg/dL — ABNORMAL LOW (ref 8.9–10.3)
Creatinine, Ser: 4.7 mg/dL — ABNORMAL HIGH (ref 0.44–1.00)
GFR calc Af Amer: 10 mL/min — ABNORMAL LOW (ref >60)
GFR calc non Af Amer: 9 mL/min — ABNORMAL LOW (ref >60)
GLUCOSE: 98 mg/dL (ref 65–99)
PHOSPHORUS: 4.9 mg/dL — AB (ref 2.5–4.6)
POTASSIUM: 3.4 mmol/L — AB (ref 3.5–5.1)
Sodium: 136 mmol/L (ref 135–145)

## 2017-02-06 LAB — TYPE AND SCREEN
ABO/RH(D): A POS
Antibody Screen: POSITIVE
DAT, IgG: POSITIVE
UNIT DIVISION: 0
UNIT DIVISION: 0

## 2017-02-06 LAB — BPAM RBC
BLOOD PRODUCT EXPIRATION DATE: 201804042359
Blood Product Expiration Date: 201804042359
UNIT TYPE AND RH: 6200
Unit Type and Rh: 6200

## 2017-02-06 LAB — CBC
HCT: 30.3 % — ABNORMAL LOW (ref 36.0–46.0)
Hemoglobin: 8.9 g/dL — ABNORMAL LOW (ref 12.0–15.0)
MCH: 24 pg — AB (ref 26.0–34.0)
MCHC: 29.4 g/dL — ABNORMAL LOW (ref 30.0–36.0)
MCV: 81.7 fL (ref 78.0–100.0)
PLATELETS: 332 10*3/uL (ref 150–400)
RBC: 3.71 MIL/uL — AB (ref 3.87–5.11)
RDW: 18.1 % — ABNORMAL HIGH (ref 11.5–15.5)
WBC: 8.8 10*3/uL (ref 4.0–10.5)

## 2017-02-06 LAB — GLUCOSE, CAPILLARY
GLUCOSE-CAPILLARY: 93 mg/dL (ref 65–99)
GLUCOSE-CAPILLARY: 86 mg/dL (ref 65–99)
GLUCOSE-CAPILLARY: 104 mg/dL — AB (ref 65–99)
GLUCOSE-CAPILLARY: 135 mg/dL — AB (ref 65–99)
Glucose-Capillary: 126 mg/dL — ABNORMAL HIGH (ref 65–99)

## 2017-02-06 NOTE — Progress Notes (Addendum)
Call placed to PT for patient evaluation, in order for the process of of D/C to begin. PT states they will try their best to get to pt first thing tomorrow. 

## 2017-02-06 NOTE — Discharge Summary (Signed)
Brandy Dudley ZOX:[id number] DOB: 1951/05/26 DOA: 02/01/2017  PCP: [name], MD  Admit date: 02/01/2017  Discharge date: 02/06/2017  Admitted From: SNF  Disposition:  SNF   Recommendations for Outpatient Follow-up:   Follow up with PCP in 1-2 weeks  PCP Please obtain BMP/CBC, 2 view CXR in 1week,  (see Discharge instructions)   PCP Please follow up on the following pending results: None   Home Health: None   Equipment/Devices: None  Consultations: CCS,IR,Renal,Cards Discharge Condition:   Fair CODE STATUS: Full   Diet Recommendation: Renal diet, fluid restriction 1.5 L per day   Chief Complaint  Patient presents with  . Flank Pain     Brief history of present illness from the day of admission and additional interim summary     Brandy Dudley a 66 y.o.femalewith history of chronic systolic heart failure status post AICD placement, CAD status post stenting, ESRD on hemodialysis on Tuesdays and Thursdays and Saturday, diabetes mellitus presents to the ER with complaints of persistent abdominal pain mostly postprandial. Further workup revealed acute cholecystitis, due to her underlying heart condition surgery decided that she will be best served with a gallbladder drain placement and conservative management.                                                                 Hospital Course    1. Acute cholecystitis.  She underwent gallbladder drain placement by IR per surgery request on 02/02/2017, don't see any cultures from the drain fluid sent , Was on empiric Zosyn will transition to oral Augmentin on 02/05/2017, clinically stable Stressed with surgeon Dr Dwain Sarna on 02/05/2017, no further need of antibiotic has antibiotics will be stopped on 02/06/2017, patient stable to be discharged with her right upper  quadrant gallbladder drain to SNF with outpatient follow with IR in 1-2 weeks. Surgery was to follow outpatient in 4-5 weeks. He is clinically better and currently symptom-free.  2. CAD. History of combined systolic and diastolic heart failure. EF 35%. Currently compensated. Continue fluid removal through dialysis, continue oral beta blocker and statin . Outpatient follow-up with primary cardiologist in 1-2 weeks after discharge.  3. Dyslipidemia. Replaced on statin.  4. Hypertension. Continue beta blocker.  5. Anemia of chronic disease. Stable, Monitor intermittently.  6. ESRD on Tuesday, Thursday and Saturday schedule, renal consultedHas been dialyzed per schedule last dialysis on 02/06/2017.  7. HX of paroxysmal atrial fibrillation along with V. tach in the past, has AICD and is on amiodarone as well. Not anticoagulation candidate per her primary cardiologist Dr Reece Agar.Brandy Dudley. Her [place] vasc 2 score is at least 5.  8. Constipation. Resolved after bowel regimen.   9. DM 2 - Continue home regimen, monitor CBGs before every meal at bedtime.   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)   Anemia of chronic renal failure, stage 5 (HCC)   Chronic combined systolic and diastolic CHF (congestive heart failure) (HCC)   Preoperative clearance    Discharge instructions    Discharge Instructions    Discharge instructions    Complete by:  As directed    Follow with Primary MD [name], MD in 7 days   Get CBC, CMP, 2 view Chest X ray checked  by Primary MD or SNF MD in 5-7 days ( we routinely change or add medications that can affect your baseline labs and fluid status, therefore we recommend that you get the mentioned basic workup next visit with your PCP, your PCP may  decide not to get them or add new tests based on their clinical decision)  Activity: As tolerated with Full fall precautions use walker/cane & assistance as needed  Disposition SNF  Diet:   Renal diet, fluid restriction 1.5 L / day  Accuchecks 4 times/day, Once in AM empty stomach and then before each meal. Log in all results and show them to your Prim.MD in 3 days. If any glucose reading is under 80 or above 300 call your Prim MD immidiately. Follow Low glucose instructions for glucose under 80 as instructed.   For Heart failure patients - Check your Weight same time everyday, if you gain over 2 pounds, or you develop in leg swelling, experience more shortness of breath or chest pain, call your Primary MD immediately. Follow Cardiac Low Salt Diet and 1.5 lit/day fluid restriction.  On your next visit with your primary care physician please Get Medicines reviewed and adjusted.  Please request your Prim.MD to go over all Hospital Tests and Procedure/Radiological results at the follow up, please get all Hospital records sent to your Prim MD by signing hospital release before you go home.  If you experience worsening of your admission symptoms, develop shortness of breath, life threatening emergency, suicidal or homicidal thoughts you must seek medical attention immediately by calling 911 or calling your MD immediately  if symptoms less severe.  You Must read complete instructions/literature along with all the possible adverse reactions/side effects for all the Medicines you take and that have been prescribed to you. Take any new Medicines after you have completely understood and accpet all the possible adverse reactions/side effects.   Do not drive, operate heavy machinery, perform activities at heights, swimming or participation in water activities or provide baby sitting services if your were admitted for syncope or siezures until you have seen by Primary MD or a Neurologist and advised to do  so again.  Do not drive when taking Pain medications.    Do not take more than prescribed Pain, Sleep and Anxiety Medications  Special Instructions: If you have smoked or chewed Tobacco  in the last 2 yrs please stop smoking, stop any regular Alcohol  and or any Recreational drug use.  Wear Seat belts while driving.   Please note  You were cared for by a hospitalist during your hospital stay. If you have any questions about your discharge medications or the care you received while you were in the hospital after you are discharged, you can call the unit and asked to speak with the hospitalist on call if the hospitalist that took care of you is not available.  Once you are discharged, your primary care physician will handle any further medical issues. Please note that NO REFILLS for any discharge medications will be authorized once you are discharged, as it is imperative that you return to your primary care physician (or establish a relationship with a primary care physician if you do not have one) for your aftercare needs so that they can reassess your need for medications and monitor your lab values.   Increase activity slowly    Complete by:  As directed       Discharge Medications   Allergies as of 02/06/2017      Reactions   Entresto [sacubitril-valsartan] Other (See Comments)    unknown reaction   Latex Itching   Sulfa Antibiotics Itching      Medication List    STOP taking these medications   oxyCODONE-acetaminophen 5-325 MG tablet Commonly known as:  PERCOCET/ROXICET     TAKE these medications   acetaminophen 325 MG tablet Commonly known as:  TYLENOL Take 650 mg by mouth every 6 (six) hours as needed for mild pain or fever (For fever >99.5).   amiodarone 200 MG tablet Commonly known as:  PACERONE Take 1 tablet (200 mg total) by mouth daily.   atorvastatin 10 MG tablet Commonly known as:  LIPITOR Take 10 mg by mouth at bedtime.   CERTAVITE/ANTIOXIDANTS Tabs Take  1 tablet by mouth at bedtime.   famotidine 20 MG tablet Commonly known as:  PEPCID Take 1 tablet (20 mg total) by mouth at bedtime.   FIBER CHOICE PO Take 2 tablets by mouth daily as needed (Constipation).   fluticasone 110 MCG/ACT inhaler Commonly known as:  FLOVENT HFA Inhale 2 puffs into the lungs 2 (two) times daily.   hydrOXYzine 50 MG tablet Commonly known as:  ATARAX/VISTARIL Take 50 mg by mouth every 6 (six) hours as needed for itching.   insulin glargine 100 UNIT/ML injection Commonly known as:  LANTUS Inject 8 Units into the skin every morning.   insulin lispro 100 UNIT/ML injection Commonly known as:  HUMALOG Inject 0-10 Units into the skin 3 (three) times daily as needed for high blood sugar (CBG >180). CBG 0-59 Hypoglycemic Protocol, 60-149 0 units 150-250 5 units, 251-300 8 units, 301-350 10 units, >350 call MD   lactose free nutrition Liqd Take 237 mLs by mouth daily. At 9PM   loperamide 2 MG capsule Commonly known as:  IMODIUM Take 1 capsule (2 mg total) by mouth 4 (four) times daily as needed for diarrhea or loose stools.   metoprolol succinate 25 MG 24 hr tablet Commonly known as:  TOPROL-XL Take 12.5 mg by mouth 2 (two) times daily. HOLD for SBP <110 and HP <60   mirtazapine 15 MG tablet Commonly known as:  REMERON Take 15 mg by mouth at bedtime.   nitroGLYCERIN 0.4 MG SL tablet Commonly known as:  NITROSTAT Place 0.4 mg under the tongue every 5 (five) minutes as needed for chest pain.   OXYGEN Inhale 2 L into the lungs as needed.   PROAIR HFA 108 (90 Base) MCG/ACT inhaler Generic drug:  albuterol Inhale 2 puffs into the lungs every 6 (six) hours as needed for wheezing or shortness of breath.   sertraline 25 MG tablet Commonly known as:  ZOLOFT Take 25 mg by mouth daily.   SYSTANE BALANCE OP Place 1 drop into both eyes 2 (two) times daily.        Contact information for follow-up providers    [facility]  E, MD. Schedule an  appointment as soon as possible for a visit in 3 week(s).   Specialty:  General Surgery Contact information: [address] [place] [address] [phone]        [name], MD. Schedule an appointment as soon as possible for a visit in 1 week(s).   Specialty:  Internal Medicine Contact information: [address] [place] [address] [phone]        Malachy Moan, MD. Schedule an appointment as soon as possible for a visit in 1 week(s).   Specialties:  Interventional Radiology, Radiology Why:  drin management Contact information: [address] [place] [address] [phone]            Contact information for after-discharge care    Destination    [facility] Follow up.   Specialty:  Skilled Nursing Facility Contact information: [address] [place] [address] [phone]                  Major procedures and Radiology Reports - PLEASE review detailed and final reports thoroughly  -         Ct Abdomen Pelvis W Contrast  Result Date: 02/01/2017 CLINICAL DATA:  Initial evaluation for right-sided abdominal pain for 1 month. EXAM: CT ABDOMEN AND PELVIS WITH CONTRAST TECHNIQUE: Multidetector CT imaging of the abdomen and pelvis was performed using the standard protocol following bolus administration of intravenous contrast. CONTRAST:  ISOVUE-300 IOPAMIDOL (ISOVUE-300) INJECTION 61% COMPARISON:  Prior CT from 08/26/2016. FINDINGS: Lower chest: Small layering bilateral pleural effusions with associated bibasilar atelectasis. Cardiomegaly with pacemaker electrodes partially visualized. Hepatobiliary: Few scattered subcentimeter hypodensities noted within the liver, too small the characterize, but may reflect small cyst. Liver otherwise unremarkable. Mucosal edema with mild fuzzy stranding seen about the gallbladder, which may reflect sequelae of acute cholecystitis. Question trace free  pericholecystic fluid. No definite internal stones identified. No appreciable biliary dilatation. Pancreas: Pancreas within normal limits. Spleen: Spleen within normal limits. Adrenals/Urinary Tract: Adrenal glands are normal. Kidneys equal in size with symmetric enhancement. Few scattered subcentimeter hypodensities noted within the right kidney, too small the characterize, but statistically likely reflects small cyst. Kidneys otherwise unremarkable without nephrolithiasis, hydronephrosis, or focal enhancing renal mass. No hydroureter. Bladder largely decompressed. Circumferential bladder wall thickening likely related incomplete distension. Stomach/Bowel: Stomach within normal limits. No evidence for bowel obstruction. Appendix well visualized within the right lower quadrant and is within normal limits without associated inflammatory changes to suggest acute appendicitis. No acute inflammatory changes seen about the bowels. Large amount of impacted stool within the rectal vault, suggesting constipation, similar to previous. Vascular/Lymphatic: Moderate aorto bi-iliac atherosclerotic disease. No aneurysm. No pathologically enlarged intra-abdominal or pelvic lymph nodes. Reproductive: Small amount of fluid present within the endometrial canal. Uterus otherwise unremarkable. Ovaries within normal limits. Other: No free air or fluid. Fat containing paraumbilical hernia noted. With without closely approximated at the base of the hernia without associated inflammation or obstruction. Musculoskeletal: Mild diffuse anasarca noted, likely related to overall volume status. Progressive height loss with endplate irregularity within the visualized vertebral bodies of the thoracic spine, suspected to be related to dialysis/renal osteodystrophy given patient history. No definite acute osseous abnormality. Bilateral facet arthrosis noted within the lower lumbar spine. No other worrisome lytic or blastic osseous lesions.  IMPRESSION: 1. Mild mucosal enhancement with small volume free pericholecystic fluid and subtle hazy stranding about the gallbladder. Findings may reflect sequelae of acute  cholecystitis. Correlation were laboratory values recommended. Additionally, this could be further assessed with dedicated right upper quadrant ultrasound as indicated. 2. Large amount of impacted stool within the rectal vault, similar to previous. 3. Cardiomegaly with bilateral pleural effusions and diffuse anasarca. Associated bibasilar atelectasis within the lung bases. 4. 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 Electronically Signed   By: Rise Mu M.D.   On: 02/01/2017 23:28   Ir Perc Cholecystostomy  Result Date: 02/02/2017 INDICATION: 66 year old female with numerous medical problems including congestive heart failure, COPD, and end-stage renal disease. She has acute cholecystitis and is not considered a surgical candidate. She presents for placement of a percutaneous cholecystostomy tube. EXAM: CHOLECYSTOSTOMY MEDICATIONS: Patient recently received intravenous Zosyn. No additional antibiotic prophylaxis was administered. ANESTHESIA/SEDATION: Moderate (conscious) sedation was employed during this procedure. A total of Versed 1 mg and Fentanyl 100 mcg was administered intravenously. Moderate Sedation Time: 10 minutes. The patient's level of consciousness and vital signs were monitored continuously by radiology nursing throughout the procedure under my direct supervision. FLUOROSCOPY TIME:  Fluoroscopy Time: 0 minutes 54 seconds (3 mGy). COMPLICATIONS: None immediate. PROCEDURE: Informed written consent was obtained from the patient after a thorough discussion of the procedural risks, benefits and alternatives. All questions were addressed. Maximal Sterile Barrier Technique was utilized including  caps, mask, sterile gowns, sterile gloves, sterile drape, hand hygiene and skin antiseptic. A timeout was performed prior to the initiation of the procedure. The right upper quadrant was interrogated with ultrasound. The distended gallbladder was successfully identified. The colon was also identified and no was made of its location. Local anesthesia was attained by infiltration with 1% lidocaine. A small dermatotomy was made. Under real-time sonographic guidance, the gallbladder lumen was punctured with a 21 gauge micropuncture needle along a short transhepatic course. Care was taken to avoid the nearby hepatic flexure of the colon. There was return of dark brown cloudy bile through the needle hub. A gentle hand injection of contrast material confirmed opacification of the gallbladder lumen. A 0.018 wire was advanced through the needle and coiled in the gallbladder lumen. The needle was exchanged for the Accustick sheath. The micro wire was then exchanged for a short Amplatz wire. The skin tract was then dilated to 10 [place] and a Cook 10.2 [place] all-purpose drainage catheter was advanced over the wire and formed within the gallbladder. A gentle hand injection of contrast material confirms the tube is located within the gallbladder. The cystic duct is occluded. No definite filling defect to suggest acute cholecystitis. The cholecystostomy tube was connected to gravity bag drainage and secured to the skin with 0 Prolene suture. IMPRESSION: 1. Successful placement of a transhepatic percutaneous cholecystostomy tube for acute cholecystitis. No definite gallstones are visualized. PLAN: 1. Maintain drain to gravity drainage and flush daily. 2. Follow-up in interventional radiology drain clinic in 4 weeks. 3. Given the patient's medical history, she is unlikely to ever become a surgical candidate. If her cystic duct regains patency in the future we may be able to work toward removal of the drainage catheter. Signed,  Sterling Big, MD [facility] Electronically Signed   By: Malachy Moan M.D.   On: 02/02/2017 17:41    Micro Results    Recent Results (from the past 240 hour(s))  Surgical pcr screen     Status: None   Collection Time: 02/02/17  2:28  AM  Result Value Ref Range Status   MRSA, PCR NEGATIVE NEGATIVE Final   Staphylococcus aureus NEGATIVE NEGATIVE Final    Comment:        The Xpert SA Assay (FDA approved for NASAL specimens in patients over 53 years of age), is one component of a comprehensive surveillance program.  Test performance has been validated by [facility] for patients greater than or equal to 87 year old. It is not intended to diagnose infection nor to guide or monitor treatment.     Today   Subjective    Brandy Dudley today has no headache,no chest abdominal pain,no new weakness tingling or numbness, feels much better   Objective   Blood pressure 129/69, pulse 62, temperature 98.1 F (36.7 C), temperature source Oral, resp. rate 18, height 5\' 4"  (1.626 m), weight 66.9 kg (147 lb 7.8 oz), SpO2 98 %.   Intake/Output Summary (Last 24 hours) at 02/06/17 1217 Last data filed at 02/06/17 1048  Gross per 24 hour  Intake                0 ml  Output              150 ml  Net             -150 ml    Exam Awake Alert, Oriented x 3, No new F.N deficits, Normal affect [place].AT,PERRAL Supple Neck,No JVD, No cervical lymphadenopathy appriciated.  Symmetrical Chest wall movement, Good air movement bilaterally, CTAB RRR,No Gallops,Rubs or new Murmurs, No Parasternal Heave +ve B.Sounds, Abd Soft, Non tender, No organomegaly appriciated, No rebound -guarding or rigidity. RUQ drain stable No Cyanosis, Clubbing or edema, No new Rash or bruise   Data Review   CBC w Diff:  Lab Results  Component Value Date   WBC 8.8 02/06/2017   HGB 8.9 (L) 02/06/2017   HCT 30.3 (L) 02/06/2017   PLT 332 02/06/2017    LYMPHOPCT 15 02/03/2017   MONOPCT 8 02/03/2017   EOSPCT 1 02/03/2017   BASOPCT 0 02/03/2017    CMP:  Lab Results  Component Value Date   NA 136 02/06/2017   NA 139 11/16/2016   K 3.4 (L) 02/06/2017   CL 100 (L) 02/06/2017   CO2 27 02/06/2017   BUN 19 02/06/2017   BUN 29 (A) 07/26/2016   CREATININE 4.70 (H) 02/06/2017   GLU 102 11/16/2016   PROT 8.3 (H) 02/01/2017   ALBUMIN 1.7 (L) 02/06/2017   BILITOT 0.6 02/01/2017   ALKPHOS 199 (H) 02/01/2017   AST 15 02/01/2017   ALT 9 (L) 02/01/2017  .   Total Time in preparing paper work, data evaluation and todays exam - 35 minutes  Leroy Sea M.D on 02/06/2017 at 12:17 PM  [facility]   Office  [phone]

## 2017-02-06 NOTE — Care Management Note (Signed)
Case Management Note  Patient Details  Name: [name] MRN: [id number] Date of Birth: 02-Oct-1951  Subjective/Objective:         Admitted wth Acute cholecystitis, s/p gallbladder drain placement by IR on 02/02/2017.        Action/Plan: Plan is to d/c today to SNF. CSW managing disposition to SNF.  Expected Discharge Date:  02/06/17               Expected Discharge Plan:  Skilled Nursing Facility  In-House Referral:  Clinical Social Work  Discharge planning Services  CM Consult  Post Acute Care Choice:    Choice offered to:     DME Arranged:    DME Agency:     HH Arranged:    HH Agency:     Status of Service:  Completed, signed off  If discussed at [employer], dates discussed:    Additional Comments:  Epifanio Lesches, RN 02/06/2017, 1:42 PM

## 2017-02-06 NOTE — Procedures (Signed)
I was present at this dialysis session. I have reviewed the session itself and made appropriate changes.   Filed Weights   02/03/17 1550 02/03/17 1956 02/05/17 0532  Weight: 69.5 kg (153 lb 3.5 oz) 69.5 kg (153 lb 3.5 oz) 67 kg (147 lb 11.3 oz)     Recent Labs Lab 02/03/17 1600  NA 138  K 3.7  CL 97*  CO2 27  GLUCOSE 86  BUN 35*  CREATININE 4.89*  CALCIUM 8.3*  PHOS 5.3*     Recent Labs Lab 02/01/17 1924 02/03/17 1559 02/04/17 0830 02/06/17 0500  WBC 10.7* 9.7 8.6 8.8  NEUTROABS 8.6* 7.4  --   --   HGB 9.2* 9.0* 9.4* 8.9*  HCT 30.3* 30.1* 31.8* 30.3*  MCV 81.7 81.4 81.3 81.7  PLT 287 338 296 332    Scheduled Meds: . amiodarone  200 mg Oral Daily  . amoxicillin-clavulanate  1 tablet Oral Q24H  . atorvastatin  10 mg Oral QHS  . budesonide  0.25 mg Nebulization BID  . docusate sodium  200 mg Oral BID  . feeding supplement (PRO-STAT SUGAR FREE 64)  30 mL Oral BID  . heparin subcutaneous  5,000 Units Subcutaneous Q8H  . hydrocortisone  25 mg Rectal BID  . insulin aspart  0-9 Units Subcutaneous Q8H  . metoprolol succinate  12.5 mg Oral BID  . sertraline  25 mg Oral Daily   Continuous Infusions: PRN Meds:.sodium chloride, albuterol, alteplase, heparin, heparin, HYDROcodone-acetaminophen, metoprolol, morphine injection, nitroGLYCERIN, [DISCONTINUED] ondansetron **OR** ondansetron (ZOFRAN) IV, simethicone   Irena Cords,  MD 02/06/2017, 9:10 AM

## 2017-02-06 NOTE — Evaluation (Signed)
Physical Therapy Evaluation Patient Details Name: Brandy Dudley MRN: [id number] DOB: 1951/05/20 Today's Date: 02/06/2017   History of Present Illness  Pt is a 66 y/o female with a PMH significant for chronic systolic heart failure s/p AICD placement, CAD s/p stenting, ESRD on HD T/Th/S, DM. She presents from SNF with persistent abdominal pain. CT concerning for acute cholecystitis, and pt underwent transhepatic percutaneous cholecystostomy placement on 02/02/17.  Clinical Impression  Pt admitted with above diagnosis. Pt currently with functional limitations due to the deficits listed below (see PT Problem List). At the time of PT eval pt was too fatigued and painful from HD to participate with functional mobility. Evaluation was completed at the bed level with strength and range of motion testing in supine. Pt will benefit from skilled PT to increase their independence and safety with mobility to allow discharge to the venue listed below.       Follow Up Recommendations SNF;Supervision/Assistance - 24 hour    Equipment Recommendations  None recommended by PT    Recommendations for Other Services       Precautions / Restrictions Precautions Precautions: Fall Restrictions Weight Bearing Restrictions: No      Mobility  Bed Mobility               General bed mobility comments: Pt just returned from HD and was unable to tolerate any bed mobility at this time. Evaluation was completed at a bed-level with pt in supine.   Transfers                    Ambulation/Gait                Stairs            Wheelchair Mobility    Modified Rankin (Stroke Patients Only)       Balance                                             Pertinent Vitals/Pain Pain Assessment: Faces Faces Pain Scale: Hurts whole lot Pain Location: General pain from HD and specifically at incision site Pain Descriptors / Indicators: Operative site guarding;Discomfort Pain  Intervention(s): Monitored during session;Limited activity within patient's tolerance    Home Living Family/patient expects to be discharged to:: Skilled nursing facility                 Additional Comments: From [facility]    Prior Function Level of Independence: Needs assistance   Gait / Transfers Assistance Needed: Pt reports she does not walk, and is at a transfers only level to/from the wheelchair. It appears that staff helps her scoot or they assist her with a 2 person squat pivot transfer.            Hand Dominance   Dominant Hand: Right    Extremity/Trunk Assessment   Upper Extremity Assessment Upper Extremity Assessment: Generalized weakness    Lower Extremity Assessment Lower Extremity Assessment: Generalized weakness    Cervical / Trunk Assessment Cervical / Trunk Assessment:  (Unable to be assessed this session)  Communication   Communication: No difficulties  Cognition Arousal/Alertness: Awake/alert Behavior During Therapy: Flat affect Overall Cognitive Status: No family/caregiver present to determine baseline cognitive functioning (Likely at baseline)  General Comments General comments (skin integrity, edema, etc.): Pt was able to demonstrate the ability to perform active heel slide bilaterally with pt in supine. She reports pain in the R knee with end range flexion. She was able to actively push LE's out into extension with resistance from therapist. The pt was unable to lift either LE up off bed against gravity and was at a 2/5 strength level in hip flexors. She was limited in R shoulder flexion reportedly due to pain from incision site.     Exercises     Assessment/Plan    PT Assessment Patient needs continued PT services  PT Problem List Decreased strength;Decreased range of motion;Decreased activity tolerance;Decreased balance;Decreased mobility;Decreased knowledge of use of DME;Decreased safety  awareness;Decreased knowledge of precautions;Pain       PT Treatment Interventions DME instruction;Functional mobility training;Therapeutic activities;Therapeutic exercise;Neuromuscular re-education;Patient/family education    PT Goals (Current goals can be found in the Care Plan section)  Acute Rehab PT Goals Patient Stated Goal: Go back to [facility] PT Goal Formulation: With patient Time For Goal Achievement: 02/20/17 Potential to Achieve Goals: Good    Frequency Min 2X/week   Barriers to discharge        Co-evaluation               End of Session   Activity Tolerance: Patient limited by fatigue;Patient limited by lethargy;Patient limited by pain Patient left: in bed;with call [place]/phone within reach;with bed alarm set Nurse Communication: Mobility status;Need for lift equipment PT Visit Diagnosis: Muscle weakness (generalized) (M62.81)         Time: 9935-7017 PT Time Calculation (min) (ACUTE ONLY): 13 min   Charges:   PT Evaluation $PT Eval Moderate Complexity: 1 Procedure     PT G Codes:         Marylynn Pearson 02/06/2017, 3:26 PM   Conni Slipper, PT, DPT Acute Rehabilitation Services Pager: [phone]

## 2017-02-06 NOTE — NC FL2 (Signed)
[place] MEDICAID FL2 LEVEL OF CARE SCREENING TOOL     IDENTIFICATION  Patient Name: Brandy Dudley Birthdate: 23-Apr-1951 Sex: female Admission Date (Current Location): 02/01/2017  [facility] and [place] Number:  Producer, television/film/video and Address:  [facility], [address] [place], [place] [address]      Provider Number: [id number]  Attending Physician Name and Address:  Leroy Sea, MD  Relative Name and Phone Number:  [name], sister,     Current Level of Care: Hospital Recommended Level of Care: Skilled Nursing Facility Prior Approval Number:    Date Approved/Denied:   PASRR Number: [id number]  Discharge Plan: SNF    Current Diagnoses: Patient Active Problem List   Diagnosis Date Noted  . Acute cholecystitis 02/02/2017  . Preoperative clearance   . Protein-calorie malnutrition, severe 07/18/2016  . Positive blood culture 07/17/2016  . Atherosclerotic peripheral vascular disease (HCC) 07/17/2016  . Bacteremia 07/17/2016  . Depression 07/05/2016  . Umbilical hernia without obstruction or gangrene 07/05/2016  . Generalized anxiety disorder 07/05/2016  . Loss of weight 07/05/2016  . Chronic combined systolic and diastolic CHF (congestive heart failure) (HCC) 05/07/2016  . 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 Anemia of chronic renal failure, stage 5 (HCC) 04/14/2016  . ESRD on dialysis (HCC)   . History of Clostridium difficile colitis 03/20/2016  . COPD (chronic obstructive pulmonary disease) (HCC) 03/20/2016  . Obstructive sleep apnea 03/20/2016  . AICD (automatic cardioverter/defibrillator) present 03/20/2016  . Physical deconditioning 03/02/2016  . DVT (deep venous thrombosis), right 03/02/2016  . Cardiomyopathy, ischemic 03/02/2016  . Coronary artery disease involving native coronary artery of native heart without angina pectoris 03/02/2016  . Diabetes mellitus type 2 in obese (HCC) 03/02/2016     Orientation RESPIRATION BLADDER Height & Weight     Self, Situation, Place  Normal Continent Weight: 66.9 kg (147 lb 7.8 oz) Height:  5\' 4"  (162.6 cm)  BEHAVIORAL SYMPTOMS/MOOD NEUROLOGICAL BOWEL NUTRITION STATUS      Incontinent Diet (Please see DC Summary)  AMBULATORY STATUS COMMUNICATION OF NEEDS Skin   Limited Assist Verbally Normal                       Personal Care Assistance Level of Assistance  Bathing, Feeding, Dressing Bathing Assistance: Maximum assistance Feeding assistance: Independent Dressing Assistance: Limited assistance     Functional Limitations Info             SPECIAL CARE FACTORS FREQUENCY                       Contractures      Additional Factors Info  Code Status, Allergies, Insulin Sliding Scale Code Status Info: Full Allergies Info: Entresto Sacubitril-valsartan, Latex, Sulfa Antibiotics   Insulin Sliding Scale Info: Every 4 hours       Current Medications (02/06/2017):  This is the current hospital active medication list Current Facility-Administered Medications  Medication Dose Route Frequency Provider Last Rate Last Dose  . 0.9 %  sodium chloride infusion  100 mL Intravenous PRN Megan Mans Ejigiri, PA-C      . albuterol (PROVENTIL) (2.5 MG/3ML) 0.083% nebulizer solution 2.5 mg  2.5 mg Inhalation Q6H PRN Eduard Clos, MD      . alteplase (CATHFLO ACTIVASE) injection 2 mg  2 mg Intracatheter Once PRN Tomasa Blase, PA-C      . amiodarone (PACERONE) tablet 200 mg  200 mg Oral Daily Leroy Sea, MD   200 mg at 02/05/17 1012  . amoxicillin-clavulanate (AUGMENTIN) 500-125 MG per tablet 500 mg  1 tablet Oral Q24H Kimberly B Hammons, RPH   500 mg at 02/05/17 1352  . atorvastatin (LIPITOR) tablet 10 mg  10 mg Oral QHS Leroy Sea, MD   10 mg at 02/05/17 2342  . budesonide (PULMICORT) nebulizer solution 0.25 mg  0.25 mg Nebulization BID Eduard Clos, MD   0.25 mg at 02/05/17 2125  . docusate sodium  (COLACE) capsule 200 mg  200 mg Oral BID Leroy Sea, MD   200 mg at 02/05/17 2341  . feeding supplement (PRO-STAT SUGAR FREE 64) liquid 30 mL  30 mL Oral BID Tomasa Blase, PA-C   30 mL at 02/05/17 1012  . heparin injection 1,000 Units  1,000 Units Dialysis PRN Megan Mans Ejigiri, PA-C      . heparin injection 5,000 Units  5,000 Units Subcutaneous Q8H Leroy Sea, MD   5,000 Units at 02/06/17 0263  . heparin injection 6,000 Units  6,000 Units Dialysis PRN Megan Mans Ejigiri, PA-C      . HYDROcodone-acetaminophen (NORCO/VICODIN) 5-325 MG per tablet 1 tablet  1 tablet Oral Q6H PRN Leroy Sea, MD   1 tablet at 02/05/17 1722  . hydrocortisone (ANUSOL-HC) suppository 25 mg  25 mg Rectal BID Leroy Sea, MD   25 mg at 02/04/17 2158  . insulin aspart (novoLOG) injection 0-9 Units  0-9 Units Subcutaneous Q8H Leroy Sea, MD      . metoprolol (LOPRESSOR) injection 5 mg  5 mg Intravenous Q6H PRN Leroy Sea, MD      . metoprolol succinate (TOPROL-XL) 24 hr tablet 12.5 mg  12.5 mg Oral BID Leroy Sea, MD   12.5 mg at 02/05/17 2342  . morphine 2 MG/ML injection 2 mg  2 mg Intravenous Q4H PRN Eduard Clos, MD   2 mg at 02/04/17 0151  . nitroGLYCERIN (NITROSTAT) SL tablet 0.4 mg  0.4 mg Sublingual Q5 min PRN Eduard Clos, MD      . ondansetron [facility]) injection 4 mg  4 mg Intravenous Q6H PRN Eduard Clos, MD      . sertraline (ZOLOFT) tablet 25 mg  25 mg Oral Daily Leroy Sea, MD   25 mg at 02/05/17 1013  . simethicone (MYLICON) 40 MG/0.6ML suspension 80 mg  80 mg Oral QID PRN Leroy Sea, MD   80 mg at 02/05/17 1726     Discharge Medications: Please see discharge summary for a list of discharge medications.  Relevant Imaging Results:  Relevant Lab Results:   Additional Information ss#[id number].  Dialysis patient - TTS at [facility].  Mearl Latin, LCSWA

## 2017-02-06 NOTE — Discharge Instructions (Signed)
Follow with Primary MD [name], MD in 7 days   Get CBC, CMP, 2 view Chest X ray checked  by Primary MD or SNF MD in 5-7 days ( we routinely change or add medications that can affect your baseline labs and fluid status, therefore we recommend that you get the mentioned basic workup next visit with your PCP, your PCP may decide not to get them or add new tests based on their clinical decision)  Activity: As tolerated with Full fall precautions use walker/cane & assistance as needed  Disposition SNF  Diet:   Renal diet, fluid restriction 1.5 L / day  Accuchecks 4 times/day, Once in AM empty stomach and then before each meal. Log in all results and show them to your Prim.MD in 3 days. If any glucose reading is under 80 or above 300 call your Prim MD immidiately. Follow Low glucose instructions for glucose under 80 as instructed.   For Heart failure patients - Check your Weight same time everyday, if you gain over 2 pounds, or you develop in leg swelling, experience more shortness of breath or chest pain, call your Primary MD immediately. Follow Cardiac Low Salt Diet and 1.5 lit/day fluid restriction.  On your next visit with your primary care physician please Get Medicines reviewed and adjusted.  Please request your Prim.MD to go over all Hospital Tests and Procedure/Radiological results at the follow up, please get all Hospital records sent to your Prim MD by signing hospital release before you go home.  If you experience worsening of your admission symptoms, develop shortness of breath, life threatening emergency, suicidal or homicidal thoughts you must seek medical attention immediately by calling 911 or calling your MD immediately  if symptoms less severe.  You Must read complete instructions/literature along with all the possible adverse reactions/side effects for all the Medicines you take and that have been prescribed to you. Take any new Medicines after you have completely  understood and accpet all the possible adverse reactions/side effects.   Do not drive, operate heavy machinery, perform activities at heights, swimming or participation in water activities or provide baby sitting services if your were admitted for syncope or siezures until you have seen by Primary MD or a Neurologist and advised to do so again.  Do not drive when taking Pain medications.    Do not take more than prescribed Pain, Sleep and Anxiety Medications  Special Instructions: If you have smoked or chewed Tobacco  in the last 2 yrs please stop smoking, stop any regular Alcohol  and or any Recreational drug use.  Wear Seat belts while driving.   Please note  You were cared for by a hospitalist during your hospital stay. If you have any questions about your discharge medications or the care you received while you were in the hospital after you are discharged, you can call the unit and asked to speak with the hospitalist on call if the hospitalist that took care of you is not available. Once you are discharged, your primary care physician will handle any further medical issues. Please note that NO REFILLS for any discharge medications will be authorized once you are discharged, as it is imperative that you return to your primary care physician (or establish a relationship with a primary care physician if you do not have one) for your aftercare needs so that they can reassess your need for medications and monitor your lab values.      Follow up with Dr. Janee Morn, [facility] in 3-4 weeks after your drain appointment follow up. Call our office to schedule an appointment.   Cholecystitis Cholecystitis is inflammation of the gallbladder. It is often called a gallbladder attack. The gallbladder is a pear-shaped organ that lies beneath the liver on the right side of the body. The gallbladder stores bile, which is a fluid that helps the body to digest fats. If bile builds up in your  gallbladder, your gallbladder becomes inflamed. This condition may occur suddenly (be acute). Repeat episodes of acute cholecystitis or prolonged episodes may lead to a long-term (chronic) condition. Cholecystitis is serious and it requires treatment. What are the causes? The most common cause of this condition is gallstones. Gallstones can block the tube (duct) that carries bile out of your gallbladder. This causes bile to build up. Other causes of this condition include:  Damage to the gallbladder due to a decrease in blood flow.  Infections in the bile ducts.  Scars or kinks in the bile ducts.  Tumors in the liver, pancreas, or gallbladder. What increases the risk? This condition is more likely to develop in:  People who have sickle cell disease.  People who take birth control pills or use estrogen.  People who have alcoholic liver disease.  People who have liver cirrhosis.  People who have their nutrition delivered through a vein (parenteral nutrition).  People who do not eat or drink (do fasting) for a long period of time.  People who are obese.  People who have rapid weight loss.  People who are pregnant.  People who have increased triglyceride levels.  People who have pancreatitis. What are the signs or symptoms? Symptoms of this condition include:  Abdominal pain, especially in the upper right area of the abdomen.  Abdominal tenderness or bloating.  Nausea.  Vomiting.  Fever.  Chills.  Yellowing of the skin and the whites of the eyes (jaundice). How is this diagnosed? This condition is diagnosed with a medical history and physical exam. You may also have other tests, including:  Imaging tests, such as:  An ultrasound of the gallbladder.  A CT scan of the abdomen.  A gallbladder nuclear scan (HIDA scan). This scan allows your health care provider to see the bile moving from your liver to your gallbladder and to your small intestine.  MRI.  Blood  tests, such as:  A complete blood count, because the white blood cell count may be higher than normal.  Liver function tests, because some levels may be higher than normal with certain types of gallstones. How is this treated? Treatment may include:  Fasting for a certain amount of time.  IV fluids.  Medicine to treat pain or vomiting.  Antibiotic medicine.  Surgery to remove your gallbladder (cholecystectomy). This may happen immediately or at a later time. Follow these instructions at home: Home care will depend on your treatment. In general:  Take over-the-counter and prescription medicines only as told by your health care provider.  If you were prescribed an antibiotic medicine, take it as told by your health care provider. Do not stop taking the antibiotic even if you start to feel better.  Follow instructions from your health care provider about what to eat or drink. When you are allowed to eat, avoid eating or drinking anything that triggers your symptoms.  Keep all follow-up visits as told by your health care provider. This is important. Contact a health care provider if:  Your pain is not controlled with medicine.  You  have a fever. Get help right away if:  Your pain moves to another part of your abdomen or to your back.  You continue to have symptoms or you develop new symptoms even with treatment. This information is not intended to replace advice given to you by your health care provider. Make sure you discuss any questions you have with your health care provider. Document Released: 11/13/2005 Document Revised: 03/23/2016 Document Reviewed: 02/24/2015 Elsevier Interactive Patient Education  2017 [employer].

## 2017-02-06 NOTE — Progress Notes (Signed)
RT came to do neb tx. Pt absent from room

## 2017-02-06 NOTE — Progress Notes (Signed)
Patient needs to be seen by PT to be able to return to SNF. RN alerted.  Brandy Dudley LCSWA [phone]

## 2017-02-06 NOTE — Progress Notes (Signed)
CSW received call from [facility] is under review from Wellsite geologist. Patient will discharge tomorrow.   Osborne Casco Daxter Paule LCSWA [phone]

## 2017-02-07 LAB — GLUCOSE, CAPILLARY
GLUCOSE-CAPILLARY: 87 mg/dL (ref 65–99)
GLUCOSE-CAPILLARY: 132 mg/dL — AB (ref 65–99)
GLUCOSE-CAPILLARY: 116 mg/dL — AB (ref 65–99)
Glucose-Capillary: 130 mg/dL — ABNORMAL HIGH (ref 65–99)
Glucose-Capillary: 135 mg/dL — ABNORMAL HIGH (ref 65–99)
Glucose-Capillary: 90 mg/dL (ref 65–99)

## 2017-02-07 NOTE — Progress Notes (Signed)
Report given to [facility].  

## 2017-02-07 NOTE — Progress Notes (Signed)
Patient will DC to: [name] Anticipated DC date: 02/07/17 Family notified: Sister, [name] Transport by: Sharin Mons   Per MD patient ready for DC to [facility]. RN, patient, patient's family, and facility notified of DC. Discharge Summary sent to facility. RN given number for report. DC packet on chart. Ambulance transport requested for patient.   CSW signing off.  Cristobal Goldmann, [place] Clinical Social Worker [phone]

## 2017-02-07 NOTE — Care Management Important Message (Signed)
Important Message  Patient Details  Name: Brandy Dudley MRN: [id number] Date of Birth: March 06, 1951   Medicare Important Message Given:  Yes    Kyla Balzarine 02/07/2017, 11:35 AM

## 2017-02-07 NOTE — Progress Notes (Signed)
[facility] Progress Note  Assessment/Plan: 1. Acute cholecystitis- s/p cholecystostomy tube 02/02/17 by IR due to poor surgical candidate/ transitioned to PO Augmentin  2. ESRD continue with HD qTTS.   3. 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 Nutrition: renal diet 6. Hypertension:stable 7. Dispo- she lives at [facility] and will requiring ongoing skilled nursing after discharge.  Subjective: Still some abd soreness but better than admit ate breakfast this morning. Likely D/C today   Objective Vitals:   02/06/17 1355 02/06/17 2019 02/07/17 0818 02/07/17 0948  BP: (!) 133/49 (!) 112/50  (!) 110/54  Pulse: 62 60  61  Resp: 19 18    Temp: 97 F (36.1 C) 99.3 F (37.4 C)    TempSrc: Axillary Oral    SpO2: 95% 100% 97%   Weight:      Height:       Physical Exam General: Elderly female NAD Heart: RRR Lungs: CTAB  Abdomen: chole tube in place Extremities: no LE edema  Dialysis Access: RIJ cath   Dialysis Orders:  [facility] 4h 400/800 2/2.25 bath 72.5kg P2 Hep 6000 R IJ TDC - Mircera 225 ug every 2 wks last 3/8 - labs: Hb 9.3 Tsat 19% P3.9 PTH 1488  Tomasa Blase PA-C [facility] Pager [phone] 02/07/2017,10:52 AM  LOS: 5 days   Additional Objective Labs: Basic Metabolic Panel:  Recent Labs Lab 02/01/17 1924 02/03/17 1600 02/06/17 0700  NA 135 138 136  K 3.0* 3.7 3.4*  CL 98* 97* 100*  CO2 28 27 27   GLUCOSE 80 86 98  BUN 18 35* 19  CREATININE 2.91* 4.89* 4.70*  CALCIUM 7.8* 8.3* 8.2*  PHOS  --  5.3* 4.9*   Liver Function Tests:  Recent Labs Lab 02/01/17 1924 02/03/17 1600 02/06/17 0700  AST 15  --   --   ALT 9*  --   --   ALKPHOS 199*  --   --   BILITOT 0.6  --   --   PROT 8.3*  --   --   ALBUMIN 1.8* 1.7* 1.7*    Recent Labs Lab 02/01/17 1924  LIPASE 19   CBC:  Recent Labs Lab 02/01/17 1924  02/03/17 1559 02/04/17 0830 02/06/17 0500  WBC 10.7* 9.7 8.6 8.8  NEUTROABS 8.6* 7.4  --   --   HGB 9.2* 9.0* 9.4* 8.9*  HCT 30.3* 30.1* 31.8* 30.3*  MCV 81.7 81.4 81.3 81.7  PLT 287 338 296 332   Blood Culture    Component Value Date/Time   SDES BLOOD RIGHT HAND 07/18/2016 1321   SPECREQUEST BOTTLES DRAWN AEROBIC AND ANAEROBIC 4CC EA 07/18/2016 1321   CULT NO GROWTH 5 DAYS 07/18/2016 1321   REPTSTATUS 07/23/2016 FINAL 07/18/2016 1321    Cardiac Enzymes: No results for input(s): CKTOTAL, CKMB, CKMBINDEX, TROPONINI in the last 168 hours. CBG:  Recent Labs Lab 02/06/17 1555 02/06/17 2015 02/07/17 0011 02/07/17 0532 02/07/17 0801  GLUCAP 126* 135* 130* 90 87   Iron Studies: No results for input(s): IRON, TIBC, TRANSFERRIN, FERRITIN in the last 72 hours. Lab Results  Component Value Date   INR 1.43 02/02/2017   INR 1.85 07/15/2016   INR 2.62 (H) 05/08/2016   Medications:  . amiodarone  200 mg Oral Daily  . amoxicillin-clavulanate  1 tablet Oral Q24H  . atorvastatin  10 mg Oral QHS  . budesonide  0.25 mg  Nebulization BID  . docusate sodium  200 mg Oral BID  . feeding supplement (PRO-STAT SUGAR FREE 64)  30 mL Oral BID  . heparin subcutaneous  5,000 Units Subcutaneous Q8H  . hydrocortisone  25 mg Rectal BID  . insulin aspart  0-9 Units Subcutaneous Q8H  . metoprolol succinate  12.5 mg Oral BID  . sertraline  25 mg Oral Daily     I have seen and examined this patient and agree with plan and assessment in the above note with renal recommendations/intervention highlighted.  For discharge back to [facility] today.  F/u with outpatient HD tomorrow.  Julien Nordmann Kadience Macchi,MD 02/07/2017 3:54 PM

## 2017-02-07 NOTE — Progress Notes (Signed)
Pt seen and examined at bedside, tolerating diet well. Stable for discharge. Please see discharge summary by Dr. Thedore Mins on 02/06/2017. I have reviewed d/c summary, no changes needed.   Debbora Presto, MD  [facility] Pager [phone]  If 7PM-7AM, please contact night-coverage [email or web address] Password [email or web address]

## 2017-02-08 ENCOUNTER — Telehealth: Payer: Self-pay

## 2017-02-08 ENCOUNTER — Non-Acute Institutional Stay (SKILLED_NURSING_FACILITY): Payer: Medicare PPO | Admitting: Internal Medicine

## 2017-02-08 ENCOUNTER — Encounter: Payer: Self-pay | Admitting: Internal Medicine

## 2017-02-08 DIAGNOSIS — D638 Anemia in other chronic diseases classified elsewhere: Secondary | ICD-10-CM

## 2017-02-08 DIAGNOSIS — Z794 Long term (current) use of insulin: Secondary | ICD-10-CM

## 2017-02-08 DIAGNOSIS — K810 Acute cholecystitis: Secondary | ICD-10-CM | POA: Diagnosis not present

## 2017-02-08 DIAGNOSIS — J9612 Chronic respiratory failure with hypercapnia: Secondary | ICD-10-CM

## 2017-02-08 DIAGNOSIS — E1122 Type 2 diabetes mellitus with diabetic chronic kidney disease: Secondary | ICD-10-CM | POA: Diagnosis not present

## 2017-02-08 DIAGNOSIS — J9611 Chronic respiratory failure with hypoxia: Secondary | ICD-10-CM

## 2017-02-08 DIAGNOSIS — R195 Other fecal abnormalities: Secondary | ICD-10-CM | POA: Diagnosis not present

## 2017-02-08 DIAGNOSIS — R531 Weakness: Secondary | ICD-10-CM | POA: Diagnosis not present

## 2017-02-08 DIAGNOSIS — E876 Hypokalemia: Secondary | ICD-10-CM

## 2017-02-08 DIAGNOSIS — N186 End stage renal disease: Secondary | ICD-10-CM

## 2017-02-08 DIAGNOSIS — Z992 Dependence on renal dialysis: Secondary | ICD-10-CM

## 2017-02-08 NOTE — Progress Notes (Signed)
LOCATION: [facility]  PCP: [name], MD   Code Status: Full Code  Goals of care: Advanced Directive information Advanced Directives 02/05/2017  Does Patient Have a Medical Advance Directive? No  Type of Advance Directive -  Does patient want to make changes to medical advance directive? No - Patient declined  Copy of Healthcare Power of Attorney in Chart? -  Would patient like information on creating a medical advance directive? -       Extended Emergency Contact Information Primary Emergency Contact: Haith,[name] Address: [address]          [place], [place] [address] [place] Home Phone: [phone] Relation: Sister Secondary Emergency Contact: Marcello Moores, [place] Home Phone: [phone] Relation: Sister   Allergies  Allergen Reactions  . Entresto [Sacubitril-Valsartan] Other (See Comments)     unknown reaction  . Latex Itching  . Sulfa Antibiotics Itching    Chief Complaint  Patient presents with  . Readmit To SNF    Readmission Visit      HPI:  Patient is a 66 y.o. female seen today for long term care post hospital admission from 02/01/17-02/06/17 with acute abdominal pain. She was diagnosed with acute cholecystitis and had a gall bladder drain placement by IR for conservative management. She was placed on antibiotic for empiric coverage and this was discontinued at time of discharge.She has PMH of chronic systolic CHF, S/p AICD, CAD s/p stent, ESRD on HD, DM type 2 among others. She is seen in her room today.   Review of Systems:  Constitutional: Negative for fever, chills, diaphoresis. Energy level is slowly coming back. HENT: Negative for headache, congestion, nasal discharge, sore throat, difficulty swallowing.   Eyes: Negative for blurred vision, double vision and discharge.  Respiratory: Negative for cough, shortness of breath and wheezing.   Cardiovascular: Negative for chest pain,  palpitations, leg swelling.  Gastrointestinal: Negative for heartburn, nausea, vomiting,loss of appetite. Last bowel movement was this morning with loose stool. She had 4 bowel movement yesterday. Denies melena or rectal bleed. Positive for soreness to her abdominal wall.  Genitourinary: Negative for dysuria.  Musculoskeletal: Negative for fall in the facility.  Skin: Negative for itching, rash.  Neurological: Negative for dizziness. Psychiatric/Behavioral: Negative for depression   Past Medical History:  Diagnosis Date  . AICD (automatic cardioverter/defibrillator) present    Biotronik Inventra 7 VR-T DX 04/12/15  . Anemia   . Anxiety   . C. difficile colitis   . Cardiomyopathy (HCC)    02/02/16 ([facility]): Mixed ischemic and non-ischemic cardiomyopathy  . CHF (congestive heart failure) (HCC)    systolic  . Cholelithiases 01/2017  . COPD (chronic obstructive pulmonary disease) (HCC)   . Coronary artery disease   . Diabetes mellitus (HCC)   . DVT (deep venous thrombosis) (HCC)    on coumadin  . ESRD (end stage renal disease) (HCC)    tues/thurs/sat dialysis  . Hypertension   . Neuropathy (HCC)   . Obstructive sleep apnea    no cpap  . Renal disorder   . Wears glasses    Past Surgical History:  Procedure Laterality Date  . AV FISTULA PLACEMENT Left 04/03/2016   Procedure: ARTERIOVENOUS (AV) FISTULA CREATION;  Surgeon: Larina Earthly, MD;  Location: [facility] OR;  Service: Vascular;  Laterality: Left;  . AV FISTULA PLACEMENT Right 08/21/2016   Procedure: INSERTION RIGHT ARM ARTERIOVENOUS GRAFT USING 4-7MM  X45  CM ACUSEAL GRAFT;  Surgeon: Maeola Harman, MD;  Location: [facility] OR;  Service: Vascular;  Laterality: Right;  . CORONARY ANGIOPLASTY     LCX stent 2010; by [facility] 01/2016 note: 03/2014 LHC/RHC: minor luminal irregularities LAD and RCA with patent stent first marginal branch of CX. RA 11, PAP 58/22 with mean 39, mean wedge pressure 19, LVEDP 27, PVR 3.9, CO 5.1,  Cardiac index 2.5.   . FISTULA SUPERFICIALIZATION Left 06/16/2016   Procedure: SUPERFICIALIZATION OF LEFT ARM BRACHIOCEPHALIC ARTERIOVENOUS FISTULA;  Surgeon: Chuck Hint, MD;  Location: [facility] OR;  Service: Vascular;  Laterality: Left;  . INSERTION OF DIALYSIS CATHETER Left 04/03/2016   Procedure: INSERTION OF DIALYSIS CATHETER;  Surgeon: Larina Earthly, MD;  Location: [facility] OR;  Service: Vascular;  Laterality: Left;  . IR GENERIC HISTORICAL  02/02/2017   IR PERC CHOLECYSTOSTOMY 02/02/2017 Malachy Moan, MD [facility]-INTER[facility] RAD  . LIGATION OF COMPETING BRANCHES OF ARTERIOVENOUS FISTULA Left 06/16/2016   Procedure: LIGATION OF COMPETING BRANCHES OF left brachiocephalic ARTERIOVENOUS FISTULA;  Surgeon: Chuck Hint, MD;  Location: [facility] OR;  Service: Vascular;  Laterality: Left;  . OTHER SURGICAL HISTORY  02/02/2017   Transhepatic percutaneous cholecystostomy placement  . PERIPHERAL VASCULAR CATHETERIZATION N/A 07/20/2016   Procedure: A/V Shuntogram/Fistulagram;  Surgeon: Fransisco Hertz, MD;  Location[facility] INVASIVE CV LAB;  Service: Cardiovascular;  Laterality: N/A;  . PERIPHERAL VASCULAR CATHETERIZATION N/A 07/20/2016   Procedure: Dialysis/Perma Catheter Insertion;  Surgeon: Fransisco Hertz, MD;  Location: [facility] INVASIVE CV LAB;  Service: Cardiovascular;  Laterality: N/A;  . REVISION OF ARTERIOVENOUS GORETEX GRAFT Left 06/16/2016   Procedure: REVISION OF LEFT BRACHIOCEPHALIC ARTERIOVENOUS FISTULA WITH RESECTION OF REDUNDANT SECTION;  Surgeon: Chuck Hint, MD;  Location: [facility] OR;  Service: Vascular;  Laterality: Left;   Social History:   reports that she quit smoking about 10 years ago. Her smoking use included Cigarettes. She has never used smokeless tobacco. She reports that she does not drink alcohol or use drugs.  No family history on file.  Medications: Allergies as of 02/08/2017      Reactions   Entresto [sacubitril-valsartan] Other (See Comments)    unknown reaction   Latex Itching   Sulfa  Antibiotics Itching      Medication List       Accurate as of 02/08/17  2:10 PM. Always use your most recent med list.          acetaminophen 325 MG tablet Commonly known as:  TYLENOL Take 325 mg by mouth every 6 (six) hours as needed for mild pain or fever (For fever >99.5).   amiodarone 200 MG tablet Commonly known as:  PACERONE Take 1 tablet (200 mg total) by mouth daily.   atorvastatin 10 MG tablet Commonly known as:  LIPITOR Take 10 mg by mouth at bedtime.   CERTAVITE/ANTIOXIDANTS Tabs Take 1 tablet by mouth at bedtime.   famotidine 20 MG tablet Commonly known as:  PEPCID Take 1 tablet (20 mg total) by mouth at bedtime.   FIBER CHOICE PO Take 2 tablets by mouth daily as needed (Constipation).   fluticasone 110 MCG/ACT inhaler Commonly known as:  FLOVENT HFA Inhale 2 puffs into the lungs 2 (two) times daily.   hydrOXYzine 50 MG tablet Commonly known as:  ATARAX/VISTARIL Take 50 mg by mouth every 6 (six) hours as needed for itching.   insulin glargine 100 UNIT/ML injection Commonly known as:  LANTUS Inject 8 Units into the skin every morning.  insulin lispro 100 UNIT/ML injection Commonly known as:  HUMALOG Inject 0-10 Units into the skin 3 (three) times daily as needed for high blood sugar (CBG >180). CBG 0-59 Hypoglycemic Protocol, 60-149 0 units 150-250 5 units, 251-300 8 units, 301-350 10 units, >350 call MD   lactose free nutrition Liqd Take 237 mLs by mouth daily. At 9PM   loperamide 2 MG capsule Commonly known as:  IMODIUM Take 1 capsule (2 mg total) by mouth 4 (four) times daily as needed for diarrhea or loose stools.   metoprolol succinate 25 MG 24 hr tablet Commonly known as:  TOPROL-XL Take 12.5 mg by mouth 2 (two) times daily. HOLD for SBP <110 and HP <60   mirtazapine 15 MG tablet Commonly known as:  REMERON Take 15 mg by mouth at bedtime.   nitroGLYCERIN 0.4 MG SL tablet Commonly known as:  NITROSTAT Place 0.4 mg under the tongue  every 5 (five) minutes as needed for chest pain.   oxyCODONE-acetaminophen 5-325 MG tablet Commonly known as:  PERCOCET/ROXICET Take 1-2 tablets by mouth every 6 (six) hours as needed for moderate pain or severe pain.   OXYGEN Inhale 2 L into the lungs as needed.   PROAIR HFA 108 (90 Base) MCG/ACT inhaler Generic drug:  albuterol Inhale 2 puffs into the lungs every 6 (six) hours as needed for wheezing or shortness of breath.   sertraline 25 MG tablet Commonly known as:  ZOLOFT Take 25 mg by mouth daily.   SYSTANE BALANCE OP Place 1 drop into both eyes 2 (two) times daily.       Immunizations: Immunization History  Administered Date(s) Administered  . Influenza, High Dose Seasonal PF 08/17/2016  . PPD Test 03/01/2016, 04/10/2016  . Pneumococcal-Unspecified 01/16/2017     Physical Exam:  Vitals:   02/08/17 1403  BP: 116/60  Pulse: 64  Resp: 20  Temp: 98.7 F (37.1 C)  TempSrc: Oral  SpO2: 97%  Weight: 160 lb (72.6 kg)  Height: 5\' 8"  (1.727 m)   Body mass index is 24.33 kg/m.  General- elderly female, well built, in no acute distress Head- normocephalic, atraumatic Nose- no nasal discharge Throat- moist mucus membrane Eyes- PERRLA, EOMI, no pallor, no icterus, no discharge, normal conjunctiva, normal sclera Neck- no cervical lymphadenopathy Cardiovascular- normal s1,s2, no murmur Respiratory- bilateral clear to auscultation, no wheeze, no rhonchi, no crackles, no use of accessory muscles Abdomen- bowel sounds present, soft, tenderness over RUQ, no guarding or rigidity, gall bladder drain to her abdomen with dressing clean and dry Musculoskeletal- able to move all 4 extremities, generalized weakness, no leg edema Neurological- alert and oriented to person, place and time Skin- warm and dry, right chest porta cath in place Psychiatry- normal mood and affect    Labs reviewed: Basic Metabolic Panel:  Recent Labs  95/62/13 1423  05/08/16 0436   07/15/16 1800  07/22/16 1109  02/01/17 1924 02/03/17 1600 02/06/17 0700  NA  --   < > 137  < > 135  < > 135  < > 135 138 136  K  --   < > 3.5  < > 4.3  < > 3.5  < > 3.0* 3.7 3.4*  CL  --   < > 103  < > 97*  < > 102  < > 98* 97* 100*  CO2  --   < > 25  < > 25  < > 24  < > 28 27 27   GLUCOSE  --   < >  76  < > 152*  < > 64*  < > 80 86 98  BUN  --   < > 28*  < > 40*  < > 12  < > 18 35* 19  CREATININE  --   < > 6.52*  < > 6.36*  < > 2.74*  < > 2.91* 4.89* 4.70*  CALCIUM  --   < > 8.0*  < > 8.5*  < > 8.2*  < > 7.8* 8.3* 8.2*  MG 2.1  --  1.9  --  2.2  --   --   --   --   --   --   PHOS  --   < >  --   < > 4.7*  < > 1.7*  --   --  5.3* 4.9*  < > = values in this interval not displayed. Liver Function Tests:  Recent Labs  08/26/16 2046 09/16/16 1232 11/16/16 01/19/17 02/01/17 1924 02/03/17 1600 02/06/17 0700  AST 25 17  --  11* 15  --   --   ALT 12* 12*  --  6* 9*  --   --   ALKPHOS 194* 182* 172* 198* 199*  --   --   BILITOT 0.7 0.8  --   --  0.6  --   --   PROT 8.0 8.7*  --   --  8.3*  --   --   ALBUMIN 1.8* 1.9*  --   --  1.8* 1.7* 1.7*    Recent Labs  05/06/16 1240 08/26/16 2046 02/01/17 1924  LIPASE 37 22 19   No results for input(s): AMMONIA in the last 8760 hours. CBC:  Recent Labs  09/26/16  02/01/17 1924 02/03/17 1559 02/04/17 0830 02/06/17 0500  WBC 9.2  < > 10.7* 9.7 8.6 8.8  NEUTROABS 6  --  8.6* 7.4  --   --   HGB 9.4*  < > 9.2* 9.0* 9.4* 8.9*  HCT 31*  < > 30.3* 30.1* 31.8* 30.3*  MCV  --   --  81.7 81.4 81.3 81.7  PLT  --   < > 287 338 296 332  < > = values in this interval not displayed. Cardiac Enzymes: No results for input(s): CKTOTAL, CKMB, CKMBINDEX, TROPONINI in the last 8760 hours. BNP: Invalid input(s): POCBNP CBG:  Recent Labs  02/07/17 1201 02/07/17 1444 02/07/17 1726  GLUCAP 132* 135* 116*    Radiological Exams: Ct Abdomen Pelvis W Contrast  Result Date: 02/01/2017 CLINICAL DATA:  Initial evaluation for right-sided abdominal  pain for 1 month. EXAM: CT ABDOMEN AND PELVIS WITH CONTRAST TECHNIQUE: Multidetector CT imaging of the abdomen and pelvis was performed using the standard protocol following bolus administration of intravenous contrast. CONTRAST:  ISOVUE-300 IOPAMIDOL (ISOVUE-300) INJECTION 61% COMPARISON:  Prior CT from 08/26/2016. FINDINGS: Lower chest: Small layering bilateral pleural effusions with associated bibasilar atelectasis. Cardiomegaly with pacemaker electrodes partially visualized. Hepatobiliary: Few scattered subcentimeter hypodensities noted within the liver, too small the characterize, but may reflect small cyst. Liver otherwise unremarkable. Mucosal edema with mild fuzzy stranding seen about the gallbladder, which may reflect sequelae of acute cholecystitis. Question trace free pericholecystic fluid. No definite internal stones identified. No appreciable biliary dilatation. Pancreas: Pancreas within normal limits. Spleen: Spleen within normal limits. Adrenals/Urinary Tract: Adrenal glands are normal. Kidneys equal in size with symmetric enhancement. Few scattered subcentimeter hypodensities noted within the right kidney, too small the characterize, but statistically likely reflects small cyst. Kidneys otherwise unremarkable  without nephrolithiasis, hydronephrosis, or focal enhancing renal mass. No hydroureter. Bladder largely decompressed. Circumferential bladder wall thickening likely related incomplete distension. Stomach/Bowel: Stomach within normal limits. No evidence for bowel obstruction. Appendix well visualized within the right lower quadrant and is within normal limits without associated inflammatory changes to suggest acute appendicitis. No acute inflammatory changes seen about the bowels. Large amount of impacted stool within the rectal vault, suggesting constipation, similar to previous. Vascular/Lymphatic: Moderate aorto bi-iliac atherosclerotic disease. No aneurysm. No pathologically enlarged  intra-abdominal or pelvic lymph nodes. Reproductive: Small amount of fluid present within the endometrial canal. Uterus otherwise unremarkable. Ovaries within normal limits. Other: No free air or fluid. Fat containing paraumbilical hernia noted. With without closely approximated at the base of the hernia without associated inflammation or obstruction. Musculoskeletal: Mild diffuse anasarca noted, likely related to overall volume status. Progressive height loss with endplate irregularity within the visualized vertebral bodies of the thoracic spine, suspected to be related to dialysis/renal osteodystrophy given patient history. No definite acute osseous abnormality. Bilateral facet arthrosis noted within the lower lumbar spine. No other worrisome lytic or blastic osseous lesions. IMPRESSION: 1. Mild mucosal enhancement with small volume free pericholecystic fluid and subtle hazy stranding about the gallbladder. Findings may reflect sequelae of acute cholecystitis. Correlation were laboratory values recommended. Additionally, this could be further assessed with dedicated right upper quadrant ultrasound as indicated. 2. Large amount of impacted stool within the rectal vault, similar to previous. 3. Cardiomegaly with bilateral pleural effusions and diffuse anasarca. Associated bibasilar atelectasis within the lung bases. 4. 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 Electronically Signed   By: Rise Mu M.D.   On: 02/01/2017 23:28   Ir Perc Cholecystostomy  Result Date: 02/02/2017 INDICATION: 66 year old female with numerous medical problems including congestive heart failure, COPD, and end-stage renal disease. She has acute cholecystitis and is not considered a surgical candidate. She presents for placement of a percutaneous cholecystostomy tube. EXAM: CHOLECYSTOSTOMY MEDICATIONS:  Patient recently received intravenous Zosyn. No additional antibiotic prophylaxis was administered. ANESTHESIA/SEDATION: Moderate (conscious) sedation was employed during this procedure. A total of Versed 1 mg and Fentanyl 100 mcg was administered intravenously. Moderate Sedation Time: 10 minutes. The patient's level of consciousness and vital signs were monitored continuously by radiology nursing throughout the procedure under my direct supervision. FLUOROSCOPY TIME:  Fluoroscopy Time: 0 minutes 54 seconds (3 mGy). COMPLICATIONS: None immediate. PROCEDURE: Informed written consent was obtained from the patient after a thorough discussion of the procedural risks, benefits and alternatives. All questions were addressed. Maximal Sterile Barrier Technique was utilized including caps, mask, sterile gowns, sterile gloves, sterile drape, hand hygiene and skin antiseptic. A timeout was performed prior to the initiation of the procedure. The right upper quadrant was interrogated with ultrasound. The distended gallbladder was successfully identified. The colon was also identified and no was made of its location. Local anesthesia was attained by infiltration with 1% lidocaine. A small dermatotomy was made. Under real-time sonographic guidance, the gallbladder lumen was punctured with a 21 gauge micropuncture needle along a short transhepatic course. Care was taken to avoid the nearby hepatic flexure of the colon. There was return of dark brown cloudy bile through the needle hub. A gentle hand injection of contrast material confirmed opacification of the gallbladder lumen. A 0.018 wire was advanced through the needle and coiled in the gallbladder lumen. The needle was exchanged for the Accustick sheath. The  micro wire was then exchanged for a short Amplatz wire. The skin tract was then dilated to 10 [place] and a Cook 10.2 [place] all-purpose drainage catheter was advanced over the wire and formed within the gallbladder. A  gentle hand injection of contrast material confirms the tube is located within the gallbladder. The cystic duct is occluded. No definite filling defect to suggest acute cholecystitis. The cholecystostomy tube was connected to gravity bag drainage and secured to the skin with 0 Prolene suture. IMPRESSION: 1. Successful placement of a transhepatic percutaneous cholecystostomy tube for acute cholecystitis. No definite gallstones are visualized. PLAN: 1. Maintain drain to gravity drainage and flush daily. 2. Follow-up in interventional radiology drain clinic in 4 weeks. 3. Given the patient's medical history, she is unlikely to ever become a surgical candidate. If her cystic duct regains patency in the future we may be able to work toward removal of the drainage catheter. Signed, Sterling Big, MD [facility] Electronically Signed   By: Malachy Moan M.D.   On: 02/02/2017 17:41    Assessment/Plan  Generalized weakness From deconditioning. Will have her work with physical therapy and occupational therapy team to help with gait training and muscle strengthening exercises.fall precautions. Skin care. Encourage to be out of bed.   Acute cholecystitis s/p gall bladder drain, will need follow up with IR in 2 weeks. Monitor drainage. Monitor for signs of infection. Monitor wbc and temp curve  Loose stool Avoid fatty meal. Continue prn imodium. Check bmp  Hypokalemia Monitor bmp periodically  Anemia of chronic disease Monitor cbc  ESRD On HD 3 days a week, monitor clinically  Dm type 2 with renal disease Lab Results  Component Value Date   HGBA1C 5 12/25/2016   Currently on lantus 8 u daily and SSI humalog. Decrease lantus to 5 u sq daily and monitor cbg.   Chronic respiratory failure With chf and copd. Continue o2 by nasal canula. Continue flovent bid and ventolin prn. Monitor her breathing.continue HD   Goals of care: long  term care.    Labs/tests ordered: cbc, cmp 02/12/17  Family/ staff Communication: reviewed care plan with patient and nursing supervisor    [name], MD Internal Medicine [facility] [address] [place], [place] [address] Cell Phone (Monday-Friday 8 am - 5 pm): [phone] On Call: [phone] and follow prompts after 5 pm and on weekends Office Phone: [phone] Office Fax: [phone]

## 2017-02-08 NOTE — Pre-Procedure Instructions (Signed)
    Brandy Dudley  02/08/2017     Your procedure is scheduled on Monday, March 19.  Report to [facility] Admitting at 9:15 AM                    (Your surgery or procedure is scheduled for 11:50 AM)   Call this number if you have problems the morning of surgery: [phone]     Remember:  Do not eat food or drink liquids after midnight Sunday, March 18.  Take these medicines the morning of surgery with A SIP OF WATER: amiodarone (PACERONE), metoprolol succinate (TOPROL-XL), sertraline (ZOLOFT), May use Flovent.                   Take if needed: Nitroglycerin,hydrOXYzine (ATARAX/VISTARIL),   if needed and patient tolerates on an empty stomach- oxyCODONE-acetaminophen. (PERCOCET/ROXICET).  May use Albuterol if needed.                 AM of surgery, give patient 4 units of Lantus.               If CBG > 220, give patient 1/2 of scheduled SS Insulin Coverage.    If CBG is Less than 70, give patient 4 Glucose tabs or 1 tube of Glucose Gel and recheck CBG in 15 minutes.  Then call pre op desk - [phone]               Please send Medication Record with administered medications documented.

## 2017-02-08 NOTE — Telephone Encounter (Signed)
This is a patient of [facility], who was admitted to [facility] after hospitalization. [facility] F/U is needed. Hospital discharge from [facility] on 02/07/17.

## 2017-02-09 ENCOUNTER — Encounter (HOSPITAL_COMMUNITY): Payer: Self-pay | Admitting: *Deleted

## 2017-02-09 NOTE — Progress Notes (Signed)
I spoke with Ola a nurse on the unit that [name] is on. I instructed her to give 1/2 Lantus dose on Monday if CBG > 70, Ola stated that they can not do that because they do not give Insulin if patient is NPO.  I asked Ola to have CBG checked.  I spoke with Ms Brandy Dudley and informed her of the plan for Monday.

## 2017-02-12 ENCOUNTER — Ambulatory Visit (HOSPITAL_COMMUNITY): Payer: Medicare Other | Admitting: Anesthesiology

## 2017-02-12 ENCOUNTER — Encounter (HOSPITAL_COMMUNITY)
Admission: RE | Disposition: A | Discharge status: Skilled Nursing Facility | Payer: Self-pay | Source: Ambulatory Visit | Attending: Vascular Surgery

## 2017-02-12 ENCOUNTER — Encounter (HOSPITAL_COMMUNITY): Payer: Self-pay | Admitting: *Deleted

## 2017-02-12 ENCOUNTER — Inpatient Hospital Stay (HOSPITAL_COMMUNITY)
Admission: RE | Admit: 2017-02-12 | Discharge: 2017-02-13 | DRG: 252 | Disposition: A | Discharge status: Skilled Nursing Facility | Payer: Medicare Other | Source: Ambulatory Visit | Attending: Vascular Surgery | Admitting: Vascular Surgery

## 2017-02-12 DIAGNOSIS — Z95810 Presence of automatic (implantable) cardiac defibrillator: Secondary | ICD-10-CM | POA: Diagnosis not present

## 2017-02-12 DIAGNOSIS — I132 Hypertensive heart and chronic kidney disease with heart failure and with stage 5 chronic kidney disease, or end stage renal disease: Secondary | ICD-10-CM | POA: Diagnosis present

## 2017-02-12 DIAGNOSIS — Z955 Presence of coronary angioplasty implant and graft: Secondary | ICD-10-CM | POA: Diagnosis not present

## 2017-02-12 DIAGNOSIS — E1122 Type 2 diabetes mellitus with diabetic chronic kidney disease: Secondary | ICD-10-CM | POA: Diagnosis present

## 2017-02-12 DIAGNOSIS — Z992 Dependence on renal dialysis: Secondary | ICD-10-CM

## 2017-02-12 DIAGNOSIS — E11610 Type 2 diabetes mellitus with diabetic neuropathic arthropathy: Secondary | ICD-10-CM | POA: Diagnosis present

## 2017-02-12 DIAGNOSIS — Z86718 Personal history of other venous thrombosis and embolism: Secondary | ICD-10-CM

## 2017-02-12 DIAGNOSIS — I120 Hypertensive chronic kidney disease with stage 5 chronic kidney disease or end stage renal disease: Secondary | ICD-10-CM | POA: Diagnosis present

## 2017-02-12 DIAGNOSIS — I255 Ischemic cardiomyopathy: Secondary | ICD-10-CM | POA: Diagnosis not present

## 2017-02-12 DIAGNOSIS — N186 End stage renal disease: Secondary | ICD-10-CM

## 2017-02-12 DIAGNOSIS — J449 Chronic obstructive pulmonary disease, unspecified: Secondary | ICD-10-CM | POA: Diagnosis present

## 2017-02-12 DIAGNOSIS — I739 Peripheral vascular disease, unspecified: Secondary | ICD-10-CM | POA: Diagnosis present

## 2017-02-12 DIAGNOSIS — G4733 Obstructive sleep apnea (adult) (pediatric): Secondary | ICD-10-CM | POA: Diagnosis present

## 2017-02-12 DIAGNOSIS — D649 Anemia, unspecified: Secondary | ICD-10-CM | POA: Diagnosis present

## 2017-02-12 DIAGNOSIS — Z87891 Personal history of nicotine dependence: Secondary | ICD-10-CM

## 2017-02-12 DIAGNOSIS — I2510 Atherosclerotic heart disease of native coronary artery without angina pectoris: Secondary | ICD-10-CM | POA: Diagnosis not present

## 2017-02-12 DIAGNOSIS — I5022 Chronic systolic (congestive) heart failure: Secondary | ICD-10-CM | POA: Diagnosis not present

## 2017-02-12 HISTORY — PX: AV FISTULA PLACEMENT: SHX1204

## 2017-02-12 HISTORY — DX: Adverse effect of unspecified anesthetic, initial encounter: T41.45XA

## 2017-02-12 HISTORY — DX: Charcot's joint, left ankle and foot: M14.672

## 2017-02-12 LAB — COMPREHENSIVE METABOLIC PANEL
ALK PHOS: 121 U/L (ref 38–126)
ALT: 7 U/L — ABNORMAL LOW (ref 14–54)
ANION GAP: 12 (ref 5–15)
AST: 12 U/L — ABNORMAL LOW (ref 15–41)
Albumin: 1.8 g/dL — ABNORMAL LOW (ref 3.5–5.0)
BILIRUBIN TOTAL: 0.3 mg/dL (ref 0.3–1.2)
BUN: 34 mg/dL — ABNORMAL HIGH (ref 6–20)
CALCIUM: 8.3 mg/dL — AB (ref 8.9–10.3)
CO2: 25 mmol/L (ref 22–32)
Chloride: 102 mmol/L (ref 101–111)
Creatinine, Ser: 4.87 mg/dL — ABNORMAL HIGH (ref 0.44–1.00)
GFR, EST AFRICAN AMERICAN: 10 mL/min — AB (ref >60)
GFR, EST NON AFRICAN AMERICAN: 8 mL/min — AB (ref >60)
Glucose, Bld: 86 mg/dL (ref 65–99)
POTASSIUM: 3.6 mmol/L (ref 3.5–5.1)
Sodium: 139 mmol/L (ref 135–145)
TOTAL PROTEIN: 7.9 g/dL (ref 6.5–8.1)

## 2017-02-12 LAB — CBC
HEMATOCRIT: 31.6 % — AB (ref 36.0–46.0)
Hemoglobin: 9.2 g/dL — ABNORMAL LOW (ref 12.0–15.0)
MCH: 24.3 pg — AB (ref 26.0–34.0)
MCHC: 29.1 g/dL — AB (ref 30.0–36.0)
MCV: 83.4 fL (ref 78.0–100.0)
Platelets: 220 10*3/uL (ref 150–400)
RBC: 3.79 MIL/uL — ABNORMAL LOW (ref 3.87–5.11)
RDW: 17.9 % — AB (ref 11.5–15.5)
WBC: 9.8 10*3/uL (ref 4.0–10.5)

## 2017-02-12 LAB — POCT I-STAT 4, (NA,K, GLUC, HGB,HCT)
Glucose, Bld: 105 mg/dL — ABNORMAL HIGH (ref 65–99)
HCT: 37 % (ref 36.0–46.0)
HEMOGLOBIN: 12.6 g/dL (ref 12.0–15.0)
Potassium: 3.7 mmol/L (ref 3.5–5.1)
Sodium: 139 mmol/L (ref 135–145)

## 2017-02-12 LAB — GLUCOSE, CAPILLARY
GLUCOSE-CAPILLARY: 100 mg/dL — AB (ref 65–99)
GLUCOSE-CAPILLARY: 97 mg/dL (ref 65–99)
GLUCOSE-CAPILLARY: 80 mg/dL (ref 65–99)
Glucose-Capillary: 117 mg/dL — ABNORMAL HIGH (ref 65–99)
Glucose-Capillary: 106 mg/dL — ABNORMAL HIGH (ref 65–99)

## 2017-02-12 SURGERY — INSERTION OF ARTERIOVENOUS (AV) GORE-TEX GRAFT THIGH
Anesthesia: General | Laterality: Left

## 2017-02-12 MED ORDER — BOOST PLUS PO LIQD
237.0000 mL | Freq: Every day | ORAL | Status: DC
  Administered 2017-02-13: 237 mL via ORAL
  Filled 2017-02-12 (×3): qty 237

## 2017-02-12 MED ORDER — AMIODARONE HCL 200 MG PO TABS
200.0000 mg | ORAL_TABLET | Freq: Every day | ORAL | Status: DC
  Administered 2017-02-13: 200 mg via ORAL
  Filled 2017-02-12: qty 1

## 2017-02-12 MED ORDER — SODIUM CHLORIDE 0.9 % IV SOLN: 250.0000 mL | INTRAVENOUS | Status: DC | PRN

## 2017-02-12 MED ORDER — POTASSIUM CHLORIDE CRYS ER 20 MEQ PO TBCR
20.0000 meq | EXTENDED_RELEASE_TABLET | Freq: Once | ORAL | Status: AC
Start: 2017-02-12 — End: 2017-02-12
  Administered 2017-02-12: 40 meq via ORAL
  Filled 2017-02-12: qty 2

## 2017-02-12 MED ORDER — GLYCOPYRROLATE 0.2 MG/ML IJ SOLN
INTRAMUSCULAR | Status: DC | PRN
  Administered 2017-02-12 (×2): 0.2 mg via INTRAVENOUS

## 2017-02-12 MED ORDER — SODIUM CHLORIDE 0.9% FLUSH: 3.0000 mL | INTRAVENOUS | Status: DC | PRN

## 2017-02-12 MED ORDER — PROTAMINE SULFATE 10 MG/ML IV SOLN
INTRAVENOUS | Status: DC | PRN
  Administered 2017-02-12: 25 mg via INTRAVENOUS

## 2017-02-12 MED ORDER — PROTAMINE SULFATE 10 MG/ML IV SOLN
INTRAVENOUS | Status: AC
  Filled 2017-02-12: qty 5

## 2017-02-12 MED ORDER — HEPARIN SODIUM (PORCINE) 1000 UNIT/ML IJ SOLN
INTRAMUSCULAR | Status: AC
  Filled 2017-02-12: qty 1

## 2017-02-12 MED ORDER — OXYCODONE-ACETAMINOPHEN 5-325 MG PO TABS
ORAL_TABLET | ORAL | Status: AC
  Administered 2017-02-12: 2 via ORAL
  Filled 2017-02-12: qty 2

## 2017-02-12 MED ORDER — DEXTROSE 5 % IV SOLN
1.5000 g | Freq: Two times a day (BID) | INTRAVENOUS | Status: DC
  Filled 2017-02-12: qty 1.5

## 2017-02-12 MED ORDER — CHLORHEXIDINE GLUCONATE CLOTH 2 % EX PADS: 6.0000 | MEDICATED_PAD | Freq: Once | CUTANEOUS | Status: DC

## 2017-02-12 MED ORDER — LIDOCAINE HCL (CARDIAC) 20 MG/ML IV SOLN
INTRAVENOUS | Status: DC | PRN
  Administered 2017-02-12: 100 mg via INTRAVENOUS

## 2017-02-12 MED ORDER — ALBUTEROL SULFATE (2.5 MG/3ML) 0.083% IN NEBU: 3.0000 mL | INHALATION_SOLUTION | Freq: Four times a day (QID) | RESPIRATORY_TRACT | Status: DC | PRN

## 2017-02-12 MED ORDER — DEXTROSE 5 % IV SOLN
INTRAVENOUS | Status: AC
  Filled 2017-02-12: qty 1.5

## 2017-02-12 MED ORDER — ONDANSETRON HCL 4 MG/2ML IJ SOLN
INTRAMUSCULAR | Status: AC
  Filled 2017-02-12: qty 2

## 2017-02-12 MED ORDER — PROPOFOL 10 MG/ML IV BOLUS
INTRAVENOUS | Status: DC | PRN
  Administered 2017-02-12: 100 mg via INTRAVENOUS

## 2017-02-12 MED ORDER — HYDROMORPHONE HCL 1 MG/ML IJ SOLN
INTRAMUSCULAR | Status: AC
  Administered 2017-02-12: 0.5 mg via INTRAVENOUS
  Filled 2017-02-12: qty 1

## 2017-02-12 MED ORDER — ENOXAPARIN SODIUM 30 MG/0.3ML ~~LOC~~ SOLN
30.0000 mg | SUBCUTANEOUS | Status: DC
  Administered 2017-02-13: 30 mg via SUBCUTANEOUS
  Filled 2017-02-12: qty 0.3

## 2017-02-12 MED ORDER — EPHEDRINE 5 MG/ML INJ
INTRAVENOUS | Status: AC
  Filled 2017-02-12: qty 10

## 2017-02-12 MED ORDER — SODIUM CHLORIDE 0.9 % IV SOLN
INTRAVENOUS | Status: DC | PRN
  Administered 2017-02-12: 13:00:00

## 2017-02-12 MED ORDER — LIDOCAINE 2% (20 MG/ML) 5 ML SYRINGE
INTRAMUSCULAR | Status: AC
  Filled 2017-02-12: qty 10

## 2017-02-12 MED ORDER — ACETAMINOPHEN 325 MG PO TABS: 325.0000 mg | ORAL_TABLET | Freq: Four times a day (QID) | ORAL | Status: DC | PRN

## 2017-02-12 MED ORDER — HEMOSTATIC AGENTS (NO CHARGE) OPTIME
TOPICAL | Status: DC | PRN
  Administered 2017-02-12: 1

## 2017-02-12 MED ORDER — INSULIN ASPART 100 UNIT/ML ~~LOC~~ SOLN: 0.0000 [IU] | SUBCUTANEOUS | Status: DC

## 2017-02-12 MED ORDER — HYDRALAZINE HCL 20 MG/ML IJ SOLN: 5.0000 mg | INTRAMUSCULAR | Status: DC | PRN

## 2017-02-12 MED ORDER — SERTRALINE HCL 25 MG PO TABS
25.0000 mg | ORAL_TABLET | Freq: Every day | ORAL | Status: DC
  Administered 2017-02-13: 25 mg via ORAL
  Filled 2017-02-12 (×2): qty 1

## 2017-02-12 MED ORDER — POLYVINYL ALCOHOL 1.4 % OP SOLN
1.0000 [drp] | Freq: Two times a day (BID) | OPHTHALMIC | Status: DC
  Filled 2017-02-12 (×2): qty 15

## 2017-02-12 MED ORDER — HYDROMORPHONE HCL 1 MG/ML IJ SOLN
0.2500 mg | INTRAMUSCULAR | Status: DC | PRN
  Administered 2017-02-12 (×4): 0.5 mg via INTRAVENOUS

## 2017-02-12 MED ORDER — METOPROLOL TARTRATE 5 MG/5ML IV SOLN: 2.0000 mg | INTRAVENOUS | Status: DC | PRN

## 2017-02-12 MED ORDER — NITROGLYCERIN 0.4 MG SL SUBL: 0.4000 mg | SUBLINGUAL_TABLET | SUBLINGUAL | Status: DC | PRN

## 2017-02-12 MED ORDER — HYDROMORPHONE HCL 1 MG/ML IJ SOLN
INTRAMUSCULAR | Status: AC
  Filled 2017-02-12: qty 0.5

## 2017-02-12 MED ORDER — ATORVASTATIN CALCIUM 10 MG PO TABS
10.0000 mg | ORAL_TABLET | Freq: Every day | ORAL | Status: DC
  Administered 2017-02-12: 10 mg via ORAL
  Filled 2017-02-12: qty 1

## 2017-02-12 MED ORDER — EPHEDRINE SULFATE 50 MG/ML IJ SOLN
INTRAMUSCULAR | Status: DC | PRN
  Administered 2017-02-12 (×4): 10 mg via INTRAVENOUS

## 2017-02-12 MED ORDER — MIRTAZAPINE 15 MG PO TABS
15.0000 mg | ORAL_TABLET | Freq: Every day | ORAL | Status: DC
  Administered 2017-02-12: 15 mg via ORAL
  Filled 2017-02-12: qty 1

## 2017-02-12 MED ORDER — FENTANYL CITRATE (PF) 100 MCG/2ML IJ SOLN
INTRAMUSCULAR | Status: DC | PRN
  Administered 2017-02-12: 25 ug via INTRAVENOUS

## 2017-02-12 MED ORDER — INSULIN GLARGINE 100 UNIT/ML ~~LOC~~ SOLN
8.0000 [IU] | Freq: Every day | SUBCUTANEOUS | Status: DC
  Administered 2017-02-13: 8 [IU] via SUBCUTANEOUS
  Filled 2017-02-12: qty 0.08

## 2017-02-12 MED ORDER — DEXTROSE 5 % IV SOLN
1.5000 g | INTRAVENOUS | Status: AC
  Administered 2017-02-12: 1.5 g via INTRAVENOUS

## 2017-02-12 MED ORDER — OXYCODONE HCL 5 MG/5ML PO SOLN: 5.0000 mg | Freq: Once | ORAL | Status: DC | PRN

## 2017-02-12 MED ORDER — 0.9 % SODIUM CHLORIDE (POUR BTL) OPTIME
TOPICAL | Status: DC | PRN
  Administered 2017-02-12: 1000 mL

## 2017-02-12 MED ORDER — OXYCODONE-ACETAMINOPHEN 5-325 MG PO TABS
1.0000 | ORAL_TABLET | Freq: Four times a day (QID) | ORAL | Status: DC | PRN
  Administered 2017-02-12: 2 via ORAL
  Administered 2017-02-13: 1 via ORAL
  Administered 2017-02-13: 2 via ORAL
  Filled 2017-02-12: qty 2
  Filled 2017-02-12: qty 1

## 2017-02-12 MED ORDER — PROMETHAZINE HCL 25 MG/ML IJ SOLN: 6.2500 mg | INTRAMUSCULAR | Status: DC | PRN

## 2017-02-12 MED ORDER — SODIUM CHLORIDE 0.9% FLUSH
3.0000 mL | Freq: Two times a day (BID) | INTRAVENOUS | Status: DC
  Administered 2017-02-12: 3 mL via INTRAVENOUS

## 2017-02-12 MED ORDER — PHENOL 1.4 % MT LIQD: 1.0000 | OROMUCOSAL | Status: DC | PRN

## 2017-02-12 MED ORDER — METOPROLOL SUCCINATE 12.5 MG HALF TABLET
12.5000 mg | ORAL_TABLET | Freq: Two times a day (BID) | ORAL | Status: DC
  Administered 2017-02-13: 12.5 mg via ORAL
  Filled 2017-02-12 (×3): qty 1

## 2017-02-12 MED ORDER — HEPARIN SODIUM (PORCINE) 1000 UNIT/ML IJ SOLN
INTRAMUSCULAR | Status: DC | PRN
  Administered 2017-02-12: 7000 [IU] via INTRAVENOUS

## 2017-02-12 MED ORDER — FENTANYL CITRATE (PF) 100 MCG/2ML IJ SOLN
INTRAMUSCULAR | Status: AC
Start: 2017-02-12 — End: 2017-02-12
  Filled 2017-02-12: qty 2

## 2017-02-12 MED ORDER — PHENYLEPHRINE HCL 10 MG/ML IJ SOLN
INTRAMUSCULAR | Status: DC | PRN
  Administered 2017-02-12: 50 ug/min via INTRAVENOUS

## 2017-02-12 MED ORDER — ALUM & MAG HYDROXIDE-SIMETH 200-200-20 MG/5ML PO SUSP: 15.0000 mL | ORAL | Status: DC | PRN

## 2017-02-12 MED ORDER — PANTOPRAZOLE SODIUM 40 MG PO TBEC
40.0000 mg | DELAYED_RELEASE_TABLET | Freq: Every day | ORAL | Status: DC
  Administered 2017-02-13: 40 mg via ORAL
  Filled 2017-02-12: qty 1

## 2017-02-12 MED ORDER — LABETALOL HCL 5 MG/ML IV SOLN: 10.0000 mg | INTRAVENOUS | Status: DC | PRN

## 2017-02-12 MED ORDER — ONDANSETRON HCL 4 MG/2ML IJ SOLN
INTRAMUSCULAR | Status: DC | PRN
  Administered 2017-02-12: 4 mg via INTRAVENOUS

## 2017-02-12 MED ORDER — FAMOTIDINE 20 MG PO TABS
20.0000 mg | ORAL_TABLET | Freq: Every day | ORAL | Status: DC
  Administered 2017-02-12: 20 mg via ORAL
  Filled 2017-02-12: qty 1

## 2017-02-12 MED ORDER — HYDROXYZINE HCL 25 MG PO TABS
50.0000 mg | ORAL_TABLET | Freq: Four times a day (QID) | ORAL | Status: DC | PRN
  Administered 2017-02-12: 50 mg via ORAL
  Filled 2017-02-12 (×2): qty 1

## 2017-02-12 MED ORDER — GUAIFENESIN-DM 100-10 MG/5ML PO SYRP: 15.0000 mL | ORAL_SOLUTION | ORAL | Status: DC | PRN

## 2017-02-12 MED ORDER — HYDROMORPHONE HCL 1 MG/ML IJ SOLN
INTRAMUSCULAR | Status: AC
  Administered 2017-02-12: 0.5 mg via INTRAVENOUS
  Filled 2017-02-12: qty 0.5

## 2017-02-12 MED ORDER — BUDESONIDE 0.25 MG/2ML IN SUSP
0.2500 mg | Freq: Two times a day (BID) | RESPIRATORY_TRACT | Status: DC
  Administered 2017-02-12 – 2017-02-13 (×2): 0.25 mg via RESPIRATORY_TRACT
  Filled 2017-02-12 (×2): qty 2

## 2017-02-12 MED ORDER — ONDANSETRON HCL 4 MG/2ML IJ SOLN
4.0000 mg | Freq: Four times a day (QID) | INTRAMUSCULAR | Status: DC | PRN
  Administered 2017-02-13: 4 mg via INTRAVENOUS
  Filled 2017-02-12: qty 2

## 2017-02-12 MED ORDER — SODIUM CHLORIDE 0.9 % IV SOLN
INTRAVENOUS | Status: DC
  Administered 2017-02-12: 10 mL/h via INTRAVENOUS

## 2017-02-12 MED ORDER — MORPHINE SULFATE (PF) 2 MG/ML IV SOLN: 2.0000 mg | INTRAVENOUS | Status: DC | PRN

## 2017-02-12 MED ORDER — LOPERAMIDE HCL 2 MG PO CAPS: 2.0000 mg | ORAL_CAPSULE | Freq: Four times a day (QID) | ORAL | Status: DC | PRN

## 2017-02-12 MED ORDER — OXYCODONE HCL 5 MG PO TABS: 5.0000 mg | ORAL_TABLET | Freq: Once | ORAL | Status: DC | PRN

## 2017-02-12 SURGICAL SUPPLY — 36 items
ARMBAND PINK RESTRICT EXTREMIT (MISCELLANEOUS) ×6 IMPLANT
CANISTER SUCT 3000ML PPV (MISCELLANEOUS) ×3 IMPLANT
CLIP TI MEDIUM 6 (CLIP) ×3 IMPLANT
CLIP TI WIDE RED SMALL 6 (CLIP) ×3 IMPLANT
DERMABOND ADVANCED (GAUZE/BANDAGES/DRESSINGS) ×2
DERMABOND ADVANCED .7 DNX12 (GAUZE/BANDAGES/DRESSINGS) ×1 IMPLANT
DRAPE INCISE IOBAN 66X45 STRL (DRAPES) ×3 IMPLANT
DRSG TEGADERM 4X4.75 (GAUZE/BANDAGES/DRESSINGS) ×3 IMPLANT
ELECT REM PT RETURN 9FT ADLT (ELECTROSURGICAL) ×3
ELECTRODE REM PT RTRN 9FT ADLT (ELECTROSURGICAL) ×1 IMPLANT
GAUZE XEROFORM 1X8 LF (GAUZE/BANDAGES/DRESSINGS) ×3 IMPLANT
GLOVE BIOGEL PI IND STRL 6.5 (GLOVE) ×1 IMPLANT
GLOVE BIOGEL PI IND STRL 7.0 (GLOVE) ×1 IMPLANT
GLOVE BIOGEL PI INDICATOR 6.5 (GLOVE) ×2
GLOVE BIOGEL PI INDICATOR 7.0 (GLOVE) ×2
GLOVE SURG SS PI 7.0 STRL IVOR (GLOVE) ×3 IMPLANT
GLOVE SURG SS PI 7.5 STRL IVOR (GLOVE) ×3 IMPLANT
GOWN STRL REUS W/ TWL LRG LVL3 (GOWN DISPOSABLE) ×2 IMPLANT
GOWN STRL REUS W/ TWL XL LVL3 (GOWN DISPOSABLE) ×1 IMPLANT
GOWN STRL REUS W/TWL LRG LVL3 (GOWN DISPOSABLE) ×4
GOWN STRL REUS W/TWL XL LVL3 (GOWN DISPOSABLE) ×2
GRAFT GORETEX STRT 4-7X45 (Vascular Products) ×3 IMPLANT
HEMOSTAT SNOW SURGICEL 2X4 (HEMOSTASIS) ×3 IMPLANT
INSERT FOGARTY SM (MISCELLANEOUS) ×3 IMPLANT
KIT BASIN OR (CUSTOM PROCEDURE TRAY) ×3 IMPLANT
KIT ROOM TURNOVER OR (KITS) ×3 IMPLANT
NS IRRIG 1000ML POUR BTL (IV SOLUTION) ×3 IMPLANT
PACK CV ACCESS (CUSTOM PROCEDURE TRAY) ×3 IMPLANT
PAD ARMBOARD 7.5X6 YLW CONV (MISCELLANEOUS) ×6 IMPLANT
SUT GORETEX 6.0 TH-9 30 IN (SUTURE) ×3 IMPLANT
SUT GORETEX CV-6TTC-13 36IN (SUTURE) ×3 IMPLANT
SUT PROLENE 6 0 BV (SUTURE) ×12 IMPLANT
SUT VIC AB 3-0 SH 27 (SUTURE) ×4
SUT VIC AB 3-0 SH 27X BRD (SUTURE) ×2 IMPLANT
UNDERPAD 30X30 (UNDERPADS AND DIAPERS) ×3 IMPLANT
WATER STERILE IRR 1000ML POUR (IV SOLUTION) ×3 IMPLANT

## 2017-02-12 NOTE — Op Note (Signed)
    Patient name: Brandy Dudley MRN: [id number] DOB: 02/25/1951 Sex: female  02/12/2017 Pre-operative Diagnosis: esrd Post-operative diagnosis:  Same Surgeon:  Apolinar Junes C. Randie Heinz, MD Assistant: Lianne Cure, PA Procedure Performed: Left femoral loop av graft with 4-7 stretch graft  Indications:  66 year old female history end-stage renal disease multiple failed upper extremity dialysis access and is now on dialysis via right IJ tunneled catheter. She is indicated for permanent access.  Findings: The right femoral vein was noticeably diseased with likely catheterization in the past. At completion there was strong thrill in the runoff of the graft. There was a stable signal at the anterior tibial artery at the ankle that was monophasic.   Procedure:  The patient was identified in the holding area and taken to the operating room where she is placed supine on the operating table general anesthesia was induced she was sterilely prepped and draped in the left groin usual fashion timeout called. We began with transverse incision in the left groin dissecting down initially onto the left common femoral artery encircled this with vessel loops. We then medially dissecting out the femoral vein identified the saphenofemoral junction. There was considerable scarring around this vein without ever having had surgery before consistent with previous catheterization. This was dissected out far enough we then created a tunnel and tunneled a 47 stretch graft through counter incision anterior thigh. Issue of this time was heparinized. Femoral vein was then clamped with a side-biting: Clamp. The graft was trimmed to size and sewn end-to-side with 6-0 Prolene suture. Prior to releasing the clamp did flush the graft again. And then flushed the vein completed our anastomosis. Then turned attention towards the arterial limb. Graft again was trimmed to size flushed with heparinized saline artery was clamped proximally and distally and  arteriotomy made with 11 blade extended with Potts scissor. The graft was then similarly sewn end-to-side with 6-0 Prolene suture flushed prior to completing the anastomosis. Upon completion we released our clamps we had significant bleeding which required repair stitches at both the toe and heel. We then had signal and our common femoral above and below the graft and in the runoff vein. Satisfied patient was given 25 mg of protamine which she tolerated well. Hemostasis was obtained in the wound both wounds closed in layers with 3-0 Vicryl followed by a 4-0 Monocryl suture. Dermabond was placed above that she tolerated procedure well without immediate consultation. There was a stable monophasic signal at the level of the ankle anterior tibial artery at completion.  Blood loss: 100cc  Ema Hebner C. Randie Heinz, MD [facility] [place] Office: [phone] Pager: [phone]

## 2017-02-12 NOTE — Transfer of Care (Signed)
Immediate Anesthesia Transfer of Care Note  Patient: Brandy Dudley  Procedure(s) Performed: Procedure(s) with comments: INSERTION OF ARTERIOVENOUS (AV) GORE-TEX GRAFT THIGH (Left) - Using Goretex 4-93mm Vascular Graft  Patient Location: PACU  Anesthesia Type:General  Level of Consciousness: awake, alert  and oriented  Airway & Oxygen Therapy: Patient Spontanous Breathing and Patient connected to nasal cannula oxygen  Post-op Assessment: Report given to RN and Post -op Vital signs reviewed and stable  Post vital signs: Reviewed and stable  Last Vitals:  Vitals:   02/12/17 0929  BP: 119/62  Pulse: 62  Resp: 20  Temp: 36.6 C    Last Pain:  Vitals:   02/12/17 0929  TempSrc: Oral      Patients Stated Pain Goal: 2 (02/12/17 0943)  Complications: No apparent anesthesia complications

## 2017-02-12 NOTE — Anesthesia Preprocedure Evaluation (Signed)
Anesthesia Evaluation  Patient identified by MRN, date of birth, ID band Patient awake    Reviewed: Allergy & Precautions, NPO status , Patient's Chart, lab work & pertinent test results  Airway Mallampati: II  TM Distance: >3 FB     Dental   Pulmonary sleep apnea , COPD, former smoker,    breath sounds clear to auscultation       Cardiovascular hypertension, + CAD, + Peripheral Vascular Disease and +CHF  + Cardiac Defibrillator  Rhythm:Regular Rate:Normal     Neuro/Psych    GI/Hepatic negative GI ROS, Neg liver ROS,   Endo/Other  diabetes  Renal/GU Renal disease     Musculoskeletal   Abdominal   Peds  Hematology  (+) anemia ,   Anesthesia Other Findings   Reproductive/Obstetrics                             Anesthesia Physical  Anesthesia Plan  ASA: IV  Anesthesia Plan: General   Post-op Pain Management:    Induction: Intravenous  Airway Management Planned: LMA  Additional Equipment:   Intra-op Plan:   Post-operative Plan:   Informed Consent: I have reviewed the patients History and Physical, chart, labs and discussed the procedure including the risks, benefits and alternatives for the proposed anesthesia with the patient or authorized representative who has indicated his/her understanding and acceptance.   Dental advisory given  Plan Discussed with: CRNA and Anesthesiologist  Anesthesia Plan Comments:         Anesthesia Quick Evaluation

## 2017-02-12 NOTE — Anesthesia Procedure Notes (Signed)
Procedure Name: LMA Insertion Date/Time: 02/12/2017 12:53 PM Performed by: Samara Deist Pre-anesthesia Checklist: Patient identified, Emergency Drugs available, Suction available, Patient being monitored and Timeout performed Patient Re-evaluated:Patient Re-evaluated prior to inductionOxygen Delivery Method: Circle system utilized Preoxygenation: Pre-oxygenation with 100% oxygen Intubation Type: IV induction LMA: LMA inserted LMA Size: 4.0 Number of attempts: 1 Placement Confirmation: positive ETCO2 and breath sounds checked- equal and bilateral Tube secured with: Tape Dental Injury: Teeth and Oropharynx as per pre-operative assessment

## 2017-02-12 NOTE — Progress Notes (Signed)
Patient arrived to unit via hospital bed. Patient is alert and oriented x4. Patient verbalized understanding of how to use call Radke. Bed rails up x2. Second person verification of telemetry monitor after box 18 placed on patient. Patient educated on unit. Secretary called downstairs to get patient a tray.

## 2017-02-12 NOTE — H&P (Signed)
   History and Physical Update  The patient was interviewed and re-examined.  The patient's previous History and Physical has been reviewed and is unchanged from office visit. Plan for left thigh av graft.   Zoriah Pulice C. Randie Heinz, MD [facility] [place] Office: [phone] Pager: [phone]   02/12/2017, 11:18 AM

## 2017-02-12 NOTE — Anesthesia Postprocedure Evaluation (Signed)
Anesthesia Post Note  Patient: Brandy Dudley  Procedure(s) Performed: Procedure(s) (LRB): INSERTION OF ARTERIOVENOUS (AV) GORE-TEX GRAFT THIGH (Left)  Patient location during evaluation: PACU Anesthesia Type: General Level of consciousness: awake Pain management: pain level controlled Vital Signs Assessment: post-procedure vital signs reviewed and stable Respiratory status: spontaneous breathing Cardiovascular status: stable Anesthetic complications: no       Last Vitals:  Vitals:   02/12/17 1530 02/12/17 1545  BP: (!) 84/34 (!) 103/54  Pulse: 70 70  Resp: 11 17  Temp:      Last Pain:  Vitals:   02/12/17 1430  TempSrc:   PainSc: 0-No pain                 [name]

## 2017-02-13 ENCOUNTER — Encounter (HOSPITAL_COMMUNITY): Payer: Self-pay | Admitting: Vascular Surgery

## 2017-02-13 DIAGNOSIS — I120 Hypertensive chronic kidney disease with stage 5 chronic kidney disease or end stage renal disease: Secondary | ICD-10-CM | POA: Diagnosis not present

## 2017-02-13 DIAGNOSIS — I132 Hypertensive heart and chronic kidney disease with heart failure and with stage 5 chronic kidney disease, or end stage renal disease: Secondary | ICD-10-CM | POA: Diagnosis not present

## 2017-02-13 LAB — GLUCOSE, CAPILLARY
GLUCOSE-CAPILLARY: 94 mg/dL (ref 65–99)
Glucose-Capillary: 79 mg/dL (ref 65–99)
Glucose-Capillary: 84 mg/dL (ref 65–99)
Glucose-Capillary: 79 mg/dL (ref 65–99)
Glucose-Capillary: 73 mg/dL (ref 65–99)

## 2017-02-13 MED ORDER — DEXTROSE 5 % IV SOLN
1.5000 g | Freq: Once | INTRAVENOUS | Status: DC
  Filled 2017-02-13: qty 1.5

## 2017-02-13 MED ORDER — OXYCODONE-ACETAMINOPHEN 5-325 MG PO TABS: 1.0000 | ORAL_TABLET | Freq: Four times a day (QID) | ORAL | 0 refills | Status: DC | PRN

## 2017-02-13 NOTE — Progress Notes (Signed)
RN called [name] multiple times to give report. Unit number provided for report.

## 2017-02-13 NOTE — Progress Notes (Signed)
Dr. Arbie Cookey notified about pt's low BP. She is alert, oriented x4.  He said to just watch her for now. Will continue to monitor.

## 2017-02-13 NOTE — Clinical Social Work Note (Signed)
Clinical Social Work Assessment  Patient Details  Name: Brandy Dudley MRN: [id number] Date of Birth: 27-Dec-1950  Date of referral:  02/13/17               Reason for consult:  Facility Placement                Permission sought to share information with:  Family Supports Permission granted to share information::  Yes, Verbal Permission Granted  Name::     Brandy Dudley  Agency::     Relationship::  Sister  Contact Information:  [phone]  Housing/Transportation Living arrangements for the past 2 months:  Skilled Nursing Facility Geologist, engineering) Source of Information:  Patient, Facility, Other (Comment Required) (Chart) Patient Interpreter Needed:  None Criminal Activity/Legal Involvement Pertinent to Current Situation/Hospitalization:  No - Comment as needed Significant Relationships:  Siblings Lives with:  Facility Resident [facility]) Do you feel safe going back to the place where you live?  Yes Need for family participation in patient care:  No (Coment)  Care giving concerns:  Patient expressed no concerns regarding care at skilled facility.  Social Worker assessment / plan:  CSW talked with patient at the bedside regarding discharge disposition. Ms. Fiume plans to return to facility and responded when asked that she has been at [facility] one year- next month.  Patient also indicated that her sister Brandy Dudley can be contacted regarding her return to the facility.   Patient did not dialyze today, her regular dialysis day, but was informed that it has been scheduled for her to have dialysis tomorrow (Wed.) at 12:20 pm.   Employment status:  Disabled (Comment on whether or not currently receiving Disability) Insurance information:  Teacher, English as a foreign language, Medicaid In [place] PT Recommendations:  Not assessed at this time Information / Referral to community resources:  Skilled Nursing Facility (Resource information not requested or needed as from a facility)  Patient/Family's Response to  care:  No concerns expressed regarding care during hospitalization.  Patient/Family's Understanding of and Emotional Response to Diagnosis, Current Treatment, and Prognosis:  Not discussed.  Emotional Assessment Appearance:  Appears stated age Attitude/Demeanor/Rapport:  Other (Appropriate) Affect (typically observed):  Appropriate, Pleasant Orientation:  Oriented to Self, Oriented to Place, Oriented to  Time, Oriented to Situation Alcohol / Substance use:  Tobacco Use, Alcohol Use, Illicit Drugs (Patient reports that she quit smoking 10 years ago and does not drink or use illicit drugs) Psych involvement (Current and /or in the community):  No (Comment)  Discharge Needs  Concerns to be addressed:  No discharge needs identified Readmission within the last 30 days:  Yes Current discharge risk:  None Barriers to Discharge:  No Barriers Identified   Cristobal Goldmann, LCSW 02/13/2017, 5:10 PM

## 2017-02-13 NOTE — Clinical Social Work Note (Addendum)
Patient discharging back to [facility] today, transported by ambulance. Discharge summary transmitted to facility and due to date of last discharge and subsequent admission, CSW advised that FL-2 not needed. Call made to patient's sister, [name] [phone]) and message left.  CSW received call from Ms. Haith at 5:34 and was updated regarding patient's discharge back to facility.  Genelle Bal, MSW, LCSW Licensed Clinical Social Worker Clinical Social Work Department [employer] [phone]

## 2017-02-13 NOTE — Progress Notes (Signed)
Paged Maureen [phone].  Pt needs discharge summary for discharge.

## 2017-02-13 NOTE — Progress Notes (Signed)
PTAR arrived to get pt.  Pt clean.  Informed PTAR of pt's low BP which has been consistent with her stay here.  Also informed them of her drain, which had been emptied and did not need to be emptied again.  Pt in no distress. No IV in place.

## 2017-02-13 NOTE — Discharge Summary (Signed)
Discharge Summary    Brandy Dudley 11/11/1951 66 y.o. female  [id number]  Admission Date: 02/12/2017  Discharge Date: 02/13/17  Physician: Maeola Harman*  Admission Diagnosis: End stage renal disease N18.6   HPI:   This is a 66 y.o. female with history of chronic systolic heart failure status post AICD placement, CAD status post stenting, ESRD on hemodialysis on Tuesdays and Thursdays and Saturday, diabetes mellitus presents to the ER with complaints of persistent abdominal pain mostly postprandial. Denies nausea vomiting or diarrhea. Patient did not go for dialysis on Tuesday because of the pain but did go to the dialysis yesterday. Denies any chest pain or shortness of breath.   ED Course: CT scan in the ER shows features concerning for acute cholecystitis. Dr. Janee Morn on-call general surgeon was consulted and patient being admitted for further management of acute cholecystitis. Patient was started on IV Zosyn.  Hospital Course:  The patient was admitted to the hospital and taken to the operating room on 02/12/2017 and underwent: Left femoral loop AV graft with 4-7 stretch graft.  Intraoperative findings include the following: The right femoral vein was noticeably diseased with likely catheterization in the past. At completion there was strong thrill in the runoff of the graft. There was a stable signal at the anterior tibial artery at the ankle that was monophasic.    The pt tolerated the procedure well and was transported to the PACU in good to fair condition.   By POD 1, she was doing well.  She takes metoprolol 12.5mg  PRN when BP greater than 110 systolic in outpatient setting.    The remainder of the hospital course consisted of increasing mobilization and increasing intake of solids without difficulty.  CBC    Component Value Date/Time   WBC 9.8 02/12/2017 1913   RBC 3.79 (L) 02/12/2017 1913   HGB 9.2 (L) 02/12/2017 1913   HCT 31.6 (L) 02/12/2017 1913   PLT 220 02/12/2017 1913   MCV 83.4 02/12/2017 1913   MCH 24.3 (L) 02/12/2017 1913   MCHC 29.1 (L) 02/12/2017 1913   RDW 17.9 (H) 02/12/2017 1913   LYMPHSABS 1.4 02/03/2017 1559   MONOABS 0.8 02/03/2017 1559   EOSABS 0.1 02/03/2017 1559   BASOSABS 0.0 02/03/2017 1559    BMET    Component Value Date/Time   NA 139 02/12/2017 1913   NA 140 01/19/2017   K 3.6 02/12/2017 1913   CL 102 02/12/2017 1913   CO2 25 02/12/2017 1913   GLUCOSE 86 02/12/2017 1913   BUN 34 (H) 02/12/2017 1913   BUN 30 (A) 01/19/2017   CREATININE 4.87 (H) 02/12/2017 1913   CALCIUM 8.3 (L) 02/12/2017 1913   GFRNONAA 8 (L) 02/12/2017 1913   GFRAA 10 (L) 02/12/2017 1913      Discharge Instructions    Call MD for:  redness, tenderness, or signs of infection (pain, swelling, bleeding, redness, odor or green/yellow discharge around incision site)    Complete by:  As directed    Call MD for:  severe or increased pain, loss or decreased feeling  in affected limb(s)    Complete by:  As directed    Call MD for:  temperature >100.5    Complete by:  As directed    Discharge instructions    Complete by:  As directed    Keep IJ clean and dry, may wash left leg in 48 hours.   Increase activity slowly    Complete by:  As directed    Walk  with assistance use walker or cane as needed   Resume previous diet    Complete by:  As directed       Discharge Diagnosis:  End stage renal disease N18.6  Secondary Diagnosis: Patient Active Problem List   Diagnosis Date Noted  . Acute cholecystitis 02/02/2017  . Preoperative clearance   . Protein-calorie malnutrition, severe 07/18/2016  . Positive blood culture 07/17/2016  . Atherosclerotic peripheral vascular disease (HCC) 07/17/2016  . Bacteremia 07/17/2016  . Depression 07/05/2016  . Umbilical hernia without obstruction or gangrene 07/05/2016  . Generalized anxiety disorder 07/05/2016  . Loss of weight 07/05/2016  . Chronic combined systolic and diastolic CHF  (congestive heart failure) (HCC) 05/07/2016  . 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 Anemia of chronic renal failure, stage 5 (HCC) 04/14/2016  . ESRD on dialysis (HCC)   . History of Clostridium difficile colitis 03/20/2016  . COPD (chronic obstructive pulmonary disease) (HCC) 03/20/2016  . Obstructive sleep apnea 03/20/2016  . AICD (automatic cardioverter/defibrillator) present 03/20/2016  . Physical deconditioning 03/02/2016  . DVT (deep venous thrombosis), right 03/02/2016  . Cardiomyopathy, ischemic 03/02/2016  . Coronary artery disease involving native coronary artery of native heart without angina pectoris 03/02/2016  . Diabetes mellitus type 2 in obese (HCC) 03/02/2016   Past Medical History:  Diagnosis Date  . AICD (automatic cardioverter/defibrillator) present    Biotronik Inventra 7 VR-T DX 04/12/15  . Anemia   . Anxiety   . C. difficile colitis   . Cardiomyopathy (HCC)    02/02/16 ([facility]): Mixed ischemic and non-ischemic cardiomyopathy  . Charcot's joint of left foot   . CHF (congestive heart failure) (HCC)    systolic  . Cholelithiases 01/2017  . Complication of anesthesia 02/02/2017   has anxeity when awaken with gallbladder surgery  . COPD (chronic obstructive pulmonary disease) (HCC)   . Coronary artery disease   . Diabetes mellitus (HCC)    Type II  . DVT (deep venous thrombosis) (HCC)    on coumadin  . ESRD (end stage renal disease) (HCC)    tues/thurs/sat dialysis  . Hypertension   . Neuropathy (HCC)   . Obstructive sleep apnea    no cpap  . Renal disorder   . Wears glasses      Allergies as of 02/13/2017      Reactions   Entresto [sacubitril-valsartan] Other (See Comments)    unknown reaction   Latex Itching   Sulfa Antibiotics Itching      Medication List    TAKE these medications   acetaminophen 325 MG tablet Commonly known as:  TYLENOL Take 325 mg by mouth every 6 (six) hours as needed for mild  pain or fever (For fever >99.5).   amiodarone 200 MG tablet Commonly known as:  PACERONE Take 1 tablet (200 mg total) by mouth daily.   atorvastatin 10 MG tablet Commonly known as:  LIPITOR Take 10 mg by mouth at bedtime.   CERTAVITE/ANTIOXIDANTS Tabs Take 1 tablet by mouth at bedtime.   famotidine 20 MG tablet Commonly known as:  PEPCID Take 1 tablet (20 mg total) by mouth at bedtime.   FIBER CHOICE PO Take 2 tablets by mouth daily as needed (Constipation).   fluticasone 110 MCG/ACT inhaler Commonly known as:  FLOVENT HFA Inhale 2 puffs into the lungs 2 (two) times daily.   hydrOXYzine 50 MG tablet Commonly known as:  ATARAX/VISTARIL Take 50 mg by mouth every 6 (six) hours as needed  for itching.   insulin glargine 100 UNIT/ML injection Commonly known as:  LANTUS Inject 8 Units into the skin every morning.   insulin lispro 100 UNIT/ML injection Commonly known as:  HUMALOG Inject 0-10 Units into the skin 3 (three) times daily as needed for high blood sugar (CBG >180). CBG 0-59 Hypoglycemic Protocol, 60-149 0 units 150-250 5 units, 251-300 8 units, 301-350 10 units, >350 call MD   lactose free nutrition Liqd Take 237 mLs by mouth daily. At 9PM   loperamide 2 MG capsule Commonly known as:  IMODIUM Take 1 capsule (2 mg total) by mouth 4 (four) times daily as needed for diarrhea or loose stools.   metoprolol succinate 25 MG 24 hr tablet Commonly known as:  TOPROL-XL Take 12.5 mg by mouth 2 (two) times daily. HOLD for SBP <110 and HP <60   mirtazapine 15 MG tablet Commonly known as:  REMERON Take 15 mg by mouth at bedtime.   nitroGLYCERIN 0.4 MG SL tablet Commonly known as:  NITROSTAT Place 0.4 mg under the tongue every 5 (five) minutes as needed for chest pain.   oxyCODONE-acetaminophen 5-325 MG tablet Commonly known as:  PERCOCET/ROXICET Take 1-2 tablets by mouth every 6 (six) hours as needed for moderate pain or severe pain. What changed:  Another medication  with the same name was added. Make sure you understand how and when to take each.   oxyCODONE-acetaminophen 5-325 MG tablet Commonly known as:  PERCOCET/ROXICET Take 1 tablet by mouth every 6 (six) hours as needed for moderate pain or severe pain. What changed:  You were already taking a medication with the same name, and this prescription was added. Make sure you understand how and when to take each.   OXYGEN Inhale 2 L into the lungs as needed.   PROAIR HFA 108 (90 Base) MCG/ACT inhaler Generic drug:  albuterol Inhale 2 puffs into the lungs every 6 (six) hours as needed for wheezing or shortness of breath.   sertraline 25 MG tablet Commonly known as:  ZOLOFT Take 25 mg by mouth daily.   SYSTANE BALANCE OP Place 1 drop into both eyes 2 (two) times daily.       Prescriptions given:  Roxicet#12 No Refill  Instructions: 1.  No driving for 2 weeks and while taking pain medication 2.  No heavy lifting x 2 weeks 3.  Wash the groin wound with soap and water daily and pat dry. (No tub bath-only shower)  Then put a dry gauze or washcloth there to keep this area dry daily and as needed.  Do not use Vaseline or neosporin on your incisions.  Only use soap and water on your incisions and then protect and keep dry.   Disposition: SNF  Patient's condition: is Good  Follow up: 1. [facility] as needed.   Doreatha Massed, PA-C [facility] [phone] 02/13/2017  2:36 PM

## 2017-02-13 NOTE — Progress Notes (Addendum)
[facility] [place]  Subjective  - Doing well over all.  We are waiting on transportation to her SNF [facility].  Plan on HD at her normal center.   Objective (!) 88/46 72 98.3 F (36.8 C) (Oral) 18 90%  Intake/Output Summary (Last 24 hours) at 02/13/17 0717 Last data filed at 02/13/17 0400  Gross per 24 hour  Intake              300 ml  Output              295 ml  Net                5 ml    Left thigh graft with palpable thrill Left DP Palpable pulse 2+ Right IJ in place  Assessment/Planning: POD # 1 Left femoral loop av graft with 4-7 stretch graft BP normal range 80's -100's HR 60-72 Takes metoprolol 12.5 PRN when BP in greater than 110 in out patient setting Pending transportation today Social work consult placed to help with return to SNF    [place], [facility] 02/13/2017 7:17 AM --  Laboratory Lab Results:  Recent Labs  02/12/17 1023 02/12/17 1913  WBC  --  9.8  HGB 12.6 9.2*  HCT 37.0 31.6*  PLT  --  220   BMET  Recent Labs  02/12/17 1023 02/12/17 1913  NA 139 139  K 3.7 3.6  CL  --  102  CO2  --  25  GLUCOSE 105* 86  BUN  --  34*  CREATININE  --  4.87*  CALCIUM  --  8.3*    COAG Lab Results  Component Value Date   INR 1.43 02/02/2017   INR 1.85 07/15/2016   INR 2.62 (H) 05/08/2016   No results found for: PTT  I have independently interviewed patient and agree with PA assessment and plan above. Ok for discharge today.   Brandon C. Randie Heinz, MD [facility] [place] Office: [phone] Pager: [phone]

## 2017-02-14 ENCOUNTER — Telehealth: Payer: Self-pay

## 2017-02-14 NOTE — Telephone Encounter (Signed)
This is a patient of [facility], who was admitted to [facility] after hospitalization. [facility] - Hospital F/U is needed. Hospital discharge from [facility] on 02/13/17.

## 2017-02-15 ENCOUNTER — Encounter: Payer: Self-pay | Admitting: Internal Medicine

## 2017-02-15 ENCOUNTER — Non-Acute Institutional Stay (SKILLED_NURSING_FACILITY): Payer: Medicare PPO | Admitting: Internal Medicine

## 2017-02-15 DIAGNOSIS — N186 End stage renal disease: Secondary | ICD-10-CM | POA: Diagnosis not present

## 2017-02-15 DIAGNOSIS — E1122 Type 2 diabetes mellitus with diabetic chronic kidney disease: Secondary | ICD-10-CM

## 2017-02-15 DIAGNOSIS — E440 Moderate protein-calorie malnutrition: Secondary | ICD-10-CM

## 2017-02-15 DIAGNOSIS — Z95828 Presence of other vascular implants and grafts: Secondary | ICD-10-CM | POA: Diagnosis not present

## 2017-02-15 DIAGNOSIS — R531 Weakness: Secondary | ICD-10-CM

## 2017-02-15 DIAGNOSIS — K810 Acute cholecystitis: Secondary | ICD-10-CM

## 2017-02-15 DIAGNOSIS — Z794 Long term (current) use of insulin: Secondary | ICD-10-CM | POA: Diagnosis not present

## 2017-02-15 DIAGNOSIS — Z992 Dependence on renal dialysis: Secondary | ICD-10-CM

## 2017-02-15 NOTE — Progress Notes (Signed)
LOCATION: [facility]  PCP: [name], MD   Code Status: Full Code  Goals of care: Advanced Directive information Advanced Directives 02/12/2017  Does Patient Have a Medical Advance Directive? No  Type of Advance Directive -  Does patient want to make changes to medical advance directive? No - Patient declined  Copy of Healthcare Power of Attorney in Chart? -  Would patient like information on creating a medical advance directive? No - Patient declined       Extended Emergency Contact Information Primary Emergency Contact: Haith,[name] Address: [address]          [place], [place] [address] [place] Home Phone: [phone] Relation: Sister Secondary Emergency Contact: Marcello Moores, [place] Home Phone: [phone] Relation: Sister   Allergies  Allergen Reactions  . Entresto [Sacubitril-Valsartan] Other (See Comments)     unknown reaction  . Latex Itching  . Sulfa Antibiotics Itching    Chief Complaint  Patient presents with  . Readmit To SNF    Readmission Visit      HPI:  Patient is a 66 y.o. female seen today for  long term care post hospital admission from 02/12/17-02/13/17 for left femoral loop AV graft with 4-7 stretch graft for permanent dialysis access. Prior to this, She was getting dialysis from right IJ tunneled catheter. She has failed multiple times for upper extremity dialysis access. She tolerated the procedure well and at the completion of the procedure, there was a strong thrill and a runoff of the graft. She is seen in her room today. Of note she was recently in the hospital with acute abdominal pain from acute cholecystitis. At present she is status post IR guided cholecystostomy tube placement for biliary drain. She has PMH of chronic systolic CHF, S/p AICD, CAD s/p stent, ESRD on HD, DM type 2 among others.    Review of Systems:  Constitutional: Negative for fever, chills.  HENT:  Negative for headache, congestion, nasal discharge Eyes: Negative for double vision and discharge.  Respiratory: Negative for cough, shortness of breath Cardiovascular: Negative for chest pain, palpitation.  Gastrointestinal: Negative for vomiting,loss of appetite, melena, diarrhea and constipation. Positive for nausea and occasional heartburn. She had a bowel movement this morning.  Genitourinary: Negative for flank pain.  Musculoskeletal: Negative for fall in the facility.  Skin: Negative for itching, rash.    Past Medical History:  Diagnosis Date  . AICD (automatic cardioverter/defibrillator) present    Biotronik Inventra 7 VR-T DX 04/12/15  . Anemia   . Anxiety   . C. difficile colitis   . Cardiomyopathy (HCC)    02/02/16 ([facility]): Mixed ischemic and non-ischemic cardiomyopathy  . Charcot's joint of left foot   . CHF (congestive heart failure) (HCC)    systolic  . Cholelithiases 01/2017  . Complication of anesthesia 02/02/2017   has anxeity when awaken with gallbladder surgery  . COPD (chronic obstructive pulmonary disease) (HCC)   . Coronary artery disease   . Diabetes mellitus (HCC)    Type II  . DVT (deep venous thrombosis) (HCC)    on coumadin  . ESRD (end stage renal disease) (HCC)    tues/thurs/sat dialysis  . Hypertension   . Neuropathy (HCC)   . Obstructive sleep apnea    no cpap  . Renal disorder   . Wears glasses    Past Surgical History:  Procedure Laterality Date  . AV  FISTULA PLACEMENT Left 04/03/2016   Procedure: ARTERIOVENOUS (AV) FISTULA CREATION;  Surgeon: Larina Earthly, MD;  Location[facility] OR;  Service: Vascular;  Laterality: Left;  . AV FISTULA PLACEMENT Right 08/21/2016   Procedure: INSERTION RIGHT ARM ARTERIOVENOUS GRAFT USING 4-7MM X45  CM ACUSEAL GRAFT;  Surgeon: Maeola Harman, MD;  Location: [facility] OR;  Service: Vascular;  Laterality: Right;  . AV FISTULA PLACEMENT Left 02/12/2017   Procedure: INSERTION OF ARTERIOVENOUS (AV) GORE-TEX  GRAFT THIGH;  Surgeon: Maeola Harman, MD;  Location: [facility] OR;  Service: Vascular;  Laterality: Left;  Using Goretex 4-23mm Vascular Graft  . CORONARY ANGIOPLASTY     LCX stent 2010; by [facility] 01/2016 note: 03/2014 LHC/RHC: minor luminal irregularities LAD and RCA with patent stent first marginal branch of CX. RA 11, PAP 58/22 with mean 39, mean wedge pressure 19, LVEDP 27, PVR 3.9, CO 5.1, Cardiac index 2.5.   . FISTULA SUPERFICIALIZATION Left 06/16/2016   Procedure: SUPERFICIALIZATION OF LEFT ARM BRACHIOCEPHALIC ARTERIOVENOUS FISTULA;  Surgeon: Chuck Hint, MD;  Location: [facility] OR;  Service: Vascular;  Laterality: Left;  . INSERTION OF DIALYSIS CATHETER Left 04/03/2016   Procedure: INSERTION OF DIALYSIS CATHETER;  Surgeon: Larina Earthly, MD;  Location[facility] OR;  Service: Vascular;  Laterality: Left;  . IR GENERIC HISTORICAL  02/02/2017   IR PERC CHOLECYSTOSTOMY 02/02/2017 Malachy Moan, MD [facility]-INTER[facility] RAD  . LIGATION OF COMPETING BRANCHES OF ARTERIOVENOUS FISTULA Left 06/16/2016   Procedure: LIGATION OF COMPETING BRANCHES OF left brachiocephalic ARTERIOVENOUS FISTULA;  Surgeon: Chuck Hint, MD;  Location: [facility] OR;  Service: Vascular;  Laterality: Left;  . OTHER SURGICAL HISTORY  02/02/2017   Transhepatic percutaneous cholecystostomy placement  . PERIPHERAL VASCULAR CATHETERIZATION N/A 07/20/2016   Procedure: A/V Shuntogram/Fistulagram;  Surgeon: Fransisco Hertz, MD;  Location: [facility] INVASIVE CV LAB;  Service: Cardiovascular;  Laterality: N/A;  . PERIPHERAL VASCULAR CATHETERIZATION N/A 07/20/2016   Procedure: Dialysis/Perma Catheter Insertion;  Surgeon: Fransisco Hertz, MD;  Location: [facility] INVASIVE CV LAB;  Service: Cardiovascular;  Laterality: N/A;  . REVISION OF ARTERIOVENOUS GORETEX GRAFT Left 06/16/2016   Procedure: REVISION OF LEFT BRACHIOCEPHALIC ARTERIOVENOUS FISTULA WITH RESECTION OF REDUNDANT SECTION;  Surgeon: Chuck Hint, MD;  Location: [facility] OR;  Service: Vascular;   Laterality: Left;   Social History:   reports that she quit smoking about 10 years ago. Her smoking use included Cigarettes. She has never used smokeless tobacco. She reports that she does not drink alcohol or use drugs.  No family history on file.  Medications: Allergies as of 02/15/2017      Reactions   Entresto [sacubitril-valsartan] Other (See Comments)    unknown reaction   Latex Itching   Sulfa Antibiotics Itching      Medication List       Accurate as of 02/15/17 11:59 AM. Always use your most recent med list.          acetaminophen 325 MG tablet Commonly known as:  TYLENOL Take 325 mg by mouth every 6 (six) hours as needed for mild pain or fever (For fever >99.5). Take 650 mg by mouth q6h prn for mild pain   amiodarone 200 MG tablet Commonly known as:  PACERONE Take 1 tablet (200 mg total) by mouth daily.   atorvastatin 10 MG tablet Commonly known as:  LIPITOR Take 10 mg by mouth at bedtime.   CERTAVITE/ANTIOXIDANTS Tabs Take 1 tablet by mouth at bedtime.   famotidine 20 MG tablet Commonly known as:  PEPCID Take 1 tablet (20 mg total) by mouth at bedtime.   FIBER CHOICE PO Take 2 tablets by mouth daily as needed (Constipation).   fluticasone 110 MCG/ACT inhaler Commonly known as:  FLOVENT HFA Inhale 2 puffs into the lungs 2 (two) times daily.   hydrOXYzine 50 MG tablet Commonly known as:  ATARAX/VISTARIL Take 50 mg by mouth every 6 (six) hours as needed for itching.   insulin glargine 100 UNIT/ML injection Commonly known as:  LANTUS Inject 8 Units into the skin every morning.   insulin lispro 100 UNIT/ML injection Commonly known as:  HUMALOG Inject 0-10 Units into the skin 3 (three) times daily as needed for high blood sugar (CBG >180). CBG 0-59 Hypoglycemic Protocol, 60-149 0 units 150-250 5 units, 251-300 8 units, 301-350 10 units, >350 call MD   lactose free nutrition Liqd Take 237 mLs by mouth daily. At 9PM   loperamide 2 MG capsule Commonly  known as:  IMODIUM Take 1 capsule (2 mg total) by mouth 4 (four) times daily as needed for diarrhea or loose stools.   metoprolol succinate 25 MG 24 hr tablet Commonly known as:  TOPROL-XL Take 12.5 mg by mouth 2 (two) times daily. HOLD for SBP <110 and HP <60   mirtazapine 15 MG tablet Commonly known as:  REMERON Take 15 mg by mouth at bedtime.   nitroGLYCERIN 0.4 MG SL tablet Commonly known as:  NITROSTAT Place 0.4 mg under the tongue every 5 (five) minutes as needed for chest pain.   oxyCODONE-acetaminophen 5-325 MG tablet Commonly known as:  PERCOCET/ROXICET Take 1-2 tablets by mouth every 6 (six) hours as needed for moderate pain or severe pain.   OXYGEN Inhale 2 L into the lungs as needed.   PROAIR HFA 108 (90 Base) MCG/ACT inhaler Generic drug:  albuterol Inhale 2 puffs into the lungs every 6 (six) hours as needed for wheezing or shortness of breath.   sertraline 25 MG tablet Commonly known as:  ZOLOFT Take 25 mg by mouth daily.   SYSTANE BALANCE OP Place 1 drop into both eyes 2 (two) times daily.       Immunizations: Immunization History  Administered Date(s) Administered  . Influenza, High Dose Seasonal PF 08/17/2016  . PPD Test 03/01/2016, 04/10/2016  . Pneumococcal-Unspecified 01/16/2017     Physical Exam: Vitals:   02/15/17 1041  BP: (!) 101/59  Pulse: 70  Resp: 20  Temp: 98.4 F (36.9 C)  TempSrc: Oral  SpO2: 95%  Weight: 164 lb (74.4 kg)  Height: 5\' 8"  (1.727 m)   Body mass index is 24.94 kg/m.  General- elderly female, chronically ill appearing, in no acute distress Head- normocephalic, atraumatic Nose- no nasal discharge Throat- moist mucus membrane Eyes- PERRLA, EOMI, no pallor, no icterus, no discharge Neck- no cervical lymphadenopathy Cardiovascular- normal s1,s2, no murmur Respiratory- bilateral clear to auscultation, no wheeze, no rhonchi, no crackles, no use of accessory muscles Abdomen- bowel sounds present, soft, tenderness  over RUQ, no guarding or rigidity, gall bladder drain to her abdomen with dressing clean and dry Musculoskeletal- able to move all 4 extremities, generalized weakness, no leg edema, left thigh AV graft good thrill, left palpable distal pulses Neurological- alert and oriented to person, place and time Skin- warm and dry, right IJ tunneled catheter Psychiatry- normal mood and affect   Labs reviewed: Basic Metabolic Panel:  Recent Labs  16/10/96 1423  05/08/16 0436  07/15/16 1800  07/22/16 1109  02/03/17 1600 02/06/17 0700 02/12/17 1023 02/12/17  1913  NA  --   < > 137  < > 135  < > 135  < > 138 136 139 139  K  --   < > 3.5  < > 4.3  < > 3.5  < > 3.7 3.4* 3.7 3.6  CL  --   < > 103  < > 97*  < > 102  < > 97* 100*  --  102  CO2  --   < > 25  < > 25  < > 24  < > 27 27  --  25  GLUCOSE  --   < > 76  < > 152*  < > 64*  < > 86 98 105* 86  BUN  --   < > 28*  < > 40*  < > 12  < > 35* 19  --  34*  CREATININE  --   < > 6.52*  < > 6.36*  < > 2.74*  < > 4.89* 4.70*  --  4.87*  CALCIUM  --   < > 8.0*  < > 8.5*  < > 8.2*  < > 8.3* 8.2*  --  8.3*  MG 2.1  --  1.9  --  2.2  --   --   --   --   --   --   --   PHOS  --   < >  --   < > 4.7*  < > 1.7*  --  5.3* 4.9*  --   --   < > = values in this interval not displayed. Liver Function Tests:  Recent Labs  09/16/16 1232  01/19/17 02/01/17 1924 02/03/17 1600 02/06/17 0700 02/12/17 1913  AST 17  --  11* 15  --   --  12*  ALT 12*  --  6* 9*  --   --  7*  ALKPHOS 182*  < > 198* 199*  --   --  121  BILITOT 0.8  --   --  0.6  --   --  0.3  PROT 8.7*  --   --  8.3*  --   --  7.9  ALBUMIN 1.9*  --   --  1.8* 1.7* 1.7* 1.8*  < > = values in this interval not displayed.  Recent Labs  05/06/16 1240 08/26/16 2046 02/01/17 1924  LIPASE 37 22 19   No results for input(s): AMMONIA in the last 8760 hours. CBC:  Recent Labs  09/26/16  02/01/17 1924 02/03/17 1559 02/04/17 0830 02/06/17 0500 02/12/17 1023 02/12/17 1913  WBC 9.2  < > 10.7* 9.7  8.6 8.8  --  9.8  NEUTROABS 6  --  8.6* 7.4  --   --   --   --   HGB 9.4*  < > 9.2* 9.0* 9.4* 8.9* 12.6 9.2*  HCT 31*  < > 30.3* 30.1* 31.8* 30.3* 37.0 31.6*  MCV  --   --  81.7 81.4 81.3 81.7  --  83.4  PLT  --   < > 287 338 296 332  --  220  < > = values in this interval not displayed. Cardiac Enzymes: No results for input(s): CKTOTAL, CKMB, CKMBINDEX, TROPONINI in the last 8760 hours. BNP: Invalid input(s): POCBNP CBG:  Recent Labs  02/13/17 0731 02/13/17 1205 02/13/17 1700  GLUCAP 84 79 73    Radiological Exams: Ct Abdomen Pelvis W Contrast  Result Date: 02/01/2017 CLINICAL DATA:  Initial evaluation for right-sided abdominal  pain for 1 month. EXAM: CT ABDOMEN AND PELVIS WITH CONTRAST TECHNIQUE: Multidetector CT imaging of the abdomen and pelvis was performed using the standard protocol following bolus administration of intravenous contrast. CONTRAST:  ISOVUE-300 IOPAMIDOL (ISOVUE-300) INJECTION 61% COMPARISON:  Prior CT from 08/26/2016. FINDINGS: Lower chest: Small layering bilateral pleural effusions with associated bibasilar atelectasis. Cardiomegaly with pacemaker electrodes partially visualized. Hepatobiliary: Few scattered subcentimeter hypodensities noted within the liver, too small the characterize, but may reflect small cyst. Liver otherwise unremarkable. Mucosal edema with mild fuzzy stranding seen about the gallbladder, which may reflect sequelae of acute cholecystitis. Question trace free pericholecystic fluid. No definite internal stones identified. No appreciable biliary dilatation. Pancreas: Pancreas within normal limits. Spleen: Spleen within normal limits. Adrenals/Urinary Tract: Adrenal glands are normal. Kidneys equal in size with symmetric enhancement. Few scattered subcentimeter hypodensities noted within the right kidney, too small the characterize, but statistically likely reflects small cyst. Kidneys otherwise unremarkable without nephrolithiasis,  hydronephrosis, or focal enhancing renal mass. No hydroureter. Bladder largely decompressed. Circumferential bladder wall thickening likely related incomplete distension. Stomach/Bowel: Stomach within normal limits. No evidence for bowel obstruction. Appendix well visualized within the right lower quadrant and is within normal limits without associated inflammatory changes to suggest acute appendicitis. No acute inflammatory changes seen about the bowels. Large amount of impacted stool within the rectal vault, suggesting constipation, similar to previous. Vascular/Lymphatic: Moderate aorto bi-iliac atherosclerotic disease. No aneurysm. No pathologically enlarged intra-abdominal or pelvic lymph nodes. Reproductive: Small amount of fluid present within the endometrial canal. Uterus otherwise unremarkable. Ovaries within normal limits. Other: No free air or fluid. Fat containing paraumbilical hernia noted. With without closely approximated at the base of the hernia without associated inflammation or obstruction. Musculoskeletal: Mild diffuse anasarca noted, likely related to overall volume status. Progressive height loss with endplate irregularity within the visualized vertebral bodies of the thoracic spine, suspected to be related to dialysis/renal osteodystrophy given patient history. No definite acute osseous abnormality. Bilateral facet arthrosis noted within the lower lumbar spine. No other worrisome lytic or blastic osseous lesions. IMPRESSION: 1. Mild mucosal enhancement with small volume free pericholecystic fluid and subtle hazy stranding about the gallbladder. Findings may reflect sequelae of acute cholecystitis. Correlation were laboratory values recommended. Additionally, this could be further assessed with dedicated right upper quadrant ultrasound as indicated. 2. Large amount of impacted stool within the rectal vault, similar to previous. 3. Cardiomegaly with bilateral pleural effusions and diffuse  anasarca. Associated bibasilar atelectasis within the lung bases. 4. 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 Electronically Signed   By: Rise Mu M.D.   On: 02/01/2017 23:28   Ir Perc Cholecystostomy  Result Date: 02/02/2017 INDICATION: 66 year old female with numerous medical problems including congestive heart failure, COPD, and end-stage renal disease. She has acute cholecystitis and is not considered a surgical candidate. She presents for placement of a percutaneous cholecystostomy tube. EXAM: CHOLECYSTOSTOMY MEDICATIONS: Patient recently received intravenous Zosyn. No additional antibiotic prophylaxis was administered. ANESTHESIA/SEDATION: Moderate (conscious) sedation was employed during this procedure. A total of Versed 1 mg and Fentanyl 100 mcg was administered intravenously. Moderate Sedation Time: 10 minutes. The patient's level of consciousness and vital signs were monitored continuously by radiology nursing throughout the procedure under my direct supervision. FLUOROSCOPY TIME:  Fluoroscopy Time: 0 minutes 54 seconds (3 mGy). COMPLICATIONS: None immediate. PROCEDURE: Informed written consent was obtained from the patient after a thorough discussion of  the procedural risks, benefits and alternatives. All questions were addressed. Maximal Sterile Barrier Technique was utilized including caps, mask, sterile gowns, sterile gloves, sterile drape, hand hygiene and skin antiseptic. A timeout was performed prior to the initiation of the procedure. The right upper quadrant was interrogated with ultrasound. The distended gallbladder was successfully identified. The colon was also identified and no was made of its location. Local anesthesia was attained by infiltration with 1% lidocaine. A small dermatotomy was made. Under real-time sonographic guidance, the gallbladder  lumen was punctured with a 21 gauge micropuncture needle along a short transhepatic course. Care was taken to avoid the nearby hepatic flexure of the colon. There was return of dark brown cloudy bile through the needle hub. A gentle hand injection of contrast material confirmed opacification of the gallbladder lumen. A 0.018 wire was advanced through the needle and coiled in the gallbladder lumen. The needle was exchanged for the Accustick sheath. The micro wire was then exchanged for a short Amplatz wire. The skin tract was then dilated to 10 [place] and a Cook 10.2 [place] all-purpose drainage catheter was advanced over the wire and formed within the gallbladder. A gentle hand injection of contrast material confirms the tube is located within the gallbladder. The cystic duct is occluded. No definite filling defect to suggest acute cholecystitis. The cholecystostomy tube was connected to gravity bag drainage and secured to the skin with 0 Prolene suture. IMPRESSION: 1. Successful placement of a transhepatic percutaneous cholecystostomy tube for acute cholecystitis. No definite gallstones are visualized. PLAN: 1. Maintain drain to gravity drainage and flush daily. 2. Follow-up in interventional radiology drain clinic in 4 weeks. 3. Given the patient's medical history, she is unlikely to ever become a surgical candidate. If her cystic duct regains patency in the future we may be able to work toward removal of the drainage catheter. Signed, Sterling Big, MD [facility] Radiology Electronically Signed   By: Malachy Moan M.D.   On: 02/02/2017 17:41    Assessment/Plan  End-stage renal disease on dialysis Continue dialysis 3 days a week. Monitor clinically. Continue to use right IJ tunneled catheter for dialysis for now until left femoral AV graft is ready to be used.  Left femoral AV graft Good thrill and has dressing in place. Monitor for signs of  infection  Protein calorie malnutrition Continue remeron to help stimulate appetite. RD consult.   Generalized weakness From deconditioning. Will have her work with physical therapy and occupational therapy team to help with gait training and muscle strengthening exercises.fall precautions. Skin care. Encourage to be out of bed.   Acute cholecystitis s/p gall bladder drain, will need follow up with IR in 2 weeks. Monitor drainage. Monitor for signs of infection. Normal WBC on review.   Dm type 2 with renal disease Lab Results  Component Value Date   HGBA1C 5 12/25/2016   Currently on lantus 8 u daily and SSI humalog. Check a1c and decrease lantus if a1c <6.5   Goals of care: long term care.    Labs/tests ordered: cbc, bmp 02/20/17 at dialysis    Family/ staff Communication: reviewed care plan with patient and nursing supervisor    [name], MD Internal Medicine [facility] [address] [place], [place] [address] Cell Phone (Monday-Friday 8 am - 5 pm): [phone] On Call: [phone] and follow prompts after 5 pm and on weekends Office Phone: [phone] Office Fax: [phone]

## 2017-02-16 ENCOUNTER — Inpatient Hospital Stay (HOSPITAL_COMMUNITY)
Admission: EM | Admit: 2017-02-16 | Discharge: 2017-02-23 | DRG: 871 | Disposition: A | Discharge status: Skilled Nursing Facility | Payer: Medicare PPO | Attending: Family Medicine | Admitting: Family Medicine

## 2017-02-16 ENCOUNTER — Emergency Department (HOSPITAL_COMMUNITY): Payer: Medicare PPO

## 2017-02-16 ENCOUNTER — Non-Acute Institutional Stay (SKILLED_NURSING_FACILITY): Payer: Medicare PPO | Admitting: Family

## 2017-02-16 ENCOUNTER — Encounter (HOSPITAL_COMMUNITY): Payer: Self-pay | Admitting: Emergency Medicine

## 2017-02-16 DIAGNOSIS — E8889 Other specified metabolic disorders: Secondary | ICD-10-CM | POA: Diagnosis present

## 2017-02-16 DIAGNOSIS — R4182 Altered mental status, unspecified: Secondary | ICD-10-CM | POA: Diagnosis not present

## 2017-02-16 DIAGNOSIS — Z79899 Other long term (current) drug therapy: Secondary | ICD-10-CM

## 2017-02-16 DIAGNOSIS — I428 Other cardiomyopathies: Secondary | ICD-10-CM | POA: Diagnosis present

## 2017-02-16 DIAGNOSIS — K8018 Calculus of gallbladder with other cholecystitis without obstruction: Secondary | ICD-10-CM | POA: Diagnosis present

## 2017-02-16 DIAGNOSIS — Z87891 Personal history of nicotine dependence: Secondary | ICD-10-CM

## 2017-02-16 DIAGNOSIS — D631 Anemia in chronic kidney disease: Secondary | ICD-10-CM | POA: Diagnosis present

## 2017-02-16 DIAGNOSIS — I959 Hypotension, unspecified: Secondary | ICD-10-CM

## 2017-02-16 DIAGNOSIS — I480 Paroxysmal atrial fibrillation: Secondary | ICD-10-CM | POA: Diagnosis present

## 2017-02-16 DIAGNOSIS — Z888 Allergy status to other drugs, medicaments and biological substances status: Secondary | ICD-10-CM

## 2017-02-16 DIAGNOSIS — J449 Chronic obstructive pulmonary disease, unspecified: Secondary | ICD-10-CM | POA: Diagnosis present

## 2017-02-16 DIAGNOSIS — Z95810 Presence of automatic (implantable) cardiac defibrillator: Secondary | ICD-10-CM

## 2017-02-16 DIAGNOSIS — I255 Ischemic cardiomyopathy: Secondary | ICD-10-CM | POA: Diagnosis present

## 2017-02-16 DIAGNOSIS — E1122 Type 2 diabetes mellitus with diabetic chronic kidney disease: Secondary | ICD-10-CM | POA: Diagnosis present

## 2017-02-16 DIAGNOSIS — M791 Myalgia: Secondary | ICD-10-CM | POA: Diagnosis present

## 2017-02-16 DIAGNOSIS — E11610 Type 2 diabetes mellitus with diabetic neuropathic arthropathy: Secondary | ICD-10-CM | POA: Diagnosis present

## 2017-02-16 DIAGNOSIS — Z7982 Long term (current) use of aspirin: Secondary | ICD-10-CM

## 2017-02-16 DIAGNOSIS — A419 Sepsis, unspecified organism: Secondary | ICD-10-CM | POA: Diagnosis not present

## 2017-02-16 DIAGNOSIS — Z973 Presence of spectacles and contact lenses: Secondary | ICD-10-CM

## 2017-02-16 DIAGNOSIS — Z794 Long term (current) use of insulin: Secondary | ICD-10-CM

## 2017-02-16 DIAGNOSIS — Z992 Dependence on renal dialysis: Secondary | ICD-10-CM

## 2017-02-16 DIAGNOSIS — Z9049 Acquired absence of other specified parts of digestive tract: Secondary | ICD-10-CM

## 2017-02-16 DIAGNOSIS — I5042 Chronic combined systolic (congestive) and diastolic (congestive) heart failure: Secondary | ICD-10-CM | POA: Diagnosis present

## 2017-02-16 DIAGNOSIS — Z7951 Long term (current) use of inhaled steroids: Secondary | ICD-10-CM

## 2017-02-16 DIAGNOSIS — Z955 Presence of coronary angioplasty implant and graft: Secondary | ICD-10-CM

## 2017-02-16 DIAGNOSIS — G4733 Obstructive sleep apnea (adult) (pediatric): Secondary | ICD-10-CM | POA: Diagnosis present

## 2017-02-16 DIAGNOSIS — K810 Acute cholecystitis: Secondary | ICD-10-CM | POA: Diagnosis present

## 2017-02-16 DIAGNOSIS — R197 Diarrhea, unspecified: Secondary | ICD-10-CM | POA: Diagnosis not present

## 2017-02-16 DIAGNOSIS — E1169 Type 2 diabetes mellitus with other specified complication: Secondary | ICD-10-CM | POA: Diagnosis present

## 2017-02-16 DIAGNOSIS — N186 End stage renal disease: Secondary | ICD-10-CM

## 2017-02-16 DIAGNOSIS — R571 Hypovolemic shock: Secondary | ICD-10-CM | POA: Diagnosis present

## 2017-02-16 DIAGNOSIS — E669 Obesity, unspecified: Secondary | ICD-10-CM

## 2017-02-16 DIAGNOSIS — F419 Anxiety disorder, unspecified: Secondary | ICD-10-CM | POA: Diagnosis present

## 2017-02-16 DIAGNOSIS — I4581 Long QT syndrome: Secondary | ICD-10-CM | POA: Diagnosis present

## 2017-02-16 DIAGNOSIS — I132 Hypertensive heart and chronic kidney disease with heart failure and with stage 5 chronic kidney disease, or end stage renal disease: Secondary | ICD-10-CM | POA: Diagnosis present

## 2017-02-16 DIAGNOSIS — Z9981 Dependence on supplemental oxygen: Secondary | ICD-10-CM

## 2017-02-16 DIAGNOSIS — N2581 Secondary hyperparathyroidism of renal origin: Secondary | ICD-10-CM | POA: Diagnosis present

## 2017-02-16 DIAGNOSIS — Z79891 Long term (current) use of opiate analgesic: Secondary | ICD-10-CM

## 2017-02-16 DIAGNOSIS — R112 Nausea with vomiting, unspecified: Secondary | ICD-10-CM

## 2017-02-16 DIAGNOSIS — I2510 Atherosclerotic heart disease of native coronary artery without angina pectoris: Secondary | ICD-10-CM | POA: Diagnosis present

## 2017-02-16 DIAGNOSIS — G9340 Encephalopathy, unspecified: Secondary | ICD-10-CM

## 2017-02-16 DIAGNOSIS — R5381 Other malaise: Secondary | ICD-10-CM | POA: Diagnosis present

## 2017-02-16 DIAGNOSIS — E46 Unspecified protein-calorie malnutrition: Secondary | ICD-10-CM | POA: Diagnosis present

## 2017-02-16 DIAGNOSIS — E162 Hypoglycemia, unspecified: Secondary | ICD-10-CM

## 2017-02-16 DIAGNOSIS — E1140 Type 2 diabetes mellitus with diabetic neuropathy, unspecified: Secondary | ICD-10-CM | POA: Diagnosis present

## 2017-02-16 DIAGNOSIS — R0602 Shortness of breath: Secondary | ICD-10-CM

## 2017-02-16 DIAGNOSIS — Z6823 Body mass index (BMI) 23.0-23.9, adult: Secondary | ICD-10-CM

## 2017-02-16 DIAGNOSIS — Z9689 Presence of other specified functional implants: Secondary | ICD-10-CM | POA: Diagnosis present

## 2017-02-16 DIAGNOSIS — Z882 Allergy status to sulfonamides status: Secondary | ICD-10-CM

## 2017-02-16 DIAGNOSIS — R638 Other symptoms and signs concerning food and fluid intake: Secondary | ICD-10-CM | POA: Diagnosis not present

## 2017-02-16 DIAGNOSIS — Z86718 Personal history of other venous thrombosis and embolism: Secondary | ICD-10-CM

## 2017-02-16 DIAGNOSIS — N185 Chronic kidney disease, stage 5: Secondary | ICD-10-CM

## 2017-02-16 DIAGNOSIS — R579 Shock, unspecified: Secondary | ICD-10-CM | POA: Diagnosis present

## 2017-02-16 DIAGNOSIS — E43 Unspecified severe protein-calorie malnutrition: Secondary | ICD-10-CM | POA: Diagnosis present

## 2017-02-16 DIAGNOSIS — Z91040 Latex allergy status: Secondary | ICD-10-CM

## 2017-02-16 DIAGNOSIS — R109 Unspecified abdominal pain: Secondary | ICD-10-CM

## 2017-02-16 LAB — I-STAT TROPONIN, ED: TROPONIN I, POC: 0.04 ng/mL (ref 0.00–0.08)

## 2017-02-16 LAB — CBC WITH DIFFERENTIAL/PLATELET
BASOS ABS: 0 10*3/uL (ref 0.0–0.1)
Basophils Relative: 0 %
EOS ABS: 0 10*3/uL (ref 0.0–0.7)
EOS PCT: 0 %
HCT: 29.7 % — ABNORMAL LOW (ref 36.0–46.0)
HEMOGLOBIN: 8.6 g/dL — AB (ref 12.0–15.0)
LYMPHS PCT: 12 %
Lymphs Abs: 1.1 10*3/uL (ref 0.7–4.0)
MCH: 23.8 pg — ABNORMAL LOW (ref 26.0–34.0)
MCHC: 29 g/dL — ABNORMAL LOW (ref 30.0–36.0)
MCV: 82.3 fL (ref 78.0–100.0)
Monocytes Absolute: 1.1 10*3/uL — ABNORMAL HIGH (ref 0.1–1.0)
Monocytes Relative: 12 %
NEUTROS PCT: 76 %
Neutro Abs: 7.1 10*3/uL (ref 1.7–7.7)
PLATELETS: 264 10*3/uL (ref 150–400)
RBC: 3.61 MIL/uL — AB (ref 3.87–5.11)
RDW: 17 % — ABNORMAL HIGH (ref 11.5–15.5)
WBC: 9.4 10*3/uL (ref 4.0–10.5)

## 2017-02-16 LAB — COMPREHENSIVE METABOLIC PANEL
ALK PHOS: 141 U/L — AB (ref 38–126)
ALT: 7 U/L — AB (ref 14–54)
AST: 16 U/L (ref 15–41)
Albumin: 1.6 g/dL — ABNORMAL LOW (ref 3.5–5.0)
Anion gap: 20 — ABNORMAL HIGH (ref 5–15)
BUN: 21 mg/dL — AB (ref 6–20)
CHLORIDE: 91 mmol/L — AB (ref 101–111)
CO2: 24 mmol/L (ref 22–32)
Calcium: 8 mg/dL — ABNORMAL LOW (ref 8.9–10.3)
Creatinine, Ser: 3.91 mg/dL — ABNORMAL HIGH (ref 0.44–1.00)
GFR, EST AFRICAN AMERICAN: 13 mL/min — AB (ref >60)
GFR, EST NON AFRICAN AMERICAN: 11 mL/min — AB (ref >60)
Glucose, Bld: 76 mg/dL (ref 65–99)
POTASSIUM: 4.1 mmol/L (ref 3.5–5.1)
SODIUM: 135 mmol/L (ref 135–145)
TOTAL PROTEIN: 8.3 g/dL — AB (ref 6.5–8.1)
Total Bilirubin: 1.2 mg/dL (ref 0.3–1.2)

## 2017-02-16 LAB — I-STAT CG4 LACTIC ACID, ED
LACTIC ACID, VENOUS: 1.08 mmol/L (ref 0.5–1.9)
Lactic Acid, Venous: 1.06 mmol/L (ref 0.5–1.9)

## 2017-02-16 LAB — TSH: TSH: 0.64 u[IU]/mL (ref 0.350–4.500)

## 2017-02-16 LAB — LIPASE, BLOOD

## 2017-02-16 LAB — BRAIN NATRIURETIC PEPTIDE: B Natriuretic Peptide: 2422 pg/mL — ABNORMAL HIGH (ref 0.0–100.0)

## 2017-02-16 MED ORDER — PIPERACILLIN-TAZOBACTAM 3.375 G IVPB
3.3750 g | Freq: Two times a day (BID) | INTRAVENOUS | Status: DC
  Administered 2017-02-16 – 2017-02-19 (×5): 3.375 g via INTRAVENOUS
  Filled 2017-02-16 (×8): qty 50

## 2017-02-16 MED ORDER — SODIUM CHLORIDE 0.9 % IV BOLUS (SEPSIS): 500.0000 mL | Freq: Once | INTRAVENOUS | Status: DC

## 2017-02-16 MED ORDER — VANCOMYCIN HCL 10 G IV SOLR
1500.0000 mg | Freq: Once | INTRAVENOUS | Status: AC
  Administered 2017-02-17: 1500 mg via INTRAVENOUS
  Filled 2017-02-16: qty 1500

## 2017-02-16 MED ORDER — SODIUM CHLORIDE 0.9 % IV BOLUS (SEPSIS)
500.0000 mL | Freq: Once | INTRAVENOUS | Status: AC
  Administered 2017-02-16: 500 mL via INTRAVENOUS

## 2017-02-16 MED ORDER — NOREPINEPHRINE BITARTRATE 1 MG/ML IV SOLN
0.0000 ug/min | Freq: Once | INTRAVENOUS | Status: DC
  Filled 2017-02-16: qty 4

## 2017-02-16 MED ORDER — IOPAMIDOL (ISOVUE-300) INJECTION 61%
INTRAVENOUS | Status: AC
  Administered 2017-02-16: 100 mL
  Filled 2017-02-16: qty 100

## 2017-02-16 NOTE — ED Provider Notes (Signed)
I have personally seen and examined the patient. I have reviewed the documentation on PMH/FH/Soc Hx. I have discussed the plan of care with the resident and patient.  I have reviewed and agree with the resident's documentation. Please see associated encounter note.  Briefly patient is a 66 year old female with a history of CHF with an EF of 35-40%, CAD with multiple stents in place on daily aspirin, AICD in place, type 2 diabetes, ESRD on dialysis Tuesday Thursday Saturday who was recently admitted to the hospital for acute cholecystitis requiring percutaneous draining and she was a poor candidate for surgery. She presents today for persistent abdominal pain and severe fatigue. Patient was noted to be hypotensive upon arrival. Workup was not consistent with sepsis. However the patient's temperature was noted to be creeping up and that she was started on empiric antibiotics to cover unknown source. CT scan revealed improving biliary findings. We feel that the patient's blood pressure is likely secondary to lack of intravascular volume. Patient responded well to transient IV fluid bolus however we were unable to maintain adequate blood pressures with fluids. Given her significant history of CHF and ESRD, she was started on pressors. Left IJ central line was placed. She was admitted to the ICU for continued workup and management.   EKG Interpretation  Date/Time:  Friday February 16 2017 15:37:26 EDT Ventricular Rate:  68 PR Interval:    QRS Duration: 117 QT Interval:  477 QTC Calculation: 508 R Axis:   -33 Text Interpretation:  Sinus rhythm Borderline low voltage, extremity leads Left ventricular hypertrophy Nonspecific T abnormalities, lateral leads Prolonged QT interval No significant change since last tracing Confirmed by [facility] MD, PEDRO [phone]) on 02/16/2017 3:44:27 PM      CRITICAL CARE Performed by: Amadeo Garnet Cardama Total critical care time: 55 minutes Critical care time was exclusive of  separately billable procedures and treating other patients. Critical care was necessary to treat or prevent imminent or life-threatening deterioration. Critical care was time spent personally by me on the following activities: development of treatment plan with patient and/or surrogate as well as nursing, discussions with consultants, evaluation of patient's response to treatment, examination of patient, obtaining history from patient or surrogate, ordering and performing treatments and interventions, ordering and review of laboratory studies, ordering and review of radiographic studies, pulse oximetry and re-evaluation of patient's condition.  I have personally seen and examined the patient.  I have discussed plan of care with resident.  I was present for key and critical portions of procedure as documented. I have reviewed the appropriate documentation on PMH/FH/Soc. History.  I have reviewed the documentation of the resident and agree.     Nira Conn, MD 02/17/17 0000

## 2017-02-16 NOTE — ED Provider Notes (Signed)
[facility]-EMERGENCY DEPT Provider Note   CSN: [id number] Arrival date & time: 02/16/17  1454     History   Chief Complaint Chief Complaint  Patient presents with  . Hypotension    HPI Brandy Dudley is a 66 y.o. female.  HPI  66 year old female with history of CHF with LVEF of 35-40%, CAD with multiple stents in place on 81 mg of aspirin daily, AICD in place, insulin-dependent DM 2, ESRD on HD Tuesday/Thursday/Saturday, recent admission to the hospital for acute cholecystitis with biliary drain in place as the patient was too high risk for cholecystectomy, recent placement of dialysis graft to her left lower extremity during this recent inpatient hospitalization secondary to clotting of her upper extremity grafts, who presents for evaluation of abdominal pain. Patient states that since discharge home, she has had increasing pain in her left upper quadrant. States she never had pain to the right side of her abdomen with her cholecystitis and that this is the same pain she had before. Endorses nausea but no vomiting. Also reports fatigue, lightheadedness, and blurry vision with standing. No blurry vision at rest. Denies fevers or chills. She makes approximately a teaspoon of urine monthly, and denies dysuria. Denies cough, diarrhea, sore throat, rhinorrhea, generalized myalgias. Endorses some mild tenderness over her graft site but no pain in that area. No pain at the site of her biliary drain. States she's had good drain output.  Past Medical History:  Diagnosis Date  . AICD (automatic cardioverter/defibrillator) present    Biotronik Inventra 7 VR-T DX 04/12/15  . Anemia   . Anxiety   . C. difficile colitis   . Cardiomyopathy (HCC)    02/02/16 ([facility]): Mixed ischemic and non-ischemic cardiomyopathy  . Charcot's joint of left foot   . CHF (congestive heart failure) (HCC)    systolic  . Cholelithiases 01/2017  . Complication of anesthesia 02/02/2017   has anxeity when awaken with  gallbladder surgery  . COPD (chronic obstructive pulmonary disease) (HCC)   . Coronary artery disease   . Diabetes mellitus (HCC)    Type II  . DVT (deep venous thrombosis) (HCC)    on coumadin  . ESRD (end stage renal disease) (HCC)    tues/thurs/sat dialysis  . Hypertension   . Neuropathy (HCC)   . Obstructive sleep apnea    no cpap  . Renal disorder   . Wears glasses     Patient Active Problem List   Diagnosis Date Noted  . Shock (HCC) 02/17/2017  . Acute cholecystitis 02/02/2017  . Preoperative clearance   . Protein-calorie malnutrition, severe 07/18/2016  . Positive blood culture 07/17/2016  . Atherosclerotic peripheral vascular disease (HCC) 07/17/2016  . Bacteremia 07/17/2016  . Depression 07/05/2016  . Umbilical hernia without obstruction or gangrene 07/05/2016  . Generalized anxiety disorder 07/05/2016  . Loss of weight 07/05/2016  . Chronic combined systolic and diastolic CHF (congestive heart failure) (HCC) 05/07/2016  . 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 Anemia of chronic renal failure, stage 5 (HCC) 04/14/2016  . ESRD on dialysis (HCC)   . History of Clostridium difficile colitis 03/20/2016  . COPD (chronic obstructive pulmonary disease) (HCC) 03/20/2016  . Obstructive sleep apnea 03/20/2016  . AICD (automatic cardioverter/defibrillator) present 03/20/2016  . Physical deconditioning 03/02/2016  . DVT (deep venous thrombosis), right 03/02/2016  . Cardiomyopathy, ischemic 03/02/2016  . Coronary artery disease involving native coronary artery of native heart without angina pectoris 03/02/2016  . Diabetes mellitus type  2 in obese (HCC) 03/02/2016    Past Surgical History:  Procedure Laterality Date  . AV FISTULA PLACEMENT Left 04/03/2016   Procedure: ARTERIOVENOUS (AV) FISTULA CREATION;  Surgeon: Larina Earthly, MD;  Location[facility] OR;  Service: Vascular;  Laterality: Left;  . AV FISTULA PLACEMENT Right 08/21/2016    Procedure: INSERTION RIGHT ARM ARTERIOVENOUS GRAFT USING 4-7MM X45  CM ACUSEAL GRAFT;  Surgeon: Maeola Harman, MD;  Location: [facility] OR;  Service: Vascular;  Laterality: Right;  . AV FISTULA PLACEMENT Left 02/12/2017   Procedure: INSERTION OF ARTERIOVENOUS (AV) GORE-TEX GRAFT THIGH;  Surgeon: Maeola Harman, MD;  Location: [facility] OR;  Service: Vascular;  Laterality: Left;  Using Goretex 4-28mm Vascular Graft  . CORONARY ANGIOPLASTY     LCX stent 2010; by [facility] 01/2016 note: 03/2014 LHC/RHC: minor luminal irregularities LAD and RCA with patent stent first marginal branch of CX. RA 11, PAP 58/22 with mean 39, mean wedge pressure 19, LVEDP 27, PVR 3.9, CO 5.1, Cardiac index 2.5.   . FISTULA SUPERFICIALIZATION Left 06/16/2016   Procedure: SUPERFICIALIZATION OF LEFT ARM BRACHIOCEPHALIC ARTERIOVENOUS FISTULA;  Surgeon: Chuck Hint, MD;  Location: [facility] OR;  Service: Vascular;  Laterality: Left;  . INSERTION OF DIALYSIS CATHETER Left 04/03/2016   Procedure: INSERTION OF DIALYSIS CATHETER;  Surgeon: Larina Earthly, MD;  Location: [facility] OR;  Service: Vascular;  Laterality: Left;  . IR GENERIC HISTORICAL  02/02/2017   IR PERC CHOLECYSTOSTOMY 02/02/2017 Malachy Moan, MD [facility]-INTERV RAD  . LIGATION OF COMPETING BRANCHES OF ARTERIOVENOUS FISTULA Left 06/16/2016   Procedure: LIGATION OF COMPETING BRANCHES OF left brachiocephalic ARTERIOVENOUS FISTULA;  Surgeon: Chuck Hint, MD;  Location: [facility] OR;  Service: Vascular;  Laterality: Left;  . OTHER SURGICAL HISTORY  02/02/2017   Transhepatic percutaneous cholecystostomy placement  . PERIPHERAL VASCULAR CATHETERIZATION N/A 07/20/2016   Procedure: A/V Shuntogram/Fistulagram;  Surgeon: Fransisco Hertz, MD;  Location: [facility] INVASIVE CV LAB;  Service: Cardiovascular;  Laterality: N/A;  . PERIPHERAL VASCULAR CATHETERIZATION N/A 07/20/2016   Procedure: Dialysis/Perma Catheter Insertion;  Surgeon: Fransisco Hertz, MD;  Location: [facility] INVASIVE CV LAB;  Service:  Cardiovascular;  Laterality: N/A;  . REVISION OF ARTERIOVENOUS GORETEX GRAFT Left 06/16/2016   Procedure: REVISION OF LEFT BRACHIOCEPHALIC ARTERIOVENOUS FISTULA WITH RESECTION OF REDUNDANT SECTION;  Surgeon: Chuck Hint, MD;  Location: [facility] OR;  Service: Vascular;  Laterality: Left;    OB History    No data available       Home Medications    Prior to Admission medications   Medication Sig Start Date End Date Taking? Authorizing Provider  acetaminophen (TYLENOL) 325 MG tablet Take 325 mg by mouth every 6 (six) hours as needed for mild pain or fever (For fever >99.5). Take 650 mg by mouth q6h prn for mild pain   Yes Historical Provider, MD  albuterol (PROAIR HFA) 108 (90 Base) MCG/ACT inhaler Inhale 2 puffs into the lungs every 6 (six) hours as needed for wheezing or shortness of breath.    Yes Historical Provider, MD  amiodarone (PACERONE) 200 MG tablet Take 1 tablet (200 mg total) by mouth daily. 10/11/16  Yes Marinus Maw, MD  atorvastatin (LIPITOR) 10 MG tablet Take 10 mg by mouth at bedtime.    Yes Historical Provider, MD  famotidine (PEPCID) 20 MG tablet Take 1 tablet (20 mg total) by mouth at bedtime. 07/22/16  Yes Maryann Mikhail, DO  fluticasone (FLOVENT HFA) 110 MCG/ACT inhaler Inhale 2 puffs into the lungs  2 (two) times daily.    Yes Historical Provider, MD  hydrOXYzine (ATARAX/VISTARIL) 50 MG tablet Take 50 mg by mouth every 6 (six) hours as needed for itching.   Yes Historical Provider, MD  insulin glargine (LANTUS) 100 UNIT/ML injection Inject 8 Units into the skin every morning.    Yes Historical Provider, MD  insulin lispro (HUMALOG) 100 UNIT/ML injection Inject 0-10 Units into the skin 3 (three) times daily as needed for high blood sugar (CBG >180). CBG 0-59 Hypoglycemic Protocol, 60-149 0 units 150-250 5 units, 251-300 8 units, 301-350 10 units, >350 call MD   Yes Historical Provider, MD  Inulin (FIBER CHOICE PO) Take 2 tablets by mouth daily as needed (Constipation).     Yes Historical Provider, MD  lactose free nutrition (BOOST PLUS) LIQD Take 237 mLs by mouth daily.    Yes Historical Provider, MD  loperamide (IMODIUM) 2 MG capsule Take 1 capsule (2 mg total) by mouth 4 (four) times daily as needed for diarrhea or loose stools. 09/16/16  Yes Jacalyn Lefevre, MD  metoprolol succinate (TOPROL-XL) 25 MG 24 hr tablet Take 12.5 mg by mouth 2 (two) times daily. HOLD for SBP <110 and HP <60   Yes Historical Provider, MD  mirtazapine (REMERON) 15 MG tablet Take 15 mg by mouth at bedtime.   Yes Historical Provider, MD  Multiple Vitamins-Minerals (CERTAVITE/ANTIOXIDANTS) TABS Take 1 tablet by mouth at bedtime.   Yes Historical Provider, MD  nitroGLYCERIN (NITROSTAT) 0.4 MG SL tablet Place 0.4 mg under the tongue every 5 (five) minutes as needed for chest pain.   Yes Historical Provider, MD  Nutritional Supplements (ENSURE CLEAR) LIQD Take 1 each by mouth daily. For 14 days   Yes Historical Provider, MD  oxyCODONE-acetaminophen (PERCOCET/ROXICET) 5-325 MG tablet Take 1-2 tablets by mouth every 6 (six) hours as needed for moderate pain or severe pain.   Yes Historical Provider, MD  Propylene Glycol (SYSTANE BALANCE) 0.6 % SOLN Place 1 drop into both eyes 2 (two) times daily.   Yes Historical Provider, MD  sertraline (ZOLOFT) 25 MG tablet Take 25 mg by mouth daily.    Yes Historical Provider, MD  OXYGEN Inhale 2 L into the lungs as needed.    Historical Provider, MD    Family History No family history on file.  Social History Social History  Substance Use Topics  . Smoking status: Former Smoker    Types: Cigarettes    Quit date: 03/03/2006  . Smokeless tobacco: Never Used  . Alcohol use No     Allergies   Entresto [sacubitril-valsartan]; Latex; and Sulfa antibiotics   Review of Systems Review of Systems  Constitutional: Positive for fatigue. Negative for chills, diaphoresis and fever.  HENT: Negative for congestion, ear pain, rhinorrhea and sore throat.     Eyes: Positive for visual disturbance (waxing and waning, not present currently). Negative for pain.  Respiratory: Positive for shortness of breath (only with lying flat, at baseline). Negative for cough, wheezing and stridor.   Cardiovascular: Negative for chest pain, palpitations and leg swelling.  Gastrointestinal: Positive for abdominal pain and nausea. Negative for abdominal distention, blood in stool, constipation, diarrhea and vomiting.  Genitourinary: Negative for decreased urine volume and flank pain.  Musculoskeletal: Positive for myalgias (L thigh). Negative for arthralgias, back pain, joint swelling, neck pain and neck stiffness.  Skin: Negative for color change and rash.  Neurological: Positive for light-headedness (not present currently). Negative for dizziness, syncope, speech difficulty, weakness, numbness and headaches.  Psychiatric/Behavioral:  Negative for agitation, behavioral problems and confusion.     Physical Exam Updated Vital Signs BP (!) 73/49   Pulse 81   Temp 99.2 F (37.3 C) (Rectal)   Resp 14   Ht 5\' 8"  (1.727 m)   Wt 74.4 kg   SpO2 97%   BMI 24.94 kg/m   Physical Exam  Constitutional: She is oriented to person, place, and time. She appears well-developed and well-nourished.  HENT:  Head: Normocephalic and atraumatic.  Right Ear: External ear normal.  Left Ear: External ear normal.  Nose: Nose normal.  Mouth/Throat: Oropharynx is clear and moist. No oropharyngeal exudate.  Eyes: Conjunctivae and EOM are normal. Pupils are equal, round, and reactive to light. Right eye exhibits no discharge. Left eye exhibits no discharge.  Neck: Normal range of motion. Neck supple.  Cardiovascular: Normal rate, regular rhythm, normal heart sounds and intact distal pulses.  Exam reveals no gallop and no friction rub.   No murmur heard. Pulmonary/Chest: Effort normal and breath sounds normal. No respiratory distress. She has no wheezes. She has no rales.  Abdominal:  Soft. Bowel sounds are normal. She exhibits no distension. There is tenderness (LUQ). There is no guarding.  Biliary drain in place to the RUQ, with yellow/green/brown drainage  Musculoskeletal: Normal range of motion. She exhibits no edema.  Procedure site from recent LLE graft placement is c/d/I without drainage. "Soreness" with palpation to the area, but no pain, erythema, or edema.  Neurological: She is alert and oriented to person, place, and time. She exhibits normal muscle tone.  Skin: Skin is warm and dry. No rash noted.  Psychiatric: She has a normal mood and affect. Her behavior is normal. Judgment and thought content normal.     ED Treatments / Results  Labs (all labs ordered are listed, but only abnormal results are displayed) Labs Reviewed  COMPREHENSIVE METABOLIC PANEL - Abnormal; Notable for the following:       Result Value   Chloride 91 (*)    BUN 21 (*)    Creatinine, Ser 3.91 (*)    Calcium 8.0 (*)    Total Protein 8.3 (*)    Albumin 1.6 (*)    ALT 7 (*)    Alkaline Phosphatase 141 (*)    GFR calc non Af Amer 11 (*)    GFR calc Af Amer 13 (*)    Anion gap 20 (*)    All other components within normal limits  CBC WITH DIFFERENTIAL/PLATELET - Abnormal; Notable for the following:    RBC 3.61 (*)    Hemoglobin 8.6 (*)    HCT 29.7 (*)    MCH 23.8 (*)    MCHC 29.0 (*)    RDW 17.0 (*)    Monocytes Absolute 1.1 (*)    All other components within normal limits  LIPASE, BLOOD - Abnormal; Notable for the following:    Lipase <10 (*)    All other components within normal limits  BRAIN NATRIURETIC PEPTIDE - Abnormal; Notable for the following:    B Natriuretic Peptide 2,422.0 (*)    All other components within normal limits  GLUCOSE, CAPILLARY - Abnormal; Notable for the following:    Glucose-Capillary 54 (*)    All other components within normal limits  CULTURE, BLOOD (ROUTINE X 2)  CULTURE, BLOOD (ROUTINE X 2)  TSH  T4  BASIC METABOLIC PANEL  MAGNESIUM    PHOSPHORUS  PROCALCITONIN  CBC WITH DIFFERENTIAL/PLATELET  CBC  BASIC METABOLIC PANEL  I-STAT CG4  LACTIC ACID, ED  I-STAT TROPOININ, ED  I-STAT CG4 LACTIC ACID, ED    EKG  EKG Interpretation  Date/Time:  Friday February 16 2017 15:37:26 EDT Ventricular Rate:  68 PR Interval:    QRS Duration: 117 QT Interval:  477 QTC Calculation: 508 R Axis:   -33 Text Interpretation:  Sinus rhythm Borderline low voltage, extremity leads Left ventricular hypertrophy Nonspecific T abnormalities, lateral leads Prolonged QT interval No significant change since last tracing Confirmed by [facility] MD, PEDRO ([id number]) on 02/16/2017 3:44:27 PM       Radiology Dg Chest 2 View  Result Date: 02/16/2017 CLINICAL DATA:  Confusion and hypotension EXAM: CHEST  2 VIEW COMPARISON:  09/16/2016 FINDINGS: Dialysis catheter is noted in the right jugular vein in satisfactory position. This is new from the prior exam. A defibrillator is again seen and stable. Cardiomegaly is again noted. Mild vascular congestion is seen likely of a chronic nature. No focal infiltrate or sizable effusion is seen. IMPRESSION: Mild vascular congestion which may be related to volume overload. No other focal abnormality is seen. Electronically Signed   By: Alcide Clever M.D.   On: 02/16/2017 16:13   Ct Abdomen Pelvis W Contrast  Result Date: 02/16/2017 CLINICAL DATA:  66 year old female with multiple medical comorbidities status post transhepatic percutaneous cholecystostomy tube on 02/02/2017. Abdominal pain and hypotension. Initial encounter. EXAM: CT ABDOMEN AND PELVIS WITH CONTRAST TECHNIQUE: Multidetector CT imaging of the abdomen and pelvis was performed using the standard protocol following bolus administration of intravenous contrast. CONTRAST:  ISOVUE-300 IOPAMIDOL (ISOVUE-300) INJECTION 61%, however, it appears the vast majority of a contrast dose extravasated into the left upper extremity as seen on coronal image 80. COMPARISON:  CT  Abdomen and Pelvis 02/01/2017, and earlier. FINDINGS: Essentially this is a noncontrast exam may in light of the IV extravasation into the left upper extremity. Lower chest: Left lower chest wall cardiac AICD type device with streak artifact. Severe cardiomegaly. No pericardial effusion. Continued medial left lower lobe atelectasis or consolidation with superimposed calcified granuloma. Interval improved ventilation at the right lung base with residual peribronchial atelectasis. Virtually resolved bilateral small pleural effusions. Hepatobiliary: Transhepatic percutaneous cholecystostomy tube in place with decompressed gallbladder. No biliary ductal enlargement. No discrete liver lesion. Pancreas: Negative. Spleen: Negative. Adrenals/Urinary Tract: Mild bilateral adrenal gland thickening compatible with hyperplasia stable. The kidneys appears stable without hydronephrosis or perinephric stranding. Diminutive, unremarkable urinary bladder. No abdominal free fluid. Stomach/Bowel: Decreased stool ball in the rectum but increased circumferential rectal wall thickening, up to 19 mm (series 21, image 69). Similar retained stool in the sigmoid colon and left colon. Similar retained stool in the transverse colon and right colon. Negative appendix (series 21, image 61). No dilated small bowel. Negative stomach and duodenum. Vascular/Lymphatic: Extensive Aortoiliac calcified atherosclerosis. Partially visible left femoral region dialysis graft. Poor vascular contrast bolus. Vascular patency not evaluated. Reproductive: Negative. Other: No pelvic free fluid. Rectus muscle diastases with small developing fat and small bowel containing ventral abdominal hernia, non incarcerated. See sagittal image 79. Musculoskeletal: Chronic severe rose to changes throughout the visible lower thoracic spine to the T12 level, not new but progressed since September 2017. However, these bony changes are new since June of 2017. The superior L1  vertebral body now is being eroded, was intact in September. The erosive changes are accompanied by partially visible pedicle fractures at T7. The visible lower ribs remain intact. Severe lower lumbar facet arthropathy. Pelvis and proximal femurs intact. IMPRESSION: 1. Extravasation of the  contrast for this study into the patient left upper extremity near the antecubital fossa. Recommend physical exam evaluation of the IV site, elevation of the extremity, and ice application. Recommend surveillance for progressive skin changes, which if detected should prompt plastic surgery consultation. 2. Progressively abnormal appearance of the visualized thoracic spine with unusual vertebral body erosion which has developed since June of last year. New erosion of the superior T1 vertebral body since September. A pathologic fracture of T7 is suspected but incompletely visualized. 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 3. Right transhepatic percutaneous cholecystostomy tube with no adverse features. 4. Decreased rectal fecal impaction but circumferential rectal wall thickening suggestive of proctitis. However, no other bowel inflammation. No bowel obstruction. 5. Resolved small bilateral pleural effusions since earlier this month. Residual lower lobe atelectasis. 6. Severe cardiomegaly. Extensive calcified aortoiliac atherosclerosis. 7. Salient findings on this exam were discussed by telephone with Dr. Drema Pry on 02/16/2017 at 1930 hours. Electronically Signed   By: Odessa Fleming M.D.   On: 02/16/2017 19:34   Dg Chest Portable 1 View  Result Date: 02/16/2017 CLINICAL DATA:  Central line placement.  Initial encounter. EXAM: PORTABLE CHEST 1 VIEW COMPARISON:  Chest radiograph performed earlier today at 4:02 p.m. FINDINGS: The lungs are well-aerated. Mild vascular congestion is noted. Increased interstitial markings may reflect mild interstitial  edema. There is no evidence of pleural effusion or pneumothorax. The cardiomediastinal silhouette is enlarged. A right-sided dual-lumen catheter is noted ending at the right atrium. An AICD is noted overlying the left chest wall, with a lead ending overlying the right ventricle. No acute osseous abnormalities are seen. A left IJ line is noted ending about the distal SVC. IMPRESSION: 1. Left IJ line noted ending about the distal SVC. 2. Mild vascular congestion and cardiomegaly noted. Increased interstitial markings raise concern for mild interstitial edema. Electronically Signed   By: Roanna Raider M.D.   On: 02/16/2017 23:52    Procedures .Central Line Date/Time: 02/17/2017 2:31 AM Performed by: Charlie Pitter Authorized by: Nira Conn   Consent:    Consent obtained:  Written   Consent given by:  Patient   Risks discussed:  Arterial puncture, bleeding, infection, incorrect placement and pneumothorax   Alternatives discussed:  Observation Pre-procedure details:    Hand hygiene: Hand hygiene performed prior to insertion     Sterile barrier technique: All elements of maximal sterile technique followed     Skin preparation:  ChloraPrep   Skin preparation agent: Skin preparation agent completely dried prior to procedure   Anesthesia (see MAR for exact dosages):    Anesthesia method:  Local infiltration   Local anesthetic:  Lidocaine 1% w/o epi Procedure details:    Location:  L internal jugular   Patient position:  Flat   Procedural supplies:  Triple lumen   Ultrasound guidance: yes     Sterile ultrasound techniques: Sterile gel and sterile probe covers were used     Number of attempts:  2   Successful placement: yes   Post-procedure details:    Post-procedure:  Dressing applied and line sutured   Assessment:  Blood return through all ports, free fluid flow, no pneumothorax on x-ray and placement verified by x-ray   Patient tolerance of procedure:  Tolerated well, no  immediate complications   (including critical care time)  Medications Ordered in ED Medications  piperacillin-tazobactam (ZOSYN) IVPB 3.375 g (3.375 g Intravenous New Bag/Given 02/16/17  1721)  norepinephrine (LEVOPHED) 4 mg in dextrose 5 % 250 mL (0.016 mg/mL) infusion (not administered)  0.9 %  sodium chloride infusion (not administered)  sodium chloride 0.9 % bolus 1,000 mL (not administered)  enoxaparin (LOVENOX) injection 75 mg (not administered)  tiotropium (SPIRIVA) inhalation capsule 18 mcg (not administered)  mometasone-formoterol (DULERA) 200-5 MCG/ACT inhaler 2 puff (not administered)  sodium chloride 0.9 % bolus 500 mL (500 mLs Intravenous New Bag/Given 02/16/17 1640)  iopamidol (ISOVUE-300) 61 % injection (100 mLs  Contrast Given 02/16/17 1851)  vancomycin (VANCOCIN) 1,500 mg in sodium chloride 0.9 % 500 mL IVPB (1,500 mg Intravenous Transfusing/Transfer 02/17/17 0159)     Initial Impression / Assessment and Plan / ED Course  I have reviewed the triage vital signs and the nursing notes.  Pertinent labs & imaging results that were available during my care of the patient were reviewed by me and considered in my medical decision making (see chart for details).     Patient is afebrile. Hypotensive but mentating normally on arrival. Abdomen tender to the left upper quadrant. Given recent cholecystitis there is concern for intra-abdominal infection. As the pt still makes some (very minimal) urine, risk/benefits of CT W contrast discussed with the pt, and she agrees that the potential benefits of this scan outweigh the risks.   CT shows cholecystostomy tube in appropriate placement with no adverse features. Patient has findings suggestive of proctitis. No other intra-abdominal findings to explain her pain. She does have some lesions to her thoracic spine that are likely representative of severe renal osteodystrophy. After discussion with radiology, findings would be very atypical for  spinal discitis. Doubt that this is patient's infectious source.   Of note, while back at CT the pt's IV infiltrated and some IV contrast extravasated into her L arm. She denies pain in the area and there is no palpable swelling. Admitting team aware.   Chest x-ray with pulmonary vascular congestion that may be related to fluid overload but no focal consolidations to suggest pneumonia. No lactic acidosis. Patient does not have an elevated white blood cell count. Hemoglobin at baseline.  Patient is found to have a greatly elevated BNP to 2422. This is difficult to interpret in the setting of the patient's ESRD, however she did undergo a full dialysis treatment yesterday.  Patient's blood pressure transiently responded to 500 mL of IV fluid, but given end-stage renal disease, CHF, and pulmonary vascular congestion on chest x-ray, I'm concerned that additional fluid resuscitation with cause respiratory distress. A L IJ central line was placed and the pt was started on levophed for pressure support.   Temperature creeping up to 99.2 while in the ED. She was started on vancomycin and Zosyn for empiric treatment of possible infectious source for her hypotension. Patient's recent graft site to her left lower extremity is clean, dry, and intact, and I doubt this as an infectious source. Patient admitted to the ICU for further management.  Care of pt overseen by my attending, Dr. Eudelia Bunch.   Final Clinical Impressions(s) / ED Diagnoses   Final diagnoses:  Hypotension, unspecified hypotension type    New Prescriptions Current Discharge Medication List       Charlie Pitter, MD 02/17/17 [phone]

## 2017-02-16 NOTE — ED Notes (Signed)
Pt transported to CT ?

## 2017-02-16 NOTE — ED Notes (Signed)
Timeout for central line, consent from both her and her sister for procedure signed.

## 2017-02-16 NOTE — Progress Notes (Addendum)
Pharmacy Antibiotic Note  Brandy Dudley is a 66 y.o. female admitted on 02/16/2017 with intra-abdominal infection.  Pharmacy has been consulted for zosyn dosing. Patient has ESRD on HD. Schedule appears to be TTS based on past admit notes. On admission, patient was hypotensive and afebrile.   Plan: Zosyn 3.375gm IV every 12 hours (over 4 hours) Monitor HD schedule, clinical progress  Height: 5\' 8"  (172.7 cm) Weight: 164 lb (74.4 kg) IBW/kg (Calculated) : 63.9  Temp (24hrs), Avg:97.8 F (36.6 C), Min:97.7 F (36.5 C), Max:97.9 F (36.6 C)   Recent Labs Lab 02/12/17 1913  WBC 9.8  CREATININE 4.87*    Estimated Creatinine Clearance: 11.5 mL/min (A) (by C-G formula based on SCr of 4.87 mg/dL (H)).    Allergies  Allergen Reactions  . Entresto [Sacubitril-Valsartan] Other (See Comments)     unknown reaction  . Latex Itching  . Sulfa Antibiotics Itching   Antimicrobials this admission: 3/23 zosyn >>   Thank you for allowing pharmacy to be a part of this patient's care.  Carylon Perches, PharmD Acute Care Pharmacy Resident  Pager: [phone] 02/16/2017   UPDATE:  Vancomycin added - give 1500mg  IV x1 and f/u plan for HD schedule for further maintenance doses. Per patient, has not been getting ABx with dialysis that she knows of.  Carylon Perches, PharmD Acute Care Pharmacy Resident  Pager: [phone] 02/16/2017

## 2017-02-16 NOTE — ED Notes (Signed)
Unable to start antibiotics until blood cultures obtain. Attempting at this time.

## 2017-02-16 NOTE — ED Notes (Signed)
MD Bedside for central line insertion

## 2017-02-16 NOTE — ED Notes (Signed)
Report given to Maryruth Hancock RN

## 2017-02-16 NOTE — ED Notes (Signed)
Pt's IV has infiltrated. Dr. Moody Bruins advised we are waiting on family to arrive to discuss more advanced catheter placement such a central line.

## 2017-02-16 NOTE — ED Triage Notes (Signed)
Per EMS, patient from [facility].  Hypotension noted.  En route, 88/51.  Strong radial pulses.  SNF stated that patient AMS but answering all questions correctly and following commands.  Decreased oral intake.  Unable to have gallbladder removed due to Heart issues.  Drain placed.  Graft in thigh placed recently.  No BP meds given today.  NSR. EKG unremarkable.  84 CBG, Lungs clear.  RR 16. HR 70s.

## 2017-02-16 NOTE — Progress Notes (Signed)
Location:  [facility] Room Number: 305 B  Place of Service:  SNF (31) Provider: Cleo Villamizar FNP-C  [name], MD  Patient Care Team: [name], MD as PCP - General (Internal Medicine) Laurey Morale, MD as Consulting Physician (Cardiology) Bobbye Riggs, MD (Specialist)  Extended Emergency Contact Information Primary Emergency Contact: Haith,[name] Address: [address]          [place], [place] [address] [place] Home Phone: [phone] Relation: Sister Secondary Emergency Contact: Marcello Moores, [place] Home Phone: [phone] Relation: Sister  Code Status:  Full Code  Goals of care: Advanced Directive information Advanced Directives 02/12/2017  Does Patient Have a Medical Advance Directive? No  Type of Advance Directive -  Does patient want to make changes to medical advance directive? No - Patient declined  Copy of Healthcare Power of Attorney in Chart? -  Would patient like information on creating a medical advance directive? No - Patient declined     Chief Complaint  Patient presents with  . Acute Visit    confused     HPI:  Pt is a 66 y.o. female seen today at [facility] and rehabilitation for an acute visit for evaluation of increased confusion and low blood pressure. She has a significant medical history of Type 2 DM, COPD, CAD,AICD, CHF, ESRD on hemodialysis three times per week, PVD, Depression among other conditions. She is seen in her room today with facility Nurse Supervisor and ADON at the bedside. Facility Nurse reports patient has had increased confusion and poor oral intake. Patient states feels like she is dying request nurse to call her sister. She complains of nausea and vomiting unable to tolerated food. She denies any fever, chills, constipation, diarrhea or abdominal pain. CBG checked was 57 apple juice given but unable to tolerate. Of note she is  status post hospital admission from 02/01/17-02/06/17 with acute abdominal pain. She was diagnosed with acute cholecystitis and had a gall bladder drain placement by IR for conservative management. She has had > 600 ml output over 24 Hours. She is also status post left femoral dialysis graft placement.   Past Medical History:  Diagnosis Date  . AICD (automatic cardioverter/defibrillator) present    Biotronik Inventra 7 VR-T DX 04/12/15  . Anemia   . Anxiety   . C. difficile colitis   . Cardiomyopathy (HCC)    02/02/16 ([facility]): Mixed ischemic and non-ischemic cardiomyopathy  . Charcot's joint of left foot   . CHF (congestive heart failure) (HCC)    systolic  . Cholelithiases 01/2017  . Complication of anesthesia 02/02/2017   has anxeity when awaken with gallbladder surgery  . COPD (chronic obstructive pulmonary disease) (HCC)   . Coronary artery disease   . Diabetes mellitus (HCC)    Type II  . DVT (deep venous thrombosis) (HCC)    on coumadin  . ESRD (end stage renal disease) (HCC)    tues/thurs/sat dialysis  . Hypertension   . Neuropathy (HCC)   . Obstructive sleep apnea    no cpap  . Renal disorder   . Wears glasses    Past Surgical History:  Procedure Laterality Date  . AV FISTULA PLACEMENT Left 04/03/2016   Procedure: ARTERIOVENOUS (AV) FISTULA CREATION;  Surgeon: Larina Earthly, MD;  Location: [facility] OR;  Service: Vascular;  Laterality: Left;  . AV FISTULA PLACEMENT Right 08/21/2016  Procedure: INSERTION RIGHT ARM ARTERIOVENOUS GRAFT USING 4-7MM X45  CM ACUSEAL GRAFT;  Surgeon: Maeola Harman, MD;  Location: [facility] OR;  Service: Vascular;  Laterality: Right;  . AV FISTULA PLACEMENT Left 02/12/2017   Procedure: INSERTION OF ARTERIOVENOUS (AV) GORE-TEX GRAFT THIGH;  Surgeon: Maeola Harman, MD;  Location: [facility] OR;  Service: Vascular;  Laterality: Left;  Using Goretex 4-54mm Vascular Graft  . CORONARY ANGIOPLASTY     LCX stent 2010; by [facility] 01/2016 note:  03/2014 LHC/RHC: minor luminal irregularities LAD and RCA with patent stent first marginal branch of CX. RA 11, PAP 58/22 with mean 39, mean wedge pressure 19, LVEDP 27, PVR 3.9, CO 5.1, Cardiac index 2.5.   . FISTULA SUPERFICIALIZATION Left 06/16/2016   Procedure: SUPERFICIALIZATION OF LEFT ARM BRACHIOCEPHALIC ARTERIOVENOUS FISTULA;  Surgeon: Chuck Hint, MD;  Location: [facility] OR;  Service: Vascular;  Laterality: Left;  . INSERTION OF DIALYSIS CATHETER Left 04/03/2016   Procedure: INSERTION OF DIALYSIS CATHETER;  Surgeon: Larina Earthly, MD;  Location: [facility] OR;  Service: Vascular;  Laterality: Left;  . IR GENERIC HISTORICAL  02/02/2017   IR PERC CHOLECYSTOSTOMY 02/02/2017 Malachy Moan, MD [facility]-INTERV RAD  . LIGATION OF COMPETING BRANCHES OF ARTERIOVENOUS FISTULA Left 06/16/2016   Procedure: LIGATION OF COMPETING BRANCHES OF left brachiocephalic ARTERIOVENOUS FISTULA;  Surgeon: Chuck Hint, MD;  Location: [facility] OR;  Service: Vascular;  Laterality: Left;  . OTHER SURGICAL HISTORY  02/02/2017   Transhepatic percutaneous cholecystostomy placement  . PERIPHERAL VASCULAR CATHETERIZATION N/A 07/20/2016   Procedure: A/V Shuntogram/Fistulagram;  Surgeon: Fransisco Hertz, MD;  Location: [facility] INVASIVE CV LAB;  Service: Cardiovascular;  Laterality: N/A;  . PERIPHERAL VASCULAR CATHETERIZATION N/A 07/20/2016   Procedure: Dialysis/Perma Catheter Insertion;  Surgeon: Fransisco Hertz, MD;  Location: [facility] INVASIVE CV LAB;  Service: Cardiovascular;  Laterality: N/A;  . REVISION OF ARTERIOVENOUS GORETEX GRAFT Left 06/16/2016   Procedure: REVISION OF LEFT BRACHIOCEPHALIC ARTERIOVENOUS FISTULA WITH RESECTION OF REDUNDANT SECTION;  Surgeon: Chuck Hint, MD;  Location: [facility] OR;  Service: Vascular;  Laterality: Left;    Allergies  Allergen Reactions  . Entresto [Sacubitril-Valsartan] Other (See Comments)     unknown reaction  . Latex Itching  . Sulfa Antibiotics Itching    Allergies as of 02/16/2017      Reactions     Entresto [sacubitril-valsartan] Other (See Comments)    unknown reaction   Latex Itching   Sulfa Antibiotics Itching      Medication List       Accurate as of 02/16/17  2:06 PM. Always use your most recent med list.          acetaminophen 325 MG tablet Commonly known as:  TYLENOL Take 325 mg by mouth every 6 (six) hours as needed for mild pain or fever (For fever >99.5). Take 650 mg by mouth q6h prn for mild pain   amiodarone 200 MG tablet Commonly known as:  PACERONE Take 1 tablet (200 mg total) by mouth daily.   atorvastatin 10 MG tablet Commonly known as:  LIPITOR Take 10 mg by mouth at bedtime.   CERTAVITE/ANTIOXIDANTS Tabs Take 1 tablet by mouth at bedtime.   famotidine 20 MG tablet Commonly known as:  PEPCID Take 1 tablet (20 mg total) by mouth at bedtime.   FIBER CHOICE PO Take 2 tablets by mouth daily as needed (Constipation).   fluticasone 110 MCG/ACT inhaler Commonly known as:  FLOVENT HFA Inhale 2 puffs into the lungs 2 (two) times  daily.   hydrOXYzine 50 MG tablet Commonly known as:  ATARAX/VISTARIL Take 50 mg by mouth every 6 (six) hours as needed for itching.   insulin glargine 100 UNIT/ML injection Commonly known as:  LANTUS Inject 8 Units into the skin every morning.   insulin lispro 100 UNIT/ML injection Commonly known as:  HUMALOG Inject 0-10 Units into the skin 3 (three) times daily as needed for high blood sugar (CBG >180). CBG 0-59 Hypoglycemic Protocol, 60-149 0 units 150-250 5 units, 251-300 8 units, 301-350 10 units, >350 call MD   lactose free nutrition Liqd Take 237 mLs by mouth daily. At 9PM   loperamide 2 MG capsule Commonly known as:  IMODIUM Take 1 capsule (2 mg total) by mouth 4 (four) times daily as needed for diarrhea or loose stools.   metoprolol succinate 25 MG 24 hr tablet Commonly known as:  TOPROL-XL Take 12.5 mg by mouth 2 (two) times daily. HOLD for SBP <110 and HP <60   mirtazapine 15 MG tablet Commonly known  as:  REMERON Take 15 mg by mouth at bedtime.   nitroGLYCERIN 0.4 MG SL tablet Commonly known as:  NITROSTAT Place 0.4 mg under the tongue every 5 (five) minutes as needed for chest pain.   oxyCODONE-acetaminophen 5-325 MG tablet Commonly known as:  PERCOCET/ROXICET Take 1-2 tablets by mouth every 6 (six) hours as needed for moderate pain or severe pain.   OXYGEN Inhale 2 L into the lungs as needed.   PROAIR HFA 108 (90 Base) MCG/ACT inhaler Generic drug:  albuterol Inhale 2 puffs into the lungs every 6 (six) hours as needed for wheezing or shortness of breath.   sertraline 25 MG tablet Commonly known as:  ZOLOFT Take 25 mg by mouth daily.   SYSTANE BALANCE OP Place 1 drop into both eyes 2 (two) times daily.       Review of Systems  Constitutional: Negative for activity change, appetite change, chills, fatigue and fever.  HENT: Negative for congestion, rhinorrhea, sinus pressure, sneezing and sore throat.   Eyes: Negative.   Respiratory: Negative for cough, chest tightness, shortness of breath and wheezing.   Cardiovascular: Negative for chest pain and palpitations.  Gastrointestinal: Positive for nausea and vomiting. Negative for abdominal distention, abdominal pain, constipation and diarrhea.  Endocrine: Negative for polydipsia, polyphagia and polyuria.  Genitourinary: Negative for dysuria, frequency and urgency.  Musculoskeletal: Positive for gait problem.  Skin: Negative for color change, pallor and rash.          Neurological: Negative for dizziness, seizures, syncope, light-headedness, numbness and headaches.  Hematological: Does not bruise/bleed easily.  Psychiatric/Behavioral: Positive for confusion. Negative for agitation, hallucinations, sleep disturbance and suicidal ideas. The patient is not nervous/anxious.     Immunization History  Administered Date(s) Administered  . Influenza, High Dose Seasonal PF 08/17/2016  . PPD Test 03/01/2016, 04/10/2016  .  Pneumococcal-Unspecified 01/16/2017   Pertinent  Health Maintenance Due  Topic Date Due  . URINE MICROALBUMIN  07/23/1960  . MAMMOGRAM  07/23/2000  . COLONOSCOPY  07/23/2000  . DEXA SCAN  07/24/2015  . HEMOGLOBIN A1C  06/24/2017  . FOOT EXAM  08/23/2017  . OPHTHALMOLOGY EXAM  01/08/2018  . PNA vac Low Risk Adult (2 of 2 - PCV13) 01/16/2018  . INFLUENZA VACCINE  Completed    Vitals:   02/16/17 1345  BP: (!) 87/52  Pulse: 71  Resp: 18  Temp: 97.9 F (36.6 C)  SpO2: 92%  Weight: 164 lb (74.4 kg)  Height:  5\' 8"  (1.727 m)   Body mass index is 24.94 kg/m. Physical Exam  Constitutional: She appears well-developed and well-nourished. No distress.  Intermittent confusion  HENT:  Head: Normocephalic.  Mouth/Throat: Oropharynx is clear and moist. No oropharyngeal exudate.  Eyes: Conjunctivae and EOM are normal. Pupils are equal, round, and reactive to light. Right eye exhibits no discharge. Left eye exhibits no discharge. No scleral icterus.  Neck: Normal range of motion. No JVD present. No thyromegaly present.  Cardiovascular: Normal rate, regular rhythm, normal heart sounds and intact distal pulses.  Exam reveals no gallop and no friction rub.   No murmur heard. Pulmonary/Chest: Effort normal and breath sounds normal. No respiratory distress. She has no wheezes. She has no rales.     Abdominal: Soft. Bowel sounds are normal. She exhibits no distension. There is no rebound and no guarding.  Umbilical Hernia reducible  Genitourinary:  Genitourinary Comments: On Hemo dialysis x 3 per week. Right upper chest Hemodialysis catheter site dry, clean and intact.   Musculoskeletal: She exhibits no edema, tenderness or deformity.  Moves x 4 extremities   Lymphadenopathy:    She has no cervical adenopathy.  Neurological:  Alert and oriented with intermittent confusion   Skin: Skin is warm and dry. No rash noted. No erythema. No pallor.  Left groin femoral graft site tender to touch  incision site dry, clean and intact.   Psychiatric: She has a normal mood and affect.    Labs reviewed:  Recent Labs  04/03/16 1423  05/08/16 0436  07/15/16 1800  07/22/16 1109  02/03/17 1600 02/06/17 0700 02/12/17 1023 02/12/17 1913  NA  --   < > 137  < > 135  < > 135  < > 138 136 139 139  K  --   < > 3.5  < > 4.3  < > 3.5  < > 3.7 3.4* 3.7 3.6  CL  --   < > 103  < > 97*  < > 102  < > 97* 100*  --  102  CO2  --   < > 25  < > 25  < > 24  < > 27 27  --  25  GLUCOSE  --   < > 76  < > 152*  < > 64*  < > 86 98 105* 86  BUN  --   < > 28*  < > 40*  < > 12  < > 35* 19  --  34*  CREATININE  --   < > 6.52*  < > 6.36*  < > 2.74*  < > 4.89* 4.70*  --  4.87*  CALCIUM  --   < > 8.0*  < > 8.5*  < > 8.2*  < > 8.3* 8.2*  --  8.3*  MG 2.1  --  1.9  --  2.2  --   --   --   --   --   --   --   PHOS  --   < >  --   < > 4.7*  < > 1.7*  --  5.3* 4.9*  --   --   < > = values in this interval not displayed.  Recent Labs  09/16/16 1232  01/19/17 02/01/17 1924 02/03/17 1600 02/06/17 0700 02/12/17 1913  AST 17  --  11* 15  --   --  12*  ALT 12*  --  6* 9*  --   --  7*  ALKPHOS  182*  < > 198* 199*  --   --  121  BILITOT 0.8  --   --  0.6  --   --  0.3  PROT 8.7*  --   --  8.3*  --   --  7.9  ALBUMIN 1.9*  --   --  1.8* 1.7* 1.7* 1.8*  < > = values in this interval not displayed.  Recent Labs  09/26/16  02/01/17 1924 02/03/17 1559 02/04/17 0830 02/06/17 0500 02/12/17 1023 02/12/17 1913  WBC 9.2  < > 10.7* 9.7 8.6 8.8  --  9.8  NEUTROABS 6  --  8.6* 7.4  --   --   --   --   HGB 9.4*  < > 9.2* 9.0* 9.4* 8.9* 12.6 9.2*  HCT 31*  < > 30.3* 30.1* 31.8* 30.3* 37.0 31.6*  MCV  --   --  81.7 81.4 81.3 81.7  --  83.4  PLT  --   < > 287 338 296 332  --  220  < > = values in this interval not displayed. Lab Results  Component Value Date   TSH 1.19 09/25/2016   Lab Results  Component Value Date   HGBA1C 5 12/25/2016   Lab Results  Component Value Date   CHOL 147 12/25/2016   HDL 40  12/25/2016   LDLCALC 90 12/25/2016   TRIG 87 12/25/2016    Assessment/Plan 1. Altered mental status Afebrile. Has had intermittent confusion suspect possible due to her hypoglycemia with poor oral intake.Will send to ER for evaluation.   2. Hypotension B/P running in the 80's/50's.will send to ER for evaluation.   3. Decreased oral intake Has had poor oral intake. Send to ER for possible dehydration.   4. Nausea and vomiting status post hospital admission from 02/01/17-02/06/17 with acute abdominal pain. She was diagnosed with acute cholecystitis and had a gall bladder drain placement by IR for conservative management.Has had > 600 ml over 24 Hrs.Send to ER for evaluation.   5. Hypoglycemia CBG check 57 unable to tolerate oral intake. Send to ER   Family/ staff Communication: Reviewed plan of care with patient and facility Nurse supervisor  Labs/tests ordered: None patient send to ER for decline in condition and altered mental status.   Caesar Bookman, NP

## 2017-02-16 NOTE — ED Notes (Signed)
Denies fever, vomiting, or diarrhea.

## 2017-02-17 ENCOUNTER — Encounter (HOSPITAL_COMMUNITY): Payer: Self-pay | Admitting: Emergency Medicine

## 2017-02-17 DIAGNOSIS — R0789 Other chest pain: Secondary | ICD-10-CM | POA: Diagnosis not present

## 2017-02-17 DIAGNOSIS — R579 Shock, unspecified: Secondary | ICD-10-CM

## 2017-02-17 DIAGNOSIS — N186 End stage renal disease: Secondary | ICD-10-CM | POA: Diagnosis present

## 2017-02-17 DIAGNOSIS — I255 Ischemic cardiomyopathy: Secondary | ICD-10-CM | POA: Diagnosis present

## 2017-02-17 DIAGNOSIS — Z955 Presence of coronary angioplasty implant and graft: Secondary | ICD-10-CM | POA: Diagnosis not present

## 2017-02-17 DIAGNOSIS — K8018 Calculus of gallbladder with other cholecystitis without obstruction: Secondary | ICD-10-CM | POA: Diagnosis present

## 2017-02-17 DIAGNOSIS — K819 Cholecystitis, unspecified: Secondary | ICD-10-CM | POA: Diagnosis not present

## 2017-02-17 DIAGNOSIS — E11610 Type 2 diabetes mellitus with diabetic neuropathic arthropathy: Secondary | ICD-10-CM | POA: Diagnosis present

## 2017-02-17 DIAGNOSIS — R569 Unspecified convulsions: Secondary | ICD-10-CM | POA: Diagnosis not present

## 2017-02-17 DIAGNOSIS — K810 Acute cholecystitis: Secondary | ICD-10-CM | POA: Diagnosis not present

## 2017-02-17 DIAGNOSIS — G4733 Obstructive sleep apnea (adult) (pediatric): Secondary | ICD-10-CM | POA: Diagnosis present

## 2017-02-17 DIAGNOSIS — F419 Anxiety disorder, unspecified: Secondary | ICD-10-CM | POA: Diagnosis present

## 2017-02-17 DIAGNOSIS — N185 Chronic kidney disease, stage 5: Secondary | ICD-10-CM | POA: Diagnosis not present

## 2017-02-17 DIAGNOSIS — J449 Chronic obstructive pulmonary disease, unspecified: Secondary | ICD-10-CM | POA: Diagnosis present

## 2017-02-17 DIAGNOSIS — D631 Anemia in chronic kidney disease: Secondary | ICD-10-CM | POA: Diagnosis not present

## 2017-02-17 DIAGNOSIS — I428 Other cardiomyopathies: Secondary | ICD-10-CM | POA: Diagnosis present

## 2017-02-17 DIAGNOSIS — Z9689 Presence of other specified functional implants: Secondary | ICD-10-CM | POA: Diagnosis present

## 2017-02-17 DIAGNOSIS — I2510 Atherosclerotic heart disease of native coronary artery without angina pectoris: Secondary | ICD-10-CM | POA: Diagnosis present

## 2017-02-17 DIAGNOSIS — I132 Hypertensive heart and chronic kidney disease with heart failure and with stage 5 chronic kidney disease, or end stage renal disease: Secondary | ICD-10-CM | POA: Diagnosis present

## 2017-02-17 DIAGNOSIS — E1122 Type 2 diabetes mellitus with diabetic chronic kidney disease: Secondary | ICD-10-CM | POA: Diagnosis present

## 2017-02-17 DIAGNOSIS — I959 Hypotension, unspecified: Secondary | ICD-10-CM | POA: Diagnosis present

## 2017-02-17 DIAGNOSIS — I480 Paroxysmal atrial fibrillation: Secondary | ICD-10-CM | POA: Diagnosis present

## 2017-02-17 DIAGNOSIS — N2581 Secondary hyperparathyroidism of renal origin: Secondary | ICD-10-CM | POA: Diagnosis present

## 2017-02-17 DIAGNOSIS — A419 Sepsis, unspecified organism: Secondary | ICD-10-CM | POA: Diagnosis present

## 2017-02-17 DIAGNOSIS — E46 Unspecified protein-calorie malnutrition: Secondary | ICD-10-CM | POA: Diagnosis present

## 2017-02-17 DIAGNOSIS — E43 Unspecified severe protein-calorie malnutrition: Secondary | ICD-10-CM | POA: Diagnosis present

## 2017-02-17 DIAGNOSIS — G9340 Encephalopathy, unspecified: Secondary | ICD-10-CM | POA: Diagnosis not present

## 2017-02-17 DIAGNOSIS — E1140 Type 2 diabetes mellitus with diabetic neuropathy, unspecified: Secondary | ICD-10-CM | POA: Diagnosis present

## 2017-02-17 DIAGNOSIS — R571 Hypovolemic shock: Secondary | ICD-10-CM | POA: Diagnosis present

## 2017-02-17 DIAGNOSIS — I5042 Chronic combined systolic (congestive) and diastolic (congestive) heart failure: Secondary | ICD-10-CM | POA: Diagnosis present

## 2017-02-17 DIAGNOSIS — R55 Syncope and collapse: Secondary | ICD-10-CM | POA: Diagnosis not present

## 2017-02-17 DIAGNOSIS — Z95810 Presence of automatic (implantable) cardiac defibrillator: Secondary | ICD-10-CM | POA: Diagnosis not present

## 2017-02-17 DIAGNOSIS — E8889 Other specified metabolic disorders: Secondary | ICD-10-CM | POA: Diagnosis present

## 2017-02-17 LAB — GLUCOSE, CAPILLARY
GLUCOSE-CAPILLARY: 54 mg/dL — AB (ref 65–99)
GLUCOSE-CAPILLARY: 168 mg/dL — AB (ref 65–99)
GLUCOSE-CAPILLARY: 109 mg/dL — AB (ref 65–99)
Glucose-Capillary: 108 mg/dL — ABNORMAL HIGH (ref 65–99)
Glucose-Capillary: 105 mg/dL — ABNORMAL HIGH (ref 65–99)
Glucose-Capillary: 104 mg/dL — ABNORMAL HIGH (ref 65–99)

## 2017-02-17 LAB — MAGNESIUM: Magnesium: 2 mg/dL (ref 1.7–2.4)

## 2017-02-17 LAB — BASIC METABOLIC PANEL
ANION GAP: 18 — AB (ref 5–15)
BUN: 25 mg/dL — ABNORMAL HIGH (ref 6–20)
CHLORIDE: 91 mmol/L — AB (ref 101–111)
CO2: 24 mmol/L (ref 22–32)
Calcium: 8.1 mg/dL — ABNORMAL LOW (ref 8.9–10.3)
Creatinine, Ser: 4.31 mg/dL — ABNORMAL HIGH (ref 0.44–1.00)
GFR calc Af Amer: 11 mL/min — ABNORMAL LOW (ref >60)
GFR calc non Af Amer: 10 mL/min — ABNORMAL LOW (ref >60)
GLUCOSE: 157 mg/dL — AB (ref 65–99)
POTASSIUM: 3.7 mmol/L (ref 3.5–5.1)
Sodium: 133 mmol/L — ABNORMAL LOW (ref 135–145)

## 2017-02-17 LAB — CBC WITH DIFFERENTIAL/PLATELET
BASOS ABS: 0 10*3/uL (ref 0.0–0.1)
Basophils Relative: 0 %
Eosinophils Absolute: 0.1 10*3/uL (ref 0.0–0.7)
Eosinophils Relative: 1 %
HEMATOCRIT: 31.1 % — AB (ref 36.0–46.0)
HEMOGLOBIN: 9.2 g/dL — AB (ref 12.0–15.0)
LYMPHS PCT: 11 %
Lymphs Abs: 1.2 10*3/uL (ref 0.7–4.0)
MCH: 24.1 pg — ABNORMAL LOW (ref 26.0–34.0)
MCHC: 29.6 g/dL — ABNORMAL LOW (ref 30.0–36.0)
MCV: 81.6 fL (ref 78.0–100.0)
MONO ABS: 0.6 10*3/uL (ref 0.1–1.0)
Monocytes Relative: 6 %
NEUTROS ABS: 9 10*3/uL — AB (ref 1.7–7.7)
Neutrophils Relative %: 82 %
Platelets: 291 10*3/uL (ref 150–400)
RBC: 3.81 MIL/uL — AB (ref 3.87–5.11)
RDW: 17.1 % — ABNORMAL HIGH (ref 11.5–15.5)
WBC: 11 10*3/uL — ABNORMAL HIGH (ref 4.0–10.5)

## 2017-02-17 LAB — IRON AND TIBC
IRON: 25 ug/dL — AB (ref 28–170)
Saturation Ratios: 18 % (ref 10.4–31.8)
TIBC: 143 ug/dL — AB (ref 250–450)
UIBC: 118 ug/dL

## 2017-02-17 LAB — PROCALCITONIN: Procalcitonin: 3.26 ng/mL

## 2017-02-17 LAB — PHOSPHORUS: Phosphorus: 6.4 mg/dL — ABNORMAL HIGH (ref 2.5–4.6)

## 2017-02-17 MED ORDER — SODIUM CHLORIDE 0.9 % IV SOLN: 100.0000 mL | INTRAVENOUS | Status: DC | PRN

## 2017-02-17 MED ORDER — NEPRO/CARBSTEADY PO LIQD
237.0000 mL | Freq: Two times a day (BID) | ORAL | Status: DC
  Administered 2017-02-17 – 2017-02-22 (×5): 237 mL via ORAL
  Filled 2017-02-17 (×6): qty 237

## 2017-02-17 MED ORDER — ALBUMIN HUMAN 25 % IV SOLN
25.0000 g | Freq: Once | INTRAVENOUS | Status: AC
  Administered 2017-02-17: 25 g via INTRAVENOUS

## 2017-02-17 MED ORDER — DEXTROSE 50 % IV SOLN
INTRAVENOUS | Status: AC
  Administered 2017-02-17: 50 mL
  Filled 2017-02-17: qty 50

## 2017-02-17 MED ORDER — HEPARIN SODIUM (PORCINE) 1000 UNIT/ML DIALYSIS: 20.0000 [IU]/kg | INTRAMUSCULAR | Status: DC | PRN

## 2017-02-17 MED ORDER — DARBEPOETIN ALFA 200 MCG/0.4ML IJ SOSY
200.0000 ug | PREFILLED_SYRINGE | INTRAMUSCULAR | Status: DC
  Administered 2017-02-17: 200 ug via INTRAVENOUS
  Filled 2017-02-17: qty 0.4

## 2017-02-17 MED ORDER — SODIUM CHLORIDE 0.9 % IV BOLUS (SEPSIS)
500.0000 mL | Freq: Once | INTRAVENOUS | Status: AC
  Administered 2017-02-17: 500 mL via INTRAVENOUS

## 2017-02-17 MED ORDER — ALTEPLASE 2 MG IJ SOLR
2.0000 mg | Freq: Once | INTRAMUSCULAR | Status: DC | PRN
  Filled 2017-02-17: qty 2

## 2017-02-17 MED ORDER — SODIUM CHLORIDE 0.9 % IV BOLUS (SEPSIS): 1000.0000 mL | Freq: Once | INTRAVENOUS | Status: DC

## 2017-02-17 MED ORDER — ALBUMIN HUMAN 25 % IV SOLN
INTRAVENOUS | Status: AC
  Administered 2017-02-17: 25 g via INTRAVENOUS
  Filled 2017-02-17: qty 100

## 2017-02-17 MED ORDER — HEPARIN SODIUM (PORCINE) 5000 UNIT/ML IJ SOLN
5000.0000 [IU] | Freq: Three times a day (TID) | INTRAMUSCULAR | Status: DC
  Administered 2017-02-17 – 2017-02-23 (×18): 5000 [IU] via SUBCUTANEOUS
  Filled 2017-02-17 (×17): qty 1

## 2017-02-17 MED ORDER — LIDOCAINE-PRILOCAINE 2.5-2.5 % EX CREA
1.0000 "application " | TOPICAL_CREAM | CUTANEOUS | Status: DC | PRN
  Filled 2017-02-17: qty 5

## 2017-02-17 MED ORDER — NOREPINEPHRINE BITARTRATE 1 MG/ML IV SOLN
0.0000 ug/min | INTRAVENOUS | Status: DC
  Filled 2017-02-17: qty 16

## 2017-02-17 MED ORDER — HEPARIN SODIUM (PORCINE) 1000 UNIT/ML DIALYSIS: 1000.0000 [IU] | INTRAMUSCULAR | Status: DC | PRN

## 2017-02-17 MED ORDER — MOMETASONE FURO-FORMOTEROL FUM 200-5 MCG/ACT IN AERO
2.0000 | INHALATION_SPRAY | Freq: Two times a day (BID) | RESPIRATORY_TRACT | Status: DC
  Administered 2017-02-17 – 2017-02-23 (×12): 2 via RESPIRATORY_TRACT
  Filled 2017-02-17 (×2): qty 8.8

## 2017-02-17 MED ORDER — ENOXAPARIN SODIUM 60 MG/0.6ML ~~LOC~~ SOLN: 1.0000 mg/kg | SUBCUTANEOUS | Status: DC

## 2017-02-17 MED ORDER — PRO-STAT SUGAR FREE PO LIQD
30.0000 mL | Freq: Two times a day (BID) | ORAL | Status: DC
  Administered 2017-02-17 (×2): 30 mL via ORAL
  Filled 2017-02-17 (×3): qty 30

## 2017-02-17 MED ORDER — SODIUM CHLORIDE 0.9 % IV SOLN
250.0000 mL | INTRAVENOUS | Status: DC | PRN
  Administered 2017-02-17: 250 mL via INTRAVENOUS

## 2017-02-17 MED ORDER — PENTAFLUOROPROP-TETRAFLUOROETH EX AERO: 1.0000 "application " | INHALATION_SPRAY | CUTANEOUS | Status: DC | PRN

## 2017-02-17 MED ORDER — NOREPINEPHRINE BITARTRATE 1 MG/ML IV SOLN
0.0000 ug/min | INTRAVENOUS | Status: DC
  Administered 2017-02-17: 10 ug/min via INTRAVENOUS
  Filled 2017-02-17: qty 16

## 2017-02-17 MED ORDER — RENA-VITE PO TABS
1.0000 | ORAL_TABLET | Freq: Every day | ORAL | Status: DC
  Administered 2017-02-17 – 2017-02-20 (×3): 1 via ORAL
  Filled 2017-02-17 (×7): qty 1

## 2017-02-17 MED ORDER — DARBEPOETIN ALFA 200 MCG/0.4ML IJ SOSY
PREFILLED_SYRINGE | INTRAMUSCULAR | Status: AC
  Administered 2017-02-17: 200 ug via INTRAVENOUS
  Filled 2017-02-17: qty 0.4

## 2017-02-17 MED ORDER — LIDOCAINE HCL (PF) 1 % IJ SOLN: 5.0000 mL | INTRAMUSCULAR | Status: DC | PRN

## 2017-02-17 MED ORDER — ENOXAPARIN SODIUM 80 MG/0.8ML ~~LOC~~ SOLN
1.0000 mg/kg | SUBCUTANEOUS | Status: DC
  Filled 2017-02-17: qty 0.75

## 2017-02-17 MED ORDER — TIOTROPIUM BROMIDE MONOHYDRATE 18 MCG IN CAPS
18.0000 ug | ORAL_CAPSULE | Freq: Every day | RESPIRATORY_TRACT | Status: DC
  Administered 2017-02-17 – 2017-02-23 (×6): 18 ug via RESPIRATORY_TRACT
  Filled 2017-02-17 (×2): qty 5

## 2017-02-17 NOTE — Consult Note (Addendum)
[facility] Renal Consultation Note    Indication for Consultation:  Management of ESRD/hemodialysis; anemia, hypertension/volume and secondary hyperparathyroidism PCP: currently Dr. Glade Lloyd at [facility]  HPI: Brandy Dudley is a 66 y.o. female with ESRD on TTS HD who was recently admitted 3/8 - 3/14 s/p cholecystostomy tube placed for acute cholecystitis .  She had been doing well and had an uneventful dialysis Thursday when she returned to [facility].  She said she woke up disoriented, feeling bad and she was brought to the ED.  She had a new left thigh AVGG placed 3/19 by Dr. Randie Heinz.Marland Kitchen She was discharged back to [facility]. She did not go to dialysis Tuesday but came Thursday 3/22 and was only 0.6 above her EDW.  When initially assessed Thursday she told the RN she didn't feel well BS was 63 and she was treated with 1 amp of D50.  Repeat BS 15 minutes later was 253.  There were no post HD abnormal findings noted by the dialysis RN.    Today, she is breathing easily.  Her new thigh graft is sore.  Her abdominal pain has been mostly left upper abdomen with nausea but no vomiting.She makes small amounts of urine.  She had a BM yesterday. She is having good biliary drainage with minimal discomfort from tube.  She is requesting help with positioning so she can eat breakfast. Labs upon admission showed a WBC of 9.4 with normal diffup to 11 today, chemistries are unremarkable, BNP 2422, trop 0.04, lactic acid normal  hgb 8.6 down after thigh graft placement (not surprising), TSH 0.64. BC pending. CXR shows some vascular congestion.  Past Medical History:  Diagnosis Date  . AICD (automatic cardioverter/defibrillator) present    Biotronik Inventra 7 VR-T DX 04/12/15  . Anemia   . Anxiety   . C. difficile colitis   . Cardiomyopathy (HCC)    02/02/16 ([facility]): Mixed ischemic and non-ischemic cardiomyopathy  . Charcot's joint of left foot   . CHF (congestive heart failure) (HCC)     systolic  . Cholelithiases 01/2017  . Complication of anesthesia 02/02/2017   has anxeity when awaken with gallbladder surgery  . COPD (chronic obstructive pulmonary disease) (HCC)   . Coronary artery disease   . Diabetes mellitus (HCC)    Type II  . DVT (deep venous thrombosis) (HCC)    on coumadin  . ESRD (end stage renal disease) (HCC)    tues/thurs/sat dialysis  . Hypertension   . Neuropathy (HCC)   . Obstructive sleep apnea    no cpap  . Renal disorder   . Wears glasses    Past Surgical History:  Procedure Laterality Date  . AV FISTULA PLACEMENT Left 04/03/2016   Procedure: ARTERIOVENOUS (AV) FISTULA CREATION;  Surgeon: Larina Earthly, MD;  Location: [facility] OR;  Service: Vascular;  Laterality: Left;  . AV FISTULA PLACEMENT Right 08/21/2016   Procedure: INSERTION RIGHT ARM ARTERIOVENOUS GRAFT USING 4-7MM X45  CM ACUSEAL GRAFT;  Surgeon: Maeola Harman, MD;  Location: [facility] OR;  Service: Vascular;  Laterality: Right;  . AV FISTULA PLACEMENT Left 02/12/2017   Procedure: INSERTION OF ARTERIOVENOUS (AV) GORE-TEX GRAFT THIGH;  Surgeon: Maeola Harman, MD;  Location: [facility] OR;  Service: Vascular;  Laterality: Left;  Using Goretex 4-26mm Vascular Graft  . CORONARY ANGIOPLASTY     LCX stent 2010; by [facility] 01/2016 note: 03/2014 LHC/RHC: minor luminal irregularities LAD and RCA with patent stent first marginal branch of CX.  RA 11, PAP 58/22 with mean 39, mean wedge pressure 19, LVEDP 27, PVR 3.9, CO 5.1, Cardiac index 2.5.   . FISTULA SUPERFICIALIZATION Left 06/16/2016   Procedure: SUPERFICIALIZATION OF LEFT ARM BRACHIOCEPHALIC ARTERIOVENOUS FISTULA;  Surgeon: Chuck Hint, MD;  Location: [facility] OR;  Service: Vascular;  Laterality: Left;  . INSERTION OF DIALYSIS CATHETER Left 04/03/2016   Procedure: INSERTION OF DIALYSIS CATHETER;  Surgeon: Larina Earthly, MD;  Location: [facility] OR;  Service: Vascular;  Laterality: Left;  . IR GENERIC HISTORICAL  02/02/2017   IR PERC CHOLECYSTOSTOMY  02/02/2017 Malachy Moan, MD [facility]-INTERV RAD  . LIGATION OF COMPETING BRANCHES OF ARTERIOVENOUS FISTULA Left 06/16/2016   Procedure: LIGATION OF COMPETING BRANCHES OF left brachiocephalic ARTERIOVENOUS FISTULA;  Surgeon: Chuck Hint, MD;  Location: [facility] OR;  Service: Vascular;  Laterality: Left;  . OTHER SURGICAL HISTORY  02/02/2017   Transhepatic percutaneous cholecystostomy placement  . PERIPHERAL VASCULAR CATHETERIZATION N/A 07/20/2016   Procedure: A/V Shuntogram/Fistulagram;  Surgeon: Fransisco Hertz, MD;  Location: [facility] INVASIVE CV LAB;  Service: Cardiovascular;  Laterality: N/A;  . PERIPHERAL VASCULAR CATHETERIZATION N/A 07/20/2016   Procedure: Dialysis/Perma Catheter Insertion;  Surgeon: Fransisco Hertz, MD;  Location: [facility] INVASIVE CV LAB;  Service: Cardiovascular;  Laterality: N/A;  . REVISION OF ARTERIOVENOUS GORETEX GRAFT Left 06/16/2016   Procedure: REVISION OF LEFT BRACHIOCEPHALIC ARTERIOVENOUS FISTULA WITH RESECTION OF REDUNDANT SECTION;  Surgeon: Chuck Hint, MD;  Location: [facility] OR;  Service: Vascular;  Laterality: Left;   No family history on file. Social History:  reports that she quit smoking about 10 years ago. Her smoking use included Cigarettes. She has never used smokeless tobacco. She reports that she does not drink alcohol or use drugs. Allergies  Allergen Reactions  . Entresto [Sacubitril-Valsartan] Other (See Comments)     unknown reaction  . Latex Itching  . Sulfa Antibiotics Itching   Prior to Admission medications   Medication Sig Start Date End Date Taking? Authorizing Provider  acetaminophen (TYLENOL) 325 MG tablet Take 325 mg by mouth every 6 (six) hours as needed for mild pain or fever (For fever >99.5). Take 650 mg by mouth q6h prn for mild pain   Yes Historical Provider, MD  albuterol (PROAIR HFA) 108 (90 Base) MCG/ACT inhaler Inhale 2 puffs into the lungs every 6 (six) hours as needed for wheezing or shortness of breath.    Yes Historical Provider, MD   amiodarone (PACERONE) 200 MG tablet Take 1 tablet (200 mg total) by mouth daily. 10/11/16  Yes Marinus Maw, MD  atorvastatin (LIPITOR) 10 MG tablet Take 10 mg by mouth at bedtime.    Yes Historical Provider, MD  famotidine (PEPCID) 20 MG tablet Take 1 tablet (20 mg total) by mouth at bedtime. 07/22/16  Yes Maryann Mikhail, DO  fluticasone (FLOVENT HFA) 110 MCG/ACT inhaler Inhale 2 puffs into the lungs 2 (two) times daily.    Yes Historical Provider, MD  hydrOXYzine (ATARAX/VISTARIL) 50 MG tablet Take 50 mg by mouth every 6 (six) hours as needed for itching.   Yes Historical Provider, MD  insulin glargine (LANTUS) 100 UNIT/ML injection Inject 8 Units into the skin every morning.    Yes Historical Provider, MD  insulin lispro (HUMALOG) 100 UNIT/ML injection Inject 0-10 Units into the skin 3 (three) times daily as needed for high blood sugar (CBG >180). CBG 0-59 Hypoglycemic Protocol, 60-149 0 units 150-250 5 units, 251-300 8 units, 301-350 10 units, >350 call MD   Yes Historical Provider, MD  Inulin (FIBER CHOICE PO) Take 2 tablets by mouth daily as needed (Constipation).    Yes Historical Provider, MD  lactose free nutrition (BOOST PLUS) LIQD Take 237 mLs by mouth daily.    Yes Historical Provider, MD  loperamide (IMODIUM) 2 MG capsule Take 1 capsule (2 mg total) by mouth 4 (four) times daily as needed for diarrhea or loose stools. 09/16/16  Yes Jacalyn Lefevre, MD  metoprolol succinate (TOPROL-XL) 25 MG 24 hr tablet Take 12.5 mg by mouth 2 (two) times daily. HOLD for SBP <110 and HP <60   Yes Historical Provider, MD  mirtazapine (REMERON) 15 MG tablet Take 15 mg by mouth at bedtime.   Yes Historical Provider, MD  Multiple Vitamins-Minerals (CERTAVITE/ANTIOXIDANTS) TABS Take 1 tablet by mouth at bedtime.   Yes Historical Provider, MD  nitroGLYCERIN (NITROSTAT) 0.4 MG SL tablet Place 0.4 mg under the tongue every 5 (five) minutes as needed for chest pain.   Yes Historical Provider, MD  Nutritional  Supplements (ENSURE CLEAR) LIQD Take 1 each by mouth daily. For 14 days   Yes Historical Provider, MD  oxyCODONE-acetaminophen (PERCOCET/ROXICET) 5-325 MG tablet Take 1-2 tablets by mouth every 6 (six) hours as needed for moderate pain or severe pain.   Yes Historical Provider, MD  Propylene Glycol (SYSTANE BALANCE) 0.6 % SOLN Place 1 drop into both eyes 2 (two) times daily.   Yes Historical Provider, MD  sertraline (ZOLOFT) 25 MG tablet Take 25 mg by mouth daily.    Yes Historical Provider, MD  OXYGEN Inhale 2 L into the lungs as needed.    Historical Provider, MD   Current Facility-Administered Medications  Medication Dose Route Frequency Provider Last Rate Last Dose  . 0.9 %  sodium chloride infusion  250 mL Intravenous PRN Elveria Rising, DO 10 mL/hr at 02/17/17 0356 250 mL at 02/17/17 0356  . heparin injection 5,000 Units  5,000 Units Subcutaneous Q8H Leslye Peer, MD      . mometasone-formoterol [facility]) 200-5 MCG/ACT inhaler 2 puff  2 puff Inhalation BID Elveria Rising, DO   2 puff at 02/17/17 [phone]  . norepinephrine (LEVOPHED) 16 mg in dextrose 5 % 250 mL (0.064 mg/mL) infusion  0-40 mcg/min Intravenous Titrated Dorise Hiss Deterding, MD 5.6 mL/hr at 02/17/17 1000 6 mcg/min at 02/17/17 1000  . piperacillin-tazobactam (ZOSYN) IVPB 3.375 g  3.375 g Intravenous Q12H Mahala Menghini, RPH 12.5 mL/hr at 02/17/17 0306 3.375 g at 02/17/17 0306  . sodium chloride 0.9 % bolus 1,000 mL  1,000 mL Intravenous Once [employer], DO      . sodium chloride 0.9 % bolus 500 mL  500 mL Intravenous Once Leslye Peer, MD      . tiotropium [facility]) inhalation capsule 18 mcg  18 mcg Inhalation Daily Elveria Rising, DO   18 mcg at 02/17/17 9528   Labs: Basic Metabolic Panel:  Recent Labs Lab 02/12/17 1913 02/16/17 1644 02/17/17 0356  NA 139 135 133*  K 3.6 4.1 3.7  CL 102 91* 91*  CO2 25 24 24   GLUCOSE 86 76 157*  BUN 34* 21* 25*  CREATININE 4.87* 3.91* 4.31*   CALCIUM 8.3* 8.0* 8.1*  PHOS  --   --  6.4*   Liver Function Tests:  Recent Labs Lab 02/12/17 1913 02/16/17 1644  AST 12* 16  ALT 7* 7*  ALKPHOS 121 141*  BILITOT 0.3 1.2  PROT 7.9 8.3*  ALBUMIN 1.8* 1.6*    Recent Labs Lab  02/16/17 1644  LIPASE <10*   No results for input(s): AMMONIA in the last 168 hours. CBC:  Recent Labs Lab 02/12/17 1913 02/16/17 1644 02/17/17 0356  WBC 9.8 9.4 11.0*  NEUTROABS  --  7.1 9.0*  HGB 9.2* 8.6* 9.2*  HCT 31.6* 29.7* 31.1*  MCV 83.4 82.3 81.6  PLT 220 264 291   Cardiac Enzymes: No results for input(s): CKTOTAL, CKMB, CKMBINDEX, TROPONINI in the last 168 hours. CBG:  Recent Labs Lab 02/13/17 1205 02/13/17 1700 02/17/17 0217 02/17/17 0351 02/17/17 0732  GLUCAP 79 73 54* 168* 108*   Iron Studies: No results for input(s): IRON, TIBC, TRANSFERRIN, FERRITIN in the last 72 hours. Studies/Results: Dg Chest 2 View  Result Date: 02/16/2017 CLINICAL DATA:  Confusion and hypotension EXAM: CHEST  2 VIEW COMPARISON:  09/16/2016 FINDINGS: Dialysis catheter is noted in the right jugular vein in satisfactory position. This is new from the prior exam. A defibrillator is again seen and stable. Cardiomegaly is again noted. Mild vascular congestion is seen likely of a chronic nature. No focal infiltrate or sizable effusion is seen. IMPRESSION: Mild vascular congestion which may be related to volume overload. No other focal abnormality is seen. Electronically Signed   By: Alcide Clever M.D.   On: 02/16/2017 16:13   Ct Abdomen Pelvis W Contrast  Result Date: 02/16/2017 CLINICAL DATA:  66 year old female with multiple medical comorbidities status post transhepatic percutaneous cholecystostomy tube on 02/02/2017. Abdominal pain and hypotension. Initial encounter. EXAM: CT ABDOMEN AND PELVIS WITH CONTRAST TECHNIQUE: Multidetector CT imaging of the abdomen and pelvis was performed using the standard protocol following bolus administration of  intravenous contrast. CONTRAST:  ISOVUE-300 IOPAMIDOL (ISOVUE-300) INJECTION 61%, however, it appears the vast majority of a contrast dose extravasated into the left upper extremity as seen on coronal image 80. COMPARISON:  CT Abdomen and Pelvis 02/01/2017, and earlier. FINDINGS: Essentially this is a noncontrast exam may in light of the IV extravasation into the left upper extremity. Lower chest: Left lower chest wall cardiac AICD type device with streak artifact. Severe cardiomegaly. No pericardial effusion. Continued medial left lower lobe atelectasis or consolidation with superimposed calcified granuloma. Interval improved ventilation at the right lung base with residual peribronchial atelectasis. Virtually resolved bilateral small pleural effusions. Hepatobiliary: Transhepatic percutaneous cholecystostomy tube in place with decompressed gallbladder. No biliary ductal enlargement. No discrete liver lesion. Pancreas: Negative. Spleen: Negative. Adrenals/Urinary Tract: Mild bilateral adrenal gland thickening compatible with hyperplasia stable. The kidneys appears stable without hydronephrosis or perinephric stranding. Diminutive, unremarkable urinary bladder. No abdominal free fluid. Stomach/Bowel: Decreased stool ball in the rectum but increased circumferential rectal wall thickening, up to 19 mm (series 21, image 69). Similar retained stool in the sigmoid colon and left colon. Similar retained stool in the transverse colon and right colon. Negative appendix (series 21, image 61). No dilated small bowel. Negative stomach and duodenum. Vascular/Lymphatic: Extensive Aortoiliac calcified atherosclerosis. Partially visible left femoral region dialysis graft. Poor vascular contrast bolus. Vascular patency not evaluated. Reproductive: Negative. Other: No pelvic free fluid. Rectus muscle diastases with small developing fat and small bowel containing ventral abdominal hernia, non incarcerated. See sagittal image 79.  Musculoskeletal: Chronic severe rose to changes throughout the visible lower thoracic spine to the T12 level, not new but progressed since September 2017. However, these bony changes are new since June of 2017. The superior L1 vertebral body now is being eroded, was intact in September. The erosive changes are accompanied by partially visible pedicle fractures at T7. The  visible lower ribs remain intact. Severe lower lumbar facet arthropathy. Pelvis and proximal femurs intact. IMPRESSION: 1. Extravasation of the contrast for this study into the patient left upper extremity near the antecubital fossa. Recommend physical exam evaluation of the IV site, elevation of the extremity, and ice application. Recommend surveillance for progressive skin changes, which if detected should prompt plastic surgery consultation. 2. Progressively abnormal appearance of the visualized thoracic spine with unusual vertebral body erosion which has developed since June of last year. New erosion of the superior T1 vertebral body since September. A pathologic fracture of T7 is suspected but incompletely visualized. 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 3. Right transhepatic percutaneous cholecystostomy tube with no adverse features. 4. Decreased rectal fecal impaction but circumferential rectal wall thickening suggestive of proctitis. However, no other bowel inflammation. No bowel obstruction. 5. Resolved small bilateral pleural effusions since earlier this month. Residual lower lobe atelectasis. 6. Severe cardiomegaly. Extensive calcified aortoiliac atherosclerosis. 7. Salient findings on this exam were discussed by telephone with Dr. Drema Pry on 02/16/2017 at 1930 hours. Electronically Signed   By: Odessa Fleming M.D.   On: 02/16/2017 19:34   Dg Chest Portable 1 View  Result Date: 02/16/2017 CLINICAL DATA:  Central line placement.  Initial encounter.  EXAM: PORTABLE CHEST 1 VIEW COMPARISON:  Chest radiograph performed earlier today at 4:02 p.m. FINDINGS: The lungs are well-aerated. Mild vascular congestion is noted. Increased interstitial markings may reflect mild interstitial edema. There is no evidence of pleural effusion or pneumothorax. The cardiomediastinal silhouette is enlarged. A right-sided dual-lumen catheter is noted ending at the right atrium. An AICD is noted overlying the left chest wall, with a lead ending overlying the right ventricle. No acute osseous abnormalities are seen. A left IJ line is noted ending about the distal SVC. IMPRESSION: 1. Left IJ line noted ending about the distal SVC. 2. Mild vascular congestion and cardiomegaly noted. Increased interstitial markings raise concern for mild interstitial edema. Electronically Signed   By: Roanna Raider M.D.   On: 02/16/2017 23:52    ROS: As per HPI otherwise negative.  Physical Exam: Vitals:   02/17/17 0800 02/17/17 0900 02/17/17 0906 02/17/17 1000  BP: (!) 107/40 102/63  (!) 111/56  Pulse: 73 75  74  Resp: 14 16  19   Temp:      TempSrc:      SpO2: 100% 100% 100% 100%  Weight:      Height:         General:  chronically ill elderly lady under lots of blankets-  Head: NCAT sclera not icteric MMM Neck: Supple.  Lungs: CTA bilat anteriorly Heart: RRR with S1 S2.  Abdomen: soft T tube drainage bag ~1/2 full LUQ tenderness esp under ribs area Lower extremities: tr LE edema SCDs in place, no open wounds  Neuro: A & O  X 3. Moves all extremities spontaneously. Psych:  Responds to questions appropriately with a normal affect recognizes me Dialysis Access: left thigh AVGG + bruit - no overt drainage at groin incision site/area quite moist and dressing peeling off right IJ no drainage at cath exit site L thigh aVG site clean and dry   Dialysis Orders:  [facility] - 4 hr 400/800 EDW 67 2 K 2.25 Ca profile 2 right IJ and left thigh AVGG placed 3/19 heparin 6000 no hecterol Fe 50  mircera 225 3/8 -  Recent labs: hgb 9.8 3/15 19% sat  in Feb - had course of Fe ipTH 158  Assessment/Plan: 1. LUQ pain-? Sepsis/hypotension upon admission -tmax 99.2 - no overt etiology, pt cultured - empiric abtx 2. s/p recent cholecystotomy tube 3/9- draining well 3. ESRD -  TTS - HD today; discussed with RN - ok to go to dialysis unit; new left thigh AVGG placed earlier this week - discussed groin care with RN 4. BP/volume  -hypotensive upon presentation - rechecked - trending up without sig IVF;Levophed discontinued;  BP not elevated at baseline - minimal IDWG - suspect weight loss and needs EDW lowered-on MTP XL 25 5. Anemia  - hgb drop 9.8 3/15 to 9.2   - redose ESA, recheck Fe  6. Metabolic bone disease -  No need for VDRA yet 7. Malnutrition - renal carb mod/add nepro/prostat/vits- alb low 8. Hx DVT 9. HX COPD 10. CAD 11. PAF - on amiodarone/MTP 25 XL- as outpt 12. DM - off insulin at present  Sheffield Slider, PA-C [facility] Beeper [phone] 02/17/2017, 10:33 AM   Pt seen, examined, agree w assess/plan as above with additions as indicated. ESRD pt with hypotension, abd pain and recent chole tube placed for acute cholecystitis.  Needs HD today , plan gentle HD as BP will tolerate.  Doubt vol excess, suspect CXR poor film.  Under dry wt. No UF w HD today.  Vinson Moselle MD [employer] pager [phone]    cell [phone] 02/17/2017, 2:33 PM

## 2017-02-17 NOTE — Progress Notes (Signed)
Dialysis treatment completed.  0 mL ultrafiltrated.  -1100 mL net fluid removal.  Patient status unchanged. Lung sounds diminished to ausculation in all fields. No edema. Cardiac: NSR.  Cleansed RIJ catheter with chlorhexidine.  Disconnected lines and flushed ports with saline per protocol.  Ports locked with heparin and capped per protocol.    Report given to bedside, RN JohnyCutty.

## 2017-02-17 NOTE — Progress Notes (Signed)
Patient arrived to unit by bed.  Reviewed treatment plan and this RN agrees with plan.  Report received from bedside RN, Fuller Canada.  Consent verified.  Patient A & O X 4.   Lung sounds diminished to ausculation in all fields. No edema. Cardiac:  NSR with ectopy.  Removed caps and cleansed RIJ catheter with chlorhedxidine.  Aspirated ports of heparin and flushed them with saline per protocol.  Connected and secured lines, initiated treatment at 1446.  UF Goal of 500 mKL and net fluid removal 0 L.  Will continue to monitor.

## 2017-02-17 NOTE — Progress Notes (Signed)
Hypoglycemic Event  CBG: 54  Treatment: D50 IV 50 mL  Symptoms: None  Follow-up CBG: Time:03:51 CBG Result:168  Possible Reasons for Event: Inadequate meal intake  Comments/MD notified:Hypoglycemia protocol    Vernelle Emerald Christian

## 2017-02-17 NOTE — H&P (Signed)
PULMONARY / CRITICAL CARE MEDICINE   Name: Brandy Dudley MRN: [id number] DOB: 28-Feb-1951    ADMISSION DATE:  02/16/2017   REFERRING MD:   CHIEF COMPLAINT:  Left upper quadrant Pain  HISTORY OF PRESENT ILLNESS:   Brandy Dudley is a 66 year old female with a history of ESRD on HD TTS, CAD s/p PCI, CHF with EF of 30% s/p AICD, DM, DVT on AC, and recent admission for acute cholecystitis which was managed with T tube placed by IR.  The patient was doing well, and maintaining hemodialysis sessions when she was noted to be hypotensive. She complained to left upper quandrant abdominal pain which is now resolved with bowel movement. Upon arrival, patient was hypotensive. 500 cc bolus of saline was administered, with caution due to her history of heart failure.   She carries a diagnosis of COPD and takes her inhaler regularly. She is having trouble remembering their names. She occasionally wears oxygen at home mostly when ambulating.   T tube with 300-400 cc in bag at present   INTERVAL EVENTS:  Patient started on norepinephrine via central line on arrival to the ICU States that she has some stable chronic right upper quadrant pain but that the acute discomfort has improved significantly    VITAL SIGNS: BP (!) 96/53   Pulse 72   Temp 97 F (36.1 C) (Oral)   Resp 13   Ht 5\' 4"  (1.626 m)   Wt 61.8 kg (136 lb 3.9 oz)   SpO2 100%   BMI 23.39 kg/m   HEMODYNAMICS:  VENTILATOR SETTINGS:    INTAKE / OUTPUT: I/O last 3 completed shifts: In: 142 [I.V.:92; IV Piggyback:50] Out: -   PHYSICAL EXAMINATION: Physical Exam: Temp:  [97 F (36.1 C)-99.2 F (37.3 C)] 97 F (36.1 C) (03/24 0730) Pulse Rate:  [64-82] 72 (03/24 0700) Resp:  [12-24] 13 (03/24 0700) BP: (61-150)/(37-70) 96/53 (03/24 0700) SpO2:  [92 %-100 %] 100 % (03/24 0700) Weight:  [61.8 kg (136 lb 3.9 oz)-74.4 kg (164 lb)] 61.8 kg (136 lb 3.9 oz) (03/24 0230)  General No distress, comfortable   HEENT Or fax clear   Pulmonary  Clear bilaterally, no wheezes, no crackles.   Cardiovascular Regular with a decrescendo/crescendo systolic murmur   Abdomen Soft, nontender, right upper quadrant drain in place, no rebound, no guarding   Lymphatics No adenopathy  Neurologic Awake, alert, appropriate, nonfocal   Skin/Integuement No rash      LABS:  BMET  Recent Labs Lab 02/12/17 1913 02/16/17 1644 02/17/17 0356  NA 139 135 133*  K 3.6 4.1 3.7  CL 102 91* 91*  CO2 25 24 24   BUN 34* 21* 25*  CREATININE 4.87* 3.91* 4.31*  GLUCOSE 86 76 157*    Electrolytes  Recent Labs Lab 02/12/17 1913 02/16/17 1644 02/17/17 0356  CALCIUM 8.3* 8.0* 8.1*  MG  --   --  2.0  PHOS  --   --  6.4*    CBC  Recent Labs Lab 02/12/17 1913 02/16/17 1644 02/17/17 0356  WBC 9.8 9.4 11.0*  HGB 9.2* 8.6* 9.2*  HCT 31.6* 29.7* 31.1*  PLT 220 264 291    Coag's No results for input(s): APTT, INR in the last 168 hours.  Sepsis Markers  Recent Labs Lab 02/16/17 1657 02/16/17 2028 02/17/17 0356  LATICACIDVEN 1.08 1.06  --   PROCALCITON  --   --  3.26    ABG No results for input(s): PHART, PCO2ART, PO2ART in the last 168 hours.  Liver Enzymes  Recent Labs Lab 02/12/17 1913 02/16/17 1644  AST 12* 16  ALT 7* 7*  ALKPHOS 121 141*  BILITOT 0.3 1.2  ALBUMIN 1.8* 1.6*    Cardiac Enzymes No results for input(s): TROPONINI, PROBNP in the last 168 hours.  Glucose  Recent Labs Lab 02/13/17 0731 02/13/17 1205 02/13/17 1700 02/17/17 0217 02/17/17 0351 02/17/17 0732  GLUCAP 84 79 73 54* 168* 108*    Imaging Dg Chest 2 View  Result Date: 02/16/2017 CLINICAL DATA:  Confusion and hypotension EXAM: CHEST  2 VIEW COMPARISON:  09/16/2016 FINDINGS: Dialysis catheter is noted in the right jugular vein in satisfactory position. This is new from the prior exam. A defibrillator is again seen and stable. Cardiomegaly is again noted. Mild vascular congestion is seen likely of a chronic nature. No focal infiltrate or  sizable effusion is seen. IMPRESSION: Mild vascular congestion which may be related to volume overload. No other focal abnormality is seen. Electronically Signed   By: Alcide Clever M.D.   On: 02/16/2017 16:13   Ct Abdomen Pelvis W Contrast  Result Date: 02/16/2017 CLINICAL DATA:  66 year old female with multiple medical comorbidities status post transhepatic percutaneous cholecystostomy tube on 02/02/2017. Abdominal pain and hypotension. Initial encounter. EXAM: CT ABDOMEN AND PELVIS WITH CONTRAST TECHNIQUE: Multidetector CT imaging of the abdomen and pelvis was performed using the standard protocol following bolus administration of intravenous contrast. CONTRAST:  ISOVUE-300 IOPAMIDOL (ISOVUE-300) INJECTION 61%, however, it appears the vast majority of a contrast dose extravasated into the left upper extremity as seen on coronal image 80. COMPARISON:  CT Abdomen and Pelvis 02/01/2017, and earlier. FINDINGS: Essentially this is a noncontrast exam may in light of the IV extravasation into the left upper extremity. Lower chest: Left lower chest wall cardiac AICD type device with streak artifact. Severe cardiomegaly. No pericardial effusion. Continued medial left lower lobe atelectasis or consolidation with superimposed calcified granuloma. Interval improved ventilation at the right lung base with residual peribronchial atelectasis. Virtually resolved bilateral small pleural effusions. Hepatobiliary: Transhepatic percutaneous cholecystostomy tube in place with decompressed gallbladder. No biliary ductal enlargement. No discrete liver lesion. Pancreas: Negative. Spleen: Negative. Adrenals/Urinary Tract: Mild bilateral adrenal gland thickening compatible with hyperplasia stable. The kidneys appears stable without hydronephrosis or perinephric stranding. Diminutive, unremarkable urinary bladder. No abdominal free fluid. Stomach/Bowel: Decreased stool ball in the rectum but increased circumferential rectal wall  thickening, up to 19 mm (series 21, image 69). Similar retained stool in the sigmoid colon and left colon. Similar retained stool in the transverse colon and right colon. Negative appendix (series 21, image 61). No dilated small bowel. Negative stomach and duodenum. Vascular/Lymphatic: Extensive Aortoiliac calcified atherosclerosis. Partially visible left femoral region dialysis graft. Poor vascular contrast bolus. Vascular patency not evaluated. Reproductive: Negative. Other: No pelvic free fluid. Rectus muscle diastases with small developing fat and small bowel containing ventral abdominal hernia, non incarcerated. See sagittal image 79. Musculoskeletal: Chronic severe rose to changes throughout the visible lower thoracic spine to the T12 level, not new but progressed since September 2017. However, these bony changes are new since June of 2017. The superior L1 vertebral body now is being eroded, was intact in September. The erosive changes are accompanied by partially visible pedicle fractures at T7. The visible lower ribs remain intact. Severe lower lumbar facet arthropathy. Pelvis and proximal femurs intact. IMPRESSION: 1. Extravasation of the contrast for this study into the patient left upper extremity near the antecubital fossa. Recommend physical exam evaluation of the IV  site, elevation of the extremity, and ice application. Recommend surveillance for progressive skin changes, which if detected should prompt plastic surgery consultation. 2. Progressively abnormal appearance of the visualized thoracic spine with unusual vertebral body erosion which has developed since June of last year. New erosion of the superior T1 vertebral body since September. A pathologic fracture of T7 is suspected but incompletely visualized. 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 3. Right transhepatic percutaneous cholecystostomy tube with no  adverse features. 4. Decreased rectal fecal impaction but circumferential rectal wall thickening suggestive of proctitis. However, no other bowel inflammation. No bowel obstruction. 5. Resolved small bilateral pleural effusions since earlier this month. Residual lower lobe atelectasis. 6. Severe cardiomegaly. Extensive calcified aortoiliac atherosclerosis. 7. Salient findings on this exam were discussed by telephone with Dr. Drema Pry on 02/16/2017 at 1930 hours. Electronically Signed   By: Odessa Fleming M.D.   On: 02/16/2017 19:34   Dg Chest Portable 1 View  Result Date: 02/16/2017 CLINICAL DATA:  Central line placement.  Initial encounter. EXAM: PORTABLE CHEST 1 VIEW COMPARISON:  Chest radiograph performed earlier today at 4:02 p.m. FINDINGS: The lungs are well-aerated. Mild vascular congestion is noted. Increased interstitial markings may reflect mild interstitial edema. There is no evidence of pleural effusion or pneumothorax. The cardiomediastinal silhouette is enlarged. A right-sided dual-lumen catheter is noted ending at the right atrium. An AICD is noted overlying the left chest wall, with a lead ending overlying the right ventricle. No acute osseous abnormalities are seen. A left IJ line is noted ending about the distal SVC. IMPRESSION: 1. Left IJ line noted ending about the distal SVC. 2. Mild vascular congestion and cardiomegaly noted. Increased interstitial markings raise concern for mild interstitial edema. Electronically Signed   By: Roanna Raider M.D.   On: 02/16/2017 23:52     CULTURES: pending  ANTIBIOTICS: Vancomycin 3/24 >>  Zosyn 3/24 >>   SIGNIFICANT EVENTS: Hypotension   LINES/TUBES: CVC left IJ 3/24 >>   DISCUSSION: 66 year old female with history of ESRD, systolic and diastolic CHF, cholecystitis presents for hypotension. I suspect Brandy Dudley has had additional fluid losses per her T tube, which she has not been keeping up with at home. 500 cc has been replaced, which is  about her current T tube loss.  ASSESSMENT / PLAN:  PULMONARY A: history of COPD, intermittently compliant with home oxygen. History of DVT P:   Continue current bronchodilators Wean oxygen as able for saturations greater than 88% Lovenox for DVT  CARDIOVASCULAR A: CAD, CHF with AICD. EF last evaluated in April of 2017 P:  Continue Lovenox for DVT Echocardiogram pending Replace volume gently 3/24 Wean epinephrine as able for goal systolic greater than 90  RENAL A:  ESRD P:   Appreciate nephrology assistance Hopefully we'll be able to wean off pressors so that she can undergo intermittent HD on 3/25 or 3/26  GASTROINTESTINAL A:  Recent GB drain for cystitis Nutrition Continue drainage Empiric anabiotic says below Start renal diet today  HEMATOLOGIC A:  History of DVT, on home AC P:  Oral anticoagulation on hold Continue Lovenox for now  INFECTIOUS A:  Possible sepsis P:   Follow culture data Follow pro-calcitonin Continuing empiric Antibiotics for now  ENDOCRINE A:  Not active  NEUROLOGIC A:  Not active  FAMILY  - Updates: Daughter present at bedside. Patient is FULL CODE and decisional.   - Inter-disciplinary family meet or  Palliative Care meeting due by:  day 7  Independent critical care time 33 minutes  Levy Pupa, MD, PhD 02/17/2017, 8:48 AM [facility] [phone] or if no answer [phone]

## 2017-02-17 NOTE — Consult Note (Signed)
PULMONARY / CRITICAL CARE MEDICINE   Name: Brandy Dudley MRN: [id number] DOB: Aug 01, 1951    ADMISSION DATE:  02/16/2017 CONSULTATION DATE:  @TODAY @   REFERRING MD:   CHIEF COMPLAINT:  Left upper quadrant Pain  HISTORY OF PRESENT ILLNESS:   Brandy Dudley is a 66 year old female with a history of ESRD on HD TTS, CAD s/p PCI, CHF with EF of 30% s/p AICD, DM, DVT on AC, and recent admission for acute cholecystitis which was managed with T tube placed by IR.  The patient was doing well, and maintaining hemodialysis sessions when she was noted to be hypotensive. She complained to left upper quandrant abdominal pain which is now resolved with bowel movement. Upon arrival, patient was hypotensive. 500 cc bolus of saline was administered, with caution due to her history of heart failure.   CVC placed, levo ordered but not started. CCM consulted for ICU admission.   Upon my arrival, patient is resting comfortably. Her blood pressure is being recorded via adult cuff on right lower extremity. She has non functional fistula on left arm and minimally functioning fistula on right arm, Perm cath in right subclavian. Blood pressure is low with MAP 60, SBP 84 and DBP of 47.   Again, the patient states that her pain is gone. She currently has no complaints. Her daughter is present at bedside as well.   She carries a diagnosis of COPD and takes her inhaler regularly. She is having trouble remembering their names. She occasionally wears oxygen at home mostly when ambulating.   T tube with 300-400 cc in bag at present  General: no weight change, no fever, no chills Cardiovascular: no chest pain, palpitations, orthopnea, or PND Respiratory: no cough, sputum production, shortness of breath Gastrointestinal: no nausea, vomiting, diarrhea, or constipation Genitourinary: no pain with urination, no foul odor, no frequency, no urgency Musculoskeletal: no joint pain or swelling, no muscle aches Skin: no rashes, sores,  or ulcers Endocrine: no blood sugar problems Neurologic: no numbness, tingling, dizziness, or visual changes Hematologic: no bleeding, bruising, or clotting problems Psychiatric: no depression or anxiety, no suicidal ideation or intent  PAST MEDICAL HISTORY :  She  has a past medical history of AICD (automatic cardioverter/defibrillator) present; Anemia; Anxiety; C. difficile colitis; Cardiomyopathy (HCC); Charcot's joint of left foot; CHF (congestive heart failure) (HCC); Cholelithiases (01/2017); Complication of anesthesia (02/02/2017); COPD (chronic obstructive pulmonary disease) (HCC); Coronary artery disease; Diabetes mellitus (HCC); DVT (deep venous thrombosis) (HCC); ESRD (end stage renal disease) (HCC); Hypertension; Neuropathy (HCC); Obstructive sleep apnea; Renal disorder; and Wears glasses.  PAST SURGICAL HISTORY: She  has a past surgical history that includes Insertion of dialysis catheter (Left, 04/03/2016); AV fistula placement (Left, 04/03/2016); Ligation of competing branches of arteriovenous fistula (Left, 06/16/2016); Fistula superficialization (Left, 06/16/2016); Revision of arteriovenous goretex graft (Left, 06/16/2016); Cardiac catheterization (N/A, 07/20/2016); Cardiac catheterization (N/A, 07/20/2016); Coronary angioplasty; AV fistula placement (Right, 08/21/2016); ir generic historical (02/02/2017); OTHER SURGICAL HISTORY (02/02/2017); and AV fistula placement (Left, 02/12/2017).  Allergies  Allergen Reactions  . Entresto [Sacubitril-Valsartan] Other (See Comments)     unknown reaction  . Latex Itching  . Sulfa Antibiotics Itching    No current facility-administered medications on file prior to encounter.    Current Outpatient Prescriptions on File Prior to Encounter  Medication Sig  . albuterol (PROAIR HFA) 108 (90 Base) MCG/ACT inhaler Inhale 2 puffs into the lungs every 6 (six) hours as needed for wheezing or shortness of breath.   Marland Kitchen amiodarone (PACERONE)  200 MG tablet Take 1  tablet (200 mg total) by mouth daily.  Marland Kitchen atorvastatin (LIPITOR) 10 MG tablet Take 10 mg by mouth at bedtime.   . famotidine (PEPCID) 20 MG tablet Take 1 tablet (20 mg total) by mouth at bedtime.  . fluticasone (FLOVENT HFA) 110 MCG/ACT inhaler Inhale 2 puffs into the lungs 2 (two) times daily.   . hydrOXYzine (ATARAX/VISTARIL) 50 MG tablet Take 50 mg by mouth every 6 (six) hours as needed for itching.  . insulin glargine (LANTUS) 100 UNIT/ML injection Inject 8 Units into the skin every morning.   . insulin lispro (HUMALOG) 100 UNIT/ML injection Inject 0-10 Units into the skin 3 (three) times daily as needed for high blood sugar (CBG >180). CBG 0-59 Hypoglycemic Protocol, 60-149 0 units 150-250 5 units, 251-300 8 units, 301-350 10 units, >350 call MD  . Inulin (FIBER CHOICE PO) Take 2 tablets by mouth daily as needed (Constipation).   . lactose free nutrition (BOOST PLUS) LIQD Take 237 mLs by mouth daily. At 9PM  . loperamide (IMODIUM) 2 MG capsule Take 1 capsule (2 mg total) by mouth 4 (four) times daily as needed for diarrhea or loose stools.  . metoprolol succinate (TOPROL-XL) 25 MG 24 hr tablet Take 12.5 mg by mouth 2 (two) times daily. HOLD for SBP <110 and HP <60  . mirtazapine (REMERON) 15 MG tablet Take 15 mg by mouth at bedtime.  . Multiple Vitamins-Minerals (CERTAVITE/ANTIOXIDANTS) TABS Take 1 tablet by mouth at bedtime.  . nitroGLYCERIN (NITROSTAT) 0.4 MG SL tablet Place 0.4 mg under the tongue every 5 (five) minutes as needed for chest pain.  Marland Kitchen oxyCODONE-acetaminophen (PERCOCET/ROXICET) 5-325 MG tablet Take 1-2 tablets by mouth every 6 (six) hours as needed for moderate pain or severe pain.  Marland Kitchen Propylene Glycol (SYSTANE BALANCE OP) Place 1 drop into both eyes 2 (two) times daily.  . sertraline (ZOLOFT) 25 MG tablet Take 25 mg by mouth daily.   Marland Kitchen acetaminophen (TYLENOL) 325 MG tablet Take 325 mg by mouth every 6 (six) hours as needed for mild pain or fever (For fever >99.5). Take 650 mg by  mouth q6h prn for mild pain  . OXYGEN Inhale 2 L into the lungs as needed.    FAMILY HISTORY:  Her indicated that all of her three sisters are alive.    SOCIAL HISTORY: She  reports that she quit smoking about 10 years ago. Her smoking use included Cigarettes. She has never used smokeless tobacco. She reports that she does not drink alcohol or use drugs.  REVIEW OF SYSTEMS:   As above  VITAL SIGNS: BP (!) 83/48   Pulse 65   Temp 99.2 F (37.3 C) (Rectal)   Resp (!) 21   Ht 5\' 8"  (1.727 m)   Wt 164 lb (74.4 kg)   SpO2 100%   BMI 24.94 kg/m   HEMODYNAMICS:  as above, unassisted by pressors at this time  VENTILATOR SETTINGS:    INTAKE / OUTPUT: No intake/output data recorded.  PHYSICAL EXAMINATION: Physical Exam: Temp:  [97.7 F (36.5 C)-99.2 F (37.3 C)] 99.2 F (37.3 C) (03/23 1643) Pulse Rate:  [64-81] 65 (03/24 0030) Resp:  [13-22] 21 (03/24 0030) BP: (61-89)/(41-55) 83/48 (03/24 0030) SpO2:  [92 %-100 %] 100 % (03/24 0030) Weight:  [164 lb (74.4 kg)] 164 lb (74.4 kg) (03/23 1459)  General Well nourished, well developed, no apparent distress  HEENT No gross abnormalities. Oropharynx clear.   Pulmonary Clear to auscultation bilaterally with no  wheezes, rales or ronchi. Good effort, symmetrical expansion.   Cardiovascular Normal rate, regular rhythm. S1, s2. No m/r/g. Distal pulses palpable.  Abdomen Soft, non-tender, non-distended, positive bowel sounds, no palpable organomegaly or masses. Normoresonant to percussion.  Lymphatics No cervical adenopathy.   Neurologic Grossly intact. No focal deficits.   Skin/Integuement No rash, no cyanosis, no clubbing.      LABS:  BMET  Recent Labs Lab 02/12/17 1023 02/12/17 1913 02/16/17 1644  NA 139 139 135  K 3.7 3.6 4.1  CL  --  102 91*  CO2  --  25 24  BUN  --  34* 21*  CREATININE  --  4.87* 3.91*  GLUCOSE 105* 86 76    Electrolytes  Recent Labs Lab 02/12/17 1913 02/16/17 1644  CALCIUM 8.3* 8.0*     CBC  Recent Labs Lab 02/12/17 1023 02/12/17 1913 02/16/17 1644  WBC  --  9.8 9.4  HGB 12.6 9.2* 8.6*  HCT 37.0 31.6* 29.7*  PLT  --  220 264    Coag's No results for input(s): APTT, INR in the last 168 hours.  Sepsis Markers  Recent Labs Lab 02/16/17 1657 02/16/17 2028  LATICACIDVEN 1.08 1.06    ABG No results for input(s): PHART, PCO2ART, PO2ART in the last 168 hours.  Liver Enzymes  Recent Labs Lab 02/12/17 1913 02/16/17 1644  AST 12* 16  ALT 7* 7*  ALKPHOS 121 141*  BILITOT 0.3 1.2  ALBUMIN 1.8* 1.6*    Cardiac Enzymes No results for input(s): TROPONINI, PROBNP in the last 168 hours.  Glucose  Recent Labs Lab 02/12/17 2006 02/13/17 0053 02/13/17 0402 02/13/17 0731 02/13/17 1205 02/13/17 1700  GLUCAP 80 94 79 84 79 73    Imaging Dg Chest 2 View  Result Date: 02/16/2017 CLINICAL DATA:  Confusion and hypotension EXAM: CHEST  2 VIEW COMPARISON:  09/16/2016 FINDINGS: Dialysis catheter is noted in the right jugular vein in satisfactory position. This is new from the prior exam. A defibrillator is again seen and stable. Cardiomegaly is again noted. Mild vascular congestion is seen likely of a chronic nature. No focal infiltrate or sizable effusion is seen. IMPRESSION: Mild vascular congestion which may be related to volume overload. No other focal abnormality is seen. Electronically Signed   By: Alcide Clever M.D.   On: 02/16/2017 16:13   Ct Abdomen Pelvis W Contrast  Result Date: 02/16/2017 CLINICAL DATA:  66 year old female with multiple medical comorbidities status post transhepatic percutaneous cholecystostomy tube on 02/02/2017. Abdominal pain and hypotension. Initial encounter. EXAM: CT ABDOMEN AND PELVIS WITH CONTRAST TECHNIQUE: Multidetector CT imaging of the abdomen and pelvis was performed using the standard protocol following bolus administration of intravenous contrast. CONTRAST:  ISOVUE-300 IOPAMIDOL (ISOVUE-300) INJECTION 61%,  however, it appears the vast majority of a contrast dose extravasated into the left upper extremity as seen on coronal image 80. COMPARISON:  CT Abdomen and Pelvis 02/01/2017, and earlier. FINDINGS: Essentially this is a noncontrast exam may in light of the IV extravasation into the left upper extremity. Lower chest: Left lower chest wall cardiac AICD type device with streak artifact. Severe cardiomegaly. No pericardial effusion. Continued medial left lower lobe atelectasis or consolidation with superimposed calcified granuloma. Interval improved ventilation at the right lung base with residual peribronchial atelectasis. Virtually resolved bilateral small pleural effusions. Hepatobiliary: Transhepatic percutaneous cholecystostomy tube in place with decompressed gallbladder. No biliary ductal enlargement. No discrete liver lesion. Pancreas: Negative. Spleen: Negative. Adrenals/Urinary Tract: Mild bilateral adrenal gland thickening compatible  with hyperplasia stable. The kidneys appears stable without hydronephrosis or perinephric stranding. Diminutive, unremarkable urinary bladder. No abdominal free fluid. Stomach/Bowel: Decreased stool ball in the rectum but increased circumferential rectal wall thickening, up to 19 mm (series 21, image 69). Similar retained stool in the sigmoid colon and left colon. Similar retained stool in the transverse colon and right colon. Negative appendix (series 21, image 61). No dilated small bowel. Negative stomach and duodenum. Vascular/Lymphatic: Extensive Aortoiliac calcified atherosclerosis. Partially visible left femoral region dialysis graft. Poor vascular contrast bolus. Vascular patency not evaluated. Reproductive: Negative. Other: No pelvic free fluid. Rectus muscle diastases with small developing fat and small bowel containing ventral abdominal hernia, non incarcerated. See sagittal image 79. Musculoskeletal: Chronic severe rose to changes throughout the visible lower thoracic  spine to the T12 level, not new but progressed since September 2017. However, these bony changes are new since June of 2017. The superior L1 vertebral body now is being eroded, was intact in September. The erosive changes are accompanied by partially visible pedicle fractures at T7. The visible lower ribs remain intact. Severe lower lumbar facet arthropathy. Pelvis and proximal femurs intact. IMPRESSION: 1. Extravasation of the contrast for this study into the patient left upper extremity near the antecubital fossa. Recommend physical exam evaluation of the IV site, elevation of the extremity, and ice application. Recommend surveillance for progressive skin changes, which if detected should prompt plastic surgery consultation. 2. Progressively abnormal appearance of the visualized thoracic spine with unusual vertebral body erosion which has developed since June of last year. New erosion of the superior T1 vertebral body since September. A pathologic fracture of T7 is suspected but incompletely visualized. 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 3. Right transhepatic percutaneous cholecystostomy tube with no adverse features. 4. Decreased rectal fecal impaction but circumferential rectal wall thickening suggestive of proctitis. However, no other bowel inflammation. No bowel obstruction. 5. Resolved small bilateral pleural effusions since earlier this month. Residual lower lobe atelectasis. 6. Severe cardiomegaly. Extensive calcified aortoiliac atherosclerosis. 7. Salient findings on this exam were discussed by telephone with Dr. Drema Pry on 02/16/2017 at 1930 hours. Electronically Signed   By: Odessa Fleming M.D.   On: 02/16/2017 19:34   Dg Chest Portable 1 View  Result Date: 02/16/2017 CLINICAL DATA:  Central line placement.  Initial encounter. EXAM: PORTABLE CHEST 1 VIEW COMPARISON:  Chest radiograph performed earlier today at  4:02 p.m. FINDINGS: The lungs are well-aerated. Mild vascular congestion is noted. Increased interstitial markings may reflect mild interstitial edema. There is no evidence of pleural effusion or pneumothorax. The cardiomediastinal silhouette is enlarged. A right-sided dual-lumen catheter is noted ending at the right atrium. An AICD is noted overlying the left chest wall, with a lead ending overlying the right ventricle. No acute osseous abnormalities are seen. A left IJ line is noted ending about the distal SVC. IMPRESSION: 1. Left IJ line noted ending about the distal SVC. 2. Mild vascular congestion and cardiomegaly noted. Increased interstitial markings raise concern for mild interstitial edema. Electronically Signed   By: Roanna Raider M.D.   On: 02/16/2017 23:52     CULTURES: pending  ANTIBIOTICS: vanc/zosyn  SIGNIFICANT EVENTS: Hypotension   LINES/TUBES: PIV, CVC left IJ,, right perm cath  DISCUSSION: 66 year old female with history of ESRD, systolic and diastolic CHF, cholecystitis presents for hypotension. I suspect Brandy Dudley has had additional fluid losses per her T tube, which she  has not been keeping up with at home. 500 cc has been replaced, which is about her current T tube loss. I do not suspect florid sepsis. Her WBC count is down to 9.6, she remains afebrile   ASSESSMENT / PLAN:  PULMONARY A: history of COPD, intermittently compliant with home oxygen. History of DVT P:   Continue ICS/LABA, prn albuterol Maintain saturations > 88% Full dose lovenox  CARDIOVASCULAR A: CAD, CHF with AICD. EF last evaluated in April of 2017 P:  Continue full dose anticoagulation with Lovenox, over NOAC ECHO Hold levo   RENAL A:  ESRD P:   Nephrology following I will add additional 1L NS bolus now  GASTROINTESTINAL A:  Not active  HEMATOLOGIC A:  History of DVT, on home AC P:  Hold PO AC, and use lovenox BID dosing while in ICU  INFECTIOUS A:  Low suspicion of sepsis at  this time. Cultures pending P:   Follow cultures procal On vanc/zosyn  ENDOCRINE A:  Not active  NEUROLOGIC A:  Not active  FAMILY  - Updates: Daughter present at bedside. Patient is FULL CODE and decisional.   - Inter-disciplinary family meet or Palliative Care meeting due by:  day 7   The patient is critically ill with multiple organ system failure and requires high complexity decision making for assessment and support, frequent evaluation and titration of therapies, advanced monitoring, review of radiographic studies and interpretation of complex data.   Critical Care Time devoted to patient care services, exclusive of separately billable procedures, described in this note is 40 minutes.   Greta Doom DO Pulmonary and Critical Care Medicine [facility] Pager: [phone]  02/17/2017, 12:39 AM

## 2017-02-17 NOTE — Procedures (Signed)
  I was present at this dialysis session, have reviewed the session itself and made  appropriate changes Rob Chelsie Burel MD [facility] pager [phone]   02/17/2017, 4:42 PM    

## 2017-02-18 LAB — GLUCOSE, CAPILLARY
GLUCOSE-CAPILLARY: 113 mg/dL — AB (ref 65–99)
GLUCOSE-CAPILLARY: 91 mg/dL (ref 65–99)
Glucose-Capillary: 80 mg/dL (ref 65–99)
Glucose-Capillary: 101 mg/dL — ABNORMAL HIGH (ref 65–99)

## 2017-02-18 LAB — BASIC METABOLIC PANEL
Anion gap: 10 (ref 5–15)
BUN: 9 mg/dL (ref 6–20)
CO2: 29 mmol/L (ref 22–32)
CREATININE: 2.46 mg/dL — AB (ref 0.44–1.00)
Calcium: 8 mg/dL — ABNORMAL LOW (ref 8.9–10.3)
Chloride: 99 mmol/L — ABNORMAL LOW (ref 101–111)
GFR calc non Af Amer: 19 mL/min — ABNORMAL LOW (ref >60)
GFR, EST AFRICAN AMERICAN: 22 mL/min — AB (ref >60)
Glucose, Bld: 84 mg/dL (ref 65–99)
POTASSIUM: 3.5 mmol/L (ref 3.5–5.1)
Sodium: 138 mmol/L (ref 135–145)

## 2017-02-18 LAB — CBC
HEMATOCRIT: 25.7 % — AB (ref 36.0–46.0)
HEMOGLOBIN: 7.4 g/dL — AB (ref 12.0–15.0)
MCH: 23.6 pg — ABNORMAL LOW (ref 26.0–34.0)
MCHC: 28.8 g/dL — ABNORMAL LOW (ref 30.0–36.0)
MCV: 82.1 fL (ref 78.0–100.0)
Platelets: 204 10*3/uL (ref 150–400)
RBC: 3.13 MIL/uL — ABNORMAL LOW (ref 3.87–5.11)
RDW: 17.2 % — AB (ref 11.5–15.5)
WBC: 8.5 10*3/uL (ref 4.0–10.5)

## 2017-02-18 LAB — MAGNESIUM: Magnesium: 1.9 mg/dL (ref 1.7–2.4)

## 2017-02-18 MED ORDER — VANCOMYCIN HCL IN DEXTROSE 750-5 MG/150ML-% IV SOLN
750.0000 mg | Freq: Once | INTRAVENOUS | Status: AC
  Administered 2017-02-18: 750 mg via INTRAVENOUS
  Filled 2017-02-18: qty 150

## 2017-02-18 MED ORDER — VANCOMYCIN HCL IN DEXTROSE 750-5 MG/150ML-% IV SOLN: 750.0000 mg | INTRAVENOUS | Status: DC

## 2017-02-18 MED ORDER — AMIODARONE HCL 200 MG PO TABS
200.0000 mg | ORAL_TABLET | Freq: Every day | ORAL | Status: DC
  Administered 2017-02-18 – 2017-02-23 (×6): 200 mg via ORAL
  Filled 2017-02-18 (×6): qty 1

## 2017-02-18 MED ORDER — SODIUM CHLORIDE 0.9% FLUSH
10.0000 mL | INTRAVENOUS | Status: DC | PRN
  Administered 2017-02-18: 10 mL
  Filled 2017-02-18: qty 40

## 2017-02-18 NOTE — Progress Notes (Signed)
Pt transferred to 6E29. Pt's cell phone in her hands, and clothing are in the "green bag". Bed in low position, call Tisby in reach, Pt called and updated the family about transfer. Pt's VS stable, denies pain or discomfort.  (Pt stated that glasses were given to the sister by patient)

## 2017-02-18 NOTE — Progress Notes (Addendum)
PULMONARY / CRITICAL CARE MEDICINE   Name: Brandy Dudley MRN: [id number] DOB: 06-24-1951    ADMISSION DATE:  02/16/2017   REFERRING MD:   CHIEF COMPLAINT:  Left upper quadrant Pain  HISTORY OF PRESENT ILLNESS:   Brandy Dudley is a 66 year old female with a history of ESRD on HD TTS, CAD s/p PCI, CHF with EF of 30% s/p AICD, DM, DVT on AC, and recent admission for acute cholecystitis which was managed with T tube placed by IR.  The patient was doing well, and maintaining hemodialysis sessions when she was noted to be hypotensive. She complained to left upper quandrant abdominal pain which is now resolved with bowel movement. Upon arrival, patient was hypotensive. 500 cc bolus of saline was administered, with caution due to her history of heart failure.   She carries a diagnosis of COPD and takes her inhaler regularly. She is having trouble remembering their names. She occasionally wears oxygen at home mostly when ambulating.   T tube with 300-400 cc in bag at present   INTERVAL EVENTS:  Tolerated hemodialysis yesterday Norepinephrine has been weaned off States that she feels significantly better with less right upper quadrant discomfort.  VITAL SIGNS: BP (!) 93/46 (BP Location: Right Arm)   Pulse 62   Temp 97.5 F (36.4 C) (Oral)   Resp 14   Ht 5\' 4"  (1.626 m)   Wt 62.6 kg (138 lb 0.1 oz)   SpO2 99%   BMI 23.69 kg/m   HEMODYNAMICS:  VENTILATOR SETTINGS:    INTAKE / OUTPUT: I/O last 3 completed shifts: In: 506.7 [P.O.:150; I.V.:256.7; IV Piggyback:100] Out: -500 [Drains:600]  PHYSICAL EXAMINATION: Physical Exam: Temp:  [97.3 F (36.3 C)-97.8 F (36.6 C)] 97.5 F (36.4 C) (03/25 0725) Pulse Rate:  [61-75] 62 (03/25 0700) Resp:  [10-20] 14 (03/25 0700) BP: (69-119)/(31-63) 93/46 (03/25 0700) SpO2:  [99 %-100 %] 99 % (03/25 0700) Weight:  [61.8 kg (136 lb 3.9 oz)-62.6 kg (138 lb 0.1 oz)] 62.6 kg (138 lb 0.1 oz) (03/25 0405)  General No distress, comfortable rest Brandy Dudley  pattern   HEENT OP clear, eye exam normal   Pulmonary Clear bilaterally, no crackles, no wheezes   Cardiovascular Regular, no murmur   Abdomen Right upper quadrant drain in place, soft, nontender, no guarding   Lymphatics No adenopathy noted   Neurologic Alert, appropriate, nonfocal, moves all extremities   Skin/Integuement No rashes      LABS:  BMET  Recent Labs Lab 02/16/17 1644 02/17/17 0356 02/18/17 0415  NA 135 133* 138  K 4.1 3.7 3.5  CL 91* 91* 99*  CO2 24 24 29   BUN 21* 25* 9  CREATININE 3.91* 4.31* 2.46*  GLUCOSE 76 157* 84    Electrolytes  Recent Labs Lab 02/16/17 1644 02/17/17 0356 02/18/17 0415  CALCIUM 8.0* 8.1* 8.0*  MG  --  2.0 1.9  PHOS  --  6.4*  --     CBC  Recent Labs Lab 02/16/17 1644 02/17/17 0356 02/18/17 0415  WBC 9.4 11.0* 8.5  HGB 8.6* 9.2* 7.4*  HCT 29.7* 31.1* 25.7*  PLT 264 291 204    Coag's No results for input(s): APTT, INR in the last 168 hours.  Sepsis Markers  Recent Labs Lab 02/16/17 1657 02/16/17 2028 02/17/17 0356  LATICACIDVEN 1.08 1.06  --   PROCALCITON  --   --  3.26    ABG No results for input(s): PHART, PCO2ART, PO2ART in the last 168 hours.  Liver Enzymes  Recent Labs Lab 02/12/17 1913 02/16/17 1644  AST 12* 16  ALT 7* 7*  ALKPHOS 121 141*  BILITOT 0.3 1.2  ALBUMIN 1.8* 1.6*    Cardiac Enzymes No results for input(s): TROPONINI, PROBNP in the last 168 hours.  Glucose  Recent Labs Lab 02/17/17 0351 02/17/17 0732 02/17/17 1114 02/17/17 2030 02/17/17 2324 02/18/17 0727  GLUCAP 168* 108* 109* 105* 104* 80    Imaging No results found.   CULTURES: pending  ANTIBIOTICS: Vancomycin 3/24 >>  Zosyn 3/24 >>   SIGNIFICANT EVENTS: Hypotension   LINES/TUBES: CVC left IJ 3/24 >>   DISCUSSION: 66 year old female with history of ESRD, systolic and diastolic CHF, cholecystitis presents for hypotension. I suspect Brandy Dudley has had additional fluid losses per her T tube, Some  degree of hypovolemia  ASSESSMENT / PLAN:  PULMONARY A: history of COPD, intermittently compliant with home oxygen. History of DVT P:   Changed back to Spiriva plus Dulera Oxygen as needed to maintain saturation greater than 88%  CARDIOVASCULAR A: CAD, CHF with AICD. EF last evaluated in April of 2017 Atrial Fib P:  Follow-up echocardiogram result Restart amiodarone today Goal systolic blood pressure greater than 90, norepinephrine weaned to off  RENAL A:  ESRD P:   Appreciate nephrology assistance Tolerated dialysis on 3/24  GASTROINTESTINAL A:  Recent GB drain for cystitis Nutrition Drain in place and operating appropriately, follow output Empiric Antibiotics as below Continue renal diet  HEMATOLOGIC A:  History of DVT, not on home anticoagulation at this time P:  She was not on home anticoagulation, her DVT was remote. Changed to prophylactic heparin subcutaneous on 3/24  INFECTIOUS A:  Possible sepsis P:   Blood cultures negative to date Pct consistent with possible infection, 3.26 Continue empiric antibiotics, plan to narrow soon. Suspect that her hypotension was largely due to hypovolemia but note elevated pro calcitonin  ENDOCRINE A:  Not active  NEUROLOGIC A:  Not active  FAMILY  - Updates: Patient updated 3/25  - Inter-disciplinary family meet or Palliative Care meeting due by:  day 7  Transferred to a floor bed. Transfer to [facility] service as of 3/26   Brandy Pupa, MD, PhD 02/18/2017, 8:01 AM Brandy Dudley Pulmonary and Critical Care [phone] or if no answer [phone]

## 2017-02-18 NOTE — Progress Notes (Signed)
  [facility] Progress Note   Subjective: feeling better.  Off pressors today  Vitals:   02/18/17 0500 02/18/17 0600 02/18/17 0700 02/18/17 0725  BP: (!) 98/46 (!) 101/51 (!) 93/46   Pulse: 64 65 62   Resp: 10 11 14    Temp:    97.5 F (36.4 C)  TempSrc:    Oral  SpO2: 100% 100% 99%   Weight:      Height:        Inpatient medications: . darbepoetin (ARANESP) injection - DIALYSIS  200 mcg Intravenous Q Sat-HD  . feeding supplement (NEPRO CARB STEADY)  237 mL Oral BID BM  . feeding supplement (PRO-STAT SUGAR FREE 64)  30 mL Oral BID  . heparin subcutaneous  5,000 Units Subcutaneous Q8H  . mometasone-formoterol  2 puff Inhalation BID  . multivitamin  1 tablet Oral QHS  . piperacillin-tazobactam (ZOSYN)  IV  3.375 g Intravenous Q12H  . tiotropium  18 mcg Inhalation Daily   . norepinephrine (LEVOPHED) Adult infusion Stopped (02/17/17 1404)   sodium chloride, sodium chloride, sodium chloride, alteplase, heparin, heparin, lidocaine (PF), lidocaine-prilocaine, pentafluoroprop-tetrafluoroeth  Exam: Pleasant older adult female, no distress, calm No jvd Chest clear bilat RRR no RG ABd - R sided drainage bag, nontender abd Ext no edema or wounds NF, ox3 L thigh AVG+bruit, no signs of infection R IJ cath   [facility]   400/800  67kg   2/2.25  P2  R IJ cath/ maturing L thigh AVG placed 3/19  Hep 6000 - mircera 225 last 3/8, Fe 50/wk, no vdra - labs: Hb9.8  tsat 19%  , had course of Fe recently, ipth 158      Assessment: 1. Abd pain and hypotension / recent perc chole tube for acute cholecysititis - on empiric abx for suspected sepsis.  2. Volume - is down 4-5kg under dry wt, no vol excess 3. 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 Anemia of CKD- redosed ESA 200 darbe on 3/24 6. Hx COPD 7. PAF - on amio/ MTP 25 XL at home 8. DM - off insulin now    Plan - will follow , no new suggestions   Vinson Moselle MD [facility] pager  [phone]   02/18/2017, 8:34 AM    Recent Labs Lab 02/16/17 1644 02/17/17 0356 02/18/17 0415  NA 135 133* 138  K 4.1 3.7 3.5  CL 91* 91* 99*  CO2 24 24 29   GLUCOSE 76 157* 84  BUN 21* 25* 9  CREATININE 3.91* 4.31* 2.46*  CALCIUM 8.0* 8.1* 8.0*  PHOS  --  6.4*  --     Recent Labs Lab 02/12/17 1913 02/16/17 1644  AST 12* 16  ALT 7* 7*  ALKPHOS 121 141*  BILITOT 0.3 1.2  PROT 7.9 8.3*  ALBUMIN 1.8* 1.6*    Recent Labs Lab 02/16/17 1644 02/17/17 0356 02/18/17 0415  WBC 9.4 11.0* 8.5  NEUTROABS 7.1 9.0*  --   HGB 8.6* 9.2* 7.4*  HCT 29.7* 31.1* 25.7*  MCV 82.3 81.6 82.1  PLT 264 291 204   Iron/TIBC/Ferritin/ %Sat    Component Value Date/Time   IRON 25 (L) 02/17/2017 1506   TIBC 143 (L) 02/17/2017 1506   FERRITIN 181 04/04/2016 0748   IRONPCTSAT 18 02/17/2017 1506

## 2017-02-18 NOTE — Progress Notes (Signed)
Patient transferred from 22M. Patient AO4, orders reviewed. VSS. Will monitor.

## 2017-02-18 NOTE — H&P (Signed)
Report was given to 6N29. Pt VS stable.

## 2017-02-18 NOTE — Progress Notes (Signed)
Pharmacy Antibiotic Note  Arri Kochan is a 66 y.o. female admitted on 02/16/2017 with intra-abdominal infection.  Patient has ESRD on HD TTS Last HD yesterday  Plan: Zosyn 3.375gm IV every 12 hours Vancomycin 750 mg x 1 and with each HD F/U HD schedule, cx, LOT  Height: 5\' 4"  (162.6 cm) Weight: 138 lb 0.1 oz (62.6 kg) IBW/kg (Calculated) : 54.7  Temp (24hrs), Avg:97.5 F (36.4 C), Min:97.3 F (36.3 C), Max:97.8 F (36.6 C)   Recent Labs Lab 02/12/17 1913 02/16/17 1644 02/16/17 1657 02/16/17 2028 02/17/17 0356 02/18/17 0415  WBC 9.8 9.4  --   --  11.0* 8.5  CREATININE 4.87* 3.91*  --   --  4.31* 2.46*  LATICACIDVEN  --   --  1.08 1.06  --   --     Estimated Creatinine Clearance: 19.4 mL/min (A) (by C-G formula based on SCr of 2.46 mg/dL (H)).    Allergies  Allergen Reactions  . Entresto [Sacubitril-Valsartan] Other (See Comments)     unknown reaction  . Latex Itching  . Sulfa Antibiotics Itching   Isaac Bliss, PharmD, BCPS, BCCCP Clinical Pharmacist Clinical phone for 02/18/2017 from 7a-3:30p: [phone] If after 3:30p, please call main pharmacy at: [phone] 02/18/2017 9:28 AM

## 2017-02-19 ENCOUNTER — Inpatient Hospital Stay (HOSPITAL_COMMUNITY): Payer: Medicare PPO

## 2017-02-19 DIAGNOSIS — R0789 Other chest pain: Secondary | ICD-10-CM

## 2017-02-19 DIAGNOSIS — I959 Hypotension, unspecified: Secondary | ICD-10-CM

## 2017-02-19 DIAGNOSIS — N185 Chronic kidney disease, stage 5: Secondary | ICD-10-CM

## 2017-02-19 DIAGNOSIS — I255 Ischemic cardiomyopathy: Secondary | ICD-10-CM

## 2017-02-19 DIAGNOSIS — D631 Anemia in chronic kidney disease: Secondary | ICD-10-CM

## 2017-02-19 DIAGNOSIS — K810 Acute cholecystitis: Secondary | ICD-10-CM

## 2017-02-19 LAB — BASIC METABOLIC PANEL
Anion gap: 12 (ref 5–15)
BUN: 15 mg/dL (ref 6–20)
CALCIUM: 8.1 mg/dL — AB (ref 8.9–10.3)
CO2: 27 mmol/L (ref 22–32)
CREATININE: 3.62 mg/dL — AB (ref 0.44–1.00)
Chloride: 98 mmol/L — ABNORMAL LOW (ref 101–111)
GFR calc Af Amer: 14 mL/min — ABNORMAL LOW (ref >60)
GFR, EST NON AFRICAN AMERICAN: 12 mL/min — AB (ref >60)
Glucose, Bld: 84 mg/dL (ref 65–99)
Potassium: 3.3 mmol/L — ABNORMAL LOW (ref 3.5–5.1)
SODIUM: 137 mmol/L (ref 135–145)

## 2017-02-19 LAB — PHOSPHORUS: Phosphorus: 3 mg/dL (ref 2.5–4.6)

## 2017-02-19 LAB — GLUCOSE, CAPILLARY
GLUCOSE-CAPILLARY: 99 mg/dL (ref 65–99)
GLUCOSE-CAPILLARY: 103 mg/dL — AB (ref 65–99)
Glucose-Capillary: 84 mg/dL (ref 65–99)
Glucose-Capillary: 92 mg/dL (ref 65–99)

## 2017-02-19 LAB — HEPATITIS B SURFACE ANTIGEN: HEP B S AG: NEGATIVE

## 2017-02-19 LAB — AMMONIA: Ammonia: 31 umol/L (ref 9–35)

## 2017-02-19 LAB — MAGNESIUM: MAGNESIUM: 1.8 mg/dL (ref 1.7–2.4)

## 2017-02-19 MED ORDER — LEVOFLOXACIN IN D5W 750 MG/150ML IV SOLN
750.0000 mg | Freq: Once | INTRAVENOUS | Status: AC
  Administered 2017-02-19: 750 mg via INTRAVENOUS
  Filled 2017-02-19: qty 150

## 2017-02-19 MED ORDER — FENTANYL CITRATE (PF) 100 MCG/2ML IJ SOLN
12.5000 ug | Freq: Once | INTRAMUSCULAR | Status: AC
  Administered 2017-02-19: 12.5 ug via INTRAVENOUS
  Filled 2017-02-19: qty 2

## 2017-02-19 MED ORDER — LIDOCAINE 5 % EX PTCH: 1.0000 | MEDICATED_PATCH | CUTANEOUS | 0 refills | Status: DC

## 2017-02-19 MED ORDER — LORAZEPAM 2 MG/ML IJ SOLN
0.5000 mg | INTRAMUSCULAR | Status: DC | PRN
  Administered 2017-02-19: 0.5 mg via INTRAVENOUS
  Filled 2017-02-19: qty 1

## 2017-02-19 MED ORDER — LIDOCAINE 5 % EX PTCH
1.0000 | MEDICATED_PATCH | CUTANEOUS | Status: DC
Start: 2017-02-19 — End: 2017-02-19
  Administered 2017-02-19: 1 via TRANSDERMAL
  Filled 2017-02-19: qty 1

## 2017-02-19 MED ORDER — CHLORPROMAZINE HCL 25 MG PO TABS
25.0000 mg | ORAL_TABLET | Freq: Three times a day (TID) | ORAL | Status: DC | PRN
  Filled 2017-02-19: qty 1

## 2017-02-19 MED ORDER — FENTANYL CITRATE (PF) 100 MCG/2ML IJ SOLN: 12.5000 ug | Freq: Once | INTRAMUSCULAR | Status: DC

## 2017-02-19 MED ORDER — LEVOFLOXACIN IN D5W 500 MG/100ML IV SOLN
500.0000 mg | INTRAVENOUS | Status: AC
  Administered 2017-02-21: 500 mg via INTRAVENOUS
  Filled 2017-02-19: qty 100

## 2017-02-19 MED ORDER — PROMETHAZINE HCL 25 MG/ML IJ SOLN: 12.5000 mg | Freq: Once | INTRAMUSCULAR | Status: DC

## 2017-02-19 MED ORDER — SODIUM CHLORIDE 0.9 % IV SOLN
125.0000 mg | INTRAVENOUS | Status: DC
  Administered 2017-02-20 – 2017-02-22 (×2): 125 mg via INTRAVENOUS
  Filled 2017-02-19 (×4): qty 10

## 2017-02-19 MED ORDER — PROMETHAZINE HCL 25 MG PO TABS: 12.5000 mg | ORAL_TABLET | Freq: Once | ORAL | Status: DC

## 2017-02-19 MED ORDER — LEVOFLOXACIN 500 MG PO TABS: 500.0000 mg | ORAL_TABLET | Freq: Once | ORAL | 0 refills | Status: DC

## 2017-02-19 MED ORDER — OXYCODONE-ACETAMINOPHEN 5-325 MG PO TABS: 1.0000 | ORAL_TABLET | Freq: Four times a day (QID) | ORAL | 0 refills | Status: DC | PRN

## 2017-02-19 NOTE — NC FL2 (Signed)
[place] MEDICAID FL2 LEVEL OF CARE SCREENING TOOL     IDENTIFICATION  Patient Name: Brandy Dudley Birthdate: 1951-04-21 Sex: female Admission Date (Current Location): 02/16/2017  [place] and [place] Number:  [facility] Bast [id number] S Facility and Address:  [facility], [address] [place], [place] [address]      Provider Number: [id number]  Attending Physician Name and Address:  Eddie North, MD  Relative Name and Phone Number:  Willette Alma [phone];  Califano,Laurene-Sister, [phone];  Pinson,Teresa-Sister, [phone]     Current Level of Care: Hospital Recommended Level of Care: Skilled Nursing Facility Prior Approval Number:    Date Approved/Denied:   PASRR Number: [id number] (Eff. 02/09/16)  Discharge Plan: SNF    Current Diagnoses: Patient Active Problem List   Diagnosis Date Noted  . Hypotension 02/19/2017  . Shock (HCC) 02/17/2017  . Acute cholecystitis 02/02/2017  . Preoperative clearance   . Protein-calorie malnutrition, severe 07/18/2016  . Positive blood culture 07/17/2016  . Atherosclerotic peripheral vascular disease (HCC) 07/17/2016  . Bacteremia 07/17/2016  . Depression 07/05/2016  . Umbilical hernia without obstruction or gangrene 07/05/2016  . Generalized anxiety disorder 07/05/2016  . Loss of weight 07/05/2016  . Chronic combined systolic and diastolic CHF (congestive heart failure) (HCC) 05/07/2016  . 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 Anemia of chronic renal failure, stage 5 (HCC) 04/14/2016  . ESRD on dialysis (HCC)   . History of Clostridium difficile colitis 03/20/2016  . COPD (chronic obstructive pulmonary disease) (HCC) 03/20/2016  . Obstructive sleep apnea 03/20/2016  . AICD (automatic cardioverter/defibrillator) present 03/20/2016  . Physical deconditioning 03/02/2016  . DVT (deep venous thrombosis), right 03/02/2016  . Cardiomyopathy, ischemic 03/02/2016  .  Coronary artery disease involving native coronary artery of native heart without angina pectoris 03/02/2016  . Diabetes mellitus type 2 in obese (HCC) 03/02/2016    Orientation RESPIRATION BLADDER Height & Weight     Self, Time, Situation  Normal Continent Weight: 147 lb (66.7 kg) Height:  5\' 4"  (162.6 cm)  BEHAVIORAL SYMPTOMS/MOOD NEUROLOGICAL BOWEL NUTRITION STATUS      Incontinent Diet (Renal diet with 1200 mL fluid restriction)  AMBULATORY STATUS COMMUNICATION OF NEEDS Skin   Limited Assist Verbally Normal                       Personal Care Assistance Level of Assistance  Bathing, Feeding, Dressing Bathing Assistance: Limited assistance Feeding assistance: Independent Dressing Assistance: Limited assistance     Functional Limitations Info  Sight, Hearing, Speech Sight Info: Impaired Hearing Info: Adequate Speech Info: Adequate    SPECIAL CARE FACTORS FREQUENCY                       Contractures Contractures Info: Not present    Additional Factors Info  Code Status, Allergies Code Status Info: Full Allergies Info: Entresto, Latex, Sulfa antibiotics           Current Medications (02/19/2017):  This is the current hospital active medication list Current Facility-Administered Medications  Medication Dose Route Frequency Provider Last Rate Last Dose  . 0.9 %  sodium chloride infusion  250 mL Intravenous PRN Elveria Rising, DO   Stopped at 02/17/17 2000  . 0.9 %  sodium chloride infusion  100 mL Intravenous PRN Weston Settle, PA-C      . 0.9 %  sodium chloride infusion  100 mL Intravenous PRN Weston Settle, PA-C      .  alteplase (CATHFLO ACTIVASE) injection 2 mg  2 mg Intracatheter Once PRN Weston Settle, PA-C      . amiodarone (PACERONE) tablet 200 mg  200 mg Oral Daily Leslye Peer, MD   200 mg at 02/19/17 0908  . Darbepoetin Alfa (ARANESP) injection 200 mcg  200 mcg Intravenous Q Sat-HD Weston Settle, PA-C   200 mcg at 02/17/17 1611  .  feeding supplement (NEPRO CARB STEADY) liquid 237 mL  237 mL Oral BID BM Weston Settle, PA-C   237 mL at 02/18/17 1313  . heparin injection 1,000 Units  1,000 Units Dialysis PRN Weston Settle, PA-C      . heparin injection 1,200 Units  20 Units/kg Dialysis PRN Weston Settle, PA-C      . heparin injection 5,000 Units  5,000 Units Subcutaneous Q8H Leslye Peer, MD   5,000 Units at 02/19/17 0600  . [START ON 02/21/2017] levofloxacin (LEVAQUIN) IVPB 500 mg  500 mg Intravenous Q48H Lauren D Bajbus, RPH      . lidocaine (LIDODERM) 5 % 1 patch  1 patch Transdermal Q24H Nishant Dhungel, MD   1 patch at 02/19/17 1027  . lidocaine (PF) (XYLOCAINE) 1 % injection 5 mL  5 mL Intradermal PRN Weston Settle, PA-C      . lidocaine-prilocaine (EMLA) cream 1 application  1 application Topical PRN Weston Settle, PA-C      . mometasone-formoterol [facility]) 200-5 MCG/ACT inhaler 2 puff  2 puff Inhalation BID Elveria Rising, DO   2 puff at 02/19/17 0735  . multivitamin (RENA-VIT) tablet 1 tablet  1 tablet Oral QHS Weston Settle, PA-C   1 tablet at 02/17/17 2200  . pentafluoroprop-tetrafluoroeth (GEBAUERS) aerosol 1 application  1 application Topical PRN Weston Settle, PA-C      . sodium chloride flush (NS) 0.9 % injection 10-40 mL  10-40 mL Intracatheter PRN Leslye Peer, MD   10 mL at 02/18/17 1723  . tiotropium (SPIRIVA) inhalation capsule 18 mcg  18 mcg Inhalation Daily Elveria Rising, DO   18 mcg at 02/19/17 1610     Discharge Medications: Please see discharge summary for a list of discharge medications.  Relevant Imaging Results:  Relevant Lab Results:   Additional Information ss#[id number].  Dialysis patient TTS at [facility] dialysis center  [place], Lazaro Arms, LCSW

## 2017-02-19 NOTE — Progress Notes (Signed)
Patient is having jerking movements all over body after having CVC triple lumen removed from L neck. Vitals are as follows: 74, 94/48, and 100%. Patient states she is cold. Blanket applied. Patient states pain is a 7/10 under L breast. MD notified. Dhungel, MD ordered to collect CBG. Orders followed. CBG 99. MD stated that he would be up to see the patient. Will continue to monitor.

## 2017-02-19 NOTE — Progress Notes (Signed)
Pharmacy Antibiotic Note  Brandy Dudley is a 66 y.o. female admitted on 02/16/2017 with intra-abdominal infection.  Patient has ESRD on HD TTSat- last HD session 3/24. To switch to Levaquin to complete a 7 day course of antibiotics.   Plan: Levaquin 750mg  IV x1 today, then 500mg  IV q48h- last dose due 3/28 Follow c/s, HD schedule/tolerance  Height: 5\' 4"  (162.6 cm) Weight: 147 lb (66.7 kg) IBW/kg (Calculated) : 54.7  Temp (24hrs), Avg:98 F (36.7 C), Min:97.5 F (36.4 C), Max:98.5 F (36.9 C)   Recent Labs Lab 02/12/17 1913 02/16/17 1644 02/16/17 1657 02/16/17 2028 02/17/17 0356 02/18/17 0415 02/19/17 0528  WBC 9.8 9.4  --   --  11.0* 8.5  --   CREATININE 4.87* 3.91*  --   --  4.31* 2.46* 3.62*  LATICACIDVEN  --   --  1.08 1.06  --   --   --     Estimated Creatinine Clearance: 14.4 mL/min (A) (by C-G formula based on SCr of 3.62 mg/dL (H)).    Allergies  Allergen Reactions  . Entresto [Sacubitril-Valsartan] Other (See Comments)     unknown reaction  . Latex Itching  . Sulfa Antibiotics Itching   Vancomycin 3/23>>3/26 Zosyn 3/23>>3/26 LVQ 3/26>>3/28  3/23 blood: ngtd  Chistopher Mangino D. Gleason Ardoin, PharmD, BCPS Clinical Pharmacist Pager: [phone] 02/19/2017 10:19 AM

## 2017-02-19 NOTE — Significant Event (Signed)
Rapid Response Event Note  Overview: Time Called: 2047 Arrival Time: 2050 Event Type: Neurologic  Initial Focused Assessment:   Called by Lanna Poche, RN for unresponsive pt in [phone].  RN reported that pt had jerking movements briefly and was unresponsive.   On arrival pt appeared postictal on exam and was not following commands.  VSS stable, airway intact, no active seizures noted.  Respirations CTA, systolic murmur noted, NSR on monitor, skin warm and dry.   Approximately 10 minutes following my arrival, pt became more alert and regained orientation, complaining of generalized pain.  BP:  116/58, HR 86, RR 16, 100% Spo2 on RA.  Interventions: CBG 91, EKG NSR rate 79, no acute findings. Schorr, NP paged x 2 with no response.  Dr. Antionette Char was notified at 2130 and stated he would come to see the pt.    2236: pt had repeat seizure with postictal period.  Merdis Delay, NP notified by primary RN, prn Ativan 0.5 mg given.  Dr. Otelia Limes with neurology to see patient.     Plan of Care (if not transferred): Neurology consulted  Event Summary: Name of Physician Notified: Tama Gander, NP at 2055  Name of Consulting Physician Notified: Dr. Antionette Char at 2130        [facility], [place]

## 2017-02-19 NOTE — Progress Notes (Signed)
Patient c/o hiccups and L abd pain. Text paged MD on call,order received. Will continue to monitor. [place], Drinda Butts, Charity fundraiser

## 2017-02-19 NOTE — Progress Notes (Signed)
Patient has CVC triple lumen in L neck. MD notified. Dhungel, MD stated that this could be removed upon patients D/C. Orders to be placed by MD. Will continue to monitor.

## 2017-02-19 NOTE — Progress Notes (Signed)
Called to room by NT because patient is "out of it". When RN went in the room,patient's eyes open with blank stare,not answering any questions, with regular breathing. Patient incontinent of stool.VSS. Rapid response RN was called. MD on call was called by rapid response. Will continue to monitor. Olanda Boughner, Drinda Butts, Charity fundraiser

## 2017-02-19 NOTE — Progress Notes (Signed)
Subjective:   Feels stable with some soreness at tube site/ Musculoskeletal type Left sided ,noted plans for dc to [facility]   Objective Vital signs in last 24 hours: Vitals:   02/18/17 2029 02/19/17 0518 02/19/17 0736 02/19/17 0737  BP:  (!) 103/44    Pulse:  73    Resp:  17    Temp:  98.5 F (36.9 C)    TempSrc:  Oral    SpO2: 95% 99% 98% 98%  Weight:      Height:       Weight change: 4.879 kg (10 lb 12.1 oz)  Physical Exam: General: alert NAD Elderly AAF, calm , OX3 Heart: RRR no mur, Rub or gallop  Lungs: CTA bilat Abdomen: BS pos. R Quad drainage tube with drainage bag/ non distended, nontender Extremities: no pedal edema Dialysis Access: L fem  avgg Pos bruit / R IJ Perm cath    OP [facility]   400/800  67kg   2/2.25  P2  R IJ cath/ maturing L thigh AVG placed 02/12/17  Dr. Randie Heinz /  Hep 6000 - mircera 225 last 3/8, Fe 50/wk, no vdra - labs: Hb9.8  tsat 19%  , had course of Fe recently, ipth 158      Problem/Plan: 1. Abd pain and hypotension / recent perc chole tube for acute cholecysititis - on empiric abx for suspected sepsis.  To complete on 02/22/17 po Levaquin  2. Volume - is down 4-5kg under dry wt, no vol excess 3. 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 Anemia of CKD-  hgb 7.4 on 3/25 redosed ESA 200 darbe on 3/24. Fu  hgb trend as op  6. Hx COPD 7. PAF - on amio/ MTP 25 XL at home 8. DM - off insulin now   Lenny Pastel, PA-C [facility] Beeper [phone] 02/19/2017,11:38 AM  LOS: 2 days    Renal Attending: As above.  She is stable and will plan for next HD on Tuesday. Ayriana Wix C  Labs: Basic Metabolic Panel:  Recent Labs Lab 02/17/17 0356 02/18/17 0415 02/19/17 0528  NA 133* 138 137  K 3.7 3.5 3.3*  CL 91* 99* 98*  CO2 24 29 27   GLUCOSE 157* 84 84  BUN 25* 9 15  CREATININE 4.31* 2.46* 3.62*  CALCIUM 8.1* 8.0* 8.1*  PHOS 6.4*  --   --    Liver Function Tests:  Recent  Labs Lab 02/12/17 1913 02/16/17 1644  AST 12* 16  ALT 7* 7*  ALKPHOS 121 141*  BILITOT 0.3 1.2  PROT 7.9 8.3*  ALBUMIN 1.8* 1.6*    Recent Labs Lab 02/16/17 1644  LIPASE <10*   No results for input(s): AMMONIA in the last 168 hours. CBC:  Recent Labs Lab 02/12/17 1913 02/16/17 1644 02/17/17 0356 02/18/17 0415  WBC 9.8 9.4 11.0* 8.5  NEUTROABS  --  7.1 9.0*  --   HGB 9.2* 8.6* 9.2* 7.4*  HCT 31.6* 29.7* 31.1* 25.7*  MCV 83.4 82.3 81.6 82.1  PLT 220 264 291 204   Cardiac Enzymes: No results for input(s): CKTOTAL, CKMB, CKMBINDEX, TROPONINI in the last 168 hours. CBG:  Recent Labs Lab 02/18/17 0727 02/18/17 1159 02/18/17 1601 02/18/17 2018 02/19/17 0752  GLUCAP 80 101* 113* 91 84    Studies/Results: No results found. Medications:  . amiodarone  200 mg Oral Daily  . darbepoetin (ARANESP) injection - DIALYSIS  200  mcg Intravenous Q Sat-HD  . feeding supplement (NEPRO CARB STEADY)  237 mL Oral BID BM  . heparin subcutaneous  5,000 Units Subcutaneous Q8H  . levofloxacin (LEVAQUIN) IV  750 mg Intravenous Once   Followed by  . [START ON 02/21/2017] levofloxacin (LEVAQUIN) IV  500 mg Intravenous Q48H  . lidocaine  1 patch Transdermal Q24H  . mometasone-formoterol  2 puff Inhalation BID  . multivitamin  1 tablet Oral QHS  . tiotropium  18 mcg Inhalation Daily

## 2017-02-19 NOTE — Progress Notes (Signed)
Patient currently has no IV access. MD notified. Changed order from IV to PO phenergan. Will continue to monitor.

## 2017-02-19 NOTE — Discharge Summary (Addendum)
Physician Discharge Summary  Brandy Dudley RUE:[id number] DOB: 14-Sep-1951 DOA: 02/16/2017  PCP: Brandy Grout, MD  Admit date: 02/16/2017 Discharge date: 02/19/2017  Admitted From: SNF Disposition:  SNF [name]  Recommendations for Outpatient Follow-up:  1. Follow up with MD at SNF in 1 week 2. Ensure patient does not get hypotension  Home Health:none Equipment/Devices: none  Discharge Condition: fair CODE STATUS: full code Diet recommendation: Heart Healthy / Carb Modified / Renal  Discharge Diagnoses:  Principal Problem:   Shock (HCC)  Active Problems:   Physical deconditioning   Cardiomyopathy, ischemic   Diabetes mellitus type 2 in obese (HCC)   AICD (automatic cardioverter/defibrillator) present   ESRD on dialysis (HCC)   Anemia of chronic renal failure, stage 5 (HCC)   Chronic combined systolic and diastolic CHF (congestive heart failure) (HCC)   Protein-calorie malnutrition, severe   Acute cholecystitis   Hypotension   Brief narrative/ HPI Please refer to admission H&P for details, 66 year old female with history of ESRD on hemodialysis (TTS), CAD status post PCI, ischemic cardiomyopathy with EF of 30% status post AICD, diabetes mellitus on insulin, DVT on anticoagulation, recent hospitalization for acute cholecystitis with cholecystostomy tube placed by IR who became hypotensive during dialysis. She also complained of left upper quadrant abdominal pain. On presentation she was hypertensive and given 500 mL normal saline bolus and admitted to ICU. Lactic acid was normal while pro calcitonin was high. Patient placed on empiric antibiotics and monitored in ICU. Required pressor support with norepinephrine which was weaned off. She was hemodynamically stable and transferred to hospitalist service on 3/25.  While in house was concerned about possible seizure activity. Was evaluated by neurology who did not feel patient was having seizures.  Hospital  course Hypovolemic versus septic shock. Resolved. Blood Cultures negative. On empiric antibiotic given hypotension and sepsis not entirely ruled out. Completed antibiotic course  Seizure-like activity - Again was evaluated by neurology who did not feel patient was having real seizures. Recommended avoiding hypotension discharging patient on Midrin and discontinuing beta blocker  Coronary artery disease with history of CHF status post AICD.  Reports left-sided chest pain which is musculoskeletal and reproducible on pressure. Continue statin and beta blocker. Applied lidoderm patch.  Diabetes mellitus type 2 CBG will controlled. Resume sliding scale coverage.  A Fib Rate controlled. Continue amiodarone. Discontinued beta blocker secondary to hypotension  ESRD on HD (TTS) Renal following. Tolerated dialysis on 3/24.  History of remote DVT No longer on anticoagulant.  History of COPD Resume Dulera and Spiriva.  Recent cholecystitis status post cholecystostomy tube. Cholecystostomy tube appears to function well with good drainage. Follow with IR as scheduled.   Consults: PC CM Nephrology  Procedures: None  Family communication: None at bedside  Disposition: Return to SNF  Discharge Instructions   Allergies as of 02/19/2017      Reactions   Entresto [sacubitril-valsartan] Other (See Comments)    unknown reaction   Latex Itching   Sulfa Antibiotics Itching      Medication List    TAKE these medications   acetaminophen 325 MG tablet Commonly known as:  TYLENOL Take 325 mg by mouth every 6 (six) hours as needed for mild pain or fever (For fever >99.5). Take 650 mg by mouth q6h prn for mild pain   amiodarone 200 MG tablet Commonly known as:  PACERONE Take 1 tablet (200 mg total) by mouth daily.   atorvastatin 10 MG tablet Commonly known as:  LIPITOR Take 10 mg by  mouth at bedtime.   CERTAVITE/ANTIOXIDANTS Tabs Take 1 tablet by mouth at bedtime.   famotidine  20 MG tablet Commonly known as:  PEPCID Take 1 tablet (20 mg total) by mouth at bedtime.   FIBER CHOICE PO Take 2 tablets by mouth daily as needed (Constipation).   fluticasone 110 MCG/ACT inhaler Commonly known as:  FLOVENT HFA Inhale 2 puffs into the lungs 2 (two) times daily.   hydrOXYzine 50 MG tablet Commonly known as:  ATARAX/VISTARIL Take 50 mg by mouth every 6 (six) hours as needed for itching.   insulin glargine 100 UNIT/ML injection Commonly known as:  LANTUS Inject 8 Units into the skin every morning.   insulin lispro 100 UNIT/ML injection Commonly known as:  HUMALOG Inject 0-10 Units into the skin 3 (three) times daily as needed for high blood sugar (CBG >180). CBG 0-59 Hypoglycemic Protocol, 60-149 0 units 150-250 5 units, 251-300 8 units, 301-350 10 units, >350 call MD   lactose free nutrition Liqd Take 237 mLs by mouth daily.   ENSURE CLEAR Liqd Take 1 each by mouth daily. For 14 days   levofloxacin 500 MG tablet Commonly known as:  LEVAQUIN Take 1 tablet (500 mg total) by mouth once. Start taking on:  02/21/2017   lidocaine 5 % Commonly known as:  LIDODERM Place 1 patch onto the skin daily. Remove & Discard patch within 12 hours or as directed by MD Start taking on:  02/20/2017   loperamide 2 MG capsule Commonly known as:  IMODIUM Take 1 capsule (2 mg total) by mouth 4 (four) times daily as needed for diarrhea or loose stools.   metoprolol succinate 25 MG 24 hr tablet Commonly known as:  TOPROL-XL Take 12.5 mg by mouth 2 (two) times daily. HOLD for SBP <110 and HP <60   mirtazapine 15 MG tablet Commonly known as:  REMERON Take 15 mg by mouth at bedtime.   nitroGLYCERIN 0.4 MG SL tablet Commonly known as:  NITROSTAT Place 0.4 mg under the tongue every 5 (five) minutes as needed for chest pain.   oxyCODONE-acetaminophen 5-325 MG tablet Commonly known as:  PERCOCET/ROXICET Take 1-2 tablets by mouth every 6 (six) hours as needed for moderate pain or  severe pain.   OXYGEN Inhale 2 L into the lungs as needed.   PROAIR HFA 108 (90 Base) MCG/ACT inhaler Generic drug:  albuterol Inhale 2 puffs into the lungs every 6 (six) hours as needed for wheezing or shortness of breath.   sertraline 25 MG tablet Commonly known as:  ZOLOFT Take 25 mg by mouth daily.   SYSTANE BALANCE 0.6 % Soln Generic drug:  Propylene Glycol Place 1 drop into both eyes 2 (two) times daily.      Follow-up Information    MD at SNF in 1 week Follow up.          Allergies  Allergen Reactions  . Entresto [Sacubitril-Valsartan] Other (See Comments)     unknown reaction  . Latex Itching  . Sulfa Antibiotics Itching        Procedures/Studies: Dg Chest 2 View  Result Date: 02/16/2017 CLINICAL DATA:  Confusion and hypotension EXAM: CHEST  2 VIEW COMPARISON:  09/16/2016 FINDINGS: Dialysis catheter is noted in the right jugular vein in satisfactory position. This is new from the prior exam. A defibrillator is again seen and stable. Cardiomegaly is again noted. Mild vascular congestion is seen likely of a chronic nature. No focal infiltrate or sizable effusion is seen. IMPRESSION: Mild vascular  congestion which may be related to volume overload. No other focal abnormality is seen. Electronically Signed   By: Alcide Clever M.D.   On: 02/16/2017 16:13   Ct Abdomen Pelvis W Contrast  Result Date: 02/16/2017 CLINICAL DATA:  66 year old female with multiple medical comorbidities status post transhepatic percutaneous cholecystostomy tube on 02/02/2017. Abdominal pain and hypotension. Initial encounter. EXAM: CT ABDOMEN AND PELVIS WITH CONTRAST TECHNIQUE: Multidetector CT imaging of the abdomen and pelvis was performed using the standard protocol following bolus administration of intravenous contrast. CONTRAST:  ISOVUE-300 IOPAMIDOL (ISOVUE-300) INJECTION 61%, however, it appears the vast majority of a contrast dose extravasated into the left upper extremity as seen  on coronal image 80. COMPARISON:  CT Abdomen and Pelvis 02/01/2017, and earlier. FINDINGS: Essentially this is a noncontrast exam may in light of the IV extravasation into the left upper extremity. Lower chest: Left lower chest wall cardiac AICD type device with streak artifact. Severe cardiomegaly. No pericardial effusion. Continued medial left lower lobe atelectasis or consolidation with superimposed calcified granuloma. Interval improved ventilation at the right lung base with residual peribronchial atelectasis. Virtually resolved bilateral small pleural effusions. Hepatobiliary: Transhepatic percutaneous cholecystostomy tube in place with decompressed gallbladder. No biliary ductal enlargement. No discrete liver lesion. Pancreas: Negative. Spleen: Negative. Adrenals/Urinary Tract: Mild bilateral adrenal gland thickening compatible with hyperplasia stable. The kidneys appears stable without hydronephrosis or perinephric stranding. Diminutive, unremarkable urinary bladder. No abdominal free fluid. Stomach/Bowel: Decreased stool ball in the rectum but increased circumferential rectal wall thickening, up to 19 mm (series 21, image 69). Similar retained stool in the sigmoid colon and left colon. Similar retained stool in the transverse colon and right colon. Negative appendix (series 21, image 61). No dilated small bowel. Negative stomach and duodenum. Vascular/Lymphatic: Extensive Aortoiliac calcified atherosclerosis. Partially visible left femoral region dialysis graft. Poor vascular contrast bolus. Vascular patency not evaluated. Reproductive: Negative. Other: No pelvic free fluid. Rectus muscle diastases with small developing fat and small bowel containing ventral abdominal hernia, non incarcerated. See sagittal image 79. Musculoskeletal: Chronic severe rose to changes throughout the visible lower thoracic spine to the T12 level, not new but progressed since September 2017. However, these bony changes are new  since June of 2017. The superior L1 vertebral body now is being eroded, was intact in September. The erosive changes are accompanied by partially visible pedicle fractures at T7. The visible lower ribs remain intact. Severe lower lumbar facet arthropathy. Pelvis and proximal femurs intact. IMPRESSION: 1. Extravasation of the contrast for this study into the patient left upper extremity near the antecubital fossa. Recommend physical exam evaluation of the IV site, elevation of the extremity, and ice application. Recommend surveillance for progressive skin changes, which if detected should prompt plastic surgery consultation. 2. Progressively abnormal appearance of the visualized thoracic spine with unusual vertebral body erosion which has developed since June of last year. New erosion of the superior T1 vertebral body since September. A pathologic fracture of T7 is suspected but incompletely visualized. 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 3. Right transhepatic percutaneous cholecystostomy tube with no adverse features. 4. Decreased rectal fecal impaction but circumferential rectal wall thickening suggestive of proctitis. However, no other bowel inflammation. No bowel obstruction. 5. Resolved small bilateral pleural effusions since earlier this month. Residual lower lobe atelectasis. 6. Severe cardiomegaly. Extensive calcified aortoiliac atherosclerosis. 7. Salient findings on this exam were discussed by telephone with Dr. Drema Pry on 02/16/2017  at 1930 hours. Electronically Signed   By: Odessa Fleming M.D.   On: 02/16/2017 19:34   Ct Abdomen Pelvis W Contrast  Result Date: 02/01/2017 CLINICAL DATA:  Initial evaluation for right-sided abdominal pain for 1 month. EXAM: CT ABDOMEN AND PELVIS WITH CONTRAST TECHNIQUE: Multidetector CT imaging of the abdomen and pelvis was performed using the standard protocol following bolus  administration of intravenous contrast. CONTRAST:  ISOVUE-300 IOPAMIDOL (ISOVUE-300) INJECTION 61% COMPARISON:  Prior CT from 08/26/2016. FINDINGS: Lower chest: Small layering bilateral pleural effusions with associated bibasilar atelectasis. Cardiomegaly with pacemaker electrodes partially visualized. Hepatobiliary: Few scattered subcentimeter hypodensities noted within the liver, too small the characterize, but may reflect small cyst. Liver otherwise unremarkable. Mucosal edema with mild fuzzy stranding seen about the gallbladder, which may reflect sequelae of acute cholecystitis. Question trace free pericholecystic fluid. No definite internal stones identified. No appreciable biliary dilatation. Pancreas: Pancreas within normal limits. Spleen: Spleen within normal limits. Adrenals/Urinary Tract: Adrenal glands are normal. Kidneys equal in size with symmetric enhancement. Few scattered subcentimeter hypodensities noted within the right kidney, too small the characterize, but statistically likely reflects small cyst. Kidneys otherwise unremarkable without nephrolithiasis, hydronephrosis, or focal enhancing renal mass. No hydroureter. Bladder largely decompressed. Circumferential bladder wall thickening likely related incomplete distension. Stomach/Bowel: Stomach within normal limits. No evidence for bowel obstruction. Appendix well visualized within the right lower quadrant and is within normal limits without associated inflammatory changes to suggest acute appendicitis. No acute inflammatory changes seen about the bowels. Large amount of impacted stool within the rectal vault, suggesting constipation, similar to previous. Vascular/Lymphatic: Moderate aorto bi-iliac atherosclerotic disease. No aneurysm. No pathologically enlarged intra-abdominal or pelvic lymph nodes. Reproductive: Small amount of fluid present within the endometrial canal. Uterus otherwise unremarkable. Ovaries within normal limits. Other:  No free air or fluid. Fat containing paraumbilical hernia noted. With without closely approximated at the base of the hernia without associated inflammation or obstruction. Musculoskeletal: Mild diffuse anasarca noted, likely related to overall volume status. Progressive height loss with endplate irregularity within the visualized vertebral bodies of the thoracic spine, suspected to be related to dialysis/renal osteodystrophy given patient history. No definite acute osseous abnormality. Bilateral facet arthrosis noted within the lower lumbar spine. No other worrisome lytic or blastic osseous lesions. IMPRESSION: 1. Mild mucosal enhancement with small volume free pericholecystic fluid and subtle hazy stranding about the gallbladder. Findings may reflect sequelae of acute cholecystitis. Correlation were laboratory values recommended. Additionally, this could be further assessed with dedicated right upper quadrant ultrasound as indicated. 2. Large amount of impacted stool within the rectal vault, similar to previous. 3. Cardiomegaly with bilateral pleural effusions and diffuse anasarca. Associated bibasilar atelectasis within the lung bases. 4. 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 Electronically Signed   By: Rise Mu M.D.   On: 02/01/2017 23:28   Ir Perc Cholecystostomy  Result Date: 02/02/2017 INDICATION: 66 year old female with numerous medical problems including congestive heart failure, COPD, and end-stage renal disease. She has acute cholecystitis and is not considered a surgical candidate. She presents for placement of a percutaneous cholecystostomy tube. EXAM: CHOLECYSTOSTOMY MEDICATIONS: Patient recently received intravenous Zosyn. No additional antibiotic prophylaxis was administered. ANESTHESIA/SEDATION: Moderate (conscious) sedation was employed during this  procedure. A total of Versed 1 mg and Fentanyl 100 mcg was administered intravenously. Moderate Sedation Time: 10 minutes. The patient's level of consciousness and vital signs were monitored continuously by  radiology nursing throughout the procedure under my direct supervision. FLUOROSCOPY TIME:  Fluoroscopy Time: 0 minutes 54 seconds (3 mGy). COMPLICATIONS: None immediate. PROCEDURE: Informed written consent was obtained from the patient after a thorough discussion of the procedural risks, benefits and alternatives. All questions were addressed. Maximal Sterile Barrier Technique was utilized including caps, mask, sterile gowns, sterile gloves, sterile drape, hand hygiene and skin antiseptic. A timeout was performed prior to the initiation of the procedure. The right upper quadrant was interrogated with ultrasound. The distended gallbladder was successfully identified. The colon was also identified and no was made of its location. Local anesthesia was attained by infiltration with 1% lidocaine. A small dermatotomy was made. Under real-time sonographic guidance, the gallbladder lumen was punctured with a 21 gauge micropuncture needle along a short transhepatic course. Care was taken to avoid the nearby hepatic flexure of the colon. There was return of dark brown cloudy bile through the needle hub. A gentle hand injection of contrast material confirmed opacification of the gallbladder lumen. A 0.018 wire was advanced through the needle and coiled in the gallbladder lumen. The needle was exchanged for the Accustick sheath. The micro wire was then exchanged for a short Amplatz wire. The skin tract was then dilated to 10 [place] and a Cook 10.2 [place] all-purpose drainage catheter was advanced over the wire and formed within the gallbladder. A gentle hand injection of contrast material confirms the tube is located within the gallbladder. The cystic duct is occluded. No definite filling defect to suggest acute  cholecystitis. The cholecystostomy tube was connected to gravity bag drainage and secured to the skin with 0 Prolene suture. IMPRESSION: 1. Successful placement of a transhepatic percutaneous cholecystostomy tube for acute cholecystitis. No definite gallstones are visualized. PLAN: 1. Maintain drain to gravity drainage and flush daily. 2. Follow-up in interventional radiology drain clinic in 4 weeks. 3. Given the patient's medical history, she is unlikely to ever become a surgical candidate. If her cystic duct regains patency in the future we may be able to work toward removal of the drainage catheter. Signed, Sterling Big, MD [facility] Electronically Signed   By: Malachy Moan M.D.   On: 02/02/2017 17:41   Dg Chest Portable 1 View  Result Date: 02/16/2017 CLINICAL DATA:  Central line placement.  Initial encounter. EXAM: PORTABLE CHEST 1 VIEW COMPARISON:  Chest radiograph performed earlier today at 4:02 p.m. FINDINGS: The lungs are well-aerated. Mild vascular congestion is noted. Increased interstitial markings may reflect mild interstitial edema. There is no evidence of pleural effusion or pneumothorax. The cardiomediastinal silhouette is enlarged. A right-sided dual-lumen catheter is noted ending at the right atrium. An AICD is noted overlying the left chest wall, with a lead ending overlying the right ventricle. No acute osseous abnormalities are seen. A left IJ line is noted ending about the distal SVC. IMPRESSION: 1. Left IJ line noted ending about the distal SVC. 2. Mild vascular congestion and cardiomegaly noted. Increased interstitial markings raise concern for mild interstitial edema. Electronically Signed   By: Roanna Raider M.D.   On: 02/16/2017 23:52      Subjective: Complains of some pain underneath her left breast  Discharge Exam: Vitals:   02/18/17 2023 02/19/17 0518  BP: (!) 95/39 (!) 103/44  Pulse: 69 73  Resp:  16 17  Temp: 97.5 F (36.4 C) 98.5 F (36.9 C)   Vitals:   02/18/17 2029 02/19/17 0518 02/19/17 0736 02/19/17 0737  BP:  Marland Kitchen)  103/44    Pulse:  73    Resp:  17    Temp:  98.5 F (36.9 C)    TempSrc:  Oral    SpO2: 95% 99% 98% 98%  Weight:      Height:        General: Elderly female not in distress HEENT: Moist mucosa, supple neck Cardiovascular: Normal S1 and S2, no murmurs rub or gallop Chest: Clear to auscultation bilaterally, reproducible pain underneath the left breast, no rash or shingles GI: Soft, NT, ND, bowel sounds +, cholecystostomy tube draining biliary fluid Musculoskeletal: Warm, no edema     The results of significant diagnostics from this hospitalization (including imaging, microbiology, ancillary and laboratory) are listed below for reference.     Microbiology: Recent Results (from the past 240 hour(s))  Blood Culture (routine x 2)     Status: None (Preliminary result)   Collection Time: 02/16/17  4:44 PM  Result Value Ref Range Status   Specimen Description BLOOD LEFT HAND  Final   Special Requests AEROBIC BOTTLE ONLY 5CC  Final   Culture NO GROWTH 2 DAYS  Final   Report Status PENDING  Incomplete     Labs: BNP (last 3 results)  Recent Labs  03/20/16 1227 07/15/16 1331 02/16/17 1644  BNP 1,489.8* 1,715.6* 2,422.0*   Basic Metabolic Panel:  Recent Labs Lab 02/12/17 1913 02/16/17 1644 02/17/17 0356 02/18/17 0415 02/19/17 0528  NA 139 135 133* 138 137  K 3.6 4.1 3.7 3.5 3.3*  CL 102 91* 91* 99* 98*  CO2 25 24 24 29 27   GLUCOSE 86 76 157* 84 84  BUN 34* 21* 25* 9 15  CREATININE 4.87* 3.91* 4.31* 2.46* 3.62*  CALCIUM 8.3* 8.0* 8.1* 8.0* 8.1*  MG  --   --  2.0 1.9 1.8  PHOS  --   --  6.4*  --   --    Liver Function Tests:  Recent Labs Lab 02/12/17 1913 02/16/17 1644  AST 12* 16  ALT 7* 7*  ALKPHOS 121 141*  BILITOT 0.3 1.2  PROT 7.9 8.3*  ALBUMIN 1.8* 1.6*    Recent Labs Lab 02/16/17 1644  LIPASE <10*   No results  for input(s): AMMONIA in the last 168 hours. CBC:  Recent Labs Lab 02/12/17 1913 02/16/17 1644 02/17/17 0356 02/18/17 0415  WBC 9.8 9.4 11.0* 8.5  NEUTROABS  --  7.1 9.0*  --   HGB 9.2* 8.6* 9.2* 7.4*  HCT 31.6* 29.7* 31.1* 25.7*  MCV 83.4 82.3 81.6 82.1  PLT 220 264 291 204   Cardiac Enzymes: No results for input(s): CKTOTAL, CKMB, CKMBINDEX, TROPONINI in the last 168 hours. BNP: Invalid input(s): POCBNP CBG:  Recent Labs Lab 02/18/17 0727 02/18/17 1159 02/18/17 1601 02/18/17 2018 02/19/17 0752  GLUCAP 80 101* 113* 91 84   D-Dimer No results for input(s): DDIMER in the last 72 hours. Hgb A1c No results for input(s): HGBA1C in the last 72 hours. Lipid Profile No results for input(s): CHOL, HDL, LDLCALC, TRIG, CHOLHDL, LDLDIRECT in the last 72 hours. Thyroid function studies  Recent Labs  02/16/17 1644  TSH 0.640   Anemia work up  Recent Labs  02/17/17 1506  TIBC 143*  IRON 25*   Urinalysis    Component Value Date/Time   COLORURINE AMBER (A) 07/15/2016 1636   APPEARANCEUR TURBID (A) 07/15/2016 1636   LABSPEC 1.025 07/15/2016 1636   PHURINE 5.0 07/15/2016 1636   GLUCOSEU NEGATIVE 07/15/2016 1636   HGBUR MODERATE (A)  07/15/2016 1636   BILIRUBINUR MODERATE (A) 07/15/2016 1636   KETONESUR 15 (A) 07/15/2016 1636   PROTEINUR 100 (A) 07/15/2016 1636   NITRITE NEGATIVE 07/15/2016 1636   LEUKOCYTESUR MODERATE (A) 07/15/2016 1636   Sepsis Labs Invalid input(s): PROCALCITONIN,  WBC,  LACTICIDVEN Microbiology Recent Results (from the past 240 hour(s))  Blood Culture (routine x 2)     Status: None (Preliminary result)   Collection Time: 02/16/17  4:44 PM  Result Value Ref Range Status   Specimen Description BLOOD LEFT HAND  Final   Special Requests AEROBIC BOTTLE ONLY 5CC  Final   Culture NO GROWTH 2 DAYS  Final   Report Status PENDING  Incomplete     Time coordinating discharge: Over 30 minutes  SIGNED:   Eddie North, MD  [facility] 02/19/2017, 10:47 AM Pager   If 7PM-7AM, please contact night-coverage [email or web address] Password [email or web address]

## 2017-02-19 NOTE — Clinical Social Work Note (Addendum)
CSW informed by nurse at 4:20 pm that patient will not discharge today. Brandy Dudley, admissions director at [facility] contacted and informed. Patient's sister, Brandy Dudley had been contacted and message left regarding discharge. Attempted to reach sister again and voicemail box full. Contact made with sister Brandy Dudley [phone]) and advised her that discharge is cancelled and requested that she contact her sister Brandy Dudley regarding the change.   Genelle Bal, MSW, LCSW Licensed Clinical Social Worker Clinical Social Work Department [employer] [phone]

## 2017-02-19 NOTE — Progress Notes (Signed)
Patient having unexplained jerky movements after her triple lumen was removed. Denies any other symptoms ans is fully alert and oriented.the movements  Occur few times a minute with some hiccup -like expressions in the face. Vitals stable except low normal BP. CBG of 99. Labs this am reviewed mildly low k and mg. Check phosphorous.  will hold her discharge and monitor overnight.

## 2017-02-19 NOTE — Progress Notes (Signed)
RN went to round on patient,patient unresponsive,eyes open,just blank stare.Rapid response RN notified.text paged MD on call. Vitals BP 113/43 HR 84 O2 sat 100 on RA. Ativan given as ordered. Will continue to monitor. Brandy Dudley, Drinda Butts, Charity fundraiser

## 2017-02-19 NOTE — Progress Notes (Signed)
Pharmacist Heart Failure Core Measure Documentation  Assessment: Latera Brandy Dudley has an EF documented as 35-40% on 03/21/2016 by 2D echo.  Rationale: Heart failure patients with left ventricular systolic dysfunction (LVSD) and an EF < 40% should be prescribed an angiotensin converting enzyme inhibitor (ACEI) or angiotensin receptor blocker (ARB) at discharge unless a contraindication is documented in the medical record.  This patient is not currently on an ACEI or ARB for HF.  This note is being placed in the record in order to provide documentation that a contraindication to the use of these agents is present for this encounter.  ACE Inhibitor or Angiotensin Receptor Blocker is contraindicated (specify all that apply)  []   ACEI allergy AND ARB allergy []   Angioedema  []   Moderate or severe aortic stenosis []   Hyperkalemia []   Hypotension []   Renal artery stenosis [x]   Worsening renal function, preexisting renal disease or dysfunction   [name] 02/19/2017 1:38 PM

## 2017-02-19 NOTE — Clinical Social Work Note (Signed)
Clinical Social Work Assessment  Patient Details  Name: Brandy Dudley MRN: [id number] Date of Birth: [birth year]  Date of referral:  02/19/17               Reason for consult:  Facility Placement (Patient from [facility])                Permission sought to share information with:  Family Supports Permission granted to share information::  Yes, Verbal Permission Granted  Name::     Brandy Dudley  Agency::     Relationship::  Sister  Contact Information:  [phone]  Housing/Transportation Living arrangements for the past 2 months:  Skilled Nursing Facility (Patient from [facility]) Source of Information:  Patient, Other (Comment Required) (Chart) Patient Interpreter Needed:  None Criminal Activity/Legal Involvement Pertinent to Current Situation/Hospitalization:  No - Comment as needed Significant Relationships:  Siblings Lives with:  Facility Resident [facility] Place) Do you feel safe going back to the place where you live?  Yes Need for family participation in patient care:  Yes (Comment)  Care giving concerns:  Patient expressed no concerns regarding care provided at skilled facility.  Social Worker assessment / plan: CSW talked with patient at the bedside regarding discharge disposition. Brandy Dudley was in bed sitting up and was alert, oriented and open to speaking with CSW. Patient confirmed that she came to hospital from [facility] and plans to return there at discharge.  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  Patient/Family's Understanding of and Emotional Response to Diagnosis, Current Treatment, and Prognosis:  Not  discussed.  Emotional Assessment Appearance:  Appears stated age Attitude/Demeanor/Rapport:  Other (Appropriate) Affect (typically observed):  Appropriate, Pleasant Orientation:  Oriented to Place, Oriented to Self, Oriented to  Time, Oriented to Situation Alcohol / Substance use:  Tobacco Use, Alcohol Use, Illicit Drugs (Patient reported that she quit smoking and does not drink or use illicit drugs) Psych involvement (Current and /or in the community):  No (Comment)  Discharge Needs  Concerns to be addressed:  Discharge Planning Concerns Readmission within the last 30 days:  Yes Current discharge risk:  None Barriers to Discharge:  No Barriers Identified   Cristobal Goldmann, LCSW 02/19/2017, 4:06 PM

## 2017-02-19 NOTE — Progress Notes (Signed)
Upon entering the patients room, the patient was alert, but was unable to speak. Assistance from fellow nurses was given. Lurena Joiner, RN stated that the patient was cool and clammy. CBG checked and was 103. Vital signs were obtained and read as follow: 105, 149/68, and 94% on RA. Rapid Response Nurse, Eunice Blase was notified as well as Dhungel, MD. 2L of O2 was placed on the patient. Rapid Response Nurse at bedside ordered a 12 lead EKG to be performed. However, due to the patient moving all over the bed, this was unable to be obtained. Patient was placed on telemetry per Rapid Response Nurse orders. Dhungel, MD arrived at bedside and ordered for a CXR and abdominal X-Ray to be performed. Also, a Head CT and EEG was also ordered. Also, MD placed order for Ativan, but later stated to hold this D/T the patient becoming more calm and cooperative. After approximately 30 minutes, the patient was A&O X4 and was calm and cooperative. An IV was placed. Patient appears to be back to baseline and is less anxious. CT scan still to be performed. Will continue to monitor.

## 2017-02-19 NOTE — Progress Notes (Signed)
eLink Physician-Brief Progress Note Patient Name: Brandy Dudley DOB: 12-25-1950 MRN: [id number]   Date of Service  02/19/2017  HPI/Events of Note  Recurrence of prior pain perRN  eICU Interventions  Fentanyl 12.21mcg x 1     Intervention Category Intermediate Interventions: Abdominal pain - evaluation and management  Brandy Dudley 02/19/2017, 5:49 AM

## 2017-02-19 NOTE — Consult Note (Signed)
NEURO HOSPITALIST CONSULT NOTE   Requestig physician: Dr. Gonzella Lex  Reason for Consult: Possible new onset seizures  History obtained from:   Chart    HPI:                                                                                                                                          My Rinke is an 66 y.o. female who initially was admitted on 3/24 for management of hypotension. She had previously been admitted for acute cholecystitis which was managed by IR. She has ESRD and is on hemodialysis TTS. Her PMHx also includes CAD s/p PCI, CHF with EF of 30%, AICD, DM, COPD and DVT (on anticoagulation).   She was being prepared for discharge today when nursing staff witnessed an event that was thought to be a seizure. The event involved limb jerking and was followed by AMS that was felt to be postictal. The limb movements were felt more likely to resemble jerks than tremor. Additional events were witnessed. Neurology was called to further evaluate.   Past Medical History:  Diagnosis Date  . AICD (automatic cardioverter/defibrillator) present    Biotronik Inventra 7 VR-T DX 04/12/15  . Anemia   . Anxiety   . C. difficile colitis   . Cardiomyopathy (HCC)    02/02/16 ([facility]): Mixed ischemic and non-ischemic cardiomyopathy  . Charcot's joint of left foot   . CHF (congestive heart failure) (HCC)    systolic  . Cholelithiases 01/2017  . Complication of anesthesia 02/02/2017   has anxeity when awaken with gallbladder surgery  . COPD (chronic obstructive pulmonary disease) (HCC)   . Coronary artery disease   . Diabetes mellitus (HCC)    Type II  . DVT (deep venous thrombosis) (HCC)    on coumadin  . ESRD (end stage renal disease) (HCC)    tues/thurs/sat dialysis  . Hypertension   . Neuropathy (HCC)   . Obstructive sleep apnea    no cpap  . Renal disorder   . Wears glasses     Past Surgical History:  Procedure Laterality Date  . AV FISTULA PLACEMENT  Left 04/03/2016   Procedure: ARTERIOVENOUS (AV) FISTULA CREATION;  Surgeon: Larina Earthly, MD;  Location: [facility] OR;  Service: Vascular;  Laterality: Left;  . AV FISTULA PLACEMENT Right 08/21/2016   Procedure: INSERTION RIGHT ARM ARTERIOVENOUS GRAFT USING 4-7MM X45  CM ACUSEAL GRAFT;  Surgeon: Maeola Harman, MD;  Location: [facility] OR;  Service: Vascular;  Laterality: Right;  . AV FISTULA PLACEMENT Left 02/12/2017   Procedure: INSERTION OF ARTERIOVENOUS (AV) GORE-TEX GRAFT THIGH;  Surgeon: Maeola Harman, MD;  Location: [facility] OR;  Service: Vascular;  Laterality: Left;  Using Goretex 4-80mm Vascular Graft  . CORONARY ANGIOPLASTY     LCX stent  2010; by [facility] 01/2016 note: 03/2014 LHC/RHC: minor luminal irregularities LAD and RCA with patent stent first marginal branch of CX. RA 11, PAP 58/22 with mean 39, mean wedge pressure 19, LVEDP 27, PVR 3.9, CO 5.1, Cardiac index 2.5.   . FISTULA SUPERFICIALIZATION Left 06/16/2016   Procedure: SUPERFICIALIZATION OF LEFT ARM BRACHIOCEPHALIC ARTERIOVENOUS FISTULA;  Surgeon: Chuck Hint, MD;  Location: [facility] OR;  Service: Vascular;  Laterality: Left;  . INSERTION OF DIALYSIS CATHETER Left 04/03/2016   Procedure: INSERTION OF DIALYSIS CATHETER;  Surgeon: Larina Earthly, MD;  Location: [facility] OR;  Service: Vascular;  Laterality: Left;  . IR GENERIC HISTORICAL  02/02/2017   IR PERC CHOLECYSTOSTOMY 02/02/2017 Malachy Moan, MD [facility]-INTERV RAD  . LIGATION OF COMPETING BRANCHES OF ARTERIOVENOUS FISTULA Left 06/16/2016   Procedure: LIGATION OF COMPETING BRANCHES OF left brachiocephalic ARTERIOVENOUS FISTULA;  Surgeon: Chuck Hint, MD;  Location: [facility] OR;  Service: Vascular;  Laterality: Left;  . OTHER SURGICAL HISTORY  02/02/2017   Transhepatic percutaneous cholecystostomy placement  . PERIPHERAL VASCULAR CATHETERIZATION N/A 07/20/2016   Procedure: A/V Shuntogram/Fistulagram;  Surgeon: Fransisco Hertz, MD;  Location: [facility] INVASIVE CV LAB;  Service:  Cardiovascular;  Laterality: N/A;  . PERIPHERAL VASCULAR CATHETERIZATION N/A 07/20/2016   Procedure: Dialysis/Perma Catheter Insertion;  Surgeon: Fransisco Hertz, MD;  Location: [facility] INVASIVE CV LAB;  Service: Cardiovascular;  Laterality: N/A;  . REVISION OF ARTERIOVENOUS GORETEX GRAFT Left 06/16/2016   Procedure: REVISION OF LEFT BRACHIOCEPHALIC ARTERIOVENOUS FISTULA WITH RESECTION OF REDUNDANT SECTION;  Surgeon: Chuck Hint, MD;  Location: [facility] OR;  Service: Vascular;  Laterality: Left;   No family history on file.  Social History:  reports that she quit smoking about 10 years ago. Her smoking use included Cigarettes. She has never used smokeless tobacco. She reports that she does not drink alcohol or use drugs.  Allergies  Allergen Reactions  . Entresto [Sacubitril-Valsartan] Other (See Comments)     unknown reaction  . Latex Itching  . Sulfa Antibiotics Itching    MEDICATIONS:                                                                                                                     Scheduled: . amiodarone  200 mg Oral Daily  . darbepoetin (ARANESP) injection - DIALYSIS  200 mcg Intravenous Q Sat-HD  . feeding supplement (NEPRO CARB STEADY)  237 mL Oral BID BM  . [START ON 02/20/2017] ferric gluconate (FERRLECIT/NULECIT) IV  125 mg Intravenous Q T,Th,Sa-HD  . heparin subcutaneous  5,000 Units Subcutaneous Q8H  . [START ON 02/21/2017] levofloxacin (LEVAQUIN) IV  500 mg Intravenous Q48H  . mometasone-formoterol  2 puff Inhalation BID  . multivitamin  1 tablet Oral QHS  . promethazine  12.5 mg Oral Once  . tiotropium  18 mcg Inhalation Daily    ROS:  History obtained from patient. She has no complaints in the context of her drowsy state.    Blood pressure (!) 113/43, pulse 84, temperature 98.7 F (37.1 C), temperature source Oral, resp. rate  20, height 5\' 4"  (1.626 m), weight 67.7 kg (149 lb 3.2 oz), SpO2 100 %.  HEENT: [place]/AT Lungs: Respirations unlabored Ext: Warm and well perfused  Neurological Examination Mental Status: Drowsy. Oriented to day and location. In the context of poverty of speech, she is fluent with intact naming. She is able to follow all commands.  Cranial Nerves: II:  Visual fields grossly intact, pupils equal III,IV, VI: ptosis not present, EOMI without nystagmus VII: smile symmetric VIII: hearing intact to questions and commands IX,X: no hypophonia XI: No asymmetry XII: midline tongue extension Motor: Right : Upper extremity   4+/5    Left:     Upper extremity   4-/5  Lower extremity   4+/5    Lower extremity   4+/5 Sensory: Light touch intact proximally in all 4 extremities without extinction Deep Tendon Reflexes: Deferred as patient states tapping with reflex hammer is painful Cerebellar: No ataxia with FNF bilaterally Gait: Deferred   Lab Results: Basic Metabolic Panel:  Recent Labs Lab 02/16/17 1644 02/17/17 0356 02/18/17 0415 02/19/17 0528 02/19/17 1544  NA 135 133* 138 137  --   K 4.1 3.7 3.5 3.3*  --   CL 91* 91* 99* 98*  --   CO2 24 24 29 27   --   GLUCOSE 76 157* 84 84  --   BUN 21* 25* 9 15  --   CREATININE 3.91* 4.31* 2.46* 3.62*  --   CALCIUM 8.0* 8.1* 8.0* 8.1*  --   MG  --  2.0 1.9 1.8  --   PHOS  --  6.4*  --   --  3.0    Liver Function Tests:  Recent Labs Lab 02/16/17 1644  AST 16  ALT 7*  ALKPHOS 141*  BILITOT 1.2  PROT 8.3*  ALBUMIN 1.6*    Recent Labs Lab 02/16/17 1644  LIPASE <10*    Recent Labs Lab 02/19/17 1811  AMMONIA 31    CBC:  Recent Labs Lab 02/16/17 1644 02/17/17 0356 02/18/17 0415  WBC 9.4 11.0* 8.5  NEUTROABS 7.1 9.0*  --   HGB 8.6* 9.2* 7.4*  HCT 29.7* 31.1* 25.7*  MCV 82.3 81.6 82.1  PLT 264 291 204    Cardiac Enzymes: No results for input(s): CKTOTAL, CKMB, CKMBINDEX, TROPONINI in the last 168 hours.  Lipid  Panel: No results for input(s): CHOL, TRIG, HDL, CHOLHDL, VLDL, LDLCALC in the last 168 hours.  CBG:  Recent Labs Lab 02/18/17 2018 02/19/17 0752 02/19/17 1526 02/19/17 1652 02/19/17 2050  GLUCAP 91 84 99 103* 92    Microbiology: Results for orders placed or performed during the hospital encounter of 02/16/17  Blood Culture (routine x 2)     Status: None (Preliminary result)   Collection Time: 02/16/17  4:44 PM  Result Value Ref Range Status   Specimen Description BLOOD LEFT HAND  Final   Special Requests AEROBIC BOTTLE ONLY 5CC  Final   Culture NO GROWTH 3 DAYS  Final   Report Status PENDING  Incomplete    Coagulation Studies: No results for input(s): LABPROT, INR in the last 72 hours.  Imaging: Ct Head Wo Contrast  Result Date: 02/19/2017 CLINICAL DATA:  Acute encephalopathy EXAM: CT HEAD WITHOUT CONTRAST TECHNIQUE: Contiguous axial images were obtained from the base of the  skull through the vertex without intravenous contrast. COMPARISON:  None. FINDINGS: Brain: Age-appropriate involutional changes. No acute large vascular territory infarction, hemorrhage nor midline shift. No intra-axial mass nor extra-axial fluid. Minimal small vessel ischemic changes of periventricular white matter. No effacement of the basal cisterns. Fourth ventricle is midline. There is no hydrocephalus. Vascular: Right vertebral and bilateral carotid siphon calcifications. No unexpected calcifications are hyperdense vessels. Skull: Normal. Negative for fracture or focal lesion. Sinuses/Orbits: Trace fluid in the sphenoid sinus. Mild membrane thickening of the ethmoid sinus. Other: Mastoids are clear. IMPRESSION: Chronic appearing small vessel ischemic disease of periventricular white matter. No acute intracranial abnormality. Trace sphenoid sinus fluid. Electronically Signed   By: Tollie Eth M.D.   On: 02/19/2017 19:29   Dg Chest Port 1 View  Result Date: 02/19/2017 CLINICAL DATA:  Seizure-like  activities.  Dyspnea. EXAM: PORTABLE CHEST 1 VIEW COMPARISON:  02/16/2017 CXR FINDINGS: Stable cardiomegaly with aortic atherosclerosis. Dialysis catheter tip terminates in the right atrium. ICD device projects over the left hemithorax with lead in the expected location of the right ventricle. Interval removal of left IJ central line. No overt pulmonary edema, pulmonary consolidation or pneumothorax. 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 No acute osseous abnormality. IMPRESSION: Stable cardiomegaly with aortic atherosclerosis. No acute pneumonic consolidation or CHF. Electronically Signed   By: Tollie Eth M.D.   On: 02/19/2017 17:43    Assessment: 1. Possible new onset seizures.  2. Hypotension. Her spells could also be due to transient cerebral hypoperfusion (can result in convulsive syncope - a non-epileptic phenomenon) if there have been significant dips in her blood pressure.     Recommendations: 1. Unable to perform MRI due to AICD.  2. Video EEG monitoring to capture a spell and determine if there is an electrographic correlate.  3. Seizure precautions.   Electronically signed: Dr. Caryl Pina 02/19/2017, 11:21 PM

## 2017-02-20 ENCOUNTER — Inpatient Hospital Stay (HOSPITAL_COMMUNITY): Payer: Medicare PPO

## 2017-02-20 DIAGNOSIS — G9340 Encephalopathy, unspecified: Secondary | ICD-10-CM

## 2017-02-20 DIAGNOSIS — R569 Unspecified convulsions: Secondary | ICD-10-CM

## 2017-02-20 LAB — CBC
HEMATOCRIT: 27.8 % — AB (ref 36.0–46.0)
HEMOGLOBIN: 8.1 g/dL — AB (ref 12.0–15.0)
MCH: 23.8 pg — AB (ref 26.0–34.0)
MCHC: 29.1 g/dL — AB (ref 30.0–36.0)
MCV: 81.8 fL (ref 78.0–100.0)
Platelets: 228 10*3/uL (ref 150–400)
RBC: 3.4 MIL/uL — AB (ref 3.87–5.11)
RDW: 17.2 % — ABNORMAL HIGH (ref 11.5–15.5)
WBC: 7.7 10*3/uL (ref 4.0–10.5)

## 2017-02-20 LAB — RENAL FUNCTION PANEL
ANION GAP: 13 (ref 5–15)
Albumin: 2.1 g/dL — ABNORMAL LOW (ref 3.5–5.0)
BUN: 19 mg/dL (ref 6–20)
CALCIUM: 8.5 mg/dL — AB (ref 8.9–10.3)
CO2: 26 mmol/L (ref 22–32)
Chloride: 101 mmol/L (ref 101–111)
Creatinine, Ser: 4.63 mg/dL — ABNORMAL HIGH (ref 0.44–1.00)
GFR calc non Af Amer: 9 mL/min — ABNORMAL LOW (ref >60)
GFR, EST AFRICAN AMERICAN: 10 mL/min — AB (ref >60)
Glucose, Bld: 74 mg/dL (ref 65–99)
PHOSPHORUS: 3.4 mg/dL (ref 2.5–4.6)
POTASSIUM: 3.2 mmol/L — AB (ref 3.5–5.1)
SODIUM: 140 mmol/L (ref 135–145)

## 2017-02-20 LAB — T4: T4, Total: 10.5 ug/dL (ref 4.5–12.0)

## 2017-02-20 MED ORDER — ACETAMINOPHEN 325 MG PO TABS
650.0000 mg | ORAL_TABLET | Freq: Four times a day (QID) | ORAL | Status: DC | PRN
  Administered 2017-02-22 – 2017-02-23 (×2): 650 mg via ORAL
  Filled 2017-02-20 (×2): qty 2

## 2017-02-20 MED ORDER — SODIUM CHLORIDE 0.9 % IV BOLUS (SEPSIS)
250.0000 mL | Freq: Once | INTRAVENOUS | Status: AC
  Administered 2017-02-20: 250 mL via INTRAVENOUS

## 2017-02-20 MED ORDER — HEPARIN SODIUM (PORCINE) 1000 UNIT/ML DIALYSIS: 20.0000 [IU]/kg | INTRAMUSCULAR | Status: DC | PRN

## 2017-02-20 MED ORDER — LIDOCAINE 5 % EX PTCH
1.0000 | MEDICATED_PATCH | CUTANEOUS | Status: DC
  Administered 2017-02-20 – 2017-02-23 (×4): 1 via TRANSDERMAL
  Filled 2017-02-20 (×5): qty 1

## 2017-02-20 NOTE — Progress Notes (Signed)
PROGRESS NOTE                                                                                                                                                                                                             Patient Demographics:    Brandy Dudley, is a 66 y.o. female, DOB - [birth year], ZOX:[id number]  Admit date - 02/16/2017   Admitting Physician Leslye Peer, MD  Outpatient Primary MD for the patient is [name], MD  LOS - 3  Outpatient Specialists: renal  Chief Complaint  Patient presents with  . Hypotension       Brief Narrative  66 year old female with history of ESRD on hemodialysis (TTS), CAD status post PCI, ischemic cardiomyopathy with EF of 30% status post AICD, diabetes mellitus on insulin, DVT on anticoagulation, recent hospitalization for acute cholecystitis with cholecystostomy tube placed by IR who became hypotensive during dialysis. She also complained of left upper quadrant abdominal pain. On presentation she was hypertensive and given 500 mL normal saline bolus and admitted to ICU. Lactic acid was normal while pro calcitonin was high. Patient placed on empiric antibiotics and monitored in ICU. Required pressor support with norepinephrine which was weaned off. Patient was transferred to hospitalist service and 60/25. Remained stable and was plan to be discharged back to SNF on 3/26 however developed jerky movements and acutely became confused. Discharge was held and was being monitored overnight but again had witnessed seizure-like activity during the evening of 3/26.   Subjective:   Patient seen during hemodialysis. Denies any symptoms. Rapid response was called twice last evening due to see possible seizure-like activity. Neurology consulted.   Assessment  & Plan :   Principle problem Hypovolemic versus septic shock. Resolved. Blood Cultures negative. Has chronic low blood pressure. On  empiric antibiotic given hypotension and sepsis not entirely ruled out. Switched antibiotics to Levaquin to complete total 7 day course.  ? Acute seizures On 3/26 patient was being planned to be discharged back to SNF but developed acute jerky movements. Discharge held. Head CT was unremarkable. She  became acutely confused for about 10 minutes and then returned to baseline. Later in the evening she again was found to have jerky movements and became poorly responsive. -Neurology was consulted and suggested this could be new onset seizures versus transient cerebral hypoperfusion due to hypotension. -  Recommended video EEG monitoring. Due to AICD she cannot have a brain MRI. When necessary Ativan for seizures. Further recommendations per neurology.    Coronary artery disease with history of CHF status post AICD.  Reports left-sided chest pain which is musculoskeletal and reproducible on pressure. Continue statin and beta blocker. Received Lidoderm patch briefly. Continue home pain medications.  Diabetes mellitus type 2 CBG well controlled. Resume home dose Lantus and sliding scale coverage.  A Fib Rate controlled. Continue amiodarone.  ESRD on HD (TTS) Renal following. Tolerated dialysis on 3/24.  History of remote DVT No longer on anticoagulant.  History of COPD Resume Dulera and Spiriva.  Recent cholecystitis status post cholecystostomy tube. Cholecystostomy tube appears to function well with good drainage. Follow with IR as scheduled.   Consults: PCCM Nephrology  Procedures:    head CT EEG  Family communication: None at bedside  Disposition: Return to SNF possibly in the next 24-48 hours if seizure-free    Lab Results  Component Value Date   PLT 228 02/20/2017    Antibiotics  :    Anti-infectives    Start     Dose/Rate Route Frequency Ordered Stop   02/21/17 1200  levofloxacin (LEVAQUIN) IVPB 500 mg     500 mg 100 mL/hr over 60 Minutes  Intravenous Every 48 hours 02/19/17 1020 02/23/17 1159   02/21/17 0000  levofloxacin (LEVAQUIN) 500 MG tablet     500 mg Oral  Once 02/19/17 1046 02/21/17 2359   02/20/17 1200  vancomycin (VANCOCIN) IVPB 750 mg/150 ml premix  Status:  Discontinued     750 mg 150 mL/hr over 60 Minutes Intravenous Every T-Th-Sa (Hemodialysis) 02/18/17 0927 02/19/17 1001   02/19/17 1200  levofloxacin (LEVAQUIN) IVPB 750 mg     750 mg 100 mL/hr over 90 Minutes Intravenous  Once 02/19/17 1020 02/19/17 1328   02/18/17 1000  vancomycin (VANCOCIN) IVPB 750 mg/150 ml premix     750 mg 150 mL/hr over 60 Minutes Intravenous  Once 02/18/17 0927 02/18/17 1134   02/16/17 2000  vancomycin (VANCOCIN) 1,500 mg in sodium chloride 0.9 % 500 mL IVPB     1,500 mg 250 mL/hr over 120 Minutes Intravenous  Once 02/16/17 1945 02/17/17 0300   02/16/17 1600  piperacillin-tazobactam (ZOSYN) IVPB 3.375 g  Status:  Discontinued     3.375 g 12.5 mL/hr over 240 Minutes Intravenous Every 12 hours 02/16/17 1538 02/19/17 1001        Objective:   Vitals:   02/20/17 1030 02/20/17 1100 02/20/17 1130 02/20/17 1200  BP: (!) 112/55 (!) 108/55 (!) 110/52 (!) 109/52  Pulse: 72 72 73 73  Resp: 18 19 19 20   Temp:      TempSrc:      SpO2:      Weight:      Height:        Wt Readings from Last 3 Encounters:  02/20/17 61.9 kg (136 lb 7.4 oz)  02/16/17 74.4 kg (164 lb)  02/15/17 74.4 kg (164 lb)     Intake/Output Summary (Last 24 hours) at 02/20/17 1248 Last data filed at 02/20/17 0600  Gross per 24 hour  Intake              350 ml  Output              326 ml  Net               24 ml     Physical Exam General: not  in distress HEENT: Moist mucosa, supple neck Cardiovascular: Normal S1 and S2, no murmurs rub or gallop Chest: Clear to auscultation bilaterally, reproducible pain underneath the left breast, no rash GI: Soft, NT, ND, bowel sounds +, cholecystostomy tube draining biliary fluid Musculoskeletal: Warm, no  edema       Data Review:    CBC  Recent Labs Lab 02/16/17 1644 02/17/17 0356 02/18/17 0415 02/20/17 0845  WBC 9.4 11.0* 8.5 7.7  HGB 8.6* 9.2* 7.4* 8.1*  HCT 29.7* 31.1* 25.7* 27.8*  PLT 264 291 204 228  MCV 82.3 81.6 82.1 81.8  MCH 23.8* 24.1* 23.6* 23.8*  MCHC 29.0* 29.6* 28.8* 29.1*  RDW 17.0* 17.1* 17.2* 17.2*  LYMPHSABS 1.1 1.2  --   --   MONOABS 1.1* 0.6  --   --   EOSABS 0.0 0.1  --   --   BASOSABS 0.0 0.0  --   --     Chemistries   Recent Labs Lab 02/16/17 1644 02/17/17 0356 02/18/17 0415 02/19/17 0528 02/20/17 0845  NA 135 133* 138 137 140  K 4.1 3.7 3.5 3.3* 3.2*  CL 91* 91* 99* 98* 101  CO2 24 24 29 27 26   GLUCOSE 76 157* 84 84 74  BUN 21* 25* 9 15 19   CREATININE 3.91* 4.31* 2.46* 3.62* 4.63*  CALCIUM 8.0* 8.1* 8.0* 8.1* 8.5*  MG  --  2.0 1.9 1.8  --   AST 16  --   --   --   --   ALT 7*  --   --   --   --   ALKPHOS 141*  --   --   --   --   BILITOT 1.2  --   --   --   --    ------------------------------------------------------------------------------------------------------------------ No results for input(s): CHOL, HDL, LDLCALC, TRIG, CHOLHDL, LDLDIRECT in the last 72 hours.  Lab Results  Component Value Date   HGBA1C 5 12/25/2016   ------------------------------------------------------------------------------------------------------------------ No results for input(s): TSH, T4TOTAL, T3FREE, THYROIDAB in the last 72 hours.  Invalid input(s): FREET3 ------------------------------------------------------------------------------------------------------------------  Recent Labs  02/17/17 1506  TIBC 143*  IRON 25*    Coagulation profile No results for input(s): INR, PROTIME in the last 168 hours.  No results for input(s): DDIMER in the last 72 hours.  Cardiac Enzymes No results for input(s): CKMB, TROPONINI, MYOGLOBIN in the last 168 hours.  Invalid input(s):  CK ------------------------------------------------------------------------------------------------------------------    Component Value Date/Time   BNP 2,422.0 (H) 02/16/2017 1644    Inpatient Medications  Scheduled Meds: . amiodarone  200 mg Oral Daily  . darbepoetin (ARANESP) injection - DIALYSIS  200 mcg Intravenous Q Sat-HD  . feeding supplement (NEPRO CARB STEADY)  237 mL Oral BID BM  . ferric gluconate (FERRLECIT/NULECIT) IV  125 mg Intravenous Q T,Th,Sa-HD  . heparin subcutaneous  5,000 Units Subcutaneous Q8H  . [START ON 02/21/2017] levofloxacin (LEVAQUIN) IV  500 mg Intravenous Q48H  . mometasone-formoterol  2 puff Inhalation BID  . multivitamin  1 tablet Oral QHS  . promethazine  12.5 mg Oral Once  . tiotropium  18 mcg Inhalation Daily   Continuous Infusions: PRN Meds:.sodium chloride, sodium chloride, sodium chloride, alteplase, chlorproMAZINE, heparin, heparin, heparin, lidocaine (PF), lidocaine-prilocaine, LORazepam, pentafluoroprop-tetrafluoroeth  Micro Results Recent Results (from the past 240 hour(s))  Blood Culture (routine x 2)     Status: None (Preliminary result)   Collection Time: 02/16/17  4:44 PM  Result Value Ref Range Status   Specimen Description BLOOD  LEFT HAND  Final   Special Requests AEROBIC BOTTLE ONLY 5CC  Final   Culture NO GROWTH 3 DAYS  Final   Report Status PENDING  Incomplete    Radiology Reports Dg Chest 2 View  Result Date: 02/16/2017 CLINICAL DATA:  Confusion and hypotension EXAM: CHEST  2 VIEW COMPARISON:  09/16/2016 FINDINGS: Dialysis catheter is noted in the right jugular vein in satisfactory position. This is new from the prior exam. A defibrillator is again seen and stable. Cardiomegaly is again noted. Mild vascular congestion is seen likely of a chronic nature. No focal infiltrate or sizable effusion is seen. IMPRESSION: Mild vascular congestion which may be related to volume overload. No other focal abnormality is seen.  Electronically Signed   By: Alcide Clever M.D.   On: 02/16/2017 16:13   Ct Head Wo Contrast  Result Date: 02/19/2017 CLINICAL DATA:  Acute encephalopathy EXAM: CT HEAD WITHOUT CONTRAST TECHNIQUE: Contiguous axial images were obtained from the base of the skull through the vertex without intravenous contrast. COMPARISON:  None. FINDINGS: Brain: Age-appropriate involutional changes. No acute large vascular territory infarction, hemorrhage nor midline shift. No intra-axial mass nor extra-axial fluid. Minimal small vessel ischemic changes of periventricular white matter. No effacement of the basal cisterns. Fourth ventricle is midline. There is no hydrocephalus. Vascular: Right vertebral and bilateral carotid siphon calcifications. No unexpected calcifications are hyperdense vessels. Skull: Normal. Negative for fracture or focal lesion. Sinuses/Orbits: Trace fluid in the sphenoid sinus. Mild membrane thickening of the ethmoid sinus. Other: Mastoids are clear. IMPRESSION: Chronic appearing small vessel ischemic disease of periventricular white matter. No acute intracranial abnormality. Trace sphenoid sinus fluid. Electronically Signed   By: Tollie Eth M.D.   On: 02/19/2017 19:29   Ct Abdomen Pelvis W Contrast  Result Date: 02/16/2017 CLINICAL DATA:  66 year old female with multiple medical comorbidities status post transhepatic percutaneous cholecystostomy tube on 02/02/2017. Abdominal pain and hypotension. Initial encounter. EXAM: CT ABDOMEN AND PELVIS WITH CONTRAST TECHNIQUE: Multidetector CT imaging of the abdomen and pelvis was performed using the standard protocol following bolus administration of intravenous contrast. CONTRAST:  ISOVUE-300 IOPAMIDOL (ISOVUE-300) INJECTION 61%, however, it appears the vast majority of a contrast dose extravasated into the left upper extremity as seen on coronal image 80. COMPARISON:  CT Abdomen and Pelvis 02/01/2017, and earlier. FINDINGS: Essentially this is a  noncontrast exam may in light of the IV extravasation into the left upper extremity. Lower chest: Left lower chest wall cardiac AICD type device with streak artifact. Severe cardiomegaly. No pericardial effusion. Continued medial left lower lobe atelectasis or consolidation with superimposed calcified granuloma. Interval improved ventilation at the right lung base with residual peribronchial atelectasis. Virtually resolved bilateral small pleural effusions. Hepatobiliary: Transhepatic percutaneous cholecystostomy tube in place with decompressed gallbladder. No biliary ductal enlargement. No discrete liver lesion. Pancreas: Negative. Spleen: Negative. Adrenals/Urinary Tract: Mild bilateral adrenal gland thickening compatible with hyperplasia stable. The kidneys appears stable without hydronephrosis or perinephric stranding. Diminutive, unremarkable urinary bladder. No abdominal free fluid. Stomach/Bowel: Decreased stool ball in the rectum but increased circumferential rectal wall thickening, up to 19 mm (series 21, image 69). Similar retained stool in the sigmoid colon and left colon. Similar retained stool in the transverse colon and right colon. Negative appendix (series 21, image 61). No dilated small bowel. Negative stomach and duodenum. Vascular/Lymphatic: Extensive Aortoiliac calcified atherosclerosis. Partially visible left femoral region dialysis graft. Poor vascular contrast bolus. Vascular patency not evaluated. Reproductive: Negative. Other: No pelvic free fluid. Rectus muscle diastases  with small developing fat and small bowel containing ventral abdominal hernia, non incarcerated. See sagittal image 79. Musculoskeletal: Chronic severe rose to changes throughout the visible lower thoracic spine to the T12 level, not new but progressed since September 2017. However, these bony changes are new since June of 2017. The superior L1 vertebral body now is being eroded, was intact in September. The erosive changes  are accompanied by partially visible pedicle fractures at T7. The visible lower ribs remain intact. Severe lower lumbar facet arthropathy. Pelvis and proximal femurs intact. IMPRESSION: 1. Extravasation of the contrast for this study into the patient left upper extremity near the antecubital fossa. Recommend physical exam evaluation of the IV site, elevation of the extremity, and ice application. Recommend surveillance for progressive skin changes, which if detected should prompt plastic surgery consultation. 2. Progressively abnormal appearance of the visualized thoracic spine with unusual vertebral body erosion which has developed since June of last year. New erosion of the superior T1 vertebral body since September. A pathologic fracture of T7 is suspected but incompletely visualized. 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 3. Right transhepatic percutaneous cholecystostomy tube with no adverse features. 4. Decreased rectal fecal impaction but circumferential rectal wall thickening suggestive of proctitis. However, no other bowel inflammation. No bowel obstruction. 5. Resolved small bilateral pleural effusions since earlier this month. Residual lower lobe atelectasis. 6. Severe cardiomegaly. Extensive calcified aortoiliac atherosclerosis. 7. Salient findings on this exam were discussed by telephone with Dr. Drema Pry on 02/16/2017 at 1930 hours. Electronically Signed   By: Odessa Fleming M.D.   On: 02/16/2017 19:34   Ct Abdomen Pelvis W Contrast  Result Date: 02/01/2017 CLINICAL DATA:  Initial evaluation for right-sided abdominal pain for 1 month. EXAM: CT ABDOMEN AND PELVIS WITH CONTRAST TECHNIQUE: Multidetector CT imaging of the abdomen and pelvis was performed using the standard protocol following bolus administration of intravenous contrast. CONTRAST:  ISOVUE-300 IOPAMIDOL (ISOVUE-300) INJECTION 61% COMPARISON:  Prior CT  from 08/26/2016. FINDINGS: Lower chest: Small layering bilateral pleural effusions with associated bibasilar atelectasis. Cardiomegaly with pacemaker electrodes partially visualized. Hepatobiliary: Few scattered subcentimeter hypodensities noted within the liver, too small the characterize, but may reflect small cyst. Liver otherwise unremarkable. Mucosal edema with mild fuzzy stranding seen about the gallbladder, which may reflect sequelae of acute cholecystitis. Question trace free pericholecystic fluid. No definite internal stones identified. No appreciable biliary dilatation. Pancreas: Pancreas within normal limits. Spleen: Spleen within normal limits. Adrenals/Urinary Tract: Adrenal glands are normal. Kidneys equal in size with symmetric enhancement. Few scattered subcentimeter hypodensities noted within the right kidney, too small the characterize, but statistically likely reflects small cyst. Kidneys otherwise unremarkable without nephrolithiasis, hydronephrosis, or focal enhancing renal mass. No hydroureter. Bladder largely decompressed. Circumferential bladder wall thickening likely related incomplete distension. Stomach/Bowel: Stomach within normal limits. No evidence for bowel obstruction. Appendix well visualized within the right lower quadrant and is within normal limits without associated inflammatory changes to suggest acute appendicitis. No acute inflammatory changes seen about the bowels. Large amount of impacted stool within the rectal vault, suggesting constipation, similar to previous. Vascular/Lymphatic: Moderate aorto bi-iliac atherosclerotic disease. No aneurysm. No pathologically enlarged intra-abdominal or pelvic lymph nodes. Reproductive: Small amount of fluid present within the endometrial canal. Uterus otherwise unremarkable. Ovaries within normal limits. Other: No free air or fluid. Fat containing paraumbilical hernia noted. With without closely approximated at the base of the hernia  without associated inflammation or obstruction.  Musculoskeletal: Mild diffuse anasarca noted, likely related to overall volume status. Progressive height loss with endplate irregularity within the visualized vertebral bodies of the thoracic spine, suspected to be related to dialysis/renal osteodystrophy given patient history. No definite acute osseous abnormality. Bilateral facet arthrosis noted within the lower lumbar spine. No other worrisome lytic or blastic osseous lesions. IMPRESSION: 1. Mild mucosal enhancement with small volume free pericholecystic fluid and subtle hazy stranding about the gallbladder. Findings may reflect sequelae of acute cholecystitis. Correlation were laboratory values recommended. Additionally, this could be further assessed with dedicated right upper quadrant ultrasound as indicated. 2. Large amount of impacted stool within the rectal vault, similar to previous. 3. Cardiomegaly with bilateral pleural effusions and diffuse anasarca. Associated bibasilar atelectasis within the lung bases. 4. 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 Electronically Signed   By: Rise Mu M.D.   On: 02/01/2017 23:28   Ir Perc Cholecystostomy  Result Date: 02/02/2017 INDICATION: 66 year old female with numerous medical problems including congestive heart failure, COPD, and end-stage renal disease. She has acute cholecystitis and is not considered a surgical candidate. She presents for placement of a percutaneous cholecystostomy tube. EXAM: CHOLECYSTOSTOMY MEDICATIONS: Patient recently received intravenous Zosyn. No additional antibiotic prophylaxis was administered. ANESTHESIA/SEDATION: Moderate (conscious) sedation was employed during this procedure. A total of Versed 1 mg and Fentanyl 100 mcg was administered intravenously. Moderate Sedation Time: 10 minutes. The  patient's level of consciousness and vital signs were monitored continuously by radiology nursing throughout the procedure under my direct supervision. FLUOROSCOPY TIME:  Fluoroscopy Time: 0 minutes 54 seconds (3 mGy). COMPLICATIONS: None immediate. PROCEDURE: Informed written consent was obtained from the patient after a thorough discussion of the procedural risks, benefits and alternatives. All questions were addressed. Maximal Sterile Barrier Technique was utilized including caps, mask, sterile gowns, sterile gloves, sterile drape, hand hygiene and skin antiseptic. A timeout was performed prior to the initiation of the procedure. The right upper quadrant was interrogated with ultrasound. The distended gallbladder was successfully identified. The colon was also identified and no was made of its location. Local anesthesia was attained by infiltration with 1% lidocaine. A small dermatotomy was made. Under real-time sonographic guidance, the gallbladder lumen was punctured with a 21 gauge micropuncture needle along a short transhepatic course. Care was taken to avoid the nearby hepatic flexure of the colon. There was return of dark brown cloudy bile through the needle hub. A gentle hand injection of contrast material confirmed opacification of the gallbladder lumen. A 0.018 wire was advanced through the needle and coiled in the gallbladder lumen. The needle was exchanged for the Accustick sheath. The micro wire was then exchanged for a short Amplatz wire. The skin tract was then dilated to 10 [place] and a Cook 10.2 [place] all-purpose drainage catheter was advanced over the wire and formed within the gallbladder. A gentle hand injection of contrast material confirms the tube is located within the gallbladder. The cystic duct is occluded. No definite filling defect to suggest acute cholecystitis. The cholecystostomy tube was connected to gravity bag drainage and secured to the skin with 0 Prolene suture. IMPRESSION: 1.  Successful placement of a transhepatic percutaneous cholecystostomy tube for acute cholecystitis. No definite gallstones are visualized. PLAN: 1. Maintain drain to gravity drainage and flush daily. 2. Follow-up in interventional radiology drain clinic in 4 weeks. 3. Given the patient's medical history, she is unlikely to ever become  a surgical candidate. If her cystic duct regains patency in the future we may be able to work toward removal of the drainage catheter. Signed, Sterling Big, MD [facility] Electronically Signed   By: Malachy Moan M.D.   On: 02/02/2017 17:41   Dg Chest Port 1 View  Result Date: 02/19/2017 CLINICAL DATA:  Seizure-like activities.  Dyspnea. EXAM: PORTABLE CHEST 1 VIEW COMPARISON:  02/16/2017 CXR FINDINGS: Stable cardiomegaly with aortic atherosclerosis. Dialysis catheter tip terminates in the right atrium. ICD device projects over the left hemithorax with lead in the expected location of the right ventricle. Interval removal of left IJ central line. No overt pulmonary edema, pulmonary consolidation or pneumothorax. 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 No acute osseous abnormality. IMPRESSION: Stable cardiomegaly with aortic atherosclerosis. No acute pneumonic consolidation or CHF. Electronically Signed   By: Tollie Eth M.D.   On: 02/19/2017 17:43   Dg Chest Portable 1 View  Result Date: 02/16/2017 CLINICAL DATA:  Central line placement.  Initial encounter. EXAM: PORTABLE CHEST 1 VIEW COMPARISON:  Chest radiograph performed earlier today at 4:02 p.m. FINDINGS: The lungs are well-aerated. Mild vascular congestion is noted. Increased interstitial markings may reflect mild interstitial edema. There is no evidence of pleural effusion or pneumothorax. The cardiomediastinal silhouette is enlarged. A right-sided dual-lumen catheter is noted ending at the right atrium. An AICD is noted overlying the left  chest wall, with a lead ending overlying the right ventricle. No acute osseous abnormalities are seen. A left IJ line is noted ending about the distal SVC. IMPRESSION: 1. Left IJ line noted ending about the distal SVC. 2. Mild vascular congestion and cardiomegaly noted. Increased interstitial markings raise concern for mild interstitial edema. Electronically Signed   By: Roanna Raider M.D.   On: 02/16/2017 23:52   Dg Abd Portable 1v  Result Date: 02/20/2017 CLINICAL DATA:  Abdominal pain. EXAM: PORTABLE ABDOMEN - 1 VIEW COMPARISON:  CT 3 days prior FINDINGS: Right upper quadrant cholecystostomy tube remains in place. No evidence of free air on supine views. Mild gaseous gastric distention. No small bowel dilatation to suggest obstruction. Small volume of colonic stool. No radiopaque calculi. Cardiomegaly and pacemaker, partially included. IMPRESSION: No evidence of bowel obstruction. Mild-is gastric distension, nonspecific. Cholecystostomy tube in place. Electronically Signed   By: Rubye Oaks M.D.   On: 02/20/2017 00:26    Time Spent in minutes  34   Eddie North M.D on 02/20/2017 at 12:48 PM  Between 7am to 7pm - Pager - [phone]  After 7pm go to [email or web address] - password [facility]  [facility] -  Office  [phone]

## 2017-02-20 NOTE — Progress Notes (Signed)
Patient's BP 83/44 HR 71,rechecked manual BP 80/40. Patient asymptomatic. Text paged MD on call. Order received to give 250 cc bolus NS. Will continue to monitor. Jannis Atkins, Drinda Butts, Charity fundraiser

## 2017-02-20 NOTE — Progress Notes (Signed)
Subjective: Resting comfortable in Dialysis with no further seizures.   Exam: Vitals:   02/20/17 1130 02/20/17 1200  BP: (!) 110/52 (!) 109/52  Pulse: 73 73  Resp: 19 20  Temp:      HEENT-  Normocephalic, no lesions, without obvious abnormality.  Normal external eye and conjunctiva.  Normal TM's bilaterally.  Normal auditory canals and external ears. Normal external nose, mucus membranes and septum.  Normal pharynx.    Neuro:  CN: Pupils are equal and round. They are symmetrically reactive from 3-->2 mm. EOMI without nystagmus. Facial sensation is intact to light touch. Face is symmetric at rest with normal strength and mobility. Hearing is intact to conversational voice. Palate elevates symmetrically and uvula is midline. Voice is normal in tone, pitch and quality. Bilateral SCM and trapezii are 5/5. Tongue is midline with normal bulk and mobility.  Motor: Normal bulk, tone, and strength. 4/5 throughout. No drift.  Sensation: Intact to light touch.  DTRs: 1+, symmetric  Toes downgoing bilaterally. No pathologic reflexes.  Coordination: Finger-to-nose     Pertinent Labs/Diagnostics: EEG pending K 3.2 Cr 4.63 Ca 8.5 EGFR 10  Etta Quill PA-C [facility] [phone]  Impression: Possible new onset seizure versus hypoperfusion convulsive syncope. The occurrence with  Pulling of her port would suggest convulsive syncope rather than seizure.   Recommendations: 1) overnight EEG has already been ordered and is currently going. 2) neurology will continue to follow   Roland Rack, MD [facility] [phone]  If 7pm- 7am, please page neurology on call as listed in [place].  02/20/2017, 12:37 PM

## 2017-02-20 NOTE — Procedures (Signed)
Tol HD. Some hypotension last PM.  Hgb stable , K 3.2 Will use higher K bath. Some ? Of seizure yesterday. Neg imaging.  Neuro planning EEG but usually need to wait a day after dialysis due to nonspecific findings rel to HD. Brandy Dudley

## 2017-02-20 NOTE — Significant Event (Signed)
Rapid Response Event Note  Overview:  Called to see patient with onset AMS  Event Type: Neurologic  Initial Focused Assessment:  On arrival patient awake and alert - moves all 4's - screaming but unable to state what is wrong - she has been incontinent -- frightened look - not following commands -  VSS - staff report she is normally alert and oriented.  CBG WNL.  Bil BS present. No true SOB - only agitation.  No hx of seizure noted.    Interventions:  Attempted 12 lead - unable to keep patient still - placed on tele - abd soft - chole drain right side intact - abd soft - nondistended - green drainage - staff reports she had TLC removed left neck today and since has had "jerking" movements - no real seizure activity appreciated - none noted now - ? Post ictal state - incontinent during event.  IV placed by IV team.  Left jugular site dressing replaced.  Dr. Cameron Ali at bedside.  After 20 minutes patient suddenly became calm - oriented and co-op - does not remember event.  Handoff to [employer].  To call as needed.  Dr. Cameron Ali updated.    Plan of Care (if not transferred):  Event Summary:  Dr Cameron Ali notified PTA RRT    at   Outcome: Stayed in room and stabalized  Event End Time:  (1830)  [place]

## 2017-02-20 NOTE — Progress Notes (Signed)
Shift event note:  Notified by RN regarding possible seizure activity. RN reported jerking type movements after which pt appeared to be unresponsive. RR RN was paged and responded to bedside. During RR RN assessment pt noted to become more responsive and oriented. Per RN/RR RN assessment VS remained stable and pt remained in room. Upon my bedside assessment pt is awake but very restless in bed. She will answer questions at times and follow simple commands at times but not always. Though remains somewhat confused. Pt [name] x 4, and there is no appreciable facial droop and no airway compromise. Current VVS w/ 02 sats of 100% on r/a. Record indicates similar episode this afternoon at which time she was evaluated by RR RN and my colleague Dr Gonzella Lex. A ct head w/o cm was obtained (as well as an EKG and abd xray d/t a previous c/o (L) sided abd pain) at that time and was neg for acute findings. MRI contraindicated given AICD.  Assessment/Plan: 1. New onset seizure: Etiology unclear. Resolved at present. Pt appears to be close to baseline. Discussed pt w/ Dr Otelia Limes with neurology service. He has recommended 24 hour EEG and  has agreed to see pt in consultation. Appreciate neurology input. Will continue to monitor closely on med-surg.  Leanne Chang, NP-C [facility] Pager [phone]

## 2017-02-20 NOTE — Progress Notes (Signed)
LTM EEG started. Data port in the patients room is not working. IT called and an urgent ticket was placed to get the port working. Dr Amada Jupiter aware.

## 2017-02-21 LAB — C DIFFICILE QUICK SCREEN W PCR REFLEX
C Diff antigen: POSITIVE — AB
C Diff toxin: NEGATIVE

## 2017-02-21 LAB — CLOSTRIDIUM DIFFICILE BY PCR: CDIFFPCR: POSITIVE — AB

## 2017-02-21 LAB — CULTURE, BLOOD (ROUTINE X 2): Culture: NO GROWTH

## 2017-02-21 NOTE — Progress Notes (Signed)
Subjective: No further events, EEG compleete, but read pending.   Exam: Vitals:   02/20/17 2012 02/21/17 0612  BP: (!) 93/39 (!) 100/44  Pulse: 72 79  Resp: 16 17  Temp: 97.9 F (36.6 C) 98.5 F (36.9 C)   Gen: In bed, NAD Resp: non-labored breathing, no acute distress  Neuro: MS: awake, alert, oriented CN: face symmetric Motor: no drift in arms Sensory:intact to LT  Pertinent Labs: Ammonia, BUN normal.   Impression: 66 yo F with transient episodes of bilateral jerking. Convulsive syncope could be possible, though she reports preserved consciousness. Myoclonus would also be a possibility. With resolution of these spells, and no previous history of similar, I would not start an anti-epileptic at this time. No further workup unless spells recur. No AEDs unkless EEG with evidence of seizure predisposition.   Recommendations: 1) No further workup or treatment unless EEG shows seizure predisposition.  2) If EEG without epileptiform activity, neurology will sign off.  Ritta Slot, MD [facility] [phone]  If 7pm- 7am, please page neurology on call as listed in AMION.

## 2017-02-21 NOTE — Procedures (Signed)
Electroencephalogram report- LTM  Ordering Physician : Dr. Amada Jupiter EEG number:[id number]    Beginning date or time:  02/20/2017 3:22PM Ending date or time: 02/21/2017 8:30AM  Day of study: day  1  Medications include: Per EMR  MENTAL STATUS (per technician's notes): Alert. Oriented.  HISTORY: This 24 hours of intensive EEG monitoring with simultaneous video monitoring was performed for this patient with episodic loss of awareness with limb shaking.  The EEG is being done to further characterize these episodes to guide medical management.  TECHNICAL DESCRIPTION:  The study consists of a continuous 16-channel multi-montage digital video EEG recording with twenty-one electrodes placed according to the International 10-20 System. Additional leads included eye leads, true temporal leads (T1, T2), and an EKG lead. Activation procedures were not done.  REPORT: The background activity in this tracing consisted of 8-9 Hz, posterior rhythm. The activity is symmetric bilaterally and reactive to stimulation.  Drowsiness and sleep were manifested by background fragmentation, vertex waves and sleep spindles.  No focal slowing or epileptiform activity was seen.   There were no pushbutton events during the recording.  IMPRESSION: This EEG shows: No focal or paroxysmal activity  CLINICAL CORRELATION:  The nature of patient's events is unclear as none were recorded during the monitoring.

## 2017-02-21 NOTE — Progress Notes (Signed)
PROGRESS NOTE                                                                                                                                                                                                             Patient Demographics:    Brandy Dudley, is a 66 y.o. female, DOB - Sep 11, 1951, OZH:[id number]  Admit date - 02/16/2017   Admitting Physician Leslye Peer, MD  Outpatient Primary MD for the patient is [name], MD  LOS - 4  Outpatient Specialists: renal  Chief Complaint  Patient presents with  . Hypotension       Brief Narrative  66 year old female with history of ESRD on hemodialysis (TTS), CAD status post PCI, ischemic cardiomyopathy with EF of 30% status post AICD, diabetes mellitus on insulin, DVT on anticoagulation, recent hospitalization for acute cholecystitis with cholecystostomy tube placed by IR who became hypotensive during dialysis. She also complained of left upper quadrant abdominal pain. On presentation she was hypertensive and given 500 mL normal saline bolus and admitted to ICU. Lactic acid was normal while pro calcitonin was high. Patient placed on empiric antibiotics and monitored in ICU. Required pressor support with norepinephrine which was weaned off. Patient was transferred to hospitalist service and 60/25. Remained stable and was plan to be discharged back to SNF on 3/26 however developed jerky movements and acutely became confused. Discharge was held and was being monitored overnight but again had witnessed seizure-like activity during the evening of 3/26.   Subjective:   Pt has no new complaints. Neurology does not think that patient had seizures   Assessment  & Plan :   Principle problem Hypovolemic versus septic shock. Resolved. Blood Cultures negative. Has chronic low blood pressure.  - no leukocytosis or sign of infection and patient afebrile as such septic shock less  likely  ? Acute seizures - Neurology on board and not suspecting seizures   Coronary artery disease with history of CHF status post AICD.  Reports left-sided chest pain which is musculoskeletal and reproducible on pressure. Continue statin and beta blocker. Received Lidoderm patch briefly. Continue home pain medications.  Diabetes mellitus type 2 CBG well controlled. Resume home dose Lantus and sliding scale coverage.  A Fib Rate controlled. Continue amiodarone.  ESRD on HD (TTS) Renal following. Tolerated dialysis on 3/24.  History of remote DVT  No longer on anticoagulant.  History of COPD Resume Dulera and Spiriva.  Recent cholecystitis status post cholecystostomy tube. Cholecystostomy tube appears to function well with good drainage. Follow with IR as scheduled.   Consults: PCCM Nephrology  Procedures:    head CT EEG  Family communication: None at bedside  Disposition: Return to SNF possibly in the next 24    Lab Results  Component Value Date   PLT 228 02/20/2017    Antibiotics  :    Anti-infectives    Start     Dose/Rate Route Frequency Ordered Stop   02/21/17 1200  levofloxacin (LEVAQUIN) IVPB 500 mg     500 mg 100 mL/hr over 60 Minutes Intravenous Every 48 hours 02/19/17 1020 02/21/17 1355   02/21/17 0000  levofloxacin (LEVAQUIN) 500 MG tablet     500 mg Oral  Once 02/19/17 1046 02/21/17 2359   02/20/17 1200  vancomycin (VANCOCIN) IVPB 750 mg/150 ml premix  Status:  Discontinued     750 mg 150 mL/hr over 60 Minutes Intravenous Every T-Th-Sa (Hemodialysis) 02/18/17 0927 02/19/17 1001   02/19/17 1200  levofloxacin (LEVAQUIN) IVPB 750 mg     750 mg 100 mL/hr over 90 Minutes Intravenous  Once 02/19/17 1020 02/19/17 1328   02/18/17 1000  vancomycin (VANCOCIN) IVPB 750 mg/150 ml premix     750 mg 150 mL/hr over 60 Minutes Intravenous  Once 02/18/17 0927 02/18/17 1134   02/16/17 2000  vancomycin (VANCOCIN) 1,500 mg in sodium chloride 0.9 %  500 mL IVPB     1,500 mg 250 mL/hr over 120 Minutes Intravenous  Once 02/16/17 1945 02/17/17 0300   02/16/17 1600  piperacillin-tazobactam (ZOSYN) IVPB 3.375 g  Status:  Discontinued     3.375 g 12.5 mL/hr over 240 Minutes Intravenous Every 12 hours 02/16/17 1538 02/19/17 1001        Objective:   Vitals:   02/20/17 1800 02/20/17 2012 02/21/17 0612 02/21/17 1003  BP: (!) 98/55 (!) 93/39 (!) 100/44 (!) 104/51  Pulse: 80 72 79 79  Resp: 20 16 17 17   Temp: 98 F (36.7 C) 97.9 F (36.6 C) 98.5 F (36.9 C) 98.6 F (37 C)  TempSrc: Oral Oral Oral Oral  SpO2: 100% 100% 98% 100%  Weight:      Height:        Wt Readings from Last 3 Encounters:  02/20/17 61.9 kg (136 lb 7.4 oz)  02/16/17 74.4 kg (164 lb)  02/15/17 74.4 kg (164 lb)     Intake/Output Summary (Last 24 hours) at 02/21/17 1526 Last data filed at 02/21/17 1437  Gross per 24 hour  Intake              600 ml  Output                3 ml  Net              597 ml     Physical Exam General: not in distress, awake and alert HEENT: Moist mucosa, supple neck Cardiovascular: Normal S1 and S2, no murmurs rub or gallop Chest: Clear to auscultation bilaterally, reproducible pain underneath the left breast, no rash GI: Soft, NT, ND, bowel sounds +, cholecystostomy tube draining biliary fluid Musculoskeletal: Warm, no edema    Data Review:    CBC  Recent Labs Lab 02/16/17 1644 02/17/17 0356 02/18/17 0415 02/20/17 0845  WBC 9.4 11.0* 8.5 7.7  HGB 8.6* 9.2* 7.4* 8.1*  HCT 29.7* 31.1* 25.7* 27.8*  PLT 264 291 204 228  MCV 82.3 81.6 82.1 81.8  MCH 23.8* 24.1* 23.6* 23.8*  MCHC 29.0* 29.6* 28.8* 29.1*  RDW 17.0* 17.1* 17.2* 17.2*  LYMPHSABS 1.1 1.2  --   --   MONOABS 1.1* 0.6  --   --   EOSABS 0.0 0.1  --   --   BASOSABS 0.0 0.0  --   --     Chemistries   Recent Labs Lab 02/16/17 1644 02/17/17 0356 02/18/17 0415 02/19/17 0528 02/20/17 0845  NA 135 133* 138 137 140  K 4.1 3.7 3.5 3.3* 3.2*  CL 91*  91* 99* 98* 101  CO2 24 24 29 27 26   GLUCOSE 76 157* 84 84 74  BUN 21* 25* 9 15 19   CREATININE 3.91* 4.31* 2.46* 3.62* 4.63*  CALCIUM 8.0* 8.1* 8.0* 8.1* 8.5*  MG  --  2.0 1.9 1.8  --   AST 16  --   --   --   --   ALT 7*  --   --   --   --   ALKPHOS 141*  --   --   --   --   BILITOT 1.2  --   --   --   --    ------------------------------------------------------------------------------------------------------------------ No results for input(s): CHOL, HDL, LDLCALC, TRIG, CHOLHDL, LDLDIRECT in the last 72 hours.  Lab Results  Component Value Date   HGBA1C 5 12/25/2016   ------------------------------------------------------------------------------------------------------------------ No results for input(s): TSH, T4TOTAL, T3FREE, THYROIDAB in the last 72 hours.  Invalid input(s): FREET3 ------------------------------------------------------------------------------------------------------------------ No results for input(s): VITAMINB12, FOLATE, FERRITIN, TIBC, IRON, RETICCTPCT in the last 72 hours.  Coagulation profile No results for input(s): INR, PROTIME in the last 168 hours.  No results for input(s): DDIMER in the last 72 hours.  Cardiac Enzymes No results for input(s): CKMB, TROPONINI, MYOGLOBIN in the last 168 hours.  Invalid input(s): CK ------------------------------------------------------------------------------------------------------------------    Component Value Date/Time   BNP 2,422.0 (H) 02/16/2017 1644    Inpatient Medications  Scheduled Meds: . amiodarone  200 mg Oral Daily  . darbepoetin (ARANESP) injection - DIALYSIS  200 mcg Intravenous Q Sat-HD  . feeding supplement (NEPRO CARB STEADY)  237 mL Oral BID BM  . ferric gluconate (FERRLECIT/NULECIT) IV  125 mg Intravenous Q T,Th,Sa-HD  . heparin subcutaneous  5,000 Units Subcutaneous Q8H  . lidocaine  1 patch Transdermal Q24H  . mometasone-formoterol  2 puff Inhalation BID  . multivitamin  1 tablet  Oral QHS  . promethazine  12.5 mg Oral Once  . tiotropium  18 mcg Inhalation Daily   Continuous Infusions: PRN Meds:.sodium chloride, sodium chloride, sodium chloride, acetaminophen, alteplase, chlorproMAZINE, heparin, heparin, lidocaine (PF), lidocaine-prilocaine, LORazepam, pentafluoroprop-tetrafluoroeth  Micro Results Recent Results (from the past 240 hour(s))  Blood Culture (routine x 2)     Status: None   Collection Time: 02/16/17  4:44 PM  Result Value Ref Range Status   Specimen Description BLOOD LEFT HAND  Final   Special Requests AEROBIC BOTTLE ONLY 5CC  Final   Culture NO GROWTH 5 DAYS  Final   Report Status 02/21/2017 FINAL  Final  C difficile quick scan w PCR reflex     Status: Abnormal   Collection Time: 02/21/17  7:49 AM  Result Value Ref Range Status   C Diff antigen POSITIVE (A) NEGATIVE Final   C Diff toxin NEGATIVE NEGATIVE Final   C Diff interpretation Results are indeterminate. See PCR results.  Final  Clostridium Difficile by PCR  Status: Abnormal   Collection Time: 02/21/17  7:49 AM  Result Value Ref Range Status   Toxigenic C Difficile by pcr POSITIVE (A) NEGATIVE Final    Comment: Positive for toxigenic C. difficile with little to no toxin production. Only treat if clinical presentation suggests symptomatic illness.    Radiology Reports Dg Chest 2 View  Result Date: 02/16/2017 CLINICAL DATA:  Confusion and hypotension EXAM: CHEST  2 VIEW COMPARISON:  09/16/2016 FINDINGS: Dialysis catheter is noted in the right jugular vein in satisfactory position. This is new from the prior exam. A defibrillator is again seen and stable. Cardiomegaly is again noted. Mild vascular congestion is seen likely of a chronic nature. No focal infiltrate or sizable effusion is seen. IMPRESSION: Mild vascular congestion which may be related to volume overload. No other focal abnormality is seen. Electronically Signed   By: Alcide Clever M.D.   On: 02/16/2017 16:13   Ct Head Wo  Contrast  Result Date: 02/19/2017 CLINICAL DATA:  Acute encephalopathy EXAM: CT HEAD WITHOUT CONTRAST TECHNIQUE: Contiguous axial images were obtained from the base of the skull through the vertex without intravenous contrast. COMPARISON:  None. FINDINGS: Brain: Age-appropriate involutional changes. No acute large vascular territory infarction, hemorrhage nor midline shift. No intra-axial mass nor extra-axial fluid. Minimal small vessel ischemic changes of periventricular white matter. No effacement of the basal cisterns. Fourth ventricle is midline. There is no hydrocephalus. Vascular: Right vertebral and bilateral carotid siphon calcifications. No unexpected calcifications are hyperdense vessels. Skull: Normal. Negative for fracture or focal lesion. Sinuses/Orbits: Trace fluid in the sphenoid sinus. Mild membrane thickening of the ethmoid sinus. Other: Mastoids are clear. IMPRESSION: Chronic appearing small vessel ischemic disease of periventricular white matter. No acute intracranial abnormality. Trace sphenoid sinus fluid. Electronically Signed   By: Tollie Eth M.D.   On: 02/19/2017 19:29   Ct Abdomen Pelvis W Contrast  Result Date: 02/16/2017 CLINICAL DATA:  66 year old female with multiple medical comorbidities status post transhepatic percutaneous cholecystostomy tube on 02/02/2017. Abdominal pain and hypotension. Initial encounter. EXAM: CT ABDOMEN AND PELVIS WITH CONTRAST TECHNIQUE: Multidetector CT imaging of the abdomen and pelvis was performed using the standard protocol following bolus administration of intravenous contrast. CONTRAST:  ISOVUE-300 IOPAMIDOL (ISOVUE-300) INJECTION 61%, however, it appears the vast majority of a contrast dose extravasated into the left upper extremity as seen on coronal image 80. COMPARISON:  CT Abdomen and Pelvis 02/01/2017, and earlier. FINDINGS: Essentially this is a noncontrast exam may in light of the IV extravasation into the left upper extremity. Lower  chest: Left lower chest wall cardiac AICD type device with streak artifact. Severe cardiomegaly. No pericardial effusion. Continued medial left lower lobe atelectasis or consolidation with superimposed calcified granuloma. Interval improved ventilation at the right lung base with residual peribronchial atelectasis. Virtually resolved bilateral small pleural effusions. Hepatobiliary: Transhepatic percutaneous cholecystostomy tube in place with decompressed gallbladder. No biliary ductal enlargement. No discrete liver lesion. Pancreas: Negative. Spleen: Negative. Adrenals/Urinary Tract: Mild bilateral adrenal gland thickening compatible with hyperplasia stable. The kidneys appears stable without hydronephrosis or perinephric stranding. Diminutive, unremarkable urinary bladder. No abdominal free fluid. Stomach/Bowel: Decreased stool ball in the rectum but increased circumferential rectal wall thickening, up to 19 mm (series 21, image 69). Similar retained stool in the sigmoid colon and left colon. Similar retained stool in the transverse colon and right colon. Negative appendix (series 21, image 61). No dilated small bowel. Negative stomach and duodenum. Vascular/Lymphatic: Extensive Aortoiliac calcified atherosclerosis. Partially visible left femoral region  dialysis graft. Poor vascular contrast bolus. Vascular patency not evaluated. Reproductive: Negative. Other: No pelvic free fluid. Rectus muscle diastases with small developing fat and small bowel containing ventral abdominal hernia, non incarcerated. See sagittal image 79. Musculoskeletal: Chronic severe rose to changes throughout the visible lower thoracic spine to the T12 level, not new but progressed since September 2017. However, these bony changes are new since June of 2017. The superior L1 vertebral body now is being eroded, was intact in September. The erosive changes are accompanied by partially visible pedicle fractures at T7. The visible lower ribs  remain intact. Severe lower lumbar facet arthropathy. Pelvis and proximal femurs intact. IMPRESSION: 1. Extravasation of the contrast for this study into the patient left upper extremity near the antecubital fossa. Recommend physical exam evaluation of the IV site, elevation of the extremity, and ice application. Recommend surveillance for progressive skin changes, which if detected should prompt plastic surgery consultation. 2. Progressively abnormal appearance of the visualized thoracic spine with unusual vertebral body erosion which has developed since June of last year. New erosion of the superior T1 vertebral body since September. A pathologic fracture of T7 is suspected but incompletely visualized. 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 3. Right transhepatic percutaneous cholecystostomy tube with no adverse features. 4. Decreased rectal fecal impaction but circumferential rectal wall thickening suggestive of proctitis. However, no other bowel inflammation. No bowel obstruction. 5. Resolved small bilateral pleural effusions since earlier this month. Residual lower lobe atelectasis. 6. Severe cardiomegaly. Extensive calcified aortoiliac atherosclerosis. 7. Salient findings on this exam were discussed by telephone with Dr. Drema Pry on 02/16/2017 at 1930 hours. Electronically Signed   By: Odessa Fleming M.D.   On: 02/16/2017 19:34   Ct Abdomen Pelvis W Contrast  Result Date: 02/01/2017 CLINICAL DATA:  Initial evaluation for right-sided abdominal pain for 1 month. EXAM: CT ABDOMEN AND PELVIS WITH CONTRAST TECHNIQUE: Multidetector CT imaging of the abdomen and pelvis was performed using the standard protocol following bolus administration of intravenous contrast. CONTRAST:  ISOVUE-300 IOPAMIDOL (ISOVUE-300) INJECTION 61% COMPARISON:  Prior CT from 08/26/2016. FINDINGS: Lower chest: Small layering bilateral pleural effusions  with associated bibasilar atelectasis. Cardiomegaly with pacemaker electrodes partially visualized. Hepatobiliary: Few scattered subcentimeter hypodensities noted within the liver, too small the characterize, but may reflect small cyst. Liver otherwise unremarkable. Mucosal edema with mild fuzzy stranding seen about the gallbladder, which may reflect sequelae of acute cholecystitis. Question trace free pericholecystic fluid. No definite internal stones identified. No appreciable biliary dilatation. Pancreas: Pancreas within normal limits. Spleen: Spleen within normal limits. Adrenals/Urinary Tract: Adrenal glands are normal. Kidneys equal in size with symmetric enhancement. Few scattered subcentimeter hypodensities noted within the right kidney, too small the characterize, but statistically likely reflects small cyst. Kidneys otherwise unremarkable without nephrolithiasis, hydronephrosis, or focal enhancing renal mass. No hydroureter. Bladder largely decompressed. Circumferential bladder wall thickening likely related incomplete distension. Stomach/Bowel: Stomach within normal limits. No evidence for bowel obstruction. Appendix well visualized within the right lower quadrant and is within normal limits without associated inflammatory changes to suggest acute appendicitis. No acute inflammatory changes seen about the bowels. Large amount of impacted stool within the rectal vault, suggesting constipation, similar to previous. Vascular/Lymphatic: Moderate aorto bi-iliac atherosclerotic disease. No aneurysm. No pathologically enlarged intra-abdominal or pelvic lymph nodes. Reproductive: Small amount of fluid present within the endometrial canal. Uterus otherwise unremarkable. Ovaries within normal limits. Other: No free air or fluid. Fat  containing paraumbilical hernia noted. With without closely approximated at the base of the hernia without associated inflammation or obstruction. Musculoskeletal: Mild diffuse anasarca  noted, likely related to overall volume status. Progressive height loss with endplate irregularity within the visualized vertebral bodies of the thoracic spine, suspected to be related to dialysis/renal osteodystrophy given patient history. No definite acute osseous abnormality. Bilateral facet arthrosis noted within the lower lumbar spine. No other worrisome lytic or blastic osseous lesions. IMPRESSION: 1. Mild mucosal enhancement with small volume free pericholecystic fluid and subtle hazy stranding about the gallbladder. Findings may reflect sequelae of acute cholecystitis. Correlation were laboratory values recommended. Additionally, this could be further assessed with dedicated right upper quadrant ultrasound as indicated. 2. Large amount of impacted stool within the rectal vault, similar to previous. 3. Cardiomegaly with bilateral pleural effusions and diffuse anasarca. Associated bibasilar atelectasis within the lung bases. 4. 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 Electronically Signed   By: Rise Mu M.D.   On: 02/01/2017 23:28   Ir Perc Cholecystostomy  Result Date: 02/02/2017 INDICATION: 66 year old female with numerous medical problems including congestive heart failure, COPD, and end-stage renal disease. She has acute cholecystitis and is not considered a surgical candidate. She presents for placement of a percutaneous cholecystostomy tube. EXAM: CHOLECYSTOSTOMY MEDICATIONS: Patient recently received intravenous Zosyn. No additional antibiotic prophylaxis was administered. ANESTHESIA/SEDATION: Moderate (conscious) sedation was employed during this procedure. A total of Versed 1 mg and Fentanyl 100 mcg was administered intravenously. Moderate Sedation Time: 10 minutes. The patient's level of consciousness and vital signs were monitored continuously by radiology  nursing throughout the procedure under my direct supervision. FLUOROSCOPY TIME:  Fluoroscopy Time: 0 minutes 54 seconds (3 mGy). COMPLICATIONS: None immediate. PROCEDURE: Informed written consent was obtained from the patient after a thorough discussion of the procedural risks, benefits and alternatives. All questions were addressed. Maximal Sterile Barrier Technique was utilized including caps, mask, sterile gowns, sterile gloves, sterile drape, hand hygiene and skin antiseptic. A timeout was performed prior to the initiation of the procedure. The right upper quadrant was interrogated with ultrasound. The distended gallbladder was successfully identified. The colon was also identified and no was made of its location. Local anesthesia was attained by infiltration with 1% lidocaine. A small dermatotomy was made. Under real-time sonographic guidance, the gallbladder lumen was punctured with a 21 gauge micropuncture needle along a short transhepatic course. Care was taken to avoid the nearby hepatic flexure of the colon. There was return of dark brown cloudy bile through the needle hub. A gentle hand injection of contrast material confirmed opacification of the gallbladder lumen. A 0.018 wire was advanced through the needle and coiled in the gallbladder lumen. The needle was exchanged for the Accustick sheath. The micro wire was then exchanged for a short Amplatz wire. The skin tract was then dilated to 10 [place] and a Cook 10.2 [place] all-purpose drainage catheter was advanced over the wire and formed within the gallbladder. A gentle hand injection of contrast material confirms the tube is located within the gallbladder. The cystic duct is occluded. No definite filling defect to suggest acute cholecystitis. The cholecystostomy tube was connected to gravity bag drainage and secured to the skin with 0 Prolene suture. IMPRESSION: 1. Successful placement of a transhepatic percutaneous cholecystostomy tube for acute  cholecystitis. No definite gallstones are visualized. PLAN: 1. Maintain drain to gravity drainage and flush daily. 2. Follow-up  in interventional radiology drain clinic in 4 weeks. 3. Given the patient's medical history, she is unlikely to ever become a surgical candidate. If her cystic duct regains patency in the future we may be able to work toward removal of the drainage catheter. Signed, Sterling Big, MD [facility] Electronically Signed   By: Malachy Moan M.D.   On: 02/02/2017 17:41   Dg Chest Port 1 View  Result Date: 02/19/2017 CLINICAL DATA:  Seizure-like activities.  Dyspnea. EXAM: PORTABLE CHEST 1 VIEW COMPARISON:  02/16/2017 CXR FINDINGS: Stable cardiomegaly with aortic atherosclerosis. Dialysis catheter tip terminates in the right atrium. ICD device projects over the left hemithorax with lead in the expected location of the right ventricle. Interval removal of left IJ central line. No overt pulmonary edema, pulmonary consolidation or pneumothorax. 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 No acute osseous abnormality. IMPRESSION: Stable cardiomegaly with aortic atherosclerosis. No acute pneumonic consolidation or CHF. Electronically Signed   By: Tollie Eth M.D.   On: 02/19/2017 17:43   Dg Chest Portable 1 View  Result Date: 02/16/2017 CLINICAL DATA:  Central line placement.  Initial encounter. EXAM: PORTABLE CHEST 1 VIEW COMPARISON:  Chest radiograph performed earlier today at 4:02 p.m. FINDINGS: The lungs are well-aerated. Mild vascular congestion is noted. Increased interstitial markings may reflect mild interstitial edema. There is no evidence of pleural effusion or pneumothorax. The cardiomediastinal silhouette is enlarged. A right-sided dual-lumen catheter is noted ending at the right atrium. An AICD is noted overlying the left chest wall, with a lead ending overlying the right ventricle. No acute osseous  abnormalities are seen. A left IJ line is noted ending about the distal SVC. IMPRESSION: 1. Left IJ line noted ending about the distal SVC. 2. Mild vascular congestion and cardiomegaly noted. Increased interstitial markings raise concern for mild interstitial edema. Electronically Signed   By: Roanna Raider M.D.   On: 02/16/2017 23:52   Dg Abd Portable 1v  Result Date: 02/20/2017 CLINICAL DATA:  Abdominal pain. EXAM: PORTABLE ABDOMEN - 1 VIEW COMPARISON:  CT 3 days prior FINDINGS: Right upper quadrant cholecystostomy tube remains in place. No evidence of free air on supine views. Mild gaseous gastric distention. No small bowel dilatation to suggest obstruction. Small volume of colonic stool. No radiopaque calculi. Cardiomegaly and pacemaker, partially included. IMPRESSION: No evidence of bowel obstruction. Mild-is gastric distension, nonspecific. Cholecystostomy tube in place. Electronically Signed   By: Rubye Oaks M.D.   On: 02/20/2017 00:26    Time Spent in minutes  35   Penny Pia M.D on 02/21/2017 at 3:26 PM  Between 7am to 7pm - Pager - [phone]  After 7pm go to [email or web address] - password [facility]  [facility] -  Office  [phone]

## 2017-02-21 NOTE — Care Management Note (Signed)
Case Management Note  Patient Details  Name: Brandy Dudley MRN: [id number] Date of Birth: Feb 06, 1951  Subjective/Objective:      CM following for progression and d/c planning.               Action/Plan: 02/21/2017 Pt is resident of [facility] and plans to return to that facility. No CM needs.   Expected Discharge Date:  02/22/2017              Expected Discharge Plan:  Skilled Nursing Facility  In-House Referral:  Clinical Social Work  Discharge planning Services  NA  Post Acute Care Choice:  NA Choice offered to:  NA  DME Arranged:   NA DME Agency:   NA  HH Arranged:   NA HH Agency:   NA  Status of Service:  Completed, signed off  If discussed at [employer] of Stay Meetings, dates discussed:    Additional Comments:  Starlyn Skeans, RN 02/21/2017, 12:19 PM

## 2017-02-21 NOTE — Progress Notes (Signed)
Pharmacy Antibiotic Note  Brandy Dudley is a 66 y.o. female admitted on 02/16/2017 with intra-abdominal infection.  Patient has ESRD on HD TTSat- last HD session 3/27. Switched to Levaquin to complete a 7 day course of antibiotics.   Plan: Levaquin 500mg  IV q48h- last dose due today 3/28 Pharmacy to sign off as no further doses required  Height: 5\' 4"  (162.6 cm) Weight: 136 lb 7.4 oz (61.9 kg) IBW/kg (Calculated) : 54.7  Temp (24hrs), Avg:98.1 F (36.7 C), Min:97.5 F (36.4 C), Max:98.6 F (37 C)   Recent Labs Lab 02/16/17 1644 02/16/17 1657 02/16/17 2028 02/17/17 0356 02/18/17 0415 02/19/17 0528 02/20/17 0845  WBC 9.4  --   --  11.0* 8.5  --  7.7  CREATININE 3.91*  --   --  4.31* 2.46* 3.62* 4.63*  LATICACIDVEN  --  1.08 1.06  --   --   --   --     Estimated Creatinine Clearance: 10.3 mL/min (A) (by C-G formula based on SCr of 4.63 mg/dL (H)).    Allergies  Allergen Reactions  . Entresto [Sacubitril-Valsartan] Other (See Comments)     unknown reaction  . Latex Itching  . Sulfa Antibiotics Itching   Vancomycin 3/23>>3/26 Zosyn 3/23>>3/26 LVQ 3/26>>3/28   3/23 blood: ngtd 3/28 CDiff: antigen pos, toxin neg 3/28 CDiff PCR: sent  Lexa Coronado D. Marlen Mollica, PharmD, BCPS Clinical Pharmacist Pager: [phone] 02/21/2017 10:43 AM

## 2017-02-21 NOTE — Progress Notes (Signed)
Subjective:  No cos , tolerated HD yest. / No further seizure like activity /   Objective Vital signs in last 24 hours: Vitals:   02/20/17 1800 02/20/17 2012 02/21/17 0612 02/21/17 1003  BP: (!) 98/55 (!) 93/39 (!) 100/44 (!) 104/51  Pulse: 80 72 79 79  Resp: 20 16 17 17   Temp: 98 F (36.7 C) 97.9 F (36.6 C) 98.5 F (36.9 C) 98.6 F (37 C)  TempSrc: Oral Oral Oral Oral  SpO2: 100% 100% 98% 100%  Weight:      Height:       Weight change: -5.777 kg (-12 lb 11.8 oz)  Physical Exam: General: alert NAD Elderly AAF, calm , OX3 Heart: RRR no mur, Rub or gallop  Lungs: CTA bilat Abdomen: BS pos. R Quad drainage tube with drainage bag/ non distended, nontender Extremities: no pedal edema Dialysis Access: L fem  avgg Pos bruit / R IJ Perm cath    OP [facility] 400/800 67kg 2/2.25 P2 R IJ cath/ maturing L thigh AVG placed 02/12/17  Dr. Randie Heinz / Hep 6000 - mircera 225 last 3/8, Fe 50/wk, no vdra - labs: Hb9.8 tsat 19% , had course of Fe recently, ipth 158   Problem/Plan: 1. Abd pain and hypotension / recent perc chole tube for acute cholecysititis - on empiric abx for suspected sepsis.  To complete on 02/22/17 po Levaquin  2. Volume - is down 4-5kg under dry wt, no vol excess 3. ESRD TTS HD on schedule  Next hd ? [facility] in [facility]  Will contact center if dc home   4. ? 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 Anemia of CKD-  hgb 7.4 .8.1  redosed ESA 200 darbe on 3/24. Fu  hgb trend as op  7. Hx COPD 8. PAF with AICD - on amio/ Sr on exam today / was on MTP 25 XL at home not in [place].  9. DM - off insulin now  Lenny Pastel, PA-C [facility] Beeper [phone] 02/21/2017,11:05 AM  LOS: 4 days   Renal Attending: I agree with note above. Has freq stools with pos C. Diff. Harshan Kearley C  Labs: Basic Metabolic Panel:  Recent Labs Lab 02/17/17 0356 02/18/17 0415  02/19/17 0528 02/19/17 1544 02/20/17 0845  NA 133* 138 137  --  140  K 3.7 3.5 3.3*  --  3.2*  CL 91* 99* 98*  --  101  CO2 24 29 27   --  26  GLUCOSE 157* 84 84  --  74  BUN 25* 9 15  --  19  CREATININE 4.31* 2.46* 3.62*  --  4.63*  CALCIUM 8.1* 8.0* 8.1*  --  8.5*  PHOS 6.4*  --   --  3.0 3.4   Liver Function Tests:  Recent Labs Lab 02/16/17 1644 02/20/17 0845  AST 16  --   ALT 7*  --   ALKPHOS 141*  --   BILITOT 1.2  --   PROT 8.3*  --   ALBUMIN 1.6* 2.1*    Recent Labs Lab 02/16/17 1644  LIPASE <10*    Recent Labs Lab 02/19/17 1811  AMMONIA 31   CBC:  Recent Labs Lab 02/16/17 1644 02/17/17 0356 02/18/17 0415 02/20/17 0845  WBC 9.4 11.0* 8.5 7.7  NEUTROABS 7.1 9.0*  --   --   HGB 8.6* 9.2* 7.4* 8.1*  HCT 29.7* 31.1* 25.7* 27.8*  MCV 82.3 81.6 82.1 81.8  PLT 264 291 204 228   Cardiac Enzymes: No results for input(s): CKTOTAL, CKMB, CKMBINDEX, TROPONINI in the last 168 hours. CBG:  Recent Labs Lab 02/18/17 2018 02/19/17 0752 02/19/17 1526 02/19/17 1652 02/19/17 2050  GLUCAP 91 84 99 103* 92    Studies/Results: Ct Head Wo Contrast  Result Date: 02/19/2017 CLINICAL DATA:  Acute encephalopathy EXAM: CT HEAD WITHOUT CONTRAST TECHNIQUE: Contiguous axial images were obtained from the base of the skull through the vertex without intravenous contrast. COMPARISON:  None. FINDINGS: Brain: Age-appropriate involutional changes. No acute large vascular territory infarction, hemorrhage nor midline shift. No intra-axial mass nor extra-axial fluid. Minimal small vessel ischemic changes of periventricular white matter. No effacement of the basal cisterns. Fourth ventricle is midline. There is no hydrocephalus. Vascular: Right vertebral and bilateral carotid siphon calcifications. No unexpected calcifications are hyperdense vessels. Skull: Normal. Negative for fracture or focal lesion. Sinuses/Orbits: Trace fluid in the sphenoid sinus. Mild membrane thickening of  the ethmoid sinus. Other: Mastoids are clear. IMPRESSION: Chronic appearing small vessel ischemic disease of periventricular white matter. No acute intracranial abnormality. Trace sphenoid sinus fluid. Electronically Signed   By: Tollie Eth M.D.   On: 02/19/2017 19:29   Dg Chest Port 1 View  Result Date: 02/19/2017 CLINICAL DATA:  Seizure-like activities.  Dyspnea. EXAM: PORTABLE CHEST 1 VIEW COMPARISON:  02/16/2017 CXR FINDINGS: Stable cardiomegaly with aortic atherosclerosis. Dialysis catheter tip terminates in the right atrium. ICD device projects over the left hemithorax with lead in the expected location of the right ventricle. Interval removal of left IJ central line. No overt pulmonary edema, pulmonary consolidation or pneumothorax. 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 No acute osseous abnormality. IMPRESSION: Stable cardiomegaly with aortic atherosclerosis. No acute pneumonic consolidation or CHF. Electronically Signed   By: Tollie Eth M.D.   On: 02/19/2017 17:43   Dg Abd Portable 1v  Result Date: 02/20/2017 CLINICAL DATA:  Abdominal pain. EXAM: PORTABLE ABDOMEN - 1 VIEW COMPARISON:  CT 3 days prior FINDINGS: Right upper quadrant cholecystostomy tube remains in place. No evidence of free air on supine views. Mild gaseous gastric distention. No small bowel dilatation to suggest obstruction. Small volume of colonic stool. No radiopaque calculi. Cardiomegaly and pacemaker, partially included. IMPRESSION: No evidence of bowel obstruction. Mild-is gastric distension, nonspecific. Cholecystostomy tube in place. Electronically Signed   By: Rubye Oaks M.D.   On: 02/20/2017 00:26   Medications:  . amiodarone  200 mg Oral Daily  . darbepoetin (ARANESP) injection - DIALYSIS  200 mcg Intravenous Q Sat-HD  . feeding supplement (NEPRO CARB STEADY)  237 mL Oral BID BM  . ferric gluconate (FERRLECIT/NULECIT) IV  125 mg Intravenous Q T,Th,Sa-HD  . heparin subcutaneous  5,000 Units  Subcutaneous Q8H  . levofloxacin (LEVAQUIN) IV  500 mg Intravenous Q48H  . lidocaine  1 patch Transdermal Q24H  . mometasone-formoterol  2 puff Inhalation BID  . multivitamin  1 tablet Oral QHS  . promethazine  12.5 mg Oral Once  . tiotropium  18 mcg Inhalation Daily

## 2017-02-21 NOTE — Care Management Important Message (Signed)
Important Message  Patient Details  Name: Leland Leidel MRN: [id number] Date of Birth: October 17, 1951   Medicare Important Message Given:  Yes    Zarianna Dicarlo, Annamarie Major, RN 02/21/2017, 12:21 PM

## 2017-02-21 NOTE — Progress Notes (Signed)
Oxygen sat 84% on room air while sleeping, placed patient on 2L nasal cannula, sats increased to 97%

## 2017-02-21 NOTE — Progress Notes (Signed)
Patient has had 4 loose, mucoid stools this shift.  Patient is incontinent.

## 2017-02-22 DIAGNOSIS — R55 Syncope and collapse: Secondary | ICD-10-CM

## 2017-02-22 LAB — GLUCOSE, CAPILLARY: GLUCOSE-CAPILLARY: 107 mg/dL — AB (ref 65–99)

## 2017-02-22 LAB — CBC
HCT: 27.2 % — ABNORMAL LOW (ref 36.0–46.0)
Hemoglobin: 8 g/dL — ABNORMAL LOW (ref 12.0–15.0)
MCH: 24 pg — AB (ref 26.0–34.0)
MCHC: 29.4 g/dL — AB (ref 30.0–36.0)
MCV: 81.7 fL (ref 78.0–100.0)
PLATELETS: 175 10*3/uL (ref 150–400)
RBC: 3.33 MIL/uL — AB (ref 3.87–5.11)
RDW: 17.5 % — AB (ref 11.5–15.5)
WBC: 8.7 10*3/uL (ref 4.0–10.5)

## 2017-02-22 LAB — RENAL FUNCTION PANEL
Albumin: 1.9 g/dL — ABNORMAL LOW (ref 3.5–5.0)
Anion gap: 10 (ref 5–15)
BUN: 18 mg/dL (ref 6–20)
CALCIUM: 8.2 mg/dL — AB (ref 8.9–10.3)
CHLORIDE: 99 mmol/L — AB (ref 101–111)
CO2: 27 mmol/L (ref 22–32)
CREATININE: 4.3 mg/dL — AB (ref 0.44–1.00)
GFR calc non Af Amer: 10 mL/min — ABNORMAL LOW (ref >60)
GFR, EST AFRICAN AMERICAN: 11 mL/min — AB (ref >60)
Glucose, Bld: 111 mg/dL — ABNORMAL HIGH (ref 65–99)
Phosphorus: 1.6 mg/dL — ABNORMAL LOW (ref 2.5–4.6)
Potassium: 3.3 mmol/L — ABNORMAL LOW (ref 3.5–5.1)
SODIUM: 136 mmol/L (ref 135–145)

## 2017-02-22 MED ORDER — MIDODRINE HCL 5 MG PO TABS
5.0000 mg | ORAL_TABLET | Freq: Three times a day (TID) | ORAL | Status: DC
  Administered 2017-02-22 – 2017-02-23 (×5): 5 mg via ORAL
  Filled 2017-02-22 (×5): qty 1

## 2017-02-22 MED ORDER — VANCOMYCIN 50 MG/ML ORAL SOLUTION
125.0000 mg | Freq: Four times a day (QID) | ORAL | Status: DC
  Administered 2017-02-22 – 2017-02-23 (×7): 125 mg via ORAL
  Filled 2017-02-22 (×8): qty 2.5

## 2017-02-22 NOTE — Progress Notes (Signed)
Dialysis treatment terminated after 15 minutes d/t hypotensive episode involving loss of consciousness and seizure like activity.  Blood returned and NS bolus given, Nephrology aware.    0 mL ultrafiltrated.  -850 mL net fluid removal.  Patient A & O X 4. Lung sounds diminished to ausculation in all fields. Generalized edema. Cardiac: NSR.  Cleansed RIJ catheter with chlorhexidine.  Disconnected lines and flushed ports with saline per protocol.  Ports locked with heparin and capped per protocol.    Report given to bedside, RN Vonna Kotyk.

## 2017-02-22 NOTE — Progress Notes (Signed)
Subjective:  "I have C. Diff but not bad case", for hd today   Objective Vital signs in last 24 hours: Vitals:   02/21/17 1740 02/21/17 2131 02/22/17 0500 02/22/17 0548  BP: (!) 94/55 (!) 107/55  (!) 101/52  Pulse: 80 75  76  Resp: 17 18  18   Temp: 98.9 F (37.2 C) 99.8 F (37.7 C)  97.9 F (36.6 C)  TempSrc: Oral Oral  Oral  SpO2: 98% 99%  100%  Weight:  62.1 kg (137 lb) 62.1 kg (136 lb 14.5 oz)   Height:       Weight change: 0.243 kg (8.6 oz)  Physical Exam: General: alert NAD Elderly AAF, calm , OX3 Heart: RRR no mur, Rub or gallop / still has some left sided Chest discomfort with Palpitation  Lungs: CTA bilat Abdomen: BS [email or web address] R quad tenderness / GB drainage tube  with drainage bag/ non distended Extremities: no pedal edema Dialysis Access: L fem avgg Pos bruit / R IJ Perm cath   OP [facility]h 400/800 67kg 2/2.25 P2 R IJ cath/ maturing L thigh AVG placed 02/12/17 Dr. Randie Heinz /Hep 6000 - mircera 225 last 3/8, Fe 50/wk, no vdra - labs: Hb9.8 tsat 19% , had course of Fe recently, ipth 158   Problem/Plan:  1. C. Diff- RX per admit 2. Abd pain and hypotension / recent perc chole tube for acute cholecysititis -was on empiric abx for suspected sepsis.   3. Volume - is down -5kg under dry wt, no vol excess 4. ESRD TTS HD on schedule k 3.2 use/check pre hd  Use 4k bath 5. ? Seizures- noted  Neg EEG / rx per admit / Neuro 6. 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 Anemia of CKD/ And Iron DEF- hgb 7.4 .8.1  redosed ESA 200 darbe on 3/24 venofer load  With 18% TFS . Fu hgb trend   8. Hx COPD 9. PAF CAD with AICD - on amio/ Sr on exam today / was on MTP 25 XL at home not in [place].? l chest discomfort with Palpitation  10. DM type 2  - off insulin now  Lenny Pastel, PA-C [facility] Beeper [phone] 02/22/2017,8:45 AM  LOS: 5 days    Renal Attending: I agree with note as written above. BP soft so will add  midodrine. Pt for HD. Khilynn Borntreger C   Labs: Basic Metabolic Panel:  Recent Labs Lab 02/17/17 0356 02/18/17 0415 02/19/17 0528 02/19/17 1544 02/20/17 0845  NA 133* 138 137  --  140  K 3.7 3.5 3.3*  --  3.2*  CL 91* 99* 98*  --  101  CO2 24 29 27   --  26  GLUCOSE 157* 84 84  --  74  BUN 25* 9 15  --  19  CREATININE 4.31* 2.46* 3.62*  --  4.63*  CALCIUM 8.1* 8.0* 8.1*  --  8.5*  PHOS 6.4*  --   --  3.0 3.4   Liver Function Tests:  Recent Labs Lab 02/16/17 1644 02/20/17 0845  AST 16  --   ALT 7*  --   ALKPHOS 141*  --   BILITOT 1.2  --   PROT 8.3*  --   ALBUMIN 1.6* 2.1*    Recent Labs Lab 02/16/17 1644  LIPASE <10*    Recent Labs Lab 02/19/17 1811  AMMONIA 31   CBC:  Recent Labs Lab 02/16/17 1644 02/17/17 0356 02/18/17 0415 02/20/17 0845  WBC 9.4 11.0* 8.5 7.7  NEUTROABS 7.1 9.0*  --   --   HGB 8.6* 9.2* 7.4* 8.1*  HCT 29.7* 31.1* 25.7* 27.8*  MCV 82.3 81.6 82.1 81.8  PLT 264 291 204 228   Cardiac Enzymes: No results for input(s): CKTOTAL, CKMB, CKMBINDEX, TROPONINI in the last 168 hours. CBG:  Recent Labs Lab 02/18/17 2018 02/19/17 0752 02/19/17 1526 02/19/17 1652 02/19/17 2050  GLUCAP 91 84 99 103* 92    Studies/Results: No results found. Medications:  . amiodarone  200 mg Oral Daily  . darbepoetin (ARANESP) injection - DIALYSIS  200 mcg Intravenous Q Sat-HD  . feeding supplement (NEPRO CARB STEADY)  237 mL Oral BID BM  . ferric gluconate (FERRLECIT/NULECIT) IV  125 mg Intravenous Q T,Th,Sa-HD  . heparin subcutaneous  5,000 Units Subcutaneous Q8H  . lidocaine  1 patch Transdermal Q24H  . mometasone-formoterol  2 puff Inhalation BID  . multivitamin  1 tablet Oral QHS  . promethazine  12.5 mg Oral Once  . tiotropium  18 mcg Inhalation Daily

## 2017-02-22 NOTE — Progress Notes (Signed)
I was called to reevaluate as the patient had a another spell.  Description of the event involves the patient feeling lightheaded, seen to be hypotensive in the 70s. She subsequently became unresponsive for 5-6 seconds associated with convulsive activity during this 5-6 seconds with rapid return to consciousness afterwards.  The patient today is pleasant, alert, oriented, face symmetric, no drift bilaterally, intact sensation.  The brevity, association with lightheadedness and hypertension, and rapid return to baseline all are very suggestive of convulsive syncope as opposed to seizure.  I would avoid hypotension, but would not start antiepileptic therapy based on the spell.   Please call if there are any further questions or concerns for neurology.  Ritta Slot, MD [facility] [phone]  If 7pm- 7am, please page neurology on call as listed in AMION.

## 2017-02-22 NOTE — Progress Notes (Addendum)
PROGRESS NOTE                                                                                                                                                                                                             Patient Demographics:    Brandy Dudley, is a 66 y.o. female, DOB - June 21, 1951, ZHY:[id number]  Admit date - 02/16/2017   Admitting Physician Leslye Peer, MD  Outpatient Primary MD for the patient is [name], MD  LOS - 5  Outpatient Specialists: renal  Chief Complaint  Patient presents with  . Hypotension       Brief Narrative  66 year old female with history of ESRD on hemodialysis (TTS), CAD status post PCI, ischemic cardiomyopathy with EF of 30% status post AICD, diabetes mellitus on insulin, DVT on anticoagulation, recent hospitalization for acute cholecystitis with cholecystostomy tube placed by IR who became hypotensive during dialysis. She also complained of left upper quadrant abdominal pain. On presentation she was hypertensive and given 500 mL normal saline bolus and admitted to ICU. Lactic acid was normal while pro calcitonin was high. Patient placed on empiric antibiotics and monitored in ICU. Required pressor support with norepinephrine which was weaned off. Patient was transferred to hospitalist service and 60/25. Remained stable and was plan to be discharged back to SNF on 3/26 however developed jerky movements and acutely became confused.   Patient had some reported convulsive activity seen during hypotensive episode   Subjective:   Pt has no new complaints. Neurology contacted to reevaluate patient.   Assessment  & Plan :   Principle problem Hypovolemic versus septic shock. Resolved. Blood Cultures negative. Has chronic low blood pressure.  - no leukocytosis or sign of infection and patient afebrile as such septic shock less likely  Seizure like activity - Neurology on board and  not suspecting seizures. Recommending to avoid hypotension.  Coronary artery disease with history of CHF status post AICD.  Reports left-sided chest pain which is musculoskeletal and reproducible on pressure. Continue statin and beta blocker. Received Lidoderm patch briefly. Continue home pain medications.  Diabetes mellitus type 2 CBG well controlled. Resume home dose Lantus and sliding scale coverage.  A Fib Rate controlled. Continue amiodarone.  ESRD on HD (TTS) Renal following. Tolerated dialysis on 3/24.  History of remote DVT No longer on anticoagulant.  History of  COPD Resume Dulera and Spiriva.  Recent cholecystitis status post cholecystostomy tube. Cholecystostomy tube appears to function well with good drainage. Follow with IR as scheduled.  Consults: PCCM Nephrology  Procedures:    head CT EEG  Family communication: None at bedside  Disposition: addendum: Given episode during dialysis please see above will monitor again overnight for seizure-like activity. If no seizure like activity will plan on discharging to SNF next a.m.    Lab Results  Component Value Date   PLT 175 02/22/2017    Antibiotics  :    Anti-infectives    Start     Dose/Rate Route Frequency Ordered Stop   02/22/17 1100  vancomycin (VANCOCIN) 50 mg/mL oral solution 125 mg     125 mg Oral 4 times daily 02/22/17 1037 03/08/17 0959   02/21/17 1200  levofloxacin (LEVAQUIN) IVPB 500 mg     500 mg 100 mL/hr over 60 Minutes Intravenous Every 48 hours 02/19/17 1020 02/21/17 1355   02/21/17 0000  levofloxacin (LEVAQUIN) 500 MG tablet     500 mg Oral  Once 02/19/17 1046 02/21/17 2359   02/20/17 1200  vancomycin (VANCOCIN) IVPB 750 mg/150 ml premix  Status:  Discontinued     750 mg 150 mL/hr over 60 Minutes Intravenous Every T-Th-Sa (Hemodialysis) 02/18/17 0927 02/19/17 1001   02/19/17 1200  levofloxacin (LEVAQUIN) IVPB 750 mg     750 mg 100 mL/hr over 90 Minutes Intravenous  Once  02/19/17 1020 02/19/17 1328   02/18/17 1000  vancomycin (VANCOCIN) IVPB 750 mg/150 ml premix     750 mg 150 mL/hr over 60 Minutes Intravenous  Once 02/18/17 0927 02/18/17 1134   02/16/17 2000  vancomycin (VANCOCIN) 1,500 mg in sodium chloride 0.9 % 500 mL IVPB     1,500 mg 250 mL/hr over 120 Minutes Intravenous  Once 02/16/17 1945 02/17/17 0300   02/16/17 1600  piperacillin-tazobactam (ZOSYN) IVPB 3.375 g  Status:  Discontinued     3.375 g 12.5 mL/hr over 240 Minutes Intravenous Every 12 hours 02/16/17 1538 02/19/17 1001        Objective:   Vitals:   02/22/17 0915 02/22/17 0918 02/22/17 0922 02/22/17 0959  BP: (!) 73/34 (!) 80/28 136/70 (!) 112/50  Pulse: 87 75 92 79  Resp:    16  Temp:    97.6 F (36.4 C)  TempSrc:    Oral  SpO2:    98%  Weight:      Height:        Wt Readings from Last 3 Encounters:  02/22/17 62.1 kg (136 lb 14.5 oz)  02/16/17 74.4 kg (164 lb)  02/15/17 74.4 kg (164 lb)     Intake/Output Summary (Last 24 hours) at 02/22/17 1644 Last data filed at 02/22/17 1300  Gross per 24 hour  Intake              240 ml  Output             -450 ml  Net              690 ml     Physical Exam General: not in distress, awake and alert HEENT: Moist mucosa, supple neck Cardiovascular: Normal S1 and S2, no murmurs rub or gallop Chest: Clear to auscultation bilaterally, reproducible pain underneath the left breast, no rash GI: Soft, NT, ND, bowel sounds +, cholecystostomy tube draining biliary fluid Musculoskeletal: Warm, no edema    Data Review:    CBC  Recent Labs Lab 02/16/17  1644 02/17/17 0356 02/18/17 0415 02/20/17 0845 02/22/17 0913  WBC 9.4 11.0* 8.5 7.7 8.7  HGB 8.6* 9.2* 7.4* 8.1* 8.0*  HCT 29.7* 31.1* 25.7* 27.8* 27.2*  PLT 264 291 204 228 175  MCV 82.3 81.6 82.1 81.8 81.7  MCH 23.8* 24.1* 23.6* 23.8* 24.0*  MCHC 29.0* 29.6* 28.8* 29.1* 29.4*  RDW 17.0* 17.1* 17.2* 17.2* 17.5*  LYMPHSABS 1.1 1.2  --   --   --   MONOABS 1.1* 0.6  --   --    --   EOSABS 0.0 0.1  --   --   --   BASOSABS 0.0 0.0  --   --   --     Chemistries   Recent Labs Lab 02/16/17 1644 02/17/17 0356 02/18/17 0415 02/19/17 0528 02/20/17 0845 02/22/17 0912  NA 135 133* 138 137 140 136  K 4.1 3.7 3.5 3.3* 3.2* 3.3*  CL 91* 91* 99* 98* 101 99*  CO2 24 24 29 27 26 27   GLUCOSE 76 157* 84 84 74 111*  BUN 21* 25* 9 15 19 18   CREATININE 3.91* 4.31* 2.46* 3.62* 4.63* 4.30*  CALCIUM 8.0* 8.1* 8.0* 8.1* 8.5* 8.2*  MG  --  2.0 1.9 1.8  --   --   AST 16  --   --   --   --   --   ALT 7*  --   --   --   --   --   ALKPHOS 141*  --   --   --   --   --   BILITOT 1.2  --   --   --   --   --    ------------------------------------------------------------------------------------------------------------------ No results for input(s): CHOL, HDL, LDLCALC, TRIG, CHOLHDL, LDLDIRECT in the last 72 hours.  Lab Results  Component Value Date   HGBA1C 5 12/25/2016   ------------------------------------------------------------------------------------------------------------------ No results for input(s): TSH, T4TOTAL, T3FREE, THYROIDAB in the last 72 hours.  Invalid input(s): FREET3 ------------------------------------------------------------------------------------------------------------------ No results for input(s): VITAMINB12, FOLATE, FERRITIN, TIBC, IRON, RETICCTPCT in the last 72 hours.  Coagulation profile No results for input(s): INR, PROTIME in the last 168 hours.  No results for input(s): DDIMER in the last 72 hours.  Cardiac Enzymes No results for input(s): CKMB, TROPONINI, MYOGLOBIN in the last 168 hours.  Invalid input(s): CK ------------------------------------------------------------------------------------------------------------------    Component Value Date/Time   BNP 2,422.0 (H) 02/16/2017 1644    Inpatient Medications  Scheduled Meds: . amiodarone  200 mg Oral Daily  . darbepoetin (ARANESP) injection - DIALYSIS  200 mcg Intravenous Q  Sat-HD  . feeding supplement (NEPRO CARB STEADY)  237 mL Oral BID BM  . ferric gluconate (FERRLECIT/NULECIT) IV  125 mg Intravenous Q T,Th,Sa-HD  . heparin subcutaneous  5,000 Units Subcutaneous Q8H  . lidocaine  1 patch Transdermal Q24H  . midodrine  5 mg Oral TID WC  . mometasone-formoterol  2 puff Inhalation BID  . multivitamin  1 tablet Oral QHS  . promethazine  12.5 mg Oral Once  . tiotropium  18 mcg Inhalation Daily  . vancomycin  125 mg Oral QID   Continuous Infusions: PRN Meds:.sodium chloride, sodium chloride, sodium chloride, acetaminophen, alteplase, chlorproMAZINE, heparin, heparin, lidocaine (PF), lidocaine-prilocaine, LORazepam, pentafluoroprop-tetrafluoroeth  Micro Results Recent Results (from the past 240 hour(s))  Blood Culture (routine x 2)     Status: None   Collection Time: 02/16/17  4:44 PM  Result Value Ref Range Status   Specimen Description BLOOD LEFT HAND  Final  Special Requests AEROBIC BOTTLE ONLY 5CC  Final   Culture NO GROWTH 5 DAYS  Final   Report Status 02/21/2017 FINAL  Final  C difficile quick scan w PCR reflex     Status: Abnormal   Collection Time: 02/21/17  7:49 AM  Result Value Ref Range Status   C Diff antigen POSITIVE (A) NEGATIVE Final   C Diff toxin NEGATIVE NEGATIVE Final   C Diff interpretation Results are indeterminate. See PCR results.  Final  Clostridium Difficile by PCR     Status: Abnormal   Collection Time: 02/21/17  7:49 AM  Result Value Ref Range Status   Toxigenic C Difficile by pcr POSITIVE (A) NEGATIVE Final    Comment: Positive for toxigenic C. difficile with little to no toxin production. Only treat if clinical presentation suggests symptomatic illness.    Radiology Reports Dg Chest 2 View  Result Date: 02/16/2017 CLINICAL DATA:  Confusion and hypotension EXAM: CHEST  2 VIEW COMPARISON:  09/16/2016 FINDINGS: Dialysis catheter is noted in the right jugular vein in satisfactory position. This is new from the prior exam. A  defibrillator is again seen and stable. Cardiomegaly is again noted. Mild vascular congestion is seen likely of a chronic nature. No focal infiltrate or sizable effusion is seen. IMPRESSION: Mild vascular congestion which may be related to volume overload. No other focal abnormality is seen. Electronically Signed   By: Alcide Clever M.D.   On: 02/16/2017 16:13   Ct Head Wo Contrast  Result Date: 02/19/2017 CLINICAL DATA:  Acute encephalopathy EXAM: CT HEAD WITHOUT CONTRAST TECHNIQUE: Contiguous axial images were obtained from the base of the skull through the vertex without intravenous contrast. COMPARISON:  None. FINDINGS: Brain: Age-appropriate involutional changes. No acute large vascular territory infarction, hemorrhage nor midline shift. No intra-axial mass nor extra-axial fluid. Minimal small vessel ischemic changes of periventricular white matter. No effacement of the basal cisterns. Fourth ventricle is midline. There is no hydrocephalus. Vascular: Right vertebral and bilateral carotid siphon calcifications. No unexpected calcifications are hyperdense vessels. Skull: Normal. Negative for fracture or focal lesion. Sinuses/Orbits: Trace fluid in the sphenoid sinus. Mild membrane thickening of the ethmoid sinus. Other: Mastoids are clear. IMPRESSION: Chronic appearing small vessel ischemic disease of periventricular white matter. No acute intracranial abnormality. Trace sphenoid sinus fluid. Electronically Signed   By: Tollie Eth M.D.   On: 02/19/2017 19:29   Ct Abdomen Pelvis W Contrast  Result Date: 02/16/2017 CLINICAL DATA:  66 year old female with multiple medical comorbidities status post transhepatic percutaneous cholecystostomy tube on 02/02/2017. Abdominal pain and hypotension. Initial encounter. EXAM: CT ABDOMEN AND PELVIS WITH CONTRAST TECHNIQUE: Multidetector CT imaging of the abdomen and pelvis was performed using the standard protocol following bolus administration of intravenous contrast.  CONTRAST:  ISOVUE-300 IOPAMIDOL (ISOVUE-300) INJECTION 61%, however, it appears the vast majority of a contrast dose extravasated into the left upper extremity as seen on coronal image 80. COMPARISON:  CT Abdomen and Pelvis 02/01/2017, and earlier. FINDINGS: Essentially this is a noncontrast exam may in light of the IV extravasation into the left upper extremity. Lower chest: Left lower chest wall cardiac AICD type device with streak artifact. Severe cardiomegaly. No pericardial effusion. Continued medial left lower lobe atelectasis or consolidation with superimposed calcified granuloma. Interval improved ventilation at the right lung base with residual peribronchial atelectasis. Virtually resolved bilateral small pleural effusions. Hepatobiliary: Transhepatic percutaneous cholecystostomy tube in place with decompressed gallbladder. No biliary ductal enlargement. No discrete liver lesion. Pancreas: Negative. Spleen: Negative. Adrenals/Urinary  Tract: Mild bilateral adrenal gland thickening compatible with hyperplasia stable. The kidneys appears stable without hydronephrosis or perinephric stranding. Diminutive, unremarkable urinary bladder. No abdominal free fluid. Stomach/Bowel: Decreased stool ball in the rectum but increased circumferential rectal wall thickening, up to 19 mm (series 21, image 69). Similar retained stool in the sigmoid colon and left colon. Similar retained stool in the transverse colon and right colon. Negative appendix (series 21, image 61). No dilated small bowel. Negative stomach and duodenum. Vascular/Lymphatic: Extensive Aortoiliac calcified atherosclerosis. Partially visible left femoral region dialysis graft. Poor vascular contrast bolus. Vascular patency not evaluated. Reproductive: Negative. Other: No pelvic free fluid. Rectus muscle diastases with small developing fat and small bowel containing ventral abdominal hernia, non incarcerated. See sagittal image 79. Musculoskeletal:  Chronic severe rose to changes throughout the visible lower thoracic spine to the T12 level, not new but progressed since September 2017. However, these bony changes are new since June of 2017. The superior L1 vertebral body now is being eroded, was intact in September. The erosive changes are accompanied by partially visible pedicle fractures at T7. The visible lower ribs remain intact. Severe lower lumbar facet arthropathy. Pelvis and proximal femurs intact. IMPRESSION: 1. Extravasation of the contrast for this study into the patient left upper extremity near the antecubital fossa. Recommend physical exam evaluation of the IV site, elevation of the extremity, and ice application. Recommend surveillance for progressive skin changes, which if detected should prompt plastic surgery consultation. 2. Progressively abnormal appearance of the visualized thoracic spine with unusual vertebral body erosion which has developed since June of last year. New erosion of the superior T1 vertebral body since September. A pathologic fracture of T7 is suspected but incompletely visualized. 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 3. Right transhepatic percutaneous cholecystostomy tube with no adverse features. 4. Decreased rectal fecal impaction but circumferential rectal wall thickening suggestive of proctitis. However, no other bowel inflammation. No bowel obstruction. 5. Resolved small bilateral pleural effusions since earlier this month. Residual lower lobe atelectasis. 6. Severe cardiomegaly. Extensive calcified aortoiliac atherosclerosis. 7. Salient findings on this exam were discussed by telephone with Dr. Drema Pry on 02/16/2017 at 1930 hours. Electronically Signed   By: Odessa Fleming M.D.   On: 02/16/2017 19:34   Ct Abdomen Pelvis W Contrast  Result Date: 02/01/2017 CLINICAL DATA:  Initial evaluation for right-sided abdominal pain for 1  month. EXAM: CT ABDOMEN AND PELVIS WITH CONTRAST TECHNIQUE: Multidetector CT imaging of the abdomen and pelvis was performed using the standard protocol following bolus administration of intravenous contrast. CONTRAST:  ISOVUE-300 IOPAMIDOL (ISOVUE-300) INJECTION 61% COMPARISON:  Prior CT from 08/26/2016. FINDINGS: Lower chest: Small layering bilateral pleural effusions with associated bibasilar atelectasis. Cardiomegaly with pacemaker electrodes partially visualized. Hepatobiliary: Few scattered subcentimeter hypodensities noted within the liver, too small the characterize, but may reflect small cyst. Liver otherwise unremarkable. Mucosal edema with mild fuzzy stranding seen about the gallbladder, which may reflect sequelae of acute cholecystitis. Question trace free pericholecystic fluid. No definite internal stones identified. No appreciable biliary dilatation. Pancreas: Pancreas within normal limits. Spleen: Spleen within normal limits. Adrenals/Urinary Tract: Adrenal glands are normal. Kidneys equal in size with symmetric enhancement. Few scattered subcentimeter hypodensities noted within the right kidney, too small the characterize, but statistically likely reflects small cyst. Kidneys otherwise unremarkable without nephrolithiasis, hydronephrosis, or focal enhancing renal mass. No hydroureter. Bladder largely decompressed. Circumferential bladder wall thickening likely related incomplete distension. Stomach/Bowel:  Stomach within normal limits. No evidence for bowel obstruction. Appendix well visualized within the right lower quadrant and is within normal limits without associated inflammatory changes to suggest acute appendicitis. No acute inflammatory changes seen about the bowels. Large amount of impacted stool within the rectal vault, suggesting constipation, similar to previous. Vascular/Lymphatic: Moderate aorto bi-iliac atherosclerotic disease. No aneurysm. No pathologically enlarged  intra-abdominal or pelvic lymph nodes. Reproductive: Small amount of fluid present within the endometrial canal. Uterus otherwise unremarkable. Ovaries within normal limits. Other: No free air or fluid. Fat containing paraumbilical hernia noted. With without closely approximated at the base of the hernia without associated inflammation or obstruction. Musculoskeletal: Mild diffuse anasarca noted, likely related to overall volume status. Progressive height loss with endplate irregularity within the visualized vertebral bodies of the thoracic spine, suspected to be related to dialysis/renal osteodystrophy given patient history. No definite acute osseous abnormality. Bilateral facet arthrosis noted within the lower lumbar spine. No other worrisome lytic or blastic osseous lesions. IMPRESSION: 1. Mild mucosal enhancement with small volume free pericholecystic fluid and subtle hazy stranding about the gallbladder. Findings may reflect sequelae of acute cholecystitis. Correlation were laboratory values recommended. Additionally, this could be further assessed with dedicated right upper quadrant ultrasound as indicated. 2. Large amount of impacted stool within the rectal vault, similar to previous. 3. Cardiomegaly with bilateral pleural effusions and diffuse anasarca. Associated bibasilar atelectasis within the lung bases. 4. 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 Electronically Signed   By: Rise Mu M.D.   On: 02/01/2017 23:28   Ir Perc Cholecystostomy  Result Date: 02/02/2017 INDICATION: 65 year old female with numerous medical problems including congestive heart failure, COPD, and end-stage renal disease. She has acute cholecystitis and is not considered a surgical candidate. She presents for placement of a percutaneous cholecystostomy tube. EXAM: CHOLECYSTOSTOMY MEDICATIONS:  Patient recently received intravenous Zosyn. No additional antibiotic prophylaxis was administered. ANESTHESIA/SEDATION: Moderate (conscious) sedation was employed during this procedure. A total of Versed 1 mg and Fentanyl 100 mcg was administered intravenously. Moderate Sedation Time: 10 minutes. The patient's level of consciousness and vital signs were monitored continuously by radiology nursing throughout the procedure under my direct supervision. FLUOROSCOPY TIME:  Fluoroscopy Time: 0 minutes 54 seconds (3 mGy). COMPLICATIONS: None immediate. PROCEDURE: Informed written consent was obtained from the patient after a thorough discussion of the procedural risks, benefits and alternatives. All questions were addressed. Maximal Sterile Barrier Technique was utilized including caps, mask, sterile gowns, sterile gloves, sterile drape, hand hygiene and skin antiseptic. A timeout was performed prior to the initiation of the procedure. The right upper quadrant was interrogated with ultrasound. The distended gallbladder was successfully identified. The colon was also identified and no was made of its location. Local anesthesia was attained by infiltration with 1% lidocaine. A small dermatotomy was made. Under real-time sonographic guidance, the gallbladder lumen was punctured with a 21 gauge micropuncture needle along a short transhepatic course. Care was taken to avoid the nearby hepatic flexure of the colon. There was return of dark brown cloudy bile through the needle hub. A gentle hand injection of contrast material confirmed opacification of the gallbladder lumen. A 0.018 wire was advanced through the needle and coiled in the gallbladder lumen. The needle was exchanged for the Accustick sheath. The micro wire was then exchanged for a short Amplatz wire. The skin tract was then dilated to 10 [place] and a Riner  10.2 [place] all-purpose drainage catheter was advanced over the wire and formed within the gallbladder. A  gentle hand injection of contrast material confirms the tube is located within the gallbladder. The cystic duct is occluded. No definite filling defect to suggest acute cholecystitis. The cholecystostomy tube was connected to gravity bag drainage and secured to the skin with 0 Prolene suture. IMPRESSION: 1. Successful placement of a transhepatic percutaneous cholecystostomy tube for acute cholecystitis. No definite gallstones are visualized. PLAN: 1. Maintain drain to gravity drainage and flush daily. 2. Follow-up in interventional radiology drain clinic in 4 weeks. 3. Given the patient's medical history, she is unlikely to ever become a surgical candidate. If her cystic duct regains patency in the future we may be able to work toward removal of the drainage catheter. Signed, Sterling Big, MD [facility] Radiology Electronically Signed   By: Malachy Moan M.D.   On: 02/02/2017 17:41   Dg Chest Port 1 View  Result Date: 02/19/2017 CLINICAL DATA:  Seizure-like activities.  Dyspnea. EXAM: PORTABLE CHEST 1 VIEW COMPARISON:  02/16/2017 CXR FINDINGS: Stable cardiomegaly with aortic atherosclerosis. Dialysis catheter tip terminates in the right atrium. ICD device projects over the left hemithorax with lead in the expected location of the right ventricle. Interval removal of left IJ central line. No overt pulmonary edema, pulmonary consolidation or pneumothorax. 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 No acute osseous abnormality. IMPRESSION: Stable cardiomegaly with aortic atherosclerosis. No acute pneumonic consolidation or CHF. Electronically Signed   By: Tollie Eth M.D.   On: 02/19/2017 17:43   Dg Chest Portable 1 View  Result Date: 02/16/2017 CLINICAL DATA:  Central line placement.  Initial encounter. EXAM: PORTABLE CHEST 1 VIEW COMPARISON:  Chest radiograph performed earlier today at 4:02 p.m. FINDINGS: The lungs are well-aerated. Mild  vascular congestion is noted. Increased interstitial markings may reflect mild interstitial edema. There is no evidence of pleural effusion or pneumothorax. The cardiomediastinal silhouette is enlarged. A right-sided dual-lumen catheter is noted ending at the right atrium. An AICD is noted overlying the left chest wall, with a lead ending overlying the right ventricle. No acute osseous abnormalities are seen. A left IJ line is noted ending about the distal SVC. IMPRESSION: 1. Left IJ line noted ending about the distal SVC. 2. Mild vascular congestion and cardiomegaly noted. Increased interstitial markings raise concern for mild interstitial edema. Electronically Signed   By: Roanna Raider M.D.   On: 02/16/2017 23:52   Dg Abd Portable 1v  Result Date: 02/20/2017 CLINICAL DATA:  Abdominal pain. EXAM: PORTABLE ABDOMEN - 1 VIEW COMPARISON:  CT 3 days prior FINDINGS: Right upper quadrant cholecystostomy tube remains in place. No evidence of free air on supine views. Mild gaseous gastric distention. No small bowel dilatation to suggest obstruction. Small volume of colonic stool. No radiopaque calculi. Cardiomegaly and pacemaker, partially included. IMPRESSION: No evidence of bowel obstruction. Mild-is gastric distension, nonspecific. Cholecystostomy tube in place. Electronically Signed   By: Rubye Oaks M.D.   On: 02/20/2017 00:26    Time Spent in minutes  35   Penny Pia M.D on 02/22/2017 at 4:44 PM  Between 7am to 7pm - Pager - [phone]  After 7pm go to [email or web address] - password [facility]  [facility] -  Office  [phone]

## 2017-02-22 NOTE — Procedures (Signed)
Called to evaluate pt  On Hemodialysis / Noted to be hypotensive with SBP in 70s , Pt nonverbal / bilat  Arms rigid /tonic like movents / Given  IV NS  With bp improving  To 136/ pt awake alert co left chest discomfort (is more producible to palpation  Again  to chest wall. TAken off hd / K =3.3  co2 =27  Volume ok

## 2017-02-23 MED ORDER — VANCOMYCIN 50 MG/ML ORAL SOLUTION: 125.0000 mg | Freq: Four times a day (QID) | ORAL | 0 refills | Status: DC

## 2017-02-23 MED ORDER — MIDODRINE HCL 5 MG PO TABS: 5.0000 mg | ORAL_TABLET | Freq: Three times a day (TID) | ORAL | 0 refills | Status: DC

## 2017-02-23 MED ORDER — TIOTROPIUM BROMIDE MONOHYDRATE 18 MCG IN CAPS: 18.0000 ug | ORAL_CAPSULE | Freq: Every day | RESPIRATORY_TRACT | 0 refills | Status: AC

## 2017-02-23 MED ORDER — ACETAMINOPHEN 325 MG PO TABS
325.0000 mg | ORAL_TABLET | Freq: Once | ORAL | Status: AC
  Administered 2017-02-23: 325 mg via ORAL
  Filled 2017-02-23: qty 1

## 2017-02-23 NOTE — Progress Notes (Signed)
Subjective:  Reports diarrhea improved, No further hypotensive spells started on Midodrine 5mg  tid yest/ next hd tomor   Objective Vital signs in last 24 hours: Vitals:   02/22/17 1751 02/22/17 2046 02/22/17 2058 02/23/17 0452  BP: (!) 117/58 (!) 117/55    Pulse: 77 69    Resp: 14 15    Temp: 98.5 F (36.9 C) 98.7 F (37.1 C)    TempSrc: Oral Oral    SpO2: 100% 100% 100%   Weight:  62.6 kg (138 lb)  62.6 kg (138 lb 0.1 oz)  Height:       Weight change: -0.043 kg (-1.5 oz)  Physical Exam: General: alert NAD Elderly AAF, calm , OX3 Heart: RRR no mur, no Rub or gallop  Lungs: CTA bilat Abdomen: BS [email or web address] R quad tenderness / GB drainage tube  with drainage bag/ non distended Extremities: no pedal edema Dialysis Access: L fem avgg Pos bruit / R IJ Perm cath   OP [facility] 4h 400/800 67kg 2/2.25 P2 R IJ cath/ maturing L thigh AVG placed 02/12/17 Dr. Randie Heinz /Hep 6000 - mircera 225 last 3/8, Fe 50/wk, no vdra - labs: Hb9.8 tsat 19% , had course of Fe recently, ipth 158   Problem/Plan:  1. C. Diff- RX per admit 2. Abd pain and hypotension / recent perc chole tube for acute cholecysititis -was on empiric abx for suspected sepsis.   3. Volume - is down -4 kg under dry wt, no vol excess/ new edw  63.5 kg  4. [facility] HD on schedule/ shortened HD yest sec Hypotensive  seizures/ now on Midodrine 5mg  tid  k 3.3 yest.  And vol ok today as above  /as op hd  Use 4k bath 5. ? Seizures- noted Neg EEG / rx per admit / Neuro= no  seiz. meds  6. CKD/ MBD - no vdra now/ no binder =/ phos 1.6  Appetite better this am  7. Anemia of CKD/ And Iron DEF- hgb 7.4 >.8.1>8.0  redosed ESA 200 darbe on 3/24 venofer load  With 18% TFS . Fu hgb trend   8. Hx COPD 9. PAF CAD with AICD- on amio/ Sr on exam today / was on MTP 25 XL at home not in Hosp./left  chest discomfort with Palpitation  10. DM type 2  - off insulin now  Lenny Pastel, PA-C [facility] Beeper [phone] 02/23/2017,9:56 AM  LOS: 6 days    Renal: Pt stable.  Concern regarding potential seizure warrants eval and repeat HD in AM  Labs: Basic Metabolic Panel:  Recent Labs Lab 02/19/17 0528 02/19/17 1544 02/20/17 0845 02/22/17 0912  NA 137  --  140 136  K 3.3*  --  3.2* 3.3*  CL 98*  --  101 99*  CO2 27  --  26 27  GLUCOSE 84  --  74 111*  BUN 15  --  19 18  CREATININE 3.62*  --  4.63* 4.30*  CALCIUM 8.1*  --  8.5* 8.2*  PHOS  --  3.0 3.4 1.6*   Liver Function Tests:  Recent Labs Lab 02/16/17 1644 02/20/17 0845 02/22/17 0912  AST 16  --   --   ALT 7*  --   --   ALKPHOS 141*  --   --   BILITOT 1.2  --   --   PROT 8.3*  --   --   ALBUMIN 1.6* 2.1* 1.9*    Recent Labs Lab 02/16/17 1644  LIPASE <  10*    Recent Labs Lab 02/19/17 1811  AMMONIA 31   CBC:  Recent Labs Lab 02/16/17 1644 02/17/17 0356 02/18/17 0415 02/20/17 0845 02/22/17 0913  WBC 9.4 11.0* 8.5 7.7 8.7  NEUTROABS 7.1 9.0*  --   --   --   HGB 8.6* 9.2* 7.4* 8.1* 8.0*  HCT 29.7* 31.1* 25.7* 27.8* 27.2*  MCV 82.3 81.6 82.1 81.8 81.7  PLT 264 291 204 228 175   Cardiac Enzymes: No results for input(s): CKTOTAL, CKMB, CKMBINDEX, TROPONINI in the last 168 hours. CBG:  Recent Labs Lab 02/19/17 0752 02/19/17 1526 02/19/17 1652 02/19/17 2050 02/22/17 0927  GLUCAP 84 99 103* 92 107*    Studies/Results: No results found. Medications:  . amiodarone  200 mg Oral Daily  . darbepoetin (ARANESP) injection - DIALYSIS  200 mcg Intravenous Q Sat-HD  . feeding supplement (NEPRO CARB STEADY)  237 mL Oral BID BM  . ferric gluconate (FERRLECIT/NULECIT) IV  125 mg Intravenous Q T,Th,Sa-HD  . heparin subcutaneous  5,000 Units Subcutaneous Q8H  . lidocaine  1 patch Transdermal Q24H  . midodrine  5 mg Oral TID WC  . mometasone-formoterol  2 puff Inhalation BID  . multivitamin  1 tablet Oral QHS  . promethazine  12.5 mg Oral Once  . tiotropium  18 mcg Inhalation Daily  .  vancomycin  125 mg Oral QID   Renal Attending: I agree with note articulated above. Marvetta Vohs C

## 2017-02-23 NOTE — Progress Notes (Signed)
[facility] here to transport pt.

## 2017-02-23 NOTE — Care Management Important Message (Signed)
Important Message  Patient Details  Name: Brandy Dudley MRN: [id number] Date of Birth: 1950-12-29   Medicare Important Message Given:  Yes    Corey Laski, Annamarie Major, RN 02/23/2017, 10:18 AM

## 2017-02-23 NOTE — Progress Notes (Signed)
Patient discharging to [facility], report given to [facility] (nurse) and answered all her questions. Waiting for the EMS to pick her up, will continue to monitor.

## 2017-02-23 NOTE — Clinical Social Work Note (Addendum)
Patient medically stable for discharge and is returning to [facility] facility.  Discharge clinicals transmitted to facility, sister, Sanoe Fron [phone]) contacted and message left regarding patient's discharge (patient's name not given).  Received call from Ms. Carolyne Littles at 6:27 pm and informed her of patient's discharge. She will contact her sister, [name] to inform her.        Genelle Bal, MSW, LCSW Licensed Clinical Social Worker Clinical Social Work Department [employer] [phone]

## 2017-02-26 ENCOUNTER — Telehealth: Payer: Self-pay

## 2017-02-26 NOTE — Telephone Encounter (Signed)
This is a patient of [facility], who was admitted to [facility] after hospitalization. [facility] - Hospital F/U is needed. Hospital discharge from [facility] on 02/23/2017.

## 2017-02-27 ENCOUNTER — Non-Acute Institutional Stay (SKILLED_NURSING_FACILITY): Payer: Medicare PPO | Admitting: Internal Medicine

## 2017-02-27 ENCOUNTER — Encounter: Payer: Self-pay | Admitting: Internal Medicine

## 2017-02-27 DIAGNOSIS — D638 Anemia in other chronic diseases classified elsewhere: Secondary | ICD-10-CM | POA: Diagnosis not present

## 2017-02-27 DIAGNOSIS — E876 Hypokalemia: Secondary | ICD-10-CM

## 2017-02-27 DIAGNOSIS — J449 Chronic obstructive pulmonary disease, unspecified: Secondary | ICD-10-CM | POA: Diagnosis not present

## 2017-02-27 DIAGNOSIS — R531 Weakness: Secondary | ICD-10-CM

## 2017-02-27 DIAGNOSIS — R109 Unspecified abdominal pain: Secondary | ICD-10-CM | POA: Diagnosis not present

## 2017-02-27 DIAGNOSIS — I953 Hypotension of hemodialysis: Secondary | ICD-10-CM

## 2017-02-27 DIAGNOSIS — E43 Unspecified severe protein-calorie malnutrition: Secondary | ICD-10-CM | POA: Diagnosis not present

## 2017-02-27 DIAGNOSIS — A0472 Enterocolitis due to Clostridium difficile, not specified as recurrent: Secondary | ICD-10-CM | POA: Diagnosis not present

## 2017-02-27 DIAGNOSIS — I480 Paroxysmal atrial fibrillation: Secondary | ICD-10-CM | POA: Diagnosis not present

## 2017-02-27 DIAGNOSIS — G8929 Other chronic pain: Secondary | ICD-10-CM

## 2017-02-27 NOTE — Progress Notes (Signed)
LOCATION: [facility]  PCP: [name], MD   Code Status: Full Code  Goals of care: Advanced Directive information Advanced Directives 02/18/2017  Does Patient Have a Medical Advance Directive? -  Type of Advance Directive -  Does patient want to make changes to medical advance directive? -  Copy of Healthcare Power of Attorney in Chart? -  Would patient like information on creating a medical advance directive? No - Patient declined       Extended Emergency Contact Information Primary Emergency Contact: Haith,[name] Address: [address]          [place], [place] [address] [place] Home Phone: [phone] Relation: Sister Secondary Emergency Contact: Marcello Moores, [place] Home Phone: [phone] Relation: Sister   Allergies  Allergen Reactions  . Entresto [Sacubitril-Valsartan] Other (See Comments)     unknown reaction  . Latex Itching  . Sulfa Antibiotics Itching    Chief Complaint  Patient presents with  . Readmit To SNF    Readmission Visit      HPI:  Patient is a 66 y.o. female seen today for long term care post hospital re-admission from 02/16/17-02/23/17 with hypotension during dialysis. She required pressor support and iv fluids. She was started on antibiotic for c.diff colitis. She has medical history of ESRD on dialysis, chronic CHF with AICD, dm type 2, protein calorie malnutrition among others. She is seen in her room today. Of note, she was in the hospital recently for left femoral loop AV graft with 4-7 stretch graft for permanent dialysis access and also for acute cholecystitis status post IR guided cholecystostomy tube placement for biliary drain. She has PMH of chronic systolic CHF, S/p AICD, CAD s/p stent, ESRD on HD, DM type 2 among others. She is seen in her room today.    Review of Systems:  Constitutional: Negative for fever, chills, diaphoresis. Feels weak and tired. HENT: Negative  for headache, congestion, nasal discharge, difficulty swallowing.   Eyes: Negative for eye pain, blurred vision, double vision and discharge.  Respiratory: Negative for cough, shortness of breath and wheezing.   Cardiovascular: Negative for chest pain, palpitations, leg swelling.  Gastrointestinal: Negative for heartburn, nausea, vomiting,loss of appetite, melena, diarrhea and constipation. Positive for abdominal discomfort on both sides in the upper quadrant. Last bowel movement was this morning and denies any loose stool. Genitourinary: Negative for dysuria and flank pain.  Musculoskeletal: Negative for back pain, fall in the facility.  Skin: Negative for itching, rash.  Neurological: Negative for dizziness. Psychiatric/Behavioral: Negative for depression.   Past Medical History:  Diagnosis Date  . AICD (automatic cardioverter/defibrillator) present    Biotronik Inventra 7 VR-T DX 04/12/15  . Anemia   . Anxiety   . C. difficile colitis   . Cardiomyopathy (HCC)    02/02/16 ([facility]): Mixed ischemic and non-ischemic cardiomyopathy  . Charcot's joint of left foot   . CHF (congestive heart failure) (HCC)    systolic  . Cholelithiases 01/2017  . Complication of anesthesia 02/02/2017   has anxeity when awaken with gallbladder surgery  . COPD (chronic obstructive pulmonary disease) (HCC)   . Coronary artery disease   . Diabetes mellitus (HCC)    Type II  . DVT (deep venous thrombosis) (HCC)    on coumadin  . ESRD (end stage renal disease) (HCC)    tues/thurs/sat dialysis  . Hypertension   . Neuropathy (HCC)   .  Obstructive sleep apnea    no cpap  . Renal disorder   . Wears glasses    Past Surgical History:  Procedure Laterality Date  . AV FISTULA PLACEMENT Left 04/03/2016   Procedure: ARTERIOVENOUS (AV) FISTULA CREATION;  Surgeon: Larina Earthly, MD;  Location: [facility] OR;  Service: Vascular;  Laterality: Left;  . AV FISTULA PLACEMENT Right 08/21/2016   Procedure: INSERTION RIGHT  ARM ARTERIOVENOUS GRAFT USING 4-7MM X45  CM ACUSEAL GRAFT;  Surgeon: Maeola Harman, MD;  Location: [facility] OR;  Service: Vascular;  Laterality: Right;  . AV FISTULA PLACEMENT Left 02/12/2017   Procedure: INSERTION OF ARTERIOVENOUS (AV) GORE-TEX GRAFT THIGH;  Surgeon: Maeola Harman, MD;  Location: [facility] OR;  Service: Vascular;  Laterality: Left;  Using Goretex 4-65mm Vascular Graft  . CORONARY ANGIOPLASTY     LCX stent 2010; by [facility] 01/2016 note: 03/2014 LHC/RHC: minor luminal irregularities LAD and RCA with patent stent first marginal branch of CX. RA 11, PAP 58/22 with mean 39, mean wedge pressure 19, LVEDP 27, PVR 3.9, CO 5.1, Cardiac index 2.5.   . FISTULA SUPERFICIALIZATION Left 06/16/2016   Procedure: SUPERFICIALIZATION OF LEFT ARM BRACHIOCEPHALIC ARTERIOVENOUS FISTULA;  Surgeon: Chuck Hint, MD;  Location: [facility] OR;  Service: Vascular;  Laterality: Left;  . INSERTION OF DIALYSIS CATHETER Left 04/03/2016   Procedure: INSERTION OF DIALYSIS CATHETER;  Surgeon: Larina Earthly, MD;  Location: [facility] OR;  Service: Vascular;  Laterality: Left;  . IR GENERIC HISTORICAL  02/02/2017   IR PERC CHOLECYSTOSTOMY 02/02/2017 Malachy Moan, MD [facility]-INTERV RAD  . LIGATION OF COMPETING BRANCHES OF ARTERIOVENOUS FISTULA Left 06/16/2016   Procedure: LIGATION OF COMPETING BRANCHES OF left brachiocephalic ARTERIOVENOUS FISTULA;  Surgeon: Chuck Hint, MD;  Location: [facility] OR;  Service: Vascular;  Laterality: Left;  . OTHER SURGICAL HISTORY  02/02/2017   Transhepatic percutaneous cholecystostomy placement  . PERIPHERAL VASCULAR CATHETERIZATION N/A 07/20/2016   Procedure: A/V Shuntogram/Fistulagram;  Surgeon: Fransisco Hertz, MD;  Location: [facility] INVASIVE CV LAB;  Service: Cardiovascular;  Laterality: N/A;  . PERIPHERAL VASCULAR CATHETERIZATION N/A 07/20/2016   Procedure: Dialysis/Perma Catheter Insertion;  Surgeon: Fransisco Hertz, MD;  Location: [facility] INVASIVE CV LAB;  Service: Cardiovascular;  Laterality:  N/A;  . REVISION OF ARTERIOVENOUS GORETEX GRAFT Left 06/16/2016   Procedure: REVISION OF LEFT BRACHIOCEPHALIC ARTERIOVENOUS FISTULA WITH RESECTION OF REDUNDANT SECTION;  Surgeon: Chuck Hint, MD;  Location: [facility] OR;  Service: Vascular;  Laterality: Left;   Social History:   reports that she quit smoking about 10 years ago. Her smoking use included Cigarettes. She has never used smokeless tobacco. She reports that she does not drink alcohol or use drugs.  No family history on file.  Medications: Allergies as of 02/27/2017      Reactions   Entresto [sacubitril-valsartan] Other (See Comments)    unknown reaction   Latex Itching   Sulfa Antibiotics Itching      Medication List       Accurate as of 02/27/17  8:40 AM. Always use your most recent med list.          acetaminophen 325 MG tablet Commonly known as:  TYLENOL Take 325 mg by mouth every 6 (six) hours as needed for mild pain or fever (For fever >99.5).   amiodarone 200 MG tablet Commonly known as:  PACERONE Take 1 tablet (200 mg total) by mouth daily.   atorvastatin 10 MG tablet Commonly known as:  LIPITOR Take 10 mg by mouth at  bedtime.   CERTAVITE/ANTIOXIDANTS Tabs Take 1 tablet by mouth at bedtime.   famotidine 20 MG tablet Commonly known as:  PEPCID Take 20 mg by mouth at bedtime.   FIBER CHOICE PO Take 2 tablets by mouth daily as needed (Constipation).   fluticasone 110 MCG/ACT inhaler Commonly known as:  FLOVENT HFA Inhale 2 puffs into the lungs 2 (two) times daily.   hydrOXYzine 50 MG tablet Commonly known as:  ATARAX/VISTARIL Take 50 mg by mouth every 6 (six) hours as needed for itching.   insulin glargine 100 UNIT/ML injection Commonly known as:  LANTUS Inject 8 Units into the skin every morning.   insulin lispro 100 UNIT/ML injection Commonly known as:  HUMALOG Inject 0-10 Units into the skin 3 (three) times daily as needed for high blood sugar (CBG >180). CBG 0-59 Hypoglycemic Protocol,  60-149 0 units 150-250 5 units, 251-300 8 units, 301-350 10 units, >350 call MD   lactose free nutrition Liqd Take 237 mLs by mouth daily.   ENSURE CLEAR Liqd Take 1 each by mouth daily. For 14 days   levofloxacin 500 MG tablet Commonly known as:  LEVAQUIN Take 500 mg by mouth daily.   metoprolol succinate 25 MG 24 hr tablet Commonly known as:  TOPROL-XL Take 12.5 mg by mouth 2 (two) times daily.   midodrine 5 MG tablet Commonly known as:  PROAMATINE Take 1 tablet (5 mg total) by mouth 3 (three) times daily with meals.   mirtazapine 15 MG tablet Commonly known as:  REMERON Take 15 mg by mouth at bedtime.   nitroGLYCERIN 0.4 MG SL tablet Commonly known as:  NITROSTAT Place 0.4 mg under the tongue every 5 (five) minutes as needed for chest pain.   oxyCODONE-acetaminophen 5-325 MG tablet Commonly known as:  PERCOCET/ROXICET Take 1-2 tablets by mouth every 6 (six) hours as needed for moderate pain or severe pain.   OXYGEN Inhale 2 L into the lungs as needed.   PROAIR HFA 108 (90 Base) MCG/ACT inhaler Generic drug:  albuterol Inhale 2 puffs into the lungs every 6 (six) hours as needed for wheezing or shortness of breath.   sertraline 25 MG tablet Commonly known as:  ZOLOFT Take 25 mg by mouth daily.   SYSTANE BALANCE 0.6 % Soln Generic drug:  Propylene Glycol Place 1 drop into both eyes 2 (two) times daily.   tiotropium 18 MCG inhalation capsule Commonly known as:  SPIRIVA Place 1 capsule (18 mcg total) into inhaler and inhale daily.   vancomycin 50 mg/mL oral solution Commonly known as:  VANCOCIN Take 2.5 mLs (125 mg total) by mouth 4 (four) times daily.       Immunizations: Immunization History  Administered Date(s) Administered  . Influenza, High Dose Seasonal PF 08/17/2016  . PPD Test 03/01/2016, 04/10/2016  . Pneumococcal-Unspecified 01/16/2017     Physical Exam: Vitals:   02/27/17 0831  BP: (!) 108/51  Pulse: 70  Resp: 18  Temp: 97 F (36.1  C)  TempSrc: Oral  SpO2: 94%  Weight: 140 lb (63.5 kg)  Height: 5\' 8"  (1.727 m)   Body mass index is 21.29 kg/m.  General- elderly female, chronically ill appearing, frail, in no acute distress Head- normocephalic, atraumatic Nose- no nasal discharge Throat- moist mucus membrane Eyes- PERRLA, EOMI, no pallor, no icterus, no discharge Neck- no cervical lymphadenopathy Cardiovascular- normal s1,s2, no murmur Respiratory- bilateral clear to auscultation, no wheeze, no rhonchi, no crackles, no use of accessory muscles Abdomen- bowel sounds present, soft, tenderness  over upper quadrant, no guarding or rigidity, gall bladder drain to her abdomen with dressing clean and dry Musculoskeletal- able to move all 4 extremities, generalized weakness, no leg edema, left thigh AV graft good thrill, left palpable distal pulses Neurological- alert and oriented to person, place and time Skin- warm and dry, right IJ tunneled catheter Psychiatry- normal mood and affect    Labs reviewed: Basic Metabolic Panel:  Recent Labs  56/21/30 0356 02/18/17 0415 02/19/17 0528 02/19/17 1544 02/20/17 0845 02/22/17 0912  NA 133* 138 137  --  140 136  K 3.7 3.5 3.3*  --  3.2* 3.3*  CL 91* 99* 98*  --  101 99*  CO2 24 29 27   --  26 27  GLUCOSE 157* 84 84  --  74 111*  BUN 25* 9 15  --  19 18  CREATININE 4.31* 2.46* 3.62*  --  4.63* 4.30*  CALCIUM 8.1* 8.0* 8.1*  --  8.5* 8.2*  MG 2.0 1.9 1.8  --   --   --   PHOS 6.4*  --   --  3.0 3.4 1.6*   Liver Function Tests:  Recent Labs  02/01/17 1924  02/12/17 1913 02/16/17 1644 02/20/17 0845 02/22/17 0912  AST 15  --  12* 16  --   --   ALT 9*  --  7* 7*  --   --   ALKPHOS 199*  --  121 141*  --   --   BILITOT 0.6  --  0.3 1.2  --   --   PROT 8.3*  --  7.9 8.3*  --   --   ALBUMIN 1.8*  < > 1.8* 1.6* 2.1* 1.9*  < > = values in this interval not displayed.  Recent Labs  08/26/16 2046 02/01/17 1924 02/16/17 1644  LIPASE 22 19 <10*    Recent  Labs  02/19/17 1811  AMMONIA 31   CBC:  Recent Labs  02/03/17 1559  02/16/17 1644 02/17/17 0356 02/18/17 0415 02/20/17 0845 02/22/17 0913  WBC 9.7  < > 9.4 11.0* 8.5 7.7 8.7  NEUTROABS 7.4  --  7.1 9.0*  --   --   --   HGB 9.0*  < > 8.6* 9.2* 7.4* 8.1* 8.0*  HCT 30.1*  < > 29.7* 31.1* 25.7* 27.8* 27.2*  MCV 81.4  < > 82.3 81.6 82.1 81.8 81.7  PLT 338  < > 264 291 204 228 175  < > = values in this interval not displayed. Cardiac Enzymes: No results for input(s): CKTOTAL, CKMB, CKMBINDEX, TROPONINI in the last 8760 hours. BNP: Invalid input(s): POCBNP CBG:  Recent Labs  02/19/17 1652 02/19/17 2050 02/22/17 0927  GLUCAP 103* 92 107*    Radiological Exams: Dg Chest 2 View  Result Date: 02/16/2017 CLINICAL DATA:  Confusion and hypotension EXAM: CHEST  2 VIEW COMPARISON:  09/16/2016 FINDINGS: Dialysis catheter is noted in the right jugular vein in satisfactory position. This is new from the prior exam. A defibrillator is again seen and stable. Cardiomegaly is again noted. Mild vascular congestion is seen likely of a chronic nature. No focal infiltrate or sizable effusion is seen. IMPRESSION: Mild vascular congestion which may be related to volume overload. No other focal abnormality is seen. Electronically Signed   By: Alcide Clever M.D.   On: 02/16/2017 16:13   Ct Head Wo Contrast  Result Date: 02/19/2017 CLINICAL DATA:  Acute encephalopathy EXAM: CT HEAD WITHOUT CONTRAST TECHNIQUE: Contiguous axial images were obtained from  the base of the skull through the vertex without intravenous contrast. COMPARISON:  None. FINDINGS: Brain: Age-appropriate involutional changes. No acute large vascular territory infarction, hemorrhage nor midline shift. No intra-axial mass nor extra-axial fluid. Minimal small vessel ischemic changes of periventricular white matter. No effacement of the basal cisterns. Fourth ventricle is midline. There is no hydrocephalus. Vascular: Right vertebral and  bilateral carotid siphon calcifications. No unexpected calcifications are hyperdense vessels. Skull: Normal. Negative for fracture or focal lesion. Sinuses/Orbits: Trace fluid in the sphenoid sinus. Mild membrane thickening of the ethmoid sinus. Other: Mastoids are clear. IMPRESSION: Chronic appearing small vessel ischemic disease of periventricular white matter. No acute intracranial abnormality. Trace sphenoid sinus fluid. Electronically Signed   By: Tollie Eth M.D.   On: 02/19/2017 19:29   Ct Abdomen Pelvis W Contrast  Result Date: 02/16/2017 CLINICAL DATA:  66 year old female with multiple medical comorbidities status post transhepatic percutaneous cholecystostomy tube on 02/02/2017. Abdominal pain and hypotension. Initial encounter. EXAM: CT ABDOMEN AND PELVIS WITH CONTRAST TECHNIQUE: Multidetector CT imaging of the abdomen and pelvis was performed using the standard protocol following bolus administration of intravenous contrast. CONTRAST:  ISOVUE-300 IOPAMIDOL (ISOVUE-300) INJECTION 61%, however, it appears the vast majority of a contrast dose extravasated into the left upper extremity as seen on coronal image 80. COMPARISON:  CT Abdomen and Pelvis 02/01/2017, and earlier. FINDINGS: Essentially this is a noncontrast exam may in light of the IV extravasation into the left upper extremity. Lower chest: Left lower chest wall cardiac AICD type device with streak artifact. Severe cardiomegaly. No pericardial effusion. Continued medial left lower lobe atelectasis or consolidation with superimposed calcified granuloma. Interval improved ventilation at the right lung base with residual peribronchial atelectasis. Virtually resolved bilateral small pleural effusions. Hepatobiliary: Transhepatic percutaneous cholecystostomy tube in place with decompressed gallbladder. No biliary ductal enlargement. No discrete liver lesion. Pancreas: Negative. Spleen: Negative. Adrenals/Urinary Tract: Mild bilateral adrenal  gland thickening compatible with hyperplasia stable. The kidneys appears stable without hydronephrosis or perinephric stranding. Diminutive, unremarkable urinary bladder. No abdominal free fluid. Stomach/Bowel: Decreased stool ball in the rectum but increased circumferential rectal wall thickening, up to 19 mm (series 21, image 69). Similar retained stool in the sigmoid colon and left colon. Similar retained stool in the transverse colon and right colon. Negative appendix (series 21, image 61). No dilated small bowel. Negative stomach and duodenum. Vascular/Lymphatic: Extensive Aortoiliac calcified atherosclerosis. Partially visible left femoral region dialysis graft. Poor vascular contrast bolus. Vascular patency not evaluated. Reproductive: Negative. Other: No pelvic free fluid. Rectus muscle diastases with small developing fat and small bowel containing ventral abdominal hernia, non incarcerated. See sagittal image 79. Musculoskeletal: Chronic severe rose to changes throughout the visible lower thoracic spine to the T12 level, not new but progressed since September 2017. However, these bony changes are new since June of 2017. The superior L1 vertebral body now is being eroded, was intact in September. The erosive changes are accompanied by partially visible pedicle fractures at T7. The visible lower ribs remain intact. Severe lower lumbar facet arthropathy. Pelvis and proximal femurs intact. IMPRESSION: 1. Extravasation of the contrast for this study into the patient left upper extremity near the antecubital fossa. Recommend physical exam evaluation of the IV site, elevation of the extremity, and ice application. Recommend surveillance for progressive skin changes, which if detected should prompt plastic surgery consultation. 2. Progressively abnormal appearance of the visualized thoracic spine with unusual vertebral body erosion which has developed since June of last year. New erosion of  the superior T1  vertebral body since September. A pathologic fracture of T7 is suspected but incompletely visualized. 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 3. Right transhepatic percutaneous cholecystostomy tube with no adverse features. 4. Decreased rectal fecal impaction but circumferential rectal wall thickening suggestive of proctitis. However, no other bowel inflammation. No bowel obstruction. 5. Resolved small bilateral pleural effusions since earlier this month. Residual lower lobe atelectasis. 6. Severe cardiomegaly. Extensive calcified aortoiliac atherosclerosis. 7. Salient findings on this exam were discussed by telephone with Dr. Drema Pry on 02/16/2017 at 1930 hours. Electronically Signed   By: Odessa Fleming M.D.   On: 02/16/2017 19:34   Ct Abdomen Pelvis W Contrast  Result Date: 02/01/2017 CLINICAL DATA:  Initial evaluation for right-sided abdominal pain for 1 month. EXAM: CT ABDOMEN AND PELVIS WITH CONTRAST TECHNIQUE: Multidetector CT imaging of the abdomen and pelvis was performed using the standard protocol following bolus administration of intravenous contrast. CONTRAST:  ISOVUE-300 IOPAMIDOL (ISOVUE-300) INJECTION 61% COMPARISON:  Prior CT from 08/26/2016. FINDINGS: Lower chest: Small layering bilateral pleural effusions with associated bibasilar atelectasis. Cardiomegaly with pacemaker electrodes partially visualized. Hepatobiliary: Few scattered subcentimeter hypodensities noted within the liver, too small the characterize, but may reflect small cyst. Liver otherwise unremarkable. Mucosal edema with mild fuzzy stranding seen about the gallbladder, which may reflect sequelae of acute cholecystitis. Question trace free pericholecystic fluid. No definite internal stones identified. No appreciable biliary dilatation. Pancreas: Pancreas within normal limits. Spleen: Spleen within normal limits. Adrenals/Urinary Tract: Adrenal  glands are normal. Kidneys equal in size with symmetric enhancement. Few scattered subcentimeter hypodensities noted within the right kidney, too small the characterize, but statistically likely reflects small cyst. Kidneys otherwise unremarkable without nephrolithiasis, hydronephrosis, or focal enhancing renal mass. No hydroureter. Bladder largely decompressed. Circumferential bladder wall thickening likely related incomplete distension. Stomach/Bowel: Stomach within normal limits. No evidence for bowel obstruction. Appendix well visualized within the right lower quadrant and is within normal limits without associated inflammatory changes to suggest acute appendicitis. No acute inflammatory changes seen about the bowels. Large amount of impacted stool within the rectal vault, suggesting constipation, similar to previous. Vascular/Lymphatic: Moderate aorto bi-iliac atherosclerotic disease. No aneurysm. No pathologically enlarged intra-abdominal or pelvic lymph nodes. Reproductive: Small amount of fluid present within the endometrial canal. Uterus otherwise unremarkable. Ovaries within normal limits. Other: No free air or fluid. Fat containing paraumbilical hernia noted. With without closely approximated at the base of the hernia without associated inflammation or obstruction. Musculoskeletal: Mild diffuse anasarca noted, likely related to overall volume status. Progressive height loss with endplate irregularity within the visualized vertebral bodies of the thoracic spine, suspected to be related to dialysis/renal osteodystrophy given patient history. No definite acute osseous abnormality. Bilateral facet arthrosis noted within the lower lumbar spine. No other worrisome lytic or blastic osseous lesions. IMPRESSION: 1. Mild mucosal enhancement with small volume free pericholecystic fluid and subtle hazy stranding about the gallbladder. Findings may reflect sequelae of acute cholecystitis. Correlation were laboratory  values recommended. Additionally, this could be further assessed with dedicated right upper quadrant ultrasound as indicated. 2. Large amount of impacted stool within the rectal vault, similar to previous. 3. Cardiomegaly with bilateral pleural effusions and diffuse anasarca. Associated bibasilar atelectasis within the lung bases. 4. 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 Electronically Signed   By: Janell Quiet.D.  On: 02/01/2017 23:28   Ir Perc Cholecystostomy  Result Date: 02/02/2017 INDICATION: 66 year old female with numerous medical problems including congestive heart failure, COPD, and end-stage renal disease. She has acute cholecystitis and is not considered a surgical candidate. She presents for placement of a percutaneous cholecystostomy tube. EXAM: CHOLECYSTOSTOMY MEDICATIONS: Patient recently received intravenous Zosyn. No additional antibiotic prophylaxis was administered. ANESTHESIA/SEDATION: Moderate (conscious) sedation was employed during this procedure. A total of Versed 1 mg and Fentanyl 100 mcg was administered intravenously. Moderate Sedation Time: 10 minutes. The patient's level of consciousness and vital signs were monitored continuously by radiology nursing throughout the procedure under my direct supervision. FLUOROSCOPY TIME:  Fluoroscopy Time: 0 minutes 54 seconds (3 mGy). COMPLICATIONS: None immediate. PROCEDURE: Informed written consent was obtained from the patient after a thorough discussion of the procedural risks, benefits and alternatives. All questions were addressed. Maximal Sterile Barrier Technique was utilized including caps, mask, sterile gowns, sterile gloves, sterile drape, hand hygiene and skin antiseptic. A timeout was performed prior to the initiation of the procedure. The right upper quadrant was interrogated with ultrasound.  The distended gallbladder was successfully identified. The colon was also identified and no was made of its location. Local anesthesia was attained by infiltration with 1% lidocaine. A small dermatotomy was made. Under real-time sonographic guidance, the gallbladder lumen was punctured with a 21 gauge micropuncture needle along a short transhepatic course. Care was taken to avoid the nearby hepatic flexure of the colon. There was return of dark brown cloudy bile through the needle hub. A gentle hand injection of contrast material confirmed opacification of the gallbladder lumen. A 0.018 wire was advanced through the needle and coiled in the gallbladder lumen. The needle was exchanged for the Accustick sheath. The micro wire was then exchanged for a short Amplatz wire. The skin tract was then dilated to 10 [place] and a Cook 10.2 [place] all-purpose drainage catheter was advanced over the wire and formed within the gallbladder. A gentle hand injection of contrast material confirms the tube is located within the gallbladder. The cystic duct is occluded. No definite filling defect to suggest acute cholecystitis. The cholecystostomy tube was connected to gravity bag drainage and secured to the skin with 0 Prolene suture. IMPRESSION: 1. Successful placement of a transhepatic percutaneous cholecystostomy tube for acute cholecystitis. No definite gallstones are visualized. PLAN: 1. Maintain drain to gravity drainage and flush daily. 2. Follow-up in interventional radiology drain clinic in 4 weeks. 3. Given the patient's medical history, she is unlikely to ever become a surgical candidate. If her cystic duct regains patency in the future we may be able to work toward removal of the drainage catheter. Signed, Sterling Big, MD [facility] Radiology Electronically Signed   By: Malachy Moan M.D.   On: 02/02/2017 17:41   Dg Chest Port 1 View  Result Date:  02/19/2017 CLINICAL DATA:  Seizure-like activities.  Dyspnea. EXAM: PORTABLE CHEST 1 VIEW COMPARISON:  02/16/2017 CXR FINDINGS: Stable cardiomegaly with aortic atherosclerosis. Dialysis catheter tip terminates in the right atrium. ICD device projects over the left hemithorax with lead in the expected location of the right ventricle. Interval removal of left IJ central line. No overt pulmonary edema, pulmonary consolidation or pneumothorax. 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 No acute osseous abnormality. IMPRESSION: Stable cardiomegaly with aortic atherosclerosis. No acute pneumonic consolidation or CHF. Electronically Signed   By: Tollie Eth M.D.   On: 02/19/2017 17:43   Dg Chest Portable 1  View  Result Date: 02/16/2017 CLINICAL DATA:  Central line placement.  Initial encounter. EXAM: PORTABLE CHEST 1 VIEW COMPARISON:  Chest radiograph performed earlier today at 4:02 p.m. FINDINGS: The lungs are well-aerated. Mild vascular congestion is noted. Increased interstitial markings may reflect mild interstitial edema. There is no evidence of pleural effusion or pneumothorax. The cardiomediastinal silhouette is enlarged. A right-sided dual-lumen catheter is noted ending at the right atrium. An AICD is noted overlying the left chest wall, with a lead ending overlying the right ventricle. No acute osseous abnormalities are seen. A left IJ line is noted ending about the distal SVC. IMPRESSION: 1. Left IJ line noted ending about the distal SVC. 2. Mild vascular congestion and cardiomegaly noted. Increased interstitial markings raise concern for mild interstitial edema. Electronically Signed   By: Roanna Raider M.D.   On: 02/16/2017 23:52   Dg Abd Portable 1v  Result Date: 02/20/2017 CLINICAL DATA:  Abdominal pain. EXAM: PORTABLE ABDOMEN - 1 VIEW COMPARISON:  CT 3 days prior FINDINGS: Right upper quadrant cholecystostomy tube remains in place. No evidence of free air on supine views. Mild gaseous  gastric distention. No small bowel dilatation to suggest obstruction. Small volume of colonic stool. No radiopaque calculi. Cardiomegaly and pacemaker, partially included. IMPRESSION: No evidence of bowel obstruction. Mild-is gastric distension, nonspecific. Cholecystostomy tube in place. Electronically Signed   By: Rubye Oaks M.D.   On: 02/20/2017 00:26    Assessment/Plan  Generalized weakness From deconditioning. Has had frequent hospitalization. Had a detailed discussion about goals of care with patient. She would like to be able to walk around and perform her activities of daily living independently. She was last able to perform these activities independently more than a year back. she is tired of being in and out of the hospital and wants to focus on comfort care. She also mentions that she would like to stay alive and be able to attend her niece's graduation. She appears to have poor insight into her medical problem. Get palliative care consult.   c.diff colitis Continue and complete 2 weeks course of po vancomycin 125 mg qid. Monitor bowel movement. Maintain hydration  Chronic Abdominal pain With her c.diff colitis and acute cholecystitis s/p biliary drain. Discontinue levofloxacin. No nausea or vomiting. Afebrile at present. Make f/u with general surgery to assess further for surgery for acute choecystitis. Reviewed need to abstain from fatty meal. She would still like to have ramen noodle for now.   hypokalemia Monitor BMP with her cdiff colitis, she denies any loose stool today. Check bmp and mg level.   Anemia of chronic disease Monitor cbc, no bleed reported  Hypotension Related to HD and recent infection. Continue midodrine 5 mg tid. Change metoprolol succinate to 12.5 mg daily with low BP reading. Monitor VS.   afib Controlled HR. Continue amiodarone, decrease metoprolol succinate dosing as above. s/p AICD   protein calorie malnutrition Currently on Ensure clear  supplement. Followed by registered dietitian.  COPD Breathing currently stable, currently on spiriva, flovent, oxygen on a needed basis along with Ventolin every 6 hours as needed.   Goals of care: long term care   Labs/tests ordered: cbc, bmp, mg  Family/ staff Communication: reviewed care plan with patient and nursing supervisor  I have spent greater than 40 minutes for this encounter which includes reviewing hospital records, addressing above mentioned concerns, reviewing care plan with patient, answering patient's concerns and counseling her.     [name], MD Internal Medicine [facility]  [employer] [place], [place] [address] Cell Phone (Monday-Friday 8 am - 5 pm): [phone] On Call: [phone] and follow prompts after 5 pm and on weekends Office Phone: [phone] Office Fax: [phone]

## 2017-02-28 ENCOUNTER — Other Ambulatory Visit: Payer: Self-pay | Admitting: General Surgery

## 2017-02-28 DIAGNOSIS — K810 Acute cholecystitis: Secondary | ICD-10-CM

## 2017-03-01 LAB — BASIC METABOLIC PANEL
BUN: 44 mg/dL — AB (ref 4–21)
CREATININE: 6 mg/dL — AB (ref 0.5–1.1)
Glucose: 115 mg/dL
Potassium: 4.1 mmol/L (ref 3.4–5.3)
Sodium: 142 mmol/L (ref 137–147)

## 2017-03-01 LAB — CBC AND DIFFERENTIAL
HEMATOCRIT: 29 % — AB (ref 36–46)
HEMOGLOBIN: 8.5 g/dL — AB (ref 12.0–16.0)
PLATELETS: 132 10*3/uL — AB (ref 150–399)
WBC: 9.7 10*3/mL

## 2017-03-03 ENCOUNTER — Emergency Department (HOSPITAL_COMMUNITY): Payer: Medicare Other

## 2017-03-03 ENCOUNTER — Inpatient Hospital Stay (HOSPITAL_COMMUNITY)
Admission: EM | Admit: 2017-03-03 | Discharge: 2017-03-05 | DRG: 312 | Disposition: A | Discharge status: Skilled Nursing Facility | Payer: Medicare Other | Attending: Internal Medicine | Admitting: Internal Medicine

## 2017-03-03 ENCOUNTER — Encounter (HOSPITAL_COMMUNITY): Payer: Self-pay | Admitting: Emergency Medicine

## 2017-03-03 DIAGNOSIS — Z87891 Personal history of nicotine dependence: Secondary | ICD-10-CM

## 2017-03-03 DIAGNOSIS — E1140 Type 2 diabetes mellitus with diabetic neuropathy, unspecified: Secondary | ICD-10-CM | POA: Diagnosis present

## 2017-03-03 DIAGNOSIS — F419 Anxiety disorder, unspecified: Secondary | ICD-10-CM | POA: Diagnosis present

## 2017-03-03 DIAGNOSIS — E11649 Type 2 diabetes mellitus with hypoglycemia without coma: Secondary | ICD-10-CM | POA: Diagnosis present

## 2017-03-03 DIAGNOSIS — I255 Ischemic cardiomyopathy: Secondary | ICD-10-CM | POA: Diagnosis present

## 2017-03-03 DIAGNOSIS — G4733 Obstructive sleep apnea (adult) (pediatric): Secondary | ICD-10-CM | POA: Diagnosis present

## 2017-03-03 DIAGNOSIS — Z9861 Coronary angioplasty status: Secondary | ICD-10-CM | POA: Diagnosis not present

## 2017-03-03 DIAGNOSIS — N186 End stage renal disease: Secondary | ICD-10-CM | POA: Diagnosis present

## 2017-03-03 DIAGNOSIS — I953 Hypotension of hemodialysis: Principal | ICD-10-CM | POA: Diagnosis present

## 2017-03-03 DIAGNOSIS — K819 Cholecystitis, unspecified: Secondary | ICD-10-CM | POA: Diagnosis present

## 2017-03-03 DIAGNOSIS — J449 Chronic obstructive pulmonary disease, unspecified: Secondary | ICD-10-CM | POA: Diagnosis present

## 2017-03-03 DIAGNOSIS — I5022 Chronic systolic (congestive) heart failure: Secondary | ICD-10-CM | POA: Diagnosis not present

## 2017-03-03 DIAGNOSIS — Z79891 Long term (current) use of opiate analgesic: Secondary | ICD-10-CM | POA: Diagnosis not present

## 2017-03-03 DIAGNOSIS — D696 Thrombocytopenia, unspecified: Secondary | ICD-10-CM | POA: Diagnosis present

## 2017-03-03 DIAGNOSIS — I480 Paroxysmal atrial fibrillation: Secondary | ICD-10-CM | POA: Diagnosis not present

## 2017-03-03 DIAGNOSIS — Z7951 Long term (current) use of inhaled steroids: Secondary | ICD-10-CM

## 2017-03-03 DIAGNOSIS — Z882 Allergy status to sulfonamides status: Secondary | ICD-10-CM

## 2017-03-03 DIAGNOSIS — R55 Syncope and collapse: Secondary | ICD-10-CM

## 2017-03-03 DIAGNOSIS — Z992 Dependence on renal dialysis: Secondary | ICD-10-CM | POA: Diagnosis not present

## 2017-03-03 DIAGNOSIS — Z95810 Presence of automatic (implantable) cardiac defibrillator: Secondary | ICD-10-CM

## 2017-03-03 DIAGNOSIS — E1122 Type 2 diabetes mellitus with diabetic chronic kidney disease: Secondary | ICD-10-CM | POA: Diagnosis not present

## 2017-03-03 DIAGNOSIS — Z91040 Latex allergy status: Secondary | ICD-10-CM

## 2017-03-03 DIAGNOSIS — Z79899 Other long term (current) drug therapy: Secondary | ICD-10-CM

## 2017-03-03 DIAGNOSIS — I132 Hypertensive heart and chronic kidney disease with heart failure and with stage 5 chronic kidney disease, or end stage renal disease: Secondary | ICD-10-CM | POA: Diagnosis present

## 2017-03-03 DIAGNOSIS — I472 Ventricular tachycardia: Secondary | ICD-10-CM | POA: Diagnosis present

## 2017-03-03 DIAGNOSIS — I2510 Atherosclerotic heart disease of native coronary artery without angina pectoris: Secondary | ICD-10-CM | POA: Diagnosis not present

## 2017-03-03 DIAGNOSIS — Z888 Allergy status to other drugs, medicaments and biological substances status: Secondary | ICD-10-CM

## 2017-03-03 DIAGNOSIS — Z86718 Personal history of other venous thrombosis and embolism: Secondary | ICD-10-CM

## 2017-03-03 DIAGNOSIS — D631 Anemia in chronic kidney disease: Secondary | ICD-10-CM | POA: Diagnosis present

## 2017-03-03 DIAGNOSIS — Z794 Long term (current) use of insulin: Secondary | ICD-10-CM | POA: Diagnosis not present

## 2017-03-03 DIAGNOSIS — F329 Major depressive disorder, single episode, unspecified: Secondary | ICD-10-CM | POA: Diagnosis present

## 2017-03-03 LAB — BASIC METABOLIC PANEL
ANION GAP: 14 (ref 5–15)
BUN: 22 mg/dL — ABNORMAL HIGH (ref 6–20)
CALCIUM: 8.4 mg/dL — AB (ref 8.9–10.3)
CO2: 25 mmol/L (ref 22–32)
CREATININE: 4.26 mg/dL — AB (ref 0.44–1.00)
Chloride: 100 mmol/L — ABNORMAL LOW (ref 101–111)
GFR, EST AFRICAN AMERICAN: 12 mL/min — AB (ref >60)
GFR, EST NON AFRICAN AMERICAN: 10 mL/min — AB (ref >60)
GLUCOSE: 73 mg/dL (ref 65–99)
Potassium: 3 mmol/L — ABNORMAL LOW (ref 3.5–5.1)
Sodium: 139 mmol/L (ref 135–145)

## 2017-03-03 LAB — CBG MONITORING, ED: Glucose-Capillary: 69 mg/dL (ref 65–99)

## 2017-03-03 LAB — CBC
HCT: 31.3 % — ABNORMAL LOW (ref 36.0–46.0)
HEMOGLOBIN: 9.3 g/dL — AB (ref 12.0–15.0)
MCH: 24.7 pg — ABNORMAL LOW (ref 26.0–34.0)
MCHC: 29.7 g/dL — ABNORMAL LOW (ref 30.0–36.0)
MCV: 83.2 fL (ref 78.0–100.0)
Platelets: 54 10*3/uL — ABNORMAL LOW (ref 150–400)
RBC: 3.76 MIL/uL — AB (ref 3.87–5.11)
RDW: 20.2 % — ABNORMAL HIGH (ref 11.5–15.5)
WBC: 14.7 10*3/uL — ABNORMAL HIGH (ref 4.0–10.5)

## 2017-03-03 MED ORDER — ALBUTEROL SULFATE (2.5 MG/3ML) 0.083% IN NEBU: 3.0000 mL | INHALATION_SOLUTION | Freq: Four times a day (QID) | RESPIRATORY_TRACT | Status: DC | PRN

## 2017-03-03 MED ORDER — ENSURE CLEAR PO LIQD: 1.0000 | Freq: Every day | ORAL | Status: DC

## 2017-03-03 MED ORDER — SODIUM CHLORIDE 0.9% FLUSH
3.0000 mL | Freq: Two times a day (BID) | INTRAVENOUS | Status: DC
  Administered 2017-03-04 – 2017-03-05 (×4): 3 mL via INTRAVENOUS

## 2017-03-03 MED ORDER — POLYVINYL ALCOHOL 1.4 % OP SOLN
1.0000 [drp] | Freq: Two times a day (BID) | OPHTHALMIC | Status: DC
  Administered 2017-03-04 – 2017-03-05 (×3): 1 [drp] via OPHTHALMIC
  Filled 2017-03-03: qty 15

## 2017-03-03 MED ORDER — TIOTROPIUM BROMIDE MONOHYDRATE 18 MCG IN CAPS
18.0000 ug | ORAL_CAPSULE | Freq: Every day | RESPIRATORY_TRACT | Status: DC
  Administered 2017-03-04: 18 ug via RESPIRATORY_TRACT
  Filled 2017-03-03: qty 5

## 2017-03-03 MED ORDER — SERTRALINE HCL 25 MG PO TABS
25.0000 mg | ORAL_TABLET | Freq: Every day | ORAL | Status: DC
  Administered 2017-03-04 – 2017-03-05 (×2): 25 mg via ORAL
  Filled 2017-03-03 (×2): qty 1

## 2017-03-03 MED ORDER — INSULIN GLARGINE 100 UNIT/ML ~~LOC~~ SOLN
8.0000 [IU] | Freq: Every morning | SUBCUTANEOUS | Status: DC
Start: 2017-03-04 — End: 2017-03-04
  Filled 2017-03-03: qty 0.08

## 2017-03-03 MED ORDER — MIRTAZAPINE 15 MG PO TABS
15.0000 mg | ORAL_TABLET | Freq: Every day | ORAL | Status: DC
  Administered 2017-03-04 (×2): 15 mg via ORAL
  Filled 2017-03-03 (×2): qty 1

## 2017-03-03 MED ORDER — HYDROXYZINE HCL 25 MG PO TABS
50.0000 mg | ORAL_TABLET | Freq: Four times a day (QID) | ORAL | Status: DC | PRN
  Administered 2017-03-04 – 2017-03-05 (×2): 50 mg via ORAL
  Filled 2017-03-03 (×2): qty 2

## 2017-03-03 MED ORDER — METOPROLOL SUCCINATE 12.5 MG HALF TABLET
12.5000 mg | ORAL_TABLET | Freq: Two times a day (BID) | ORAL | Status: DC
  Filled 2017-03-03 (×2): qty 1

## 2017-03-03 MED ORDER — VANCOMYCIN 50 MG/ML ORAL SOLUTION
125.0000 mg | Freq: Four times a day (QID) | ORAL | Status: DC
  Filled 2017-03-03: qty 2.5

## 2017-03-03 MED ORDER — MIDODRINE HCL 5 MG PO TABS
5.0000 mg | ORAL_TABLET | Freq: Three times a day (TID) | ORAL | Status: DC
  Administered 2017-03-04: 5 mg via ORAL
  Filled 2017-03-03: qty 1

## 2017-03-03 MED ORDER — AMIODARONE HCL 200 MG PO TABS
200.0000 mg | ORAL_TABLET | Freq: Every day | ORAL | Status: DC
  Administered 2017-03-04 – 2017-03-05 (×2): 200 mg via ORAL
  Filled 2017-03-03 (×2): qty 1

## 2017-03-03 MED ORDER — BOOST PLUS PO LIQD
237.0000 mL | Freq: Every day | ORAL | Status: DC
  Administered 2017-03-04 – 2017-03-05 (×2): 237 mL via ORAL
  Filled 2017-03-03 (×3): qty 237

## 2017-03-03 MED ORDER — FAMOTIDINE 20 MG PO TABS
20.0000 mg | ORAL_TABLET | Freq: Every day | ORAL | Status: DC
  Administered 2017-03-04 (×2): 20 mg via ORAL
  Filled 2017-03-03 (×2): qty 1

## 2017-03-03 MED ORDER — ACETAMINOPHEN 325 MG PO TABS
325.0000 mg | ORAL_TABLET | Freq: Four times a day (QID) | ORAL | Status: DC | PRN
  Administered 2017-03-04: 325 mg via ORAL
  Filled 2017-03-03: qty 1

## 2017-03-03 MED ORDER — OXYCODONE-ACETAMINOPHEN 5-325 MG PO TABS
1.0000 | ORAL_TABLET | Freq: Four times a day (QID) | ORAL | Status: DC | PRN
  Administered 2017-03-04 – 2017-03-05 (×3): 2 via ORAL
  Filled 2017-03-03 (×3): qty 2

## 2017-03-03 MED ORDER — FLUTICASONE PROPIONATE HFA 110 MCG/ACT IN AERO: 2.0000 | INHALATION_SPRAY | Freq: Two times a day (BID) | RESPIRATORY_TRACT | Status: DC

## 2017-03-03 MED ORDER — LIDOCAINE 5 % EX PTCH: 1.0000 | MEDICATED_PATCH | CUTANEOUS | Status: DC

## 2017-03-03 MED ORDER — LEVOFLOXACIN 500 MG PO TABS: 500.0000 mg | ORAL_TABLET | Freq: Every day | ORAL | Status: DC

## 2017-03-03 MED ORDER — INSULIN ASPART 100 UNIT/ML ~~LOC~~ SOLN: 0.0000 [IU] | Freq: Three times a day (TID) | SUBCUTANEOUS | Status: DC | PRN

## 2017-03-03 MED ORDER — ATORVASTATIN CALCIUM 10 MG PO TABS
10.0000 mg | ORAL_TABLET | Freq: Every day | ORAL | Status: DC
  Administered 2017-03-04 (×2): 10 mg via ORAL
  Filled 2017-03-03 (×3): qty 1

## 2017-03-03 MED ORDER — BUDESONIDE 0.5 MG/2ML IN SUSP
1.0000 mg | Freq: Two times a day (BID) | RESPIRATORY_TRACT | Status: DC
  Administered 2017-03-04: 0.5 mg via RESPIRATORY_TRACT
  Administered 2017-03-04 – 2017-03-05 (×3): 1 mg via RESPIRATORY_TRACT
  Filled 2017-03-03 (×5): qty 4

## 2017-03-03 MED ORDER — ADULT MULTIVITAMIN W/MINERALS CH
1.0000 | ORAL_TABLET | Freq: Every day | ORAL | Status: DC
  Administered 2017-03-04 (×2): 1 via ORAL
  Filled 2017-03-03 (×2): qty 1

## 2017-03-03 MED ORDER — PROPYLENE GLYCOL 0.6 % OP SOLN: 1.0000 [drp] | Freq: Two times a day (BID) | OPHTHALMIC | Status: DC

## 2017-03-03 NOTE — ED Notes (Signed)
Called [employer], and gave the nurses number for further questions.

## 2017-03-03 NOTE — ED Notes (Signed)
Called [employer], pt did take Midodrine this morning prior to dialysis.

## 2017-03-03 NOTE — ED Provider Notes (Signed)
[facility]-EMERGENCY DEPT Provider Note   CSN: [id number] Arrival date & time: 03/03/17  1550     History   Chief Complaint Chief Complaint  Patient presents with  . Loss of Consciousness    HPI Brandy Dudley is a 66 y.o. female.  Patient with PMH of ESRD on hemodialysis (TTS), CAD status post PCI, ischemic cardiomyopathy with EF of 30% status post AICD, diabetes mellitus on insulin, DVT on anticoagulation, recent hospitalization for acute cholecystitis with cholecystostomy tube placed by IR who presents to the ED with a chief complaint of LOC.  She states that she was sitting in HD today and "blacked out briefly."  She attributes her symptoms to sleep apnea, and not being on oxygen.  She reports that she has had some pain under her left ribs, and is being treated for pneumonia.  She states that she still has some abdominal pain which has been the same since being treated for cholecystitis by T-tube.  She denies any other associated symptoms today.     The history is provided by the patient. No language interpreter was used.    Past Medical History:  Diagnosis Date  . AICD (automatic cardioverter/defibrillator) present    [employer] Inventra 7 VR-T DX 04/12/15  . Anemia   . Anxiety   . C. difficile colitis   . Cardiomyopathy (HCC)    02/02/16 ([facility]): Mixed ischemic and non-ischemic cardiomyopathy  . Charcot's joint of left foot   . CHF (congestive heart failure) (HCC)    systolic  . Cholelithiases 01/2017  . Complication of anesthesia 02/02/2017   has anxeity when awaken with gallbladder surgery  . COPD (chronic obstructive pulmonary disease) (HCC)   . Coronary artery disease   . Diabetes mellitus (HCC)    Type II  . DVT (deep venous thrombosis) (HCC)    on coumadin  . ESRD (end stage renal disease) (HCC)    tues/thurs/sat dialysis  . Hypertension   . Neuropathy (HCC)   . Obstructive sleep apnea    no cpap  . Renal disorder   . Wears glasses     Patient Active  Problem List   Diagnosis Date Noted  . PAF (paroxysmal atrial fibrillation) (HCC) 02/27/2017  . Hypotension 02/19/2017  . Shock (HCC) 02/17/2017  . Acute cholecystitis 02/02/2017  . Preoperative clearance   . Protein-calorie malnutrition, severe 07/18/2016  . Positive blood culture 07/17/2016  . Atherosclerotic peripheral vascular disease (HCC) 07/17/2016  . Bacteremia 07/17/2016  . Depression 07/05/2016  . Umbilical hernia without obstruction or gangrene 07/05/2016  . Generalized anxiety disorder 07/05/2016  . Loss of weight 07/05/2016  . Chronic combined systolic and diastolic CHF (congestive heart failure) (HCC) 05/07/2016  . 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 Anemia of chronic renal failure, stage 5 (HCC) 04/14/2016  . ESRD on dialysis (HCC)   . History of Clostridium difficile colitis 03/20/2016  . COPD (chronic obstructive pulmonary disease) (HCC) 03/20/2016  . Obstructive sleep apnea 03/20/2016  . AICD (automatic cardioverter/defibrillator) present 03/20/2016  . Physical deconditioning 03/02/2016  . DVT (deep venous thrombosis), right 03/02/2016  . Cardiomyopathy, ischemic 03/02/2016  . Coronary artery disease involving native coronary artery of native heart without angina pectoris 03/02/2016  . Diabetes mellitus type 2 in obese (HCC) 03/02/2016    Past Surgical History:  Procedure Laterality Date  . AV FISTULA PLACEMENT Left 04/03/2016   Procedure: ARTERIOVENOUS (AV) FISTULA CREATION;  Surgeon: Larina Earthly, MD;  Location: [facility] OR;  Service: Vascular;  Laterality: Left;  . AV FISTULA PLACEMENT Right 08/21/2016   Procedure: INSERTION RIGHT ARM ARTERIOVENOUS GRAFT USING 4-7MM X45  CM ACUSEAL GRAFT;  Surgeon: Maeola Harman, MD;  Location: [facility] OR;  Service: Vascular;  Laterality: Right;  . AV FISTULA PLACEMENT Left 02/12/2017   Procedure: INSERTION OF ARTERIOVENOUS (AV) GORE-TEX GRAFT THIGH;  Surgeon: Maeola Harman, MD;   Location: [facility] OR;  Service: Vascular;  Laterality: Left;  Using Goretex 4-54mm Vascular Graft  . CORONARY ANGIOPLASTY     LCX stent 2010; by [facility] 01/2016 note: 03/2014 LHC/RHC: minor luminal irregularities LAD and RCA with patent stent first marginal branch of CX. RA 11, PAP 58/22 with mean 39, mean wedge pressure 19, LVEDP 27, PVR 3.9, CO 5.1, Cardiac index 2.5.   . FISTULA SUPERFICIALIZATION Left 06/16/2016   Procedure: SUPERFICIALIZATION OF LEFT ARM BRACHIOCEPHALIC ARTERIOVENOUS FISTULA;  Surgeon: Chuck Hint, MD;  Location: [facility] OR;  Service: Vascular;  Laterality: Left;  . INSERTION OF DIALYSIS CATHETER Left 04/03/2016   Procedure: INSERTION OF DIALYSIS CATHETER;  Surgeon: Larina Earthly, MD;  Location: [facility] OR;  Service: Vascular;  Laterality: Left;  . IR GENERIC HISTORICAL  02/02/2017   IR PERC CHOLECYSTOSTOMY 02/02/2017 Malachy Moan, MD [facility]INTER[facility] RAD  . LIGATION OF COMPETING BRANCHES OF ARTERIOVENOUS FISTULA Left 06/16/2016   Procedure: LIGATION OF COMPETING BRANCHES OF left brachiocephalic ARTERIOVENOUS FISTULA;  Surgeon: Chuck Hint, MD;  Location: [facility] OR;  Service: Vascular;  Laterality: Left;  . OTHER SURGICAL HISTORY  02/02/2017   Transhepatic percutaneous cholecystostomy placement  . PERIPHERAL VASCULAR CATHETERIZATION N/A 07/20/2016   Procedure: A/V Shuntogram/Fistulagram;  Surgeon: Fransisco Hertz, MD;  Location: [facility] INVASIVE CV LAB;  Service: Cardiovascular;  Laterality: N/A;  . PERIPHERAL VASCULAR CATHETERIZATION N/A 07/20/2016   Procedure: Dialysis/Perma Catheter Insertion;  Surgeon: Fransisco Hertz, MD;  Location: [facility] INVASIVE CV LAB;  Service: Cardiovascular;  Laterality: N/A;  . REVISION OF ARTERIOVENOUS GORETEX GRAFT Left 06/16/2016   Procedure: REVISION OF LEFT BRACHIOCEPHALIC ARTERIOVENOUS FISTULA WITH RESECTION OF REDUNDANT SECTION;  Surgeon: Chuck Hint, MD;  Location: [facility] OR;  Service: Vascular;  Laterality: Left;    OB History    No data available         Home Medications    Prior to Admission medications   Medication Sig Start Date End Date Taking? Authorizing Provider  acetaminophen (TYLENOL) 325 MG tablet Take 325 mg by mouth every 6 (six) hours as needed for mild pain or fever (For fever >99.5).     Historical Provider, MD  albuterol (PROAIR HFA) 108 (90 Base) MCG/ACT inhaler Inhale 2 puffs into the lungs every 6 (six) hours as needed for wheezing or shortness of breath.     Historical Provider, MD  amiodarone (PACERONE) 200 MG tablet Take 1 tablet (200 mg total) by mouth daily. 10/11/16   Marinus Maw, MD  atorvastatin (LIPITOR) 10 MG tablet Take 10 mg by mouth at bedtime.     Historical Provider, MD  famotidine (PEPCID) 20 MG tablet Take 20 mg by mouth at bedtime.    Historical Provider, MD  fluticasone (FLOVENT HFA) 110 MCG/ACT inhaler Inhale 2 puffs into the lungs 2 (two) times daily.     Historical Provider, MD  hydrOXYzine (ATARAX/VISTARIL) 50 MG tablet Take 50 mg by mouth every 6 (six) hours as needed for itching.    Historical Provider, MD  insulin glargine (LANTUS) 100 UNIT/ML injection Inject 8 Units into the skin every morning.  Historical Provider, MD  insulin lispro (HUMALOG) 100 UNIT/ML injection Inject 0-10 Units into the skin 3 (three) times daily as needed for high blood sugar (CBG >180). CBG 0-59 Hypoglycemic Protocol, 60-149 0 units 150-250 5 units, 251-300 8 units, 301-350 10 units, >350 call MD    Historical Provider, MD  Inulin (FIBER CHOICE PO) Take 2 tablets by mouth daily as needed (Constipation).     Historical Provider, MD  lactose free nutrition (BOOST PLUS) LIQD Take 237 mLs by mouth daily.     Historical Provider, MD  levofloxacin (LEVAQUIN) 500 MG tablet Take 500 mg by mouth daily.    Historical Provider, MD  metoprolol succinate (TOPROL-XL) 25 MG 24 hr tablet Take 12.5 mg by mouth 2 (two) times daily.    Historical Provider, MD  midodrine (PROAMATINE) 5 MG tablet Take 1 tablet (5 mg total) by mouth 3  (three) times daily with meals. 02/23/17   Penny Pia, MD  mirtazapine (REMERON) 15 MG tablet Take 15 mg by mouth at bedtime.    Historical Provider, MD  Multiple Vitamins-Minerals (CERTAVITE/ANTIOXIDANTS) TABS Take 1 tablet by mouth at bedtime.    Historical Provider, MD  nitroGLYCERIN (NITROSTAT) 0.4 MG SL tablet Place 0.4 mg under the tongue every 5 (five) minutes as needed for chest pain.    Historical Provider, MD  Nutritional Supplements (ENSURE CLEAR) LIQD Take 1 each by mouth daily. For 14 days    Historical Provider, MD  oxyCODONE-acetaminophen (PERCOCET/ROXICET) 5-325 MG tablet Take 1-2 tablets by mouth every 6 (six) hours as needed for moderate pain or severe pain.    Historical Provider, MD  OXYGEN Inhale 2 L into the lungs as needed.    Historical Provider, MD  Propylene Glycol (SYSTANE BALANCE) 0.6 % SOLN Place 1 drop into both eyes 2 (two) times daily.    Historical Provider, MD  sertraline (ZOLOFT) 25 MG tablet Take 25 mg by mouth daily.     Historical Provider, MD  tiotropium (SPIRIVA) 18 MCG inhalation capsule Place 1 capsule (18 mcg total) into inhaler and inhale daily. 02/24/17   Penny Pia, MD  vancomycin (VANCOCIN) 50 mg/mL oral solution Take 2.5 mLs (125 mg total) by mouth 4 (four) times daily. 02/23/17 03/07/17  Penny Pia, MD    Family History History reviewed. No pertinent family history.  Social History Social History  Substance Use Topics  . Smoking status: Former Smoker    Types: Cigarettes    Quit date: 03/03/2006  . Smokeless tobacco: Never Used  . Alcohol use No     Allergies   Entresto [sacubitril-valsartan]; Latex; and Sulfa antibiotics   Review of Systems Review of Systems  All other systems reviewed and are negative.    Physical Exam Updated Vital Signs BP (!) 124/57 (BP Location: Right Leg)   Pulse 60   Temp 97.7 F (36.5 C) (Oral)   Resp 16   SpO2 100%   Physical Exam  Constitutional: She is oriented to person, place, and time. She  appears well-developed and well-nourished.  HENT:  Head: Normocephalic and atraumatic.  Eyes: Conjunctivae and EOM are normal. Pupils are equal, round, and reactive to light.  Neck: Normal range of motion. Neck supple.  Cardiovascular: Normal rate and regular rhythm.  Exam reveals no gallop and no friction rub.   No murmur heard. Pulmonary/Chest: Effort normal and breath sounds normal. No respiratory distress. She has no wheezes. She has no rales. She exhibits no tenderness.  Abdominal: Soft. Bowel sounds are normal. She exhibits  no distension and no mass. There is tenderness. There is no rebound and no guarding.  RUQ biliary drain in place  Musculoskeletal: Normal range of motion. She exhibits no edema or tenderness.  Neurological: She is alert and oriented to person, place, and time.  Skin: Skin is warm and dry.  Psychiatric: She has a normal mood and affect. Her behavior is normal. Judgment and thought content normal.  Nursing note and vitals reviewed.    ED Treatments / Results  Labs (all labs ordered are listed, but only abnormal results are displayed) Labs Reviewed  BASIC METABOLIC PANEL - Abnormal; Notable for the following:       Result Value   Potassium 3.0 (*)    Chloride 100 (*)    BUN 22 (*)    Creatinine, Ser 4.26 (*)    Calcium 8.4 (*)    GFR calc non Af Amer 10 (*)    GFR calc Af Amer 12 (*)    All other components within normal limits  CBC - Abnormal; Notable for the following:    WBC 14.7 (*)    RBC 3.76 (*)    Hemoglobin 9.3 (*)    HCT 31.3 (*)    MCH 24.7 (*)    MCHC 29.7 (*)    RDW 20.2 (*)    Platelets 54 (*)    All other components within normal limits  CBC  BASIC METABOLIC PANEL  CBG MONITORING, ED    EKG  EKG Interpretation  Date/Time:  Saturday March 03 2017 16:04:01 EDT Ventricular Rate:  54 PR Interval:    QRS Duration: 122 QT Interval:  484 QTC Calculation: 458 R Axis:   3 Text Interpretation:  Sinus bradycardia Possible Lateral  infarct , age undetermined Abnormal ECG when compared to prior, slower rate.  No STEMI Confirmed by Rush Landmark MD, CHRISTOPHER [phone]) on 03/03/2017 5:01:25 PM       Radiology Dg Chest 2 View  Result Date: 03/03/2017 CLINICAL DATA:  Status post syncope during dialysis. Acute onset of left-sided and central chest pain. Initial encounter. EXAM: CHEST  2 VIEW COMPARISON:  Chest radiograph performed 02/19/2017 FINDINGS: The lungs are hypoexpanded. No pleural effusion or pneumothorax is seen. Minimal bilateral atelectasis is noted. The cardiomediastinal silhouette is mildly enlarged. A right-sided dual-lumen catheter is noted ending about the proximal right atrium. An AICD is noted at the left chest wall, with a single lead ending at the right ventricle. No acute osseous abnormalities are identified. IMPRESSION: Lungs hypoexpanded. Minimal bilateral atelectasis seen. Mild cardiomegaly. Electronically Signed   By: Roanna Raider M.D.   On: 03/03/2017 17:51   Ct Head Wo Contrast  Result Date: 03/03/2017 CLINICAL DATA:  Syncope and seizure while at dialysis today EXAM: CT HEAD WITHOUT CONTRAST TECHNIQUE: Contiguous axial images were obtained from the base of the skull through the vertex without intravenous contrast. COMPARISON:  02/19/2017 FINDINGS: Brain: Age-appropriate involutional changes. No acute large vascular territory infarction, hemorrhage nor midline shift. No intra-axial mass nor extra-axial fluid. Minimal small vessel ischemic changes of periventricular white matter. No effacement of the basal cisterns. Fourth ventricle is midline. There is no hydrocephalus. Vascular: Right vertebral and bilateral carotid siphon calcifications. No unexpected calcifications are hyperdense vessels. Skull: Normal. Negative for fracture or focal lesion. Sinuses/Orbits:  Intact orbits and globes.  Mild membrane thickening of the ethmoid sinus. Other: Mastoids are clear. IMPRESSION: Chronic stable small vessel ischemic changes  of periventricular white matter. No acute intracranial abnormality. Electronically Signed   By: Onalee Hua  Sterling Big M.D.   On: 03/03/2017 19:24    Procedures Procedures (including critical care time)  Medications Ordered in ED Medications - No data to display   Initial Impression / Assessment and Plan / ED Course  I have reviewed the triage vital signs and the nursing notes.  Pertinent labs & imaging results that were available during my care of the patient were reviewed by me and considered in my medical decision making (see chart for details).     Patient with syncope while at dialysis.  Unclear etiology, but patient suspects it's because of sleep apnea.  Will check labs, CXR, EKG, and reassess.  Leukocytosis to 14.7.  H/H is baseline.  Platelets 54. K 3.0.  Dialyzed today.    Patient seen by and discussed with Dr. Rush Landmark.  Recommend observation for syncope x 2 this week and HD.  Etiology unclear.  Will interrogate AICD.    VSS currently, NAD.  Final Clinical Impressions(s) / ED Diagnoses   Final diagnoses:  Syncope, unspecified syncope type    New Prescriptions New Prescriptions   No medications on file     Roxy Horseman, PA-C 03/03/17 2217    Canary Brim Tegeler, MD 03/05/17 [phone]

## 2017-03-03 NOTE — ED Triage Notes (Signed)
Pt to ER sent from dialysis center for possible syncopal episode/possible seizure. Per EMS facility staff states patient began agonal breathing when she fell asleep. Patient states "it was my sleep apnea because they didn't put my oxygen on me." pt is to wear 2L O2 daily. Pt received 45 min dialysis today, VSS, NAD. Facility states patient BP in 80's, EMS reports 130/66 with CBG 87. Pt is a/o x4.

## 2017-03-03 NOTE — ED Notes (Signed)
Pt has [employer] ICD.  Per Dr. Julian Reil rep for [employer] may come in the am to interrogate.  Jeanice Lim is the rep, [phone].

## 2017-03-03 NOTE — ED Notes (Signed)
Pt now endorsing she may have "blacked out."

## 2017-03-03 NOTE — ED Notes (Signed)
Patient transported to X-ray 

## 2017-03-03 NOTE — H&P (Addendum)
History and Physical    Brandy Dudley MPN:[id number] DOB: Feb 14, 1951 DOA: 03/03/2017  PCP: [name], MD  Patient coming from: SNF  I have personally briefly reviewed patient's old medical records in [facility]  Chief Complaint: Syncope  HPI: Brandy Dudley is a 66 y.o. female with medical history significant of CHF with EF 30%, AICD / pacer present, DM2 causing ESRD with dialysis TTS.  Patient presents to ED after syncopal episode today at dialysis.  Had similar episode on [place] this week.  Syncope lasted 10 mins.  She attributes her symptoms to OSA and not being on oxygen.  She is also currently on PO vanc for C.Diff, and recently taken off of levaquin which had been used to treat acute cholecystitis.  She has had some abdominal pain since being treated for cholecystitis by T-Tube.  No other symptoms today.  ED Course: Work up in ED thus far unremarkable other than platelets of 54 which is new.   Review of Systems: As per HPI otherwise 10 point review of systems negative.   Past Medical History:  Diagnosis Date  . AICD (automatic cardioverter/defibrillator) present    Biotronik Inventra 7 VR-T DX 04/12/15  . Anemia   . Anxiety   . C. difficile colitis   . Cardiomyopathy (HCC)    02/02/16 ([facility]): Mixed ischemic and non-ischemic cardiomyopathy  . Charcot's joint of left foot   . CHF (congestive heart failure) (HCC)    systolic  . Cholelithiases 01/2017  . Complication of anesthesia 02/02/2017   has anxeity when awaken with gallbladder surgery  . COPD (chronic obstructive pulmonary disease) (HCC)   . Coronary artery disease   . Diabetes mellitus (HCC)    Type II  . DVT (deep venous thrombosis) (HCC)    on coumadin  . ESRD (end stage renal disease) (HCC)    tues/thurs/sat dialysis  . Hypertension   . Neuropathy (HCC)   . Obstructive sleep apnea    no cpap  . Renal disorder   . Wears glasses     Past Surgical History:  Procedure Laterality Date  . AV FISTULA  PLACEMENT Left 04/03/2016   Procedure: ARTERIOVENOUS (AV) FISTULA CREATION;  Surgeon: Larina Earthly, MD;  Location: [facility] OR;  Service: Vascular;  Laterality: Left;  . AV FISTULA PLACEMENT Right 08/21/2016   Procedure: INSERTION RIGHT ARM ARTERIOVENOUS GRAFT USING 4-7MM X45  CM ACUSEAL GRAFT;  Surgeon: Maeola Harman, MD;  Location: [facility] OR;  Service: Vascular;  Laterality: Right;  . AV FISTULA PLACEMENT Left 02/12/2017   Procedure: INSERTION OF ARTERIOVENOUS (AV) GORE-TEX GRAFT THIGH;  Surgeon: Maeola Harman, MD;  Location: [facility] OR;  Service: Vascular;  Laterality: Left;  Using Goretex 4-85mm Vascular Graft  . CORONARY ANGIOPLASTY     LCX stent 2010; by [facility] 01/2016 note: 03/2014 LHC/RHC: minor luminal irregularities LAD and RCA with patent stent first marginal branch of CX. RA 11, PAP 58/22 with mean 39, mean wedge pressure 19, LVEDP 27, PVR 3.9, CO 5.1, Cardiac index 2.5.   . FISTULA SUPERFICIALIZATION Left 06/16/2016   Procedure: SUPERFICIALIZATION OF LEFT ARM BRACHIOCEPHALIC ARTERIOVENOUS FISTULA;  Surgeon: Chuck Hint, MD;  Location: [facility] OR;  Service: Vascular;  Laterality: Left;  . INSERTION OF DIALYSIS CATHETER Left 04/03/2016   Procedure: INSERTION OF DIALYSIS CATHETER;  Surgeon: Larina Earthly, MD;  Location: [facility] OR;  Service: Vascular;  Laterality: Left;  . IR GENERIC HISTORICAL  02/02/2017   IR PERC CHOLECYSTOSTOMY 02/02/2017 Malachy Moan,  MD [facility]-INTERV RAD  . LIGATION OF COMPETING BRANCHES OF ARTERIOVENOUS FISTULA Left 06/16/2016   Procedure: LIGATION OF COMPETING BRANCHES OF left brachiocephalic ARTERIOVENOUS FISTULA;  Surgeon: Chuck Hint, MD;  Location: [facility] OR;  Service: Vascular;  Laterality: Left;  . OTHER SURGICAL HISTORY  02/02/2017   Transhepatic percutaneous cholecystostomy placement  . PERIPHERAL VASCULAR CATHETERIZATION N/A 07/20/2016   Procedure: A/V Shuntogram/Fistulagram;  Surgeon: Fransisco Hertz, MD;  Location: [facility] INVASIVE CV LAB;  Service:  Cardiovascular;  Laterality: N/A;  . PERIPHERAL VASCULAR CATHETERIZATION N/A 07/20/2016   Procedure: Dialysis/Perma Catheter Insertion;  Surgeon: Fransisco Hertz, MD;  Location: [facility] INVASIVE CV LAB;  Service: Cardiovascular;  Laterality: N/A;  . REVISION OF ARTERIOVENOUS GORETEX GRAFT Left 06/16/2016   Procedure: REVISION OF LEFT BRACHIOCEPHALIC ARTERIOVENOUS FISTULA WITH RESECTION OF REDUNDANT SECTION;  Surgeon: Chuck Hint, MD;  Location: [facility] OR;  Service: Vascular;  Laterality: Left;     reports that she quit smoking about 11 years ago. Her smoking use included Cigarettes. She has never used smokeless tobacco. She reports that she does not drink alcohol or use drugs.  Allergies  Allergen Reactions  . Entresto [Sacubitril-Valsartan] Other (See Comments)     unknown reaction  . Latex Itching  . Sulfa Antibiotics Itching    History reviewed. No pertinent family history.   Prior to Admission medications   Medication Sig Start Date End Date Taking? Authorizing Provider  acetaminophen (TYLENOL) 325 MG tablet Take 325 mg by mouth every 6 (six) hours as needed for mild pain or fever (For fever >99.5).    Yes Historical Provider, MD  albuterol (PROAIR HFA) 108 (90 Base) MCG/ACT inhaler Inhale 2 puffs into the lungs every 6 (six) hours as needed for wheezing or shortness of breath.    Yes Historical Provider, MD  amiodarone (PACERONE) 200 MG tablet Take 1 tablet (200 mg total) by mouth daily. 10/11/16  Yes Marinus Maw, MD  atorvastatin (LIPITOR) 10 MG tablet Take 10 mg by mouth at bedtime.    Yes Historical Provider, MD  famotidine (PEPCID) 20 MG tablet Take 20 mg by mouth at bedtime.   Yes Historical Provider, MD  fluticasone (FLOVENT HFA) 110 MCG/ACT inhaler Inhale 2 puffs into the lungs 2 (two) times daily.    Yes Historical Provider, MD  hydrochlorothiazide (MICROZIDE) 12.5 MG capsule Take 12.5 mg by mouth daily.   Yes Historical Provider, MD  hydrOXYzine (ATARAX/VISTARIL) 50 MG  tablet Take 50 mg by mouth every 6 (six) hours as needed for itching.   Yes Historical Provider, MD  insulin glargine (LANTUS) 100 UNIT/ML injection Inject 8 Units into the skin every morning.   Yes Historical Provider, MD  insulin lispro (HUMALOG) 100 UNIT/ML injection Inject 0-10 Units into the skin 3 (three) times daily as needed for high blood sugar (CBG >180). CBG 0-59 Hypoglycemic Protocol, 60-149 0 units 150-250 5 units, 251-300 8 units, 301-350 10 units, >350 call MD   Yes Historical Provider, MD  lidocaine (LIDODERM) 5 % Place 1 patch onto the skin daily. Remove & Discard patch within 12 hours or as directed by MD   Yes Historical Provider, MD  loperamide (IMODIUM) 2 MG capsule Take by mouth as needed for diarrhea or loose stools.   Yes Historical Provider, MD  metoprolol succinate (TOPROL-XL) 25 MG 24 hr tablet Take 25 mg by mouth daily.    Yes Historical Provider, MD  midodrine (PROAMATINE) 5 MG tablet Take 1 tablet (5 mg total) by mouth 3 (three)  times daily with meals. 02/23/17  Yes Penny Pia, MD  mirtazapine (REMERON) 15 MG tablet Take 15 mg by mouth at bedtime.   Yes Historical Provider, MD  Multiple Vitamins-Minerals (CERTAVITE/ANTIOXIDANTS) TABS Take 1 tablet by mouth at bedtime.   Yes Historical Provider, MD  nitroGLYCERIN (NITROSTAT) 0.4 MG SL tablet Place 0.4 mg under the tongue every 5 (five) minutes as needed for chest pain.   Yes Historical Provider, MD  oxyCODONE-acetaminophen (PERCOCET/ROXICET) 5-325 MG tablet Take 1-2 tablets by mouth every 6 (six) hours as needed for moderate pain or severe pain.   Yes Historical Provider, MD  OXYGEN Inhale 2 L into the lungs as needed.   Yes Historical Provider, MD  sertraline (ZOLOFT) 25 MG tablet Take 25 mg by mouth daily.    Yes Historical Provider, MD  Inulin (FIBER CHOICE PO) Take 2 tablets by mouth daily as needed (Constipation).     Historical Provider, MD  lactose free nutrition (BOOST PLUS) LIQD Take 237 mLs by mouth daily.      Historical Provider, MD  levofloxacin (LEVAQUIN) 500 MG tablet Take 500 mg by mouth daily.    Historical Provider, MD  Nutritional Supplements (ENSURE CLEAR) LIQD Take 1 each by mouth daily. For 14 days    Historical Provider, MD  Propylene Glycol (SYSTANE BALANCE) 0.6 % SOLN Place 1 drop into both eyes 2 (two) times daily.    Historical Provider, MD  tiotropium (SPIRIVA) 18 MCG inhalation capsule Place 1 capsule (18 mcg total) into inhaler and inhale daily. 02/24/17   Penny Pia, MD  vancomycin (VANCOCIN) 50 mg/mL oral solution Take 2.5 mLs (125 mg total) by mouth 4 (four) times daily. 02/23/17 03/07/17  Penny Pia, MD    Physical Exam: Vitals:   03/03/17 1753 03/03/17 1800 03/03/17 1830 03/03/17 1900  BP:  117/65 (!) 127/58 (!) 117/59  Pulse: (!) 55 (!) 58 (!) 51 (!) 55  Resp: 16 14 15 19   Temp:      TempSrc:      SpO2: 100% 100% 100% 100%    Constitutional: NAD, calm, comfortable Eyes: PERRL, lids and conjunctivae normal ENMT: Mucous membranes are moist. Posterior pharynx clear of any exudate or lesions.Normal dentition.  Neck: normal, supple, no masses, no thyromegaly Respiratory: clear to auscultation bilaterally, no wheezing, no crackles. Normal respiratory effort. No accessory muscle use.  Cardiovascular: Regular rate and rhythm, no murmurs / rubs / gallops. No extremity edema. 2+ pedal pulses. No carotid bruits.  Abdomen: no tenderness, no masses palpated. No hepatosplenomegaly. Bowel sounds positive.  Musculoskeletal: no clubbing / cyanosis. No joint deformity upper and lower extremities. Good ROM, no contractures. Normal muscle tone.  Skin: no rashes, lesions, ulcers. No induration Neurologic: CN 2-12 grossly intact. Sensation intact, DTR normal. Strength 5/5 in all 4.  Psychiatric: Normal judgment and insight. Alert and oriented x 3. Normal mood.    Labs on Admission: I have personally reviewed following labs and imaging studies  CBC:  Recent Labs Lab 03/03/17 1700    WBC 14.7*  HGB 9.3*  HCT 31.3*  MCV 83.2  PLT 54*   Basic Metabolic Panel:  Recent Labs Lab 03/03/17 1700  NA 139  K 3.0*  CL 100*  CO2 25  GLUCOSE 73  BUN 22*  CREATININE 4.26*  CALCIUM 8.4*   GFR: Estimated Creatinine Clearance: 13 mL/min (A) (by C-G formula based on SCr of 4.26 mg/dL (H)). Liver Function Tests: No results for input(s): AST, ALT, ALKPHOS, BILITOT, PROT, ALBUMIN in the last  168 hours. No results for input(s): LIPASE, AMYLASE in the last 168 hours. No results for input(s): AMMONIA in the last 168 hours. Coagulation Profile: No results for input(s): INR, PROTIME in the last 168 hours. Cardiac Enzymes: No results for input(s): CKTOTAL, CKMB, CKMBINDEX, TROPONINI in the last 168 hours. BNP (last 3 results) No results for input(s): PROBNP in the last 8760 hours. HbA1C: No results for input(s): HGBA1C in the last 72 hours. CBG:  Recent Labs Lab 03/03/17 1658  GLUCAP 69   Lipid Profile: No results for input(s): CHOL, HDL, LDLCALC, TRIG, CHOLHDL, LDLDIRECT in the last 72 hours. Thyroid Function Tests: No results for input(s): TSH, T4TOTAL, FREET4, T3FREE, THYROIDAB in the last 72 hours. Anemia Panel: No results for input(s): VITAMINB12, FOLATE, FERRITIN, TIBC, IRON, RETICCTPCT in the last 72 hours. Urine analysis:    Component Value Date/Time   COLORURINE AMBER (A) 07/15/2016 1636   APPEARANCEUR TURBID (A) 07/15/2016 1636   LABSPEC 1.025 07/15/2016 1636   PHURINE 5.0 07/15/2016 1636   GLUCOSEU NEGATIVE 07/15/2016 1636   HGBUR MODERATE (A) 07/15/2016 1636   BILIRUBINUR MODERATE (A) 07/15/2016 1636   KETONESUR 15 (A) 07/15/2016 1636   PROTEINUR 100 (A) 07/15/2016 1636   NITRITE NEGATIVE 07/15/2016 1636   LEUKOCYTESUR MODERATE (A) 07/15/2016 1636    Radiological Exams on Admission: Dg Chest 2 View  Result Date: 03/03/2017 CLINICAL DATA:  Status post syncope during dialysis. Acute onset of left-sided and central chest pain. Initial encounter.  EXAM: CHEST  2 VIEW COMPARISON:  Chest radiograph performed 02/19/2017 FINDINGS: The lungs are hypoexpanded. No pleural effusion or pneumothorax is seen. Minimal bilateral atelectasis is noted. The cardiomediastinal silhouette is mildly enlarged. A right-sided dual-lumen catheter is noted ending about the proximal right atrium. An AICD is noted at the left chest wall, with a single lead ending at the right ventricle. No acute osseous abnormalities are identified. IMPRESSION: Lungs hypoexpanded. Minimal bilateral atelectasis seen. Mild cardiomegaly. Electronically Signed   By: Roanna Raider M.D.   On: 03/03/2017 17:51   Ct Head Wo Contrast  Result Date: 03/03/2017 CLINICAL DATA:  Syncope and seizure while at dialysis today EXAM: CT HEAD WITHOUT CONTRAST TECHNIQUE: Contiguous axial images were obtained from the base of the skull through the vertex without intravenous contrast. COMPARISON:  02/19/2017 FINDINGS: Brain: Age-appropriate involutional changes. No acute large vascular territory infarction, hemorrhage nor midline shift. No intra-axial mass nor extra-axial fluid. Minimal small vessel ischemic changes of periventricular white matter. No effacement of the basal cisterns. Fourth ventricle is midline. There is no hydrocephalus. Vascular: Right vertebral and bilateral carotid siphon calcifications. No unexpected calcifications are hyperdense vessels. Skull: Normal. Negative for fracture or focal lesion. Sinuses/Orbits:  Intact orbits and globes.  Mild membrane thickening of the ethmoid sinus. Other: Mastoids are clear. IMPRESSION: Chronic stable small vessel ischemic changes of periventricular white matter. No acute intracranial abnormality. Electronically Signed   By: Tollie Eth M.D.   On: 03/03/2017 19:24    EKG: Independently reviewed.  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 Syncope - Will admit for overnight obs 1. Tele monitor 2. 2d echo 3. Pacer company coming in AM to interrogate pacemaker 4. Continue midodrine 5. If BP getting low during dialysis, may need to increase midodrine dose 2. DM2 - continue current  outpatient dosing of lantus and novolog 3. ESRD - last dialysis today, anticipate discharge tomorrow 4. Thrombocytopenia - 1. New issue, unclear cause 1. No AMS at this point to make me think TTP 2. Patient resting comfortably in bed without complaints and normal vitals, not at all ill appearing as I would expect with DIC. 2. Repeat platelets with AM CBC  DVT prophylaxis: Heparin Animas Code Status: Full Family Communication: No family in room Disposition Plan: Back to SNF, likely tomorrow Consults called: None Admission status: Place in obs   Loralei Radcliffe, Heywood Iles. DO [facility] Pager [phone]  If 7AM-7PM, please contact day team taking care of patient [email or web address] Password [email or web address]  03/03/2017, 10:07 PM

## 2017-03-04 ENCOUNTER — Encounter (HOSPITAL_COMMUNITY): Payer: Self-pay | Admitting: General Practice

## 2017-03-04 DIAGNOSIS — R55 Syncope and collapse: Secondary | ICD-10-CM | POA: Diagnosis not present

## 2017-03-04 DIAGNOSIS — Z992 Dependence on renal dialysis: Secondary | ICD-10-CM | POA: Diagnosis not present

## 2017-03-04 DIAGNOSIS — Z95810 Presence of automatic (implantable) cardiac defibrillator: Secondary | ICD-10-CM

## 2017-03-04 DIAGNOSIS — E1122 Type 2 diabetes mellitus with diabetic chronic kidney disease: Secondary | ICD-10-CM | POA: Diagnosis not present

## 2017-03-04 DIAGNOSIS — N186 End stage renal disease: Secondary | ICD-10-CM | POA: Diagnosis not present

## 2017-03-04 DIAGNOSIS — D696 Thrombocytopenia, unspecified: Secondary | ICD-10-CM

## 2017-03-04 DIAGNOSIS — I255 Ischemic cardiomyopathy: Secondary | ICD-10-CM

## 2017-03-04 LAB — BASIC METABOLIC PANEL
ANION GAP: 12 (ref 5–15)
BUN: 26 mg/dL — ABNORMAL HIGH (ref 6–20)
CALCIUM: 8.4 mg/dL — AB (ref 8.9–10.3)
CO2: 29 mmol/L (ref 22–32)
CREATININE: 4.9 mg/dL — AB (ref 0.44–1.00)
Chloride: 97 mmol/L — ABNORMAL LOW (ref 101–111)
GFR calc non Af Amer: 8 mL/min — ABNORMAL LOW (ref >60)
GFR, EST AFRICAN AMERICAN: 10 mL/min — AB (ref >60)
Glucose, Bld: 82 mg/dL (ref 65–99)
Potassium: 3.4 mmol/L — ABNORMAL LOW (ref 3.5–5.1)
SODIUM: 138 mmol/L (ref 135–145)

## 2017-03-04 LAB — CBC
HEMATOCRIT: 29.8 % — AB (ref 36.0–46.0)
HEMOGLOBIN: 8.7 g/dL — AB (ref 12.0–15.0)
MCH: 24.6 pg — ABNORMAL LOW (ref 26.0–34.0)
MCHC: 29.2 g/dL — AB (ref 30.0–36.0)
MCV: 84.4 fL (ref 78.0–100.0)
Platelets: 74 10*3/uL — ABNORMAL LOW (ref 150–400)
RBC: 3.53 MIL/uL — ABNORMAL LOW (ref 3.87–5.11)
RDW: 20.4 % — AB (ref 11.5–15.5)
WBC: 10.6 10*3/uL — AB (ref 4.0–10.5)

## 2017-03-04 LAB — DIFFERENTIAL
BASOS ABS: 0.1 10*3/uL (ref 0.0–0.1)
Basophils Relative: 1 %
EOS ABS: 0 10*3/uL (ref 0.0–0.7)
Eosinophils Relative: 2 %
LYMPHS PCT: 17 %
Lymphs Abs: 2.5 10*3/uL (ref 0.7–4.0)
MONOS PCT: 10 %
Monocytes Absolute: 0.7 10*3/uL (ref 0.1–1.0)
NEUTROS PCT: 70 %
Neutro Abs: 8 10*3/uL — ABNORMAL HIGH (ref 1.7–7.7)

## 2017-03-04 LAB — GLUCOSE, CAPILLARY
GLUCOSE-CAPILLARY: 128 mg/dL — AB (ref 65–99)
Glucose-Capillary: 60 mg/dL — ABNORMAL LOW (ref 65–99)
Glucose-Capillary: 115 mg/dL — ABNORMAL HIGH (ref 65–99)
Glucose-Capillary: 132 mg/dL — ABNORMAL HIGH (ref 65–99)
Glucose-Capillary: 131 mg/dL — ABNORMAL HIGH (ref 65–99)

## 2017-03-04 LAB — PROTIME-INR
INR: 1.29
Prothrombin Time: 16.2 seconds — ABNORMAL HIGH (ref 11.4–15.2)

## 2017-03-04 LAB — MRSA PCR SCREENING: MRSA by PCR: NEGATIVE

## 2017-03-04 MED ORDER — VANCOMYCIN 50 MG/ML ORAL SOLUTION
125.0000 mg | Freq: Four times a day (QID) | ORAL | Status: DC
  Administered 2017-03-04 – 2017-03-05 (×8): 125 mg via ORAL
  Filled 2017-03-04 (×10): qty 2.5

## 2017-03-04 MED ORDER — INSULIN ASPART 100 UNIT/ML ~~LOC~~ SOLN: 0.0000 [IU] | Freq: Three times a day (TID) | SUBCUTANEOUS | Status: DC

## 2017-03-04 MED ORDER — MIDODRINE HCL 5 MG PO TABS
10.0000 mg | ORAL_TABLET | Freq: Three times a day (TID) | ORAL | Status: DC
  Administered 2017-03-04 – 2017-03-05 (×5): 10 mg via ORAL
  Filled 2017-03-04 (×5): qty 2

## 2017-03-04 NOTE — Progress Notes (Signed)
CRITICAL VALUE ALERT  Critical value received: CBG:60  Date of notification: 03/04/17  Time of notification: 0745  Critical value read back: Yes  Nurse who received alert: Tyera Hansley Mabe   Patient shows/reports no signs of hypoglycemia. Patient ate breakfast and drink grape juice. CBG re-checked. Now 128. Will continue to monitor.

## 2017-03-04 NOTE — Progress Notes (Addendum)
PROGRESS NOTE  Brandy Dudley  ZOX:[id number] DOB: [birth year] DOA: 03/03/2017 PCP: [name], MD  Brief Narrative:   Brandy Dudley is a 66 y.o. female with medical history significant of CHF with EF 30%, AICD / pacer present, DM2 causing ESRD with dialysis TTS.  Patient presents to ED after syncopal episode today at dialysis.  Had similar episode on [place] this week.  Syncope lasted 10-mins.  She attributes her symptoms to OSA and not being on oxygen.  She is also currently on PO vanc for C.Diff, and recently taken off of levaquin which had been used to treat acute cholecystitis.  She has had some abdominal pain since being treated for cholecystitis by T-Tube.  No other symptoms today.    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  Other unusual finding was thrombocytopenia which is new and unclear how this may be related to syncope as her head CT was normal.   -  Pacemaker was interrogated and functioning normally -  D/c metoprolol (discussed with Dr. Delton See, Cardiology) -  Increase midodrine  (discussed with Dr. Delton See, Cardiology) -  Repeat ECHO pending -  Continue trial of very low dose florinef if the above changes are ineffective  PAF and VT s/p AICD -  Continue amiodarone -  Stopping metoprolol  -  Not a good candidate for systemic anticoagualtion  Chronic systolic heart failure, class 2 -  D/c metoprolol -  Continue volume management with HD -  f/u ECHO  Diabetes mellitus type 2 with hypoglycemia, last A1c 5 -  D/c insulin  ESRD -  Will discuss if patient could try dialysis as inpatient Monday  or Tuesday to monitor for post-HD syncope  Thrombocytopenia   -  Differential normal -  No schistocytes or abnormal morphology on smear -  Trending up spontaneously -  eval for bacteremia:  Blood cultures x 2  Anxiety and depression -  Continue zoloft and mirtazapine  Cholecystitis s/p cholecystostomy drain.  Fluid appears clear  C. Diff  -  Continue oral vancomycin  Hx of DVT, but no longer on anticoagulation as deemed high risk  COPD, on prn home oxygen  DVT prophylaxis:  SCDs Code Status:  full Family Communication:  Patient alone Disposition Plan:  Possibly home tomorrow.  Awaiting ECHO and platelet count to trend up   Consultants:   Spoke by phone with Cardiology  Procedures:  Pacemaker interrogation  Antimicrobials:  Anti-infectives    Start     Dose/Rate Route Frequency Ordered Stop   03/04/17 1000  levofloxacin (LEVAQUIN) tablet 500 mg  Status:  Discontinued     500 mg Oral Daily 03/03/17 2226 03/03/17 2227   03/04/17 0015  vancomycin (VANCOCIN) 50 mg/mL oral solution 125 mg     125 mg Oral Every 6 hours 03/04/17 0010 03/07/17 2359   03/04/17 0000  vancomycin (VANCOCIN) 50 mg/mL oral solution 125 mg  Status:  Discontinued     125 mg Oral 4 times daily 03/03/17 2214 03/04/17 0009       Subjective:  Feeling better today.  Yesterday, she felt hot, flushed, and nauseated and then passed out  after hemodialysis.  After she awoke, she had a headache that lasted for about an hour that spontaneously resolved.    Objective: Vitals:   03/03/17 2330 03/04/17 0505 03/04/17 0953 03/04/17 1004  BP: (!) 110/49 (!) 116/49  (!) 114/45  Pulse: (!) 57 (!) 58  66  Resp: 16 16  18   Temp: 98.7 F (37.1 C) 98.3 F (36.8 C)  98.2 F (36.8 C)  TempSrc: Oral Oral  Oral  SpO2: 100% 100% 100% 100%  Weight: 63.1 kg (139 lb 3.2 oz)       Intake/Output Summary (Last 24 hours) at 03/04/17 1637 Last data filed at 03/04/17 1056  Gross per 24 hour  Intake              483 ml    Output              525 ml  Net              -42 ml   Filed Weights   03/03/17 2330  Weight: 63.1 kg (139 lb 3.2 oz)    Examination:  General exam:  Adult female.  No acute distress.  2L M[place] in place HEENT:  NCAT, MMM Respiratory system: Clear to auscultation bilaterally Cardiovascular system: IRRR, normal S1/S2. 2/6 systolic murmur, possible gallop vs. Murmur, no rubs.  Warm extremities Gastrointestinal system: Normal active bowel sounds, soft, nondistended, nontender. MSK:  Normal tone and bulk, no lower extremity edema Neuro:  Grossly intact    Data Reviewed: I have personally reviewed following labs and imaging studies  CBC:  Recent Labs Lab 03/03/17 1700 03/04/17 0348 03/04/17 0851  WBC 14.7* 10.6*  --   NEUTROABS  --   --  PENDING  HGB 9.3* 8.7*  --   HCT 31.3* 29.8*  --   MCV 83.2 84.4  --   PLT 54* 74*  --    Basic Metabolic Panel:  Recent Labs Lab 03/03/17 1700 03/04/17 0348  NA 139 138  K 3.0* 3.4*  CL 100* 97*  CO2 25 29  GLUCOSE 73 82  BUN 22* 26*  CREATININE 4.26* 4.90*  CALCIUM 8.4* 8.4*   GFR: Estimated Creatinine Clearance: 11.2 mL/min (A) (by C-G formula based on SCr of 4.9 mg/dL (H)). Liver Function Tests: No results for input(s): AST, ALT, ALKPHOS, BILITOT, PROT, ALBUMIN in the last 168 hours. No results for input(s): LIPASE, AMYLASE in the last 168 hours. No results for input(s): AMMONIA in the last 168 hours. Coagulation Profile:  Recent Labs Lab 03/04/17 0851  INR 1.29   Cardiac Enzymes: No results for input(s): CKTOTAL, CKMB, CKMBINDEX, TROPONINI in the last 168 hours. BNP (last 3 results) No results for input(s): PROBNP in the last 8760 hours. HbA1C: No results for input(s): HGBA1C in the last 72 hours. CBG:  Recent Labs Lab 03/03/17 1658 03/04/17 0739 03/04/17 1003 03/04/17 1225  GLUCAP 69 60* 128* 115*   Lipid Profile: No results for input(s): CHOL, HDL, LDLCALC, TRIG, CHOLHDL, LDLDIRECT in the last 72  hours. Thyroid Function Tests: No results for input(s): TSH, T4TOTAL, FREET4, T3FREE, THYROIDAB in the last 72 hours. Anemia Panel: No results for input(s): VITAMINB12, FOLATE, FERRITIN, TIBC, IRON, RETICCTPCT in the last 72 hours. Urine analysis:    Component Value Date/Time   COLORURINE AMBER (A) 07/15/2016 1636   APPEARANCEUR TURBID (A) 07/15/2016 1636   LABSPEC 1.025 07/15/2016 1636   PHURINE 5.0 07/15/2016 1636   GLUCOSEU NEGATIVE 07/15/2016 1636   HGBUR MODERATE (A) 07/15/2016  1636   BILIRUBINUR MODERATE (A) 07/15/2016 1636   KETONESUR 15 (A) 07/15/2016 1636   PROTEINUR 100 (A) 07/15/2016 1636   NITRITE NEGATIVE 07/15/2016 1636   LEUKOCYTESUR MODERATE (A) 07/15/2016 1636   Sepsis Labs: @LABRCNTIP (procalcitonin:4,lacticidven:4)  ) Recent Results (from the past 240 hour(s))  Culture, blood (Routine X 2) w Reflex to ID Panel     Status: None (Preliminary result)   Collection Time: 03/04/17  8:40 AM  Result Value Ref Range Status   Specimen Description BLOOD RIGHT HAND  Final   Special Requests IN PEDIATRIC BOTTLE Blood Culture adequate volume  Final   Culture NO GROWTH < 12 HOURS  Final   Report Status PENDING  Incomplete  Culture, blood (Routine X 2) w Reflex to ID Panel     Status: None (Preliminary result)   Collection Time: 03/04/17  8:55 AM  Result Value Ref Range Status   Specimen Description BLOOD RIGHT HAND  Final   Special Requests IN PEDIATRIC BOTTLE Blood Culture adequate volume  Final   Culture NO GROWTH < 12 HOURS  Final   Report Status PENDING  Incomplete  MRSA PCR Screening     Status: None   Collection Time: 03/04/17 12:59 PM  Result Value Ref Range Status   MRSA by PCR NEGATIVE NEGATIVE Final    Comment:        The GeneXpert MRSA Assay (FDA approved for NASAL specimens only), is one component of a comprehensive MRSA colonization surveillance program. It is not intended to diagnose MRSA infection nor to guide or monitor treatment for MRSA  infections.       Radiology Studies: Dg Chest 2 View  Result Date: 03/03/2017 CLINICAL DATA:  Status post syncope during dialysis. Acute onset of left-sided and central chest pain. Initial encounter. EXAM: CHEST  2 VIEW COMPARISON:  Chest radiograph performed 02/19/2017 FINDINGS: The lungs are hypoexpanded. No pleural effusion or pneumothorax is seen. Minimal bilateral atelectasis is noted. The cardiomediastinal silhouette is mildly enlarged. A right-sided dual-lumen catheter is noted ending about the proximal right atrium. An AICD is noted at the left chest wall, with a single lead ending at the right ventricle. No acute osseous abnormalities are identified. IMPRESSION: Lungs hypoexpanded. Minimal bilateral atelectasis seen. Mild cardiomegaly. Electronically Signed   By: Roanna Raider M.D.   On: 03/03/2017 17:51   Ct Head Wo Contrast  Result Date: 03/03/2017 CLINICAL DATA:  Syncope and seizure while at dialysis today EXAM: CT HEAD WITHOUT CONTRAST TECHNIQUE: Contiguous axial images were obtained from the base of the skull through the vertex without intravenous contrast. COMPARISON:  02/19/2017 FINDINGS: Brain: Age-appropriate involutional changes. No acute large vascular territory infarction, hemorrhage nor midline shift. No intra-axial mass nor extra-axial fluid. Minimal small vessel ischemic changes of periventricular white matter. No effacement of the basal cisterns. Fourth ventricle is midline. There is no hydrocephalus. Vascular: Right vertebral and bilateral carotid siphon calcifications. No unexpected calcifications are hyperdense vessels. Skull: Normal. Negative for fracture or focal lesion. Sinuses/Orbits:  Intact orbits and globes.  Mild membrane thickening of the ethmoid sinus. Other: Mastoids are clear. IMPRESSION: Chronic stable small vessel ischemic changes of periventricular white matter. No acute intracranial abnormality. Electronically Signed   By: Tollie Eth M.D.   On: 03/03/2017 19:24      Scheduled Meds: . amiodarone  200 mg Oral Daily  . atorvastatin  10 mg Oral QHS  . budesonide (PULMICORT) nebulizer solution  1 mg Nebulization BID  . famotidine  20 mg Oral QHS  .  lactose free nutrition  237 mL Oral Daily  . midodrine  10 mg Oral TID WC  . mirtazapine  15 mg Oral QHS  . multivitamin with minerals  1 tablet Oral QHS  . polyvinyl alcohol  1 drop Both Eyes BID  . sertraline  25 mg Oral Daily  . sodium chloride flush  3 mL Intravenous Q12H  . tiotropium  18 mcg Inhalation Daily  . vancomycin  125 mg Oral Q6H   Continuous Infusions:   LOS: 0 days    Time spent: 30 min    Renae Fickle, MD [facility] Pager [phone]  If 7PM-7AM, please contact night-coverage [email or web address] Password [facility] 03/04/2017, 4:37 PM

## 2017-03-05 ENCOUNTER — Other Ambulatory Visit (HOSPITAL_COMMUNITY): Payer: Medicare PPO

## 2017-03-05 DIAGNOSIS — I953 Hypotension of hemodialysis: Secondary | ICD-10-CM | POA: Diagnosis not present

## 2017-03-05 DIAGNOSIS — I255 Ischemic cardiomyopathy: Secondary | ICD-10-CM | POA: Diagnosis present

## 2017-03-05 DIAGNOSIS — Z95810 Presence of automatic (implantable) cardiac defibrillator: Secondary | ICD-10-CM | POA: Diagnosis not present

## 2017-03-05 DIAGNOSIS — I472 Ventricular tachycardia: Secondary | ICD-10-CM | POA: Diagnosis not present

## 2017-03-05 DIAGNOSIS — D696 Thrombocytopenia, unspecified: Secondary | ICD-10-CM | POA: Diagnosis not present

## 2017-03-05 DIAGNOSIS — Z9861 Coronary angioplasty status: Secondary | ICD-10-CM | POA: Diagnosis not present

## 2017-03-05 DIAGNOSIS — I2510 Atherosclerotic heart disease of native coronary artery without angina pectoris: Secondary | ICD-10-CM | POA: Diagnosis present

## 2017-03-05 DIAGNOSIS — I5022 Chronic systolic (congestive) heart failure: Secondary | ICD-10-CM | POA: Diagnosis present

## 2017-03-05 DIAGNOSIS — J449 Chronic obstructive pulmonary disease, unspecified: Secondary | ICD-10-CM | POA: Diagnosis not present

## 2017-03-05 DIAGNOSIS — N186 End stage renal disease: Secondary | ICD-10-CM | POA: Diagnosis present

## 2017-03-05 DIAGNOSIS — Z79899 Other long term (current) drug therapy: Secondary | ICD-10-CM | POA: Diagnosis not present

## 2017-03-05 DIAGNOSIS — I480 Paroxysmal atrial fibrillation: Secondary | ICD-10-CM | POA: Diagnosis not present

## 2017-03-05 DIAGNOSIS — I132 Hypertensive heart and chronic kidney disease with heart failure and with stage 5 chronic kidney disease, or end stage renal disease: Secondary | ICD-10-CM | POA: Diagnosis present

## 2017-03-05 DIAGNOSIS — Z992 Dependence on renal dialysis: Secondary | ICD-10-CM | POA: Diagnosis not present

## 2017-03-05 DIAGNOSIS — Z7951 Long term (current) use of inhaled steroids: Secondary | ICD-10-CM | POA: Diagnosis not present

## 2017-03-05 DIAGNOSIS — Z794 Long term (current) use of insulin: Secondary | ICD-10-CM | POA: Diagnosis not present

## 2017-03-05 DIAGNOSIS — R55 Syncope and collapse: Secondary | ICD-10-CM | POA: Diagnosis present

## 2017-03-05 DIAGNOSIS — Z79891 Long term (current) use of opiate analgesic: Secondary | ICD-10-CM | POA: Diagnosis not present

## 2017-03-05 DIAGNOSIS — D631 Anemia in chronic kidney disease: Secondary | ICD-10-CM | POA: Diagnosis not present

## 2017-03-05 DIAGNOSIS — E1122 Type 2 diabetes mellitus with diabetic chronic kidney disease: Secondary | ICD-10-CM | POA: Diagnosis present

## 2017-03-05 LAB — CBC
HCT: 33.6 % — ABNORMAL LOW (ref 36.0–46.0)
Hemoglobin: 9.9 g/dL — ABNORMAL LOW (ref 12.0–15.0)
MCH: 24.5 pg — ABNORMAL LOW (ref 26.0–34.0)
MCHC: 29.5 g/dL — ABNORMAL LOW (ref 30.0–36.0)
MCV: 83.2 fL (ref 78.0–100.0)
Platelets: 113 10*3/uL — ABNORMAL LOW (ref 150–400)
RBC: 4.04 MIL/uL (ref 3.87–5.11)
RDW: 20.2 % — ABNORMAL HIGH (ref 11.5–15.5)
WBC: 9.1 10*3/uL (ref 4.0–10.5)

## 2017-03-05 LAB — RENAL FUNCTION PANEL
ALBUMIN: 2.4 g/dL — AB (ref 3.5–5.0)
Anion gap: 18 — ABNORMAL HIGH (ref 5–15)
BUN: 39 mg/dL — ABNORMAL HIGH (ref 6–20)
CALCIUM: 8.6 mg/dL — AB (ref 8.9–10.3)
CHLORIDE: 95 mmol/L — AB (ref 101–111)
CO2: 24 mmol/L (ref 22–32)
Creatinine, Ser: 6.22 mg/dL — ABNORMAL HIGH (ref 0.44–1.00)
GFR, EST AFRICAN AMERICAN: 7 mL/min — AB (ref >60)
GFR, EST NON AFRICAN AMERICAN: 6 mL/min — AB (ref >60)
Glucose, Bld: 115 mg/dL — ABNORMAL HIGH (ref 65–99)
PHOSPHORUS: 6.3 mg/dL — AB (ref 2.5–4.6)
Potassium: 4.2 mmol/L (ref 3.5–5.1)
SODIUM: 137 mmol/L (ref 135–145)

## 2017-03-05 LAB — GLUCOSE, CAPILLARY
GLUCOSE-CAPILLARY: 104 mg/dL — AB (ref 65–99)
Glucose-Capillary: 109 mg/dL — ABNORMAL HIGH (ref 65–99)
Glucose-Capillary: 102 mg/dL — ABNORMAL HIGH (ref 65–99)

## 2017-03-05 LAB — HAPTOGLOBIN: Haptoglobin: 218 mg/dL — ABNORMAL HIGH (ref 34–200)

## 2017-03-05 MED ORDER — MIDODRINE HCL 10 MG PO TABS: 10.0000 mg | ORAL_TABLET | Freq: Three times a day (TID) | ORAL | 0 refills | Status: DC

## 2017-03-05 MED ORDER — OXYCODONE-ACETAMINOPHEN 5-325 MG PO TABS: 1.0000 | ORAL_TABLET | Freq: Four times a day (QID) | ORAL | 0 refills | Status: DC | PRN

## 2017-03-05 NOTE — Progress Notes (Signed)
Patient resides at [facility] long term care and will return at discharge.  Osborne Casco Aerabella Galasso LCSWA [phone]

## 2017-03-05 NOTE — Progress Notes (Signed)
Patient will DC to: [name] Anticipated DC date: 03/05/17 Family notified: N/A Transport by: Sharin Mons   Per MD patient ready for DC to [facility]. RN, patient, patient's family, and facility notified of DC. Discharge Summary sent to facility. RN given number for report. DC packet on chart. Ambulance transport requested for patient.   CSW signing off.  Cristobal Goldmann, [place] Clinical Social Worker [phone]

## 2017-03-05 NOTE — Progress Notes (Signed)
Attempted report 3 times to [employer] at [facility].  Patient discharge summary faxed to facility. Patient prepared for [facility] to take patient back to faciltiy. Patient Alert and Oriented times 4. All belongings with patient.  Avelina Laine RN

## 2017-03-05 NOTE — Discharge Summary (Addendum)
Physician Discharge Summary  Ivyrose Hashman ZOX:[id number] DOB: 1951/03/07 DOA: 03/03/2017  PCP: [name], MD  Admit date: 03/03/2017 Discharge date: 03/05/2017  Admitted From: SNF Disposition:  SNF  Recommendations for Outpatient Follow-up:  1. Follow up with Cardiology ASAP 2. ECHO as outpatient 3. Stopped metoprolol and increased midodrine 4. Nephrology to adjust dry weight at next HD session  5. Repeat CBC to follow up thrombocytopenia within 1 week  Home Health:  PT/OT  Equipment/Devices:  None  Discharge Condition:  Stable, improved CODE STATUS:  Full code  Diet recommendation:  renal   Brief/Interim Summary:  Paizley Ramella a 66 y.o.femalewith medical history significant of CHF with EF 30%, AICD / pacer present, DM2 causing ESRD with dialysis TTS. Patient presents to ED after syncopal episode today at dialysis. Had similar episode on [place] this week. Syncope lasted 10-mins. She attributes her symptoms to OSA and not being on oxygen. She is also currently on PO vanc for C.Diff, and recently taken off of levaquin which had been used to treat acute cholecystitis. She has had some abdominal pain since being treated for cholecystitis by T-Tube. No other symptoms today.    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  Other unusual finding was thrombocytopenia which is new and unclear how this may be related to syncope as her head CT was normal.   -  Pacemaker was interrogated and functioning normally -  D/c metoprolol (discussed with Dr. Delton See, Cardiology) -  Increase midodrine  (discussed with  Dr. Delton See, Cardiology) -  Patient elected to have outpatient ECHO since exam has not been completed in 2 days -  consider trial of very low dose florinef if the above changes are ineffective  PAF and VT s/p AICD -  Continue amiodarone -  Stopped metoprolol  -  Not a good candidate for systemic anticoagualtion  Chronic systolic heart failure, class 2 -  D/c'd metoprolol -  Continue volume management with HD -   Close outpatient follow up with cardiology for ECHO  Diabetes mellitus type 2 with hypoglycemia, last A1c 5 -  D/c'd insulin  ESRD -  Nephrology to adjust dry weight at next outpatient HD session  Thrombocytopenia, acute phase reactant.  Increased from 54 to 113 over last two days -  Differential normal -  No schistocytes or abnormal morphology on smear -  Trending up spontaneously -  Blood cultures x 2 NGTD and no obvious signs of new infection (still on treatment for c. Diff)  Anxiety and depression -  Continue zoloft and mirtazapine  Cholecystitis s/p cholecystostomy drain.  Fluid appears clear  C. Diff with ongoing loose stools.  Supposed to have finished course on 4/7 but would continue until stools are more formed.   -  Continue oral vancomycin   Hx of DVT, but no longer on anticoagulation as deemed high risk  COPD, on prn home oxygen  Discharge Instructions  Discharge Instructions    Increase activity slowly    Complete by:  As directed        Medication List    STOP taking these medications   insulin glargine 100 UNIT/ML injection  Commonly known as:  LANTUS   metoprolol succinate 25 MG 24 hr tablet Commonly known as:  TOPROL-XL     TAKE these medications   acetaminophen 325 MG tablet Commonly known as:  TYLENOL Take 325 mg by mouth every 6 (six) hours as needed for mild pain or fever (For fever >99.5).   amiodarone 200 MG tablet Commonly known as:  PACERONE Take 1 tablet (200 mg total) by mouth daily.   atorvastatin 10 MG  tablet Commonly known as:  LIPITOR Take 10 mg by mouth at bedtime.   CERTAVITE/ANTIOXIDANTS Tabs Take 1 tablet by mouth at bedtime.   famotidine 20 MG tablet Commonly known as:  PEPCID Take 20 mg by mouth at bedtime.   FIBER CHOICE PO Take 2 tablets by mouth daily as needed (Constipation).   fluticasone 110 MCG/ACT inhaler Commonly known as:  FLOVENT HFA Inhale 2 puffs into the lungs 2 (two) times daily.   hydrOXYzine 50 MG tablet Commonly known as:  ATARAX/VISTARIL Take 50 mg by mouth every 6 (six) hours as needed for itching.   insulin lispro 100 UNIT/ML injection Commonly known as:  HUMALOG Inject 0-10 Units into the skin 3 (three) times daily as needed for high blood sugar (CBG >180). CBG 0-59 Hypoglycemic Protocol, 60-149 0 units 150-250 5 units, 251-300 8 units, 301-350 10 units, >350 call MD   lactose free nutrition Liqd Take 237 mLs by mouth daily.   ENSURE CLEAR Liqd Take 1 each by mouth daily.   midodrine 10 MG tablet Commonly known as:  PROAMATINE Take 1 tablet (10 mg total) by mouth 3 (three) times daily with meals. What changed:  medication strength  how much to take   mirtazapine 15 MG tablet Commonly known as:  REMERON Take 15 mg by mouth at bedtime.   nitroGLYCERIN 0.4 MG SL tablet Commonly known as:  NITROSTAT Place 0.4 mg under the tongue every 5 (five) minutes as needed for chest pain.   oxyCODONE-acetaminophen 5-325 MG tablet Commonly known as:  PERCOCET/ROXICET Take 1-2 tablets by mouth every 6 (six) hours as needed for moderate pain or severe pain.   OXYGEN Inhale 2 L into the lungs as needed.   PROAIR HFA 108 (90 Base) MCG/ACT inhaler Generic drug:  albuterol Inhale 2 puffs into the lungs every 6 (six) hours as needed for wheezing or shortness of breath.   sertraline 25 MG tablet Commonly known as:  ZOLOFT Take 25 mg by mouth at bedtime.   SYSTANE BALANCE 0.6 % Soln Generic drug:  Propylene Glycol Place 1 drop into both eyes 2  (two) times daily.   tiotropium 18 MCG inhalation capsule Commonly known as:  SPIRIVA Place 1 capsule (18 mcg total) into inhaler and inhale daily.   vancomycin 125 MG capsule Commonly known as:  VANCOCIN Take 125 mg by mouth 4 (four) times daily.      Follow-up Information    [name], MD Follow up.   Specialty:  Internal Medicine Contact information: [address] [place] [address] [phone]        [facility]. Schedule an appointment as soon as possible for a visit in 1 week(s).   Why:  ASAP for ECHO.  will be called with appt time and date Contact information: [address] [place] [address] [phone]         Allergies  Allergen Reactions  . Entresto [Sacubitril-Valsartan] Other (See Comments)     unknown reaction  .  Latex Itching  . Sulfa Antibiotics Itching    Consultations: Spoke on phone with Cardiology and Nephrology   Procedures/Studies: Dg Chest 2 View  Result Date: 03/03/2017 CLINICAL DATA:  Status post syncope during dialysis. Acute onset of left-sided and central chest pain. Initial encounter. EXAM: CHEST  2 VIEW COMPARISON:  Chest radiograph performed 02/19/2017 FINDINGS: The lungs are hypoexpanded. No pleural effusion or pneumothorax is seen. Minimal bilateral atelectasis is noted. The cardiomediastinal silhouette is mildly enlarged. A right-sided dual-lumen catheter is noted ending about the proximal right atrium. An AICD is noted at the left chest wall, with a single lead ending at the right ventricle. No acute osseous abnormalities are identified. IMPRESSION: Lungs hypoexpanded. Minimal bilateral atelectasis seen. Mild cardiomegaly. Electronically Signed   By: Roanna Raider M.D.   On: 03/03/2017 17:51   Dg Chest 2 View  Result Date: 02/16/2017 CLINICAL DATA:  Confusion and hypotension EXAM: CHEST  2 VIEW COMPARISON:  09/16/2016 FINDINGS: Dialysis  catheter is noted in the right jugular vein in satisfactory position. This is new from the prior exam. A defibrillator is again seen and stable. Cardiomegaly is again noted. Mild vascular congestion is seen likely of a chronic nature. No focal infiltrate or sizable effusion is seen. IMPRESSION: Mild vascular congestion which may be related to volume overload. No other focal abnormality is seen. Electronically Signed   By: Alcide Clever M.D.   On: 02/16/2017 16:13   Ct Head Wo Contrast  Result Date: 03/03/2017 CLINICAL DATA:  Syncope and seizure while at dialysis today EXAM: CT HEAD WITHOUT CONTRAST TECHNIQUE: Contiguous axial images were obtained from the base of the skull through the vertex without intravenous contrast. COMPARISON:  02/19/2017 FINDINGS: Brain: Age-appropriate involutional changes. No acute large vascular territory infarction, hemorrhage nor midline shift. No intra-axial mass nor extra-axial fluid. Minimal small vessel ischemic changes of periventricular white matter. No effacement of the basal cisterns. Fourth ventricle is midline. There is no hydrocephalus. Vascular: Right vertebral and bilateral carotid siphon calcifications. No unexpected calcifications are hyperdense vessels. Skull: Normal. Negative for fracture or focal lesion. Sinuses/Orbits:  Intact orbits and globes.  Mild membrane thickening of the ethmoid sinus. Other: Mastoids are clear. IMPRESSION: Chronic stable small vessel ischemic changes of periventricular white matter. No acute intracranial abnormality. Electronically Signed   By: Tollie Eth M.D.   On: 03/03/2017 19:24   Ct Head Wo Contrast  Result Date: 02/19/2017 CLINICAL DATA:  Acute encephalopathy EXAM: CT HEAD WITHOUT CONTRAST TECHNIQUE: Contiguous axial images were obtained from the base of the skull through the vertex without intravenous contrast. COMPARISON:  None. FINDINGS: Brain: Age-appropriate involutional changes. No acute large vascular territory infarction,  hemorrhage nor midline shift. No intra-axial mass nor extra-axial fluid. Minimal small vessel ischemic changes of periventricular white matter. No effacement of the basal cisterns. Fourth ventricle is midline. There is no hydrocephalus. Vascular: Right vertebral and bilateral carotid siphon calcifications. No unexpected calcifications are hyperdense vessels. Skull: Normal. Negative for fracture or focal lesion. Sinuses/Orbits: Trace fluid in the sphenoid sinus. Mild membrane thickening of the ethmoid sinus. Other: Mastoids are clear. IMPRESSION: Chronic appearing small vessel ischemic disease of periventricular white matter. No acute intracranial abnormality. Trace sphenoid sinus fluid. Electronically Signed   By: Tollie Eth M.D.   On: 02/19/2017 19:29   Ct Abdomen Pelvis W Contrast  Result Date: 02/16/2017 CLINICAL DATA:  66 year old female with multiple medical comorbidities status post transhepatic percutaneous cholecystostomy tube on 02/02/2017. Abdominal pain and hypotension. Initial encounter. EXAM: CT ABDOMEN  AND PELVIS WITH CONTRAST TECHNIQUE: Multidetector CT imaging of the abdomen and pelvis was performed using the standard protocol following bolus administration of intravenous contrast. CONTRAST:  ISOVUE-300 IOPAMIDOL (ISOVUE-300) INJECTION 61%, however, it appears the vast majority of a contrast dose extravasated into the left upper extremity as seen on coronal image 80. COMPARISON:  CT Abdomen and Pelvis 02/01/2017, and earlier. FINDINGS: Essentially this is a noncontrast exam may in light of the IV extravasation into the left upper extremity. Lower chest: Left lower chest wall cardiac AICD type device with streak artifact. Severe cardiomegaly. No pericardial effusion. Continued medial left lower lobe atelectasis or consolidation with superimposed calcified granuloma. Interval improved ventilation at the right lung base with residual peribronchial atelectasis. Virtually resolved bilateral  small pleural effusions. Hepatobiliary: Transhepatic percutaneous cholecystostomy tube in place with decompressed gallbladder. No biliary ductal enlargement. No discrete liver lesion. Pancreas: Negative. Spleen: Negative. Adrenals/Urinary Tract: Mild bilateral adrenal gland thickening compatible with hyperplasia stable. The kidneys appears stable without hydronephrosis or perinephric stranding. Diminutive, unremarkable urinary bladder. No abdominal free fluid. Stomach/Bowel: Decreased stool ball in the rectum but increased circumferential rectal wall thickening, up to 19 mm (series 21, image 69). Similar retained stool in the sigmoid colon and left colon. Similar retained stool in the transverse colon and right colon. Negative appendix (series 21, image 61). No dilated small bowel. Negative stomach and duodenum. Vascular/Lymphatic: Extensive Aortoiliac calcified atherosclerosis. Partially visible left femoral region dialysis graft. Poor vascular contrast bolus. Vascular patency not evaluated. Reproductive: Negative. Other: No pelvic free fluid. Rectus muscle diastases with small developing fat and small bowel containing ventral abdominal hernia, non incarcerated. See sagittal image 79. Musculoskeletal: Chronic severe rose to changes throughout the visible lower thoracic spine to the T12 level, not new but progressed since September 2017. However, these bony changes are new since June of 2017. The superior L1 vertebral body now is being eroded, was intact in September. The erosive changes are accompanied by partially visible pedicle fractures at T7. The visible lower ribs remain intact. Severe lower lumbar facet arthropathy. Pelvis and proximal femurs intact. IMPRESSION: 1. Extravasation of the contrast for this study into the patient left upper extremity near the antecubital fossa. Recommend physical exam evaluation of the IV site, elevation of the extremity, and ice application. Recommend surveillance for  progressive skin changes, which if detected should prompt plastic surgery consultation. 2. Progressively abnormal appearance of the visualized thoracic spine with unusual vertebral body erosion which has developed since June of last year. New erosion of the superior T1 vertebral body since September. A pathologic fracture of T7 is suspected but incompletely visualized. 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 3. Right transhepatic percutaneous cholecystostomy tube with no adverse features. 4. Decreased rectal fecal impaction but circumferential rectal wall thickening suggestive of proctitis. However, no other bowel inflammation. No bowel obstruction. 5. Resolved small bilateral pleural effusions since earlier this month. Residual lower lobe atelectasis. 6. Severe cardiomegaly. Extensive calcified aortoiliac atherosclerosis. 7. Salient findings on this exam were discussed by telephone with Dr. Drema Pry on 02/16/2017 at 1930 hours. Electronically Signed   By: Odessa Fleming M.D.   On: 02/16/2017 19:34   Dg Chest Port 1 View  Result Date: 02/19/2017 CLINICAL DATA:  Seizure-like activities.  Dyspnea. EXAM: PORTABLE CHEST 1 VIEW COMPARISON:  02/16/2017 CXR FINDINGS: Stable cardiomegaly with aortic atherosclerosis. Dialysis catheter tip terminates in the right atrium. ICD device projects over the left hemithorax  with lead in the expected location of the right ventricle. Interval removal of left IJ central line. No overt pulmonary edema, pulmonary consolidation or pneumothorax. 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 No acute osseous abnormality. IMPRESSION: Stable cardiomegaly with aortic atherosclerosis. No acute pneumonic consolidation or CHF. Electronically Signed   By: Tollie Eth M.D.   On: 02/19/2017 17:43   Dg Chest Portable 1 View  Result Date: 02/16/2017 CLINICAL DATA:  Central line placement.  Initial encounter.  EXAM: PORTABLE CHEST 1 VIEW COMPARISON:  Chest radiograph performed earlier today at 4:02 p.m. FINDINGS: The lungs are well-aerated. Mild vascular congestion is noted. Increased interstitial markings may reflect mild interstitial edema. There is no evidence of pleural effusion or pneumothorax. The cardiomediastinal silhouette is enlarged. A right-sided dual-lumen catheter is noted ending at the right atrium. An AICD is noted overlying the left chest wall, with a lead ending overlying the right ventricle. No acute osseous abnormalities are seen. A left IJ line is noted ending about the distal SVC. IMPRESSION: 1. Left IJ line noted ending about the distal SVC. 2. Mild vascular congestion and cardiomegaly noted. Increased interstitial markings raise concern for mild interstitial edema. Electronically Signed   By: Roanna Raider M.D.   On: 02/16/2017 23:52   Dg Abd Portable 1v  Result Date: 02/20/2017 CLINICAL DATA:  Abdominal pain. EXAM: PORTABLE ABDOMEN - 1 VIEW COMPARISON:  CT 3 days prior FINDINGS: Right upper quadrant cholecystostomy tube remains in place. No evidence of free air on supine views. Mild gaseous gastric distention. No small bowel dilatation to suggest obstruction. Small volume of colonic stool. No radiopaque calculi. Cardiomegaly and pacemaker, partially included. IMPRESSION: No evidence of bowel obstruction. Mild-is gastric distension, nonspecific. Cholecystostomy tube in place. Electronically Signed   By: Rubye Oaks M.D.   On: 02/20/2017 00:26    Subjective: Feeling well and back to baseline.  Denies headaches, SOB, cough, new or unusual abdominal pains, nausea.  Eating and drinking well.  Stools are still loose but continue to decrease in frequency.  Would like to be discharged today.    Discharge Exam: Vitals:   03/05/17 0343 03/05/17 0800  BP: (!) 108/53 (!) 150/72  Pulse: (!) 58 64  Resp: 17 18  Temp: 97.8 F (36.6 C) 97 F (36.1 C)   Vitals:   03/04/17 2100 03/05/17  0343 03/05/17 0800 03/05/17 1300  BP: (!) 106/49 (!) 108/53 (!) 150/72   Pulse: (!) 58 (!) 58 64   Resp: 17 17 18    Temp: 97.8 F (36.6 C) 97.8 F (36.6 C) 97 F (36.1 C)   TempSrc: Oral Oral Oral   SpO2: 100% 95% 96%   Weight: 65.1 kg (143 lb 9.6 oz)     Height:    5\' 8"  (1.727 m)    General exam:  Adult female.  No acute distress.  2L [place] in place  HEENT:  NCAT, MMM Respiratory system: Clear to auscultation bilaterally Cardiovascular system: IRRR, normal S1/S2. 2/6 systolic murmur, possible gallop vs. Murmur, no rubs.  Warm extremities Gastrointestinal system: Normal active bowel sounds, soft, nondistended, nontender.  Clear greenish fluid in cholecystomy drain and no surrounding induration or erythema MSK:  Normal tone and bulk, no lower extremity edema Neuro:  Grossly intact   The results of significant diagnostics from this hospitalization (including imaging, microbiology, ancillary and laboratory) are listed below for reference.     Microbiology: Recent Results (from the past 240 hour(s))  Culture, blood (Routine X 2) w Reflex to  ID Panel     Status: None (Preliminary result)   Collection Time: 03/04/17  8:40 AM  Result Value Ref Range Status   Specimen Description BLOOD RIGHT HAND  Final   Special Requests IN PEDIATRIC BOTTLE Blood Culture adequate volume  Final   Culture NO GROWTH < 12 HOURS  Final   Report Status PENDING  Incomplete  Culture, blood (Routine X 2) w Reflex to ID Panel     Status: None (Preliminary result)   Collection Time: 03/04/17  8:55 AM  Result Value Ref Range Status   Specimen Description BLOOD RIGHT HAND  Final   Special Requests IN PEDIATRIC BOTTLE Blood Culture adequate volume  Final   Culture NO GROWTH < 12 HOURS  Final   Report Status PENDING  Incomplete  MRSA PCR Screening     Status: None   Collection Time: 03/04/17 12:59 PM  Result Value Ref Range Status   MRSA by PCR NEGATIVE NEGATIVE Final    Comment:        The GeneXpert MRSA  Assay (FDA approved for NASAL specimens only), is one component of a comprehensive MRSA colonization surveillance program. It is not intended to diagnose MRSA infection nor to guide or monitor treatment for MRSA infections.      Labs: BNP (last 3 results)  Recent Labs  03/20/16 1227 07/15/16 1331 02/16/17 1644  BNP 1,489.8* 1,715.6* 2,422.0*   Basic Metabolic Panel:  Recent Labs Lab 03/03/17 1700 03/04/17 0348 03/05/17 1414  NA 139 138 137  K 3.0* 3.4* 4.2  CL 100* 97* 95*  CO2 25 29 24   GLUCOSE 73 82 115*  BUN 22* 26* 39*  CREATININE 4.26* 4.90* 6.22*  CALCIUM 8.4* 8.4* 8.6*  PHOS  --   --  6.3*   Liver Function Tests:  Recent Labs Lab 03/05/17 1414  ALBUMIN 2.4*   No results for input(s): LIPASE, AMYLASE in the last 168 hours. No results for input(s): AMMONIA in the last 168 hours. CBC:  Recent Labs Lab 03/03/17 1700 03/04/17 0348 03/04/17 0851 03/05/17 1414  WBC 14.7* 10.6*  --  9.1  NEUTROABS  --   --  8.0*  --   HGB 9.3* 8.7*  --  9.9*  HCT 31.3* 29.8*  --  33.6*  MCV 83.2 84.4  --  83.2  PLT 54* 74*  --  113*   Cardiac Enzymes: No results for input(s): CKTOTAL, CKMB, CKMBINDEX, TROPONINI in the last 168 hours. BNP: Invalid input(s): POCBNP CBG:  Recent Labs Lab 03/04/17 1225 03/04/17 1652 03/04/17 2126 03/05/17 0850 03/05/17 1156  GLUCAP 115* 132* 131* 104* 109*    Time coordinating discharge: Over 30 minutes  SIGNED:   Renae Fickle, MD  [facility] 03/05/2017, 3:56 PM Pager   If 7PM-7AM, please contact night-coverage [email or web address] Password [email or web address]

## 2017-03-06 ENCOUNTER — Telehealth: Payer: Self-pay

## 2017-03-06 LAB — HEMOGLOBIN A1C
HEMOGLOBIN A1C: 5 % (ref 4.8–5.6)
MEAN PLASMA GLUCOSE: 97 mg/dL

## 2017-03-06 NOTE — Telephone Encounter (Signed)
This is a patient of [facility], who was admitted to [facility] after hospitalization. [facility] - Hospital F/U is needed. Hospital discharge from [facility] on 03/05/2017.

## 2017-03-07 ENCOUNTER — Encounter: Payer: Self-pay | Admitting: Internal Medicine

## 2017-03-07 ENCOUNTER — Non-Acute Institutional Stay (SKILLED_NURSING_FACILITY): Payer: Medicare PPO | Admitting: Internal Medicine

## 2017-03-07 DIAGNOSIS — I480 Paroxysmal atrial fibrillation: Secondary | ICD-10-CM | POA: Diagnosis not present

## 2017-03-07 DIAGNOSIS — I953 Hypotension of hemodialysis: Secondary | ICD-10-CM | POA: Diagnosis not present

## 2017-03-07 DIAGNOSIS — A0472 Enterocolitis due to Clostridium difficile, not specified as recurrent: Secondary | ICD-10-CM | POA: Diagnosis not present

## 2017-03-07 DIAGNOSIS — R55 Syncope and collapse: Secondary | ICD-10-CM

## 2017-03-07 DIAGNOSIS — R531 Weakness: Secondary | ICD-10-CM | POA: Diagnosis not present

## 2017-03-07 DIAGNOSIS — F329 Major depressive disorder, single episode, unspecified: Secondary | ICD-10-CM

## 2017-03-07 NOTE — Progress Notes (Signed)
LOCATION: [facility]  PCP: [name], MD   Code Status: Full Code  Goals of care: Advanced Directive information Advanced Directives 03/04/2017  Does Patient Have a Medical Advance Directive? No  Type of Advance Directive -  Does patient want to make changes to medical advance directive? No - Patient declined  Copy of Healthcare Power of Attorney in Chart? -  Would patient like information on creating a medical advance directive? No - Patient declined       Extended Emergency Contact Information Primary Emergency Contact: Haith,[name] Address: [address]          [place], [place] [address] [place] Home Phone: [phone] Relation: Sister Secondary Emergency Contact: Marcello Moores, [place] Home Phone: [phone] Relation: Sister   Allergies  Allergen Reactions  . Entresto [Sacubitril-Valsartan] Other (See Comments)     unknown reaction  . Latex Itching  . Sulfa Antibiotics Itching    Chief Complaint  Patient presents with  . Readmit To SNF    Readmission Visit      HPI:  Patient is a 66 y.o. female seen today for long term care post hospital re-admission from 03/03/2017-03/05/2017 post syncopal episode during dialysis. This was thought to be secondary to hypotension post hemodialysis from fluid shifts with her being on beta blocker with history of coronary artery disease and chronic systolic heart failure. CAT scan of the head was negative for acute intracranial abnormality. Her metoprolol was discontinued and her midodrine dose was increased. Her pacemaker was interrogated and no abnormality was found. Her antibiotic course for C. difficile colitis was continued during the hospital stay. She is seen in her room today. She has multiple medical comorbidities including atrial fibrillation, coronary artery disease, chronic systolic heart failure, type 2 diabetes mellitus, end-stage renal disease on  hemodialysis among others. She has had frequent hospitalization and remains at high risk for readmission.  Review of Systems:  Constitutional: Negative for fever, chills, diaphoresis.  HENT: Negative for headache, congestion,sore throat, difficulty swallowing. Positive for occasional nasal discharge.   Eyes: Negative for eye pain, blurred vision, double vision and discharge.  Respiratory: Negative for shortness of breath and wheezing. Positive for cough.  Cardiovascular: Negative for chest pain, palpitations.  Gastrointestinal: Negative for heartburn, nausea, vomiting, loss of appetite, melena. Last bowel movement was this morning with firm stool. Positive for occasional pain to biliary drain area. Genitourinary: Negative for dysuria and flank pain. she is anuric  Musculoskeletal: Negative for pain, fall in the facility.  Skin: Negative for itching, rash.  Neurological: Negative for dizziness. Psychiatric/Behavioral: Negative for depression.   Past Medical History:  Diagnosis Date  . AICD (automatic cardioverter/defibrillator) present    Biotronik Inventra 7 VR-T DX 04/12/15  . Anemia   . Anxiety   . C. difficile colitis   . Cardiomyopathy (HCC)    02/02/16 ([facility]): Mixed ischemic and non-ischemic cardiomyopathy  . Charcot's joint of left foot   . CHF (congestive heart failure) (HCC)    systolic  . Cholelithiases 01/2017  . Complication of anesthesia 02/02/2017   has anxeity when awaken with gallbladder surgery  . COPD (chronic obstructive pulmonary disease) (HCC)   . Coronary artery disease   . Diabetes mellitus (HCC)    Type II  . DVT (deep venous thrombosis) (HCC)    on coumadin  . ESRD (end stage renal disease) (HCC)    tues/thurs/sat dialysis  .  Hypertension   . Neuropathy (HCC)   . Obstructive sleep apnea    no cpap  . Renal disorder   . Wears glasses    Past Surgical History:  Procedure Laterality Date  . AV FISTULA PLACEMENT Left 04/03/2016   Procedure:  ARTERIOVENOUS (AV) FISTULA CREATION;  Surgeon: Larina Earthly, MD;  Location: [facility] OR;  Service: Vascular;  Laterality: Left;  . AV FISTULA PLACEMENT Right 08/21/2016   Procedure: INSERTION RIGHT ARM ARTERIOVENOUS GRAFT USING 4-7MM X45  CM ACUSEAL GRAFT;  Surgeon: Maeola Harman, MD;  Location: [facility] OR;  Service: Vascular;  Laterality: Right;  . AV FISTULA PLACEMENT Left 02/12/2017   Procedure: INSERTION OF ARTERIOVENOUS (AV) GORE-TEX GRAFT THIGH;  Surgeon: Maeola Harman, MD;  Location: [facility] OR;  Service: Vascular;  Laterality: Left;  Using Goretex 4-52mm Vascular Graft  . CORONARY ANGIOPLASTY     LCX stent 2010; by [facility] 01/2016 note: 03/2014 LHC/RHC: minor luminal irregularities LAD and RCA with patent stent first marginal branch of CX. RA 11, PAP 58/22 with mean 39, mean wedge pressure 19, LVEDP 27, PVR 3.9, CO 5.1, Cardiac index 2.5.   . FISTULA SUPERFICIALIZATION Left 06/16/2016   Procedure: SUPERFICIALIZATION OF LEFT ARM BRACHIOCEPHALIC ARTERIOVENOUS FISTULA;  Surgeon: Chuck Hint, MD;  Location: [facility] OR;  Service: Vascular;  Laterality: Left;  . INSERTION OF DIALYSIS CATHETER Left 04/03/2016   Procedure: INSERTION OF DIALYSIS CATHETER;  Surgeon: Larina Earthly, MD;  Location: [facility] OR;  Service: Vascular;  Laterality: Left;  . IR GENERIC HISTORICAL  02/02/2017   IR PERC CHOLECYSTOSTOMY 02/02/2017 Malachy Moan, MD [facility]-INTERV RAD  . LIGATION OF COMPETING BRANCHES OF ARTERIOVENOUS FISTULA Left 06/16/2016   Procedure: LIGATION OF COMPETING BRANCHES OF left brachiocephalic ARTERIOVENOUS FISTULA;  Surgeon: Chuck Hint, MD;  Location: [facility] OR;  Service: Vascular;  Laterality: Left;  . OTHER SURGICAL HISTORY  02/02/2017   Transhepatic percutaneous cholecystostomy placement  . PERIPHERAL VASCULAR CATHETERIZATION N/A 07/20/2016   Procedure: A/V Shuntogram/Fistulagram;  Surgeon: Fransisco Hertz, MD;  Location: [facility] INVASIVE CV LAB;  Service: Cardiovascular;  Laterality: N/A;  .  PERIPHERAL VASCULAR CATHETERIZATION N/A 07/20/2016   Procedure: Dialysis/Perma Catheter Insertion;  Surgeon: Fransisco Hertz, MD;  Location: [facility] INVASIVE CV LAB;  Service: Cardiovascular;  Laterality: N/A;  . REVISION OF ARTERIOVENOUS GORETEX GRAFT Left 06/16/2016   Procedure: REVISION OF LEFT BRACHIOCEPHALIC ARTERIOVENOUS FISTULA WITH RESECTION OF REDUNDANT SECTION;  Surgeon: Chuck Hint, MD;  Location: [facility] OR;  Service: Vascular;  Laterality: Left;   Social History:   reports that she quit smoking about 11 years ago. Her smoking use included Cigarettes. She has never used smokeless tobacco. She reports that she does not drink alcohol or use drugs.  No family history on file.  Medications: Allergies as of 03/07/2017      Reactions   Entresto [sacubitril-valsartan] Other (See Comments)    unknown reaction   Latex Itching   Sulfa Antibiotics Itching      Medication List       Accurate as of 03/07/17 11:09 AM. Always use your most recent med list.          acetaminophen 325 MG tablet Commonly known as:  TYLENOL Take 325 mg by mouth every 6 (six) hours as needed for mild pain or fever (For fever >99.5).   amiodarone 200 MG tablet Commonly known as:  PACERONE Take 1 tablet (200 mg total) by mouth daily.   atorvastatin 10 MG tablet Commonly known as:  LIPITOR Take 10 mg by mouth at bedtime.   CERTAVITE/ANTIOXIDANTS Tabs Take 1 tablet by mouth at bedtime.   famotidine 20 MG tablet Commonly known as:  PEPCID Take 20 mg by mouth at bedtime.   FIBER CHOICE PO Take 2 tablets by mouth daily as needed (Constipation).   fluticasone 110 MCG/ACT inhaler Commonly known as:  FLOVENT HFA Inhale 2 puffs into the lungs 2 (two) times daily.   hydrOXYzine 50 MG tablet Commonly known as:  ATARAX/VISTARIL Take 50 mg by mouth every 6 (six) hours as needed for itching.   insulin lispro 100 UNIT/ML injection Commonly known as:  HUMALOG Inject 0-8 Units into the skin 3 (three) times  daily as needed for high blood sugar (CBG >180). CBG 0-59 Hypoglycemic Protocol, 60-149 0 units 150-250 5 units, 251-300 8 units, 301-350 8 units, >350 call MD   lactose free nutrition Liqd Take 237 mLs by mouth daily.   ENSURE CLEAR Liqd Take 1 each by mouth daily.   midodrine 10 MG tablet Commonly known as:  PROAMATINE Take 1 tablet (10 mg total) by mouth 3 (three) times daily with meals.   mirtazapine 15 MG tablet Commonly known as:  REMERON Take 15 mg by mouth at bedtime.   nitroGLYCERIN 0.4 MG SL tablet Commonly known as:  NITROSTAT Place 0.4 mg under the tongue every 5 (five) minutes as needed for chest pain.   oxyCODONE-acetaminophen 5-325 MG tablet Commonly known as:  PERCOCET/ROXICET Take 1 tablet by mouth every 6 (six) hours as needed for severe pain.   OXYGEN Inhale 2 L into the lungs as needed.   PROAIR HFA 108 (90 Base) MCG/ACT inhaler Generic drug:  albuterol Inhale 2 puffs into the lungs every 6 (six) hours as needed for wheezing or shortness of breath.   sertraline 25 MG tablet Commonly known as:  ZOLOFT Take 25 mg by mouth at bedtime.   SYSTANE BALANCE 0.6 % Soln Generic drug:  Propylene Glycol Place 1 drop into both eyes 2 (two) times daily.   tiotropium 18 MCG inhalation capsule Commonly known as:  SPIRIVA Place 1 capsule (18 mcg total) into inhaler and inhale daily.   vancomycin 125 MG capsule Commonly known as:  VANCOCIN Take 125 mg by mouth 4 (four) times daily.       Immunizations: Immunization History  Administered Date(s) Administered  . Influenza, High Dose Seasonal PF 08/17/2016  . PPD Test 03/01/2016, 04/10/2016  . Pneumococcal-Unspecified 01/16/2017     Physical Exam: Vitals:   03/07/17 1046  BP: (!) 120/56  Pulse: 76  Resp: 20  Temp: (!) 96.9 F (36.1 C)  TempSrc: Oral  SpO2: 96%  Weight: 134 lb (60.8 kg)  Height: 5\' 8"  (1.727 m)   Body mass index is 20.37 kg/m.  General- elderly female, Frail and thin built, in  no acute distress Head- normocephalic, atraumatic Nose- no maxillary or frontal sinus tenderness, no nasal discharge Throat- moist mucus membrane, normal oropharynx Eyes- PERRLA, EOMI, no pallor, no icterus, no discharge, normal conjunctiva, normal sclera Neck- no cervical lymphadenopathy Cardiovascular- normal s1,s2, no murmur Respiratory- bilateral clear to auscultation, no wheeze, no rhonchi, no crackles, no use of accessory muscles Abdomen- bowel sounds present, soft, non tender, no guarding or rigidity, biliary drain in place with clean dressing Musculoskeletal- able to move all 4 extremities, generalized weakness, no leg edema, arthritis changes to her fingers Neurological- alert and oriented to person, place and time Skin- warm and dry, tunneled catheter to right chest wall for  dialysis Psychiatry- normal mood and affect, poor insight into her medical issues    Labs reviewed: Basic Metabolic Panel:  Recent Labs  16/10/96 0356 02/18/17 0415 02/19/17 0528  02/20/17 0845 02/22/17 0912  03/03/17 1700 03/04/17 0348 03/05/17 1414  NA 133* 138 137  --  140 136  < > 139 138 137  K 3.7 3.5 3.3*  --  3.2* 3.3*  < > 3.0* 3.4* 4.2  CL 91* 99* 98*  --  101 99*  --  100* 97* 95*  CO2 24 29 27   --  26 27  --  25 29 24   GLUCOSE 157* 84 84  --  74 111*  --  73 82 115*  BUN 25* 9 15  --  19 18  < > 22* 26* 39*  CREATININE 4.31* 2.46* 3.62*  --  4.63* 4.30*  < > 4.26* 4.90* 6.22*  CALCIUM 8.1* 8.0* 8.1*  --  8.5* 8.2*  --  8.4* 8.4* 8.6*  MG 2.0 1.9 1.8  --   --   --   --   --   --   --   PHOS 6.4*  --   --   < > 3.4 1.6*  --   --   --  6.3*  < > = values in this interval not displayed. Liver Function Tests:  Recent Labs  02/01/17 1924  02/12/17 1913 02/16/17 1644 02/20/17 0845 02/22/17 0912 03/05/17 1414  AST 15  --  12* 16  --   --   --   ALT 9*  --  7* 7*  --   --   --   ALKPHOS 199*  --  121 141*  --   --   --   BILITOT 0.6  --  0.3 1.2  --   --   --   PROT 8.3*  --  7.9  8.3*  --   --   --   ALBUMIN 1.8*  < > 1.8* 1.6* 2.1* 1.9* 2.4*  < > = values in this interval not displayed.  Recent Labs  08/26/16 2046 02/01/17 1924 02/16/17 1644  LIPASE 22 19 <10*    Recent Labs  02/19/17 1811  AMMONIA 31   CBC:  Recent Labs  02/16/17 1644 02/17/17 0356  03/03/17 1700 03/04/17 0348 03/04/17 0851 03/05/17 1414  WBC 9.4 11.0*  < > 14.7* 10.6*  --  9.1  NEUTROABS 7.1 9.0*  --   --   --  8.0*  --   HGB 8.6* 9.2*  < > 9.3* 8.7*  --  9.9*  HCT 29.7* 31.1*  < > 31.3* 29.8*  --  33.6*  MCV 82.3 81.6  < > 83.2 84.4  --  83.2  PLT 264 291  < > 54* 74*  --  113*  < > = values in this interval not displayed. Cardiac Enzymes: No results for input(s): CKTOTAL, CKMB, CKMBINDEX, TROPONINI in the last 8760 hours. BNP: Invalid input(s): POCBNP CBG:  Recent Labs  03/05/17 0850 03/05/17 1156 03/05/17 1709  GLUCAP 104* 109* 102*    Radiological Exams: Dg Chest 2 View  Result Date: 03/03/2017 CLINICAL DATA:  Status post syncope during dialysis. Acute onset of left-sided and central chest pain. Initial encounter. EXAM: CHEST  2 VIEW COMPARISON:  Chest radiograph performed 02/19/2017 FINDINGS: The lungs are hypoexpanded. No pleural effusion or pneumothorax is seen. Minimal bilateral atelectasis is noted. The cardiomediastinal silhouette is mildly enlarged. A right-sided dual-lumen catheter is  noted ending about the proximal right atrium. An AICD is noted at the left chest wall, with a single lead ending at the right ventricle. No acute osseous abnormalities are identified. IMPRESSION: Lungs hypoexpanded. Minimal bilateral atelectasis seen. Mild cardiomegaly. Electronically Signed   By: Roanna Raider M.D.   On: 03/03/2017 17:51   Dg Chest 2 View  Result Date: 02/16/2017 CLINICAL DATA:  Confusion and hypotension EXAM: CHEST  2 VIEW COMPARISON:  09/16/2016 FINDINGS: Dialysis catheter is noted in the right jugular vein in satisfactory position. This is new from the  prior exam. A defibrillator is again seen and stable. Cardiomegaly is again noted. Mild vascular congestion is seen likely of a chronic nature. No focal infiltrate or sizable effusion is seen. IMPRESSION: Mild vascular congestion which may be related to volume overload. No other focal abnormality is seen. Electronically Signed   By: Alcide Clever M.D.   On: 02/16/2017 16:13   Ct Head Wo Contrast  Result Date: 03/03/2017 CLINICAL DATA:  Syncope and seizure while at dialysis today EXAM: CT HEAD WITHOUT CONTRAST TECHNIQUE: Contiguous axial images were obtained from the base of the skull through the vertex without intravenous contrast. COMPARISON:  02/19/2017 FINDINGS: Brain: Age-appropriate involutional changes. No acute large vascular territory infarction, hemorrhage nor midline shift. No intra-axial mass nor extra-axial fluid. Minimal small vessel ischemic changes of periventricular white matter. No effacement of the basal cisterns. Fourth ventricle is midline. There is no hydrocephalus. Vascular: Right vertebral and bilateral carotid siphon calcifications. No unexpected calcifications are hyperdense vessels. Skull: Normal. Negative for fracture or focal lesion. Sinuses/Orbits:  Intact orbits and globes.  Mild membrane thickening of the ethmoid sinus. Other: Mastoids are clear. IMPRESSION: Chronic stable small vessel ischemic changes of periventricular white matter. No acute intracranial abnormality. Electronically Signed   By: Tollie Eth M.D.   On: 03/03/2017 19:24   Ct Head Wo Contrast  Result Date: 02/19/2017 CLINICAL DATA:  Acute encephalopathy EXAM: CT HEAD WITHOUT CONTRAST TECHNIQUE: Contiguous axial images were obtained from the base of the skull through the vertex without intravenous contrast. COMPARISON:  None. FINDINGS: Brain: Age-appropriate involutional changes. No acute large vascular territory infarction, hemorrhage nor midline shift. No intra-axial mass nor extra-axial fluid. Minimal small  vessel ischemic changes of periventricular white matter. No effacement of the basal cisterns. Fourth ventricle is midline. There is no hydrocephalus. Vascular: Right vertebral and bilateral carotid siphon calcifications. No unexpected calcifications are hyperdense vessels. Skull: Normal. Negative for fracture or focal lesion. Sinuses/Orbits: Trace fluid in the sphenoid sinus. Mild membrane thickening of the ethmoid sinus. Other: Mastoids are clear. IMPRESSION: Chronic appearing small vessel ischemic disease of periventricular white matter. No acute intracranial abnormality. Trace sphenoid sinus fluid. Electronically Signed   By: Tollie Eth M.D.   On: 02/19/2017 19:29   Ct Abdomen Pelvis W Contrast  Result Date: 02/16/2017 CLINICAL DATA:  66 year old female with multiple medical comorbidities status post transhepatic percutaneous cholecystostomy tube on 02/02/2017. Abdominal pain and hypotension. Initial encounter. EXAM: CT ABDOMEN AND PELVIS WITH CONTRAST TECHNIQUE: Multidetector CT imaging of the abdomen and pelvis was performed using the standard protocol following bolus administration of intravenous contrast. CONTRAST:  ISOVUE-300 IOPAMIDOL (ISOVUE-300) INJECTION 61%, however, it appears the vast majority of a contrast dose extravasated into the left upper extremity as seen on coronal image 80. COMPARISON:  CT Abdomen and Pelvis 02/01/2017, and earlier. FINDINGS: Essentially this is a noncontrast exam may in light of the IV extravasation into the left upper extremity. Lower chest: Left  lower chest wall cardiac AICD type device with streak artifact. Severe cardiomegaly. No pericardial effusion. Continued medial left lower lobe atelectasis or consolidation with superimposed calcified granuloma. Interval improved ventilation at the right lung base with residual peribronchial atelectasis. Virtually resolved bilateral small pleural effusions. Hepatobiliary: Transhepatic percutaneous cholecystostomy tube in  place with decompressed gallbladder. No biliary ductal enlargement. No discrete liver lesion. Pancreas: Negative. Spleen: Negative. Adrenals/Urinary Tract: Mild bilateral adrenal gland thickening compatible with hyperplasia stable. The kidneys appears stable without hydronephrosis or perinephric stranding. Diminutive, unremarkable urinary bladder. No abdominal free fluid. Stomach/Bowel: Decreased stool ball in the rectum but increased circumferential rectal wall thickening, up to 19 mm (series 21, image 69). Similar retained stool in the sigmoid colon and left colon. Similar retained stool in the transverse colon and right colon. Negative appendix (series 21, image 61). No dilated small bowel. Negative stomach and duodenum. Vascular/Lymphatic: Extensive Aortoiliac calcified atherosclerosis. Partially visible left femoral region dialysis graft. Poor vascular contrast bolus. Vascular patency not evaluated. Reproductive: Negative. Other: No pelvic free fluid. Rectus muscle diastases with small developing fat and small bowel containing ventral abdominal hernia, non incarcerated. See sagittal image 79. Musculoskeletal: Chronic severe rose to changes throughout the visible lower thoracic spine to the T12 level, not new but progressed since September 2017. However, these bony changes are new since June of 2017. The superior L1 vertebral body now is being eroded, was intact in September. The erosive changes are accompanied by partially visible pedicle fractures at T7. The visible lower ribs remain intact. Severe lower lumbar facet arthropathy. Pelvis and proximal femurs intact. IMPRESSION: 1. Extravasation of the contrast for this study into the patient left upper extremity near the antecubital fossa. Recommend physical exam evaluation of the IV site, elevation of the extremity, and ice application. Recommend surveillance for progressive skin changes, which if detected should prompt plastic surgery consultation. 2.  Progressively abnormal appearance of the visualized thoracic spine with unusual vertebral body erosion which has developed since June of last year. New erosion of the superior T1 vertebral body since September. A pathologic fracture of T7 is suspected but incompletely visualized. 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 3. Right transhepatic percutaneous cholecystostomy tube with no adverse features. 4. Decreased rectal fecal impaction but circumferential rectal wall thickening suggestive of proctitis. However, no other bowel inflammation. No bowel obstruction. 5. Resolved small bilateral pleural effusions since earlier this month. Residual lower lobe atelectasis. 6. Severe cardiomegaly. Extensive calcified aortoiliac atherosclerosis. 7. Salient findings on this exam were discussed by telephone with Dr. Drema Pry on 02/16/2017 at 1930 hours. Electronically Signed   By: Odessa Fleming M.D.   On: 02/16/2017 19:34   Dg Chest Port 1 View  Result Date: 02/19/2017 CLINICAL DATA:  Seizure-like activities.  Dyspnea. EXAM: PORTABLE CHEST 1 VIEW COMPARISON:  02/16/2017 CXR FINDINGS: Stable cardiomegaly with aortic atherosclerosis. Dialysis catheter tip terminates in the right atrium. ICD device projects over the left hemithorax with lead in the expected location of the right ventricle. Interval removal of left IJ central line. No overt pulmonary edema, pulmonary consolidation or pneumothorax. 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 No acute osseous abnormality. IMPRESSION: Stable cardiomegaly with aortic atherosclerosis. No acute pneumonic consolidation or CHF. Electronically Signed   By: Tollie Eth M.D.   On: 02/19/2017 17:43   Dg Chest Portable 1 View  Result Date: 02/16/2017 CLINICAL DATA:  Central line placement.  Initial encounter. EXAM: PORTABLE CHEST 1  VIEW COMPARISON:  Chest radiograph performed earlier today at 4:02  p.m. FINDINGS: The lungs are well-aerated. Mild vascular congestion is noted. Increased interstitial markings may reflect mild interstitial edema. There is no evidence of pleural effusion or pneumothorax. The cardiomediastinal silhouette is enlarged. A right-sided dual-lumen catheter is noted ending at the right atrium. An AICD is noted overlying the left chest wall, with a lead ending overlying the right ventricle. No acute osseous abnormalities are seen. A left IJ line is noted ending about the distal SVC. IMPRESSION: 1. Left IJ line noted ending about the distal SVC. 2. Mild vascular congestion and cardiomegaly noted. Increased interstitial markings raise concern for mild interstitial edema. Electronically Signed   By: Roanna Raider M.D.   On: 02/16/2017 23:52   Dg Abd Portable 1v  Result Date: 02/20/2017 CLINICAL DATA:  Abdominal pain. EXAM: PORTABLE ABDOMEN - 1 VIEW COMPARISON:  CT 3 days prior FINDINGS: Right upper quadrant cholecystostomy tube remains in place. No evidence of free air on supine views. Mild gaseous gastric distention. No small bowel dilatation to suggest obstruction. Small volume of colonic stool. No radiopaque calculi. Cardiomegaly and pacemaker, partially included. IMPRESSION: No evidence of bowel obstruction. Mild-is gastric distension, nonspecific. Cholecystostomy tube in place. Electronically Signed   By: Rubye Oaks M.D.   On: 02/20/2017 00:26     Assessment/Plan  Generalized weakness From deconditioning. Has had frequent hospitalization. Get a palliative care consult to review further goals of care. At present patient wants to remain full code and every medical care be provided. She will need a detailed goal of care discussion given her poor prognosis and high risk for rehospitalization. Will have patient work with PT/OT as tolerated to regain strength and restore function.  Fall precautions are in place.  syncope Due to hypotension from fluid shifts during  hemodialysis. Currently off metoprolol. Monitor clinically  Hypotension related to hemodialysis Her midodrine dose was increased this hospital admission. Continue midodrine 10 mg 3 times a day and monitor blood pressure.  c.diff colitis Patient denies any further loose stool. Discontinue by mouth vancomycin and monitor clinically. Maintain hydration and monitor for loose stool  Chronic depression Mood has been stable. Continue sertraline 25 mg daily and decrease Remeron to 7.5 mg daily for now and monitor.  Atrial fibrillation Her pacemaker was interrogated this hospital admission with no abnormality noted. Continue amiodarone 200 mg daily. Off metoprolol. Not on anticoagulation.     Goals of care: long term care   Labs/tests ordered: cbc, bmp 03/12/17  Family/ staff Communication: reviewed care plan with patient and nursing supervisor  I have spent greater than 40 minutes for this encounter which includes reviewing hospital records, addressing above mentioned concerns, reviewing care plan with patient, answering patient's concerns and counseling her.     [name], MD Internal Medicine [facility] [address] [place], [place] [address] Cell Phone (Monday-Friday 8 am - 5 pm): [phone] On Call: [phone] and follow prompts after 5 pm and on weekends Office Phone: [phone] Office Fax: [phone]

## 2017-03-09 LAB — CULTURE, BLOOD (ROUTINE X 2)
CULTURE: NO GROWTH
CULTURE: NO GROWTH
SPECIAL REQUESTS: ADEQUATE
SPECIAL REQUESTS: ADEQUATE

## 2017-03-14 ENCOUNTER — Ambulatory Visit
Admission: RE | Admit: 2017-03-14 | Discharge: 2017-03-14 | Disposition: A | Discharge status: Home / Self Care | Payer: Medicare PPO | Source: Ambulatory Visit | Attending: General Surgery | Admitting: General Surgery

## 2017-03-14 DIAGNOSIS — K810 Acute cholecystitis: Secondary | ICD-10-CM

## 2017-03-14 HISTORY — PX: IR RADIOLOGIST EVAL & MGMT: IMG5224

## 2017-03-14 NOTE — Progress Notes (Signed)
Chief Complaint: Acute cholecystitis S/P Perc Chole by Dr. Archer Asa 02/02/2017  Referring Physician(s): Violeta Gelinas  Supervising Physician: Richarda Overlie  History of Present Illness: Brandy Dudley is a 66 y.o. female who is seen in clinic today for perc chole injection.  She has a past medial history of cardiomyopathy, CHF, COPD, DVT not on anticoagulation, CAD, DM2, and ESRD on HD who presented to [facility] ED with complaint of abdominal pain.   CT Abd/Pelvis 02/01/17: 1. Mild mucosal enhancement with small volume free pericholecystic fluid and subtle hazy stranding about the gallbladder. Findings may reflect sequelae of acute cholecystitis. Correlation were laboratory values recommended. Additionally, this could be further assessed with dedicated right upper quadrant ultrasound as indicated. 2. Large amount of impacted stool within the rectal vault, similar to previous. 3. Cardiomegaly with bilateral pleural effusions and diffuse anasarca. Associated bibasilar atelectasis within the lung bases. 4. 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  She was found to have acute cholecystitis.   Patient was evaluated by cardiology to assess cardiac risk for surgery and was determined to be at high risk.  IR consulted for percutaneous cholecystostomy placement at the request of Dr. Harlon Flor.   Case reviewed by Dr. Archer Asa who feels patient is appropriate for procedure.   She underwent perc chole on 02/02/2017.  She is a fairly good historian but is confused about who her doctors are.  She states she "saw the surgeon at [facility]" on Friday but can't remember the name.  She tells me "the surgeon said he can never remove my gallbladder because my heart is too bad".  We do not have access to the surgeons outpatient records.  She denies any issues regarding the  drain. She is tolerating PO. No N/V  She does have some bilateral rib pain secondary to pneumonia and coughing.  She confirms the nurses at her SNF have been flushing the drain twice per day.   Past Medical History:  Diagnosis Date  . AICD (automatic cardioverter/defibrillator) present    Biotronik Inventra 7 VR-T DX 04/12/15  . Anemia   . Anxiety   . C. difficile colitis   . Cardiomyopathy (HCC)    02/02/16 ([facility]): Mixed ischemic and non-ischemic cardiomyopathy  . Charcot's joint of left foot   . CHF (congestive heart failure) (HCC)    systolic  . Cholelithiases 01/2017  . Complication of anesthesia 02/02/2017   has anxeity when awaken with gallbladder surgery  . COPD (chronic obstructive pulmonary disease) (HCC)   . Coronary artery disease   . Diabetes mellitus (HCC)    Type II  . DVT (deep venous thrombosis) (HCC)    on coumadin  . ESRD (end stage renal disease) (HCC)    tues/thurs/sat dialysis  . Hypertension   . Neuropathy   . Obstructive sleep apnea    no cpap  . Renal disorder   . Wears glasses     Past Surgical History:  Procedure Laterality Date  . AV FISTULA PLACEMENT Left 04/03/2016   Procedure: ARTERIOVENOUS (AV) FISTULA CREATION;  Surgeon: Larina Earthly, MD;  Location: [facility] OR;  Service: Vascular;  Laterality: Left;  . AV FISTULA PLACEMENT Right 08/21/2016   Procedure: INSERTION RIGHT ARM ARTERIOVENOUS GRAFT USING 4-7MM X45  CM ACUSEAL GRAFT;  Surgeon: Maeola Harman, MD;  Location: [facility] OR;  Service: Vascular;  Laterality: Right;  . AV FISTULA PLACEMENT Left 02/12/2017  Procedure: INSERTION OF ARTERIOVENOUS (AV) GORE-TEX GRAFT THIGH;  Surgeon: Maeola Harman, MD;  Location: [facility] OR;  Service: Vascular;  Laterality: Left;  Using Goretex 4-58mm Vascular Graft  . CORONARY ANGIOPLASTY     LCX stent 2010; by [facility] 01/2016 note: 03/2014 LHC/RHC: minor luminal irregularities LAD and RCA with patent stent first marginal branch of CX. RA  11, PAP 58/22 with mean 39, mean wedge pressure 19, LVEDP 27, PVR 3.9, CO 5.1, Cardiac index 2.5.   . FISTULA SUPERFICIALIZATION Left 06/16/2016   Procedure: SUPERFICIALIZATION OF LEFT ARM BRACHIOCEPHALIC ARTERIOVENOUS FISTULA;  Surgeon: Chuck Hint, MD;  Location: [facility] OR;  Service: Vascular;  Laterality: Left;  . INSERTION OF DIALYSIS CATHETER Left 04/03/2016   Procedure: INSERTION OF DIALYSIS CATHETER;  Surgeon: Larina Earthly, MD;  Location: [facility] OR;  Service: Vascular;  Laterality: Left;  . IR GENERIC HISTORICAL  02/02/2017   IR PERC CHOLECYSTOSTOMY 02/02/2017 Malachy Moan, MD [facility]-INTERV RAD  . LIGATION OF COMPETING BRANCHES OF ARTERIOVENOUS FISTULA Left 06/16/2016   Procedure: LIGATION OF COMPETING BRANCHES OF left brachiocephalic ARTERIOVENOUS FISTULA;  Surgeon: Chuck Hint, MD;  Location: [facility] OR;  Service: Vascular;  Laterality: Left;  . OTHER SURGICAL HISTORY  02/02/2017   Transhepatic percutaneous cholecystostomy placement  . PERIPHERAL VASCULAR CATHETERIZATION N/A 07/20/2016   Procedure: A/V Shuntogram/Fistulagram;  Surgeon: Fransisco Hertz, MD;  Location: [facility] INVASIVE CV LAB;  Service: Cardiovascular;  Laterality: N/A;  . PERIPHERAL VASCULAR CATHETERIZATION N/A 07/20/2016   Procedure: Dialysis/Perma Catheter Insertion;  Surgeon: Fransisco Hertz, MD;  Location: [facility] INVASIVE CV LAB;  Service: Cardiovascular;  Laterality: N/A;  . REVISION OF ARTERIOVENOUS GORETEX GRAFT Left 06/16/2016   Procedure: REVISION OF LEFT BRACHIOCEPHALIC ARTERIOVENOUS FISTULA WITH RESECTION OF REDUNDANT SECTION;  Surgeon: Chuck Hint, MD;  Location: [facility] OR;  Service: Vascular;  Laterality: Left;    Allergies: Entresto [sacubitril-valsartan]; Latex; and Sulfa antibiotics  Medications: Prior to Admission medications   Medication Sig Start Date End Date Taking? Authorizing Provider  acetaminophen (TYLENOL) 325 MG tablet Take 325 mg by mouth every 6 (six) hours as needed for mild pain or fever (For fever  >99.5).     Historical Provider, MD  albuterol (PROAIR HFA) 108 (90 Base) MCG/ACT inhaler Inhale 2 puffs into the lungs every 6 (six) hours as needed for wheezing or shortness of breath.     Historical Provider, MD  amiodarone (PACERONE) 200 MG tablet Take 1 tablet (200 mg total) by mouth daily. 10/11/16   Marinus Maw, MD  atorvastatin (LIPITOR) 10 MG tablet Take 10 mg by mouth at bedtime.     Historical Provider, MD  famotidine (PEPCID) 20 MG tablet Take 20 mg by mouth at bedtime.    Historical Provider, MD  fluticasone (FLOVENT HFA) 110 MCG/ACT inhaler Inhale 2 puffs into the lungs 2 (two) times daily.     Historical Provider, MD  hydrOXYzine (ATARAX/VISTARIL) 50 MG tablet Take 50 mg by mouth every 6 (six) hours as needed for itching.    Historical Provider, MD  insulin lispro (HUMALOG) 100 UNIT/ML injection Inject 0-8 Units into the skin 3 (three) times daily as needed for high blood sugar (CBG >180). CBG 0-59 Hypoglycemic Protocol, 60-149 0 units 150-250 5 units, 251-300 8 units, 301-350 8 units, >350 call MD    Historical Provider, MD  Inulin (FIBER CHOICE PO) Take 2 tablets by mouth daily as needed (Constipation).     Historical Provider, MD  lactose free nutrition (BOOST PLUS) LIQD  Take 237 mLs by mouth daily.     Historical Provider, MD  midodrine (PROAMATINE) 10 MG tablet Take 1 tablet (10 mg total) by mouth 3 (three) times daily with meals. 03/05/17   Renae Fickle, MD  mirtazapine (REMERON) 15 MG tablet Take 15 mg by mouth at bedtime.    Historical Provider, MD  Multiple Vitamins-Minerals (CERTAVITE/ANTIOXIDANTS) TABS Take 1 tablet by mouth at bedtime.    Historical Provider, MD  nitroGLYCERIN (NITROSTAT) 0.4 MG SL tablet Place 0.4 mg under the tongue every 5 (five) minutes as needed for chest pain.    Historical Provider, MD  Nutritional Supplements (ENSURE CLEAR) LIQD Take 1 each by mouth daily.     Historical Provider, MD  oxyCODONE-acetaminophen (PERCOCET/ROXICET) 5-325 MG tablet  Take 1 tablet by mouth every 6 (six) hours as needed for severe pain.    Historical Provider, MD  OXYGEN Inhale 2 L into the lungs as needed.    Historical Provider, MD  Propylene Glycol (SYSTANE BALANCE) 0.6 % SOLN Place 1 drop into both eyes 2 (two) times daily.    Historical Provider, MD  sertraline (ZOLOFT) 25 MG tablet Take 25 mg by mouth at bedtime.     Historical Provider, MD  tiotropium (SPIRIVA) 18 MCG inhalation capsule Place 1 capsule (18 mcg total) into inhaler and inhale daily. 02/24/17   Penny Pia, MD  vancomycin (VANCOCIN) 125 MG capsule Take 125 mg by mouth 4 (four) times daily.    Historical Provider, MD     No family history on file.  Social History   Social History  . Marital status: Single    Spouse name: N/A  . Number of children: N/A  . Years of education: N/A   Social History Main Topics  . Smoking status: Former Smoker    Types: Cigarettes    Quit date: 03/03/2006  . Smokeless tobacco: Never Used  . Alcohol use No  . Drug use: No  . Sexual activity: No   Other Topics Concern  . Not on file   Social History Narrative   Admit to [facility] 04/10/16   Never married   Former smoker - 2007   Alcohol none   Full Code    Review of Systems: A 12 point ROS discussed  Review of Systems  Constitutional: Negative.   HENT: Negative.   Respiratory: Positive for cough.   Cardiovascular:       Rib pain secondary to PNA and cough  Gastrointestinal: Negative.   Genitourinary: Negative.   Musculoskeletal: Negative.   Skin: Negative.   Neurological: Negative.   Hematological: Negative.   Psychiatric/Behavioral: Negative.     Vital Signs: There were no vitals taken for this visit.  Physical Exam  Constitutional: She is oriented to person, place, and time. She appears well-developed.  HENT:  Head: Normocephalic and atraumatic.  Eyes: EOM are normal.  Neck: Normal range of motion.  Abdominal: Soft.  Drain in place RUQ Bilious drainage - no purulence  seen  Neurological: She is alert and oriented to person, place, and time.  Skin: Skin is warm and dry.  Psychiatric: She has a normal mood and affect. Her behavior is normal. Judgment and thought content normal.    Imaging: Dg Chest 2 View  Result Date: 03/03/2017 CLINICAL DATA:  Status post syncope during dialysis. Acute onset of left-sided and central chest pain. Initial encounter. EXAM: CHEST  2 VIEW COMPARISON:  Chest radiograph performed 02/19/2017 FINDINGS: The lungs are hypoexpanded. No pleural effusion or pneumothorax  is seen. Minimal bilateral atelectasis is noted. The cardiomediastinal silhouette is mildly enlarged. A right-sided dual-lumen catheter is noted ending about the proximal right atrium. An AICD is noted at the left chest wall, with a single lead ending at the right ventricle. No acute osseous abnormalities are identified. IMPRESSION: Lungs hypoexpanded. Minimal bilateral atelectasis seen. Mild cardiomegaly. Electronically Signed   By: Roanna Raider M.D.   On: 03/03/2017 17:51   Dg Chest 2 View  Result Date: 02/16/2017 CLINICAL DATA:  Confusion and hypotension EXAM: CHEST  2 VIEW COMPARISON:  09/16/2016 FINDINGS: Dialysis catheter is noted in the right jugular vein in satisfactory position. This is new from the prior exam. A defibrillator is again seen and stable. Cardiomegaly is again noted. Mild vascular congestion is seen likely of a chronic nature. No focal infiltrate or sizable effusion is seen. IMPRESSION: Mild vascular congestion which may be related to volume overload. No other focal abnormality is seen. Electronically Signed   By: Alcide Clever M.D.   On: 02/16/2017 16:13   Ct Head Wo Contrast  Result Date: 03/03/2017 CLINICAL DATA:  Syncope and seizure while at dialysis today EXAM: CT HEAD WITHOUT CONTRAST TECHNIQUE: Contiguous axial images were obtained from the base of the skull through the vertex without intravenous contrast. COMPARISON:  02/19/2017 FINDINGS: Brain:  Age-appropriate involutional changes. No acute large vascular territory infarction, hemorrhage nor midline shift. No intra-axial mass nor extra-axial fluid. Minimal small vessel ischemic changes of periventricular white matter. No effacement of the basal cisterns. Fourth ventricle is midline. There is no hydrocephalus. Vascular: Right vertebral and bilateral carotid siphon calcifications. No unexpected calcifications are hyperdense vessels. Skull: Normal. Negative for fracture or focal lesion. Sinuses/Orbits:  Intact orbits and globes.  Mild membrane thickening of the ethmoid sinus. Other: Mastoids are clear. IMPRESSION: Chronic stable small vessel ischemic changes of periventricular white matter. No acute intracranial abnormality. Electronically Signed   By: Tollie Eth M.D.   On: 03/03/2017 19:24   Ct Head Wo Contrast  Result Date: 02/19/2017 CLINICAL DATA:  Acute encephalopathy EXAM: CT HEAD WITHOUT CONTRAST TECHNIQUE: Contiguous axial images were obtained from the base of the skull through the vertex without intravenous contrast. COMPARISON:  None. FINDINGS: Brain: Age-appropriate involutional changes. No acute large vascular territory infarction, hemorrhage nor midline shift. No intra-axial mass nor extra-axial fluid. Minimal small vessel ischemic changes of periventricular white matter. No effacement of the basal cisterns. Fourth ventricle is midline. There is no hydrocephalus. Vascular: Right vertebral and bilateral carotid siphon calcifications. No unexpected calcifications are hyperdense vessels. Skull: Normal. Negative for fracture or focal lesion. Sinuses/Orbits: Trace fluid in the sphenoid sinus. Mild membrane thickening of the ethmoid sinus. Other: Mastoids are clear. IMPRESSION: Chronic appearing small vessel ischemic disease of periventricular white matter. No acute intracranial abnormality. Trace sphenoid sinus fluid. Electronically Signed   By: Tollie Eth M.D.   On: 02/19/2017 19:29   Ct  Abdomen Pelvis W Contrast  Result Date: 02/16/2017 CLINICAL DATA:  66 year old female with multiple medical comorbidities status post transhepatic percutaneous cholecystostomy tube on 02/02/2017. Abdominal pain and hypotension. Initial encounter. EXAM: CT ABDOMEN AND PELVIS WITH CONTRAST TECHNIQUE: Multidetector CT imaging of the abdomen and pelvis was performed using the standard protocol following bolus administration of intravenous contrast. CONTRAST:  ISOVUE-300 IOPAMIDOL (ISOVUE-300) INJECTION 61%, however, it appears the vast majority of a contrast dose extravasated into the left upper extremity as seen on coronal image 80. COMPARISON:  CT Abdomen and Pelvis 02/01/2017, and earlier. FINDINGS: Essentially this is  a noncontrast exam may in light of the IV extravasation into the left upper extremity. Lower chest: Left lower chest wall cardiac AICD type device with streak artifact. Severe cardiomegaly. No pericardial effusion. Continued medial left lower lobe atelectasis or consolidation with superimposed calcified granuloma. Interval improved ventilation at the right lung base with residual peribronchial atelectasis. Virtually resolved bilateral small pleural effusions. Hepatobiliary: Transhepatic percutaneous cholecystostomy tube in place with decompressed gallbladder. No biliary ductal enlargement. No discrete liver lesion. Pancreas: Negative. Spleen: Negative. Adrenals/Urinary Tract: Mild bilateral adrenal gland thickening compatible with hyperplasia stable. The kidneys appears stable without hydronephrosis or perinephric stranding. Diminutive, unremarkable urinary bladder. No abdominal free fluid. Stomach/Bowel: Decreased stool ball in the rectum but increased circumferential rectal wall thickening, up to 19 mm (series 21, image 69). Similar retained stool in the sigmoid colon and left colon. Similar retained stool in the transverse colon and right colon. Negative appendix (series 21, image 61). No  dilated small bowel. Negative stomach and duodenum. Vascular/Lymphatic: Extensive Aortoiliac calcified atherosclerosis. Partially visible left femoral region dialysis graft. Poor vascular contrast bolus. Vascular patency not evaluated. Reproductive: Negative. Other: No pelvic free fluid. Rectus muscle diastases with small developing fat and small bowel containing ventral abdominal hernia, non incarcerated. See sagittal image 79. Musculoskeletal: Chronic severe rose to changes throughout the visible lower thoracic spine to the T12 level, not new but progressed since September 2017. However, these bony changes are new since June of 2017. The superior L1 vertebral body now is being eroded, was intact in September. The erosive changes are accompanied by partially visible pedicle fractures at T7. The visible lower ribs remain intact. Severe lower lumbar facet arthropathy. Pelvis and proximal femurs intact. IMPRESSION: 1. Extravasation of the contrast for this study into the patient left upper extremity near the antecubital fossa. Recommend physical exam evaluation of the IV site, elevation of the extremity, and ice application. Recommend surveillance for progressive skin changes, which if detected should prompt plastic surgery consultation. 2. Progressively abnormal appearance of the visualized thoracic spine with unusual vertebral body erosion which has developed since June of last year. New erosion of the superior T1 vertebral body since September. A pathologic fracture of T7 is suspected but incompletely visualized. 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 3. Right transhepatic percutaneous cholecystostomy tube with no adverse features. 4. Decreased rectal fecal impaction but circumferential rectal wall thickening suggestive of proctitis. However, no other bowel inflammation. No bowel obstruction. 5. Resolved small bilateral pleural  effusions since earlier this month. Residual lower lobe atelectasis. 6. Severe cardiomegaly. Extensive calcified aortoiliac atherosclerosis. 7. Salient findings on this exam were discussed by telephone with Dr. Drema Pry on 02/16/2017 at 1930 hours. Electronically Signed   By: Odessa Fleming M.D.   On: 02/16/2017 19:34   Dg Chest Port 1 View  Result Date: 02/19/2017 CLINICAL DATA:  Seizure-like activities.  Dyspnea. EXAM: PORTABLE CHEST 1 VIEW COMPARISON:  02/16/2017 CXR FINDINGS: Stable cardiomegaly with aortic atherosclerosis. Dialysis catheter tip terminates in the right atrium. ICD device projects over the left hemithorax with lead in the expected location of the right ventricle. Interval removal of left IJ central line. No overt pulmonary edema, pulmonary consolidation or pneumothorax. 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 No acute osseous abnormality. IMPRESSION: Stable cardiomegaly with aortic atherosclerosis. No acute pneumonic consolidation or CHF. Electronically Signed   By: Tollie Eth M.D.   On: 02/19/2017 17:43   Dg Chest Portable 1  View  Result Date: 02/16/2017 CLINICAL DATA:  Central line placement.  Initial encounter. EXAM: PORTABLE CHEST 1 VIEW COMPARISON:  Chest radiograph performed earlier today at 4:02 p.m. FINDINGS: The lungs are well-aerated. Mild vascular congestion is noted. Increased interstitial markings may reflect mild interstitial edema. There is no evidence of pleural effusion or pneumothorax. The cardiomediastinal silhouette is enlarged. A right-sided dual-lumen catheter is noted ending at the right atrium. An AICD is noted overlying the left chest wall, with a lead ending overlying the right ventricle. No acute osseous abnormalities are seen. A left IJ line is noted ending about the distal SVC. IMPRESSION: 1. Left IJ line noted ending about the distal SVC. 2. Mild vascular congestion and cardiomegaly noted. Increased interstitial markings raise concern for mild  interstitial edema. Electronically Signed   By: Roanna Raider M.D.   On: 02/16/2017 23:52   Dg Abd Portable 1v  Result Date: 02/20/2017 CLINICAL DATA:  Abdominal pain. EXAM: PORTABLE ABDOMEN - 1 VIEW COMPARISON:  CT 3 days prior FINDINGS: Right upper quadrant cholecystostomy tube remains in place. No evidence of free air on supine views. Mild gaseous gastric distention. No small bowel dilatation to suggest obstruction. Small volume of colonic stool. No radiopaque calculi. Cardiomegaly and pacemaker, partially included. IMPRESSION: No evidence of bowel obstruction. Mild-is gastric distension, nonspecific. Cholecystostomy tube in place. Electronically Signed   By: Rubye Oaks M.D.   On: 02/20/2017 00:26    Labs:  CBC:  Recent Labs  03/01/17 03/03/17 1700 03/04/17 0348 03/05/17 1414  WBC 9.7 14.7* 10.6* 9.1  HGB 8.5* 9.3* 8.7* 9.9*  HCT 29* 31.3* 29.8* 33.6*  PLT 132* 54* 74* 113*    COAGS:  Recent Labs  03/23/16 0413 03/24/16 0415 03/25/16 0500 03/26/16 0400  05/08/16 0436 07/15/16 1331 02/02/17 1558 03/04/17 0851  INR 3.02* 2.08* 2.06* 2.30*  < > 2.62* 1.85 1.43 1.29  APTT 83* 47* 45* 46*  --   --   --   --   --   < > = values in this interval not displayed.  BMP:  Recent Labs  02/22/17 0912 03/01/17 03/03/17 1700 03/04/17 0348 03/05/17 1414  NA 136 142 139 138 137  K 3.3* 4.1 3.0* 3.4* 4.2  CL 99*  --  100* 97* 95*  CO2 27  --  25 29 24   GLUCOSE 111*  --  73 82 115*  BUN 18 44* 22* 26* 39*  CALCIUM 8.2*  --  8.4* 8.4* 8.6*  CREATININE 4.30* 6.0* 4.26* 4.90* 6.22*  GFRNONAA 10*  --  10* 8* 6*  GFRAA 11*  --  12* 10* 7*    LIVER FUNCTION TESTS:  Recent Labs  09/16/16 1232  01/19/17 02/01/17 1924  02/12/17 1913 02/16/17 1644 02/20/17 0845 02/22/17 0912 03/05/17 1414  BILITOT 0.8  --   --  0.6  --  0.3 1.2  --   --   --   AST 17  --  11* 15  --  12* 16  --   --   --   ALT 12*  --  6* 9*  --  7* 7*  --   --   --   ALKPHOS 182*  < > 198* 199*   --  121 141*  --   --   --   PROT 8.7*  --   --  8.3*  --  7.9 8.3*  --   --   --   ALBUMIN 1.9*  --   --  1.8*  < > 1.8* 1.6* 2.1* 1.9* 2.4*  < > = values in this interval not displayed.  TUMOR MARKERS: No results for input(s): AFPTM, CEA, CA199, CHROMGRNA in the last 8760 hours.  Assessment:  Acute cholecystitis = s/p perc chole secondary to high risk surgical candidate due to multiple co-morbidities.  Injection done today and reviewed by Dr.Henn. It appears the ducts are open.  Previous CT reviewed and there does not appear to be any stones.  Will obtain records from her surgeon and verify that she is in fact NOT ever a candidate for Lap Chole.  If not, we will cap her drain for a couple of weeks and make sure she does OK then will plan drain removal at the hospital.   Electronically Signed: Gwynneth Macleod PA-C 03/14/2017, 12:01 PM   Please refer to Dr. Manon Hilding attestation of this note for management and plan.

## 2017-03-27 ENCOUNTER — Encounter (HOSPITAL_COMMUNITY): Payer: Self-pay | Admitting: Emergency Medicine

## 2017-03-27 ENCOUNTER — Emergency Department (HOSPITAL_COMMUNITY): Payer: Medicare PPO

## 2017-03-27 ENCOUNTER — Inpatient Hospital Stay (HOSPITAL_COMMUNITY)
Admission: EM | Admit: 2017-03-27 | Discharge: 2017-04-01 | DRG: 871 | Disposition: A | Discharge status: Hospice | Payer: Medicare PPO | Attending: Internal Medicine | Admitting: Internal Medicine

## 2017-03-27 DIAGNOSIS — N185 Chronic kidney disease, stage 5: Secondary | ICD-10-CM

## 2017-03-27 DIAGNOSIS — Y95 Nosocomial condition: Secondary | ICD-10-CM | POA: Diagnosis present

## 2017-03-27 DIAGNOSIS — Z66 Do not resuscitate: Secondary | ICD-10-CM | POA: Diagnosis not present

## 2017-03-27 DIAGNOSIS — Z882 Allergy status to sulfonamides status: Secondary | ICD-10-CM

## 2017-03-27 DIAGNOSIS — I248 Other forms of acute ischemic heart disease: Secondary | ICD-10-CM | POA: Diagnosis not present

## 2017-03-27 DIAGNOSIS — F329 Major depressive disorder, single episode, unspecified: Secondary | ICD-10-CM | POA: Diagnosis present

## 2017-03-27 DIAGNOSIS — Z95810 Presence of automatic (implantable) cardiac defibrillator: Secondary | ICD-10-CM

## 2017-03-27 DIAGNOSIS — Z91040 Latex allergy status: Secondary | ICD-10-CM

## 2017-03-27 DIAGNOSIS — I428 Other cardiomyopathies: Secondary | ICD-10-CM | POA: Diagnosis present

## 2017-03-27 DIAGNOSIS — E43 Unspecified severe protein-calorie malnutrition: Secondary | ICD-10-CM | POA: Diagnosis present

## 2017-03-27 DIAGNOSIS — Z7189 Other specified counseling: Secondary | ICD-10-CM

## 2017-03-27 DIAGNOSIS — D631 Anemia in chronic kidney disease: Secondary | ICD-10-CM | POA: Diagnosis present

## 2017-03-27 DIAGNOSIS — E11610 Type 2 diabetes mellitus with diabetic neuropathic arthropathy: Secondary | ICD-10-CM | POA: Diagnosis present

## 2017-03-27 DIAGNOSIS — I953 Hypotension of hemodialysis: Secondary | ICD-10-CM | POA: Diagnosis present

## 2017-03-27 DIAGNOSIS — J440 Chronic obstructive pulmonary disease with acute lower respiratory infection: Secondary | ICD-10-CM | POA: Diagnosis present

## 2017-03-27 DIAGNOSIS — J9690 Respiratory failure, unspecified, unspecified whether with hypoxia or hypercapnia: Secondary | ICD-10-CM

## 2017-03-27 DIAGNOSIS — Z79891 Long term (current) use of opiate analgesic: Secondary | ICD-10-CM

## 2017-03-27 DIAGNOSIS — J189 Pneumonia, unspecified organism: Secondary | ICD-10-CM | POA: Diagnosis present

## 2017-03-27 DIAGNOSIS — Z992 Dependence on renal dialysis: Secondary | ICD-10-CM | POA: Diagnosis not present

## 2017-03-27 DIAGNOSIS — Z7951 Long term (current) use of inhaled steroids: Secondary | ICD-10-CM

## 2017-03-27 DIAGNOSIS — I2510 Atherosclerotic heart disease of native coronary artery without angina pectoris: Secondary | ICD-10-CM | POA: Diagnosis present

## 2017-03-27 DIAGNOSIS — D696 Thrombocytopenia, unspecified: Secondary | ICD-10-CM | POA: Diagnosis present

## 2017-03-27 DIAGNOSIS — N2581 Secondary hyperparathyroidism of renal origin: Secondary | ICD-10-CM | POA: Diagnosis present

## 2017-03-27 DIAGNOSIS — E1122 Type 2 diabetes mellitus with diabetic chronic kidney disease: Secondary | ICD-10-CM | POA: Diagnosis present

## 2017-03-27 DIAGNOSIS — R092 Respiratory arrest: Secondary | ICD-10-CM

## 2017-03-27 DIAGNOSIS — E872 Acidosis: Secondary | ICD-10-CM | POA: Diagnosis present

## 2017-03-27 DIAGNOSIS — Z794 Long term (current) use of insulin: Secondary | ICD-10-CM

## 2017-03-27 DIAGNOSIS — I480 Paroxysmal atrial fibrillation: Secondary | ICD-10-CM | POA: Diagnosis present

## 2017-03-27 DIAGNOSIS — Z79899 Other long term (current) drug therapy: Secondary | ICD-10-CM

## 2017-03-27 DIAGNOSIS — R64 Cachexia: Secondary | ICD-10-CM | POA: Diagnosis present

## 2017-03-27 DIAGNOSIS — R1310 Dysphagia, unspecified: Secondary | ICD-10-CM | POA: Diagnosis not present

## 2017-03-27 DIAGNOSIS — J9601 Acute respiratory failure with hypoxia: Secondary | ICD-10-CM | POA: Diagnosis not present

## 2017-03-27 DIAGNOSIS — F40240 Claustrophobia: Secondary | ICD-10-CM | POA: Diagnosis present

## 2017-03-27 DIAGNOSIS — Z515 Encounter for palliative care: Secondary | ICD-10-CM | POA: Diagnosis not present

## 2017-03-27 DIAGNOSIS — A419 Sepsis, unspecified organism: Principal | ICD-10-CM | POA: Diagnosis present

## 2017-03-27 DIAGNOSIS — I132 Hypertensive heart and chronic kidney disease with heart failure and with stage 5 chronic kidney disease, or end stage renal disease: Secondary | ICD-10-CM | POA: Diagnosis present

## 2017-03-27 DIAGNOSIS — E861 Hypovolemia: Secondary | ICD-10-CM | POA: Diagnosis present

## 2017-03-27 DIAGNOSIS — E8889 Other specified metabolic disorders: Secondary | ICD-10-CM | POA: Diagnosis present

## 2017-03-27 DIAGNOSIS — G4733 Obstructive sleep apnea (adult) (pediatric): Secondary | ICD-10-CM | POA: Diagnosis present

## 2017-03-27 DIAGNOSIS — K810 Acute cholecystitis: Secondary | ICD-10-CM | POA: Diagnosis present

## 2017-03-27 DIAGNOSIS — I469 Cardiac arrest, cause unspecified: Secondary | ICD-10-CM | POA: Diagnosis not present

## 2017-03-27 DIAGNOSIS — I5022 Chronic systolic (congestive) heart failure: Secondary | ICD-10-CM | POA: Diagnosis present

## 2017-03-27 DIAGNOSIS — N186 End stage renal disease: Secondary | ICD-10-CM

## 2017-03-27 DIAGNOSIS — L299 Pruritus, unspecified: Secondary | ICD-10-CM | POA: Diagnosis not present

## 2017-03-27 DIAGNOSIS — I340 Nonrheumatic mitral (valve) insufficiency: Secondary | ICD-10-CM | POA: Diagnosis present

## 2017-03-27 DIAGNOSIS — Z6825 Body mass index (BMI) 25.0-25.9, adult: Secondary | ICD-10-CM

## 2017-03-27 DIAGNOSIS — Z86718 Personal history of other venous thrombosis and embolism: Secondary | ICD-10-CM

## 2017-03-27 DIAGNOSIS — R110 Nausea: Secondary | ICD-10-CM | POA: Diagnosis not present

## 2017-03-27 DIAGNOSIS — Z888 Allergy status to other drugs, medicaments and biological substances status: Secondary | ICD-10-CM

## 2017-03-27 DIAGNOSIS — Z87891 Personal history of nicotine dependence: Secondary | ICD-10-CM

## 2017-03-27 DIAGNOSIS — Z955 Presence of coronary angioplasty implant and graft: Secondary | ICD-10-CM

## 2017-03-27 DIAGNOSIS — I342 Nonrheumatic mitral (valve) stenosis: Secondary | ICD-10-CM | POA: Diagnosis not present

## 2017-03-27 DIAGNOSIS — I9589 Other hypotension: Secondary | ICD-10-CM | POA: Diagnosis present

## 2017-03-27 DIAGNOSIS — R011 Cardiac murmur, unspecified: Secondary | ICD-10-CM | POA: Diagnosis present

## 2017-03-27 LAB — COMPREHENSIVE METABOLIC PANEL
ALT: 12 U/L — ABNORMAL LOW (ref 14–54)
AST: 26 U/L (ref 15–41)
Albumin: 2.6 g/dL — ABNORMAL LOW (ref 3.5–5.0)
Alkaline Phosphatase: 145 U/L — ABNORMAL HIGH (ref 38–126)
Anion gap: 19 — ABNORMAL HIGH (ref 5–15)
BUN: 39 mg/dL — ABNORMAL HIGH (ref 6–20)
CO2: 20 mmol/L — ABNORMAL LOW (ref 22–32)
Calcium: 8.1 mg/dL — ABNORMAL LOW (ref 8.9–10.3)
Chloride: 96 mmol/L — ABNORMAL LOW (ref 101–111)
Creatinine, Ser: 6.88 mg/dL — ABNORMAL HIGH (ref 0.44–1.00)
GFR calc Af Amer: 6 mL/min — ABNORMAL LOW (ref >60)
GFR calc non Af Amer: 6 mL/min — ABNORMAL LOW (ref >60)
Glucose, Bld: 79 mg/dL (ref 65–99)
Potassium: 4 mmol/L (ref 3.5–5.1)
Sodium: 135 mmol/L (ref 135–145)
Total Bilirubin: 1.4 mg/dL — ABNORMAL HIGH (ref 0.3–1.2)
Total Protein: 8.6 g/dL — ABNORMAL HIGH (ref 6.5–8.1)

## 2017-03-27 LAB — CBC WITH DIFFERENTIAL/PLATELET
Basophils Absolute: 0 10*3/uL (ref 0.0–0.1)
Basophils Relative: 0 %
Eosinophils Absolute: 0 10*3/uL (ref 0.0–0.7)
Eosinophils Relative: 0 %
HCT: 37.5 % (ref 36.0–46.0)
Hemoglobin: 11.3 g/dL — ABNORMAL LOW (ref 12.0–15.0)
Lymphocytes Relative: 9 %
Lymphs Abs: 1.4 10*3/uL (ref 0.7–4.0)
MCH: 25.1 pg — ABNORMAL LOW (ref 26.0–34.0)
MCHC: 30.1 g/dL (ref 30.0–36.0)
MCV: 83.3 fL (ref 78.0–100.0)
Monocytes Absolute: 0.4 10*3/uL (ref 0.1–1.0)
Monocytes Relative: 3 %
Neutro Abs: 14 10*3/uL — ABNORMAL HIGH (ref 1.7–7.7)
Neutrophils Relative %: 88 %
Platelets: 109 10*3/uL — ABNORMAL LOW (ref 150–400)
RBC: 4.5 MIL/uL (ref 3.87–5.11)
RDW: 20.5 % — ABNORMAL HIGH (ref 11.5–15.5)
WBC: 15.9 10*3/uL — ABNORMAL HIGH (ref 4.0–10.5)

## 2017-03-27 LAB — PROTIME-INR
INR: 1.44
Prothrombin Time: 17.7 seconds — ABNORMAL HIGH (ref 11.4–15.2)

## 2017-03-27 LAB — PROCALCITONIN: Procalcitonin: 1.42 ng/mL

## 2017-03-27 LAB — I-STAT CG4 LACTIC ACID, ED: Lactic Acid, Venous: 2.38 mmol/L (ref 0.5–1.9)

## 2017-03-27 LAB — GLUCOSE, CAPILLARY: Glucose-Capillary: 161 mg/dL — ABNORMAL HIGH (ref 65–99)

## 2017-03-27 LAB — APTT: aPTT: 33 seconds (ref 24–36)

## 2017-03-27 LAB — LACTIC ACID, PLASMA: LACTIC ACID, VENOUS: 1.6 mmol/L (ref 0.5–1.9)

## 2017-03-27 MED ORDER — BOOST PLUS PO LIQD
237.0000 mL | Freq: Every day | ORAL | Status: DC
  Administered 2017-03-27: 237 mL via ORAL
  Filled 2017-03-27 (×2): qty 237

## 2017-03-27 MED ORDER — INSULIN ASPART 100 UNIT/ML ~~LOC~~ SOLN
0.0000 [IU] | Freq: Three times a day (TID) | SUBCUTANEOUS | Status: DC
  Administered 2017-03-28: 3 [IU] via SUBCUTANEOUS
  Administered 2017-03-28: 2 [IU] via SUBCUTANEOUS
  Administered 2017-03-28: 3 [IU] via SUBCUTANEOUS
  Administered 2017-03-29: 1 [IU] via SUBCUTANEOUS
  Administered 2017-03-31: 2 [IU] via SUBCUTANEOUS

## 2017-03-27 MED ORDER — MIRTAZAPINE 7.5 MG PO TABS
7.5000 mg | ORAL_TABLET | Freq: Every day | ORAL | Status: DC
Start: 2017-03-27 — End: 2017-04-01
  Administered 2017-03-27 – 2017-03-31 (×5): 7.5 mg via ORAL
  Filled 2017-03-27 (×6): qty 1

## 2017-03-27 MED ORDER — ATORVASTATIN CALCIUM 10 MG PO TABS
10.0000 mg | ORAL_TABLET | Freq: Every day | ORAL | Status: DC
  Administered 2017-03-27 – 2017-03-29 (×3): 10 mg via ORAL
  Filled 2017-03-27 (×4): qty 1

## 2017-03-27 MED ORDER — ENOXAPARIN SODIUM 30 MG/0.3ML ~~LOC~~ SOLN
30.0000 mg | SUBCUTANEOUS | Status: DC
  Administered 2017-03-27 – 2017-03-28 (×2): 30 mg via SUBCUTANEOUS
  Filled 2017-03-27 (×3): qty 0.3

## 2017-03-27 MED ORDER — INULIN 1.5 G PO CHEW: 2.0000 | CHEWABLE_TABLET | Freq: Every day | ORAL | Status: DC | PRN

## 2017-03-27 MED ORDER — AMIODARONE HCL 200 MG PO TABS
200.0000 mg | ORAL_TABLET | Freq: Every day | ORAL | Status: DC
  Administered 2017-03-28 – 2017-03-30 (×3): 200 mg via ORAL
  Filled 2017-03-27 (×3): qty 1

## 2017-03-27 MED ORDER — CERTAVITE/ANTIOXIDANTS PO TABS: 1.0000 | ORAL_TABLET | Freq: Every day | ORAL | Status: DC

## 2017-03-27 MED ORDER — ALBUTEROL SULFATE (2.5 MG/3ML) 0.083% IN NEBU: 5.0000 mg | INHALATION_SOLUTION | Freq: Once | RESPIRATORY_TRACT | Status: DC

## 2017-03-27 MED ORDER — SODIUM CHLORIDE 0.9 % IV BOLUS (SEPSIS)
500.0000 mL | Freq: Once | INTRAVENOUS | Status: AC
Start: 2017-03-27 — End: 2017-03-27
  Administered 2017-03-27: 500 mL via INTRAVENOUS

## 2017-03-27 MED ORDER — MIDODRINE HCL 5 MG PO TABS
10.0000 mg | ORAL_TABLET | Freq: Three times a day (TID) | ORAL | Status: DC
  Administered 2017-03-28: 10 mg via ORAL
  Filled 2017-03-27: qty 2

## 2017-03-27 MED ORDER — VANCOMYCIN HCL 500 MG IV SOLR: 500.0000 mg | INTRAVENOUS | Status: DC

## 2017-03-27 MED ORDER — HYDROXYZINE HCL 25 MG PO TABS
50.0000 mg | ORAL_TABLET | Freq: Four times a day (QID) | ORAL | Status: DC | PRN
  Administered 2017-03-28 – 2017-04-01 (×3): 50 mg via ORAL
  Filled 2017-03-27: qty 2
  Filled 2017-03-27: qty 1
  Filled 2017-03-27 (×2): qty 2

## 2017-03-27 MED ORDER — DEXTROSE 5 % IV SOLN
2.0000 g | INTRAVENOUS | Status: DC
  Administered 2017-03-29: 2 g via INTRAVENOUS
  Filled 2017-03-27 (×2): qty 2

## 2017-03-27 MED ORDER — SODIUM CHLORIDE 0.9 % IV SOLN
INTRAVENOUS | Status: AC
  Administered 2017-03-27: 21:00:00 via INTRAVENOUS

## 2017-03-27 MED ORDER — VANCOMYCIN HCL IN DEXTROSE 1-5 GM/200ML-% IV SOLN
1000.0000 mg | Freq: Once | INTRAVENOUS | Status: AC
  Administered 2017-03-27: 1000 mg via INTRAVENOUS
  Filled 2017-03-27: qty 200

## 2017-03-27 MED ORDER — RENA-VITE PO TABS
1.0000 | ORAL_TABLET | Freq: Every day | ORAL | Status: DC
  Administered 2017-03-28 – 2017-03-29 (×2): 1 via ORAL
  Filled 2017-03-27 (×4): qty 1

## 2017-03-27 MED ORDER — DEXTROSE 5 % IV SOLN
2.0000 g | Freq: Once | INTRAVENOUS | Status: AC
  Administered 2017-03-27: 2 g via INTRAVENOUS
  Filled 2017-03-27: qty 2

## 2017-03-27 MED ORDER — PROPYLENE GLYCOL 0.6 % OP SOLN
1.0000 [drp] | Freq: Two times a day (BID) | OPHTHALMIC | Status: DC
Start: 2017-03-27 — End: 2017-03-27

## 2017-03-27 MED ORDER — NEPRO/CARBSTEADY PO LIQD
237.0000 mL | Freq: Every day | ORAL | Status: DC
  Administered 2017-03-28: 237 mL via ORAL

## 2017-03-27 MED ORDER — OXYCODONE-ACETAMINOPHEN 5-325 MG PO TABS
1.0000 | ORAL_TABLET | Freq: Four times a day (QID) | ORAL | Status: DC | PRN
  Administered 2017-03-28 – 2017-04-01 (×7): 1 via ORAL
  Filled 2017-03-27 (×7): qty 1

## 2017-03-27 MED ORDER — BUDESONIDE 0.25 MG/2ML IN SUSP
2.0000 mL | Freq: Two times a day (BID) | RESPIRATORY_TRACT | Status: DC
  Administered 2017-03-27 – 2017-03-28 (×2): 0.25 mg via RESPIRATORY_TRACT
  Filled 2017-03-27 (×3): qty 2

## 2017-03-27 MED ORDER — SERTRALINE HCL 50 MG PO TABS
25.0000 mg | ORAL_TABLET | Freq: Every day | ORAL | Status: DC
  Administered 2017-03-27 – 2017-03-31 (×5): 25 mg via ORAL
  Filled 2017-03-27 (×6): qty 1

## 2017-03-27 MED ORDER — FAMOTIDINE 20 MG PO TABS
20.0000 mg | ORAL_TABLET | Freq: Every day | ORAL | Status: DC
  Administered 2017-03-27 – 2017-03-31 (×5): 20 mg via ORAL
  Filled 2017-03-27 (×6): qty 1

## 2017-03-27 MED ORDER — PROMETHAZINE HCL 25 MG PO TABS: 25.0000 mg | ORAL_TABLET | Freq: Four times a day (QID) | ORAL | Status: DC | PRN

## 2017-03-27 NOTE — ED Notes (Signed)
Code Sepsis @ 1751 (RN AWARE)

## 2017-03-27 NOTE — ED Provider Notes (Signed)
[facility]-EMERGENCY DEPT Provider Note   CSN: [id number] Arrival date & time: 03/27/17  1502     History   Chief Complaint Chief Complaint  Patient presents with  . Shortness of Breath    HPI Brandy Dudley is a 66 y.o. female with history CHF, COPD, ESRD with dialysis [facility] who presents today from dialysis with chief complaint cough, shortness of breath, and generalized weakness. She states she started "feeling sick and anxious" while at dialysis today. She received 2 hours of treatment, and states she also missed Saturday dialysis due to feeling ill. She states she has had a cough productive of yellow sputum for one week that seems to be worsening. She denies hemoptysis but endorses some nasal congestion. She has orthopnea at baseline which has not worsened. Denies PND. She states she has achy lateral rib pain due to the cough and pain is only when she coughs. She also endorses nausea and vomiting for 2 days, with multiple episodes of nonbloody and nonbilious vomiting. She has tried Robitussin and nausea medication without improvement. She is currently at a long-term care facility and was recently discharged from the hospital after a syncopal episode during dialysis.  Denies HA, LOC CP, fevers, chills, abd pain, diarrhea, melena, hematochezia, numbness, tingling, back pain  The history is provided by the patient.    Past Medical History:  Diagnosis Date  . AICD (automatic cardioverter/defibrillator) present    Biotronik Inventra 7 VR-T DX 04/12/15  . Anemia   . Anxiety   . C. difficile colitis   . Cardiomyopathy (HCC)    02/02/16 ([facility]): Mixed ischemic and non-ischemic cardiomyopathy  . Charcot's joint of left foot   . CHF (congestive heart failure) (HCC)    systolic  . Cholelithiases 01/2017  . Complication of anesthesia 02/02/2017   has anxeity when awaken with gallbladder surgery  . COPD (chronic obstructive pulmonary disease) (HCC)    occasional home O2  . Coronary artery  disease    2 stents  . Diabetes mellitus (HCC)    Type II  . DVT (deep venous thrombosis) (HCC)    on coumadin  . ESRD (end stage renal disease) (HCC)    tues/thurs/sat dialysis  . Hypertension   . Neuropathy   . Obstructive sleep apnea    no cpap  . Wears glasses     Patient Active Problem List   Diagnosis Date Noted  . HCAP (healthcare-associated pneumonia) 03/27/2017  . Syncope 03/03/2017  . Thrombocytopenia (HCC) 03/03/2017  . PAF (paroxysmal atrial fibrillation) (HCC) 02/27/2017  . Hypotension 02/19/2017  . Shock (HCC) 02/17/2017  . Acute cholecystitis 02/02/2017  . Preoperative clearance   . Protein-calorie malnutrition, severe 07/18/2016  . Positive blood culture 07/17/2016  . Atherosclerotic peripheral vascular disease (HCC) 07/17/2016  . Bacteremia 07/17/2016  . Depression 07/05/2016  . Umbilical hernia without obstruction or gangrene 07/05/2016  . Generalized anxiety disorder 07/05/2016  . Loss of weight 07/05/2016  . Chronic combined systolic and diastolic CHF (congestive heart failure) (HCC) 05/07/2016  . 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 Anemia of chronic renal failure, stage 5 (HCC) 04/14/2016  . ESRD on dialysis (HCC)   . History of Clostridium difficile colitis 03/20/2016  . COPD (chronic obstructive pulmonary disease) (HCC) 03/20/2016  . Obstructive sleep apnea 03/20/2016  . AICD (automatic cardioverter/defibrillator) present 03/20/2016  . Physical deconditioning 03/02/2016  . DVT (deep venous thrombosis), right 03/02/2016  . Cardiomyopathy, ischemic 03/02/2016  . Coronary artery disease involving  native coronary artery of native heart without angina pectoris 03/02/2016    Past Surgical History:  Procedure Laterality Date  . AV FISTULA PLACEMENT Left 04/03/2016   Procedure: ARTERIOVENOUS (AV) FISTULA CREATION;  Surgeon: Larina Earthly, MD;  Location: [facility] OR;  Service: Vascular;  Laterality: Left;  . AV FISTULA  PLACEMENT Right 08/21/2016   Procedure: INSERTION RIGHT ARM ARTERIOVENOUS GRAFT USING 4-7MM X45  CM ACUSEAL GRAFT;  Surgeon: Maeola Harman, MD;  Location: [facility] OR;  Service: Vascular;  Laterality: Right;  . AV FISTULA PLACEMENT Left 02/12/2017   Procedure: INSERTION OF ARTERIOVENOUS (AV) GORE-TEX GRAFT THIGH;  Surgeon: Maeola Harman, MD;  Location: [facility] OR;  Service: Vascular;  Laterality: Left;  Using Goretex 4-11mm Vascular Graft  . CORONARY ANGIOPLASTY     LCX stent 2010; by [facility] 01/2016 note: 03/2014 LHC/RHC: minor luminal irregularities LAD and RCA with patent stent first marginal branch of CX. RA 11, PAP 58/22 with mean 39, mean wedge pressure 19, LVEDP 27, PVR 3.9, CO 5.1, Cardiac index 2.5.   . FISTULA SUPERFICIALIZATION Left 06/16/2016   Procedure: SUPERFICIALIZATION OF LEFT ARM BRACHIOCEPHALIC ARTERIOVENOUS FISTULA;  Surgeon: Chuck Hint, MD;  Location: [facility] OR;  Service: Vascular;  Laterality: Left;  . INSERTION OF DIALYSIS CATHETER Left 04/03/2016   Procedure: INSERTION OF DIALYSIS CATHETER;  Surgeon: Larina Earthly, MD;  Location: [facility] OR;  Service: Vascular;  Laterality: Left;  . IR GENERIC HISTORICAL  02/02/2017   IR PERC CHOLECYSTOSTOMY 02/02/2017 Malachy Moan, MD [facility]-INTERV RAD  . LIGATION OF COMPETING BRANCHES OF ARTERIOVENOUS FISTULA Left 06/16/2016   Procedure: LIGATION OF COMPETING BRANCHES OF left brachiocephalic ARTERIOVENOUS FISTULA;  Surgeon: Chuck Hint, MD;  Location: [facility] OR;  Service: Vascular;  Laterality: Left;  . OTHER SURGICAL HISTORY  02/02/2017   Transhepatic percutaneous cholecystostomy placement  . PERIPHERAL VASCULAR CATHETERIZATION N/A 07/20/2016   Procedure: A/V Shuntogram/Fistulagram;  Surgeon: Fransisco Hertz, MD;  Location: [facility] INVASIVE CV LAB;  Service: Cardiovascular;  Laterality: N/A;  . PERIPHERAL VASCULAR CATHETERIZATION N/A 07/20/2016   Procedure: Dialysis/Perma Catheter Insertion;  Surgeon: Fransisco Hertz, MD;  Location: [facility]  INVASIVE CV LAB;  Service: Cardiovascular;  Laterality: N/A;  . REVISION OF ARTERIOVENOUS GORETEX GRAFT Left 06/16/2016   Procedure: REVISION OF LEFT BRACHIOCEPHALIC ARTERIOVENOUS FISTULA WITH RESECTION OF REDUNDANT SECTION;  Surgeon: Chuck Hint, MD;  Location: [facility] OR;  Service: Vascular;  Laterality: Left;    OB History    No data available       Home Medications    Prior to Admission medications   Medication Sig Start Date End Date Taking? Authorizing Provider  acetaminophen (TYLENOL) 325 MG tablet Take 325 mg by mouth every 6 (six) hours as needed for mild pain or fever (For fever >99.5).    Yes Historical Provider, MD  albuterol (PROAIR HFA) 108 (90 Base) MCG/ACT inhaler Inhale 2 puffs into the lungs every 6 (six) hours as needed for wheezing or shortness of breath.    Yes Historical Provider, MD  amiodarone (PACERONE) 200 MG tablet Take 1 tablet (200 mg total) by mouth daily. 10/11/16  Yes Marinus Maw, MD  atorvastatin (LIPITOR) 10 MG tablet Take 10 mg by mouth at bedtime.    Yes Historical Provider, MD  famotidine (PEPCID) 20 MG tablet Take 20 mg by mouth at bedtime.   Yes Historical Provider, MD  fluticasone (FLOVENT HFA) 110 MCG/ACT inhaler Inhale 2 puffs into the lungs 2 (two) times daily.  Yes Historical Provider, MD  hydrOXYzine (ATARAX/VISTARIL) 50 MG tablet Take 50 mg by mouth every 6 (six) hours as needed for itching.   Yes Historical Provider, MD  insulin lispro (HUMALOG) 100 UNIT/ML injection Inject into the skin 3 (three) times daily as needed for high blood sugar. BGL 0-59 = Initiate hypoglycemic protocol; 60-149 = 0 units; 150-250 = 5 units; 251-300 = 8 units; 301-350 = 8 units; >350 = Call MD   Yes Historical Provider, MD  Inulin (FIBER CHOICE PO) Take 2 tablets by mouth daily as needed (Constipation).    Yes Historical Provider, MD  lactose free nutrition (BOOST PLUS) LIQD Take 237 mLs by mouth daily.    Yes Historical Provider, MD  midodrine (PROAMATINE)  10 MG tablet Take 1 tablet (10 mg total) by mouth 3 (three) times daily with meals. Patient taking differently: Take 10 mg by mouth See admin instructions. AT 0730, 1130, AND 1630 ON NON-DIALYSIS DAYS & AT 0600, 1100, AND 2200 ON DIALYSIS DAYS 03/05/17  Yes Renae Fickle, MD  mirtazapine (REMERON) 15 MG tablet Take 7.5 mg by mouth at bedtime.    Yes Historical Provider, MD  Multiple Vitamins-Minerals (CERTAVITE/ANTIOXIDANTS) TABS Take 1 tablet by mouth at bedtime.   Yes Historical Provider, MD  nitroGLYCERIN (NITROSTAT) 0.4 MG SL tablet Place 0.4 mg under the tongue every 5 (five) minutes as needed for chest pain.   Yes Historical Provider, MD  Nutritional Supplements (ENSURE CLEAR) LIQD Take 237 mLs by mouth daily. For 14 days for cholycystitis poor intake   Yes Historical Provider, MD  oxyCODONE-acetaminophen (PERCOCET/ROXICET) 5-325 MG tablet Take 1 tablet by mouth every 6 (six) hours as needed (for mild pain).    Yes Historical Provider, MD  OXYGEN Inhale 2 L into the lungs as needed (for shortness of breath).    Yes Historical Provider, MD  promethazine (PHENERGAN) 25 MG tablet Take 25 mg by mouth every 6 (six) hours as needed for nausea or vomiting.   Yes Historical Provider, MD  Propylene Glycol (SYSTANE BALANCE) 0.6 % SOLN Place 1 drop into both eyes 2 (two) times daily.   Yes Historical Provider, MD  sertraline (ZOLOFT) 25 MG tablet Take 25 mg by mouth at bedtime.    Yes Historical Provider, MD  tiotropium (SPIRIVA) 18 MCG inhalation capsule Place 1 capsule (18 mcg total) into inhaler and inhale daily. 02/24/17  Yes Penny Pia, MD  vancomycin (VANCOCIN) 125 MG capsule Take 125 mg by mouth 4 (four) times daily.    Historical Provider, MD    Family History History reviewed. No pertinent family history.  Social History Social History  Substance Use Topics  . Smoking status: Former Smoker    Packs/day: 0.50    Years: 35.00    Types: Cigarettes    Quit date: 03/03/2006  . Smokeless  tobacco: Never Used  . Alcohol use No     Allergies   Entresto [sacubitril-valsartan]; Latex; and Sulfa antibiotics   Review of Systems Review of Systems  Constitutional: Negative for chills and fever.  HENT: Positive for congestion.   Respiratory: Positive for cough and shortness of breath.   Cardiovascular: Negative for chest pain.  Gastrointestinal: Positive for nausea and vomiting. Negative for abdominal pain, blood in stool and diarrhea.  Genitourinary: Negative for dysuria and hematuria.  Musculoskeletal: Negative for back pain and neck pain.  Neurological: Negative for dizziness, syncope and headaches.     Physical Exam Updated Vital Signs BP (!) 98/59   Pulse 68  Temp 97.4 F (36.3 C) (Oral)   Resp 15   SpO2 93%   Physical Exam  Constitutional: She is oriented to person, place, and time. No distress.  Chronically ill appearing but NAD  HENT:  Head: Normocephalic and atraumatic.  Right Ear: External ear normal.  Left Ear: External ear normal.  Mouth/Throat: Oropharynx is clear and moist. No oropharyngeal exudate.  Nasal septum midline, with irritation and erosion of the right side, no active bleeding. Mucosa otherwise normal with no increased nasal discharge. No TTP of maxillary or frontal sinuses.  Eyes: Conjunctivae and EOM are normal. Right eye exhibits no discharge. Left eye exhibits no discharge.  Neck: Neck supple. No JVD present. No tracheal deviation present.  Cardiovascular: Normal rate, regular rhythm and intact distal pulses.   2+ radial and DP/PT pulses bl, negative Homan's bl   Pulmonary/Chest: Effort normal. She has wheezes. She has rales. She exhibits tenderness.  Diffuse scattered wheezing and rales, equal rise and fall of chest. Lateral right rib cage TTP with no deformity, crepitus or paradoxical motion of the chest wall.   Abdominal: Soft. Bowel sounds are normal. She exhibits distension. She exhibits no mass. There is no tenderness. There is  no guarding.  Musculoskeletal: She exhibits no tenderness.  Neurological: She is alert and oriented to person, place, and time. No cranial nerve deficit or sensory deficit.  Skin: Skin is warm and dry. Capillary refill takes less than 2 seconds. She is not diaphoretic.  Psychiatric: She has a normal mood and affect. Her behavior is normal.     ED Treatments / Results  Labs (all labs ordered are listed, but only abnormal results are displayed) Labs Reviewed  COMPREHENSIVE METABOLIC PANEL - Abnormal; Notable for the following:       Result Value   Chloride 96 (*)    CO2 20 (*)    BUN 39 (*)    Creatinine, Ser 6.88 (*)    Calcium 8.1 (*)    Total Protein 8.6 (*)    Albumin 2.6 (*)    ALT 12 (*)    Alkaline Phosphatase 145 (*)    Total Bilirubin 1.4 (*)    GFR calc non Af Amer 6 (*)    GFR calc Af Amer 6 (*)    Anion gap 19 (*)    All other components within normal limits  CBC WITH DIFFERENTIAL/PLATELET - Abnormal; Notable for the following:    WBC 15.9 (*)    Hemoglobin 11.3 (*)    MCH 25.1 (*)    RDW 20.5 (*)    Platelets 109 (*)    Neutro Abs 14.0 (*)    All other components within normal limits  I-STAT CG4 LACTIC ACID, ED - Abnormal; Notable for the following:    Lactic Acid, Venous 2.38 (*)    All other components within normal limits  CULTURE, BLOOD (ROUTINE X 2)  CULTURE, BLOOD (ROUTINE X 2)  URINALYSIS, ROUTINE W REFLEX MICROSCOPIC    EKG  EKG Interpretation  Date/Time:  Tuesday Mar 27 2017 15:12:36 EDT Ventricular Rate:  72 PR Interval:    QRS Duration: 110 QT Interval:  442 QTC Calculation: 484 R Axis:   178 Text Interpretation:  Sinus rhythm Probable right ventricular hypertrophy Borderline T abnormalities, lateral leads deeper TWI in lateral leads No STEMI         Confirmed by [facility] MD, PEDRO ([id number]) on 03/27/2017 3:40:43 PM       Radiology Dg Chest 2 View  Result  Date: 03/27/2017 CLINICAL DATA:  Productive cough and midline and left-sided chest  pain for the past week. Former smoker. History of CHF, COPD, end-stage renal disease on dialysis, and coronary artery disease. EXAM: CHEST  2 VIEW COMPARISON:  PA and lateral chest x-ray of March 03, 2017 FINDINGS: The cardiac silhouette remains enlarged. The central pulmonary vascularity is mildly prominent. The interstitial markings are increased especially on the right. There is no alveolar infiltrate. There is no pleural effusion. There is calcification in the wall of the aortic arch. The dialysis catheter tip projects at the cavoatrial junction. The ICD electrode is in reasonable position. IMPRESSION: Improved aeration of both lungs since the previous study. Cardiomegaly with mild pulmonary vascular congestion and mild interstitial edema greatest on the right. No alveolar pneumonia. Thoracic aortic atherosclerosis. Electronically Signed   By: David  [place] M.D.   On: 03/27/2017 15:49    Procedures Procedures (including critical care time)  Medications Ordered in ED Medications  albuterol (PROVENTIL) (2.5 MG/3ML) 0.083% nebulizer solution 5 mg (5 mg Nebulization Not Given 03/27/17 1750)  ceFEPIme (MAXIPIME) 2 g in dextrose 5 % 50 mL IVPB ( Intravenous Canceled Entry 03/27/17 1804)  vancomycin (VANCOCIN) 500 mg in sodium chloride 0.9 % 100 mL IVPB (not administered)  ceFEPIme (MAXIPIME) 2 g in dextrose 5 % 50 mL IVPB (0 g Intravenous Stopped 03/27/17 1916)  vancomycin (VANCOCIN) IVPB 1000 mg/200 mL premix (0 mg Intravenous Stopped 03/27/17 1916)  sodium chloride 0.9 % bolus 500 mL (0 mLs Intravenous Stopped 03/27/17 1916)     Initial Impression / Assessment and Plan / ED Course  I have reviewed the triage vital signs and the nursing notes.  Pertinent labs & imaging results that were available during my care of the patient were reviewed by me and considered in my medical decision making (see chart for details).      Patient with ESRD presents from dialysis with chief complaint productive cough,  shortness of breath, malaise with associated nausea and vomiting. Patient afebrile, but hypotensive on presentation, SPO2 95% on RA.  She has not had a full cycle of dialysis since last Thursday.  No STEMI on EKG, troponin negative. Low suspicion of ACS/MI.   4:56 PM The patient is noted to have a lactate>2. With the current information available to me, I don't think the patient is in septic shock. The lactate>2, is related to hypotension. Patient does have cough but no overt evidence of PNE currently. Will continue to evaluate for infx source, if one is found will initiate code sepsis.   5:46 PM Leukocytosis of 15.9. Chest x-ray with increased interstitial markings especially on the right, possibly indicative of HCAP with recent admission 3 weeks ago. IV vancomycin and cefepime initiated, blood cultures obtained. Hospitalist Dr. Ophelia Charter consulted, and they will assume care and further management.  Final Clinical Impressions(s) / ED Diagnoses   Final diagnoses:  HCAP (healthcare-associated pneumonia)    New Prescriptions New Prescriptions   No medications on file     Jeanie Sewer, PA-C 03/27/17 [address] [place], [place] 03/30/17 [phone]

## 2017-03-27 NOTE — ED Notes (Signed)
Attempted to call report

## 2017-03-27 NOTE — ED Notes (Signed)
PA informed of pt lactic acid resuilts

## 2017-03-27 NOTE — ED Notes (Signed)
Pt to xray

## 2017-03-27 NOTE — Progress Notes (Signed)
Pharmacy Antibiotic Note  Brandy Dudley is a 66 y.o. female admitted on 03/27/2017 with pneumonia.  Pharmacy has been consulted for vancomycin and cefepime dosing. Pt is afebrile and WBC is elevated at 15.9. Lactic acid is elevated at 2.38. Pt only received 2 hours of HD today.   Plan: Vancomycin 1gm IV x 1 then 500mg  post-HD Cefepime 2gm IV x 1 then 2gm post-HD F/u renal plans, C&S, clinical status and pre-HD level when appropriate     Temp (24hrs), Avg:97.5 F (36.4 C), Min:97.5 F (36.4 C), Max:97.5 F (36.4 C)   Recent Labs Lab 03/27/17 1639 03/27/17 1650  WBC 15.9*  --   CREATININE 6.88*  --   LATICACIDVEN  --  2.38*    CrCl cannot be calculated (Unknown ideal weight.).    Allergies  Allergen Reactions  . Entresto [Sacubitril-Valsartan] Other (See Comments)    Reaction not noted on MAR   . Latex Itching  . Sulfa Antibiotics Itching    Antimicrobials this admission: Vanc 5/1>> Cefepime 5/1>>  Dose adjustments this admission: N/A  Microbiology results: Pending  Thank you for allowing pharmacy to be a part of this patient's care.  Ramere Downs, Drake Leach 03/27/2017 5:55 PM

## 2017-03-27 NOTE — ED Triage Notes (Signed)
Per [facility], Brandy Dudley from dialysis, went for about 2 hours of treatment, then stopped treatment and sent here for cough and SOB, feeling weak. Brandy Dudley misssed treatment on Saturday. Brandy Dudley has reported feeling sick x1 week, cough, feeling malaise. Wheezing in upper and rales, given 5mg  albuterol, 125 solu medrol, .5 atrovent, 12 lead unremarkable. AAox4, Brandy Dudley is from [facility].

## 2017-03-27 NOTE — ED Provider Notes (Signed)
Medical screening examination/treatment/procedure(s) were conducted as a shared visit with non-physician practitioner(s) and myself.  I personally evaluated the patient during the encounter. Briefly, the patient is a 66 y.o. female who presented to the ED with shortness of breath and productive cough. Found to have pneumonia on chest x-ray. Elevated lactic acid greater than 2. Soft blood pressures. Did not meet septic shock criteria does at 30 mL/kg of IV fluids were not initiated. However patient was given empiric antibiotics for healthcare acquired pneumonia. She will be admitted for further management.    EKG Interpretation  Date/Time:  Tuesday Mar 27 2017 15:12:36 EDT Ventricular Rate:  72 PR Interval:    QRS Duration: 110 QT Interval:  442 QTC Calculation: 484 R Axis:   178 Text Interpretation:  Sinus rhythm Probable right ventricular hypertrophy Borderline T abnormalities, lateral leads deeper TWI in lateral leads No STEMI         Confirmed by [facility] MD, Dennie Moltz ([id number]) on 03/27/2017 3:40:43 PM           Nira Conn, MD 03/27/17 1751

## 2017-03-27 NOTE — H&P (Addendum)
History and Physical    Brandy Dudley:[id number] DOB: August 21, 1951 DOA: 03/27/2017  PCP: [name], MD Consultants:  Marisue Humble - nephrology Patient coming from: Br[place] Dud[place], [place]; [place]: sisters, [phone]  Chief Complaint: SOB  HPI: Brandy Dudley is a 66 y.o. female with medical history significant of CHF with EF 30%, AICD / pacer present, COPD, DM2 causing ESRD with dialysis TTS with recent admissions (4/7-9 for syncope - hypovolemic during dialysis; 3/23-3/26 for hypovolemic vs. Septic shock; 3/8-13 for acute cholecystitis with resultant placement of IR drain still in place) who presented after hypotension during dialysis today which caused the session to be shortened.  She reports that she was in dialysis and "just couldn't take it".  She vomited throughout the weekend, became scared to eat.  She was able to eat a little last night.  +cough since the weekend, maybe Friday.  Occasional prodcutive of thick yellow phlegm.  No fever/chills.  Had PNA maybe a couple of weeks ago (treated at her facility as an outpatient) and coughed so much before her ribcage that hurt, and it has restarted and is similar to that episode.   ED Course: Low suspicion of ACS, lactate >2 thought to be related to hypotension.  WBC 15.9, CXR concerning for HCAP.  Given Vanc and Cefepime, blood cultures pending.  Review of Systems: As per HPI; otherwise review of systems reviewed and negative.   Ambulatory Status:  Usually ambulates with a cane  Past Medical History:  Diagnosis Date  . AICD (automatic cardioverter/defibrillator) present    Biotronik Inventra 7 VR-T DX 04/12/15  . Anemia   . Anxiety   . C. difficile colitis   . Cardiomyopathy (HCC)    02/02/16 ([facility]): Mixed ischemic and non-ischemic cardiomyopathy  . Charcot's joint of left foot   . CHF (congestive heart failure) (HCC)    systolic  . Cholelithiases 01/2017  . Complication of anesthesia 02/02/2017   has anxeity when awaken with  gallbladder surgery  . COPD (chronic obstructive pulmonary disease) (HCC)    occasional home O2  . Coronary artery disease    2 stents  . Diabetes mellitus (HCC)    Type II  . DVT (deep venous thrombosis) (HCC)    on coumadin  . ESRD (end stage renal disease) (HCC)    tues/thurs/sat dialysis  . Hypertension   . Neuropathy   . Obstructive sleep apnea    no cpap  . Wears glasses     Past Surgical History:  Procedure Laterality Date  . AV FISTULA PLACEMENT Left 04/03/2016   Procedure: ARTERIOVENOUS (AV) FISTULA CREATION;  Surgeon: Larina Earthly, MD;  Location: [facility] OR;  Service: Vascular;  Laterality: Left;  . AV FISTULA PLACEMENT Right 08/21/2016   Procedure: INSERTION RIGHT ARM ARTERIOVENOUS GRAFT USING 4-7MM X45  CM ACUSEAL GRAFT;  Surgeon: Maeola Harman, MD;  Location: [facility] OR;  Service: Vascular;  Laterality: Right;  . AV FISTULA PLACEMENT Left 02/12/2017   Procedure: INSERTION OF ARTERIOVENOUS (AV) GORE-TEX GRAFT THIGH;  Surgeon: Maeola Harman, MD;  Location: [facility] OR;  Service: Vascular;  Laterality: Left;  Using Goretex 4-21mm Vascular Graft  . CORONARY ANGIOPLASTY     LCX stent 2010; by [facility] 01/2016 note: 03/2014 LHC/RHC: minor luminal irregularities LAD and RCA with patent stent first marginal branch of CX. RA 11, PAP 58/22 with mean 39, mean wedge pressure 19, LVEDP 27, PVR 3.9, CO 5.1, Cardiac index 2.5.   . FISTULA SUPERFICIALIZATION Left 06/16/2016   Procedure:  SUPERFICIALIZATION OF LEFT ARM BRACHIOCEPHALIC ARTERIOVENOUS FISTULA;  Surgeon: Chuck Hint, MD;  Location: [facility] OR;  Service: Vascular;  Laterality: Left;  . INSERTION OF DIALYSIS CATHETER Left 04/03/2016   Procedure: INSERTION OF DIALYSIS CATHETER;  Surgeon: Larina Earthly, MD;  Location: [facility] OR;  Service: Vascular;  Laterality: Left;  . IR GENERIC HISTORICAL  02/02/2017   IR PERC CHOLECYSTOSTOMY 02/02/2017 Malachy Moan, MD [facility]-INTERV RAD  . LIGATION OF COMPETING BRANCHES OF ARTERIOVENOUS  FISTULA Left 06/16/2016   Procedure: LIGATION OF COMPETING BRANCHES OF left brachiocephalic ARTERIOVENOUS FISTULA;  Surgeon: Chuck Hint, MD;  Location: [facility] OR;  Service: Vascular;  Laterality: Left;  . OTHER SURGICAL HISTORY  02/02/2017   Transhepatic percutaneous cholecystostomy placement  . PERIPHERAL VASCULAR CATHETERIZATION N/A 07/20/2016   Procedure: A/V Shuntogram/Fistulagram;  Surgeon: Fransisco Hertz, MD;  Location: [facility] INVASIVE CV LAB;  Service: Cardiovascular;  Laterality: N/A;  . PERIPHERAL VASCULAR CATHETERIZATION N/A 07/20/2016   Procedure: Dialysis/Perma Catheter Insertion;  Surgeon: Fransisco Hertz, MD;  Location: [facility] INVASIVE CV LAB;  Service: Cardiovascular;  Laterality: N/A;  . REVISION OF ARTERIOVENOUS GORETEX GRAFT Left 06/16/2016   Procedure: REVISION OF LEFT BRACHIOCEPHALIC ARTERIOVENOUS FISTULA WITH RESECTION OF REDUNDANT SECTION;  Surgeon: Chuck Hint, MD;  Location: [facility] OR;  Service: Vascular;  Laterality: Left;    Social History   Social History  . Marital status: Single    Spouse name: N/A  . Number of children: N/A  . Years of education: N/A   Occupational History  . disabled    Social History Main Topics  . Smoking status: Former Smoker    Packs/day: 0.50    Years: 35.00    Types: Cigarettes    Quit date: 03/03/2006  . Smokeless tobacco: Never Used  . Alcohol use No  . Drug use: No  . Sexual activity: No   Other Topics Concern  . Not on file   Social History Narrative   Admit to [facility] 04/10/16   Never married   Former smoker - 2007   Alcohol none   Full Code    Allergies  Allergen Reactions  . Entresto [Sacubitril-Valsartan] Other (See Comments)    Reaction not noted on MAR   . Latex Itching  . Sulfa Antibiotics Itching    History reviewed. No pertinent family history.  Prior to Admission medications   Medication Sig Start Date End Date Taking? Authorizing Provider  acetaminophen (TYLENOL) 325 MG tablet Take 325 mg by  mouth every 6 (six) hours as needed for mild pain or fever (For fever >99.5).    Yes Historical Provider, MD  albuterol (PROAIR HFA) 108 (90 Base) MCG/ACT inhaler Inhale 2 puffs into the lungs every 6 (six) hours as needed for wheezing or shortness of breath.    Yes Historical Provider, MD  amiodarone (PACERONE) 200 MG tablet Take 1 tablet (200 mg total) by mouth daily. 10/11/16  Yes Marinus Maw, MD  atorvastatin (LIPITOR) 10 MG tablet Take 10 mg by mouth at bedtime.    Yes Historical Provider, MD  famotidine (PEPCID) 20 MG tablet Take 20 mg by mouth at bedtime.   Yes Historical Provider, MD  fluticasone (FLOVENT HFA) 110 MCG/ACT inhaler Inhale 2 puffs into the lungs 2 (two) times daily.    Yes Historical Provider, MD  hydrOXYzine (ATARAX/VISTARIL) 50 MG tablet Take 50 mg by mouth every 6 (six) hours as needed for itching.   Yes Historical Provider, MD  insulin lispro (HUMALOG) 100 UNIT/ML injection  Inject into the skin 3 (three) times daily as needed for high blood sugar. BGL 0-59 = Initiate hypoglycemic protocol; 60-149 = 0 units; 150-250 = 5 units; 251-300 = 8 units; 301-350 = 8 units; >350 = Call MD   Yes Historical Provider, MD  Inulin (FIBER CHOICE PO) Take 2 tablets by mouth daily as needed (Constipation).    Yes Historical Provider, MD  lactose free nutrition (BOOST PLUS) LIQD Take 237 mLs by mouth daily.    Yes Historical Provider, MD  midodrine (PROAMATINE) 10 MG tablet Take 1 tablet (10 mg total) by mouth 3 (three) times daily with meals. Patient taking differently: Take 10 mg by mouth See admin instructions. AT 0730, 1130, AND 1630 ON NON-DIALYSIS DAYS & AT 0600, 1100, AND 2200 ON DIALYSIS DAYS 03/05/17  Yes Renae Fickle, MD  mirtazapine (REMERON) 15 MG tablet Take 7.5 mg by mouth at bedtime.    Yes Historical Provider, MD  Multiple Vitamins-Minerals (CERTAVITE/ANTIOXIDANTS) TABS Take 1 tablet by mouth at bedtime.   Yes Historical Provider, MD  nitroGLYCERIN (NITROSTAT) 0.4 MG SL tablet  Place 0.4 mg under the tongue every 5 (five) minutes as needed for chest pain.   Yes Historical Provider, MD  Nutritional Supplements (ENSURE CLEAR) LIQD Take 237 mLs by mouth daily. For 14 days for cholycystitis poor intake   Yes Historical Provider, MD  oxyCODONE-acetaminophen (PERCOCET/ROXICET) 5-325 MG tablet Take 1 tablet by mouth every 6 (six) hours as needed (for mild pain).    Yes Historical Provider, MD  OXYGEN Inhale 2 L into the lungs as needed (for shortness of breath).    Yes Historical Provider, MD  promethazine (PHENERGAN) 25 MG tablet Take 25 mg by mouth every 6 (six) hours as needed for nausea or vomiting.   Yes Historical Provider, MD  Propylene Glycol (SYSTANE BALANCE) 0.6 % SOLN Place 1 drop into both eyes 2 (two) times daily.   Yes Historical Provider, MD  sertraline (ZOLOFT) 25 MG tablet Take 25 mg by mouth at bedtime.    Yes Historical Provider, MD  tiotropium (SPIRIVA) 18 MCG inhalation capsule Place 1 capsule (18 mcg total) into inhaler and inhale daily. 02/24/17  Yes Penny Pia, MD  vancomycin (VANCOCIN) 125 MG capsule Take 125 mg by mouth 4 (four) times daily.    Historical Provider, MD    Physical Exam: Vitals:   03/27/17 1915 03/27/17 1930 03/27/17 2023 03/27/17 2047  BP: (!) 99/56 (!) 98/59 104/62   Pulse: 69 68 69   Resp: 17 15 19    Temp: 97.4 F (36.3 C)  97.7 F (36.5 C)   TempSrc: Oral  Oral   SpO2: 95% 93% 100% 98%  Weight:   59.3 kg (130 lb 11.2 oz)   Height:   5\' 4"  (1.626 m)      General:  Appears calm and comfortable and is NAD, appears chronically ill Eyes:  PERRL, EOMI, normal lids, iris ENT:  grossly normal hearing, lips & tongue, mmm Neck:  no LAD, masses or thyromegaly Cardiovascular:  RRR, no m/r/g. No LE edema.  Respiratory:  RLL ronchi, otherwise appears to be clear. Normal respiratory effort. Abdomen:  soft, ntnd, NABS; R cholecystostomy drain in place with bilious drainage, no surrounding erythema Skin:  no rash or induration seen on  limited exam Musculoskeletal:  grossly normal tone BUE/BLE, good ROM, no bony abnormality Psychiatric:  grossly normal mood and affect, speech fluent and appropriate, AOx3 Neurologic:  CN 2-12 grossly intact, moves all extremities in coordinated fashion, sensation  intact  Labs on Admission: I have personally reviewed following labs and imaging studies  CBC:  Recent Labs Lab 03/27/17 1639  WBC 15.9*  NEUTROABS 14.0*  HGB 11.3*  HCT 37.5  MCV 83.3  PLT 109*   Basic Metabolic Panel:  Recent Labs Lab 03/27/17 1639  NA 135  K 4.0  CL 96*  CO2 20*  GLUCOSE 79  BUN 39*  CREATININE 6.88*  CALCIUM 8.1*   GFR: Estimated Creatinine Clearance: 6.9 mL/min (A) (by C-G formula based on SCr of 6.88 mg/dL (H)). Liver Function Tests:  Recent Labs Lab 03/27/17 1639  AST 26  ALT 12*  ALKPHOS 145*  BILITOT 1.4*  PROT 8.6*  ALBUMIN 2.6*   No results for input(s): LIPASE, AMYLASE in the last 168 hours. No results for input(s): AMMONIA in the last 168 hours. Coagulation Profile:  Recent Labs Lab 03/27/17 2042  INR 1.44   Cardiac Enzymes: No results for input(s): CKTOTAL, CKMB, CKMBINDEX, TROPONINI in the last 168 hours. BNP (last 3 results) No results for input(s): PROBNP in the last 8760 hours. HbA1C: No results for input(s): HGBA1C in the last 72 hours. CBG:  Recent Labs Lab 03/27/17 2018  GLUCAP 161*   Lipid Profile: No results for input(s): CHOL, HDL, LDLCALC, TRIG, CHOLHDL, LDLDIRECT in the last 72 hours. Thyroid Function Tests: No results for input(s): TSH, T4TOTAL, FREET4, T3FREE, THYROIDAB in the last 72 hours. Anemia Panel: No results for input(s): VITAMINB12, FOLATE, FERRITIN, TIBC, IRON, RETICCTPCT in the last 72 hours. Urine analysis:    Component Value Date/Time   COLORURINE AMBER (A) 07/15/2016 1636   APPEARANCEUR TURBID (A) 07/15/2016 1636   LABSPEC 1.025 07/15/2016 1636   PHURINE 5.0 07/15/2016 1636   GLUCOSEU NEGATIVE 07/15/2016 1636   HGBUR  MODERATE (A) 07/15/2016 1636   BILIRUBINUR MODERATE (A) 07/15/2016 1636   KETONESUR 15 (A) 07/15/2016 1636   PROTEINUR 100 (A) 07/15/2016 1636   NITRITE NEGATIVE 07/15/2016 1636   LEUKOCYTESUR MODERATE (A) 07/15/2016 1636    Creatinine Clearance: Estimated Creatinine Clearance: 6.9 mL/min (A) (by C-G formula based on SCr of 6.88 mg/dL (H)).  Sepsis Labs: @LABRCNTIP (procalcitonin:4,lacticidven:4) )No results found for this or any previous visit (from the past 240 hour(s)).   Radiological Exams on Admission: Dg Chest 2 View  Result Date: 03/27/2017 CLINICAL DATA:  Productive cough and midline and left-sided chest pain for the past week. Former smoker. History of CHF, COPD, end-stage renal disease on dialysis, and coronary artery disease. EXAM: CHEST  2 VIEW COMPARISON:  PA and lateral chest x-ray of March 03, 2017 FINDINGS: The cardiac silhouette remains enlarged. The central pulmonary vascularity is mildly prominent. The interstitial markings are increased especially on the right. There is no alveolar infiltrate. There is no pleural effusion. There is calcification in the wall of the aortic arch. The dialysis catheter tip projects at the cavoatrial junction. The ICD electrode is in reasonable position. IMPRESSION: Improved aeration of both lungs since the previous study. Cardiomegaly with mild pulmonary vascular congestion and mild interstitial edema greatest on the right. No alveolar pneumonia. Thoracic aortic atherosclerosis. Electronically Signed   By: David  [place] M.D.   On: 03/27/2017 15:49    EKG: Independently reviewed.  NSR with rate 72; nonspecific ST changes with no evidence of acute ischemia  Assessment/Plan Principal Problem:   HCAP (healthcare-associated pneumonia) Active Problems:   ESRD on dialysis (HCC)   Anemia of chronic renal failure, stage 5 (HCC)   Protein-calorie malnutrition, severe   Acute cholecystitis  Thrombocytopenia (HCC)   HCAP, possible sepsis -Elevated  WBC count (15.9) with elevated lactate to 2.38, 1.6, 1.2 and borderline hypotension - may be hypovolemia associated with illness and dialysis  -Given productive cough andpossible infiltrate in right lower lobe on chest x-ray, concern is for RLL PNA - which, given h/o HD, would qualify as HCAP -CURB-65 score is 2 - will admit the patient to Med Surg, estimated mortliaty rate is 9.2%. -The patient will need treatment for HCAP; will treat with Cefepime and Vancomycin. -Repeat CBC in am -Sputum cultures -Blood cultures -Strep pneumo testing -Will trend procalcitonin.  >0.5 indicates infection and >>0.5 indicates more serious disease.  As the procalcitonin level normalizes, it will be reasonable to consider de-escalation of antibiotic coverage. -albuterol PRN -Standing Duonebs -Will admit with telemetry and continue to monitor -Will trend lactate to ensure improvement  Cholecystitis -Has perc drain in place since 3/9 -Does not appear to be the source of her infection -It is not clear how this drain is recommended to be in place  Malnutrition -Albumin 2.6, stable  ESRD with anemia -Hgb 11.3, improved -Will need consult for dialysis on Thursday  Thrombocytopenia -Plt 109, stable -If <100, will need to hold Lovenox    DVT prophylaxis: Lovenox  Code Status: Full - confirmed with patient Family Communication: None present Disposition Plan:  Home once clinically improved Consults called: None  Admission status: Admit - It is my clinical opinion that admission to INPATIENT is reasonable and necessary because this patient will require at least 2 midnights in the hospital to treat this condition based on the medical complexity of the problems presented.  Given the aforementioned information, the predictability of an adverse outcome is felt to be significant.    Jonah Blue MD [facility]  If 7PM-7AM, please contact night-coverage [email or web address] Password [email or web address]  03/28/2017, 1:15  AM

## 2017-03-28 DIAGNOSIS — D696 Thrombocytopenia, unspecified: Secondary | ICD-10-CM

## 2017-03-28 DIAGNOSIS — E43 Unspecified severe protein-calorie malnutrition: Secondary | ICD-10-CM

## 2017-03-28 LAB — CBC WITH DIFFERENTIAL/PLATELET
BASOS ABS: 0 10*3/uL (ref 0.0–0.1)
BASOS PCT: 0 %
EOS ABS: 0 10*3/uL (ref 0.0–0.7)
Eosinophils Relative: 0 %
HCT: 34.8 % — ABNORMAL LOW (ref 36.0–46.0)
Hemoglobin: 10.6 g/dL — ABNORMAL LOW (ref 12.0–15.0)
LYMPHS PCT: 7 %
Lymphs Abs: 0.6 10*3/uL — ABNORMAL LOW (ref 0.7–4.0)
MCH: 25.2 pg — AB (ref 26.0–34.0)
MCHC: 30.5 g/dL (ref 30.0–36.0)
MCV: 82.7 fL (ref 78.0–100.0)
MONO ABS: 0.2 10*3/uL (ref 0.1–1.0)
Monocytes Relative: 2 %
NEUTROS ABS: 7.5 10*3/uL (ref 1.7–7.7)
NEUTROS PCT: 91 %
Platelets: 101 10*3/uL — ABNORMAL LOW (ref 150–400)
RBC: 4.21 MIL/uL (ref 3.87–5.11)
RDW: 20.7 % — ABNORMAL HIGH (ref 11.5–15.5)
WBC: 8.3 10*3/uL (ref 4.0–10.5)

## 2017-03-28 LAB — BASIC METABOLIC PANEL
ANION GAP: 18 — AB (ref 5–15)
BUN: 45 mg/dL — ABNORMAL HIGH (ref 6–20)
CALCIUM: 8.3 mg/dL — AB (ref 8.9–10.3)
CO2: 19 mmol/L — AB (ref 22–32)
CREATININE: 7.35 mg/dL — AB (ref 0.44–1.00)
Chloride: 99 mmol/L — ABNORMAL LOW (ref 101–111)
GFR, EST AFRICAN AMERICAN: 6 mL/min — AB (ref >60)
GFR, EST NON AFRICAN AMERICAN: 5 mL/min — AB (ref >60)
GLUCOSE: 224 mg/dL — AB (ref 65–99)
Potassium: 4.8 mmol/L (ref 3.5–5.1)
Sodium: 136 mmol/L (ref 135–145)

## 2017-03-28 LAB — GLUCOSE, CAPILLARY
GLUCOSE-CAPILLARY: 221 mg/dL — AB (ref 65–99)
GLUCOSE-CAPILLARY: 245 mg/dL — AB (ref 65–99)
GLUCOSE-CAPILLARY: 195 mg/dL — AB (ref 65–99)
Glucose-Capillary: 144 mg/dL — ABNORMAL HIGH (ref 65–99)

## 2017-03-28 LAB — MRSA PCR SCREENING: MRSA by PCR: NEGATIVE

## 2017-03-28 LAB — LACTIC ACID, PLASMA: Lactic Acid, Venous: 1.2 mmol/L (ref 0.5–1.9)

## 2017-03-28 MED ORDER — MIDODRINE HCL 5 MG PO TABS
15.0000 mg | ORAL_TABLET | Freq: Three times a day (TID) | ORAL | Status: DC
  Administered 2017-03-28 – 2017-03-30 (×6): 15 mg via ORAL
  Filled 2017-03-28 (×7): qty 3

## 2017-03-28 MED ORDER — IPRATROPIUM-ALBUTEROL 0.5-2.5 (3) MG/3ML IN SOLN: 3.0000 mL | RESPIRATORY_TRACT | Status: DC | PRN

## 2017-03-28 MED ORDER — BOOST PLUS PO LIQD
237.0000 mL | Freq: Two times a day (BID) | ORAL | Status: DC
  Administered 2017-03-29: 237 mL via ORAL
  Filled 2017-03-28 (×7): qty 237

## 2017-03-28 MED ORDER — PRO-STAT SUGAR FREE PO LIQD
30.0000 mL | Freq: Two times a day (BID) | ORAL | Status: DC
  Filled 2017-03-28 (×3): qty 30

## 2017-03-28 MED ORDER — IPRATROPIUM-ALBUTEROL 0.5-2.5 (3) MG/3ML IN SOLN: 3.0000 mL | Freq: Four times a day (QID) | RESPIRATORY_TRACT | Status: DC

## 2017-03-28 MED ORDER — ALBUTEROL SULFATE (2.5 MG/3ML) 0.083% IN NEBU: 2.5000 mg | INHALATION_SOLUTION | RESPIRATORY_TRACT | Status: DC | PRN

## 2017-03-28 MED ORDER — IPRATROPIUM-ALBUTEROL 0.5-2.5 (3) MG/3ML IN SOLN
3.0000 mL | Freq: Three times a day (TID) | RESPIRATORY_TRACT | Status: DC
  Filled 2017-03-28: qty 3

## 2017-03-28 NOTE — Progress Notes (Signed)
New Admission Note:   Arrival Method: From [facility] ED via stretcher Mental Orientation: Alert and oriented x4 Telemetry: Box #11 Assessment: Completed Skin: See doc flowsheet IV: L AC, L Hand Pain: Denies Tubes: Rt Perc drain Safety Measures: Safety Fall Prevention Plan has been given, discussed and signed Admission: Completed [address] Orientation: Patient has been orientated to the room, unit and staff.  Family: None at the bedside  Orders have been reviewed and implemented. Will continue to monitor the patient. Call light has been placed within reach and bed alarm has been activated.   [employer], RN-BC Phone number: [phone]

## 2017-03-28 NOTE — Evaluation (Signed)
Physical Therapy Evaluation Patient Details Name: Brandy Dudley MRN: [id number] DOB: 1950-12-11 Today's Date: 03/28/2017   History of Present Illness   66 y.o. female with medical history significant of CHF with EF 30%, AICD / pacer present, COPD, DM2 causing ESRD with dialysis TTS with recent admissions (4/7-9 for syncope - hypovolemic during dialysis; 3/23-3/26 for hypovolemic vs. Septic shock; 3/8-13 for acute cholecystitis with resultant placement of IR drain still in place) who presented after hypotension during dialysis. Dx of sepsis, HCAP, hypotension.  Clinical Impression  Pt admitted with above diagnosis. Pt currently with functional limitations due to the deficits listed below (see PT Problem List). Mod assist for supine to sit, pt sat on EOB x 10 min, did not transfer 2* pt lightheaded in sitting (BP 101/69).  Pt will benefit from skilled PT to increase their independence and safety with mobility to allow discharge to the venue listed below.       Follow Up Recommendations SNF    Equipment Recommendations  None recommended by PT    Recommendations for Other Services       Precautions / Restrictions Precautions Precautions: Fall Precaution Comments: gallbladder drain; BLEs "gave out"  while getting into ambulance on day of admit, EMS caught her preventing a fall; pt denies other falls in past year Restrictions Weight Bearing Restrictions: No      Mobility  Bed Mobility Overal bed mobility: Needs Assistance Bed Mobility: Supine to Sit;Sit to Supine     Supine to sit: Mod assist Sit to supine: Mod assist   General bed mobility comments: MOd A to raise trunk supine to sit, mod A for BLEs into bed with sit to supine; pt sat on EOB x 10 minutes  Transfers                 General transfer comment: deferred 2* pt dizzy in sitting. BP 101/69 in sitting.   Ambulation/Gait                Stairs            Wheelchair Mobility    Modified Rankin (Stroke  Patients Only)       Balance Overall balance assessment: Needs assistance Sitting-balance support: Feet unsupported;Single extremity supported Sitting balance-Leahy Scale: Fair Sitting balance - Comments: pt sat on EOB x 10 min                                     Pertinent Vitals/Pain Pain Assessment: No/denies pain    Home Living Family/patient expects to be discharged to:: Skilled nursing facility                      Prior Function Level of Independence: Needs assistance   Gait / Transfers Assistance Needed: staff assists with WC transfers, hasn't walked in about 6 weeks (since onset of cholecystitis), can self propel WC to dining room  ADL's / Homemaking Assistance Needed: assist for bathing/dressing at [facility]        Hand Dominance   Dominant Hand: Right    Extremity/Trunk Assessment   Upper Extremity Assessment Upper Extremity Assessment: Defer to OT evaluation    Lower Extremity Assessment Lower Extremity Assessment: Generalized weakness (B knee ext -4/5, decr sensation to light touch B feet)    Cervical / Trunk Assessment Cervical / Trunk Assessment: Normal  Communication   Communication: No difficulties  Cognition Arousal/Alertness:  Awake/alert Behavior During Therapy: WFL for tasks assessed/performed Overall Cognitive Status: Within Functional Limits for tasks assessed                                        General Comments      Exercises     Assessment/Plan    PT Assessment Patient needs continued PT services  PT Problem List Decreased strength;Decreased activity tolerance;Cardiopulmonary status limiting activity;Decreased mobility       PT Treatment Interventions Functional mobility training;Balance training;Therapeutic exercise;Patient/family education;Therapeutic activities    PT Goals (Current goals can be found in the Care Plan section)  Acute Rehab PT Goals Patient Stated Goal: to get back  to walking PT Goal Formulation: With patient Time For Goal Achievement: 04/11/17 Potential to Achieve Goals: Good    Frequency Min 3X/week   Barriers to discharge        Co-evaluation               AM-PAC PT "6 Clicks" Daily Activity  Outcome Measure Difficulty turning over in bed (including adjusting bedclothes, sheets and blankets)?: A Lot Difficulty moving from lying on back to sitting on the side of the bed? : A Lot Difficulty sitting down on and standing up from a chair with arms (e.g., wheelchair, bedside commode, etc,.)?: Total Help needed moving to and from a bed to chair (including a wheelchair)?: Total Help needed walking in hospital room?: Total Help needed climbing 3-5 steps with a railing? : Total 6 Click Score: 8    End of Session   Activity Tolerance: Patient limited by fatigue Patient left: in bed;with call Rausch/phone within reach Nurse Communication: Mobility status PT Visit Diagnosis: Unsteadiness on feet (R26.81);Difficulty in walking, not elsewhere classified (R26.2);Muscle weakness (generalized) (M62.81)    Time: 9562-1308 PT Time Calculation (min) (ACUTE ONLY): 24 min   Charges:   PT Evaluation $PT Eval Low Complexity: 1 Procedure PT Treatments $Therapeutic Activity: 8-22 mins   PT G Codes:          Tamala Ser 03/28/2017, 3:08 PM [phone]

## 2017-03-28 NOTE — Consult Note (Signed)
[facility] Renal Consultation Note    Indication for Consultation:  Management of ESRD/hemodialysis; anemia, hypertension/volume and secondary hyperparathyroidism PCP:  HPI: Brandy Dudley is a delightful 66 y.o. female with ESRD 2/2 cardiorenal syndrome on hemodialysis T,Th,S at [facility]. PMH significant for DMT2, HTN, CAD, Systolic HF, Cardiomyopathy with AICD, DVT, PAF,  (not a candidate for coumadin)C diff colitis, anemia of chronic disease, SPTH, acute cholecystitis with perc drain placement per IR.   Brandy Dudley was sent from hemodialysis yesterday with hypotension and vomiting. Patient states that she starting vomiting over weekend, felt weak with mild SOB. Says she did not have fever, but progressively felt worse. Says that she never misses dialysis (which is true) but says that she just couldn't "take it anymore" and was sent from HD unit to ED for evaluation. Upon arrival to ED, T-97.5 BP 115/53 HR 72 O2 Sats 99%. WBC 15.9 HGB 11.3 PLT 109 Lactic acid 2.38. CXR showed mild pulmonary vascular congestion and mild interstitial edema greater in R lung. Blood cultures were done and patient has been started on vanc/cefepime per primary for HCAP/possible sepsis. MRSA by PCR is negative.  Patient is awake, alert, oriented X 3. Says she feels better, has stopped vomiting. Says she has occasional non-productive cough. She appears comfortable on RA at present. No C/Os.   Past Medical History:  Diagnosis Date  . AICD (automatic cardioverter/defibrillator) present    Biotronik Inventra 7 VR-T DX 04/12/15  . Anemia   . Anxiety   . C. difficile colitis   . Cardiomyopathy (HCC)    02/02/16 ([facility]): Mixed ischemic and non-ischemic cardiomyopathy  . Charcot's joint of left foot   . CHF (congestive heart failure) (HCC)    systolic  . Cholelithiases 01/2017  . Complication of anesthesia 02/02/2017   has anxeity when awaken with gallbladder surgery  . COPD (chronic  obstructive pulmonary disease) (HCC)    occasional home O2  . Coronary artery disease    2 stents  . Diabetes mellitus (HCC)    Type II  . DVT (deep venous thrombosis) (HCC)    on coumadin  . ESRD (end stage renal disease) (HCC)    tues/thurs/sat dialysis  . Hypertension   . Neuropathy   . Obstructive sleep apnea    no cpap  . Wears glasses    Past Surgical History:  Procedure Laterality Date  . AV FISTULA PLACEMENT Left 04/03/2016   Procedure: ARTERIOVENOUS (AV) FISTULA CREATION;  Surgeon: Larina Earthly, MD;  Location: [facility] OR;  Service: Vascular;  Laterality: Left;  . AV FISTULA PLACEMENT Right 08/21/2016   Procedure: INSERTION RIGHT ARM ARTERIOVENOUS GRAFT USING 4-7MM X45  CM ACUSEAL GRAFT;  Surgeon: Maeola Harman, MD;  Location: [facility] OR;  Service: Vascular;  Laterality: Right;  . AV FISTULA PLACEMENT Left 02/12/2017   Procedure: INSERTION OF ARTERIOVENOUS (AV) GORE-TEX GRAFT THIGH;  Surgeon: Maeola Harman, MD;  Location: [facility] OR;  Service: Vascular;  Laterality: Left;  Using Goretex 4-7mm Vascular Graft  . CORONARY ANGIOPLASTY     LCX stent 2010; by [facility] 01/2016 note: 03/2014 LHC/RHC: minor luminal irregularities LAD and RCA with patent stent first marginal branch of CX. RA 11, PAP 58/22 with mean 39, mean wedge pressure 19, LVEDP 27, PVR 3.9, CO 5.1, Cardiac index 2.5.   . FISTULA SUPERFICIALIZATION Left 06/16/2016   Procedure: SUPERFICIALIZATION OF LEFT ARM BRACHIOCEPHALIC ARTERIOVENOUS FISTULA;  Surgeon: Chuck Hint, MD;  Location: [facility] OR;  Service:  Vascular;  Laterality: Left;  . INSERTION OF DIALYSIS CATHETER Left 04/03/2016   Procedure: INSERTION OF DIALYSIS CATHETER;  Surgeon: Larina Earthly, MD;  Location: [facility] OR;  Service: Vascular;  Laterality: Left;  . IR GENERIC HISTORICAL  02/02/2017   IR PERC CHOLECYSTOSTOMY 02/02/2017 Brandy Moan, MD [facility]-INTERV RAD  . LIGATION OF COMPETING BRANCHES OF ARTERIOVENOUS FISTULA Left 06/16/2016   Procedure:  LIGATION OF COMPETING BRANCHES OF left brachiocephalic ARTERIOVENOUS FISTULA;  Surgeon: Chuck Hint, MD;  Location: [facility] OR;  Service: Vascular;  Laterality: Left;  . OTHER SURGICAL HISTORY  02/02/2017   Transhepatic percutaneous cholecystostomy placement  . PERIPHERAL VASCULAR CATHETERIZATION N/A 07/20/2016   Procedure: A/V Shuntogram/Fistulagram;  Surgeon: Fransisco Hertz, MD;  Location: [facility] INVASIVE CV LAB;  Service: Cardiovascular;  Laterality: N/A;  . PERIPHERAL VASCULAR CATHETERIZATION N/A 07/20/2016   Procedure: Dialysis/Perma Catheter Insertion;  Surgeon: Fransisco Hertz, MD;  Location: [facility] INVASIVE CV LAB;  Service: Cardiovascular;  Laterality: N/A;  . REVISION OF ARTERIOVENOUS GORETEX GRAFT Left 06/16/2016   Procedure: REVISION OF LEFT BRACHIOCEPHALIC ARTERIOVENOUS FISTULA WITH RESECTION OF REDUNDANT SECTION;  Surgeon: Chuck Hint, MD;  Location: [facility] OR;  Service: Vascular;  Laterality: Left;   History reviewed. No pertinent family history. Social History:  reports that she quit smoking about 11 years ago. Her smoking use included Cigarettes. She has a 17.50 pack-year smoking history. She has never used smokeless tobacco. She reports that she does not drink alcohol or use drugs. Allergies  Allergen Reactions  . Entresto [Sacubitril-Valsartan] Other (See Comments)    Reaction not noted on MAR   . Latex Itching  . Sulfa Antibiotics Itching   Prior to Admission medications   Medication Sig Start Date End Date Taking? Authorizing Provider  acetaminophen (TYLENOL) 325 MG tablet Take 325 mg by mouth every 6 (six) hours as needed for mild pain or fever (For fever >99.5).    Yes Historical Provider, MD  albuterol (PROAIR HFA) 108 (90 Base) MCG/ACT inhaler Inhale 2 puffs into the lungs every 6 (six) hours as needed for wheezing or shortness of breath.    Yes Historical Provider, MD  amiodarone (PACERONE) 200 MG tablet Take 1 tablet (200 mg total) by mouth daily. 10/11/16  Yes Marinus Maw, MD  atorvastatin (LIPITOR) 10 MG tablet Take 10 mg by mouth at bedtime.    Yes Historical Provider, MD  famotidine (PEPCID) 20 MG tablet Take 20 mg by mouth at bedtime.   Yes Historical Provider, MD  fluticasone (FLOVENT HFA) 110 MCG/ACT inhaler Inhale 2 puffs into the lungs 2 (two) times daily.    Yes Historical Provider, MD  hydrOXYzine (ATARAX/VISTARIL) 50 MG tablet Take 50 mg by mouth every 6 (six) hours as needed for itching.   Yes Historical Provider, MD  insulin lispro (HUMALOG) 100 UNIT/ML injection Inject into the skin 3 (three) times daily as needed for high blood sugar. BGL 0-59 = Initiate hypoglycemic protocol; 60-149 = 0 units; 150-250 = 5 units; 251-300 = 8 units; 301-350 = 8 units; >350 = Call MD   Yes Historical Provider, MD  Inulin (FIBER CHOICE PO) Take 2 tablets by mouth daily as needed (Constipation).    Yes Historical Provider, MD  lactose free nutrition (BOOST PLUS) LIQD Take 237 mLs by mouth daily.    Yes Historical Provider, MD  midodrine (PROAMATINE) 10 MG tablet Take 1 tablet (10 mg total) by mouth 3 (three) times daily with meals. Patient taking differently: Take 10 mg by mouth  See admin instructions. AT 0730, 1130, AND 1630 ON NON-DIALYSIS DAYS & AT 0600, 1100, AND 2200 ON DIALYSIS DAYS 03/05/17  Yes Renae Fickle, MD  mirtazapine (REMERON) 15 MG tablet Take 7.5 mg by mouth at bedtime.    Yes Historical Provider, MD  Multiple Vitamins-Minerals (CERTAVITE/ANTIOXIDANTS) TABS Take 1 tablet by mouth at bedtime.   Yes Historical Provider, MD  nitroGLYCERIN (NITROSTAT) 0.4 MG SL tablet Place 0.4 mg under the tongue every 5 (five) minutes as needed for chest pain.   Yes Historical Provider, MD  Nutritional Supplements (ENSURE CLEAR) LIQD Take 237 mLs by mouth daily. For 14 days for cholycystitis poor intake   Yes Historical Provider, MD  oxyCODONE-acetaminophen (PERCOCET/ROXICET) 5-325 MG tablet Take 1 tablet by mouth every 6 (six) hours as needed (for mild pain).    Yes  Historical Provider, MD  OXYGEN Inhale 2 L into the lungs as needed (for shortness of breath).    Yes Historical Provider, MD  promethazine (PHENERGAN) 25 MG tablet Take 25 mg by mouth every 6 (six) hours as needed for nausea or vomiting.   Yes Historical Provider, MD  Propylene Glycol (SYSTANE BALANCE) 0.6 % SOLN Place 1 drop into both eyes 2 (two) times daily.   Yes Historical Provider, MD  sertraline (ZOLOFT) 25 MG tablet Take 25 mg by mouth at bedtime.    Yes Historical Provider, MD  tiotropium (SPIRIVA) 18 MCG inhalation capsule Place 1 capsule (18 mcg total) into inhaler and inhale daily. 02/24/17  Yes Penny Pia, MD  vancomycin (VANCOCIN) 125 MG capsule Take 125 mg by mouth 4 (four) times daily.    Historical Provider, MD   Current Facility-Administered Medications  Medication Dose Route Frequency Provider Last Rate Last Dose  . albuterol (PROVENTIL) (2.5 MG/3ML) 0.083% nebulizer solution 2.5 mg  2.5 mg Nebulization Q2H PRN Jonah Blue, MD      . amiodarone (PACERONE) tablet 200 mg  200 mg Oral Daily Jonah Blue, MD   200 mg at 03/28/17 1006  . atorvastatin (LIPITOR) tablet 10 mg  10 mg Oral QHS Jonah Blue, MD   10 mg at 03/27/17 2111  . budesonide (PULMICORT) nebulizer solution 0.25 mg  2 mL Inhalation BID Jonah Blue, MD   0.25 mg at 03/27/17 2047  . ceFEPIme (MAXIPIME) 2 g in dextrose 5 % 50 mL IVPB  2 g Intravenous Q T,Th,Sat-1800 Rachel L Rumbarger, RPH      . enoxaparin (LOVENOX) injection 30 mg  30 mg Subcutaneous Q24H Jonah Blue, MD   30 mg at 03/27/17 2113  . famotidine (PEPCID) tablet 20 mg  20 mg Oral QHS Jonah Blue, MD   20 mg at 03/27/17 2110  . feeding supplement (NEPRO CARB STEADY) liquid 237 mL  237 mL Oral Daily Jonah Blue, MD   237 mL at 03/28/17 1000  . hydrOXYzine (ATARAX/VISTARIL) tablet 50 mg  50 mg Oral Q6H PRN Jonah Blue, MD      . insulin aspart (novoLOG) injection 0-9 Units  0-9 Units Subcutaneous TID WC Jonah Blue, MD   3 Units at  03/28/17 1215  . ipratropium-albuterol (DUONEB) 0.5-2.5 (3) MG/3ML nebulizer solution 3 mL  3 mL Nebulization Q4H PRN Renae Fickle, MD      . lactose free nutrition (BOOST PLUS) liquid 237 mL  237 mL Oral Q2200 Jonah Blue, MD   237 mL at 03/27/17 2112  . midodrine (PROAMATINE) tablet 15 mg  15 mg Oral TID WC Renae Fickle, MD   15 mg at 03/28/17  1215  . mirtazapine (REMERON) tablet 7.5 mg  7.5 mg Oral QHS Jonah Blue, MD   7.5 mg at 03/27/17 2111  . multivitamin (RENA-VIT) tablet 1 tablet  1 tablet Oral QHS Jonah Blue, MD      . oxyCODONE-acetaminophen (PERCOCET/ROXICET) 5-325 MG per tablet 1 tablet  1 tablet Oral Q6H PRN Jonah Blue, MD      . promethazine (PHENERGAN) tablet 25 mg  25 mg Oral Q6H PRN Jonah Blue, MD      . sertraline (ZOLOFT) tablet 25 mg  25 mg Oral QHS Jonah Blue, MD   25 mg at 03/27/17 2111   Labs: Basic Metabolic Panel:  Recent Labs Lab 03/27/17 1639 03/28/17 0656  NA 135 136  K 4.0 4.8  CL 96* 99*  CO2 20* 19*  GLUCOSE 79 224*  BUN 39* 45*  CREATININE 6.88* 7.35*  CALCIUM 8.1* 8.3*   Liver Function Tests:  Recent Labs Lab 03/27/17 1639  AST 26  ALT 12*  ALKPHOS 145*  BILITOT 1.4*  PROT 8.6*  ALBUMIN 2.6*   No results for input(s): LIPASE, AMYLASE in the last 168 hours. No results for input(s): AMMONIA in the last 168 hours. CBC:  Recent Labs Lab 03/27/17 1639 03/28/17 0656  WBC 15.9* 8.3  NEUTROABS 14.0* 7.5  HGB 11.3* 10.6*  HCT 37.5 34.8*  MCV 83.3 82.7  PLT 109* 101*   Cardiac Enzymes: No results for input(s): CKTOTAL, CKMB, CKMBINDEX, TROPONINI in the last 168 hours. CBG:  Recent Labs Lab 03/27/17 2018 03/28/17 0756 03/28/17 1206  GLUCAP 161* 221* 245*   Iron Studies: No results for input(s): IRON, TIBC, TRANSFERRIN, FERRITIN in the last 72 hours. Studies/Results: Dg Chest 2 View  Result Date: 03/27/2017 CLINICAL DATA:  Productive cough and midline and left-sided chest pain for the past week.  Former smoker. History of CHF, COPD, end-stage renal disease on dialysis, and coronary artery disease. EXAM: CHEST  2 VIEW COMPARISON:  PA and lateral chest x-ray of March 03, 2017 FINDINGS: The cardiac silhouette remains enlarged. The central pulmonary vascularity is mildly prominent. The interstitial markings are increased especially on the right. There is no alveolar infiltrate. There is no pleural effusion. There is calcification in the wall of the aortic arch. The dialysis catheter tip projects at the cavoatrial junction. The ICD electrode is in reasonable position. IMPRESSION: Improved aeration of both lungs since the previous study. Cardiomegaly with mild pulmonary vascular congestion and mild interstitial edema greatest on the right. No alveolar pneumonia. Thoracic aortic atherosclerosis. Electronically Signed   By: David  [place] M.D.   On: 03/27/2017 15:49    ROS: As per HPI otherwise negative.   Physical Exam: Vitals:   03/27/17 2023 03/27/17 2047 03/28/17 0517 03/28/17 1000  BP: 104/62  (!) 108/56 (!) 112/59  Pulse: 69  81 77  Resp: 19  20 20   Temp: 97.7 F (36.5 C)  98.6 F (37 C) 98.4 F (36.9 C)  TempSrc: Oral  Oral Oral  SpO2: 100% 98% 100% 100%  Weight: 59.3 kg (130 lb 11.2 oz)     Height: 5\' 4"  (1.626 m)        General: Pleasant, frail appearing,elderly female in NAD Head: Normocephalic, atraumatic, sclera non-icteric, mucus membranes are moist Neck: Supple. JVD not elevated. Lungs: BBS decreased in bases R>L. Few bibasilar crackles. No WOB. On RA.  Heart: RRR with S1 S2. 2/6 systolic M.  Abdomen: Soft, non-tender, non-distended with normoactive bowel sounds. No rebound/guarding. No obvious abdominal masses. R  Perc cholecystostomy tube with bilious drainage.  M-S:  Strength and tone appear normal for age. Lower extremities:without edema or ischemic changes, no open wounds  Neuro: Alert and oriented X 3. Moves all extremities spontaneously. Psych:  Responds to questions  appropriately with a normal affect. Dialysis Access: [facility] dr[facility] CDI. L thigh AVG-concerned that bruit is faint, + thrill. Placed 02/12/17 per Dr. Randie Heinz.   Dialysis Orders: [place] T,Th,S 4 hrs 180 NRe 60kg  400/800 3.0 K/2.5 Ca UF profile 2 Heparin 6000 units IV TIW Mircera 150 mcg IV q 2 weeks (last dose 03/27/17 HGB 10.7 WBC 11.3 Ferritin 1374 Fe 57 Tsat 24% 03/22/17) No VDRA  BMD meds: None  Assessment/Plan: 1.  Sepsis/HCAP: BC NGTD 24 hours. Vanc has been stopped, cont. Cefepime. WBC down to 8.3. Afebrile, feels better. 2.  ESRD -  T,Th,S. HD tomorrow on schedule. K+ 4.8-2.0 K bath. Usual dose heparin.  3.  Hypotension/volume  - Metoprolol has been DC'd. SBP 104-112. Midodrine increased to 15 mg PO TID. Pt left HD 0.9 kg under EDW last treatment. Wt 59.3 which is consistent with post wt from HD yesterday. No peripheral edema, no evidence of volume overload. Challenge and lower EDW 0.5 kg in HD tomorrow if BP will tolerate.  4.  Anemia  - HGB 10.6 Recent ESA dose. Follow HGB.  5.  Metabolic bone disease -  No binders/VDRA. Ca 8.3 C Ca 9.22  6.  Nutrition - Severe PCM with 35 lb weight loss past 5 months. Albumin 2.6. Dys 3 diet. Add prostat/renal vit 7.  Cholecystitis: Perc tube to remain for 2 more weeks. Per primary 8.  DM: Per primary 9.  H/O systolic HF: Monitor volume status. Lower as appropriate. 10. H/O PAF. Off amiodarone. No coumadin.   Rita H. Manson Passey, NP-C 03/28/2017, 2:15 PM  [employer] [phone]  Pt seen, examined and agree w A/P as above. ESRD pt w malaise, sig coughing w pleuritic rib/ chest pain w coughing. Admitted for Rx of pneumonia.  Will plan on HD tomorrow.  No vol/ electrolyte issues.  Vinson Moselle MD [employer] pager [phone]   03/28/2017, 4:45 PM

## 2017-03-28 NOTE — Consult Note (Signed)
Consultation Note Date: 03/28/2017   Patient Name: Brandy Dudley  DOB: 1951/08/03  MRN: [id number]  Age / Sex: 66 y.o., female  PCP: Blanchie Serve, MD Referring Physician: Janece Canterbury, MD  Reason for Consultation: Disposition, Establishing goals of care and Psychosocial/spiritual support  HPI/Patient Profile: 66 y.o. female  with past medical history of CHF with EF 30%, AICD/pacer, A. Fib, COPD, right Charcot foot, DM2, and ESRD on dialysis. She has had multiple admissions in the past six months, with the most recent being: 4/7-4/9 syncope during dialysis r/t hypotension; 3/23-3/26 hypotension vs septic shock;  3/8-3/13 acute cholecystitis with IR drain placed. She now presented to the ED with hypotension during dialysis, as well as N/V and cough. She was admitted on 03/27/2017 with sepsis from HCAP and hypotension. On 5/3 during dialysis she became unresponsive with progression to PEA arrest. She was resuscitated with CPR and Epi. Palliative was consulted to assist in goals of care given her multiple admissions, poor tolerance of dialysis, and in light of her failure to thrive with significant weight loss (216lb June 2017, 163lb Nov 2017, 130lb May 2018).  Clinical Assessment and Goals of Care: I met Brandy Dudley at her bedside. She was now in the ICU after she experienced an acute PEA arrest during dialysis. She was fully alert, oriented, and interactive during our meeting. No family is at the bedside, however her sister Brandy Dudley is planning to come to the hospital today. She has 8 siblings living, but feels Brandy Dudley would be the best person for making medical decisions for her if she were unable. She did want to fill out HCPOA paperwork to formalize this decision.  Ms. Chisum and I had a good conversation about her health. She felt things were relatively unchanged until early March, which is when she was admitted for acute cholecystitis and had a drain placed. Since that point  she feels her health has been declining and she has been unable to consistently tolerate dialysis. She does note a persistent weight loss over the past year, however feels it is due to poor food quality and not related to her health issues. With her frequent hospitalization, and now more strongly with the PEA and need for CPR, she is becoming increasingly fearful of dying. She derives immense joy from her close relationships with her extensive family and friends, and is scared of leaving them.   As we talked through her fears she was very vocal about the importance of listening to those around her and seeking out God's path. She was able to hear my concern about her health declining, and asked for time to think about what she wants her life to look like for whatever time she has left. I explained that dialysis may or may not be an option based on her poor tolerance, which she understood. She plans to think on these things more this morning, and wants to meet again when her sister Brandy Dudley arrives.   UPDATE: I was able to be present during a conversation between Nephrology (Dr. Jonnie Finner), Brandy Dudley, and her sister Brandy Dudley. Dr. Jonnie Finner explained the two options moving forward: continue to attempt dialysis versus stopping dialysis. He detailed the benefits and burdens of each option, but recommended not pursuing dialysis in light of her progressively poor tolerance. I followed-up that conversation by detailing those two options further. We talked about the risk of sudden death with dialysis (such as a repeat of this morning but with no plan for resuscitation). With this option I worried  that she would not have adequate time to talk with her family, say her goodbyes, and get her affairs in order. If dialysis were stopped, I explained that the time would be short--likely 1-2 weeks--but she could have a chance to be with her family during this time. Her family is currently driving and making plans to fly in to be here (arriving  Friday and Saturday). They plan to sit down together and discuss the options and support Brandy Dudley in making a decision. Brandy Dudley asked that Palliative support their conversations and be available to help with the many questions/concerns they may have.   Primary Decision Maker PATIENT   SUMMARY OF RECOMMENDATIONS    DNR, turn of ICD (which I discussed with Brandy Dudley)  Chaplain consulted to assist in HCPOA paperwork  Palliative to continue to follow and support. Continue current level of care with treating the treatable. Decision on next steps to occur once family arrives, which is expected by Saturday at the latest.  Code Status/Advance Care Planning:  DNR  Palliative Prophylaxis:   Frequent Pain Assessment  Additional Recommendations (Limitations, Scope, Preferences):  Continue the current level of care, family arriving to support pt in determining path forward (dialysis versus stopping dialysis)  Psycho-social/Spiritual:   Desire for further Chaplaincy support:yes  Additional Recommendations: TBD  Prognosis:   < 6 months  Discharge Planning: To Be Determined      Primary Diagnoses: Present on Admission: . HCAP (healthcare-associated pneumonia) . Acute cholecystitis . Anemia of chronic renal failure, stage 5 (HCC) . Protein-calorie malnutrition, severe . Thrombocytopenia ([place])   I have reviewed the medical record, interviewed the patient and family, and examined the patient. The following aspects are pertinent.  Past Medical History:  Diagnosis Date  . AICD (automatic cardioverter/defibrillator) present    Biotronik Inventra 7 VR-T DX 04/12/15  . Anemia   . Anxiety   . C. difficile colitis   . Cardiomyopathy ([place])    02/02/16 ([facility]): Mixed ischemic and non-ischemic cardiomyopathy  . Charcot's joint of left foot   . CHF (congestive heart failure) (HCC)    systolic  . Cholelithiases 01/2017  . Complication of anesthesia 02/02/2017   has anxeity when  awaken with gallbladder surgery  . COPD (chronic obstructive pulmonary disease) ([place])    occasional home O2  . Coronary artery disease    2 stents  . Diabetes mellitus ([place])    Type II  . DVT (deep venous thrombosis) (HCC)    on coumadin  . ESRD (end stage renal disease) ([place])    tues/thurs/sat dialysis  . Hypertension   . Neuropathy   . Obstructive sleep apnea    no cpap  . Wears glasses    Social History   Social History  . Marital status: Single    Spouse name: N/A  . Number of children: N/A  . Years of education: N/A   Occupational History  . disabled    Social History Main Topics  . Smoking status: Former Smoker    Packs/day: 0.50    Years: 35.00    Types: Cigarettes    Quit date: 03/03/2006  . Smokeless tobacco: Never Used  . Alcohol use No  . Drug use: No  . Sexual activity: No   Other Topics Concern  . None   Social History Narrative   Admit to [facility] 04/10/16   Never married   Former smoker - 2007   Alcohol none   Full Code   History reviewed.  No pertinent family history. Scheduled Meds: . amiodarone  200 mg Oral Daily  . atorvastatin  10 mg Oral QHS  . budesonide  2 mL Inhalation BID  . enoxaparin (LOVENOX) injection  30 mg Subcutaneous Q24H  . famotidine  20 mg Oral QHS  . feeding supplement (NEPRO CARB STEADY)  237 mL Oral Daily  . insulin aspart  0-9 Units Subcutaneous TID WC  . lactose free nutrition  237 mL Oral Q2200  . midodrine  15 mg Oral TID WC  . mirtazapine  7.5 mg Oral QHS  . multivitamin  1 tablet Oral QHS  . sertraline  25 mg Oral QHS   Continuous Infusions: . ceFEPime (MAXIPIME) IV     PRN Meds:.albuterol, hydrOXYzine, ipratropium-albuterol, oxyCODONE-acetaminophen, promethazine Allergies  Allergen Reactions  . Entresto [Sacubitril-Valsartan] Other (See Comments)    Reaction not noted on MAR   . Latex Itching  . Sulfa Antibiotics Itching   Review of Systems  Constitutional: Positive for activity change, appetite  change, fatigue and unexpected weight change.  HENT: Negative for congestion, facial swelling, sore throat and trouble swallowing.   Eyes: Negative for visual disturbance.  Respiratory: Positive for shortness of breath. Negative for chest tightness.   Cardiovascular: Positive for chest pain (post CPR).  Gastrointestinal: Negative for abdominal distention, abdominal pain, constipation, diarrhea, nausea and vomiting.  Genitourinary:       ESRD with minimal urine output  Musculoskeletal: Positive for back pain and gait problem.  Skin: Positive for color change.  Neurological: Positive for weakness and numbness. Negative for dizziness, speech difficulty and light-headedness.  Psychiatric/Behavioral: Negative for confusion and sleep disturbance. The patient is nervous/anxious.    Physical Exam  Constitutional: She is oriented to person, place, and time. She appears cachectic. She has a sickly appearance. Nasal cannula in place.  Frail woman lying in bed  HENT:  Head: Normocephalic and atraumatic.  Mouth/Throat: Oropharynx is clear and moist. Abnormal dentition. No oropharyngeal exudate.  Eyes: EOM are normal.  Neck: Normal range of motion. Neck supple.  Cardiovascular: Normal rate.  An irregularly irregular rhythm present.  Pulmonary/Chest: Effort normal. No respiratory distress. She has decreased breath sounds in the right lower field and the left lower field. She has wheezes in the right upper field and the left upper field. She exhibits tenderness.  Abdominal: Soft.  Drain present and draining, site benign  Musculoskeletal:  Generalized weakness.  Neurological: She is alert and oriented to person, place, and time.  Skin: Skin is dry. There is pallor.  Cool extremities  Psychiatric: She has a normal mood and affect. Her behavior is normal. Judgment and thought content normal.   Vital Signs: BP 101/69   Pulse 77   Temp 98.4 F (36.9 C) (Oral)   Resp 20   Ht _0  (1.626 m)   Wt 59.3  kg (130 lb 11.2 oz)   SpO2 97%   BMI 22.43 kg/m  Pain Assessment: No/denies pain   Pain Score: 0-No pain   SpO2: SpO2: 97 % O2 Device:SpO2: 97 % O2 Flow Rate: .   IO: Intake/output summary:  Intake/Output Summary (Last 24 hours) at 03/28/17 1511 Last data filed at 03/28/17 1005  Gross per 24 hour  Intake          1545.83 ml  Output              150 ml  Net          1395.83 ml    LBM: Last BM  Date: 03/27/17 Baseline Weight: Weight: 59.3 kg (130 lb 11.2 oz) Most recent weight: Weight: 59.3 kg (130 lb 11.2 oz)     Palliative Assessment/Data: PPS 40%   Time in/out: 0945/1055; 1250/1315 Time Total: 105 minutes  Greater than 50%  of this time was spent counseling and coordinating care related to the above assessment and plan.  Signed by: Charlynn Court, NP Palliative Medicine Team Pager # [phone] (M-F 7a-5p) Team Phone # [phone] (Nights/Weekends)

## 2017-03-28 NOTE — Progress Notes (Addendum)
PROGRESS NOTE  Brandy Dudley  ZOX:[id number] DOB: March 08, 1951 DOA: 03/27/2017 PCP: [name], MD  Brief Narrative:   Brandy Dudley is a 66 y.o. female with medical history significant of CHF with EF 30%, AICD / pacer present, COPD, DM2 causing ESRD with dialysis TTS with recent admissions (4/7-9 for syncope - hypovolemic during dialysis; 3/23-3/26 for hypovolemic vs. Septic shock; 3/8-13 for acute cholecystitis with resultant placement of IR drain still in place) who presented after hypotension during dialysis today which caused the session to be shortened. She also had nausea, vomiting, and increased cough without fever or chills.  In the emergency department, her lactate was greater than 2 which was likely due to hypotension. Her white blood cell count was 15.9 and chest x-ray was concerning for healthcare associated pneumonia. She was given vancomycin and cefepime. Blood cultures are pending. She has clinically improved on her IV antibiotics. She continues to have low normal to hypotensive blood pressures despite midodrine.  Assessment & Plan:   Principal Problem:   HCAP (healthcare-associated pneumonia) Active Problems:   ESRD on dialysis (HCC)   Anemia of chronic renal failure, stage 5 (HCC)   Protein-calorie malnutrition, severe   Acute cholecystitis   Thrombocytopenia (HCC)  Sepsis due to HCAP, with lactic acidosis, hypotension, leukocytosis.  WBC trending down. - Check MRSA PCR:  Negative - Discontinue vancomycin - Continue cefepime -Sputum cultures -Blood cultures pending -  Tele:  Paced rhythm, previously interrogated and working normally.  Okay to d/c telemetry  Chronic hypotension -  Increase midodrine to 15mg  TID  Cholecystitis -Has perc drain in place since 3/9 -Does not appear to be the source of her infection -had recent appointment with IR.  Planning to continue for two more weeks and then perform clamping trial.  Severe protein calorie malnutrition, 35-lb weight  loss over last 5 months -  Liberalize diet -  Supplements -  Nutrition consultation  ESRD with renal insuffiency anemia -Hgb 11.3, improved -Nephrology consult  Diabetes mellitus type 2 with A1c 5, diet controlled.  At risk for hypoglycemia  Thrombocytopenia, platelets stable near 100,000  DVT prophylaxis: Lovenox  Code Status: Full Family Communication: None present. Disposition Plan:  Poor prognosis given weight loss, recurrent admissions, ongoing hypotension with HD.  Palliative care consult placed.    Consultants:   Nephrology  Palliative care  Procedures:  None  Antimicrobials:  Anti-infectives    Start     Dose/Rate Route Frequency Ordered Stop   03/29/17 1200  vancomycin (VANCOCIN) 500 mg in sodium chloride 0.9 % 100 mL IVPB  Status:  Discontinued     500 mg 100 mL/hr over 60 Minutes Intravenous Every T-Th-Sa (Hemodialysis) 03/27/17 1754 03/28/17 1028   03/27/17 1800  ceFEPIme (MAXIPIME) 2 g in dextrose 5 % 50 mL IVPB     2 g 100 mL/hr over 30 Minutes Intravenous  Once 03/27/17 1746 03/27/17 1916   03/27/17 1800  vancomycin (VANCOCIN) IVPB 1000 mg/200 mL premix     1,000 mg 200 mL/hr over 60 Minutes Intravenous  Once 03/27/17 1746 03/27/17 1916   03/27/17 1800  ceFEPIme (MAXIPIME) 2 g in dextrose 5 % 50 mL IVPB     2 g 100 mL/hr over 30 Minutes Intravenous Every T-Th-Sa (1800) 03/27/17 1754         Subjective: Has problems with feeling faint after hemodialysis her during hemodialysis. She feels flushed, sick to her stomach, anxious and like she is going to pass out. Over the last several days, she  has had progressive cough.  States that she overall feels a little better today. She still has a cough.  Objective: Vitals:   03/27/17 2023 03/27/17 2047 03/28/17 0517 03/28/17 1000  BP: 104/62  (!) 108/56 (!) 112/59  Pulse: 69  81 77  Resp: 19  20 20   Temp: 97.7 F (36.5 C)  98.6 F (37 C) 98.4 F (36.9 C)  TempSrc: Oral  Oral Oral  SpO2: 100% 98% 100%  100%  Weight: 59.3 kg (130 lb 11.2 oz)     Height: 5\' 4"  (1.626 m)       Intake/Output Summary (Last 24 hours) at 03/28/17 1205 Last data filed at 03/28/17 1005  Gross per 24 hour  Intake          1545.83 ml  Output              150 ml  Net          1395.83 ml   Filed Weights   03/27/17 2023  Weight: 59.3 kg (130 lb 11.2 oz)    Examination:  General exam:  Cachectic adult female, severe bitemporal wasting, no acute distress HEENT:  NCAT, MMM Respiratory system: Coarse rales heard over the anterior right chest, no wheezes, rhonchi Cardiovascular system: Regular rate and rhythm, normal S1/S2. 2/6 systolic murmur.  Warm extremities.   Gastrointestinal system: Normal active bowel sounds, soft, nondistended, nontender. MSK:  Normal tone and bulk, no lower extremity edema Neuro:  Grossly intact    Data Reviewed: I have personally reviewed following labs and imaging studies  CBC:  Recent Labs Lab 03/27/17 1639 03/28/17 0656  WBC 15.9* 8.3  NEUTROABS 14.0* 7.5  HGB 11.3* 10.6*  HCT 37.5 34.8*  MCV 83.3 82.7  PLT 109* 101*   Basic Metabolic Panel:  Recent Labs Lab 03/27/17 1639 03/28/17 0656  NA 135 136  K 4.0 4.8  CL 96* 99*  CO2 20* 19*  GLUCOSE 79 224*  BUN 39* 45*  CREATININE 6.88* 7.35*  CALCIUM 8.1* 8.3*   GFR: Estimated Creatinine Clearance: 6.5 mL/min (A) (by C-G formula based on SCr of 7.35 mg/dL (H)). Liver Function Tests:  Recent Labs Lab 03/27/17 1639  AST 26  ALT 12*  ALKPHOS 145*  BILITOT 1.4*  PROT 8.6*  ALBUMIN 2.6*   No results for input(s): LIPASE, AMYLASE in the last 168 hours. No results for input(s): AMMONIA in the last 168 hours. Coagulation Profile:  Recent Labs Lab 03/27/17 2042  INR 1.44   Cardiac Enzymes: No results for input(s): CKTOTAL, CKMB, CKMBINDEX, TROPONINI in the last 168 hours. BNP (last 3 results) No results for input(s): PROBNP in the last 8760 hours. HbA1C: No results for input(s): HGBA1C in the last  72 hours. CBG:  Recent Labs Lab 03/27/17 2018 03/28/17 0756  GLUCAP 161* 221*   Lipid Profile: No results for input(s): CHOL, HDL, LDLCALC, TRIG, CHOLHDL, LDLDIRECT in the last 72 hours. Thyroid Function Tests: No results for input(s): TSH, T4TOTAL, FREET4, T3FREE, THYROIDAB in the last 72 hours. Anemia Panel: No results for input(s): VITAMINB12, FOLATE, FERRITIN, TIBC, IRON, RETICCTPCT in the last 72 hours. Urine analysis:    Component Value Date/Time   COLORURINE AMBER (A) 07/15/2016 1636   APPEARANCEUR TURBID (A) 07/15/2016 1636   LABSPEC 1.025 07/15/2016 1636   PHURINE 5.0 07/15/2016 1636   GLUCOSEU NEGATIVE 07/15/2016 1636   HGBUR MODERATE (A) 07/15/2016 1636   BILIRUBINUR MODERATE (A) 07/15/2016 1636   KETONESUR 15 (A) 07/15/2016 1636   PROTEINUR  100 (A) 07/15/2016 1636   NITRITE NEGATIVE 07/15/2016 1636   LEUKOCYTESUR MODERATE (A) 07/15/2016 1636   Sepsis Labs: @LABRCNTIP (procalcitonin:4,lacticidven:4)  ) Recent Results (from the past 240 hour(s))  Blood Culture (routine x 2)     Status: None (Preliminary result)   Collection Time: 03/27/17  6:01 PM  Result Value Ref Range Status   Specimen Description BLOOD LEFT ANTECUBITAL  Final   Special Requests IN PEDIATRIC BOTTLE Blood Culture adequate volume  Final   Culture NO GROWTH < 24 HOURS  Final   Report Status PENDING  Incomplete  Blood Culture (routine x 2)     Status: None (Preliminary result)   Collection Time: 03/27/17  6:11 PM  Result Value Ref Range Status   Specimen Description BLOOD LEFT HAND  Final   Special Requests   Final    BOTTLES DRAWN AEROBIC ONLY Blood Culture adequate volume   Culture NO GROWTH < 24 HOURS  Final   Report Status PENDING  Incomplete  MRSA PCR Screening     Status: None   Collection Time: 03/28/17  7:42 AM  Result Value Ref Range Status   MRSA by PCR NEGATIVE NEGATIVE Final    Comment:        The GeneXpert MRSA Assay (FDA approved for NASAL specimens only), is one  component of a comprehensive MRSA colonization surveillance program. It is not intended to diagnose MRSA infection nor to guide or monitor treatment for MRSA infections.       Radiology Studies: Dg Chest 2 View  Result Date: 03/27/2017 CLINICAL DATA:  Productive cough and midline and left-sided chest pain for the past week. Former smoker. History of CHF, COPD, end-stage renal disease on dialysis, and coronary artery disease. EXAM: CHEST  2 VIEW COMPARISON:  PA and lateral chest x-ray of March 03, 2017 FINDINGS: The cardiac silhouette remains enlarged. The central pulmonary vascularity is mildly prominent. The interstitial markings are increased especially on the right. There is no alveolar infiltrate. There is no pleural effusion. There is calcification in the wall of the aortic arch. The dialysis catheter tip projects at the cavoatrial junction. The ICD electrode is in reasonable position. IMPRESSION: Improved aeration of both lungs since the previous study. Cardiomegaly with mild pulmonary vascular congestion and mild interstitial edema greatest on the right. No alveolar pneumonia. Thoracic aortic atherosclerosis. Electronically Signed   By: David  [place] M.D.   On: 03/27/2017 15:49     Scheduled Meds: . amiodarone  200 mg Oral Daily  . atorvastatin  10 mg Oral QHS  . budesonide  2 mL Inhalation BID  . enoxaparin (LOVENOX) injection  30 mg Subcutaneous Q24H  . famotidine  20 mg Oral QHS  . feeding supplement (NEPRO CARB STEADY)  237 mL Oral Daily  . insulin aspart  0-9 Units Subcutaneous TID WC  . ipratropium-albuterol  3 mL Nebulization TID  . lactose free nutrition  237 mL Oral Q2200  . midodrine  15 mg Oral TID WC  . mirtazapine  7.5 mg Oral QHS  . multivitamin  1 tablet Oral QHS  . sertraline  25 mg Oral QHS   Continuous Infusions: . ceFEPime (MAXIPIME) IV       LOS: 1 day    Time spent: 30 min    Renae Fickle, MD [facility] Pager [phone]  If  7PM-7AM, please contact night-coverage [email or web address] Password [email or web address] 03/28/2017, 12:05 PM

## 2017-03-28 NOTE — Progress Notes (Addendum)
Initial Nutrition Assessment  DOCUMENTATION CODES:   Severe malnutrition in context of chronic illness  INTERVENTION:   -Agree with liberalizing diet to Regular, recommend downgrading consistency to Dysphagia III due to pt without top teeth, difficulty chewing tough meats, raw produce, etc. Pt agreeable with this plan -Continue BoostPlus, increase to BID -Discontinue Nepro at present -Continue Prostat protein supplements BID -Continue Rena-Vit   NUTRITION DIAGNOSIS:   Malnutrition (Severe) related to chronic illness (ESRD) as evidenced by percent weight loss, severe depletion of muscle mass, severe depletion of body fat.  GOAL:   Patient will meet greater than or equal to 90% of their needs  MONITOR:   PO intake, Supplement acceptance, Labs, Weight trends  REASON FOR ASSESSMENT:   Malnutrition Screening Tool    ASSESSMENT:   66 yo female admitted with HCAP, possible sepsis. Pt with recent admission for cholecystitis s/p drain placement (still in place). Pt with hx of ESRD on HD, CAD, CHF, COPD, HTN, DM  Diet liberalized to Regular from Renal/Carb Modified by MD Recorded po intake 70% at breakfast this AM. Pt eating lunch on visit today, eating chicken off of chicken caesar salad but reports difficulty eating the lettuce. Pt has top dentures but unable to wear after losing significant wt, dentures don't fit properly. Reports she has been eating 2 meals per day, drinking Boost Plus daily  Pt with significant wt loss; reports she weighed 240 pounds before starting dialysis. Current wt 130 pounds. Pt weighed 189 pounds in August 2017 per chart review (31% wt loss in 9 months which is significant for time frame).  Pt weighed >200 pounds in May of 2017 (>35% wt loss in 1 year)  Pt reports she is very weak. Complains of LE weakness, reports she is unable to grip things with her hands very well post wt loss (noted pt with significant/severe muscle wasting in  hands)  Nutrition-Focused physical exam completed. Findings are mild/moderate to severe fat depletion, mild/moderate to severe muscle depletion, and no edema.   Labs: CBGs 656-812, phosphorus 6.3 (03/05/17) Meds: Rena-Vit, remeron  Diet Order: Regular  Skin:  Reviewed, no issues  Last BM:  03/27/17  Height:   Ht Readings from Last 1 Encounters:  03/27/17 5\' 4"  (1.626 m)    Weight:   Wt Readings from Last 1 Encounters:  03/27/17 130 lb 11.2 oz (59.3 kg)    Ideal Body Weight:     BMI:  Body mass index is 22.43 kg/m.  Estimated Nutritional Needs:   Kcal:  1650-1950 kcals  Protein:  80-95 g   Fluid:  1000 mL plus UOP  EDUCATION NEEDS:   Education needs addressed  Romelle Starcher MS, RD, LDN [phone] Pager  [phone] Weekend/On-Call Pager

## 2017-03-29 ENCOUNTER — Inpatient Hospital Stay (HOSPITAL_COMMUNITY): Payer: Medicare PPO

## 2017-03-29 DIAGNOSIS — N185 Chronic kidney disease, stage 5: Secondary | ICD-10-CM

## 2017-03-29 DIAGNOSIS — Z992 Dependence on renal dialysis: Secondary | ICD-10-CM

## 2017-03-29 DIAGNOSIS — Z515 Encounter for palliative care: Secondary | ICD-10-CM

## 2017-03-29 DIAGNOSIS — N186 End stage renal disease: Secondary | ICD-10-CM

## 2017-03-29 DIAGNOSIS — K810 Acute cholecystitis: Secondary | ICD-10-CM

## 2017-03-29 DIAGNOSIS — R092 Respiratory arrest: Secondary | ICD-10-CM

## 2017-03-29 DIAGNOSIS — I469 Cardiac arrest, cause unspecified: Secondary | ICD-10-CM

## 2017-03-29 DIAGNOSIS — Z7189 Other specified counseling: Secondary | ICD-10-CM

## 2017-03-29 DIAGNOSIS — J9690 Respiratory failure, unspecified, unspecified whether with hypoxia or hypercapnia: Secondary | ICD-10-CM | POA: Diagnosis present

## 2017-03-29 DIAGNOSIS — D631 Anemia in chronic kidney disease: Secondary | ICD-10-CM

## 2017-03-29 DIAGNOSIS — J189 Pneumonia, unspecified organism: Secondary | ICD-10-CM

## 2017-03-29 LAB — TROPONIN I
Troponin I: 0.08 ng/mL (ref <0.03)
Troponin I: 0.12 ng/mL (ref <0.03)
Troponin I: 0.18 ng/mL (ref <0.03)

## 2017-03-29 LAB — BLOOD GAS, ARTERIAL
ACID-BASE DEFICIT: 5.6 mmol/L — AB (ref 0.0–2.0)
Bicarbonate: 20.8 mmol/L (ref 20.0–28.0)
DRAWN BY: 244851
O2 CONTENT: 4 L/min
O2 SAT: 92.6 %
Patient temperature: 98.6
pCO2 arterial: 52.2 mmHg — ABNORMAL HIGH (ref 32.0–48.0)
pH, Arterial: 7.224 — ABNORMAL LOW (ref 7.350–7.450)
pO2, Arterial: 87 mmHg (ref 83.0–108.0)

## 2017-03-29 LAB — GLUCOSE, CAPILLARY
GLUCOSE-CAPILLARY: 141 mg/dL — AB (ref 65–99)
GLUCOSE-CAPILLARY: 113 mg/dL — AB (ref 65–99)
Glucose-Capillary: 143 mg/dL — ABNORMAL HIGH (ref 65–99)
Glucose-Capillary: 95 mg/dL (ref 65–99)

## 2017-03-29 LAB — RENAL FUNCTION PANEL
ALBUMIN: 2.3 g/dL — AB (ref 3.5–5.0)
ANION GAP: 15 (ref 5–15)
BUN: 62 mg/dL — AB (ref 6–20)
CHLORIDE: 99 mmol/L — AB (ref 101–111)
CO2: 23 mmol/L (ref 22–32)
Calcium: 8.4 mg/dL — ABNORMAL LOW (ref 8.9–10.3)
Creatinine, Ser: 8.15 mg/dL — ABNORMAL HIGH (ref 0.44–1.00)
GFR calc Af Amer: 5 mL/min — ABNORMAL LOW (ref >60)
GFR, EST NON AFRICAN AMERICAN: 5 mL/min — AB (ref >60)
GLUCOSE: 107 mg/dL — AB (ref 65–99)
PHOSPHORUS: 8.2 mg/dL — AB (ref 2.5–4.6)
POTASSIUM: 4.9 mmol/L (ref 3.5–5.1)
Sodium: 137 mmol/L (ref 135–145)

## 2017-03-29 LAB — CBC
HEMATOCRIT: 36.7 % (ref 36.0–46.0)
HEMATOCRIT: 34.7 % — AB (ref 36.0–46.0)
HEMOGLOBIN: 10.9 g/dL — AB (ref 12.0–15.0)
Hemoglobin: 10.5 g/dL — ABNORMAL LOW (ref 12.0–15.0)
MCH: 24.9 pg — ABNORMAL LOW (ref 26.0–34.0)
MCH: 24.9 pg — ABNORMAL LOW (ref 26.0–34.0)
MCHC: 29.7 g/dL — AB (ref 30.0–36.0)
MCHC: 30.3 g/dL (ref 30.0–36.0)
MCV: 83.8 fL (ref 78.0–100.0)
MCV: 82.2 fL (ref 78.0–100.0)
PLATELETS: 77 10*3/uL — AB (ref 150–400)
Platelets: 111 10*3/uL — ABNORMAL LOW (ref 150–400)
RBC: 4.38 MIL/uL (ref 3.87–5.11)
RBC: 4.22 MIL/uL (ref 3.87–5.11)
RDW: 21.2 % — AB (ref 11.5–15.5)
RDW: 20.9 % — AB (ref 11.5–15.5)
WBC: 10.6 10*3/uL — ABNORMAL HIGH (ref 4.0–10.5)
WBC: 13.8 10*3/uL — AB (ref 4.0–10.5)

## 2017-03-29 LAB — MAGNESIUM: Magnesium: 2.1 mg/dL (ref 1.7–2.4)

## 2017-03-29 LAB — HIV ANTIBODY (ROUTINE TESTING W REFLEX): HIV Screen 4th Generation wRfx: NONREACTIVE

## 2017-03-29 LAB — LACTIC ACID, PLASMA
LACTIC ACID, VENOUS: 1.4 mmol/L (ref 0.5–1.9)
Lactic Acid, Venous: 1.9 mmol/L (ref 0.5–1.9)

## 2017-03-29 LAB — PHOSPHORUS: PHOSPHORUS: 6.9 mg/dL — AB (ref 2.5–4.6)

## 2017-03-29 LAB — PROCALCITONIN: Procalcitonin: 1.31 ng/mL

## 2017-03-29 LAB — BRAIN NATRIURETIC PEPTIDE: B Natriuretic Peptide: >4500 pg/mL — ABNORMAL HIGH (ref 0.0–100.0)

## 2017-03-29 MED ORDER — ASPIRIN 300 MG RE SUPP
300.0000 mg | RECTAL | Status: AC
  Filled 2017-03-29: qty 1

## 2017-03-29 MED ORDER — LIDOCAINE HCL (PF) 1 % IJ SOLN: 5.0000 mL | INTRAMUSCULAR | Status: DC | PRN

## 2017-03-29 MED ORDER — HEPARIN SODIUM (PORCINE) 1000 UNIT/ML DIALYSIS: 1000.0000 [IU] | INTRAMUSCULAR | Status: DC | PRN

## 2017-03-29 MED ORDER — SODIUM CHLORIDE 0.9 % IV SOLN: 100.0000 mL | INTRAVENOUS | Status: DC | PRN

## 2017-03-29 MED ORDER — FENTANYL CITRATE (PF) 100 MCG/2ML IJ SOLN
INTRAMUSCULAR | Status: AC
  Filled 2017-03-29: qty 2

## 2017-03-29 MED ORDER — HEPARIN SODIUM (PORCINE) 1000 UNIT/ML DIALYSIS: 6000.0000 [IU] | Freq: Once | INTRAMUSCULAR | Status: DC

## 2017-03-29 MED ORDER — ALTEPLASE 2 MG IJ SOLR
2.0000 mg | Freq: Once | INTRAMUSCULAR | Status: DC | PRN
Start: 2017-03-29 — End: 2017-03-29

## 2017-03-29 MED ORDER — IPRATROPIUM-ALBUTEROL 0.5-2.5 (3) MG/3ML IN SOLN
3.0000 mL | RESPIRATORY_TRACT | Status: DC
  Administered 2017-03-29 (×2): 3 mL via RESPIRATORY_TRACT
  Filled 2017-03-29 (×4): qty 3

## 2017-03-29 MED ORDER — SODIUM CHLORIDE 0.9 % IV SOLN: 250.0000 mL | INTRAVENOUS | Status: DC | PRN

## 2017-03-29 MED ORDER — LIDOCAINE-PRILOCAINE 2.5-2.5 % EX CREA: 1.0000 "application " | TOPICAL_CREAM | CUTANEOUS | Status: DC | PRN

## 2017-03-29 MED ORDER — IPRATROPIUM-ALBUTEROL 0.5-2.5 (3) MG/3ML IN SOLN
3.0000 mL | Freq: Three times a day (TID) | RESPIRATORY_TRACT | Status: DC
  Administered 2017-03-30 – 2017-04-01 (×5): 3 mL via RESPIRATORY_TRACT
  Filled 2017-03-29 (×7): qty 3

## 2017-03-29 MED ORDER — ONDANSETRON HCL 4 MG/2ML IJ SOLN
4.0000 mg | Freq: Four times a day (QID) | INTRAMUSCULAR | Status: DC | PRN
  Administered 2017-03-29: 4 mg via INTRAVENOUS
  Filled 2017-03-29: qty 2

## 2017-03-29 MED ORDER — PENTAFLUOROPROP-TETRAFLUOROETH EX AERO: 1.0000 "application " | INHALATION_SPRAY | CUTANEOUS | Status: DC | PRN

## 2017-03-29 MED ORDER — ALTEPLASE 2 MG IJ SOLR
2.0000 mg | Freq: Once | INTRAMUSCULAR | Status: DC | PRN
  Filled 2017-03-29: qty 2

## 2017-03-29 MED ORDER — HEPARIN SODIUM (PORCINE) 1000 UNIT/ML DIALYSIS: 20.0000 [IU]/kg | INTRAMUSCULAR | Status: DC | PRN

## 2017-03-29 MED ORDER — ASPIRIN 81 MG PO CHEW
324.0000 mg | CHEWABLE_TABLET | ORAL | Status: AC
  Administered 2017-03-29: 324 mg via ORAL
  Filled 2017-03-29: qty 4

## 2017-03-29 MED ORDER — MIDAZOLAM HCL 2 MG/2ML IJ SOLN
INTRAMUSCULAR | Status: AC
  Filled 2017-03-29: qty 2

## 2017-03-29 MED ORDER — BUDESONIDE 0.5 MG/2ML IN SUSP
0.5000 mg | Freq: Two times a day (BID) | RESPIRATORY_TRACT | Status: DC
  Administered 2017-03-31 – 2017-04-01 (×3): 0.5 mg via RESPIRATORY_TRACT
  Filled 2017-03-29 (×8): qty 2

## 2017-03-29 MED FILL — Medication: Qty: 1 | Status: AC

## 2017-03-29 NOTE — Progress Notes (Signed)
27 minutes into HD treatment, patient became unresponsive. PEA arrest, code blue called, CPR initiated. 1 amp EPI given. Primary MD and Renal MD, at bedside. Patient became responsive  With BP 104 66, recovered and transferred to 2 M14, ON 4 L 02, A/O X4, report called to RN

## 2017-03-29 NOTE — Progress Notes (Signed)
   03/29/17 0900  Clinical Encounter Type  Visited With Patient;Health care provider  Visit Type Follow-up  Referral From Chaplain  Consult/Referral To Chaplain  Spiritual Encounters  Spiritual Needs Sacred text;Prayer;Emotional  Stress Factors  Patient Stress Factors Health changes    Patient post code and alert. Patient and physician asked for AD. Chaplain retrieved AD paperwork from 6E and determined that not enough time to complete. Paperwork started but not completed, in Spiritual Care office with [employer]. Dr. Sheppard Evens patient wished to be made DNR. Provided emotional support, ministry of presence and prayer. Kacey Dysert L. Salomon Fick, [place] [phone]

## 2017-03-29 NOTE — Consult Note (Signed)
PULMONARY / CRITICAL CARE MEDICINE   Name: Nai Magazine MRN: [id number] DOB: 1951-08-31    ADMISSION DATE:  03/27/2017 CONSULTATION DATE:  5/3  REFERRING MD:   Short   CHIEF COMPLAINT:   Cardiac arrest  HISTORY OF PRESENT ILLNESS:   This is a 66 year old female w/ h/o CHF (EF 30%) prior AICD w/ PPM, COPD and ESRD. Most recent history admitted w/ acute cholecystitis from 3/8 to 3/13. For this she was treated w/ abx and perc drain. Presented to ER on 5/1 w/ hypotension during HD w/ associated shortness of breath, N&V. She was admitted w/ working dx of sepsis and HCAP. Treated w/ abx, had persistent hypotension for which her midodrine was titrated up. On the am of 5/3 was feeling better. Was on room air. Went to HD and about 20 minutes in she developed acute PEA arrest. She underwent 2-3 minutes of ACLS w/ successful ROSC. PCCM was asked to see post-arrest. On PCCM arrival was awake and oriented and asking BIPAP to be removed.   PAST MEDICAL HISTORY :  She  has a past medical history of AICD (automatic cardioverter/defibrillator) present; Anemia; Anxiety; C. difficile colitis; Cardiomyopathy (HCC); Charcot's joint of left foot; CHF (congestive heart failure) (HCC); Cholelithiases (01/2017); Complication of anesthesia (02/02/2017); COPD (chronic obstructive pulmonary disease) (HCC); Coronary artery disease; Diabetes mellitus (HCC); DVT (deep venous thrombosis) (HCC); ESRD (end stage renal disease) (HCC); Hypertension; Neuropathy; Obstructive sleep apnea; and Wears glasses.  PAST SURGICAL HISTORY: She  has a past surgical history that includes Insertion of dialysis catheter (Left, 04/03/2016); AV fistula placement (Left, 04/03/2016); Ligation of competing branches of arteriovenous fistula (Left, 06/16/2016); Fistula superficialization (Left, 06/16/2016); Revision of arteriovenous goretex graft (Left, 06/16/2016); Cardiac catheterization (N/A, 07/20/2016); Cardiac catheterization (N/A, 07/20/2016); Coronary  angioplasty; AV fistula placement (Right, 08/21/2016); ir generic historical (02/02/2017); OTHER SURGICAL HISTORY (02/02/2017); and AV fistula placement (Left, 02/12/2017).  Allergies  Allergen Reactions  . Entresto [Sacubitril-Valsartan] Other (See Comments)    Reaction not noted on MAR   . Latex Itching  . Sulfa Antibiotics Itching    No current facility-administered medications on file prior to encounter.    Current Outpatient Prescriptions on File Prior to Encounter  Medication Sig  . acetaminophen (TYLENOL) 325 MG tablet Take 325 mg by mouth every 6 (six) hours as needed for mild pain or fever (For fever >99.5).   Marland Kitchen albuterol (PROAIR HFA) 108 (90 Base) MCG/ACT inhaler Inhale 2 puffs into the lungs every 6 (six) hours as needed for wheezing or shortness of breath.   Marland Kitchen amiodarone (PACERONE) 200 MG tablet Take 1 tablet (200 mg total) by mouth daily.  Marland Kitchen atorvastatin (LIPITOR) 10 MG tablet Take 10 mg by mouth at bedtime.   . famotidine (PEPCID) 20 MG tablet Take 20 mg by mouth at bedtime.  . fluticasone (FLOVENT HFA) 110 MCG/ACT inhaler Inhale 2 puffs into the lungs 2 (two) times daily.   . hydrOXYzine (ATARAX/VISTARIL) 50 MG tablet Take 50 mg by mouth every 6 (six) hours as needed for itching.  . insulin lispro (HUMALOG) 100 UNIT/ML injection Inject into the skin 3 (three) times daily as needed for high blood sugar. BGL 0-59 = Initiate hypoglycemic protocol; 60-149 = 0 units; 150-250 = 5 units; 251-300 = 8 units; 301-350 = 8 units; >350 = Call MD  . Inulin (FIBER CHOICE PO) Take 2 tablets by mouth daily as needed (Constipation).   . lactose free nutrition (BOOST PLUS) LIQD Take 237 mLs by mouth daily.   Marland Kitchen  midodrine (PROAMATINE) 10 MG tablet Take 1 tablet (10 mg total) by mouth 3 (three) times daily with meals. (Patient taking differently: Take 10 mg by mouth See admin instructions. AT 0730, 1130, AND 1630 ON NON-DIALYSIS DAYS & AT 0600, 1100, AND 2200 ON DIALYSIS DAYS)  . mirtazapine (REMERON)  15 MG tablet Take 7.5 mg by mouth at bedtime.   . Multiple Vitamins-Minerals (CERTAVITE/ANTIOXIDANTS) TABS Take 1 tablet by mouth at bedtime.  . nitroGLYCERIN (NITROSTAT) 0.4 MG SL tablet Place 0.4 mg under the tongue every 5 (five) minutes as needed for chest pain.  . Nutritional Supplements (ENSURE CLEAR) LIQD Take 237 mLs by mouth daily. For 14 days for cholycystitis poor intake  . oxyCODONE-acetaminophen (PERCOCET/ROXICET) 5-325 MG tablet Take 1 tablet by mouth every 6 (six) hours as needed (for mild pain).   . OXYGEN Inhale 2 L into the lungs as needed (for shortness of breath).   . Propylene Glycol (SYSTANE BALANCE) 0.6 % SOLN Place 1 drop into both eyes 2 (two) times daily.  . sertraline (ZOLOFT) 25 MG tablet Take 25 mg by mouth at bedtime.   Marland Kitchen tiotropium (SPIRIVA) 18 MCG inhalation capsule Place 1 capsule (18 mcg total) into inhaler and inhale daily.  . vancomycin (VANCOCIN) 125 MG capsule Take 125 mg by mouth 4 (four) times daily.    FAMILY HISTORY:  Her indicated that all of her three sisters are alive.    SOCIAL HISTORY: She  reports that she quit smoking about 11 years ago. Her smoking use included Cigarettes. She has a 17.50 pack-year smoking history. She has never used smokeless tobacco. She reports that she does not drink alcohol or use drugs.  REVIEW OF SYSTEMS:   Review of Systems:   Bolds are positive  Constitutional: weight loss, gain, night sweats, Fevers, chills, fatigue .  HEENT: headaches, Sore throat, sneezing, nasal congestion, post nasal drip, Difficulty swallowing, Tooth/dental problems, visual complaints visual changes, ear ache CV:  chest pain, radiates:,Orthopnea, PND, swelling in lower extremities dizziness, palpitations, syncope.  GI  heartburn, indigestion, abdominal pain, nausea, vomiting, diarrhea, change in bowel habits, loss of appetite, bloody stools.  Resp: cough, productive: , hemoptysis, dyspnea, chest pain, pleuritic.  Skin: rash or itching or  icterus GU: dysuria, change in color of urine, urgency or frequency. flank pain, hematuria  MS: joint pain or swelling. decreased range of motion  Psych: change in mood or affect. depression or anxiety.  Neuro: difficulty with speech, weakness, numbness, ataxia    SUBJECTIVE:  Awake, wants BIPAP off   VITAL SIGNS: BP (P) 100/69 (BP Location: Left Arm)   Pulse (P) 69   Temp 97.6 F (36.4 C) (Oral)   Resp (P) 19   Ht 5\' 4"  (1.626 m)   Wt 133 lb 2.5 oz (60.4 kg)   SpO2 99%   BMI 22.86 kg/m   HEMODYNAMICS:    VENTILATOR SETTINGS: Vent Mode: BIPAP FiO2 (%):  [100 %] 100 % Set Rate:  [10 bmp] 10 bmp PEEP:  [8 cmH20] 8 cmH20  INTAKE / OUTPUT: I/O last 3 completed shifts: In: 1905.8 [P.O.:720; I.V.:435.8; IV Piggyback:750] Out: 204 [Drains:200; Stool:4]  PHYSICAL EXAMINATION: General appearance:  Frail 66 Year old female, cachectic conversant  Eyes: anicteric sclerae  , moist conjunctivae; PERRL, EOMI bilaterally. Mouth:  membranes and no mucosal ulcerations; normal hard and soft palate Neck: Trachea midline; neck supple, no JVD Lungs/chest: diffuse rhonchi with some increase in  respiratory effort CV: RRR, no MRGs  Abdomen: Soft, non-tender; no masses  or HSM Extremities: No peripheral edema or extremity lymphadenopathy Skin: Normal temperature, turgor and texture; no rash, ulcers or subcutaneous nodules Neuro/ Psych: Appropriate affect, alert and oriented to person, place and time   LABS:  BMET  Recent Labs Lab 03/27/17 1639 03/28/17 0656 03/29/17 0750  NA 135 136 137  K 4.0 4.8 4.9  CL 96* 99* 99*  CO2 20* 19* 23  BUN 39* 45* 62*  CREATININE 6.88* 7.35* 8.15*  GLUCOSE 79 224* 107*    Electrolytes  Recent Labs Lab 03/27/17 1639 03/28/17 0656 03/29/17 0750  CALCIUM 8.1* 8.3* 8.4*  PHOS  --   --  8.2*    CBC  Recent Labs Lab 03/27/17 1639 03/28/17 0656 03/29/17 0750  WBC 15.9* 8.3 PENDING  HGB 11.3* 10.6* 10.9*  HCT 37.5 34.8* 36.7  PLT  109* 101* PENDING    Coag's  Recent Labs Lab 03/27/17 2042  APTT 33  INR 1.44    Sepsis Markers  Recent Labs Lab 03/27/17 1650 03/27/17 2042 03/27/17 2345  LATICACIDVEN 2.38* 1.6 1.2  PROCALCITON  --  1.42  --     ABG  Recent Labs Lab 03/29/17 0821  PHART 7.224*  PCO2ART 52.2*  PO2ART 87.0    Liver Enzymes  Recent Labs Lab 03/27/17 1639 03/29/17 0750  AST 26  --   ALT 12*  --   ALKPHOS 145*  --   BILITOT 1.4*  --   ALBUMIN 2.6* 2.3*    Cardiac Enzymes No results for input(s): TROPONINI, PROBNP in the last 168 hours.  Glucose  Recent Labs Lab 03/27/17 2018 03/28/17 0756 03/28/17 1206 03/28/17 1711 03/28/17 2039 03/29/17 0832  GLUCAP 161* 221* 245* 195* 144* 143*    Imaging No results found.   STUDIES:  ECHO 5/1>>>  CULTURES: Blood culture 5/1>>>  ANTIBIOTICS: Cefepime 5/1>>>  SIGNIFICANT EVENTS: 5/1 admitted for hypotension and possibly sepsis.    LINES/TUBES:   ASSESSMENT / PLAN:  PULMONARY A: Pulmonary edema Possible HCAP Plan:   Wean O2 See ID section   CARDIOVASCULAR A:  Cardiopulmonary PEA arrest s/p syncopal episode on HD Acute systolic CM Chronic hypotension  Plan  Obtain new echo Cont midodrine   RENAL A: ESRD Plan Trend chemistry Not likely a candidate for further HD Would NOT offer CRRT  GI A: Cholecystitis w/ perc drain since 3/9 Severe Protein calorie malnutrition  Nausea  Plan PRN zofran  drain management   HEME Anemia of chronic disease.  Chronic thrombocytopenia Plan Trend cbc Cont LMWH  Infectious disease  A: Possible HCAP Plan Day 3/x cefepime   ENDOCRINE A: Diabetes  Plan ssi   NEURO A: Anxiety/pain Plan Supportive care  DISCUSSION: ESRD w/ FTT and severe CM. Now intolerant of HD. Has had 35 lb wt loss recently. Suspect her CM is worse and simply can no longer tolerate HD. Made her DNR. Will get echo and cont supportive care. Palliative care consulted. May  be very little to do here.   FAMILY  - Updates: [name] (sister) updated.   My ccm time 35 minutes  Simonne Martinet ACNP-BC [facility] Pulmonary/Critical Care Pager # [phone] OR # [phone] if no answer     03/29/2017, 9:05 AM   ATTENDING NOTE / ATTESTATION NOTE :   I have discussed the case with the resident/APP  Anders Simmonds NP  I agree with the resident/APP's  history, physical examination, assessment, and plans.    I have edited the above note and modified it according to our  agreed history, physical examination, assessment and plan.   Briefly, this is a 66 year old female w/ h/o CHF (EF 30%) prior AICD w/ PPM, COPD and ESRD. Most recent history admitted w/ acute cholecystitis from 3/8 to 3/13. For this she was treated w/ abx and perc drain. Presented to ER on 5/1 w/ hypotension during HD w/ associated shortness of breath, N&V. She was admitted w/ working dx of sepsis and HCAP. Treated w/ abx, had persistent hypotension for which her midodrine was titrated up. On the am of 5/3 was feeling better. Was on room air. Went to HD and about 20 minutes in she developed acute PEA arrest. She underwent 2-3 minutes of ACLS w/ successful ROSC. PCCM was asked to see post-arrest. On PCCM arrival was awake and oriented and asking BIPAP to be removed.   Goals of care were extensively discussed with the patient. She wanted to be a full DO NOT RESUSCITATE. Her siblings were in agreement.  I also discussed with renal service. Dr. Arlean Hopping did not think that CRRT will significantly improve her survival.     Vitals:  Vitals:   03/29/17 1230 03/29/17 1300 03/29/17 1330 03/29/17 1400  BP: 109/70 94/67 106/69 97/69  Pulse: 73 72 71 72  Resp: 18 17 19 17   Temp:      TempSrc:      SpO2: 99% 100% 100% 100%  Weight:      Height:        Constitutional/General: chronically ill, not in any distress. On [place]. comfortable  Body mass index is 22.86 kg/m. Wt Readings from Last 3 Encounters:  03/29/17 60.4 kg  (133 lb 2.5 oz)  03/07/17 60.8 kg (134 lb)  03/04/17 65.1 kg (143 lb 9.6 oz)    HEENT: PERLA, anicteric sclerae. (-) Oral thrush.   Neck: No masses. Midline trachea. No JVD, (-) LAD. (-) bruits appreciated.  Respiratory/Chest: Grossly normal chest. (-) deformity. (-) Accessory muscle use.  Symmetric expansion. Diminished BS on both lower lung zones. (-) wheezing Rhonchi at bases (-) egophony  Cardiovascular: Regular rate and  rhythm, heart sounds normal, no murmur or gallops,  Trace peripheral edema  Gastrointestinal:  Normal bowel sounds. Soft, non-tender. No hepatosplenomegaly.  (-) masses.   Musculoskeletal:  Normal muscle tone.   Extremities: Grossly normal. (-) clubbing, cyanosis.  Trace  edema  Skin: (-) rash,lesions seen.   Neurological/Psychiatric : CN grossly intact. (-) lateralizing signs.     CBC Recent Labs     03/28/17  0656  03/29/17  0750  03/29/17  0946  WBC  8.3  10.6*  13.8*  HGB  10.6*  10.9*  10.5*  HCT  34.8*  36.7  34.7*  PLT  101*  111*  77*    Coag's Recent Labs     03/27/17  2042  APTT  33  INR  1.44    BMET Recent Labs     03/27/17  1639  03/28/17  0656  03/29/17  0750  NA  135  136  137  K  4.0  4.8  4.9  CL  96*  99*  99*  CO2  20*  19*  23  BUN  39*  45*  62*  CREATININE  6.88*  7.35*  8.15*  GLUCOSE  79  224*  107*    Electrolytes Recent Labs     03/27/17  1639  03/28/17  0656  03/29/17  0750  03/29/17  0946  CALCIUM  8.1*  8.3*  8.4*   --   MG   --    --    --   2.1  PHOS   --    --   8.2*  6.9*    Sepsis Markers Recent Labs     03/27/17  2042  03/29/17  1241  PROCALCITON  1.42  1.31    ABG Recent Labs     03/29/17  0821  PHART  7.224*  PCO2ART  52.2*  PO2ART  87.0    Liver Enzymes Recent Labs     03/27/17  1639  03/29/17  0750  AST  26   --   ALT  12*   --   ALKPHOS  145*   --   BILITOT  1.4*   --   ALBUMIN  2.6*  2.3*    Cardiac Enzymes Recent Labs     03/29/17  0946   TROPONINI  0.08*    Glucose Recent Labs     03/28/17  0756  03/28/17  1206  03/28/17  1711  03/28/17  2039  03/29/17  0832  03/29/17  1201  GLUCAP  221*  245*  195*  144*  143*  141*    Imaging Dg Chest 2 View  Result Date: 03/27/2017 CLINICAL DATA:  Productive cough and midline and left-sided chest pain for the past week. Former smoker. History of CHF, COPD, end-stage renal disease on dialysis, and coronary artery disease. EXAM: CHEST  2 VIEW COMPARISON:  PA and lateral chest x-ray of March 03, 2017 FINDINGS: The cardiac silhouette remains enlarged. The central pulmonary vascularity is mildly prominent. The interstitial markings are increased especially on the right. There is no alveolar infiltrate. There is no pleural effusion. There is calcification in the wall of the aortic arch. The dialysis catheter tip projects at the cavoatrial junction. The ICD electrode is in reasonable position. IMPRESSION: Improved aeration of both lungs since the previous study. Cardiomegaly with mild pulmonary vascular congestion and mild interstitial edema greatest on the right. No alveolar pneumonia. Thoracic aortic atherosclerosis. Electronically Signed   By: David  [place] M.D.   On: 03/27/2017 15:49   Dg Chest Port 1 View  Result Date: 03/29/2017 CLINICAL DATA:  Respiratory arrest in dialysis. Hx of AICD, cardiomyopathy, CHF, COPD, CAD, diabetes, hypertension, AV fistula placement. Former smoker(2007). EXAM: PORTABLE CHEST 1 VIEW COMPARISON:  03/27/2017 FINDINGS: In the right upper lobe, anteriorly adjacent to the minor fissure, there is focal hazy airspace opacity which has developed since the prior exam. Remainder of the lungs is clear. Mild to moderate enlargement of the cardiopericardial silhouette is stable. No mediastinal or hilar masses. Stable left anterior chest wall pacemaker. Right internal jugular tunneled dual lumen central venous line is also stable. No pneumothorax. IMPRESSION: 1. Small area of  new airspace opacity in the right upper lobe. This is consistent with pneumonia in the proper clinical setting. Alternatively, may reflect atelectasis. 2. No other evidence of acute cardiopulmonary disease. Electronically Signed   By: Amie Portland M.D.   On: 03/29/2017 09:22   Assessment:  S/P Acute Hypoxemic Respiratory Failure with cardiac arrest briefly on 5/3 likely 2/2 Flash pulmonary edema on top of possible HCAP.  ESRD, on HD, not tolerating HD with episodes of hypotension and syncope CHF, systolic, acute on chronic COPD, not in exacerbation Cholecystitis, S/P drain  Plan : 1. I reiterated the plan with the patient and her siblings. Patient is to remain DO NOT RESUSCITATE. Patient will decide regarding whether she would  want to keep on doing hemodialysis or not in the next 1-2 days. Per renal service, CRRT is not recommended for the patient given her overall poor prognosis. 2. Keep o2 sats > 88% 3. Cont cefepime for now.  Deescalate accordingly.  4. Will defer HD to renal service.  5. Cont other meds.  6. Aspiration precaution.  7. Keep on neb meds for now.   Plan to transfer back to telemetry floor.  I discussed case with Dr. Renae Fickle.  Patient will be under her service starting tomorrow. PCCM will be off then.    Family :Family updated at length today.   Pollie Meyer, MD 03/29/2017, 3:29 PM [place] [facility] Pager [phone] After 3 pm or if no answer, call [phone]

## 2017-03-29 NOTE — Code Documentation (Signed)
  Patient Name: Brandy Dudley   MRN: [id number]   Date of Birth/ Sex: October 10, 1951 , female      Admission Date: 03/27/2017  Attending Provider: Renae Fickle, MD  Primary Diagnosis: HCAP (healthcare-associated pneumonia)   Indication: Pt was in her usual state of health until this morning at 8 am, when she was noted to be apnec. Code blue was subsequently called. She subsequently developed PEA arrest. At the time of arrival on scene, ACLS protocol was underway.   Technical Description:  - CPR performance duration:  5  - Was defibrillation or cardioversion used? No  - Was external pacer placed? No  - Was patient intubated pre/post CPR? No   Medications Administered: Y = Yes; Blank = No Amiodarone    Atropine    Calcium    Epinephrine  Y  Lidocaine    Magnesium    Norepinephrine    Phenylephrine    Sodium bicarbonate    Vasopressin     Post CPR evaluation:  - Final Status - Was patient successfully resuscitated ? Yes - What is current rhythm? Sinus tachycardia - What is current hemodynamic status? Stable  Miscellaneous Information:  - Labs sent, including: CXR, EKG, ABG  - Primary team notified?  yes  - Family Notified? no  - Additional notes/ transfer status: Contacting PCCM     Bodey Frizell, DO  03/29/2017, 8:20 AM

## 2017-03-29 NOTE — Progress Notes (Signed)
PT Cancellation Note  Patient Details Name: Brandy Dudley MRN: [id number] DOB: 09/21/1951   Cancelled Treatment:    Reason Eval/Treat Not Completed: Patient not medically ready. Pt transferred to 69M. Will hold PT until medically cleared to continue.   Colin Broach PT, DPT  [phone]  03/29/2017, 10:13 AM

## 2017-03-29 NOTE — Progress Notes (Signed)
   03/29/17 0800  Clinical Encounter Type  Visited With Patient;Health care provider  Visit Type Code  Referral From Care management  Consult/Referral To Chaplain  Spiritual Encounters  Spiritual Needs Emotional  Stress Factors  Patient Stress Factors None identified  Family Stress Factors None identified   Chaplain was paged to a code-blue. Chaplain called family told them no medical information but asked if they could come to [place] for emotional support. Unit Chaplain will follow up.

## 2017-03-29 NOTE — Progress Notes (Signed)
RT responded to code blue called at 0806. Pt was being bagged by RN upon my arrival. RT assisted RN with BVM. 2nd RT at code and set up for intubation, pt began to breathe spontaneously with ROSC. ABG ordered as patient in not at her baseline. Awaiting ABG results. Will cont to monitor

## 2017-03-29 NOTE — Progress Notes (Signed)
Responded to referral from chaplain colleague to complete AD.  I spoke with patient and patient nurse for clarity on patient's wishes. Patient wanted to wait on sister to arrive before she proceeded. Sister now at bedside. AD was completed and three copies were made .  One for patient's chart, two for HCPA agents and original for patient. Patient was alert and smiling and in good spirits. Provided emotional and spiritual support top patient and family. Chaplain available as needed.   03/29/17 1202  Clinical Encounter Type  Visited With Patient;Health care provider  Visit Type Follow-up;Spiritual support  Referral From Chaplain  Spiritual Encounters  Spiritual Needs Literature;Emotional  Stress Factors  Patient Stress Factors None identified  Advance Directives (For Healthcare)  Does Patient Have a Medical Advance Directive? No  Would patient like information on creating a medical advance directive? Yes (Inpatient - patient requests chaplain consult to create a medical advance directive)  Venida Jarvis, Chaplain,pager [phone]

## 2017-03-29 NOTE — Progress Notes (Signed)
New Admission Note:   Arrival Method: Bed Mental Orientation: A&O X4 Telemetry: Initiated Assessment: Completed Skin: See flowsheets IV: Clean, Dry, Intact Pain: 7/10 Tubes: Clean, Dry, Intact Safety Measures: Safety Fall Prevention Plan has been given, discussed and signed Admission: Completed Unit Orientation: Patient has been orientated to the room, unit and staff.   Orders have been reviewed and implemented. Will continue to monitor the patient. Call light has been placed within reach and bed alarm has been activated.    Britt Bolognese RN, BSN

## 2017-03-29 NOTE — Progress Notes (Signed)
OT Cancellation Note  Patient Details Name: Brandy Dudley MRN: [id number] DOB: 05-07-1951   Cancelled Treatment:    Reason Eval/Treat Not Completed: Medical issues which prohibited therapy. Pt with code blue this AM during dialysis and transferred to 56m. Will hold therapy today and check back for appropriateness at later time.  Evette Georges [phone] 03/29/2017, 9:42 AM

## 2017-03-29 NOTE — Significant Event (Signed)
Rapid Response Event Note  Overview: Time Called: 0804 Arrival Time: 0805 Event Type: Cardiac, Respiratory, Other (Comment) (Code Blue event)  Initial Focused Assessment: Patient with Respiratory/PEA arrest 1amp epi, CPR x 6 min  Awoke restless and confused.   Patient mental status improving  Still with increased work of breathing,  Skin cold and clammy  Code team and Dr [name] at bedside Muddy Sink NP at bedside  Interventions: 12 lead EKG ABG done PCXR ordered  0845  CCM consulted, Anders Simmonds at bedside, discussing code status  0855  BP 97/55  AF 85  RR 20  O2 sat 98% on 4L [place]  Plan of Care (if not transferred):  Event Summary: Name of Physician Notified: Code Team at bedside,  Dr [name] notifed at [phone]  Name of Consulting Physician Notified: CCM/ Anders Simmonds NP at    Outcome: Transferred (Comment)  Event End Time: 0930  Marcellina Millin

## 2017-03-29 NOTE — Progress Notes (Signed)
PROGRESS NOTE  Viridiana Spaid  GNF:[id number] DOB: October 19, 1951 DOA: 03/27/2017 PCP: [name], MD  Brief Narrative:   Brandy Dudley is a 66 y.o. female with medical history significant of CHF with EF 30%, AICD / pacer present, COPD, DM2 causing ESRD with dialysis TTS with recent admissions (4/7-9 for syncope - hypovolemic during dialysis; 3/23-3/26 for hypovolemic vs. Septic shock; 3/8-13 for acute cholecystitis with resultant placement of IR drain still in place) who presented after hypotension during dialysis today which caused the session to be shortened. She also had nausea, vomiting, and increased cough without fever or chills.  In the emergency department, her lactate was greater than 2 which was likely due to hypotension. Her white blood cell count was 15.9 and chest x-ray was concerning for healthcare associated pneumonia. She was given vancomycin and cefepime. Blood cultures are pending. She has clinically improved on her IV antibiotics. She continues to have low normal to hypotensive blood pressures despite midodrine.  MRSA PCR was negative so her vancomycin was discontinued.  Her midodrine was increased to support blood pressure during dialysis.  She was looking and feeling well prior to dialysis on 5/3 on room air.  She was on dialysis for 20 minutes when she developed respiratory arrest.  A code blue was called.  She was being supported with bag-mask ventilation when she developed PEA.  She had 2-3 minutes of chest compressions and 1 dose of epinephrine with ROSC.  Post-event she had normal blood pressures but increased WOB with wheezes, rales, and increased oxygen requirement.  Critical care was consulted and BIPAP is being established pending their arrival.      Assessment & Plan:   Principal Problem:   HCAP (healthcare-associated pneumonia) Active Problems:   ESRD on dialysis (HCC)   Anemia of chronic renal failure, stage 5 (HCC)   Protein-calorie malnutrition, severe   Acute  cholecystitis   Thrombocytopenia (HCC)  Acute respiratory arrest followed by PEA.  Rales and wheeze on exam.  I suspect that this is related to  -  CXR pending -  BIPAP being set up -  ABG pending -  Critical care consult -  ECG  -  troponins -  Possible PE, but has been on DVT prophylaxis since admission -  Palliative care has been consulted  Sepsis due to HCAP, with lactic acidosis, hypotension, leukocytosis.  WBC trending down.   - Check MRSA PCR:  Negative - Continue cefepime - Sputum cultures - Blood cultures NGTD -  Tele:  Paced rhythm -  PPM interrogation  Chronic hypotension -  Increase midodrine to 15mg  TID  Cholecystitis -Has perc drain in place since 3/9 -Does not appear to be the source of her infection -had recent appointment with IR.  Planning to continue for two more weeks and then perform clamping trial.  Severe protein calorie malnutrition, 35-lb weight loss over last 5 months -  Liberalize diet -  Supplements -  Nutrition consultation  ESRD with renal insuffiency anemia -Hgb 11.3, improved -Nephrology consult  Diabetes mellitus type 2 with A1c 5, diet controlled.  At risk for hypoglycemia  Thrombocytopenia, platelets stable near 100,000  DVT prophylaxis: Lovenox  Code Status: Full Family Communication:  Spoke on phone to sister.   Disposition Plan:  Poor prognosis given weight loss, recurrent admissions, ongoing hypotension with HD.  Palliative care consult placed.  Will transfer to ICU.    Consultants:   Nephrology  Palliative care  PCCM  Procedures:  None  Antimicrobials:  Anti-infectives  Start     Dose/Rate Route Frequency Ordered Stop   03/29/17 1200  vancomycin (VANCOCIN) 500 mg in sodium chloride 0.9 % 100 mL IVPB  Status:  Discontinued     500 mg 100 mL/hr over 60 Minutes Intravenous Every T-Th-Sa (Hemodialysis) 03/27/17 1754 03/28/17 1028   03/27/17 1800  ceFEPIme (MAXIPIME) 2 g in dextrose 5 % 50 mL IVPB     2 g 100  mL/hr over 30 Minutes Intravenous  Once 03/27/17 1746 03/27/17 1916   03/27/17 1800  vancomycin (VANCOCIN) IVPB 1000 mg/200 mL premix     1,000 mg 200 mL/hr over 60 Minutes Intravenous  Once 03/27/17 1746 03/27/17 1916   03/27/17 1800  ceFEPIme (MAXIPIME) 2 g in dextrose 5 % 50 mL IVPB     2 g 100 mL/hr over 30 Minutes Intravenous Every T-Th-Sa (1800) 03/27/17 1754         Subjective: Respiratory arrest about 10-20 minutes after starting dialysis followed by PEA.  Awake and alert post event.  SOB.  Starting bipap.  Denies chest pains.    Objective: Vitals:   03/28/17 2006 03/28/17 2100 03/29/17 0444 03/29/17 0720  BP:  (!) 86/60 96/64 117/81  Pulse: 81 80 70 71  Resp: 18  18 18   Temp:  97.4 F (36.3 C) 97.4 F (36.3 C) 97.6 F (36.4 C)  TempSrc:  Oral Oral Oral  SpO2: 98% 98% 97% 97%  Weight:    60.4 kg (133 lb 2.5 oz)  Height:        Intake/Output Summary (Last 24 hours) at 03/29/17 0834 Last data filed at 03/29/17 0601  Gross per 24 hour  Intake              600 ml  Output               54 ml  Net              546 ml   Filed Weights   03/27/17 2023 03/28/17 1900 03/29/17 0720  Weight: 59.3 kg (130 lb 11.2 oz) 60.5 kg (133 lb 6.4 oz) 60.4 kg (133 lb 2.5 oz)    Examination:  General exam:  Cachectic adult female, severe bitemporal wasting, respiratory distress with SCM, supraclavicular retractions HEENT:  NCAT, MMM Respiratory system: Coarse rales bilaterally with full expiratory wheeze, no rhonchi Cardiovascular system: IRRR 2/6 systolic murmur.  Cool extremities.   Gastrointestinal system: Normal active bowel sounds, soft, nondistended, nontender. MSK:  Normal tone and bulk, no lower extremity edema Neuro:  Grossly moving all extremities Psych:  Alert and oriented to situation post event.      Data Reviewed: I have personally reviewed following labs and imaging studies  CBC:  Recent Labs Lab 03/27/17 1639 03/28/17 0656  WBC 15.9* 8.3  NEUTROABS 14.0*  7.5  HGB 11.3* 10.6*  HCT 37.5 34.8*  MCV 83.3 82.7  PLT 109* 101*   Basic Metabolic Panel:  Recent Labs Lab 03/27/17 1639 03/28/17 0656 03/29/17 0750  NA 135 136 137  K 4.0 4.8 4.9  CL 96* 99* 99*  CO2 20* 19* 23  GLUCOSE 79 224* 107*  BUN 39* 45* 62*  CREATININE 6.88* 7.35* 8.15*  CALCIUM 8.1* 8.3* 8.4*  PHOS  --   --  8.2*   GFR: Estimated Creatinine Clearance: 5.9 mL/min (A) (by C-G formula based on SCr of 8.15 mg/dL (H)). Liver Function Tests:  Recent Labs Lab 03/27/17 1639 03/29/17 0750  AST 26  --   ALT  12*  --   ALKPHOS 145*  --   BILITOT 1.4*  --   PROT 8.6*  --   ALBUMIN 2.6* 2.3*   No results for input(s): LIPASE, AMYLASE in the last 168 hours. No results for input(s): AMMONIA in the last 168 hours. Coagulation Profile:  Recent Labs Lab 03/27/17 2042  INR 1.44   Cardiac Enzymes: No results for input(s): CKTOTAL, CKMB, CKMBINDEX, TROPONINI in the last 168 hours. BNP (last 3 results) No results for input(s): PROBNP in the last 8760 hours. HbA1C: No results for input(s): HGBA1C in the last 72 hours. CBG:  Recent Labs Lab 03/27/17 2018 03/28/17 0756 03/28/17 1206 03/28/17 1711 03/28/17 2039  GLUCAP 161* 221* 245* 195* 144*   Lipid Profile: No results for input(s): CHOL, HDL, LDLCALC, TRIG, CHOLHDL, LDLDIRECT in the last 72 hours. Thyroid Function Tests: No results for input(s): TSH, T4TOTAL, FREET4, T3FREE, THYROIDAB in the last 72 hours. Anemia Panel: No results for input(s): VITAMINB12, FOLATE, FERRITIN, TIBC, IRON, RETICCTPCT in the last 72 hours. Urine analysis:    Component Value Date/Time   COLORURINE AMBER (A) 07/15/2016 1636   APPEARANCEUR TURBID (A) 07/15/2016 1636   LABSPEC 1.025 07/15/2016 1636   PHURINE 5.0 07/15/2016 1636   GLUCOSEU NEGATIVE 07/15/2016 1636   HGBUR MODERATE (A) 07/15/2016 1636   BILIRUBINUR MODERATE (A) 07/15/2016 1636   KETONESUR 15 (A) 07/15/2016 1636   PROTEINUR 100 (A) 07/15/2016 1636   NITRITE  NEGATIVE 07/15/2016 1636   LEUKOCYTESUR MODERATE (A) 07/15/2016 1636   Sepsis Labs: @LABRCNTIP (procalcitonin:4,lacticidven:4)  ) Recent Results (from the past 240 hour(s))  Blood Culture (routine x 2)     Status: None (Preliminary result)   Collection Time: 03/27/17  6:01 PM  Result Value Ref Range Status   Specimen Description BLOOD LEFT ANTECUBITAL  Final   Special Requests IN PEDIATRIC BOTTLE Blood Culture adequate volume  Final   Culture NO GROWTH < 24 HOURS  Final   Report Status PENDING  Incomplete  Blood Culture (routine x 2)     Status: None (Preliminary result)   Collection Time: 03/27/17  6:11 PM  Result Value Ref Range Status   Specimen Description BLOOD LEFT HAND  Final   Special Requests   Final    BOTTLES DRAWN AEROBIC ONLY Blood Culture adequate volume   Culture NO GROWTH < 24 HOURS  Final   Report Status PENDING  Incomplete  MRSA PCR Screening     Status: None   Collection Time: 03/28/17  7:42 AM  Result Value Ref Range Status   MRSA by PCR NEGATIVE NEGATIVE Final    Comment:        The GeneXpert MRSA Assay (FDA approved for NASAL specimens only), is one component of a comprehensive MRSA colonization surveillance program. It is not intended to diagnose MRSA infection nor to guide or monitor treatment for MRSA infections.       Radiology Studies: Dg Chest 2 View  Result Date: 03/27/2017 CLINICAL DATA:  Productive cough and midline and left-sided chest pain for the past week. Former smoker. History of CHF, COPD, end-stage renal disease on dialysis, and coronary artery disease. EXAM: CHEST  2 VIEW COMPARISON:  PA and lateral chest x-ray of March 03, 2017 FINDINGS: The cardiac silhouette remains enlarged. The central pulmonary vascularity is mildly prominent. The interstitial markings are increased especially on the right. There is no alveolar infiltrate. There is no pleural effusion. There is calcification in the wall of the aortic arch. The dialysis catheter  tip projects at the cavoatrial junction. The ICD electrode is in reasonable position. IMPRESSION: Improved aeration of both lungs since the previous study. Cardiomegaly with mild pulmonary vascular congestion and mild interstitial edema greatest on the right. No alveolar pneumonia. Thoracic aortic atherosclerosis. Electronically Signed   By: David  [place] M.D.   On: 03/27/2017 15:49     Scheduled Meds: . amiodarone  200 mg Oral Daily  . atorvastatin  10 mg Oral QHS  . budesonide  2 mL Inhalation BID  . enoxaparin (LOVENOX) injection  30 mg Subcutaneous Q24H  . famotidine  20 mg Oral QHS  . feeding supplement (PRO-STAT SUGAR FREE 64)  30 mL Oral BID  . heparin  6,000 Units Dialysis Once in dialysis  . insulin aspart  0-9 Units Subcutaneous TID WC  . lactose free nutrition  237 mL Oral BID BM  . midodrine  15 mg Oral TID WC  . mirtazapine  7.5 mg Oral QHS  . multivitamin  1 tablet Oral QHS  . sertraline  25 mg Oral QHS   Continuous Infusions: . sodium chloride    . sodium chloride    . ceFEPime (MAXIPIME) IV       LOS: 2 days    Time spent: 30 min    Renae Fickle, MD [facility] Pager [phone]  If 7PM-7AM, please contact night-coverage [email or web address] Password [facility] 03/29/2017, 8:34 AM

## 2017-03-29 NOTE — Progress Notes (Signed)
[facility] Progress Note   Subjective: "Please hold my hand".  Patient started on hemodialysis this AM per schedule. 27 minutes into treatment, patient became unresponsive, PEA arrest. Code Blue called, CPR initiated. Rec'd 1 am epi with ROSC in about 4 minutes. Patient awake, agitated, but does respond to verbal. Labs pending, PCCM has been called.   Objective Vitals:   03/28/17 2006 03/28/17 2100 03/29/17 0444 03/29/17 0720  BP:  (!) 86/60 96/64 117/81  Pulse: 81 80 70 71  Resp: 18  18 18   Temp:  97.4 F (36.3 C) 97.4 F (36.3 C) 97.6 F (36.4 C)  TempSrc:  Oral Oral Oral  SpO2: 98% 98% 97% 97%  Weight:    60.4 kg (133 lb 2.5 oz)  Height:       Physical Exam General: Chronically ill appearing with Respiratory distress Heart: S1,S2, 2/6 systolic M Lungs: BBS with inspiratory wheezing, bibasilar crackles, WOB present.  Abdomen:soft actove BS Extremities: No LE edema Dialysis Access: L thigh AVG maturing + bruit. RIJ TDC blood lines connted  Additional Objective Labs: Basic Metabolic Panel:  Recent Labs Lab 03/27/17 1639 03/28/17 0656 03/29/17 0750  NA 135 136 137  K 4.0 4.8 4.9  CL 96* 99* 99*  CO2 20* 19* 23  GLUCOSE 79 224* 107*  BUN 39* 45* 62*  CREATININE 6.88* 7.35* 8.15*  CALCIUM 8.1* 8.3* 8.4*  PHOS  --   --  8.2*   Liver Function Tests:  Recent Labs Lab 03/27/17 1639 03/29/17 0750  AST 26  --   ALT 12*  --   ALKPHOS 145*  --   BILITOT 1.4*  --   PROT 8.6*  --   ALBUMIN 2.6* 2.3*   CBC:  Recent Labs Lab 03/27/17 1639 03/28/17 0656  WBC 15.9* 8.3  NEUTROABS 14.0* 7.5  HGB 11.3* 10.6*  HCT 37.5 34.8*  MCV 83.3 82.7  PLT 109* 101*   Blood Culture    Component Value Date/Time   SDES BLOOD LEFT HAND 03/27/2017 1811   SPECREQUEST  03/27/2017 1811    BOTTLES DRAWN AEROBIC ONLY Blood Culture adequate volume   CULT NO GROWTH < 24 HOURS 03/27/2017 1811   REPTSTATUS PENDING 03/27/2017 1811     CBG:  Recent Labs Lab  03/27/17 2018 03/28/17 0756 03/28/17 1206 03/28/17 1711 03/28/17 2039  GLUCAP 161* 221* 245* 195* 144*   Studies/Results: Dg Chest 2 View  Result Date: 03/27/2017 CLINICAL DATA:  Productive cough and midline and left-sided chest pain for the past week. Former smoker. History of CHF, COPD, end-stage renal disease on dialysis, and coronary artery disease. EXAM: CHEST  2 VIEW COMPARISON:  PA and lateral chest x-ray of March 03, 2017 FINDINGS: The cardiac silhouette remains enlarged. The central pulmonary vascularity is mildly prominent. The interstitial markings are increased especially on the right. There is no alveolar infiltrate. There is no pleural effusion. There is calcification in the wall of the aortic arch. The dialysis catheter tip projects at the cavoatrial junction. The ICD electrode is in reasonable position. IMPRESSION: Improved aeration of both lungs since the previous study. Cardiomegaly with mild pulmonary vascular congestion and mild interstitial edema greatest on the right. No alveolar pneumonia. Thoracic aortic atherosclerosis. Electronically Signed   By: David  [place] M.D.   On: 03/27/2017 15:49   Medications: . sodium chloride    . sodium chloride    . ceFEPime (MAXIPIME) IV     . amiodarone  200 mg Oral Daily  . atorvastatin  10 mg Oral QHS  . budesonide  2 mL Inhalation BID  . enoxaparin (LOVENOX) injection  30 mg Subcutaneous Q24H  . famotidine  20 mg Oral QHS  . feeding supplement (PRO-STAT SUGAR FREE 64)  30 mL Oral BID  . heparin  6,000 Units Dialysis Once in dialysis  . insulin aspart  0-9 Units Subcutaneous TID WC  . lactose free nutrition  237 mL Oral BID BM  . midodrine  15 mg Oral TID WC  . mirtazapine  7.5 mg Oral QHS  . multivitamin  1 tablet Oral QHS  . sertraline  25 mg Oral QHS   Dialysis Orders: [place],S 4 hrs 180 NRe 60kg  400/800 3.0 K/2.5 Ca UF profile 2 Heparin 6000 units IV TIW Mircera 150 mcg IV q 2 weeks (last dose 03/27/17 HGB 10.7 WBC  11.3 Ferritin 1374 Fe 57 Tsat 24% 03/22/17) No VDRA  Assessment/Plan: 1. PEA Arrest: S/P Code blue-awake on Bipap. PCCM consulted. Transport to ICU  2. ESRD -Attempted HD-treatment currently on hold. 3. Anemia - HGB 10.6 Follow HGB 4. Secondary hyperparathyroidism -no binders/VDRA. Phos 8.2 Ca 8.4 5. HTN/volume - BP 108/65. Was attempting UFG 1.5. Unable to remove any fluid. rec'd about 1 liter during Code.  PCXR pending 6. Nutrition -NPO at present 7. EOL care: Palliative care was consulted yesterday per primary. PCCM to have conversation with patient and family and establish goals of care.   Rita H. Brown NP-C 03/29/2017, 8:33 AM  [facility] [phone]  Pt seen, examined and agree w A/P as above. Pt had arrest this morning.  She had brief CPR and then ROSC.  It awake and alert.  Seen by CCM.  Now is DNR.  Agree not CRRT candidate. Have d/w patient the seriousness of her situation with regards to risk of recurrent problems on dialysis given her heart disease and other comorbidities. She is going to think about this and is actively discussing with pall care team.   Vinson Moselle MD [facility] pager [phone]   03/29/2017, 12:19 PM

## 2017-03-30 ENCOUNTER — Inpatient Hospital Stay (HOSPITAL_COMMUNITY): Payer: Medicare PPO

## 2017-03-30 DIAGNOSIS — I469 Cardiac arrest, cause unspecified: Secondary | ICD-10-CM

## 2017-03-30 DIAGNOSIS — I342 Nonrheumatic mitral (valve) stenosis: Secondary | ICD-10-CM

## 2017-03-30 LAB — GLUCOSE, CAPILLARY
GLUCOSE-CAPILLARY: 105 mg/dL — AB (ref 65–99)
GLUCOSE-CAPILLARY: 122 mg/dL — AB (ref 65–99)
Glucose-Capillary: 129 mg/dL — ABNORMAL HIGH (ref 65–99)
Glucose-Capillary: 114 mg/dL — ABNORMAL HIGH (ref 65–99)

## 2017-03-30 LAB — CBC
HCT: 38.1 % (ref 36.0–46.0)
HEMOGLOBIN: 11.3 g/dL — AB (ref 12.0–15.0)
MCH: 25 pg — AB (ref 26.0–34.0)
MCHC: 29.7 g/dL — ABNORMAL LOW (ref 30.0–36.0)
MCV: 84.3 fL (ref 78.0–100.0)
PLATELETS: 59 10*3/uL — AB (ref 150–400)
RBC: 4.52 MIL/uL (ref 3.87–5.11)
RDW: 21.3 % — ABNORMAL HIGH (ref 11.5–15.5)
WBC: 8 10*3/uL (ref 4.0–10.5)

## 2017-03-30 LAB — RENAL FUNCTION PANEL
ANION GAP: 19 — AB (ref 5–15)
Albumin: 2.3 g/dL — ABNORMAL LOW (ref 3.5–5.0)
BUN: 61 mg/dL — ABNORMAL HIGH (ref 6–20)
CHLORIDE: 99 mmol/L — AB (ref 101–111)
CO2: 20 mmol/L — AB (ref 22–32)
CREATININE: 7.92 mg/dL — AB (ref 0.44–1.00)
Calcium: 8.2 mg/dL — ABNORMAL LOW (ref 8.9–10.3)
GFR, EST AFRICAN AMERICAN: 5 mL/min — AB (ref >60)
GFR, EST NON AFRICAN AMERICAN: 5 mL/min — AB (ref >60)
Glucose, Bld: 89 mg/dL (ref 65–99)
Phosphorus: 8 mg/dL — ABNORMAL HIGH (ref 2.5–4.6)
Potassium: 4.4 mmol/L (ref 3.5–5.1)
Sodium: 138 mmol/L (ref 135–145)

## 2017-03-30 LAB — MAGNESIUM: MAGNESIUM: 2.2 mg/dL (ref 1.7–2.4)

## 2017-03-30 LAB — ECHOCARDIOGRAM COMPLETE
HEIGHTINCHES: 64 in
WEIGHTICAEL: 2130.53 [oz_av]

## 2017-03-30 LAB — PROCALCITONIN: Procalcitonin: 1.68 ng/mL

## 2017-03-30 MED ORDER — LORAZEPAM 1 MG PO TABS: 1.0000 mg | ORAL_TABLET | ORAL | 0 refills | Status: AC | PRN

## 2017-03-30 MED ORDER — LORAZEPAM 1 MG PO TABS
1.0000 mg | ORAL_TABLET | ORAL | Status: DC | PRN
  Administered 2017-03-31 – 2017-04-01 (×2): 1 mg via ORAL
  Filled 2017-03-30 (×2): qty 1

## 2017-03-30 MED ORDER — HYPROMELLOSE (GONIOSCOPIC) 2.5 % OP SOLN
1.0000 [drp] | Freq: Two times a day (BID) | OPHTHALMIC | Status: DC
  Administered 2017-03-30 – 2017-04-01 (×6): 1 [drp] via OPHTHALMIC
  Filled 2017-03-30 (×2): qty 15

## 2017-03-30 NOTE — Discharge Summary (Addendum)
Physician Discharge Summary  Brandy Dudley MBE:[id number] DOB: 1951/07/27 DOA: 03/27/2017  PCP: Brandy Serve, MD  Admit date: 03/27/2017 Discharge date: 03/31/2017  Admitted From: [facility]  Disposition:  Residential hospice  Discharge Condition:  Stable, improved CODE STATUS:  DNR  Diet recommendation:  regular   Brief/Interim Summary:  Brandy Dudley a 66 y.o.femalewith medical history significant of CHF with EF 30%, AICD / pacer present, COPD, DM2 causing ESRD with dialysis TTS with recent admissions (4/7-9 for syncope - hypovolemic during dialysis; 3/23-3/26 for hypovolemic vs. Septic shock; 3/8-13 for acute cholecystitis with resultant placement of IR drain still in place) who presented after hypotension during dialysis today which caused the session to be shortened. She also had nausea, vomiting, and increased cough without fever or chills.  In the emergency department, her lactate was greater than 2 which was likely due to hypotension. Her white blood cell count was 15.9 and chest x-ray was concerning for healthcare associated pneumonia. She was given vancomycin and cefepime.  Blood cultures are no growth to date. She clinically improved on her IV antibiotics but continued to have low normal to hypotensive blood pressures despite midodrine.  On 5/3, after being on HD for 20 minutes, she developed respiratory arrest.  A code blue was called.  She was being supported with bag-mask ventilation when she went into PEA arrest.  She had 2-3 minutes of chest compressions and 1 dose of epinephrine with ROSC.  Post-event she had normal blood pressures but increased WOB with wheezes, rales, and increased oxygen requirement.  Critical care was consulted and BIPAP was established prior to their arrival.  She was intolerant of bipap and it was removed.  She was transferred to the ICU where she met with critical care and palliative care and discussed her options.  She elected to be DNR.  She was transferred back  to telemetry under hospitalist service.  She has had syncope or near syncope with almost every HD session and now had cardiac arrest with the most recent.  Given the stress on her body from her recent cardiac arrest, it is likely she could arrest again with another attempt at HD.  We have recommended hospice care and cessation of HD so that she can spend more time with her family in comfort and she agrees.     Discharge Diagnoses:  Principal Problem:   HCAP (healthcare-associated pneumonia) Active Problems:   ESRD on dialysis (Brandy Dudley)   Anemia of chronic renal failure, stage 5 (HCC)   Protein-calorie malnutrition, severe   Acute cholecystitis   Thrombocytopenia (HCC)   Respiratory failure (HCC)   Respiratory arrest (Brandy Dudley)   Goals of care, counseling/discussion   Palliative care by specialist   Cardiopulmonary arrest with successful resuscitation (Brandy Dudley)  Acute respiratory arrest followed by PEA.  ROSC after 2-3 minutes of chest compressions and one dose of epinephrine.   -  Now DNR -  Appreciate palliative care and PCCM assistance   -  Continue oxygen via nasal canula for comfort  Minimally elevated troponins likely due to demand ischemia due to cardiac arrest -  No further work up at this time  PAF and VT s/p AICD -  D/c amiodarone to emphasize comfort (long half life) - Not a good candidate for systemic anticoagualtion  Chronic systolic heart failure, EF 35 - 40% with grade 1 DD, mild MR, mildly dilated RV, right atrium massively dilated with moderate TR - Continue volume management with HD  Sepsis due to HCAP, with lactic acidosis,  hypotension, leukocytosis. Leukocytosis resolved.  - Check MRSA PCR:  Negative - Discontinue cefepime, emphasizing comfort - Blood cultures NGTD  Chronic hypotension -  midodrine discontinued  Cholecystitis, has perc drain in place since 3/9 -Does not appear to be the source of her infection  Severe protein calorie malnutrition, 35-lb  weight loss over last 5 months -  Liberalized diet  ESRD with renal insuffiency anemia -  Hgb 11.3, improved - attempted HD on 5/3 but she had cardiopulmonary arrest about 20 minutes into HD -  no further attempts at HD  Diabetes mellitus type 2 with A1c 5, diet controlled.  At risk for hypoglycemia  Thrombocytopenia, platelets trending down post arrest -  No further blood work  Recent history of C. Diff, oral vancomycin now complete  Hx of DVT,but no longer on anticoagulation as deemed high risk  COPD, continue 2L oxygen, spiriva, albuterol prn  Discharge Instructions  Discharge Instructions    Diet general    Complete by:  As directed    Increase activity slowly    Complete by:  As directed        Medication List    STOP taking these medications   amiodarone 200 MG tablet Commonly known as:  PACERONE   atorvastatin 10 MG tablet Commonly known as:  LIPITOR   CERTAVITE/ANTIOXIDANTS Tabs   ENSURE CLEAR Liqd   FIBER CHOICE PO   insulin lispro 100 UNIT/ML injection Commonly known as:  HUMALOG   lactose free nutrition Liqd   midodrine 10 MG tablet Commonly known as:  PROAMATINE   nitroGLYCERIN 0.4 MG SL tablet Commonly known as:  NITROSTAT   vancomycin 125 MG capsule Commonly known as:  VANCOCIN     TAKE these medications   acetaminophen 325 MG tablet Commonly known as:  TYLENOL Take 325 mg by mouth every 6 (six) hours as needed for mild pain or fever (For fever >99.5).   famotidine 20 MG tablet Commonly known as:  PEPCID Take 20 mg by mouth at bedtime.   fluticasone 110 MCG/ACT inhaler Commonly known as:  FLOVENT HFA Inhale 2 puffs into the lungs 2 (two) times daily.   guaiFENesin-dextromethorphan 100-10 MG/5ML syrup Commonly known as:  ROBITUSSIN DM Take 5 mLs by mouth every 4 (four) hours as needed for cough.   hydrOXYzine 50 MG tablet Commonly known as:  ATARAX/VISTARIL Take 50 mg by mouth every 6 (six) hours as needed for itching.    LORazepam 1 MG tablet Commonly known as:  ATIVAN Take 1 tablet (1 mg total) by mouth every 4 (four) hours as needed for anxiety or sleep.   mirtazapine 15 MG tablet Commonly known as:  REMERON Take 7.5 mg by mouth at bedtime.   oxyCODONE-acetaminophen 5-325 MG tablet Commonly known as:  PERCOCET/ROXICET Take 1 tablet by mouth every 6 (six) hours as needed (for mild pain).   OXYGEN Inhale 2 L into the lungs as needed (for shortness of breath).   PROAIR HFA 108 (90 Base) MCG/ACT inhaler Generic drug:  albuterol Inhale 2 puffs into the lungs every 6 (six) hours as needed for wheezing or shortness of breath.   promethazine 25 MG tablet Commonly known as:  PHENERGAN Take 25 mg by mouth every 6 (six) hours as needed for nausea or vomiting.   sertraline 25 MG tablet Commonly known as:  ZOLOFT Take 25 mg by mouth at bedtime.   SYSTANE BALANCE 0.6 % Soln Generic drug:  Propylene Glycol Place 1 drop into both eyes 2 (two)  times daily.   tiotropium 18 MCG inhalation capsule Commonly known as:  SPIRIVA Place 1 capsule (18 mcg total) into inhaler and inhale daily.      Follow-up Information    Brandy Serve, MD Follow up.   Specialty:  Internal Medicine Contact information: [address] [place] [address] [phone]          Allergies  Allergen Reactions  . Entresto [Sacubitril-Valsartan] Other (See Comments)    Reaction not noted on MAR   . Latex Itching  . Sulfa Antibiotics Itching    Consultations:  Critical care Nephrology Palliative care   Procedures/Studies: Dg Chest 2 View  Result Date: 03/27/2017 CLINICAL DATA:  Productive cough and midline and left-sided chest pain for the past week. Former smoker. History of CHF, COPD, end-stage renal disease on dialysis, and coronary artery disease. EXAM: CHEST  2 VIEW COMPARISON:  PA and lateral chest x-ray of March 03, 2017 FINDINGS: The cardiac silhouette remains enlarged. The central pulmonary  vascularity is mildly prominent. The interstitial markings are increased especially on the right. There is no alveolar infiltrate. There is no pleural effusion. There is calcification in the wall of the aortic arch. The dialysis catheter tip projects at the cavoatrial junction. The ICD electrode is in reasonable position. IMPRESSION: Improved aeration of both lungs since the previous study. Cardiomegaly with mild pulmonary vascular congestion and mild interstitial edema greatest on the right. No alveolar pneumonia. Thoracic aortic atherosclerosis. Electronically Signed   By: David  Martinique M.D.   On: 03/27/2017 15:49   Dg Chest 2 View  Result Date: 03/03/2017 CLINICAL DATA:  Status post syncope during dialysis. Acute onset of left-sided and central chest pain. Initial encounter. EXAM: CHEST  2 VIEW COMPARISON:  Chest radiograph performed 02/19/2017 FINDINGS: The lungs are hypoexpanded. No pleural effusion or pneumothorax is seen. Minimal bilateral atelectasis is noted. The cardiomediastinal silhouette is mildly enlarged. A right-sided dual-lumen catheter is noted ending about the proximal right atrium. An AICD is noted at the left chest wall, with a single lead ending at the right ventricle. No acute osseous abnormalities are identified. IMPRESSION: Lungs hypoexpanded. Minimal bilateral atelectasis seen. Mild cardiomegaly. Electronically Signed   By: Garald Balding M.D.   On: 03/03/2017 17:51   Ct Head Wo Contrast  Result Date: 03/03/2017 CLINICAL DATA:  Syncope and seizure while at dialysis today EXAM: CT HEAD WITHOUT CONTRAST TECHNIQUE: Contiguous axial images were obtained from the base of the skull through the vertex without intravenous contrast. COMPARISON:  02/19/2017 FINDINGS: Brain: Age-appropriate involutional changes. No acute large vascular territory infarction, hemorrhage nor midline shift. No intra-axial mass nor extra-axial fluid. Minimal small vessel ischemic changes of periventricular white  matter. No effacement of the basal cisterns. Fourth ventricle is midline. There is no hydrocephalus. Vascular: Right vertebral and bilateral carotid siphon calcifications. No unexpected calcifications are hyperdense vessels. Skull: Normal. Negative for fracture or focal lesion. Sinuses/Orbits:  Intact orbits and globes.  Mild membrane thickening of the ethmoid sinus. Other: Mastoids are clear. IMPRESSION: Chronic stable small vessel ischemic changes of periventricular white matter. No acute intracranial abnormality. Electronically Signed   By: Ashley Royalty M.D.   On: 03/03/2017 19:24   Dg Chest Port 1 View  Result Date: 03/30/2017 CLINICAL DATA:  Respiratory failure with cough. EXAM: PORTABLE CHEST 1 VIEW COMPARISON:  03/29/2017. FINDINGS: The markedly enlarged cardiac silhouette. Unchanged single lead pacer. Mild vascular congestion. No frank edema or consolidation. Previously noted area of early consolidation or atelectasis RIGHT upper lobe  appears improved. IMPRESSION: Improved aeration.  Marked cardiomegaly. Electronically Signed   By: Staci Righter M.D.   On: 03/30/2017 07:28   Dg Chest Port 1 View  Result Date: 03/29/2017 CLINICAL DATA:  Respiratory arrest in dialysis. Hx of AICD, cardiomyopathy, CHF, COPD, CAD, diabetes, hypertension, AV fistula placement. Former smoker(2007). EXAM: PORTABLE CHEST 1 VIEW COMPARISON:  03/27/2017 FINDINGS: In the right upper lobe, anteriorly adjacent to the minor fissure, there is focal hazy airspace opacity which has developed since the prior exam. Remainder of the lungs is clear. Mild to moderate enlargement of the cardiopericardial silhouette is stable. No mediastinal or hilar masses. Stable left anterior chest wall pacemaker. Right internal jugular tunneled dual lumen central venous line is also stable. No pneumothorax. IMPRESSION: 1. Small area of new airspace opacity in the right upper lobe. This is consistent with pneumonia in the proper clinical setting.  Alternatively, may reflect atelectasis. 2. No other evidence of acute cardiopulmonary disease. Electronically Signed   By: Lajean Manes M.D.   On: 03/29/2017 09:22   Dg Cholangiogram  Existing Tube  Result Date: 03/14/2017 INDICATION: Evaluate cholecystostomy tube.  History of cholecystitis. EXAM: CHOLANGIOGRAM THROUGH EXISTING CATHETER MEDICATIONS: None ANESTHESIA/SEDATION: None FLUOROSCOPY TIME:  Fluoroscopy Time: 1 minutes and 6 seconds, 71 mGy COMPLICATIONS: None immediate. PROCEDURE: Patient was placed supine on the fluoroscopic table. Scout image was obtained. The gallbladder drain was injected with contrast. 20 mL of Omnipaque 300 was injected. Drain was attached to gravity bag after the injection. FINDINGS: Gallbladder is positioned in the gallbladder. There is no leakage around the tube. The cystic duct is patent and there is contrast filling the common bile duct and duodenum. No large stones or filling defects identified in either the gallbladder or extrahepatic biliary system. IMPRESSION: Cystic duct and extrahepatic biliary system are patent. No large stones or filling defects. Electronically Signed   By: Markus Daft M.D.   On: 03/14/2017 12:52    Subjective:  Still having some soreness in her chest where she had chest compressions done yesterday. Denies shortness of breath, nausea. She states she feels scared. She is scared of dying. She is scared of trying hemodialysis again. She would like her family to be involved in decision making and they are arriving today.  Discharge Exam: Vitals:   03/31/17 0436 03/31/17 0928  BP: (!) 92/50 113/79  Pulse: 82 70  Resp: 18 17  Temp: 97.4 F (36.3 C) 97.8 F (36.6 C)   Vitals:   03/30/17 2149 03/31/17 0436 03/31/17 0928 03/31/17 0943  BP: (!) 92/55 (!) 92/50 113/79   Pulse: 66 82 70   Resp: _0 Temp: 98 F (36.7 C) 97.4 F (36.3 C) 97.8 F (36.6 C)   TempSrc: Oral Oral Oral   SpO2: 100% 100% 98% 97%  Weight: 65 kg (143 lb 4.8  oz)     Height:         General exam:  Cachectic adult female, severe bitemporal wasting, no acute distress HEENT:  NCAT, MMM Respiratory system: Coarse rales bilateral bases, with wheezes and rhonchi Cardiovascular system: IRRR 2/6 systolic murmur.  Cool extremities.   Gastrointestinal system: Normal active bowel sounds, soft, nondistended, nontender. MSK:  Normal tone and bulk, trace lower extremity edema Neuro:  Grossly moving all extremities Psych:  Alert and oriented   The results of significant diagnostics from this hospitalization (including imaging, microbiology, ancillary and laboratory) are listed below for reference.     Microbiology: Recent Results (from the  past 240 hour(s))  Blood Culture (routine x 2)     Status: None (Preliminary result)   Collection Time: 03/27/17  6:01 PM  Result Value Ref Range Status   Specimen Description BLOOD LEFT ANTECUBITAL  Final   Special Requests IN PEDIATRIC BOTTLE Blood Culture adequate volume  Final   Culture NO GROWTH 4 DAYS  Final   Report Status PENDING  Incomplete  Blood Culture (routine x 2)     Status: None (Preliminary result)   Collection Time: 03/27/17  6:11 PM  Result Value Ref Range Status   Specimen Description BLOOD LEFT HAND  Final   Special Requests   Final    BOTTLES DRAWN AEROBIC ONLY Blood Culture adequate volume   Culture NO GROWTH 4 DAYS  Final   Report Status PENDING  Incomplete  MRSA PCR Screening     Status: None   Collection Time: 03/28/17  7:42 AM  Result Value Ref Range Status   MRSA by PCR NEGATIVE NEGATIVE Final    Comment:        The GeneXpert MRSA Assay (FDA approved for NASAL specimens only), is one component of a comprehensive MRSA colonization surveillance program. It is not intended to diagnose MRSA infection nor to guide or monitor treatment for MRSA infections.      Labs: BNP (last 3 results)  Recent Labs  07/15/16 1331 02/16/17 1644 03/29/17 0946  BNP 1,715.6* 2,422.0*  >9,735.3*   Basic Metabolic Panel:  Recent Labs Lab 03/27/17 1639 03/28/17 0656 03/29/17 0750 03/29/17 0946 03/30/17 0441  NA 135 136 137  --  138  K 4.0 4.8 4.9  --  4.4  CL 96* 99* 99*  --  99*  CO2 20* 19* 23  --  20*  GLUCOSE 79 224* 107*  --  89  BUN 39* 45* 62*  --  61*  CREATININE 6.88* 7.35* 8.15*  --  7.92*  CALCIUM 8.1* 8.3* 8.4*  --  8.2*  MG  --   --   --  2.1 2.2  PHOS  --   --  8.2* 6.9* 8.0*   Liver Function Tests:  Recent Labs Lab 03/27/17 1639 03/29/17 0750 03/30/17 0441  AST 26  --   --   ALT 12*  --   --   ALKPHOS 145*  --   --   BILITOT 1.4*  --   --   PROT 8.6*  --   --   ALBUMIN 2.6* 2.3* 2.3*   No results for input(s): LIPASE, AMYLASE in the last 168 hours. No results for input(s): AMMONIA in the last 168 hours. CBC:  Recent Labs Lab 03/27/17 1639 03/28/17 0656 03/29/17 0750 03/29/17 0946 03/30/17 0441  WBC 15.9* 8.3 10.6* 13.8* 8.0  NEUTROABS 14.0* 7.5  --   --   --   HGB 11.3* 10.6* 10.9* 10.5* 11.3*  HCT 37.5 34.8* 36.7 34.7* 38.1  MCV 83.3 82.7 83.8 82.2 84.3  PLT 109* 101* 111* 77* 59*   Cardiac Enzymes:  Recent Labs Lab 03/29/17 0946 03/29/17 1446 03/29/17 1952  TROPONINI 0.08* 0.12* 0.18*   BNP: Invalid input(s): POCBNP CBG:  Recent Labs Lab 03/30/17 1216 03/30/17 1655 03/30/17 2146 03/31/17 0757 03/31/17 1143  GLUCAP 129* 114* 122* 136* 174*   D-Dimer No results for input(s): DDIMER in the last 72 hours. Hgb A1c No results for input(s): HGBA1C in the last 72 hours. Lipid Profile No results for input(s): CHOL, HDL, LDLCALC, TRIG, CHOLHDL, LDLDIRECT in the last 72  hours. Thyroid function studies No results for input(s): TSH, T4TOTAL, T3FREE, THYROIDAB in the last 72 hours.  Invalid input(s): FREET3 Anemia work up No results for input(s): VITAMINB12, FOLATE, FERRITIN, TIBC, IRON, RETICCTPCT in the last 72 hours. Urinalysis    Component Value Date/Time   COLORURINE AMBER (A) 07/15/2016 1636    APPEARANCEUR TURBID (A) 07/15/2016 1636   LABSPEC 1.025 07/15/2016 1636   PHURINE 5.0 07/15/2016 1636   GLUCOSEU NEGATIVE 07/15/2016 1636   HGBUR MODERATE (A) 07/15/2016 1636   BILIRUBINUR MODERATE (A) 07/15/2016 1636   KETONESUR 15 (A) 07/15/2016 1636   PROTEINUR 100 (A) 07/15/2016 1636   NITRITE NEGATIVE 07/15/2016 1636   LEUKOCYTESUR MODERATE (A) 07/15/2016 1636   Sepsis Labs Invalid input(s): PROCALCITONIN,  WBC,  LACTICIDVEN   Time coordinating discharge: Over 30 minutes  SIGNED:   Janece Canterbury, MD  [facility] 03/31/2017, 3:37 PM Pager   If 7PM-7AM, please contact night-coverage [email or web address] Password [email or web address]

## 2017-03-30 NOTE — Progress Notes (Signed)
PT Cancellation Note  Patient Details Name: Brandy Dudley MRN: [id number] DOB: [birth year]   Cancelled Treatment:    Reason Eval/Treat Not Completed: Other (comment). Palliative care in for consultation. Will check back as time allows if want to continue with therapy.   Colin Broach PT, DPT  [phone]  03/30/2017, 3:27 PM

## 2017-03-30 NOTE — Progress Notes (Signed)
[facility] Liaison RN Visit  Received request from [place] for patient interest in [facility] for possible transfer today.  Chart reviewed and received report from bedside RN. Met with patient to confirm interest and explain services.  Patient states "familiar with hospice - my sister was in hospice and another sister works in the [place]" and verbalized "desire for comfort care at this time".  Patient confirmed interest in [facility] and would like sister [name] to be present to support her decision. Per patient wishes, left [facility] information and contact numbers and will await to hear from patient and family when sister arrives at hospital this afternoon to answer any further questions.   Thank you for the referral,  Gar Ponto, [facility] Liaison  [phone]

## 2017-03-30 NOTE — Clinical Social Work Note (Addendum)
CSW advised by MD that patient has stopped dialysis and is now comfort care. Attending MD talked with patient this morning and she is agreeable to residential hospice. Patient is agreeable to [facility], however sister [name] is requesting [facility]. [facility] has one bed and can take patient today. Clinicals sent to [facility] and CSW waiting to hear if they can take patient today.   Attending MD talked later this afternoon with CSW regarding comfort care and indicated that patient now wants family involved in decision. MD advised that she and Sarah with Palliative Care talked with some family, however there is more family coming. CSW also talked with Maralyn Sago with Palliative Care and she advised CSW that she talked with family at the bedside this afternoon and will meet tomorrow at 4 pm with more family regarding dialysis and other end of life concerns.  CSW will continue to follow and assist as needed with discharge planning and disposition.     Genelle Bal, MSW, LCSW Licensed Clinical Social Worker Clinical Social Work Department [employer] [phone]

## 2017-03-30 NOTE — Progress Notes (Signed)
OT Cancellation Note  Patient Details Name: Velva Marx MRN: [id number] DOB: 12/19/1950   Cancelled Treatment:    Reason Eval/Treat Not Completed: Other (comment). Goals of care meeting today. Pt apparently from SNF/[facility]. Will assess pt later if appropriate.  [facility] Thorin Starner, OT/L  [phone] 03/30/2017 03/30/2017, 10:39 AM

## 2017-03-30 NOTE — Progress Notes (Signed)
  Echocardiogram 2D Echocardiogram has been performed.  Brandy Dudley 03/30/2017, 1:14 PM

## 2017-03-30 NOTE — Progress Notes (Signed)
Pt's sister and sister-n-law here at bedside, they state that no one else is suppose to come, just them. Sarah with palliative had asked to be called when they arrived, [phone] called and message left that family was here.

## 2017-03-30 NOTE — Progress Notes (Signed)
Chris from [employer] called back, states pt's defibulator AICD was cut off yesterday. States a strip was printed out and given to the nurse that was caring for her then. Will look at pt's paper chart and see is strip was placed there.

## 2017-03-30 NOTE — Progress Notes (Signed)
Order for cutting off pt's AICD noted, contacted cath lab for number to [employer]--pt's AICD is a [employer] [place]. Number for [employer] is [phone].  Contacted rep at this number and that person is contacting the local rep for [facility] area and will return call to unit for further information. Name of pt and location given to [employer].

## 2017-03-30 NOTE — Progress Notes (Signed)
Daily Progress Note   Patient Name: Brandy Dudley       Date: 03/30/2017 DOB: 1951-08-16  Age: 66 y.o. MRN#: [id number] Attending Physician: Janece Canterbury, MD Primary Care Physician: Blanchie Serve, MD Admit Date: 03/27/2017  Reason for Consultation/Follow-up: Establishing goals of care, Hospice Evaluation, Non pain symptom management and Psychosocial/spiritual support  Subjective: Met with patient today as well as her sister and niece. They are anticipating a large family gathering for 03/31/2017. Patient is reporting some chest wall pain secondary to CPR that she received in dialysis on 03/29/2017.  Length of Stay: 3  Current Medications: Scheduled Meds:  . budesonide (PULMICORT) nebulizer solution  0.5 mg Nebulization BID  . famotidine  20 mg Oral QHS  . heparin  6,000 Units Dialysis Once in dialysis  . hydroxypropyl methylcellulose / hypromellose  1 drop Both Eyes BID  . insulin aspart  0-9 Units Subcutaneous TID WC  . ipratropium-albuterol  3 mL Nebulization TID  . mirtazapine  7.5 mg Oral QHS  . sertraline  25 mg Oral QHS    Continuous Infusions:   PRN Meds: albuterol, alteplase, heparin, heparin, hydrOXYzine, lidocaine-prilocaine, LORazepam, ondansetron (ZOFRAN) IV, oxyCODONE-acetaminophen, pentafluoroprop-tetrafluoroeth, promethazine  Physical Exam  Constitutional: She is oriented to person, place, and time.  Older female, who appears ill; in no acute distress  HENT:  Head: Normocephalic and atraumatic.  Cardiovascular: Normal rate.   Irregular  Pulmonary/Chest: Effort normal.  Musculoskeletal: Normal range of motion.  Neurological: She is alert and oriented to person, place, and time.  Skin: Skin is warm and dry.  Psychiatric: She has a normal mood and affect. Her  behavior is normal. Judgment and thought content normal.  Nursing note and vitals reviewed.           Vital Signs: BP 103/61 (BP Location: Left Arm)   Pulse 66   Temp 97.7 F (36.5 C) (Oral)   Resp 16   Ht '5\' 4"'  (1.626 m)   Wt 60.4 kg (133 lb 2.5 oz)   SpO2 99%   BMI 22.86 kg/m  SpO2: SpO2: 99 % O2 Device: O2 Device: Nasal Cannula O2 Flow Rate: O2 Flow Rate (L/min): 2 L/min  Intake/output summary:  Intake/Output Summary (Last 24 hours) at 03/30/17 1737 Last data filed at 03/30/17 1655  Gross per 24 hour  Intake  710 ml  Output              225 ml  Net              485 ml   LBM: Last BM Date: 03/29/17 Baseline Weight: Weight: 59.3 kg (130 lb 11.2 oz) Most recent weight: Weight: 60.4 kg (133 lb 2.5 oz)       Palliative Assessment/Data:    Flowsheet Rows     Most Recent Value  Intake Tab  Referral Department  Hospitalist  Unit at Time of Referral  Med/Surg Unit  Palliative Care Primary Diagnosis  Nephrology  Date Notified  03/28/17  Palliative Care Type  New Palliative care  Reason for referral  Clarify Goals of Care  Date of Admission  03/27/17  Date first seen by Palliative Care  03/29/17  # of days Palliative referral response time  1 Day(s)  # of days IP prior to Palliative referral  1  Clinical Assessment  Psychosocial & Spiritual Assessment  Palliative Care Outcomes      Patient Active Problem List   Diagnosis Date Noted  . Cardiopulmonary arrest with successful resuscitation ([place]) 03/30/2017  . Respiratory failure ([place]) 03/29/2017  . Respiratory arrest ([place])   . Goals of care, counseling/discussion   . Palliative care by specialist   . HCAP (healthcare-associated pneumonia) 03/27/2017  . Syncope 03/03/2017  . Thrombocytopenia ([place]) 03/03/2017  . PAF (paroxysmal atrial fibrillation) ([place]) 02/27/2017  . Hypotension 02/19/2017  . Shock ([place]) 02/17/2017  . Acute cholecystitis 02/02/2017  . Preoperative clearance   . Protein-calorie  malnutrition, severe 07/18/2016  . Atherosclerotic peripheral vascular disease ([place]) 07/17/2016  . Bacteremia 07/17/2016  . Depression 07/05/2016  . Umbilical hernia without obstruction or gangrene 07/05/2016  . Generalized anxiety disorder 07/05/2016  . Loss of weight 07/05/2016  . Chronic combined systolic and diastolic CHF (congestive heart failure) ([place]) 05/07/2016  . 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 Anemia of chronic renal failure, stage 5 ([facility]) 04/14/2016  . ESRD on dialysis ([place])   . History of Clostridium difficile colitis 03/20/2016  . COPD (chronic obstructive pulmonary disease) ([place]) 03/20/2016  . Obstructive sleep apnea 03/20/2016  . AICD (automatic cardioverter/defibrillator) present 03/20/2016  . Physical deconditioning 03/02/2016  . DVT (deep venous thrombosis), right 03/02/2016  . Cardiomyopathy, ischemic 03/02/2016  . Coronary artery disease involving native coronary artery of native heart without angina pectoris 03/02/2016    Palliative Care Assessment & Plan   Patient Profile:  66 y.o.femalewith medical history significant of CHF with EF 30%, AICD / pacer present, COPD, DM2 causing ESRD with dialysis TTS with recent admissions (4/7-9 for syncope - hypovolemic during dialysis; 3/23-3/26 for hypovolemic vs. Septic shock; 3/8-13 for acute cholecystitis with resultant placement of IR drain still in place) who presented after hypotension during dialysis today which caused the session to be shortened. She also had nausea, vomiting, and increased cough without fever or chills. In the emergency department, her lactate was greater than 2 which was likely due to hypotension. Her white blood cell count was 15.9 and chest x-ray was concerning for healthcare associated pneumonia. She was given vancomycin and cefepime. Blood cultures are no growth to date. She clinically improved on her IV antibiotics but continued to havelow normal to  hypotensive blood pressures despite midodrine. On 5/3, after being on HD for 20 minutes, she developed respiratory arrest. A code blue was called. She was being supported with bag-mask ventilation when she went  into PEA arrest. She had 2-3 minutes of chest compressions and 1 dose of epinephrine with ROSC. Post-event she had normal blood pressures but increased WOB with wheezes, rales, and increased oxygen requirement. Critical care was consulted and BIPAP wasestablished prior to their arrival. She was intolerant of bipap and it was removed. She was transferred to the ICU where she met with critical care and palliative care and discussed her options. She elected to be DNR. She was transferred back to telemetry under hospitalist service. She has had syncope or near syncope with almost every HD session and now had cardiac arrest with the most recent. Given the stress on her body from her recent cardiac arrest, it is likely she could arrest again with another attempt at HD.   Assessment: Met with patient, her sister Kathi Der, as well as niece, Angela Nevin, and Dr. Sheran Fava. Patient is alert and oriented 3 and able to participate in discussion. Chart reviewed specifically Charlynn Court, NP, with palliative medicine team assessment. Patient states that she is afraid to die. When I asked her if she afraid of what happens before death after death she clearly states "before". Patient describes her arrest in dialysis and states "I never want to go through that again". She was intolerant of BiPAP, stating it made her extremely claustrophobic. We are planning for a large family meeting with her other siblings who are coming from out of town for 03/31/2017 at 4 PM. During our conversation when Dr. Sheran Fava was present patient seemed more decisive in terms of stopping hemodialysis now and going forward with residential hospice. She is now stating that she wishes to have  family meeting tomorrow before her final  decision  Recommendations/Plan:  Hemodialysis is on hold for 03/30/2017 as well as 03/31/2017. Patient understands the risks but desires input from her other family members before her final decision.  She is complaining of itching. We'll start scheduled Neurontin 100 mg at bedtime  AICD has been deactivated  Patient is not full comfort care at this point  Goals of Care and Additional Recommendations:  Limitations on Scope of Treatment: Minimize Medications, No Artificial Feeding, No Chemotherapy, No Surgical Procedures and No Tracheostomy  Code Status:    Code Status Orders        Start     Ordered   03/29/17 0903  Do not attempt resuscitation (DNR)  Continuous     03/29/17 0902    Code Status History    Date Active Date Inactive Code Status Order ID Comments User Context   03/29/2017  8:34 AM 03/29/2017  9:02 AM Full Code [id number]  Rush Landmark, MD Inpatient   03/27/2017  8:07 PM 03/29/2017  8:34 AM Full Code [id number]  Karmen Bongo, MD Inpatient   03/03/2017 10:07 PM 03/05/2017 10:41 PM Full Code [id number]  Etta Quill, DO ED   02/17/2017  1:02 AM 02/23/2017 11:42 PM Full Code [id number]  Nilda Calamity, DO ED   02/12/2017  6:14 PM 02/13/2017 11:43 PM Full Code [id number]  Ulyses Amor, PA-C Inpatient   02/02/2017  3:19 AM 02/07/2017 10:34 PM Full Code [id number]  Rise Patience, MD Inpatient   07/15/2016  5:56 PM 07/22/2016  6:54 PM Full Code [id number]  Samella Parr, NP ED   05/06/2016  3:04 PM 05/12/2016  6:07 PM Full Code [id number]  Radene Gunning, NP ED   03/20/2016  3:49 PM 04/10/2016  8:11 PM Full Code [id number]  Rondel Jumbo, PA-C ED  Prognosis:   Unable to determine, but likely just days to weeks regardless of patient's decision to pursue or not to pursue hemodialysis. As noted, patient arrested in dialysis on 03/29/2017 and has had near syncopal episodes with almost every dialysis treatment over the past several months. Additionally, she has  dysphagia and is at high risk for aspiration pneumonia which would be an additional stressor. Anticipate that patient will not be able to take oral medications much longer  Discharge Planning:  To Be Determined  Care plan was discussed with Dr. Sheran Fava and CSW, Lorriane Shire  Thank you for allowing the Palliative Medicine Team to assist in the care of this patient.   Time In: 1500 Time Out: 1600 Total Time 60 min Prolonged Time Billed  no       Greater than 50%  of this time was spent counseling and coordinating care related to the above assessment and plan.  Dory Horn, NP  Please contact Palliative Medicine Team phone at [phone] for questions and concerns.

## 2017-03-30 NOTE — Progress Notes (Signed)
Scheduled nebs patient refuse at this time. PRN neb will be given for SOB/Wheezing. No distress/wheezing or increase WOB/SOB noted at this time. Pt has family at the the bedside. Told patient to notify staff if feels SOB.

## 2017-03-30 NOTE — Progress Notes (Signed)
Physical Therapy Discharge 03/30/17  Pt is being admitted to palliative care and will no longer receive HD. Family is choosing comfort care. Pt will sign off for now unless any other needs arise requiring continued services. Pt was at [facility] for mobility art previous evaluation. Lived at a SNF at baseline and coded in HD on 2017-04-25. Please re-order if any needs arise.   Colin Broach PT, DPT  [phone]

## 2017-03-30 NOTE — Progress Notes (Signed)
OT Cancellation Note and Discharge  Patient Details Name: Marvetta Bucknam MRN: [id number] DOB: 23-Nov-1951   Cancelled Treatment:    Reason Eval/Treat Not Completed: OT screened, no needs identified, will sign off. Per MD note today pt is comfort measures and no further dialysis. OT will sign off.  Evette Georges [phone] 03/30/2017, 4:12 PM

## 2017-03-30 NOTE — Care Management Important Message (Signed)
Important Message  Patient Details  Name: Brandy Dudley MRN: [id number] Date of Birth: Dec 28, 1950   Medicare Important Message Given:  Yes    Dorena Bodo 03/30/2017, 2:13 PM

## 2017-03-30 NOTE — Progress Notes (Addendum)
PROGRESS NOTE  Brandy Dudley  BJY:[id number] DOB: 1951/11/14 DOA: 03/27/2017 PCP: Blanchie Serve, MD  Brief Narrative:   Brandy Dudley a 66 y.o.femalewith medical history significant of CHF with EF 30%, AICD / pacer present, COPD, DM2 causing ESRD with dialysis TTS with recent admissions (4/7-9 for syncope - hypovolemic during dialysis; 3/23-3/26 for hypovolemic vs. Septic shock; 3/8-13 for acute cholecystitis with resultant placement of IR drain still in place) who presented after hypotension during dialysis today which caused the session to be shortened. She also had nausea, vomiting, and increased cough without fever or chills. In the emergency department, her lactate was greater than 2 which was likely due to hypotension. Her white blood cell count was 15.9 and chest x-ray was concerning for healthcare associated pneumonia. She was given vancomycin and cefepime.  Blood cultures are no growth to date. She clinically improved on her IV antibiotics but continued to have low normal to hypotensive blood pressures despite midodrine. On 5/3, after being on HD for 20 minutes, she developed respiratory arrest. A code blue was called. She was being supported with bag-mask ventilation when she went into PEA arrest. She had 2-3 minutes of chest compressions and 1 dose of epinephrine with ROSC. Post-event she had normal blood pressures but increased WOB with wheezes, rales, and increased oxygen requirement. Critical care was consulted and BIPAP was established prior to their arrival. She was intolerant of bipap and it was removed.  She was transferred to the ICU where she met with critical care and palliative care and discussed her options.  She elected to be DNR.  She was transferred back to telemetry under hospitalist service.  She has had syncope or near syncope with almost every HD session and now had cardiac arrest with the most recent.  Given the stress on her body from her recent cardiac arrest, it is  likely she could arrest again with another attempt at HD.  This afternoon, we had a family meeting with patient and two sisters, including her sister Brandy Dudley and another sister who works for hospice.  Patient and present family members are in agreement that it would be best to pursue comfort measures and avoid further hemodialysis.  Social work has been consulted.  Family preference would be for [facility], however, if bed is not available there, then she will go to available hospice facility as soon as bed available.     Assessment & Plan:   Principal Problem:   HCAP (healthcare-associated pneumonia) Active Problems:   ESRD on dialysis ([place])   Anemia of chronic renal failure, stage 5 (HCC)   Protein-calorie malnutrition, severe   Acute cholecystitis   Thrombocytopenia (HCC)   Respiratory failure (HCC)   Respiratory arrest ([place])   Goals of care, counseling/discussion   Palliative care by specialist   Cardiopulmonary arrest with successful resuscitation ([place])  Acute respiratory arrest followed by PEA.  ROSC after 2-3 minutes of chest compressions and one dose of epinephrine.   -  Now DNR and full comfort measures -  Will have cardiology deactivate ICD -  Appreciate palliative care and PCCM assistance   -  Continue oxygen via nasal canula for comfort  Minimally elevated troponins likely due to demand ischemia due to cardiac arrest -  No further work up at this time  PAF and VT s/p AICD -  D/c amiodarone to emphasize comfort (long half life) - Not a good candidate for systemic anticoagualtion  Chronic systolic heart failure, EF 35 - 40% with grade  2 DD - Continue volume management with HD - ECHO pending  Sepsis due to HCAP, with lactic acidosis, hypotension, leukocytosis. Leukocytosis resolved.  - Check MRSA PCR: Negative - Discontinue cefepime, emphasizing comfort - Sputum cultures - Blood cultures NGTD  Chronic hypotension - continue midodrine to 10 mg  TID  Cholecystitis, has perc drain in place since 3/9 -Does not appear to be the source of her infection  Severe protein calorie malnutrition, 35-lb weight loss over last 5 months - Liberalized diet  ESRD with renal insuffiency anemia -  Hgb 11.3, improved - attempted HD on 5/3 but she had cardiopulmonary arrest about 20 minutes into HD - recommended no further attempts at HD  Diabetes mellitus type 2with A1c 5, diet controlled. At risk for hypoglycemia  Thrombocytopenia, platelets trending down post arrest -  No further blood work  Recent history of C. Diff, oral vancomycin now complete  Hx of DVT,but no longer on anticoagulation as deemed high risk  COPD, continue 2L oxygen, spiriva, albuterol prn DVT prophylaxis: Lovenox  Code Status: DNR Family Communication:  Spoke with patient and her two sisters who were present at bedside this afternoon around 3:45PM to discuss [place] and decision to stop hemodialysis/pursue residential hospice.   Disposition Plan:  SW has placed patient on list for [facility], but family aware that it may be several days before bed becomes available there.  If a bed becomes available at another local residential hospice, she will be discharged there tomorrow instead.    Consultants:   Nephrology  Palliative care  PCCM  Procedures:  None  Antimicrobials:  Anti-infectives    Start     Dose/Rate Route Frequency Ordered Stop   03/29/17 1200  vancomycin (VANCOCIN) 500 mg in sodium chloride 0.9 % 100 mL IVPB  Status:  Discontinued     500 mg 100 mL/hr over 60 Minutes Intravenous Every T-Th-Sa (Hemodialysis) 03/27/17 1754 03/28/17 1028   03/27/17 1800  ceFEPIme (MAXIPIME) 2 g in dextrose 5 % 50 mL IVPB     2 g 100 mL/hr over 30 Minutes Intravenous  Once 03/27/17 1746 03/27/17 1916   03/27/17 1800  vancomycin (VANCOCIN) IVPB 1000 mg/200 mL premix     1,000 mg 200 mL/hr over 60 Minutes Intravenous  Once 03/27/17 1746 03/27/17 1916    03/27/17 1800  ceFEPIme (MAXIPIME) 2 g in dextrose 5 % 50 mL IVPB  Status:  Discontinued     2 g 100 mL/hr over 30 Minutes Intravenous Every T-Th-Sa (1800) 03/27/17 1754 03/30/17 1407       Subjective: Still having some soreness in her chest where she had chest compressions done yesterday. Denies shortness of breath, nausea. She states she feels scared. She is scared of dying. She is scared of trying hemodialysis again. She would like her family to be involved in decision making and they are arriving today.  >> returned for family meeting this afternoon.  Family are in agreement that we should pursue residential hospice.    Objective: Vitals:   03/29/17 1819 03/29/17 2027 03/30/17 0913 03/30/17 1428  BP: (!) 94/53 (!) 96/52 95/66   Pulse: 66 67 69   Resp: '20 18 16   ' Temp: 98 F (36.7 C) 98.2 F (36.8 C) 97.7 F (36.5 C)   TempSrc: Oral Oral Oral   SpO2: 99% 100% 100% 100%  Weight:      Height:        Intake/Output Summary (Last 24 hours) at 03/30/17 1610 Last data filed  at 03/30/17 1452  Gross per 24 hour  Intake              680 ml  Output              225 ml  Net              455 ml   Filed Weights   03/27/17 2023 03/28/17 1900 03/29/17 0720  Weight: 59.3 kg (130 lb 11.2 oz) 60.5 kg (133 lb 6.4 oz) 60.4 kg (133 lb 2.5 oz)    Examination:  General exam:Cachectic adult female, severe bitemporal wasting, no acute distress HEENT:NCAT, MMM Respiratory system: Coarse rales bilateral bases, no wheezes or rhonchi Cardiovascular OYDXAJ:OINO6/7 systolic murmur. Warm extremities.  Gastrointestinal system:Normal active bowel sounds, soft, nondistended, nontender. EHM:CNOBSJ tone and bulk, no lower extremity edema Neuro: Grossly moving all extremities Psych: Alert and oriented, somewhat tearful but trying to stay positive    Data Reviewed: I have personally reviewed following labs and imaging studies  CBC:  Recent Labs Lab 03/27/17 1639 03/28/17 0656  03/29/17 0750 03/29/17 0946 03/30/17 0441  WBC 15.9* 8.3 10.6* 13.8* 8.0  NEUTROABS 14.0* 7.5  --   --   --   HGB 11.3* 10.6* 10.9* 10.5* 11.3*  HCT 37.5 34.8* 36.7 34.7* 38.1  MCV 83.3 82.7 83.8 82.2 84.3  PLT 109* 101* 111* 77* 59*   Basic Metabolic Panel:  Recent Labs Lab 03/27/17 1639 03/28/17 0656 03/29/17 0750 03/29/17 0946 03/30/17 0441  NA 135 136 137  --  138  K 4.0 4.8 4.9  --  4.4  CL 96* 99* 99*  --  99*  CO2 20* 19* 23  --  20*  GLUCOSE 79 224* 107*  --  89  BUN 39* 45* 62*  --  61*  CREATININE 6.88* 7.35* 8.15*  --  7.92*  CALCIUM 8.1* 8.3* 8.4*  --  8.2*  MG  --   --   --  2.1 2.2  PHOS  --   --  8.2* 6.9* 8.0*   GFR: Estimated Creatinine Clearance: 6 mL/min (A) (by C-G formula based on SCr of 7.92 mg/dL (H)). Liver Function Tests:  Recent Labs Lab 03/27/17 1639 03/29/17 0750 03/30/17 0441  AST 26  --   --   ALT 12*  --   --   ALKPHOS 145*  --   --   BILITOT 1.4*  --   --   PROT 8.6*  --   --   ALBUMIN 2.6* 2.3* 2.3*   No results for input(s): LIPASE, AMYLASE in the last 168 hours. No results for input(s): AMMONIA in the last 168 hours. Coagulation Profile:  Recent Labs Lab 03/27/17 2042  INR 1.44   Cardiac Enzymes:  Recent Labs Lab 03/29/17 0946 03/29/17 1446 03/29/17 1952  TROPONINI 0.08* 0.12* 0.18*   BNP (last 3 results) No results for input(s): PROBNP in the last 8760 hours. HbA1C: No results for input(s): HGBA1C in the last 72 hours. CBG:  Recent Labs Lab 03/29/17 1201 03/29/17 1611 03/29/17 2125 03/30/17 0802 03/30/17 1216  GLUCAP 141* 95 113* 105* 129*   Lipid Profile: No results for input(s): CHOL, HDL, LDLCALC, TRIG, CHOLHDL, LDLDIRECT in the last 72 hours. Thyroid Function Tests: No results for input(s): TSH, T4TOTAL, FREET4, T3FREE, THYROIDAB in the last 72 hours. Anemia Panel: No results for input(s): VITAMINB12, FOLATE, FERRITIN, TIBC, IRON, RETICCTPCT in the last 72 hours. Urine analysis:      Component Value Date/Time  COLORURINE AMBER (A) 07/15/2016 1636   APPEARANCEUR TURBID (A) 07/15/2016 1636   LABSPEC 1.025 07/15/2016 1636   PHURINE 5.0 07/15/2016 1636   GLUCOSEU NEGATIVE 07/15/2016 1636   HGBUR MODERATE (A) 07/15/2016 1636   BILIRUBINUR MODERATE (A) 07/15/2016 1636   KETONESUR 15 (A) 07/15/2016 1636   PROTEINUR 100 (A) 07/15/2016 1636   NITRITE NEGATIVE 07/15/2016 1636   LEUKOCYTESUR MODERATE (A) 07/15/2016 1636   Sepsis Labs: '@LABRCNTIP' (procalcitonin:4,lacticidven:4)  ) Recent Results (from the past 240 hour(s))  Blood Culture (routine x 2)     Status: None (Preliminary result)   Collection Time: 03/27/17  6:01 PM  Result Value Ref Range Status   Specimen Description BLOOD LEFT ANTECUBITAL  Final   Special Requests IN PEDIATRIC BOTTLE Blood Culture adequate volume  Final   Culture NO GROWTH 3 DAYS  Final   Report Status PENDING  Incomplete  Blood Culture (routine x 2)     Status: None (Preliminary result)   Collection Time: 03/27/17  6:11 PM  Result Value Ref Range Status   Specimen Description BLOOD LEFT HAND  Final   Special Requests   Final    BOTTLES DRAWN AEROBIC ONLY Blood Culture adequate volume   Culture NO GROWTH 3 DAYS  Final   Report Status PENDING  Incomplete  MRSA PCR Screening     Status: None   Collection Time: 03/28/17  7:42 AM  Result Value Ref Range Status   MRSA by PCR NEGATIVE NEGATIVE Final    Comment:        The GeneXpert MRSA Assay (FDA approved for NASAL specimens only), is one component of a comprehensive MRSA colonization surveillance program. It is not intended to diagnose MRSA infection nor to guide or monitor treatment for MRSA infections.       Radiology Studies: Dg Chest Port 1 View  Result Date: 03/30/2017 CLINICAL DATA:  Respiratory failure with cough. EXAM: PORTABLE CHEST 1 VIEW COMPARISON:  03/29/2017. FINDINGS: The markedly enlarged cardiac silhouette. Unchanged single lead pacer. Mild vascular congestion.  No frank edema or consolidation. Previously noted area of early consolidation or atelectasis RIGHT upper lobe appears improved. IMPRESSION: Improved aeration.  Marked cardiomegaly. Electronically Signed   By: Staci Righter M.D.   On: 03/30/2017 07:28   Dg Chest Port 1 View  Result Date: 03/29/2017 CLINICAL DATA:  Respiratory arrest in dialysis. Hx of AICD, cardiomyopathy, CHF, COPD, CAD, diabetes, hypertension, AV fistula placement. Former smoker(2007). EXAM: PORTABLE CHEST 1 VIEW COMPARISON:  03/27/2017 FINDINGS: In the right upper lobe, anteriorly adjacent to the minor fissure, there is focal hazy airspace opacity which has developed since the prior exam. Remainder of the lungs is clear. Mild to moderate enlargement of the cardiopericardial silhouette is stable. No mediastinal or hilar masses. Stable left anterior chest wall pacemaker. Right internal jugular tunneled dual lumen central venous line is also stable. No pneumothorax. IMPRESSION: 1. Small area of new airspace opacity in the right upper lobe. This is consistent with pneumonia in the proper clinical setting. Alternatively, may reflect atelectasis. 2. No other evidence of acute cardiopulmonary disease. Electronically Signed   By: Lajean Manes M.D.   On: 03/29/2017 09:22     Scheduled Meds: . budesonide (PULMICORT) nebulizer solution  0.5 mg Nebulization BID  . famotidine  20 mg Oral QHS  . heparin  6,000 Units Dialysis Once in dialysis  . hydroxypropyl methylcellulose / hypromellose  1 drop Both Eyes BID  . insulin aspart  0-9 Units Subcutaneous TID WC  . ipratropium-albuterol  3 mL Nebulization TID  . mirtazapine  7.5 mg Oral QHS  . sertraline  25 mg Oral QHS   Continuous Infusions:    LOS: 3 days    Time spent: 30 min    Janece Canterbury, MD [facility] Pager [phone]  If 7PM-7AM, please contact night-coverage [email or web address] Password [email or web address] 03/30/2017, 4:10 PM

## 2017-03-30 NOTE — Progress Notes (Signed)
[facility] Progress Note   Subjective:  Seen in room. She is upbeat and very talkative today, sharing many stories from her youth. + chest soreness today attributed to CPR during PEA arrest yesterday. No dyspnea or new symptoms. Confirmed her DNR status. Regarding dialysis, we discussed continuing versus stopping dialysis. She understands that she may die either way, says "it is not really up to me, it is God's plan." Sounds like nearly all of her family will be coming to visit today and tomorrow to help with her decision.   Objective Vitals:   03/29/17 1621 03/29/17 1819 03/29/17 2027 03/30/17 0913  BP:  (!) 94/53 (!) 96/52 95/66  Pulse:  66 67 69  Resp:  20 18 16   Temp: 98 F (36.7 C) 98 F (36.7 C) 98.2 F (36.8 C) 97.7 F (36.5 C)  TempSrc: Oral Oral Oral Oral  SpO2: 100% 99% 100% 100%  Weight:      Height:       Physical Exam General: Animated, talkative today. NAD. On nasal oxygen. Heart: RRR; 2/6 systolic murmur Lungs: Expiratory wheezing, rhonchi throughout Abdomen: soft, non-tender Extremities: trace LE edema, B cushion boots present Dialysis Access: TDC in R chest (L thigh AVG maturing)  Additional Objective Labs: Basic Metabolic Panel:  Recent Labs Lab 03/28/17 0656 03/29/17 0750 03/29/17 0946 03/30/17 0441  NA 136 137  --  138  K 4.8 4.9  --  4.4  CL 99* 99*  --  99*  CO2 19* 23  --  20*  GLUCOSE 224* 107*  --  89  BUN 45* 62*  --  61*  CREATININE 7.35* 8.15*  --  7.92*  CALCIUM 8.3* 8.4*  --  8.2*  PHOS  --  8.2* 6.9* 8.0*   Liver Function Tests:  Recent Labs Lab 03/27/17 1639 03/29/17 0750 03/30/17 0441  AST 26  --   --   ALT 12*  --   --   ALKPHOS 145*  --   --   BILITOT 1.4*  --   --   PROT 8.6*  --   --   ALBUMIN 2.6* 2.3* 2.3*   CBC:  Recent Labs Lab 03/27/17 1639 03/28/17 0656 03/29/17 0750 03/29/17 0946 03/30/17 0441  WBC 15.9* 8.3 10.6* 13.8* 8.0  NEUTROABS 14.0* 7.5  --   --   --   HGB 11.3* 10.6* 10.9*  10.5* 11.3*  HCT 37.5 34.8* 36.7 34.7* 38.1  MCV 83.3 82.7 83.8 82.2 84.3  PLT 109* 101* 111* 77* 59*   Cardiac Enzymes:  Recent Labs Lab 03/29/17 0946 03/29/17 1446 03/29/17 1952  TROPONINI 0.08* 0.12* 0.18*   CBG:  Recent Labs Lab 03/29/17 0832 03/29/17 1201 03/29/17 1611 03/29/17 2125 03/30/17 0802  GLUCAP 143* 141* 95 113* 105*   Studies/Results: Dg Chest Port 1 View  Result Date: 03/30/2017 CLINICAL DATA:  Respiratory failure with cough. EXAM: PORTABLE CHEST 1 VIEW COMPARISON:  03/29/2017. FINDINGS: The markedly enlarged cardiac silhouette. Unchanged single lead pacer. Mild vascular congestion. No frank edema or consolidation. Previously noted area of early consolidation or atelectasis RIGHT upper lobe appears improved. IMPRESSION: Improved aeration.  Marked cardiomegaly. Electronically Signed   By: Elsie Stain M.D.   On: 03/30/2017 07:28   Dg Chest Port 1 View  Result Date: 03/29/2017 CLINICAL DATA:  Respiratory arrest in dialysis. Hx of AICD, cardiomyopathy, CHF, COPD, CAD, diabetes, hypertension, AV fistula placement. Former smoker(2007). EXAM: PORTABLE CHEST 1 VIEW COMPARISON:  03/27/2017 FINDINGS: In the right upper  lobe, anteriorly adjacent to the minor fissure, there is focal hazy airspace opacity which has developed since the prior exam. Remainder of the lungs is clear. Mild to moderate enlargement of the cardiopericardial silhouette is stable. No mediastinal or hilar masses. Stable left anterior chest wall pacemaker. Right internal jugular tunneled dual lumen central venous line is also stable. No pneumothorax. IMPRESSION: 1. Small area of new airspace opacity in the right upper lobe. This is consistent with pneumonia in the proper clinical setting. Alternatively, may reflect atelectasis. 2. No other evidence of acute cardiopulmonary disease. Electronically Signed   By: Amie Portland M.D.   On: 03/29/2017 09:22   Medications: . sodium chloride    . sodium chloride     . ceFEPime (MAXIPIME) IV Stopped (03/29/17 1759)   . amiodarone  200 mg Oral Daily  . atorvastatin  10 mg Oral QHS  . budesonide (PULMICORT) nebulizer solution  0.5 mg Nebulization BID  . enoxaparin (LOVENOX) injection  30 mg Subcutaneous Q24H  . famotidine  20 mg Oral QHS  . heparin  6,000 Units Dialysis Once in dialysis  . hydroxypropyl methylcellulose / hypromellose  1 drop Both Eyes BID  . insulin aspart  0-9 Units Subcutaneous TID WC  . ipratropium-albuterol  3 mL Nebulization TID  . lactose free nutrition  237 mL Oral BID BM  . midodrine  15 mg Oral TID WC  . mirtazapine  7.5 mg Oral QHS  . multivitamin  1 tablet Oral QHS  . sertraline  25 mg Oral QHS    Dialysis Orders: [place] 4 hrs 180 NRe 60kg  400/800 3.0 K/2.5 Ca UF profile 2 Heparin 6000 units IV TIW Mircera 150 mcg IV q 2 weeks (last dose 03/27/17 HGB 10.7 WBC 11.3Ferritin 1374 Fe 57 Tsat 24% 03/22/17) No VDRA  Assessment/Plan: 1. PEA arrest on 5/3 (ROSC after CPR/Epi): Now DNR status.  2. ESRD: Technically due for dialysis again tomorrow, she had minimal HD prior to arrest yesterday. Ongoing discussions about stopping HD. Many family members will be visiting between today and tomorrow to help patient with decision. Palliative care involved. No orders placed yet for HD as of now. 3. HTN/volume:  BP low, stable. On Midodrine TID. 4. Anemia: Hgb 11.3. No ESA yet. 5. Secondary hyperparathyroidism: Ca ok, Phos high. Awaiting decision prior to adding another med (binder). 6. Nutrition: Alb very low. 7. EOL care: Palliative care involved. Now DNR. She is upbeat and talkative today, looking forward to discussions with her family re: stopping dialysis.  Ozzie Hoyle, PA-C 03/30/2017, 9:33 AM  [facility] Pager: [phone]

## 2017-03-31 LAB — GLUCOSE, CAPILLARY
GLUCOSE-CAPILLARY: 145 mg/dL — AB (ref 65–99)
Glucose-Capillary: 136 mg/dL — ABNORMAL HIGH (ref 65–99)
Glucose-Capillary: 174 mg/dL — ABNORMAL HIGH (ref 65–99)
Glucose-Capillary: 115 mg/dL — ABNORMAL HIGH (ref 65–99)

## 2017-03-31 MED ORDER — GUAIFENESIN-DM 100-10 MG/5ML PO SYRP: 5.0000 mL | ORAL_SOLUTION | ORAL | 0 refills | Status: AC | PRN

## 2017-03-31 MED ORDER — GUAIFENESIN-DM 100-10 MG/5ML PO SYRP
5.0000 mL | ORAL_SOLUTION | ORAL | Status: DC | PRN
  Administered 2017-03-31 – 2017-04-01 (×2): 5 mL via ORAL
  Filled 2017-03-31 (×3): qty 5

## 2017-03-31 NOTE — Clinical Social Work Note (Signed)
CSW notified pt/family have decided on comfort care and want pt to go to [facility].   Debbie @ [facility] notified [phone] that pt has selected facility. Debbie reports possible bed availability this evening. CSW spoke to pt's sister who also reported an upcoming family meeting with palliative care @ 4PM. CSW unclear about meetings goals but will continue to follow to coordinate transfer to hospice facility.  Doug Bucklin B. Gean Quint Clinical Social Work Dept Weekend Social Worker [phone] 3:27 PM

## 2017-03-31 NOTE — Plan of Care (Signed)
Problem: Education: Goal: Knowledge of [facility] Education information/materials will improve Outcome: Progressing POC reviewed with pt.   

## 2017-03-31 NOTE — Progress Notes (Signed)
Original family mtg set for 4 pm but later moved to 5 pm secondary to family delays. When I arrived to the room, part of family arrived at 530 pm . Other family will not be in town until between 7pm and 11 pm I was told. Pt's SIL, [name], arrived at 530 pm and informed me she has called the director of [facility] and that transfer has been rescheduled for 04/01/17 at 1330. It is not clear if this delay was facilitated by family and the delays encountered by out of town family members or [facility] unable to do a late admission. Will meet with entire family hopefully at 1300 to be available to answer questions before transport. Informed Dr. [name] as well as bedside RN. I have told family that arrived at 530 pm that HD was being stopped. These family members were aware of this.  Eduard Roux, ANP

## 2017-03-31 NOTE — Progress Notes (Signed)
[facility] Progress Note   Subjective: "I can't go thru what happened the other day anymore". Pleasant, awake, no C/O.  [place] Says she will let us know what she decides about HD.  Objective Vitals:   03/30/17 2149 03/31/17 0436 03/31/17 0928 03/31/17 0943  BP: (!) 92/55 (!) 92/50 113/79   Pulse: 66 82 70   Resp: 18 18 17    Temp: 98 F (36.7 C) 97.4 F (36.3 C) 97.8 F (36.6 C)   TempSrc: Oral Oral Oral   SpO2: 100% 100% 98% 97%  Weight: 65 kg (143 lb 4.8 oz)     Height:       Physical Exam General: Pleasant, frail appearing female in NAD Heart: 2/6 systolic M Lungs: CTAB Abdomen: Active BS, Non-tender Extremities: No LE edema Dialysis Access: [facility] Drsg CDI/L thigh AVG + bruit   Additional Objective Labs: Basic Metabolic Panel:  Recent Labs Lab 03/28/17 0656 03/29/17 0750 03/29/17 0946 03/30/17 0441  NA 136 137  --  138  K 4.8 4.9  --  4.4  CL 99* 99*  --  99*  CO2 19* 23  --  20*  GLUCOSE 224* 107*  --  89  BUN 45* 62*  --  61*  CREATININE 7.35* 8.15*  --  7.92*  CALCIUM 8.3* 8.4*  --  8.2*  PHOS  --  8.2* 6.9* 8.0*   Liver Function Tests:  Recent Labs Lab 03/27/17 1639 03/29/17 0750 03/30/17 0441  AST 26  --   --   ALT 12*  --   --   ALKPHOS 145*  --   --   BILITOT 1.4*  --   --   PROT 8.6*  --   --   ALBUMIN 2.6* 2.3* 2.3*   CBC:  Recent Labs Lab 03/27/17 1639 03/28/17 0656 03/29/17 0750 03/29/17 0946 03/30/17 0441  WBC 15.9* 8.3 10.6* 13.8* 8.0  NEUTROABS 14.0* 7.5  --   --   --   HGB 11.3* 10.6* 10.9* 10.5* 11.3*  HCT 37.5 34.8* 36.7 34.7* 38.1  MCV 83.3 82.7 83.8 82.2 84.3  PLT 109* 101* 111* 77* 59*   Blood Culture    Component Value Date/Time   SDES BLOOD LEFT HAND 03/27/2017 1811   SPECREQUEST  03/27/2017 1811    BOTTLES DRAWN AEROBIC ONLY Blood Culture adequate volume   CULT NO GROWTH 3 DAYS 03/27/2017 1811   REPTSTATUS PENDING 03/27/2017 1811    Cardiac  Enzymes:  Recent Labs Lab 03/29/17 0946 03/29/17 1446 03/29/17 1952  TROPONINI 0.08* 0.12* 0.18*   CBG:  Recent Labs Lab 03/30/17 0802 03/30/17 1216 03/30/17 1655 03/30/17 2146 03/31/17 0757  GLUCAP 105* 129* 114* 122* 136*   Studies/Results: Dg Chest Port 1 View  Result Date: 03/30/2017 CLINICAL DATA:  Respiratory failure with cough. EXAM: PORTABLE CHEST 1 VIEW COMPARISON:  03/29/2017. FINDINGS: The markedly enlarged cardiac silhouette. Unchanged single lead pacer. Mild vascular congestion. No frank edema or consolidation. Previously noted area of early consolidation or atelectasis RIGHT upper lobe appears improved. IMPRESSION: Improved aeration.  Marked cardiomegaly. Electronically Signed   By: Elsie Stain M.D.   On: 03/30/2017 07:28   Medications:  . budesonide (PULMICORT) nebulizer solution  0.5 mg Nebulization BID  . famotidine  20 mg Oral QHS  . heparin  6,000 Units Dialysis Once in dialysis  . hydroxypropyl methylcellulose / hypromellose  1 drop Both Eyes BID  . insulin aspart  0-9 Units Subcutaneous  TID WC  . ipratropium-albuterol  3 mL Nebulization TID  . mirtazapine  7.5 mg Oral QHS  . sertraline  25 mg Oral QHS    Dialysis Orders: [place] 4 hrs 180 NRe 60kg  400/800 3.0 K/2.5 Ca UF profile 2 Heparin 6000 units IV TIW Mircera 150 mcg IV q 2 weeks (last dose 03/27/17 HGB 10.7 WBC 11.3Ferritin 1374 Fe 57 Tsat 24% 03/22/17) No VDRA  Assessment/Plan: 1. PEA arrest on 5/3 (ROSC after CPR/Epi): Now DNR status. Full comfort care. Family meeting being held today.  HD on hold.   2. ESRD: Holding HD pending EOL decisions. 3. HTN/volume:  BP low, stable. On Midodrine TID. 4. Anemia: Hgb 11.3. No ESA yet. 5. Secondary hyperparathyroidism: Ca ok, Phos high. Awaiting decision prior to adding another med (binder). 6. Nutrition: Alb very low. 7. EOL care: Palliative care involved.  8. H/O PAF: No coumadin. Has AICD which has been deactivated.  9. HCAP: ABX  stopped. Comfort care.   Brandy H. Brown NP-C 03/31/2017, 9:58 AM  [facility] [phone]  Pt seen, examined and agree w A/P as above.  Brandy Moselle MD [employer] pager [phone]   03/31/2017, 1:48 PM

## 2017-03-31 NOTE — Progress Notes (Signed)
Note pt is now full comfort care and plan is for d/c to hospice home, no further dialysis.  Appreciate care of [facility] team and palliative care. Will sign off.   Vinson Moselle MD [employer] pgr [phone]   03/31/2017, 4:16 PM

## 2017-04-01 LAB — CULTURE, BLOOD (ROUTINE X 2)
Culture: NO GROWTH
Culture: NO GROWTH
Special Requests: ADEQUATE
Special Requests: ADEQUATE

## 2017-04-01 LAB — GLUCOSE, CAPILLARY
GLUCOSE-CAPILLARY: 101 mg/dL — AB (ref 65–99)
GLUCOSE-CAPILLARY: 213 mg/dL — AB (ref 65–99)

## 2017-04-01 NOTE — Clinical Social Work Note (Addendum)
MSW FU with [facility], spoke to [place], ok to send pt @ 1PM. [facility] transport set for 1PM, family updated.  Jusitn Salsgiver B. Gean Quint Clinical Social Work Dept Weekend Social Worker [phone] 9:19 AM

## 2017-04-01 NOTE — Progress Notes (Signed)
Report given to Debbie at [facility]. Bess Kinds, RN

## 2017-04-01 NOTE — Plan of Care (Signed)
Problem: Fluid Volume: Goal: Ability to maintain a balanced intake and output will improve Outcome: Adequate for Discharge Patient to discharge to [facility] today

## 2017-04-01 NOTE — Clinical Social Work Note (Signed)
Clinical Social Worker facilitated patient discharge including contacting patient family ([name]) and facility [facility]) to confirm patient discharge plans. Clinical information faxed to facility and family agreeable with plan. CSW arranged ambulance transport via [facility] to [facility]. RN to call report prior to discharge.  Clinical Social Worker will sign off for now as social work intervention is no longer needed. Please consult [place] again if new need arises.  Melchizedek Espinola B. Gean Quint Clinical Social Work Dept Weekend Social Worker [phone] 9:22 AM

## 2017-04-01 NOTE — Progress Notes (Signed)
[facility] to transport patient to [facility]. Telephone call to [facility] to advise of patient being picked up at this time. Bess Kinds, RN

## 2017-04-01 NOTE — Progress Notes (Signed)
Patient seen and examined.  No change to discharge medication list or instructions.  Safe to transfer to residential hospice today.

## 2017-04-27 DEATH — deceased

## 2017-05-11 ENCOUNTER — Encounter: Payer: Self-pay | Admitting: Physician Assistant

## 2017-06-05 ENCOUNTER — Encounter: Payer: Medicare PPO | Admitting: Internal Medicine

## 2018-06-13 IMAGING — CT CT ABD-PELV W/O CM
2 of 4 series · 13 of 46 positions shown, 15 images · non-contrast
Comparison: CT abdomen/ pelvis 07/06/2016

CLINICAL DATA: Right upper quadrant pain and nausea

EXAM:
CT ABDOMEN AND PELVIS WITHOUT CONTRAST
TECHNIQUE: Multidetector CT imaging of the abdomen and pelvis was performed
following the standard protocol without IV contrast.

[Series 201: routine, idose (2) · axial · 0.66mm/px · z∈[-553,-203]mm · 10 of 86 slices shown, 12 images]
[im 8/86  soft-tissue]
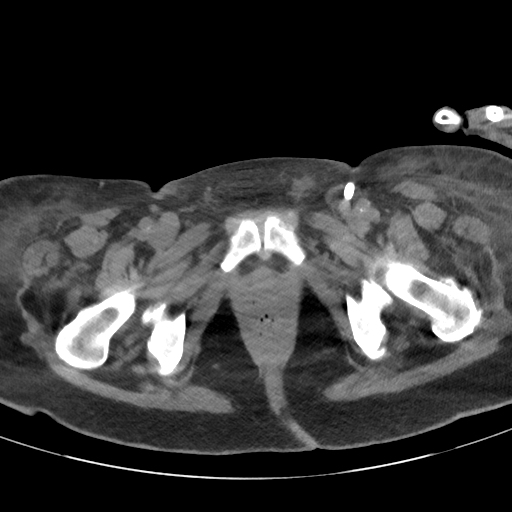
[im 8/86  bone]
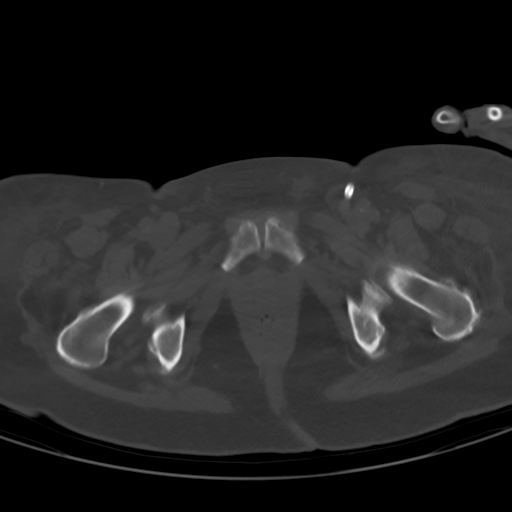
[im 15/86  soft-tissue]
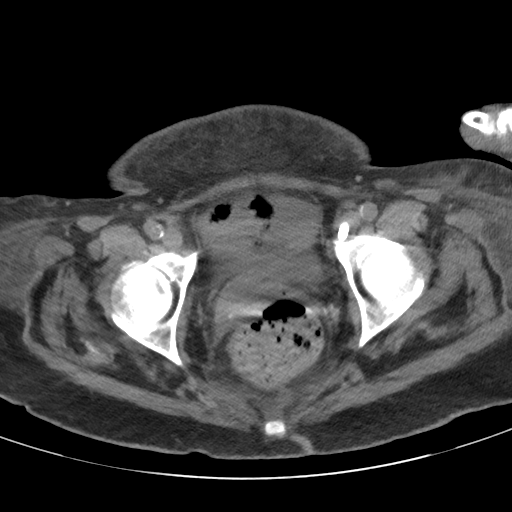
[im 23/86  soft-tissue]
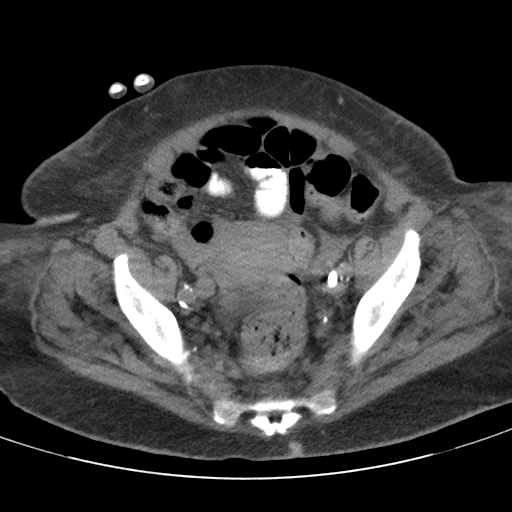
[im 30/86  soft-tissue]
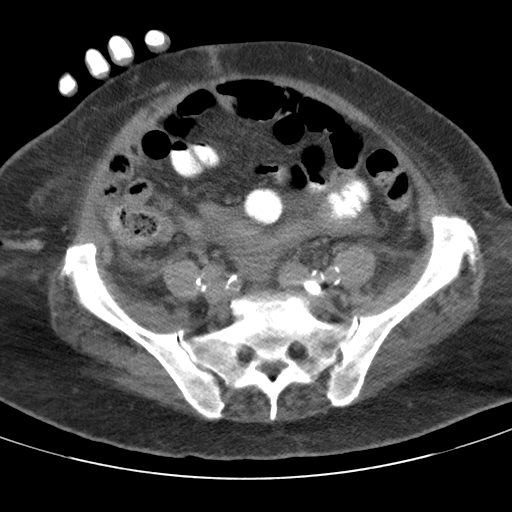
[im 37/86  soft-tissue]
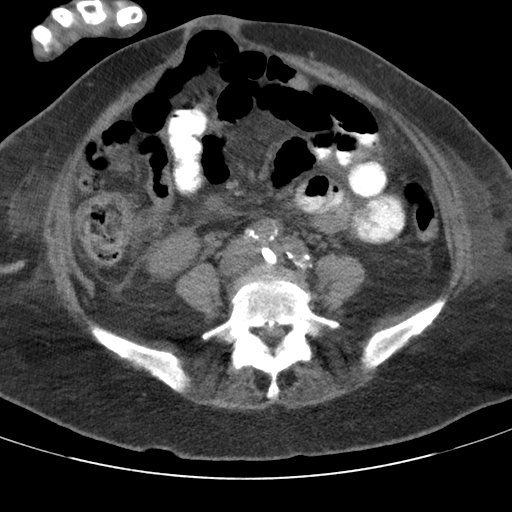
[im 49/86  soft-tissue]
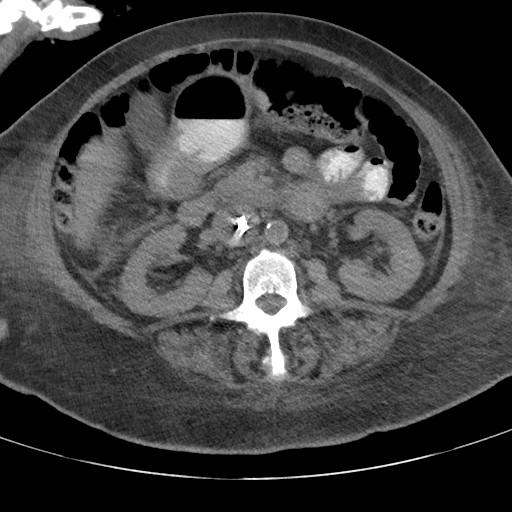
[im 56/86  soft-tissue]
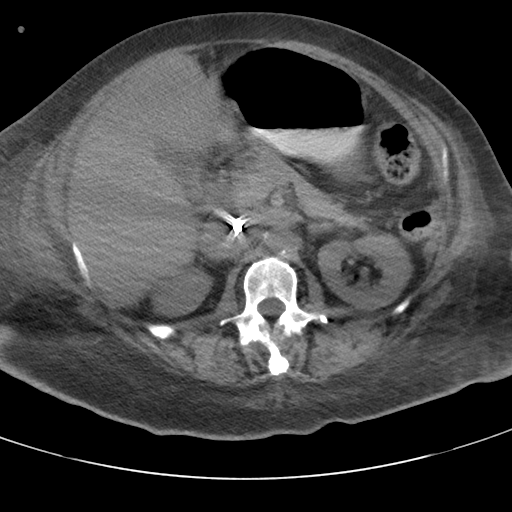
[im 63/86  soft-tissue]
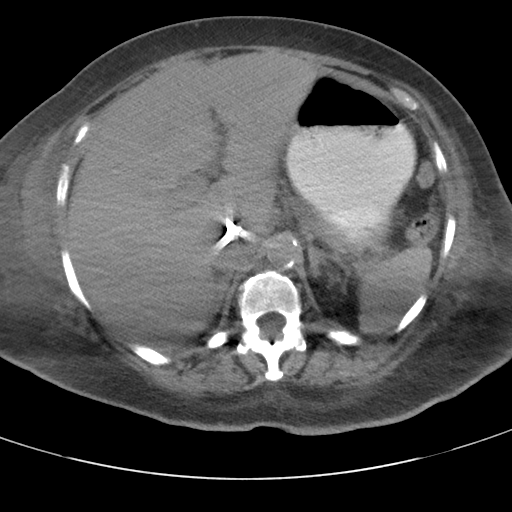
[im 71/86  soft-tissue]
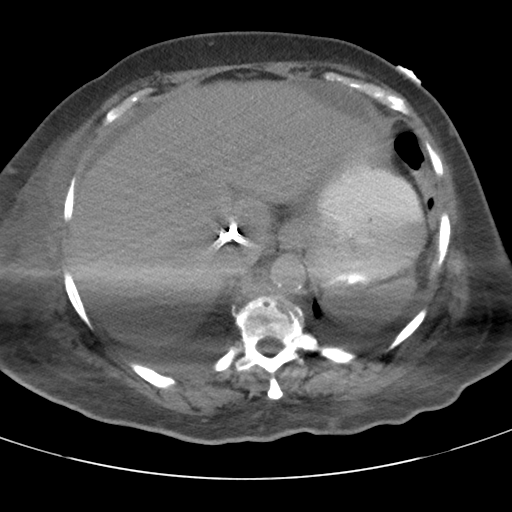
[im 71/86  bone]
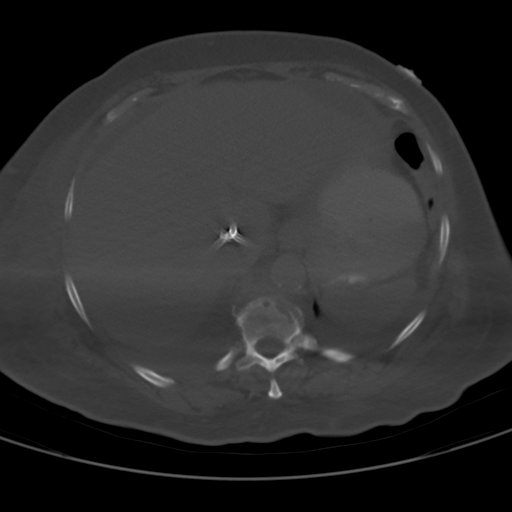
[im 78/86  soft-tissue]
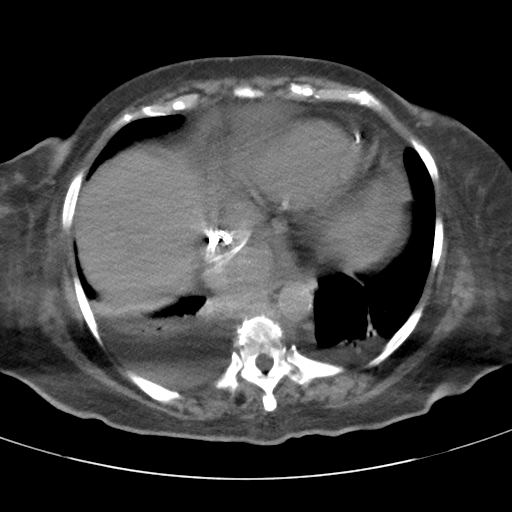

[Series 203: coronals, idose (2) · coronal · 0.45mm/px · 3 of 135 slices shown]
[im 45/135  soft-tissue]
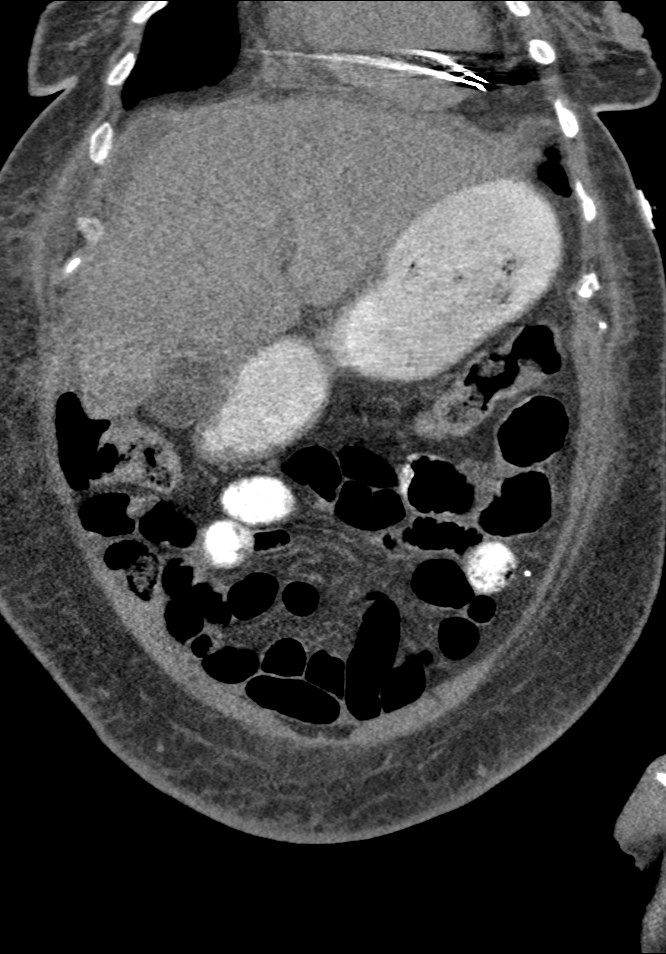
[im 60/135  soft-tissue]
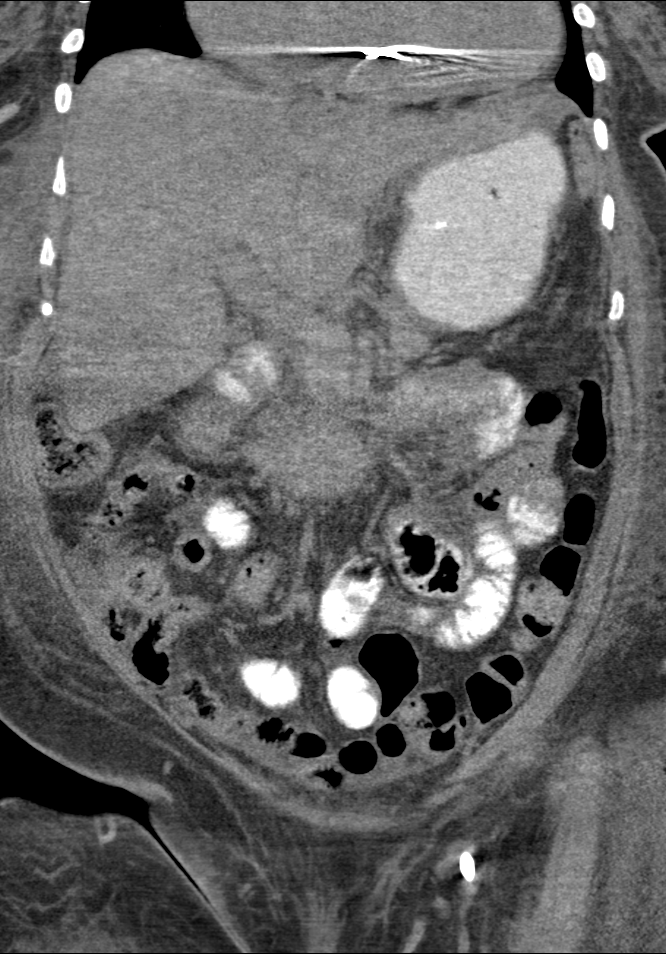
[im 75/135  soft-tissue]
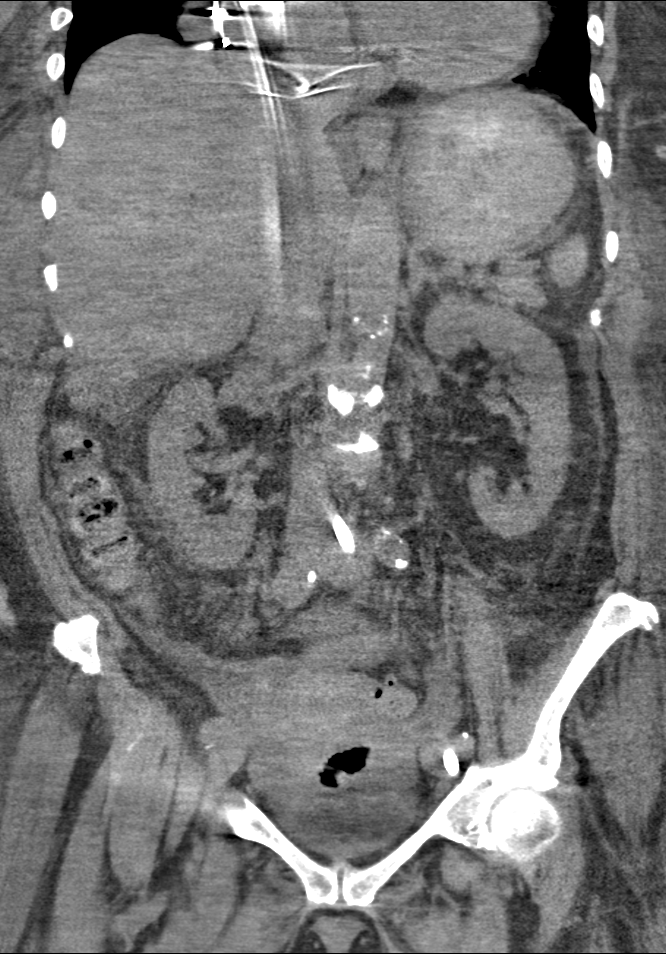

[13 of 46 positions shown; findings below may reference images not displayed]

FINDINGS: The examination is degraded by motion and the lack of intravenous
contrast.

Lower chest: There is massive cardiomegaly. Moderate right and small
left pleural effusions. No pulmonary nodules are identified. There
is a calcified granuloma in the left lung base. Bilateral basilar
atelectasis. Left femoral approach dialysis catheter terminates
beyond the field of view.

Hepatobiliary: The liver is enlarged with small volume perihepatic
ascites. No liver lesions identified on this noncontrast study.
Gallbladder appears normal.

Pancreas: Normal unenhanced appearance of the pancreas. No
pancreatic ductal dilatation. No peripancreatic fluid collection.

Spleen: Normal spleen.

Adrenals/Urinary Tract: Adrenal glands are normal. No
hydronephrosis. No nephrolithiasis. Renal contours are normal.
Urinary bladder is incompletely distended.

Stomach/Bowel: No dilated small bowel or other evidence of
obstruction. No enteric or colonic inflammation. The appendix is not
clearly visualized. The stomach is moderately distended.
Fat-containing ventral abdominal hernia.

Vascular/Lymphatic: There is atherosclerotic calcification within
the abdominal aorta and its branch vessels. There is a left femoral
vein approach central venous catheter that terminates beyond the
field of view.

Reproductive: There is fluid within the endometrial cavity. The
ovaries are unremarkable.

Other: Diffuse anasarca.

Musculoskeletal: Multilevel facet arthrosis and osteophytosis.
IMPRESSION: 1. No acute intra-abdominal process. No evidence of pseudomembranous
colitis.
2. Hepatomegaly small volume perihepatic ascites.
3. Massive cardiomegaly with bilateral pleural effusions,
right-greater-than-left, which worsened prior study.
4. Aortic atherosclerosis.
